# Patient Record
Sex: Female | Born: 1965 | Race: White | Hispanic: No | Marital: Married | Smoking: Current every day smoker
Health system: Southern US, Community
[De-identification: ages and names within clinical notes are randomized; demographics above are authoritative.]

## PROBLEM LIST (undated history)

## (undated) DIAGNOSIS — F32A Depression, unspecified: Secondary | ICD-10-CM

## (undated) DIAGNOSIS — K589 Irritable bowel syndrome without diarrhea: Secondary | ICD-10-CM

## (undated) DIAGNOSIS — E119 Type 2 diabetes mellitus without complications: Secondary | ICD-10-CM

## (undated) DIAGNOSIS — K2210 Ulcer of esophagus without bleeding: Secondary | ICD-10-CM

## (undated) DIAGNOSIS — D126 Benign neoplasm of colon, unspecified: Secondary | ICD-10-CM

## (undated) DIAGNOSIS — Z8719 Personal history of other diseases of the digestive system: Secondary | ICD-10-CM

## (undated) DIAGNOSIS — Z8711 Personal history of peptic ulcer disease: Secondary | ICD-10-CM

## (undated) DIAGNOSIS — K52839 Microscopic colitis, unspecified: Secondary | ICD-10-CM

## (undated) DIAGNOSIS — F329 Major depressive disorder, single episode, unspecified: Secondary | ICD-10-CM

## (undated) DIAGNOSIS — K219 Gastro-esophageal reflux disease without esophagitis: Secondary | ICD-10-CM

## (undated) HISTORY — DX: Personal history of other diseases of the digestive system: Z87.19

## (undated) HISTORY — DX: Irritable bowel syndrome, unspecified: K58.9

## (undated) HISTORY — PX: TUBAL LIGATION: SHX77

## (undated) HISTORY — DX: Ulcer of esophagus without bleeding: K22.10

## (undated) HISTORY — DX: Depression, unspecified: F32.A

## (undated) HISTORY — DX: Benign neoplasm of colon, unspecified: D12.6

## (undated) HISTORY — DX: Major depressive disorder, single episode, unspecified: F32.9

## (undated) HISTORY — PX: TONSILLECTOMY: SUR1361

## (undated) HISTORY — DX: Microscopic colitis, unspecified: K52.839

## (undated) HISTORY — PX: ABDOMINAL HYSTERECTOMY: SHX81

## (undated) HISTORY — DX: Gastro-esophageal reflux disease without esophagitis: K21.9

## (undated) HISTORY — DX: Personal history of peptic ulcer disease: Z87.11

---

## 2000-06-20 ENCOUNTER — Ambulatory Visit (HOSPITAL_COMMUNITY): Admission: RE | Admit: 2000-06-20 | Discharge: 2000-06-20 | Payer: Self-pay | Admitting: *Deleted

## 2002-06-23 ENCOUNTER — Ambulatory Visit (HOSPITAL_COMMUNITY): Admission: RE | Admit: 2002-06-23 | Discharge: 2002-06-23 | Payer: Self-pay | Admitting: Family Medicine

## 2002-06-23 ENCOUNTER — Encounter: Payer: Self-pay | Admitting: Family Medicine

## 2004-10-05 ENCOUNTER — Ambulatory Visit (HOSPITAL_COMMUNITY): Admission: RE | Admit: 2004-10-05 | Discharge: 2004-10-05 | Payer: Self-pay | Admitting: Obstetrics and Gynecology

## 2004-10-05 ENCOUNTER — Encounter: Payer: Self-pay | Admitting: Obstetrics and Gynecology

## 2005-05-24 ENCOUNTER — Ambulatory Visit (HOSPITAL_COMMUNITY): Admission: RE | Admit: 2005-05-24 | Discharge: 2005-05-24 | Payer: Self-pay | Admitting: Family Medicine

## 2005-09-21 ENCOUNTER — Emergency Department (HOSPITAL_COMMUNITY): Admission: EM | Admit: 2005-09-21 | Discharge: 2005-09-21 | Payer: Self-pay | Admitting: Emergency Medicine

## 2006-10-10 ENCOUNTER — Ambulatory Visit (HOSPITAL_COMMUNITY): Admission: RE | Admit: 2006-10-10 | Discharge: 2006-10-11 | Payer: Self-pay | Admitting: Obstetrics and Gynecology

## 2006-10-10 ENCOUNTER — Encounter (INDEPENDENT_AMBULATORY_CARE_PROVIDER_SITE_OTHER): Payer: Self-pay | Admitting: Obstetrics and Gynecology

## 2008-06-27 ENCOUNTER — Emergency Department (HOSPITAL_COMMUNITY): Admission: EM | Admit: 2008-06-27 | Discharge: 2008-06-27 | Payer: Self-pay | Admitting: Emergency Medicine

## 2008-12-12 ENCOUNTER — Encounter: Admission: RE | Admit: 2008-12-12 | Discharge: 2008-12-12 | Payer: Self-pay | Admitting: Family Medicine

## 2009-03-08 HISTORY — PX: APPENDECTOMY: SHX54

## 2009-03-28 ENCOUNTER — Encounter (INDEPENDENT_AMBULATORY_CARE_PROVIDER_SITE_OTHER): Payer: Self-pay | Admitting: General Surgery

## 2009-03-28 ENCOUNTER — Inpatient Hospital Stay (HOSPITAL_COMMUNITY): Admission: EM | Admit: 2009-03-28 | Discharge: 2009-04-01 | Payer: Self-pay | Admitting: Emergency Medicine

## 2009-10-20 ENCOUNTER — Ambulatory Visit: Payer: Self-pay | Admitting: Otolaryngology

## 2010-01-25 ENCOUNTER — Ambulatory Visit (HOSPITAL_COMMUNITY)
Admission: RE | Admit: 2010-01-25 | Discharge: 2010-01-25 | Payer: Self-pay | Source: Home / Self Care | Attending: Internal Medicine | Admitting: Internal Medicine

## 2010-02-11 ENCOUNTER — Emergency Department (HOSPITAL_COMMUNITY)
Admission: EM | Admit: 2010-02-11 | Discharge: 2010-02-11 | Payer: Self-pay | Source: Home / Self Care | Admitting: Emergency Medicine

## 2010-02-14 ENCOUNTER — Ambulatory Visit
Admission: RE | Admit: 2010-02-14 | Discharge: 2010-02-14 | Payer: Self-pay | Source: Home / Self Care | Attending: Gastroenterology | Admitting: Gastroenterology

## 2010-02-14 DIAGNOSIS — R109 Unspecified abdominal pain: Secondary | ICD-10-CM | POA: Insufficient documentation

## 2010-02-14 DIAGNOSIS — R197 Diarrhea, unspecified: Secondary | ICD-10-CM | POA: Insufficient documentation

## 2010-02-14 DIAGNOSIS — K589 Irritable bowel syndrome without diarrhea: Secondary | ICD-10-CM | POA: Insufficient documentation

## 2010-02-15 ENCOUNTER — Encounter: Payer: Self-pay | Admitting: Gastroenterology

## 2010-02-16 ENCOUNTER — Encounter: Payer: Self-pay | Admitting: Gastroenterology

## 2010-02-20 LAB — URINALYSIS, ROUTINE W REFLEX MICROSCOPIC
Bilirubin Urine: NEGATIVE
Ketones, ur: NEGATIVE mg/dL
Leukocytes, UA: NEGATIVE
Nitrite: NEGATIVE
Protein, ur: NEGATIVE mg/dL
Specific Gravity, Urine: <1.005 — ABNORMAL LOW (ref 1.005–1.030)
Urine Glucose, Fasting: NEGATIVE mg/dL
Urobilinogen, UA: 0.2 mg/dL (ref 0.0–1.0)
pH: 6 (ref 5.0–8.0)

## 2010-02-20 LAB — LIPASE, BLOOD: Lipase: 26 U/L (ref 11–59)

## 2010-02-20 LAB — COMPREHENSIVE METABOLIC PANEL
ALT: 27 U/L (ref 0–35)
AST: 21 U/L (ref 0–37)
Albumin: 4.5 g/dL (ref 3.5–5.2)
Alkaline Phosphatase: 82 U/L (ref 39–117)
BUN: 11 mg/dL (ref 6–23)
CO2: 27 mEq/L (ref 19–32)
Calcium: 9.6 mg/dL (ref 8.4–10.5)
Chloride: 102 mEq/L (ref 96–112)
Creatinine, Ser: 0.61 mg/dL (ref 0.4–1.2)
GFR calc Af Amer: >60 mL/min (ref >60)
GFR calc non Af Amer: >60 mL/min (ref >60)
Glucose, Bld: 101 mg/dL — ABNORMAL HIGH (ref 70–99)
Potassium: 4 mEq/L (ref 3.5–5.1)
Sodium: 136 mEq/L (ref 135–145)
Total Bilirubin: 0.4 mg/dL (ref 0.3–1.2)
Total Protein: 7.8 g/dL (ref 6.0–8.3)

## 2010-02-20 LAB — POCT PREGNANCY, URINE: Preg Test, Ur: NEGATIVE

## 2010-02-20 LAB — CONVERTED CEMR LAB
IgA: 128 mg/dL (ref 68–378)
Tissue Transglutaminase Ab, IgA: 2.2 units (ref <20)

## 2010-02-20 LAB — DIFFERENTIAL
Basophils Absolute: 0.1 10*3/uL (ref 0.0–0.1)
Basophils Relative: 1 % (ref 0–1)
Eosinophils Absolute: 0.2 10*3/uL (ref 0.0–0.7)
Eosinophils Relative: 2 % (ref 0–5)
Lymphocytes Relative: 23 % (ref 12–46)
Lymphs Abs: 1.9 10*3/uL (ref 0.7–4.0)
Monocytes Absolute: 0.4 10*3/uL (ref 0.1–1.0)
Monocytes Relative: 4 % (ref 3–12)
Neutro Abs: 5.9 10*3/uL (ref 1.7–7.7)
Neutrophils Relative %: 71 % (ref 43–77)

## 2010-02-20 LAB — CBC
HCT: 38.6 % (ref 36.0–46.0)
Hemoglobin: 14.1 g/dL (ref 12.0–15.0)
MCH: 31.7 pg (ref 26.0–34.0)
MCHC: 36.5 g/dL — ABNORMAL HIGH (ref 30.0–36.0)
MCV: 86.7 fL (ref 78.0–100.0)
Platelets: 246 10*3/uL (ref 150–400)
RBC: 4.45 MIL/uL (ref 3.87–5.11)
RDW: 12.5 % (ref 11.5–15.5)
WBC: 8.3 10*3/uL (ref 4.0–10.5)

## 2010-02-20 LAB — URINE MICROSCOPIC-ADD ON

## 2010-03-09 NOTE — Assessment & Plan Note (Signed)
Summary: diarrhea and abd pain x 6 weeks/ss   Visit Type:  Initial Consult Referring Provider:  [facility] Primary Care Provider:  Sherwood Gambler  CC:  diarrhea and abd pain.  History of Present Illness: [name] presents today in consultation regarding abdominal pain and diarrhea since early November. Presented to ED Feb 11, 2010. Up to 6-8 stools per day at its worst, now down to three-four. No brbpr, no melena. Postprandial diarrhea. +abdominal cramping preceeds diarrhea. reports "bee sting" pain, "numbness", intermittently lower abdomen, s/p appy secondary to gangrene Feb 2011. Prior to November had 1-2 BMs per day. Prior to hysterectomy would have severe diarrhea during pre-menstrual period. Not on any recent abx. No new meds. No travel. No sick contacts. city water. No changes in diet. Switched to third shift in August. +stressors with son. Reports +nausea on Saturday. Hx of IBS-D with excellent results on Levbid in past. Last seen by [place] in 2002. Had flex sig in 2000 which showed normal rectum, normal left colonic mucosa to 50 cm.   In ED, [place] Of abd wnl, all other labs (CBC, CMP, lipase, all essentially normal. no anemia or abnormal LFTs, see e-chart)  Current Medications (verified): 1)  Lomotil 2.5-0.025 Mg Tabs (Diphenoxylate-Atropine) .... As Needed 2)  Imodium Advanced 2-125 Mg Tabs (Loperamide-Simethicone) .... As Needed 3)  Ibuprofen 200 Mg Tabs (Ibuprofen) .... As Needed  Allergies (verified): No Known Drug Allergies  Past History:  Past Medical History: IBS-D  Past Surgical History: Open appendectomy Feb 2011 Dr. Malvin Johns with delayed closure Hysterectomy  tubal ligation tonsillectomy  Family History: Mother:early DM, heart disease, living Father:healthy, living No FH of Colon Cancer:  Social History: Occupation: Research officer, political party, third shift Married 2 children Patient currently smokes. 1/2ppd Alcohol Use - yes, seldom Illicit Drug Use - no Smoking Status:  current Drug  Use:  no  Review of Systems General:  Denies fever, chills, and anorexia. Eyes:  Denies blurring, diplopia, irritation, and discharge. ENT:  Denies earache, sore throat, hoarseness, and difficulty swallowing. CV:  Denies chest pains, syncope, and dyspnea on exertion. Resp:  Denies dyspnea at rest and wheezing. GI:  Complains of diarrhea and change in bowel habits; denies difficulty swallowing, pain on swallowing, nausea, indigestion/heartburn, bloody BM's, and black BMs. GU:  Denies urinary burning and urinary frequency. MS:  Denies joint pain / LOM, joint swelling, and joint stiffness. Derm:  Denies rash, itching, and dry skin. Neuro:  Denies weakness and syncope. Psych:  Denies depression and anxiety. Endo:  Denies cold intolerance and heat intolerance.  Vital Signs:  Patient profile:   45 year old female Height:      65 inches Weight:      153 pounds BMI:     25.55 Temp:     97.9 degrees F oral Pulse rate:   80 / minute BP sitting:   118 / 80  (left arm) Cuff size:   regular  Vitals Entered By: Hendricks Limes LPN (February 14, 2010 9:33 AM)  Physical Exam  General:  Well developed, well nourished, no acute distress. Head:  Normocephalic and atraumatic. Eyes:  sclera without icterus Mouth:  No deformity or lesions, dentition normal. Neck:  Supple; no masses or thyromegaly. Lungs:  Clear throughout to auscultation. Heart:  Regular rate and rhythm; no murmurs, rubs,  or bruits. Abdomen:  Soft, nontender and nondistended. No masses, hepatosplenomegaly or hernias noted. Normal bowel sounds. no rebound or guarding noted.  Msk:  Symmetrical with no gross deformities. Normal posture. Pulses:  Normal pulses noted. Extremities:  No clubbing, cyanosis, edema or deformities noted. Neurologic:  Alert and  oriented x4;  grossly normal neurologically. Skin:  Intact without significant lesions or rashes. Cervical Nodes:  No significant cervical adenopathy. Psych:  Alert and cooperative.  Normal mood and affect.  Impression & Recommendations:  Problem # 1:  IRRITABLE BOWEL SYNDROME (ICD-14.92) 45 year old female who was last seen by our practice in 2002, hx of IBS, diarrhea prodiminent. Noticed increase in postprandial diarrhea since before November, no brbpr or melena. abdominal cramping precedes BM, with relief afterward. Recent ED visit with normal LFTs, CBC, lipase, [place]. No anemia. Has done very well on levbid in past. Symptoms consistent with known hx of IBS-D, no factors warranting colonoscopy at this point. Likelihood of celiac disease low but will check labs.   Fiber supplement of choice daily (supplements given) Probiotic daily (samples given) Stop lomotil, begin Levbid 1 tab by mouth two times a day.  Return in 3 mos for reassessment.   Orders: T-igA (16109) T-Tissue Transglutamase Ab IgA (60454-09811) T-Celiac Disease Ab Evaluation (8002) Consultation Level III (91478)  Problem # 2:  ABDOMINAL PAIN, MILD (ICD-789.00)  lower abdominal mild discomfort described as "bee stings", numbness, along surgical scar from open appy, consistent with post-surgical neuropathic pain. Reported as mild and does not interfere with daily functions. Continue to monitor, likely will improve over time. pt aware.  Orders: Consultation Level III (29562)  Patient Instructions: 1)  Take a fiber supplement daily. Samples given 2)  Probiotic daily. Samples given 3)  Begin Levbid twice/day, this will be called into your pharmacy 4)  Stop lomotil and immodium 5)  The medication list was reviewed and reconciled.  All changed / newly prescribed medications were explained.  A complete medication list was provided to the patient / caregiver.  Prescriptions: LEVBID 0.375 MG XR12H-TAB (HYOSCYAMINE SULFATE) take 1 by mouth two times a day  #60 x 3   Entered and Authorized by:   Gerrit Halls NP   Signed by:   Gerrit Halls NP on 02/14/2010   Method used:   Faxed to ...       [facility] [address]  [phone]* (retail)       [address] [place]  [address]       Ph: [phone]       Fax: [phone]   RxID:   [id number]

## 2010-03-09 NOTE — Letter (Signed)
Summary: REFERRAL FROM [facility]  REFERRAL FROM [facility]   Imported By: Rexene Alberts 02/15/2010 15:36:35  _____________________________________________________________________  External Attachment:    Type:   Image     Comment:   External Document

## 2010-04-14 ENCOUNTER — Encounter: Payer: Self-pay | Admitting: Gastroenterology

## 2010-04-17 ENCOUNTER — Ambulatory Visit (INDEPENDENT_AMBULATORY_CARE_PROVIDER_SITE_OTHER): Payer: PRIVATE HEALTH INSURANCE | Admitting: Gastroenterology

## 2010-04-17 ENCOUNTER — Encounter: Payer: Self-pay | Admitting: Internal Medicine

## 2010-04-17 ENCOUNTER — Encounter: Payer: Self-pay | Admitting: Gastroenterology

## 2010-04-17 DIAGNOSIS — K219 Gastro-esophageal reflux disease without esophagitis: Secondary | ICD-10-CM

## 2010-04-17 DIAGNOSIS — R197 Diarrhea, unspecified: Secondary | ICD-10-CM

## 2010-04-17 DIAGNOSIS — K589 Irritable bowel syndrome without diarrhea: Secondary | ICD-10-CM

## 2010-04-25 NOTE — Letter (Signed)
Summary: [facility] ORDER  [facility] ORDER   Imported By: Ave Filter 04/17/2010 11:40:22  _____________________________________________________________________  External Attachment:    Type:   Image     Comment:   External Document

## 2010-04-25 NOTE — Assessment & Plan Note (Signed)
Summary: persistant diarrhea   Vital Signs:  Patient profile:   45 year old female Height:      65 inches Weight:      155 pounds BMI:     25.89 Temp:     98 degrees F oral Pulse rate:   84 / minute BP sitting:   118 / 76  (left arm)  Vitals Entered By: Carolan Clines LPN (April 17, 2010 9:44 AM)  Visit Type:  Follow-up Visit Primary Care Provider:  Sherwood Gambler   History of Present Illness: Ms. Knight is a 45 year old Caucasian female with hx of IBS-D. Was started on Levbid early January at office visit, as she reports excellent reponse to this in the past. Noticed a difference for a few days, but now is continuing 8-10 watery loose stools/day. Actually went to [facility] to see Dr. McGough feb 25th, was given dicycolomine. this helped a few days as well, but diarrhea continued. Has immediate postprandial diarrhea. Loose, watery. By end of day has been noticing small traces of blood. Does have lack of appetite. Some nausea. +mid-abdominal discomfort, like a "toothache in stomach". Not relieved by BM. Fiber worsens. On probiotic.  Lately has been taking ibuprofen about every day now secondary to abdominal "achiness". Reports that when she eats, "feels like fire". Not on a PPI. No dysphagia/odynophagia. Wt is stable, but does report mild nausea intermittently, some lack of appetite secondary to ongoing diarrhea.   Current Medications (verified): 1)  Lomotil 2.5-0.025 Mg Tabs (Diphenoxylate-Atropine) .... As Needed 2)  Imodium Advanced 2-125 Mg Tabs (Loperamide-Simethicone) .... As Needed 3)  Ibuprofen 200 Mg Tabs (Ibuprofen) .... As Needed 4)  Levbid 0.375 Mg Xr12h-Tab (Hyoscyamine Sulfate) .... Take 1 By Mouth Two Times A Day 5)  Bentyl 20 Mg Tabs (Dicyclomine Hcl) .... Take One Qid 6)  Multi-Enzyme  Tabs (Digestive Enzymes) .... Take Three With Each Meal  Allergies (verified): No Known Drug Allergies  Past History:  Past Medical History: Last updated: 02/14/2010 IBS-D  Past Surgical  History: Last updated: 02/14/2010 Open appendectomy Feb 2011 Dr. Malvin Johns with delayed closure Hysterectomy  tubal ligation tonsillectomy  Family History: Last updated: 02/14/2010 Mother:early DM, heart disease, living Father:healthy, living No FH of Colon Cancer:  Social History: Last updated: 02/14/2010 Occupation: postal service, third shift Married 2 children Patient currently smokes. 1/2ppd Alcohol Use - yes, seldom Illicit Drug Use - no  Review of Systems General:  Denies fever, chills, and anorexia. Eyes:  Denies blurring, irritation, and discharge. ENT:  Denies sore throat, hoarseness, and difficulty swallowing. CV:  Denies chest pains and syncope. Resp:  Denies dyspnea at rest and wheezing. GI:  See HPI. GU:  Denies urinary burning and urinary frequency. MS:  Denies joint pain / LOM, joint swelling, and joint stiffness. Derm:  Denies rash, itching, and dry skin. Neuro:  Denies weakness and syncope. Psych:  Denies depression and anxiety. Endo:  Denies cold intolerance and heat intolerance.  Physical Exam  General:  Well developed, well nourished, no acute distress. Head:  Normocephalic and atraumatic. Eyes:  sclera without icterus Mouth:  No deformity or lesions, dentition normal. Lungs:  Clear throughout to auscultation. Heart:  Regular rate and rhythm; no murmurs, rubs,  or bruits. Abdomen:  +BS, soft, mildly tender to palpation mid-abdomen. No rebound or guarding. No HSM.  Msk:  Symmetrical with no gross deformities. Normal posture. Pulses:  Normal pulses noted. Extremities:  No clubbing, cyanosis, edema or deformities noted. Neurologic:  Alert and  oriented x4;  grossly  normal neurologically. Skin:  Intact without significant lesions or rashes. Psych:  Alert and cooperative. Normal mood and affect.   Impression & Recommendations:  Problem # 1:  DIARRHEA (ICD-65.28) 45 year old pleasant Caucasian female with hx of IBS-D in past that had responded to  Levbid in remote past. noticed a few days improvement, but diarrhea continues to worsen despite medication. Was switched to bentyl, yet still no adequate response. Fiber worsens. On probiotic. Small amt of hematochezia towards end of day, likely secondary to benign anorectal source/irritation. No sick contacts, recent abx, change in meds. Has city water. Doubt infectious process, but since no improvement in diarrhea, will obtain stool studies and assess further with colonoscopy.  Stool studies as outlined below TCS with Dr. Jena Gauss in near future: the R/B/A have been discussed in detail. Pt states understanding and desires to proceed.  May take pepto bismol or kaopectate as needed (immodium not helping) Further rec's to follow   Orders: T-C diff by PCR (16109) T-Culture, Stool ([id number]) T-Stool Giardia / Crypto- EIA (60454) T-Fecal WBC (09811-91478) Est. Patient Level II (29562)  Problem # 2:  GERD (ICD-530.81)  Increasing reflux, indigestion, epigastric discomfort over past few months. No PPI. No odynophagia/dysphagia. Will trial Prilosec.  Prilosec samples #15 given to pt rx sent to pharmacy of choice  Orders: Est. Patient Level II (13086) Prescriptions: PRILOSEC 20 MG CPDR (OMEPRAZOLE) take 1 by mouth 30 minutes before first meal of day  #31 x 0   Entered and Authorized by:   Gerrit Halls NP   Signed by:   Gerrit Halls NP on 04/17/2010   Method used:   Faxed to ...       [facility] [address] [phone]* (retail)       [address] [place], [place]  [address]       Ph: [phone]       Fax: [phone]   RxID:   [id number]    Orders Added: 1)  T-C diff by PCR [81755] 2)  T-Culture, Stool [[id number]] 3)  T-Stool Giardia / Crypto- EIA [87328] 4)  T-Fecal WBC [[id number]] 5)  Est. Patient Level II [66063]

## 2010-04-26 ENCOUNTER — Encounter: Payer: PRIVATE HEALTH INSURANCE | Admitting: Internal Medicine

## 2010-04-26 ENCOUNTER — Ambulatory Visit (HOSPITAL_COMMUNITY)
Admission: RE | Admit: 2010-04-26 | Discharge: 2010-04-26 | Disposition: A | Discharge status: Home / Self Care | Payer: 59 | Source: Ambulatory Visit | Attending: Internal Medicine | Admitting: Internal Medicine

## 2010-04-26 ENCOUNTER — Other Ambulatory Visit: Payer: Self-pay | Admitting: Internal Medicine

## 2010-04-26 DIAGNOSIS — K52839 Microscopic colitis, unspecified: Secondary | ICD-10-CM

## 2010-04-26 DIAGNOSIS — K219 Gastro-esophageal reflux disease without esophagitis: Secondary | ICD-10-CM | POA: Insufficient documentation

## 2010-04-26 DIAGNOSIS — D126 Benign neoplasm of colon, unspecified: Secondary | ICD-10-CM

## 2010-04-26 DIAGNOSIS — K5289 Other specified noninfective gastroenteritis and colitis: Secondary | ICD-10-CM

## 2010-04-26 DIAGNOSIS — K2100 Gastro-esophageal reflux disease with esophagitis, without bleeding: Secondary | ICD-10-CM | POA: Insufficient documentation

## 2010-04-26 DIAGNOSIS — R197 Diarrhea, unspecified: Secondary | ICD-10-CM

## 2010-04-26 DIAGNOSIS — K5730 Diverticulosis of large intestine without perforation or abscess without bleeding: Secondary | ICD-10-CM | POA: Insufficient documentation

## 2010-04-26 DIAGNOSIS — R1310 Dysphagia, unspecified: Secondary | ICD-10-CM

## 2010-04-26 DIAGNOSIS — K2210 Ulcer of esophagus without bleeding: Secondary | ICD-10-CM

## 2010-04-26 HISTORY — DX: Benign neoplasm of colon, unspecified: D12.6

## 2010-04-26 HISTORY — DX: Ulcer of esophagus without bleeding: K22.10

## 2010-04-26 HISTORY — DX: Microscopic colitis, unspecified: K52.839

## 2010-04-26 LAB — BASIC METABOLIC PANEL
BUN: 7 mg/dL (ref 6–23)
BUN: 9 mg/dL (ref 6–23)
CO2: 24 mEq/L (ref 19–32)
CO2: 26 mEq/L (ref 19–32)
Calcium: 9 mg/dL (ref 8.4–10.5)
Chloride: 101 mEq/L (ref 96–112)
Chloride: 107 mEq/L (ref 96–112)
Creatinine, Ser: 0.58 mg/dL (ref 0.4–1.2)
GFR calc Af Amer: >60 mL/min (ref >60)
GFR calc Af Amer: >60 mL/min (ref >60)
GFR calc non Af Amer: >60 mL/min (ref >60)
GFR calc non Af Amer: >60 mL/min (ref >60)
Glucose, Bld: 132 mg/dL — ABNORMAL HIGH (ref 70–99)
Potassium: 3.5 mEq/L (ref 3.5–5.1)
Potassium: 3.7 mEq/L (ref 3.5–5.1)
Potassium: 3.9 mEq/L (ref 3.5–5.1)
Sodium: 134 mEq/L — ABNORMAL LOW (ref 135–145)
Sodium: 136 mEq/L (ref 135–145)

## 2010-04-26 LAB — CBC
HCT: 35 % — ABNORMAL LOW (ref 36.0–46.0)
HCT: 36.2 % (ref 36.0–46.0)
HCT: 39.1 % (ref 36.0–46.0)
Hemoglobin: 12 g/dL (ref 12.0–15.0)
Hemoglobin: 13.7 g/dL (ref 12.0–15.0)
MCHC: 34.2 g/dL (ref 30.0–36.0)
MCHC: 35 g/dL (ref 30.0–36.0)
MCV: 90.2 fL (ref 78.0–100.0)
MCV: 91.8 fL (ref 78.0–100.0)
Platelets: 216 10*3/uL (ref 150–400)
Platelets: 231 10*3/uL (ref 150–400)
RBC: 3.82 MIL/uL — ABNORMAL LOW (ref 3.87–5.11)
RBC: 4.33 MIL/uL (ref 3.87–5.11)
RDW: 13 % (ref 11.5–15.5)
WBC: 14.9 10*3/uL — ABNORMAL HIGH (ref 4.0–10.5)
WBC: 20.1 10*3/uL — ABNORMAL HIGH (ref 4.0–10.5)

## 2010-04-26 LAB — URINALYSIS, ROUTINE W REFLEX MICROSCOPIC
Bilirubin Urine: NEGATIVE
Glucose, UA: NEGATIVE mg/dL
Ketones, ur: NEGATIVE mg/dL
Leukocytes, UA: NEGATIVE
Nitrite: NEGATIVE
Protein, ur: 30 mg/dL — AB
Specific Gravity, Urine: 1.025 (ref 1.005–1.030)
Urobilinogen, UA: 0.2 mg/dL (ref 0.0–1.0)
pH: 6 (ref 5.0–8.0)

## 2010-04-26 LAB — DIFFERENTIAL
Basophils Absolute: 0 10*3/uL (ref 0.0–0.1)
Basophils Relative: 0 % (ref 0–1)
Basophils Relative: 0 % (ref 0–1)
Eosinophils Absolute: 0 10*3/uL (ref 0.0–0.7)
Eosinophils Absolute: 0.1 10*3/uL (ref 0.0–0.7)
Eosinophils Relative: 0 % (ref 0–5)
Eosinophils Relative: 0 % (ref 0–5)
Lymphocytes Relative: 4 % — ABNORMAL LOW (ref 12–46)
Lymphocytes Relative: 6 % — ABNORMAL LOW (ref 12–46)
Lymphs Abs: 0.8 10*3/uL (ref 0.7–4.0)
Lymphs Abs: 0.9 10*3/uL (ref 0.7–4.0)
Lymphs Abs: 1.2 10*3/uL (ref 0.7–4.0)
Monocytes Absolute: 0.3 10*3/uL (ref 0.1–1.0)
Monocytes Absolute: 0.7 10*3/uL (ref 0.1–1.0)
Monocytes Relative: 2 % — ABNORMAL LOW (ref 3–12)
Monocytes Relative: 6 % (ref 3–12)
Neutro Abs: 13.1 10*3/uL — ABNORMAL HIGH (ref 1.7–7.7)
Neutro Abs: 19 10*3/uL — ABNORMAL HIGH (ref 1.7–7.7)
Neutrophils Relative %: 88 % — ABNORMAL HIGH (ref 43–77)
Neutrophils Relative %: 94 % — ABNORMAL HIGH (ref 43–77)

## 2010-04-26 LAB — TYPE AND SCREEN
ABO/RH(D): O POS
Antibody Screen: NEGATIVE

## 2010-04-26 LAB — URINE MICROSCOPIC-ADD ON

## 2010-04-28 ENCOUNTER — Encounter (INDEPENDENT_AMBULATORY_CARE_PROVIDER_SITE_OTHER): Payer: Self-pay | Admitting: *Deleted

## 2010-04-29 ENCOUNTER — Encounter: Payer: Self-pay | Admitting: Internal Medicine

## 2010-04-29 NOTE — Progress Notes (Signed)
Addended by: Roetta Sessions on: 04/29/2010 01:11 PM   Modules accepted: Orders

## 2010-05-01 MED ORDER — BUDESONIDE 3 MG PO CP24: 3.0000 mg | ORAL_CAPSULE | Freq: Two times a day (BID) | ORAL | Status: AC

## 2010-05-01 NOTE — Progress Notes (Signed)
Addended by: Hendricks Limes on: 05/01/2010 10:24 AM   Modules accepted: Orders

## 2010-05-02 NOTE — Op Note (Signed)
NAMELATINA, FRANK                 ACCOUNT NO.:  [id number]  MEDICAL RECORD NO.:  [id number]           PATIENT TYPE:  O  LOCATION:  DAYP                          FACILITY:  [facility]  PHYSICIAN:  R. Roetta Sessions, M.D. DATE OF BIRTH:  10/06/1965  DATE OF PROCEDURE:  04/26/2010 DATE OF DISCHARGE:                              OPERATIVE REPORT   SURGEON:  R. Roetta Sessions, MD  INDICATIONS FOR PROCEDURE:  A 45 year old lady with chronic diarrhea, worsening reflux symptoms, epigastric pain, vague intermittent esophageal dysphagia, only recently started Prilosec, will be taken for 2 days.  Recent set of stool studies at our office came back negative, also celiac panel came back negative.  Colonoscopy is now being done to further without evaluate chronic diarrhea after her interview and reviewing her symptoms from the office.  She has significant dyspepsia, GERD, and vague solid food dysphagia.  So, I consequently offered this lady EGD at the same time as colonoscopy.  I talked about the risks, benefits, limitations, alternatives, and imponderables of both EGD and colonoscopy.  Questions have been answered.  She is agreeable.  PROCEDURE NOTE:  O2 saturation, blood pressure, pulse, respirations were monitored throughout the entire procedures.  CONSCIOUS SEDATION: 1. Versed 5 mg IV. 2. Demerol 125 mg IV in divided doses. 3. Cetacaine spray for topical pharyngeal anesthesia.  INSTRUMENT:  Pentax video chip system.  FINDINGS:  EGD examination of the tubular esophagus revealed distal esophageal erosions straddling the 5 cm above the GE junction.  There was a Barrett's esophagus.  The tubular esophagus was patent through the EG junction.  Stomach:  Gastric cavity was empty and insufflated well with air.  Thorough examination of gastric mucosa including retroflexion of the proximal stomach and esophagogastric junction demonstrated a small hiatal hernia and multiple deep antral erosions with  some having dark and black eschar overlying the erosions.  Please see photos.  This appeared to be a benign process.  The pylorus was patent and easily traversed.  Examination of the bulb and second portion revealed extension of these erosions through the bulb into the proximal second portion of duodenum.  Please see photos.  Otherwise, D1 and D2 appeared normal.  THERAPEUTIC/DIAGNOSTIC MANEUVERS PERFORMED:  Biopsies were taken for histologic study and check for H. pylori etc.  The esophagus was not dilated.  The patient tolerated the procedure well and was prepared for colonoscopy.  The Pentax pediatric colonoscope was utilized for procedure.  Digital rectal exam revealed no abnormalities. Endoscopic findings:  Prep was good.  Colon:  Colonic mucosa was surveyed from the rectosigmoid junction through the left transverse, right colon to the appendiceal orifice, ileocecal valve/cecum.  These structures were well seen and photographed for the record.  Terminal ileum was intubated 10 cm.  From this level, scope was slowly cautiously withdrawn.  All previously mentioned mucosal surfaces were again seen. 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  There was a 1.5 cm multilobulated ulcerated polyp on the long stalk.  Please see photos.  It  was engaged with the snare and removed with one pass, even though the snare was felt to be in place just below the head of the polyp and a good 8 or slightly more inch of the stalk was cut as well.  There was good hemostasis.  I elected to go ahead and placed two resolution clips on the retracting polyp stalk to ensure good hemostasis and to help maintain the integrity of the colonic wall.  The remainder of the colonic mucosa appeared normal.  Scope was pulled down into the rectum.  Thorough examination of the rectal mucosa including  retroflexed view of the anal verge demonstrated no abnormalities.  The polyp was removed via the [employer]. The scope was reintroduced up to the level of polypectomy site for clipping.  Aggregate cecal withdrawal time was 23 minutes.  The patient tolerated both procedures well.  IMPRESSION: 1. Circumferential distal esophageal erosions consistent with mild     erosive reflux esophagitis, patent esophagus, small hiatal hernia,     numerous antral erosions as described above, suspect inset effect     in parts, status biopsy, patent pylorus, bulbar and D2 erosions as     described above.  Colonoscopy findings, normal rectum. 2. 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  RECOMMENDATIONS: 1. Stop all nonsteroidals (Aleve, Advil, and aspirin etc.). 2. Stop Prilosec. 3. Begin Dexilant 60 mg orally daily (the patient to go by my office     for 2 weeks by free samples). 4. No MRI until clips known to have the passed. 5. Follow up on path. 6. GERD literature provided to Mr. Mruk. 7. Further recommendations to follow.  Please note, I failed to     dictate in the colonoscopy procedure note that I did take segmental     biopsies of the descending and sigmoid segments to evaluate for     microscopic colitis.     Jonathon Bellows, M.D.     RMR/MEDQ  D:  04/26/2010  T:  04/26/2010  Job:  [id number]  cc:   Madelin Rear. Sherwood Gambler, MD Fax: [phone]  Electronically Signed by Lorrin Goodell M.D. on 05/02/2010 11:23:42 AM

## 2010-05-04 ENCOUNTER — Encounter (INDEPENDENT_AMBULATORY_CARE_PROVIDER_SITE_OTHER): Payer: Self-pay | Admitting: *Deleted

## 2010-05-04 NOTE — Letter (Addendum)
Summary: Out of Work Note  [facility]  [address]   [place], [place] [address]   Phone: [phone]  Fax: [phone]    04/28/2010  TO: WHOM IT MAY CONCERN  RE: Monica Soto [address] [place] 08-15-1965       The above named individual is currently under my care and will be out of work    FROM: 04/27/2010   THROUGH: 04/28/2010    REASON:    MAY RETURN ON:     If you have any further questions or need additional information, please call.     Sincerely,     [facility] R. Roetta Sessions, M.D.    Jonette Eva, M.D. Lorenza Burton, FNP-BC    Tana Coast, PA-C Phone: [phone]    Fax: [phone]

## 2010-05-04 NOTE — Letter (Addendum)
Summary: Out of Work Note  [facility]  [address]   [place], [place] [address]   Phone: [phone]  Fax: [phone]    04/28/2010  TO: WHOM IT MAY CONCERN  RE: Monica Soto [address] [place] 01-26-1966       The above named individual is currently under my care and will be out of work    FROM: 04/27/2010     THROUGH:  04/28/2010    REASON: PROCEDURE    MAY RETURN ON:  04/28/2010     If you have any further questions or need additional information, please call.     Sincerely,     [facility] R. Roetta Sessions, M.D.    Jonette Eva, M.D. Lorenza Burton, FNP-BC    Tana Coast, PA-C Phone: [phone]    Fax: [phone]

## 2010-05-09 NOTE — Letter (Signed)
Summary: Out of Work Note  [facility]  [address]   [place], [place] [address]   Phone: [phone]  Fax: [phone]    05/04/2010  TO: WHOM IT MAY CONCERN  RE: Monica Soto [address] [place] 04/09/1965       The above named individual is currently under my care and will be out of work    FROM: 05/04/2010   THROUGH:    REASON:  procedure    MAY RETURN ON:  05/04/2010     If you have any further questions or need additional information, please call.     Sincerely,     [facility] R. Roetta Sessions, M.D.    Jonette Eva, M.D. Lorenza Burton, FNP-BC    Tana Coast, PA-C Phone: [phone]    Fax: [phone]

## 2010-05-15 ENCOUNTER — Other Ambulatory Visit: Payer: Self-pay

## 2010-05-16 ENCOUNTER — Other Ambulatory Visit: Payer: Self-pay | Admitting: Gastroenterology

## 2010-05-16 LAB — COMPREHENSIVE METABOLIC PANEL
AST: 36 U/L (ref 0–37)
Albumin: 3.6 g/dL (ref 3.5–5.2)
BUN: 5 mg/dL — ABNORMAL LOW (ref 6–23)
CO2: 29 mEq/L (ref 19–32)
Calcium: 9 mg/dL (ref 8.4–10.5)
Creatinine, Ser: 0.5 mg/dL (ref 0.4–1.2)
GFR calc Af Amer: >60 mL/min (ref >60)
GFR calc non Af Amer: >60 mL/min (ref >60)
Total Bilirubin: 0.4 mg/dL (ref 0.3–1.2)

## 2010-05-16 LAB — CBC
HCT: 37.6 % (ref 36.0–46.0)
MCHC: 35.8 g/dL (ref 30.0–36.0)
MCV: 92.1 fL (ref 78.0–100.0)
Platelets: 199 10*3/uL (ref 150–400)

## 2010-05-16 LAB — DIFFERENTIAL
Basophils Absolute: 0 10*3/uL (ref 0.0–0.1)
Eosinophils Relative: 1 % (ref 0–5)
Lymphocytes Relative: 18 % (ref 12–46)
Lymphs Abs: 1.4 10*3/uL (ref 0.7–4.0)
Neutro Abs: 6.2 10*3/uL (ref 1.7–7.7)

## 2010-05-16 LAB — MONONUCLEOSIS SCREEN: Mono Screen: NEGATIVE

## 2010-05-16 MED ORDER — DEXLANSOPRAZOLE 60 MG PO CPDR: 60.0000 mg | DELAYED_RELEASE_CAPSULE | Freq: Every day | ORAL | Status: DC

## 2010-05-30 ENCOUNTER — Ambulatory Visit (INDEPENDENT_AMBULATORY_CARE_PROVIDER_SITE_OTHER): Payer: PRIVATE HEALTH INSURANCE | Admitting: Urgent Care

## 2010-05-30 ENCOUNTER — Encounter: Payer: Self-pay | Admitting: Urgent Care

## 2010-05-30 VITALS — BP 118/79 | HR 75 | Temp 97.7°F | Ht 65.0 in | Wt 153.4 lb

## 2010-05-30 DIAGNOSIS — K219 Gastro-esophageal reflux disease without esophagitis: Secondary | ICD-10-CM

## 2010-05-30 DIAGNOSIS — K52832 Lymphocytic colitis: Secondary | ICD-10-CM | POA: Insufficient documentation

## 2010-05-30 DIAGNOSIS — K5289 Other specified noninfective gastroenteritis and colitis: Secondary | ICD-10-CM

## 2010-05-30 NOTE — Progress Notes (Signed)
Primary Care Physician:  Kirk Ruths, MD Primary Gastroenterologist:  Dr. Jena Gauss  Chief Complaint  Patient presents with  . Follow-up    doing ok, has flare ups sometimes    HPI:  Monica Soto is a 45 y.o. 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  She has had" 6 mg daily for approximately one month. She is doing much better. She has bowel movements anywhere from 2-3 times per day, which is nodular than the 8-12 bowel movements she was having previously. She denies abdominal pain at this time. She did have it to her adenoma removed at the same time as her colonoscopy. She also had an EGD which showed erosive reflux esophagitis. She denies any heartburn or indigestion at this time. She is taking the dexilant 60 mg daily. She denies any further NSAID use where she previously been taking NSAIDs on a regular basis for headaches. She has failed omeprazole previously.    Past Surgical History  Procedure Date  . Appendectomy 2/11    Dr. Malvin Johns with a delayed closure  . Tubal ligation   . Tonsillectomy     Current Outpatient Prescriptions  Medication Sig Dispense Refill  . budesonide (ENTOCORT EC) 3 MG 24 hr capsule Take 1 capsule (3 mg total) by mouth 2 (two) times daily.  60 capsule  2  . dexlansoprazole (DEXILANT) 60 MG capsule Take 1 capsule (60 mg total) by mouth daily.  30 capsule  11  . Loperamide-Simethicone (IMODIUM MULTI-SYMPTOM RELIEF) 2-125 MG TABS Take by mouth as needed.        . diphenoxylate-atropine (LOMOTIL) 2.5-0.025 MG per tablet Take 1 tablet by mouth as needed.        . hyoscyamine (LEVBID) 0.375 MG 12 hr tablet Take 0.375 mg by mouth 2 (two) times daily as needed.       Marland Kitchen ibuprofen (ADVIL,MOTRIN) 200 MG tablet Take 200 mg by mouth every 6 (six) hours as needed.          Allergies as of 05/30/2010  . (No Known Allergies)    Family History: There is no known family history of colorectal carcinoma , liver disease, or inflammatory bowel disease.     Problem Relation Age of Onset  . Diabetes Mother   . Coronary artery disease Mother     History   Social History  . Marital Status: Married    Spouse Name: N/A    Number of Children: N/A  . Years of Education: N/A   Occupational History  .  [employer]    Third shift   Social History Main Topics  . Smoking status: Current Everyday Smoker -- 0.5 packs/day  . Smokeless tobacco: Not on file  . Alcohol Use: Yes     seldom  . Drug Use: No  . Sexually Active: Not on file   Review of Systems: Gen: Denies any fever, chills, sweats, anorexia, fatigue, weakness, malaise, weight loss, and sleep disorder CV: Denies chest pain, angina, palpitations, syncope, orthopnea, PND, peripheral edema, and claudication. Resp: Denies dyspnea at rest, dyspnea with exercise, cough, sputum, wheezing, coughing up blood, and pleurisy. GI: Denies vomiting blood, jaundice, and fecal incontinence.   Denies dysphagia or odynophagia. Derm: Denies rash, itching, dry skin, hives, moles, warts, or unhealing ulcers.  Psych: Denies depression, anxiety, memory loss, suicidal ideation, hallucinations, paranoia, and confusion. Heme: Denies bruising, bleeding, and enlarged lymph nodes.  Physical Exam: BP 118/79  Pulse 75  Temp(Src) 97.7 F (36.5 C) (Tympanic)  Ht  5\' 5"  (1.651 m)  Wt 153 lb 6.4 oz (69.582 kg)  BMI 25.53 kg/m2 General:   Alert,  Well-developed, well-nourished, pleasant and cooperative in NAD Head:  Normocephalic and atraumatic. Eyes:  Sclera clear, no icterus.   Conjunctiva pink. Mouth:  No deformity or lesions, dentition normal. Neck:  Supple; no masses or thyromegaly. Heart:  Regular rate and rhythm; no murmurs, clicks, rubs,  or gallops. Abdomen:  Soft, nontender and nondistended. No masses, hepatosplenomegaly or hernias noted. Normal bowel sounds, without guarding, and without rebound.   Msk:  Symmetrical without gross deformities. Normal posture. Pulses:  Normal pulses  noted. Extremities:  Without clubbing or edema. Neurologic:  Alert and  oriented x4;  grossly normal neurologically. Skin:  Intact without significant lesions or rashes. Cervical Nodes:  No significant cervical adenopathy. Psych:  Alert and cooperative. Normal mood and affect.

## 2010-05-30 NOTE — Progress Notes (Signed)
Cc to PCP 

## 2010-05-30 NOTE — Patient Instructions (Signed)
In 4 weeks, stop Entocort as directed  Call if diarrhea returns & does not respond to pepto or imodium Call sooner if needed

## 2010-05-30 NOTE — Assessment & Plan Note (Signed)
With erosive reflux esophagitis. She is doing very well on Exelon 60 mg daily. She should continue this regimen.

## 2010-05-30 NOTE — Assessment & Plan Note (Signed)
Monica Soto is a 45 year old Caucasian female recently diagnosed with lymphocytic colitis. She is doing very well on Entocort 6 mg daily. She is to continue Entocort for 4 more weeks and then discontinue. If she has symptoms she will try Imodium or Pepto-Bismol. If no relief she is going to contact us.

## 2010-06-20 NOTE — H&P (Signed)
Monica Soto, Monica Soto                 ACCOUNT NO.:  [id number]   MEDICAL RECORD NO.:  [id number]          PATIENT TYPE:  AMB   LOCATION:  [facility]                           FACILITY:  [facility]   PHYSICIAN:  Lenoard Aden, M.D.DATE OF BIRTH:  04/03/1965   DATE OF ADMISSION:  DATE OF DISCHARGE:                              HISTORY & PHYSICAL   CHIEF COMPLAINT:  Dysmenorrhea, pelvic pain.   HISTORY OF PRESENT ILLNESS:  She is a 45 year old white female, G3, P2,  history of vaginal delivery x2, history of endometrial ablation in 2006  who presents with persistent dysmenorrhea and pelvic pain refractory to  oral treatment for definitive treatment in the form of hysterectomy and  ovarian conservation.   ALLERGIES:  NO KNOWN DRUG ALLERGIES.   MEDICATIONS:  None.   PAST MEDICAL HISTORY:  She has a family history of heart disease,  hypertension, and diabetes.  She has a history of endometrial ablation  as noted, tubal ligation, vaginal delivery x2, sigmoidoscopy, anemia and  ovarian cyst.   SOCIAL HISTORY:  She is a smoker of less than a pack a day.  She denies  domestic or physical violence.  She works currently for the [employer].   PHYSICAL EXAMINATION:  VITAL SIGNS:  Blood pressure 125/65, weight 148  pounds.  HEENT:  Normal.  LUNGS:  Clear.  HEART:  Regular rate and rhythm.  ABDOMEN:  Soft and nontender.  PELVIC:  Reveals a bulky and tender uterus with no adnexal masses noted.  A previous ultrasound that was performed on Jul 03, 2006, reveals  bilateral normal ovaries.  EXTREMITIES:  No cords.  NEUROLOGIC:  Nonfocal.  SKIN:  Intact.   IMPRESSION:  1. Persistent dysmenorrhea/menorrhagia.  2. Refractory medical therapy status post tubal ligation, status post      endometrial ablation for definitive therapy.   PLAN:  Plan to proceed with TLH versus LAVH versus TAH.  Risks of  anesthesia, infection, bleeding, injury of abdominal organs and need for  repair were discussed, late or  delayed  complications to include bowel  and bladder injury noted and inability to cure pelvic pain noted.  The  patient acknowledges and wishes to proceed.      Lenoard Aden, M.D.  Electronically Signed     RJT/MEDQ  D:  10/10/2006  T:  10/10/2006  Job:  [id number]

## 2010-06-20 NOTE — Op Note (Signed)
Monica Soto, Monica Soto                 ACCOUNT NO.:  [id number]   MEDICAL RECORD NO.:  [id number]          PATIENT TYPE:  AMB   LOCATION:  [facility]                           FACILITY:  [facility]   PHYSICIAN:  Lenoard Aden, M.D.DATE OF BIRTH:  Jun 16, 1965   DATE OF PROCEDURE:  10/10/2006  DATE OF DISCHARGE:                               OPERATIVE REPORT   PREOPERATIVE DIAGNOSIS:  Refractory dysmenorrhea and menorrhagia status  post endometrial ablation with desire for definitive therapy.   POSTOPERATIVE DIAGNOSES:  1. Refractory dysmenorrhea and menorrhagia status post endometrial      ablation with desire for definitive therapy.  2. Enterocele.   PROCEDURE:  Total laparoscopic hysterectomy and McCall culdoplasty.   SURGEON:  Lenoard Aden, M.D.   ASSISTANT:  Genia Del, M.D.   ANESTHESIA:  General by Hatchett.   ESTIMATED BLOOD LOSS:  Less than 50 mL.   COMPLICATIONS:  None.   DRAINS:  Foley.   COUNTS:  Correct.   CONDITION ON DISCHARGE:  Patient to recovery room in good condition.   SPECIMEN:  Uterus and cervix to pathology.   DESCRIPTION OF PROCEDURE:  After being apprised of the risks of  anesthesia, infection, bleeding, injury to abdominal organs with need  for repair, delayed versus immediate complications to include bowel and  bladder injury, the patient brought to the operating room where she was  administered general anesthetic without complications and prepped and  draped in the usual sterile fashion.  Foley catheter placed.  Rumi  retractor placed per vagina in the standard fashion.  The patient is  then prepped and draped after placement of the Rumi.   Infraumbilical incision made with a scalpel.  Veress needle placed,  opening pressure of -2 noted.  4 liters CO2 insufflated without  difficulty.  Trocar placed.  Atraumatic trocar placement visualized  normal liver, gallbladder bed, normal appendiceal area.  Surgically  divided tubes were divided and noted  two normal ovaries.  Normal tubes,  normal anterior and posterior cul-de-sac are noted.  Two 5-mm port are  made on the right and left lateral side under direct visualization and  transillumination atraumatically along the midclavicular clavicular line  halfway between the pubic bone and umbilical port.  At this time, the  gyrus is placed through the laparoscope, and the lateral ports and the  round ligaments are bilaterally cauterized and ligated and divided.  The  bladder flap is developed using the gyrus in a standard fashion.  The  tubo-ovarian ligament is divided bilaterally.  The uterine vessels are  skeletonized bilaterally, clamped and ligated using the gyrus.  Good  hemostasis is noted.  At this point, the spatula is used to divide along  the cervicovaginal junction without difficulty.  Good hemostasis is  achieved with the Rumi.  Specimens removed and transported down into the  vagina were it is left to maintain pneumoperitoneum.  The cuff is  visualized and cauterized, with good hemostasis noted.  Ureters appear  normal bilaterally, not dilated, peristalsing normally.  The vaginal  cuff is closed front to back using interrupted 0 Vicryl  sutures x5.  At  this time, good hemostasis is noted.  Irrigation is accomplished.  CO2  is released.  Good hemostasis is appreciated.  At this time, all ports  are removed under direct visualization.  The incisions are closed using  4-0 Vicryl, 0 Vicryl, and Dermabond.   The patient tolerates the procedure well and is transferred to recovery  in good condition.      Lenoard Aden, M.D.  Electronically Signed     RJT/MEDQ  D:  10/10/2006  T:  10/10/2006  Job:  [id number]

## 2010-06-23 NOTE — Op Note (Signed)
Monica Soto, Monica Soto                 ACCOUNT NO.:  [id number]   MEDICAL RECORD NO.:  [id number]          PATIENT TYPE:  AMB   LOCATION:  DAY                           FACILITY:  [facility]   PHYSICIAN:  Tilda Burrow, M.D. DATE OF BIRTH:  March 02, 1965   DATE OF PROCEDURE:  10/05/2004  DATE OF DISCHARGE:                                 OPERATIVE REPORT   PREOPERATIVE DIAGNOSIS:  Heavy and prolonged menses.   POSTOPERATIVE DIAGNOSIS:  Heavy and prolonged menses.   PROCEDURE:  Hysteroscopy D&C, endometrial ablation.   SURGEON:  Tilda Burrow, M.D.   ASSISTANTRolm Baptise, C.S.T.   ANESTHESIA:  General.   COMPLICATIONS:  None.   FINDINGS:  Very elongate, firm, fibrotic, endocervical canal and lower  uterine segment. Smooth endometrial contours.   INDICATIONS:  A 45 year old female with debilitating menses described in the  HPI.   HOSPITAL COURSE:  Patient was taken to the operating room, prepped and  draped in the usual fashion for vaginal procedure. The cervix was grasped  with a single-toothed tenaculum, sounded to 9 cm, dilated to 23-French,  despite technical challenges due to the very firm, fibrous, lower uterine  segment. The hysteroscope could be introduced slowly and surely without any  suspicion of false channels. We were able to directly visualize the  hysteroscope going in. Photos #1 and 2 showed the uterine fundus at the  onset of the case with smooth, white, endometrial tissue and a couple of  artifacts related to the sounding visible. The tubal ostia are visible in  the corners of photos 1 and 2. Smooth, sharp curettage was then performed,  and visualization photo 3 performed at that time. Suction curettage was then  performed to try to give [place] a better visual field. Photo 4 shows this. We  then had satisfactory positioning and proceeding with endometrial ablation  using a 10-minute HTA endometrial ablation sequence. Photos 5 and 6 show  post ablation endometrium with  satisfactory thermal changes. Photos 7 and 8  show the lower uterine segment  and endocervical canal as the hysteroscope is removed. The patient had a  paracervical block with Marcaine with epinephrine injected x10 cc at 3, 4,  7, and 9 o'clock, followed by transverse to recovery where she will receive  IV Toradol and postoperative analgesics. Condition in the recovery room  excellent.      Tilda Burrow, M.D.  Electronically Signed     JVF/MEDQ  D:  10/05/2004  T:  10/05/2004  Job:  [id number]

## 2010-06-23 NOTE — H&P (Signed)
Monica Soto, Monica Soto                 ACCOUNT NO.:  [id number]   MEDICAL RECORD NO.:  [id number]          PATIENT TYPE:  AMB   LOCATION:                                FACILITY:  [facility]   PHYSICIAN:  Tilda Burrow, M.D. DATE OF BIRTH:  1965/02/20   DATE OF ADMISSION:  10/05/2004  DATE OF DISCHARGE:  LH                                HISTORY & PHYSICAL   ADMITTING DIAGNOSIS:  Heavy and prolonged menses.  Admitted for hysteroscopy  D&C and endometrial ablation.   HISTORY OF PRESENT ILLNESS:  This 45 year old female, 3 years status post  tubal ligation, has been seen in our office for progressively increasing  menses since her tubal.  She has had intermittent episodes of heavy  bleeding.  She describes the pain as worse than ovulation.  She has only one  good week per month.  She has nausea, vomiting, and diarrhea with her  periods.  The pain is in the right side of her back and down the leg during  menses.  It is a sharp shooting pain.  Review of the old record indicates  that there were normal tubes and ovaries bilaterally 3 years ago with no  evidence of endometriosis at the time.  Pelvic ultrasound was performed  which shows a uterus that is 9.1 x 6.7 x 4.1 cm, 130 cc volume.  Normal  elongate uterus with smooth uterine contour.  Midline uterus, anterior,  anteflexed, anteverted uterus.  No fibroids present.  The endometrial  thickness is 6 mm and normal for premenopausal status.  The ovaries are  within normal limits with small cysts seen on the right.  Right ovary is 2.3  x 1.6 cm in maximum diameter.  The patient has read about endometrial  ablation and has done research on the computer including Internet search  webmd and understands the procedure.  She wants the procedure at this time.  Her last period was August 30, 2004 to September 07, 2004, and we are inducing a  period at this time.  She was given Provera x5 days beginning September 26, 2004, with plans for doing the endometrial  ablation at the time of menstrual  withdrawal.   PAST MEDICAL HISTORY:  Diverticulosis, diverticulitis diagnosed by primary  care physician.   FAMILY HISTORY:  Hypertension, irritable bowel syndrome.  She has chronic  long-standing dysmenorrhea with her menses.   OBSTETRICAL HISTORY:  Vaginal delivery in 1995.  Miscarriage in 1998.   SURGICAL HISTORY:  1.  Tonsillectomy in 1972.  2.  D&C in 1998.  3.  Tubal ligation in 2003.   PAP SMEAR:  Class I normal.  The most recent Pap smear in November 2005.   PHYSICAL EXAMINATION:  GENERAL:  A healthy somber Caucasian female.  HEENT:  Pupils equal, round and reactive.  Extraocular movements intact.  NECK:  Supple.  Trachea midline.  CHEST:  Clear to auscultation.  ABDOMEN:  Nontender without masses.  PELVIC:  External genitalia multiparous.  Uterus anteflexed.  Adnexa  nontender.  EXTREMITIES:  Within normal limits.   PLAN:  Hysteroscopy  D&C, endometrial ablation, on October 05, 2004.      Tilda Burrow, M.D.  Electronically Signed     JVF/MEDQ  D:  09/26/2004  T:  09/26/2004  Job:  [id number]

## 2010-09-01 ENCOUNTER — Encounter: Payer: Self-pay | Admitting: Internal Medicine

## 2010-11-17 LAB — CBC
HCT: 35.1 — ABNORMAL LOW
HCT: 38.2
Hemoglobin: 12.3
Hemoglobin: 13.5
RBC: 3.88
RBC: 4.22
RDW: 12.1
RDW: 12.3
WBC: 16 — ABNORMAL HIGH
WBC: 7.5

## 2010-11-17 LAB — SAMPLE TO BLOOD BANK

## 2010-11-24 ENCOUNTER — Ambulatory Visit: Payer: PRIVATE HEALTH INSURANCE | Admitting: Internal Medicine

## 2010-12-01 ENCOUNTER — Ambulatory Visit (INDEPENDENT_AMBULATORY_CARE_PROVIDER_SITE_OTHER): Payer: PRIVATE HEALTH INSURANCE | Admitting: Internal Medicine

## 2010-12-01 ENCOUNTER — Encounter: Payer: Self-pay | Admitting: Internal Medicine

## 2010-12-01 DIAGNOSIS — K5289 Other specified noninfective gastroenteritis and colitis: Secondary | ICD-10-CM

## 2010-12-01 DIAGNOSIS — K219 Gastro-esophageal reflux disease without esophagitis: Secondary | ICD-10-CM

## 2010-12-01 DIAGNOSIS — K52832 Lymphocytic colitis: Secondary | ICD-10-CM

## 2010-12-01 NOTE — Progress Notes (Signed)
Primary Care Physician:  Kirk Ruths, MD Primary Gastroenterologist:  Dr. Jena Gauss Pre-Procedure History & Physical: HPI:  Monica Soto is a 45 y.o. female here for followup of lymphocytic colitis. Has been on Entocort sporadically. Does not take her Entocort on regular basis. She has intermittent flares of diarrhea there are periods of time or she may go several days and does very well without any diarrhea. She does take nonsteroidal agents frequently. She takes her to axillae only sporadically for reflux. She's had some transition in her job she switching she has what she thinks is going to be very beneficial. She has had a flare of diarrhea not feeling well she's been out of work since October 11. She is going back to night.  Has mild unrelenting right lower quadrant abdominal pain not affected by the poor bowel movements etc. She is seeing Dr. Emelda Fear for her periodic GYN evaluation in a couple of weeks.  This lady had a normal ileum on colonoscopy. Tubulovillous adenoma dictates follow up colonoscopy in 3 years. Celiac panel previously negative.  Past Medical History  Diagnosis Date  . Microscopic colitis 04/26/2010    Colonoscopy by Dr. Jena Gauss, good response with Entocort  . Erosive esophagitis 04/26/10    EGD by Dr. Jena Gauss, small hiatal hernia  . Tubular adenoma of colon 04/26/10    Junction of descending and sigmoid 40 CM from anus  . Depression   . IBS (irritable bowel syndrome)     Diarrhea predominant    Past Surgical History  Procedure Date  . Appendectomy 2/11    Dr. Malvin Johns with a delayed closure  . Tubal ligation   . Tonsillectomy     Prior to Admission medications   Medication Sig Start Date End Date Taking? Authorizing Provider  budesonide (ENTOCORT EC) 3 MG 24 hr capsule Take 1 capsule (3 mg total) by mouth 2 (two) times daily. 04/29/10 04/29/11 Yes Corbin Ade, MD  dexlansoprazole (DEXILANT) 60 MG capsule Take 60 mg by mouth daily.     Yes Historical Provider, MD    diphenoxylate-atropine (LOMOTIL) 2.5-0.025 MG per tablet Take 1 tablet by mouth as needed.     Yes Historical Provider, MD  hyoscyamine (LEVBID) 0.375 MG 12 hr tablet Take 0.375 mg by mouth 2 (two) times daily as needed.    Yes Historical Provider, MD  ibuprofen (ADVIL,MOTRIN) 200 MG tablet Take 200 mg by mouth every 6 (six) hours as needed.     Yes Historical Provider, MD  Loperamide-Simethicone (IMODIUM MULTI-SYMPTOM RELIEF) 2-125 MG TABS Take by mouth as needed.     Yes Historical Provider, MD    Allergies as of 12/01/2010  . (No Known Allergies)    Family History  Problem Relation Age of Onset  . Diabetes Mother   . Coronary artery disease Mother     History   Social History  . Marital Status: Married    Spouse Name: N/A    Number of Children: N/A  . Years of Education: N/A   Occupational History  .  [employer] [employer]    Third shift   Social History Main Topics  . Smoking status: Current Everyday Smoker -- 0.5 packs/day  . Smokeless tobacco: Not on file  . Alcohol Use: Yes     seldom  . Drug Use: No  . Sexually Active: Not on file   Other Topics Concern  . Not on file   Social History Narrative  . No narrative on file    Review  of Systems: See HPI, otherwise negative ROS  Physical Exam: BP 111/71  Pulse 92  Temp 97.4 F (36.3 C)  Ht 5\' 5"  (1.651 m)  Wt 159 lb 12.8 oz (72.485 kg)  BMI 26.59 kg/m2 General:   Alert,  Well-developed, well-nourished, pleasant and cooperative in NAD Head:  Normocephalic and atraumatic. Neck:  Supple; no masses or thyromegaly. Abdomen:  Soft, nontender and nondistended. No masses, hepatosplenomegaly or hernias noted. Normal bowel sounds, without guarding, and without rebound.    Normal mood and affect.  Impression/Plan:

## 2010-12-01 NOTE — Assessment & Plan Note (Signed)
Pleasant 45 year old lady with microscopic colitis. Has definitely improved on Entocort but really hasn't taken a full course of therapy. Underlying IBS. Negative celiac screen. Complicated GERD. Taking acid suppression only sporadically. Frequent NSAID use.  Recommendations: Get back on Anticort 6 mg daily for 6 weeks then back off to 3 mg daily for 6 weeks. Limit nonsteroidal drug use. Take Exelon 60 mg every day to treat GERD. The chronic nature of this entity has been reviewed with the patient.  I agree with proceeding with a gynecology evaluation check out vague right lower quadrant abdominal pain she has been having. Doubt active GI disease producing this symptom at this time.  Plan see this nice lady back in the office in 3 months.  Work excuse provided

## 2010-12-01 NOTE — Patient Instructions (Signed)
Increase Entocort to 6 mg or 2 tablets daily for 6 weeks then decrease to 3 mg daily for 6 weeks; office visit with me and in 3 months  Limit nonsteroidal drug use  Continue Dexilant 60 mg orally daily

## 2010-12-18 ENCOUNTER — Other Ambulatory Visit (HOSPITAL_COMMUNITY): Payer: Self-pay | Admitting: Family Medicine

## 2010-12-18 ENCOUNTER — Ambulatory Visit (HOSPITAL_COMMUNITY)
Admission: RE | Admit: 2010-12-18 | Discharge: 2010-12-18 | Disposition: A | Discharge status: Home / Self Care | Payer: 59 | Source: Ambulatory Visit | Attending: Family Medicine | Admitting: Family Medicine

## 2010-12-18 DIAGNOSIS — M25579 Pain in unspecified ankle and joints of unspecified foot: Secondary | ICD-10-CM | POA: Insufficient documentation

## 2010-12-19 ENCOUNTER — Ambulatory Visit (HOSPITAL_COMMUNITY)
Admission: RE | Admit: 2010-12-19 | Discharge: 2010-12-19 | Disposition: A | Discharge status: Home / Self Care | Payer: PRIVATE HEALTH INSURANCE | Source: Ambulatory Visit | Attending: Family Medicine | Admitting: Family Medicine

## 2010-12-19 DIAGNOSIS — M79609 Pain in unspecified limb: Secondary | ICD-10-CM | POA: Insufficient documentation

## 2010-12-19 DIAGNOSIS — M7989 Other specified soft tissue disorders: Secondary | ICD-10-CM | POA: Insufficient documentation

## 2010-12-19 DIAGNOSIS — M25579 Pain in unspecified ankle and joints of unspecified foot: Secondary | ICD-10-CM

## 2011-03-02 ENCOUNTER — Encounter: Payer: Self-pay | Admitting: Internal Medicine

## 2011-03-02 ENCOUNTER — Ambulatory Visit (INDEPENDENT_AMBULATORY_CARE_PROVIDER_SITE_OTHER): Payer: PRIVATE HEALTH INSURANCE | Admitting: Internal Medicine

## 2011-03-02 VITALS — BP 98/64 | HR 78 | Temp 98.5°F | Ht 65.0 in | Wt 158.4 lb

## 2011-03-02 DIAGNOSIS — K52832 Lymphocytic colitis: Secondary | ICD-10-CM

## 2011-03-02 DIAGNOSIS — K5289 Other specified noninfective gastroenteritis and colitis: Secondary | ICD-10-CM

## 2011-03-02 DIAGNOSIS — K219 Gastro-esophageal reflux disease without esophagitis: Secondary | ICD-10-CM

## 2011-03-02 DIAGNOSIS — K589 Irritable bowel syndrome without diarrhea: Secondary | ICD-10-CM

## 2011-03-02 NOTE — Patient Instructions (Signed)
Take entocort 3 mg daily for one more month and then stop  Use Imodium and / or lev-Bid as needed for occasional diarrhea.  Office visit in 3 months

## 2011-03-02 NOTE — Progress Notes (Signed)
Primary Care Physician:  Kirk Ruths, MD, MD Primary Gastroenterologist:  Dr.  Jena Gauss      HPI:  Monica Soto is a 46 y.o. female here for followup of lymphocytic colitis, irritable bowel syndrome and GERD.  Patient reports GERD doing very well on Dexilant 60 mg orally daily. She misses a single dose she has recurrent symptoms; no dysphagia, no nausea or vomiting. Not taking the ibuprofen. Takes acetaminophen occasionally for aches and pains. Taking into Entocort 3 mg daily. She reports approximately 2/3 of time she does well with - one bowel movement daily the other one third of the time she may have multiple nonbloody bowel movements daily. She takes Levbid and Imodium on occasion for her bad days. She seen a GYN, Dr. Billy Coast - found her to be hypothyroid recently -  started her on Synthroid (just yesterday). AIlso found to have a possible abnormality in her right adnexa for which she is undergoing a transvaginal ultrasound next week. Tubulovillous adenoma removed from her colon last year she will be due for surveillance colonoscopy in 2 years from now. She is training for a better job at the [employer]. Things are fairly stressful there at this time as she reports.  Past Medical History  Diagnosis Date  . Microscopic colitis 04/26/2010    Colonoscopy by Dr. Jena Gauss, good response with Entocort  . Erosive esophagitis 04/26/10    EGD by Dr. Jena Gauss, small hiatal hernia  . Tubular adenoma of colon 04/26/10    Junction of descending and sigmoid 40 CM from anus  . Depression   . IBS (irritable bowel syndrome)     Diarrhea predominant    Past Surgical History  Procedure Date  . Appendectomy 2/11    Dr. Malvin Johns with a delayed closure  . Tubal ligation   . Tonsillectomy     Prior to Admission medications   Medication Sig Start Date End Date Taking? Authorizing Provider  budesonide (ENTOCORT EC) 3 MG 24 hr capsule Take 1 capsule (3 mg total) by mouth 2 (two) times daily. 04/29/10 04/29/11  Yes Corbin Ade, MD  dexlansoprazole (DEXILANT) 60 MG capsule Take 60 mg by mouth daily.     Yes Historical Provider, MD  diphenoxylate-atropine (LOMOTIL) 2.5-0.025 MG per tablet Take 1 tablet by mouth as needed.     Yes Historical Provider, MD  hyoscyamine (LEVBID) 0.375 MG 12 hr tablet Take 0.375 mg by mouth 2 (two) times daily as needed.    Yes Historical Provider, MD  ibuprofen (ADVIL,MOTRIN) 200 MG tablet Take 200 mg by mouth every 6 (six) hours as needed.     Yes Historical Provider, MD  levothyroxine (SYNTHROID, LEVOTHROID) 50 MCG tablet Take 50 mcg by mouth daily.   Yes Historical Provider, MD  Loperamide-Simethicone (IMODIUM MULTI-SYMPTOM RELIEF) 2-125 MG TABS Take by mouth as needed.     Yes Historical Provider, MD    Allergies as of 03/02/2011  . (No Known Allergies)    Family History  Problem Relation Age of Onset  . Diabetes Mother   . Coronary artery disease Mother     History   Social History  . Marital Status: Married    Spouse Name: N/A    Number of Children: N/A  . Years of Education: N/A   Occupational History  .  [employer]    Third shift   Social History Main Topics  . Smoking status: Current Everyday Smoker -- 0.5 packs/day  . Smokeless tobacco: Not on file  .  Alcohol Use: Yes     seldom  . Drug Use: No  . Sexually Active: Not on file   Other Topics Concern  . Not on file   Social History Narrative  . No narrative on file    Review of Systems: See HPI, otherwise negative ROS  Physical Exam: BP 98/64  Pulse 78  Temp(Src) 98.5 F (36.9 C) (Temporal)  Ht 5\' 5"  (1.651 m)  Wt 158 lb 6.4 oz (71.85 kg)  BMI 26.36 kg/m2 General:   Alert,  Well-developed, well-nourished, pleasant and cooperative in NAD Skin:  Intact without significant lesions or rashes. Eyes:  Sclera clear, no icterus.   Conjunctiva pink. Ears:  Normal auditory acuity. Nose:  No deformity, discharge,  or lesions. Mouth:  No deformity or lesions. Neck:  Supple; no  masses or thyromegaly. No significant cervical adenopathy. Lungs:  Clear throughout to auscultation.   No wheezes, crackles, or rhonchi. No acute distress. Heart:  Regular rate and rhythm; no murmurs, clicks, rubs,  or gallops. Abdomen: Non-distended, normal bowel sounds.  Soft and nontender without appreciable mass or hepatosplenomegaly.  Pulses:  Normal pulses noted. Extremities:  Without clubbing or edema.  Impression/Plan:

## 2011-03-02 NOTE — Assessment & Plan Note (Signed)
Overall, patient is doing very well from a standpoint of her GERD. In fact I feel that her microscopic colitis is in remission. 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 I explained her hypothyroidism could certainly be contributing to some of her GI symptoms.  Recommendations: Entocort 3 mg daily for one more month then stop  Utilize Imodium as needed for breakthrough symptoms of diarrhea  ContinuedDexilant 60 mg orally daily  Office visit here in 3 months  Surveillance colonoscopy 2 years

## 2011-05-03 ENCOUNTER — Emergency Department (HOSPITAL_COMMUNITY)
Admission: EM | Admit: 2011-05-03 | Discharge: 2011-05-03 | Disposition: A | Discharge status: Home / Self Care | Payer: 59 | Attending: Emergency Medicine | Admitting: Emergency Medicine

## 2011-05-03 ENCOUNTER — Emergency Department (HOSPITAL_COMMUNITY): Payer: 59

## 2011-05-03 ENCOUNTER — Encounter (HOSPITAL_COMMUNITY): Payer: Self-pay | Admitting: *Deleted

## 2011-05-03 DIAGNOSIS — R110 Nausea: Secondary | ICD-10-CM | POA: Insufficient documentation

## 2011-05-03 DIAGNOSIS — F3289 Other specified depressive episodes: Secondary | ICD-10-CM | POA: Insufficient documentation

## 2011-05-03 DIAGNOSIS — H538 Other visual disturbances: Secondary | ICD-10-CM | POA: Insufficient documentation

## 2011-05-03 DIAGNOSIS — F329 Major depressive disorder, single episode, unspecified: Secondary | ICD-10-CM | POA: Insufficient documentation

## 2011-05-03 DIAGNOSIS — Z79899 Other long term (current) drug therapy: Secondary | ICD-10-CM | POA: Insufficient documentation

## 2011-05-03 DIAGNOSIS — R51 Headache: Secondary | ICD-10-CM | POA: Insufficient documentation

## 2011-05-03 DIAGNOSIS — K589 Irritable bowel syndrome without diarrhea: Secondary | ICD-10-CM | POA: Insufficient documentation

## 2011-05-03 MED ORDER — PROCHLORPERAZINE EDISYLATE 5 MG/ML IJ SOLN: 10.0000 mg | Freq: Once | INTRAMUSCULAR | Status: DC

## 2011-05-03 MED ORDER — KETOROLAC TROMETHAMINE 30 MG/ML IJ SOLN
30.0000 mg | Freq: Once | INTRAMUSCULAR | Status: AC
  Administered 2011-05-03: 30 mg via INTRAVENOUS
  Filled 2011-05-03: qty 1

## 2011-05-03 MED ORDER — PROMETHAZINE HCL 25 MG/ML IJ SOLN
12.5000 mg | Freq: Once | INTRAMUSCULAR | Status: AC
  Administered 2011-05-03: 12.5 mg via INTRAVENOUS
  Filled 2011-05-03: qty 1

## 2011-05-03 MED ORDER — SODIUM CHLORIDE 0.9 % IV BOLUS (SEPSIS)
500.0000 mL | Freq: Once | INTRAVENOUS | Status: AC
  Administered 2011-05-03: 500 mL via INTRAVENOUS

## 2011-05-03 NOTE — ED Notes (Signed)
CT called to notify pt being ready.

## 2011-05-03 NOTE — Discharge Instructions (Signed)

## 2011-05-03 NOTE — ED Notes (Signed)
Headache since this am.  Nausea , no vomiting.

## 2011-05-03 NOTE — ED Provider Notes (Signed)
History     CSN: [id number]  Arrival date & time 05/03/11  1341   First MD Initiated Contact with Patient 05/03/11 1350      Chief Complaint  Patient presents with  . Headache    (Consider location/radiation/quality/duration/timing/severity/associated sxs/prior treatment) Patient is a 46 y.o. female presenting with headaches. The history is provided by the patient.  Headache  This is a new problem. Associated symptoms include nausea. Pertinent negatives include no shortness of breath and no vomiting.   patient has had a headache that she woke up with early this morning. It is throbbing it is on the back of her head. She states that she has had recent tension headaches it felt like this but less severe. She's had nausea some vomiting. She states her vision has gotten a little blurry. She has no relief with Tylenol or Goody powder. No fevers. No trauma. No numbness or weakness. She's recently been on thyroid medication has had recent dose changes. Around 2 weeks ago her dose was increased from 50-75. No abdominal pain. He does have a history of colitis.  Past Medical History  Diagnosis Date  . Microscopic colitis 04/26/2010    Colonoscopy by Dr. Jena Gauss, good response with Entocort  . Erosive esophagitis 04/26/10    EGD by Dr. Jena Gauss, small hiatal hernia  . Tubular adenoma of colon 04/26/10    Junction of descending and sigmoid 40 CM from anus  . Depression   . IBS (irritable bowel syndrome)     Diarrhea predominant    Past Surgical History  Procedure Date  . Appendectomy 2/11    Dr. Malvin Johns with a delayed closure  . Tubal ligation   . Tonsillectomy   . Abdominal hysterectomy     Family History  Problem Relation Age of Onset  . Diabetes Mother   . Coronary artery disease Mother     History  Substance Use Topics  . Smoking status: Current Everyday Smoker -- 0.5 packs/day  . Smokeless tobacco: Not on file  . Alcohol Use: Yes     seldom    OB History    Grav Para Term  Preterm Abortions TAB SAB Ect Mult Living                  Review of Systems  Constitutional: Negative for activity change and appetite change.  HENT: Negative for neck stiffness.   Eyes: Negative for pain.  Respiratory: Negative for chest tightness and shortness of breath.   Cardiovascular: Negative for chest pain and leg swelling.  Gastrointestinal: Positive for nausea. Negative for vomiting, abdominal pain and diarrhea.  Genitourinary: Negative for flank pain.  Musculoskeletal: Negative for back pain.  Skin: Negative for rash.  Neurological: Positive for headaches. Negative for weakness and numbness.  Psychiatric/Behavioral: Negative for behavioral problems.    Allergies  Review of patient's allergies indicates no known allergies.  Home Medications   Current Outpatient Rx  Name Route Sig Dispense Refill  . LEVOTHYROXINE SODIUM 75 MCG PO TABS Oral Take 75 mcg by mouth daily.    Marland Kitchen LOPERAMIDE-SIMETHICONE 2-125 MG PO TABS Oral Take by mouth as needed.      . DEXLANSOPRAZOLE 60 MG PO CPDR Oral Take 1 capsule (60 mg total) by mouth daily. 30 capsule 11  . DIPHENOXYLATE-ATROPINE 2.5-0.025 MG PO TABS Oral Take 1 tablet by mouth as needed. FOR STOMACH CRAMPING      BP 120/76  Pulse 68  Temp(Src) 98.1 F (36.7 C) (Oral)  Resp 16  Ht 5\' 5"  (1.651 m)  Wt 147 lb (66.679 kg)  BMI 24.46 kg/m2  SpO2 98%  Physical Exam  Nursing note and vitals reviewed. Constitutional: She is oriented to person, place, and time. She appears well-developed and well-nourished.  HENT:  Head: Normocephalic and atraumatic.  Right Ear: External ear normal.  Left Ear: External ear normal.  Eyes: EOM are normal. Pupils are equal, round, and reactive to light.  Neck: Normal range of motion. Neck supple.  Cardiovascular: Normal rate, regular rhythm and normal heart sounds.   No murmur heard. Pulmonary/Chest: Effort normal and breath sounds normal. No respiratory distress. She has no wheezes. She has no  rales.  Abdominal: Soft. Bowel sounds are normal. She exhibits no distension. There is no tenderness. There is no rebound and no guarding.  Musculoskeletal: Normal range of motion.  Neurological: She is alert and oriented to person, place, and time. No cranial nerve deficit.  Skin: Skin is warm and dry.  Psychiatric: She has a normal mood and affect. Her speech is normal.    ED Course  Procedures (including critical care time)  Labs Reviewed - No data to display Ct Head Wo Contrast  05/03/2011  *RADIOLOGY REPORT*  Clinical Data: Headache for 2 days, history migraines  CT HEAD WITHOUT CONTRAST  Technique:  Contiguous axial images were obtained from the base of the skull through the vertex without contrast.  Comparison: None  Findings: Normal ventricular morphology. No midline shift or mass effect. Normal appearance of brain parenchyma. No intracranial hemorrhage, mass lesion, or acute infarction. Visualized paranasal sinuses and mastoid air cells clear. Bones unremarkable.  IMPRESSION: No acute intracranial abnormalities.  Original Report Authenticated By: Lollie Marrow, M.D.     1. Headache       MDM  Patient with a headache since this morning. Nausea without vomiting. Nonfocal neural examination. Negative head CT. Recent    increase in her thyroid medication. She does not appear hyper or hypothyroid at this time. Patient feels better after Phenergan and Toradol. She'll be discharged home      Juliet Rude. Rubin Payor, MD 05/03/11 [phone]

## 2012-03-22 IMAGING — US US ABDOMEN COMPLETE
1 series · 14 of 25 positions shown · non-contrast
Comparison: 01/25/2010

CLINICAL DATA: Abdominal pain

COMPLETE ABDOMINAL ULTRASOUND

[Series 1: us abdomen complete · 0.28mm/px · 14 of 70 slices shown]
[im 1/70]
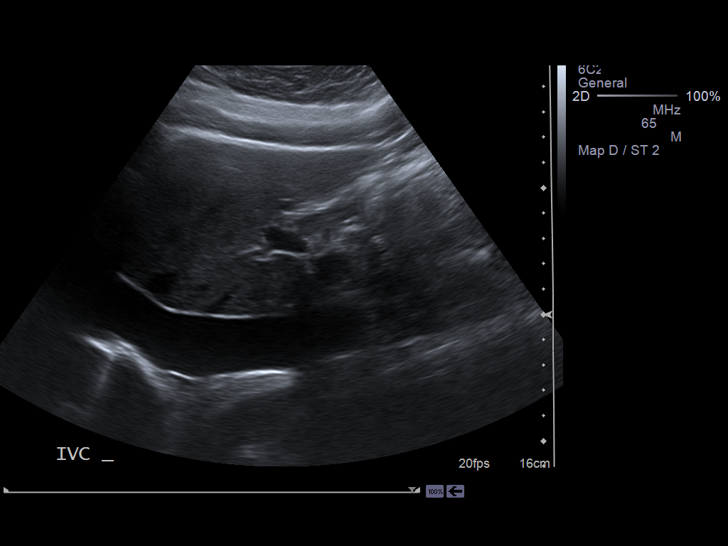
[im 6/70]
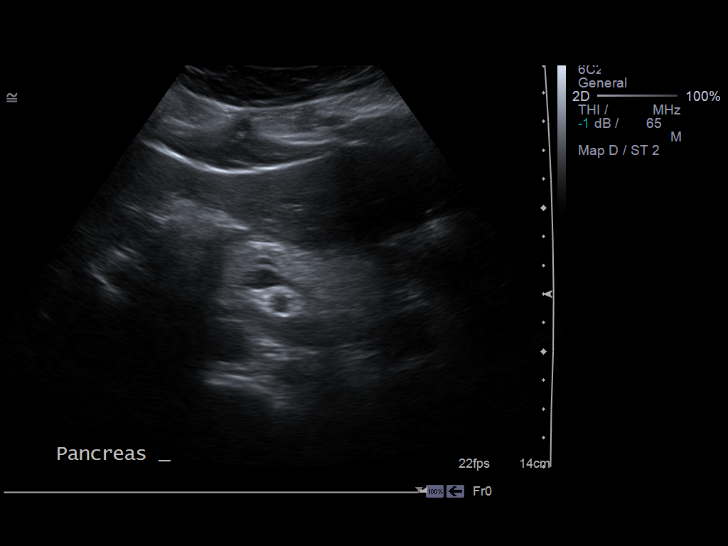
[im 12/70]
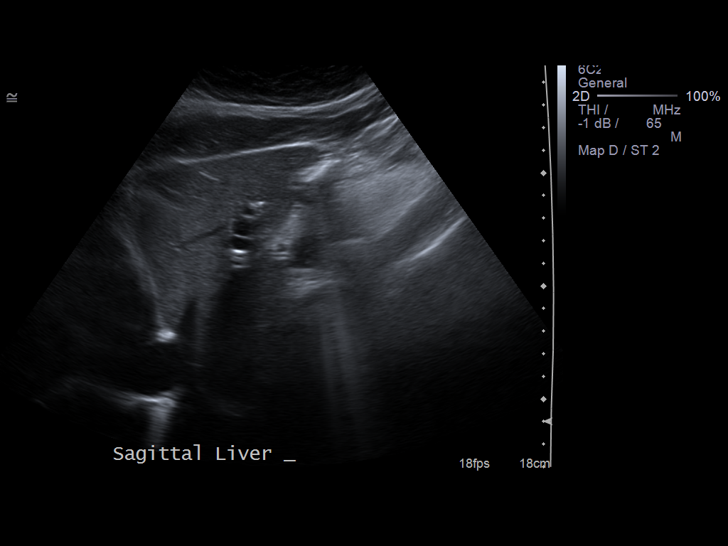
[im 18/70]
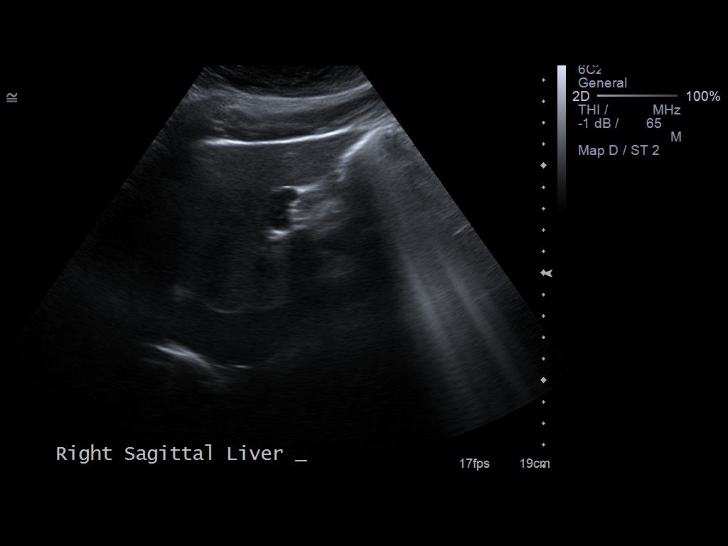
[im 24/70]
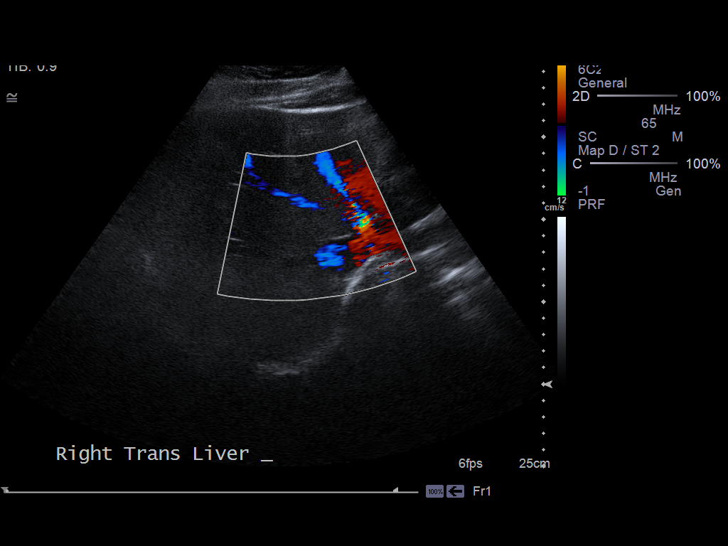
[im 26/70]
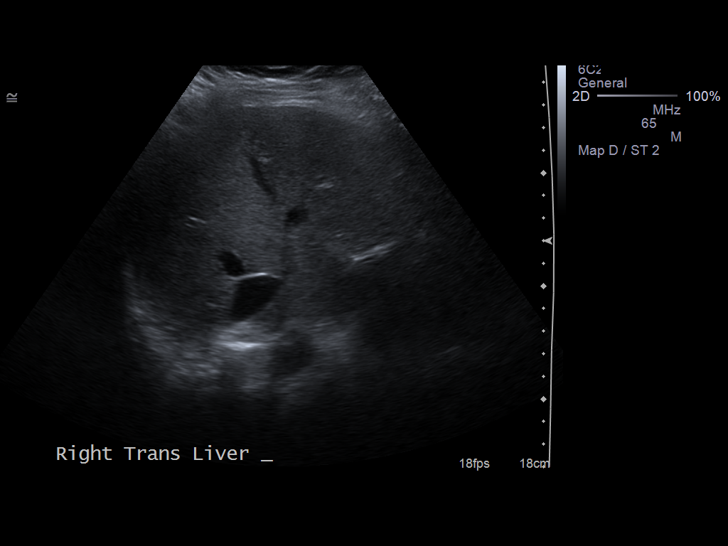
[im 32/70]
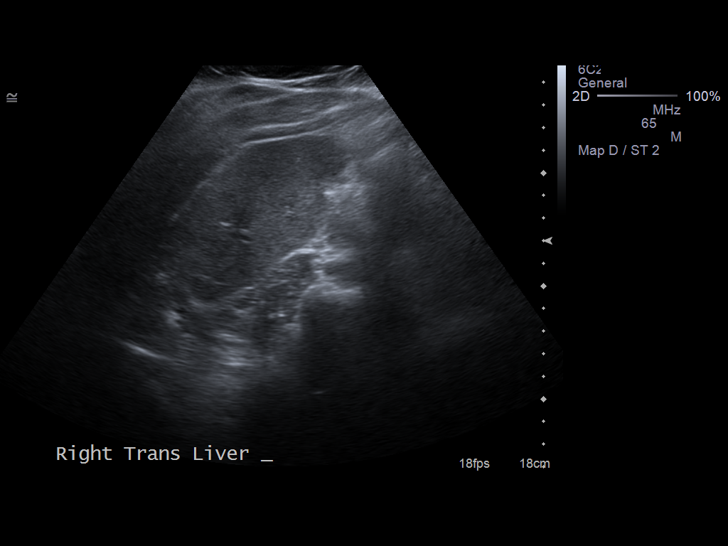
[im 38/70]
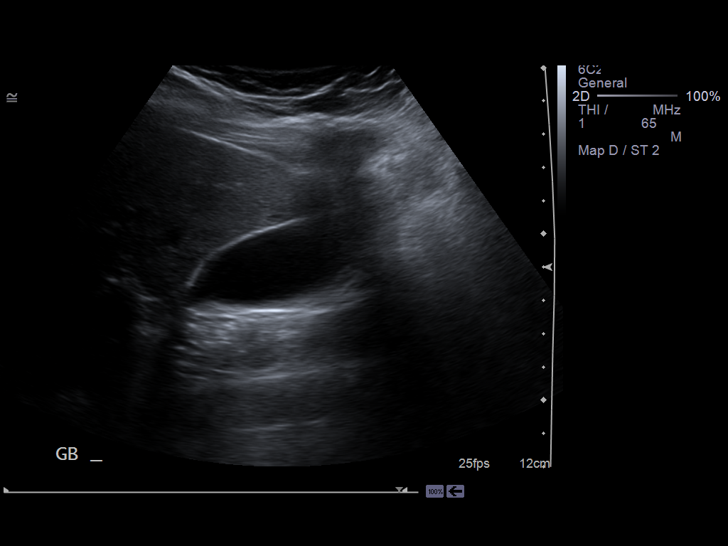
[im 44/70]
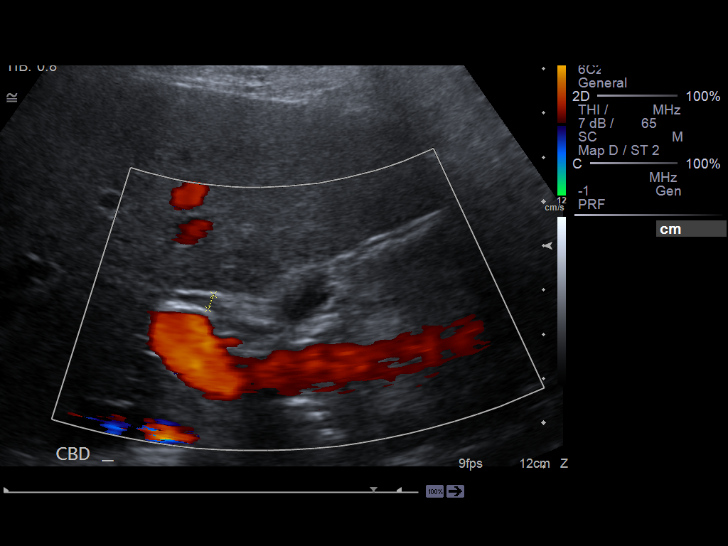
[im 47/70]
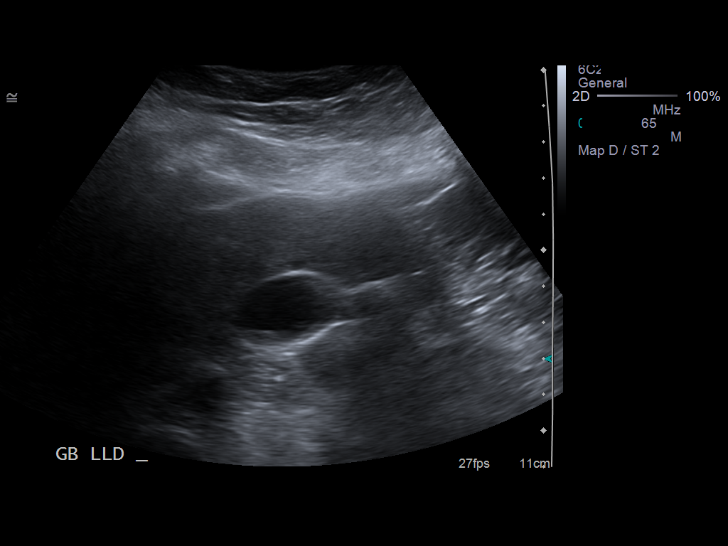
[im 52/70]
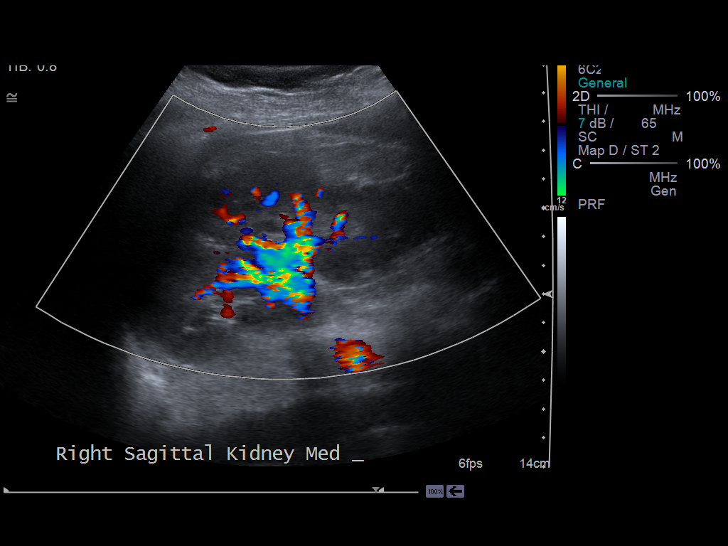
[im 58/70]
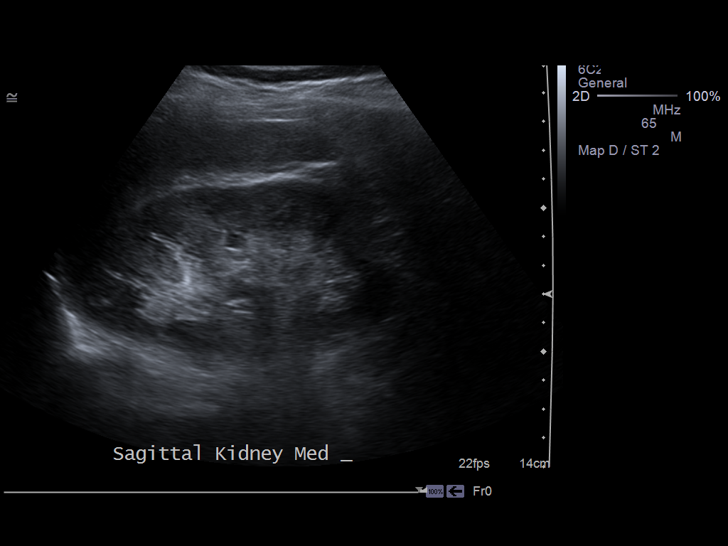
[im 64/70]
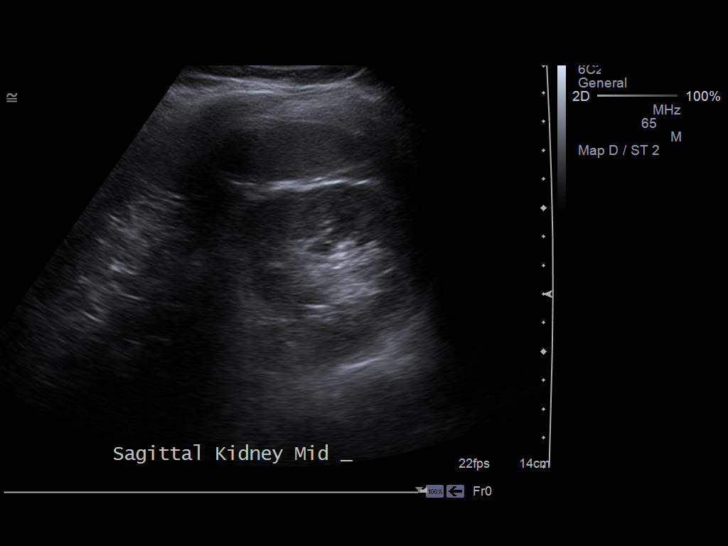
[im 70/70]
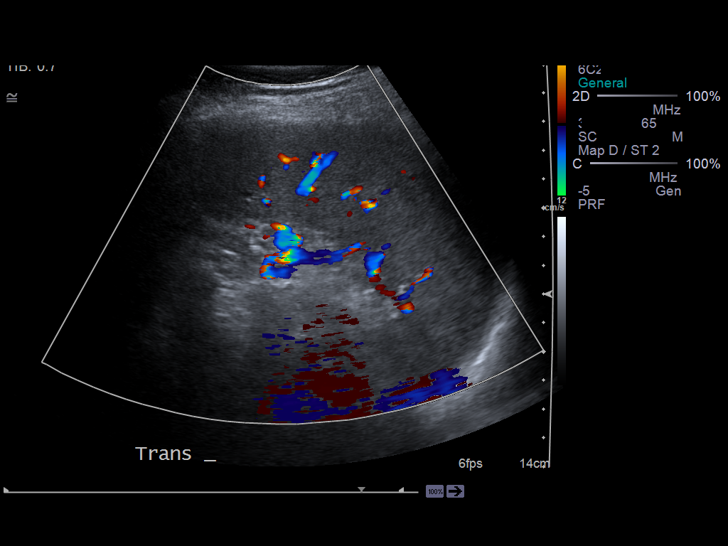

[14 of 25 positions shown; findings below may reference images not displayed]

FINDINGS: Gallbladder:  No gallstones, gallbladder wall thickening, or
pericholecystic fluid.  Sonographer reports no sonographic Murphy's
sign.

Common bile duct:  3.5 mm diameter, unremarkable

Liver:  No focal lesion identified.  Within normal limits in
parenchymal echogenicity.

IVC:  Appears normal.

Pancreas:  No focal abnormality seen.

Spleen:  9.9 cm craniocaudal length.  Unremarkable.

Right Kidney:  12.1 cm. No hydronephrosis.  Well-preserved cortex.
Normal size and parenchymal echotexture without focal
abnormalities.

Left Kidney:  12.7 cm. No hydronephrosis.  Well-preserved cortex.
Normal size and parenchymal echotexture without focal
abnormalities.

Abdominal aorta:  No aneurysm identified.
IMPRESSION: 1.  Normal gallbladder.  No acute abnormality.

## 2012-08-07 ENCOUNTER — Encounter (HOSPITAL_COMMUNITY): Payer: Self-pay | Admitting: *Deleted

## 2012-08-07 ENCOUNTER — Emergency Department (HOSPITAL_COMMUNITY)
Admission: EM | Admit: 2012-08-07 | Discharge: 2012-08-07 | Disposition: A | Discharge status: Home / Self Care | Payer: 59 | Attending: Emergency Medicine | Admitting: Emergency Medicine

## 2012-08-07 DIAGNOSIS — Z79899 Other long term (current) drug therapy: Secondary | ICD-10-CM | POA: Insufficient documentation

## 2012-08-07 DIAGNOSIS — K208 Other esophagitis without bleeding: Secondary | ICD-10-CM | POA: Insufficient documentation

## 2012-08-07 DIAGNOSIS — Z8659 Personal history of other mental and behavioral disorders: Secondary | ICD-10-CM | POA: Insufficient documentation

## 2012-08-07 DIAGNOSIS — L237 Allergic contact dermatitis due to plants, except food: Secondary | ICD-10-CM

## 2012-08-07 DIAGNOSIS — F17200 Nicotine dependence, unspecified, uncomplicated: Secondary | ICD-10-CM | POA: Insufficient documentation

## 2012-08-07 DIAGNOSIS — R21 Rash and other nonspecific skin eruption: Secondary | ICD-10-CM | POA: Insufficient documentation

## 2012-08-07 DIAGNOSIS — Z8719 Personal history of other diseases of the digestive system: Secondary | ICD-10-CM | POA: Insufficient documentation

## 2012-08-07 DIAGNOSIS — L255 Unspecified contact dermatitis due to plants, except food: Secondary | ICD-10-CM | POA: Insufficient documentation

## 2012-08-07 MED ORDER — CETIRIZINE HCL 10 MG PO CAPS: 1.0000 | ORAL_CAPSULE | Freq: Every day | ORAL | Status: DC

## 2012-08-07 MED ORDER — METHYLPREDNISOLONE SODIUM SUCC 125 MG IJ SOLR
80.0000 mg | Freq: Once | INTRAMUSCULAR | Status: AC
  Administered 2012-08-07: 80 mg via INTRAMUSCULAR
  Filled 2012-08-07: qty 2

## 2012-08-07 NOTE — ED Notes (Signed)
Swelling and redness right facial area

## 2012-08-07 NOTE — ED Notes (Signed)
States she was exposed to poison ivy Monday and now has an itchy rash all over.

## 2012-08-07 NOTE — ED Provider Notes (Signed)
History    CSN: [id number] Arrival date & time 08/07/12  1047  First MD Initiated Contact with Patient 08/07/12 1108     Chief Complaint  Patient presents with  . Poison Ivy  . Rash   (Consider location/radiation/quality/duration/timing/severity/associated sxs/prior Treatment) HPI Monica Soto is a 47 y.o. female who presents to the ED with a rash. The rash started 3 days ago. She was pulling weeds and was exposed to poison ivy and now itching all over. She has a few places on her face, neck, arms and legs. She denies any other problems. She denies fever or chills, nausea or vomiting. Patient thinks she needs a shot for the poison oak since it is on her face.   Past Medical History  Diagnosis Date  . Microscopic colitis 04/26/2010    Colonoscopy by Dr. Jena Gauss, good response with Entocort  . Erosive esophagitis 04/26/10    EGD by Dr. Jena Gauss, small hiatal hernia  . Tubular adenoma of colon 04/26/10    Junction of descending and sigmoid 40 CM from anus  . Depression   . IBS (irritable bowel syndrome)     Diarrhea predominant   Past Surgical History  Procedure Laterality Date  . Appendectomy  2/11    Dr. Malvin Johns with a delayed closure  . Tubal ligation    . Tonsillectomy    . Abdominal hysterectomy     Family History  Problem Relation Age of Onset  . Diabetes Mother   . Coronary artery disease Mother    History  Substance Use Topics  . Smoking status: Current Every Day Smoker -- 0.50 packs/day  . Smokeless tobacco: Not on file  . Alcohol Use: Yes     Comment: seldom   OB History   Grav Para Term Preterm Abortions TAB SAB Ect Mult Living                 Review of Systems  Constitutional: Negative for fever, chills and activity change.  HENT: Negative for sore throat, trouble swallowing and neck pain.   Respiratory: Negative for shortness of breath.   Gastrointestinal: Negative for nausea and vomiting.  Musculoskeletal:       Rash   Skin: Positive for rash.   Allergic/Immunologic: Positive for environmental allergies.  Neurological: Negative for headaches.  Psychiatric/Behavioral: The patient is not nervous/anxious.     Allergies  Review of patient's allergies indicates no known allergies.  Home Medications   Current Outpatient Rx  Name  Route  Sig  Dispense  Refill  . EXPIRED: dexlansoprazole (DEXILANT) 60 MG capsule   Oral   Take 1 capsule (60 mg total) by mouth daily.   30 capsule   11   . diphenoxylate-atropine (LOMOTIL) 2.5-0.025 MG per tablet   Oral   Take 1 tablet by mouth as needed. FOR STOMACH CRAMPING         . levothyroxine (SYNTHROID, LEVOTHROID) 75 MCG tablet   Oral   Take 75 mcg by mouth daily.         . Loperamide-Simethicone (IMODIUM MULTI-SYMPTOM RELIEF) 2-125 MG TABS   Oral   Take by mouth as needed.            BP 117/75  Pulse 87  Temp(Src) 97.9 F (36.6 C) (Oral)  Resp 20  Ht 5\' 5"  (1.651 m)  Wt 157 lb (71.215 kg)  BMI 26.13 kg/m2  SpO2 100% Physical Exam  Nursing note and vitals reviewed. Constitutional: She is oriented to person, place, and  time. She appears well-developed and well-nourished. No distress.  HENT:  Head: Normocephalic.  Mouth/Throat: Uvula is midline, oropharynx is clear and moist and mucous membranes are normal.  Eyes: EOM are normal.  Neck: Neck supple.  Cardiovascular: Normal rate.   Pulmonary/Chest: Effort normal and breath sounds normal.  Musculoskeletal: Normal range of motion.  Neurological: She is alert and oriented to person, place, and time. No cranial nerve deficit.  Skin: Rash noted.  Linear, vesicular rash noted forearms, lower legs, face and neck. No signs of infection  Psychiatric: She has a normal mood and affect. Her behavior is normal.    ED Course  Procedures Solu Medrol 80 mg IM MDM  47 y.o. female with allergic contact dermitis. Will treat symptoms. She is stable for discharge home without any immediate complications.  Discussed with the patient  clinical findings and plan of care and all questioned fully answered. She will return if any problems arise.    Medication List    TAKE these medications       Cetirizine HCl 10 MG Caps  Commonly known as:  ZYRTEC ALLERGY  Take 1 capsule (10 mg total) by mouth daily.      ASK your doctor about these medications       dexlansoprazole 60 MG capsule  Commonly known as:  DEXILANT  Take 1 capsule (60 mg total) by mouth daily.     diphenoxylate-atropine 2.5-0.025 MG per tablet  Commonly known as:  LOMOTIL  Take 1 tablet by mouth as needed. FOR STOMACH CRAMPING     IMODIUM MULTI-SYMPTOM RELIEF 2-125 MG Tabs  Generic drug:  Loperamide-Simethicone  Take by mouth as needed.     levothyroxine 75 MCG tablet  Commonly known as:  SYNTHROID, LEVOTHROID  Take 75 mcg by mouth daily.         [place], [place] 08/07/12 [phone]

## 2012-08-08 NOTE — ED Provider Notes (Signed)
Medical screening examination/treatment/procedure(s) were performed by non-physician practitioner and as supervising physician I was immediately available for consultation/collaboration.   Ronold Hardgrove B. Truth Barot, MD 08/08/12 1634 

## 2013-04-20 ENCOUNTER — Emergency Department (HOSPITAL_COMMUNITY)
Admission: EM | Admit: 2013-04-20 | Discharge: 2013-04-20 | Disposition: A | Discharge status: Home / Self Care | Payer: 59 | Attending: Emergency Medicine | Admitting: Emergency Medicine

## 2013-04-20 ENCOUNTER — Encounter (HOSPITAL_COMMUNITY): Payer: Self-pay | Admitting: Emergency Medicine

## 2013-04-20 DIAGNOSIS — F17200 Nicotine dependence, unspecified, uncomplicated: Secondary | ICD-10-CM | POA: Insufficient documentation

## 2013-04-20 DIAGNOSIS — Z9089 Acquired absence of other organs: Secondary | ICD-10-CM | POA: Insufficient documentation

## 2013-04-20 DIAGNOSIS — Z90710 Acquired absence of both cervix and uterus: Secondary | ICD-10-CM | POA: Insufficient documentation

## 2013-04-20 DIAGNOSIS — Z8659 Personal history of other mental and behavioral disorders: Secondary | ICD-10-CM | POA: Insufficient documentation

## 2013-04-20 DIAGNOSIS — Z79899 Other long term (current) drug therapy: Secondary | ICD-10-CM | POA: Insufficient documentation

## 2013-04-20 DIAGNOSIS — Z8719 Personal history of other diseases of the digestive system: Secondary | ICD-10-CM | POA: Insufficient documentation

## 2013-04-20 DIAGNOSIS — Z9851 Tubal ligation status: Secondary | ICD-10-CM | POA: Insufficient documentation

## 2013-04-20 DIAGNOSIS — N390 Urinary tract infection, site not specified: Secondary | ICD-10-CM

## 2013-04-20 LAB — COMPREHENSIVE METABOLIC PANEL
ALT: 22 U/L (ref 0–35)
AST: 16 U/L (ref 0–37)
Albumin: 4 g/dL (ref 3.5–5.2)
Alkaline Phosphatase: 90 U/L (ref 39–117)
BUN: 5 mg/dL — AB (ref 6–23)
CALCIUM: 9.2 mg/dL (ref 8.4–10.5)
CO2: 26 meq/L (ref 19–32)
CREATININE: 0.56 mg/dL (ref 0.50–1.10)
Chloride: 102 mEq/L (ref 96–112)
Glucose, Bld: 95 mg/dL (ref 70–99)
Potassium: 4 mEq/L (ref 3.7–5.3)
Sodium: 138 mEq/L (ref 137–147)
Total Bilirubin: 0.3 mg/dL (ref 0.3–1.2)
Total Protein: 7.2 g/dL (ref 6.0–8.3)

## 2013-04-20 LAB — CBC WITH DIFFERENTIAL/PLATELET
BASOS ABS: 0.1 10*3/uL (ref 0.0–0.1)
Basophils Relative: 1 % (ref 0–1)
EOS PCT: 1 % (ref 0–5)
Eosinophils Absolute: 0.1 10*3/uL (ref 0.0–0.7)
HEMATOCRIT: 41 % (ref 36.0–46.0)
Hemoglobin: 14.2 g/dL (ref 12.0–15.0)
LYMPHS PCT: 30 % (ref 12–46)
Lymphs Abs: 2.5 10*3/uL (ref 0.7–4.0)
MCH: 30.9 pg (ref 26.0–34.0)
MCHC: 34.6 g/dL (ref 30.0–36.0)
MCV: 89.3 fL (ref 78.0–100.0)
MONO ABS: 0.5 10*3/uL (ref 0.1–1.0)
MONOS PCT: 5 % (ref 3–12)
Neutro Abs: 5.4 10*3/uL (ref 1.7–7.7)
Neutrophils Relative %: 63 % (ref 43–77)
Platelets: 263 10*3/uL (ref 150–400)
RBC: 4.59 MIL/uL (ref 3.87–5.11)
RDW: 12.9 % (ref 11.5–15.5)
WBC: 8.6 10*3/uL (ref 4.0–10.5)

## 2013-04-20 LAB — URINALYSIS, ROUTINE W REFLEX MICROSCOPIC
Bilirubin Urine: NEGATIVE
Glucose, UA: NEGATIVE mg/dL
Ketones, ur: NEGATIVE mg/dL
LEUKOCYTES UA: NEGATIVE
Nitrite: NEGATIVE
PROTEIN: NEGATIVE mg/dL
Specific Gravity, Urine: 1.015 (ref 1.005–1.030)
UROBILINOGEN UA: 0.2 mg/dL (ref 0.0–1.0)
pH: 5.5 (ref 5.0–8.0)

## 2013-04-20 LAB — URINE MICROSCOPIC-ADD ON

## 2013-04-20 MED ORDER — CIPROFLOXACIN HCL 500 MG PO TABS: 500.0000 mg | ORAL_TABLET | Freq: Two times a day (BID) | ORAL | Status: DC

## 2013-04-20 NOTE — Discharge Instructions (Signed)
Follow up in 2-3 days if not improving.  Tylenol or motrin for pain

## 2013-04-20 NOTE — ED Notes (Signed)
Pt was seen at [facility] today and was referred to [facility] ED for blood in urine. Pt reports malaise and 7/10 lower abdominal pain.

## 2013-04-20 NOTE — ED Provider Notes (Signed)
CSN: [id number]     Arrival date & time 04/20/13  1735 History   First MD Initiated Contact with Patient 04/20/13 1804     Chief Complaint  Patient presents with  . Hematuria     (Consider location/radiation/quality/duration/timing/severity/associated sxs/prior Treatment) Patient is a 48 y.o. female presenting with hematuria. The history is provided by the patient (the pt has had lower abd pain and was seen earlier and told her urine had blood in it).  Hematuria This is a new problem. The current episode started 3 to 5 hours ago. The problem occurs constantly. The problem has not changed since onset.Associated symptoms include abdominal pain. Pertinent negatives include no chest pain and no headaches. Nothing aggravates the symptoms. Nothing relieves the symptoms.    Past Medical History  Diagnosis Date  . Microscopic colitis 04/26/2010    Colonoscopy by Dr. Gala Romney, good response with Entocort  . Erosive esophagitis 04/26/10    EGD by Dr. Gala Romney, small hiatal hernia  . Tubular adenoma of colon 04/26/10    Junction of descending and sigmoid 40 CM from anus  . Depression   . IBS (irritable bowel syndrome)     Diarrhea predominant   Past Surgical History  Procedure Laterality Date  . Appendectomy  2/11    Dr. Romona Curls with a delayed closure  . Tubal ligation    . Tonsillectomy    . Abdominal hysterectomy     Family History  Problem Relation Age of Onset  . Diabetes Mother   . Coronary artery disease Mother    History  Substance Use Topics  . Smoking status: Current Every Day Smoker -- 0.50 packs/day  . Smokeless tobacco: Not on file  . Alcohol Use: 0.6 oz/week    1 Glasses of wine per week     Comment: seldom   OB History   Grav Para Term Preterm Abortions TAB SAB Ect Mult Living                 Review of Systems  Constitutional: Negative for appetite change and fatigue.  HENT: Negative for congestion, ear discharge and sinus pressure.   Eyes: Negative for discharge.   Respiratory: Negative for cough.   Cardiovascular: Negative for chest pain.  Gastrointestinal: Positive for abdominal pain. Negative for diarrhea.  Genitourinary: Positive for hematuria. Negative for frequency.  Musculoskeletal: Negative for back pain.  Skin: Negative for rash.  Neurological: Negative for seizures and headaches.  Psychiatric/Behavioral: Negative for hallucinations.      Allergies  Review of patient's allergies indicates no known allergies.  Home Medications   Current Outpatient Rx  Name  Route  Sig  Dispense  Refill  . Aspirin-Acetaminophen-Caffeine (GOODY HEADACHE PO)   Oral   Take 1 packet by mouth 2 (two) times daily as needed (for pain).         Marland Kitchen dexlansoprazole (DEXILANT) 60 MG capsule   Oral   Take 1 capsule (60 mg total) by mouth daily.   30 capsule   11   . ciprofloxacin (CIPRO) 500 MG tablet   Oral   Take 1 tablet (500 mg total) by mouth 2 (two) times daily. One po bid x 7 days   14 tablet   0    BP 136/80  Pulse 68  Temp(Src) 98 F (36.7 C) (Oral)  Resp 20  Ht 5\' 5"  (1.651 m)  Wt 160 lb (72.576 kg)  BMI 26.63 kg/m2  SpO2 100% Physical Exam  Constitutional: She is oriented to person, place,  and time. She appears well-developed.  HENT:  Head: Normocephalic.  Eyes: Conjunctivae and EOM are normal. No scleral icterus.  Neck: Neck supple. No thyromegaly present.  Cardiovascular: Normal rate and regular rhythm.  Exam reveals no gallop and no friction rub.   No murmur heard. Pulmonary/Chest: No stridor. She has no wheezes. She has no rales. She exhibits no tenderness.  Abdominal: She exhibits no distension. There is tenderness. There is no rebound.  Minor suprapubic tender  Musculoskeletal: Normal range of motion. She exhibits no edema.  Lymphadenopathy:    She has no cervical adenopathy.  Neurological: She is oriented to person, place, and time. She exhibits normal muscle tone. Coordination normal.  Skin: No rash noted. No  erythema.  Psychiatric: She has a normal mood and affect. Her behavior is normal.    ED Course  Procedures (including critical care time) Labs Review Labs Reviewed  URINALYSIS, ROUTINE W REFLEX MICROSCOPIC - Abnormal; Notable for the following:    Hgb urine dipstick SMALL (*)    All other components within normal limits  COMPREHENSIVE METABOLIC PANEL - Abnormal; Notable for the following:    BUN 5 (*)    All other components within normal limits  URINE MICROSCOPIC-ADD ON  CBC WITH DIFFERENTIAL   Imaging Review No results found.   EKG Interpretation None      MDM   Final diagnoses:  UTI (lower urinary tract infection)    The chart was scribed for me under my direct supervision.  I personally performed the history, physical, and medical decision making and all procedures in the evaluation of this patient.Maudry Diego, MD 04/20/13 [phone]

## 2013-06-11 IMAGING — CT CT HEAD W/O CM
1 series · 16 of 30 positions shown, 20 images · non-contrast
Comparison: None

CLINICAL DATA: Headache for 2 days, history migraines

CT HEAD WITHOUT CONTRAST
TECHNIQUE: Contiguous axial images were obtained from the base of
the skull through the vertex without contrast.

[Series 2: headseq 4.8 h37s · axial · 0.42mm/px · z∈[+121,+256]mm · 16 of 30 slices shown, 20 images]
[im 2/30  brain]
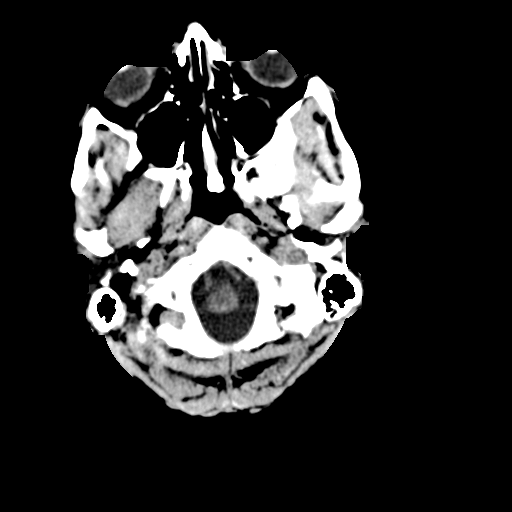
[im 2/30  bone]
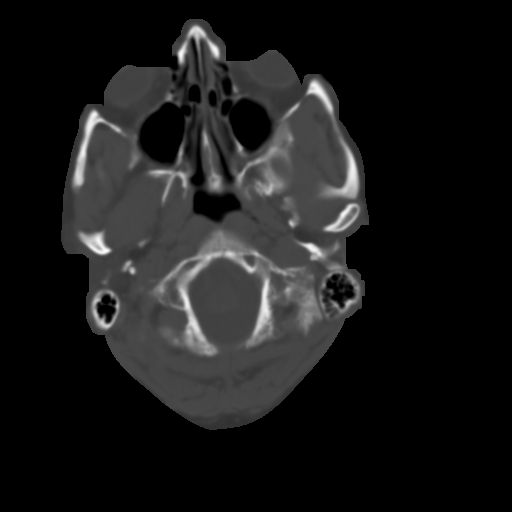
[im 4/30  brain]
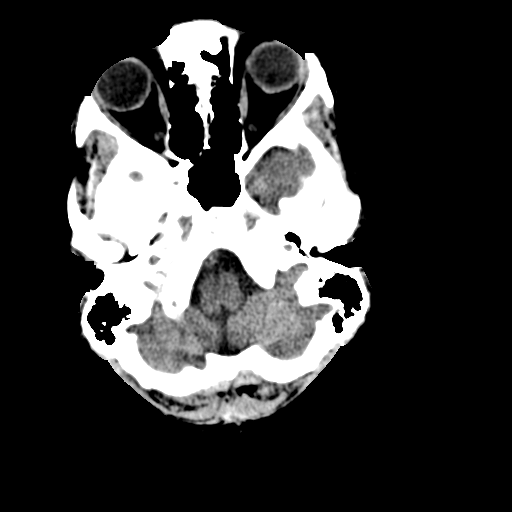
[im 6/30  brain]
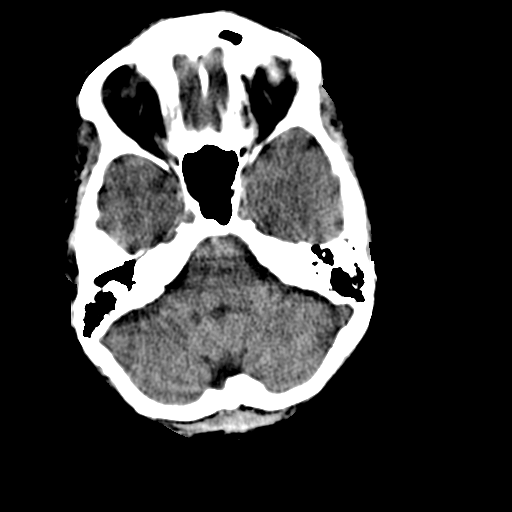
[im 8/30  brain]
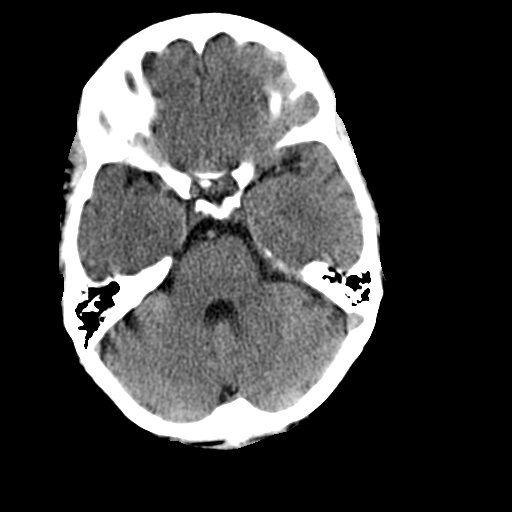
[im 9/30  brain]
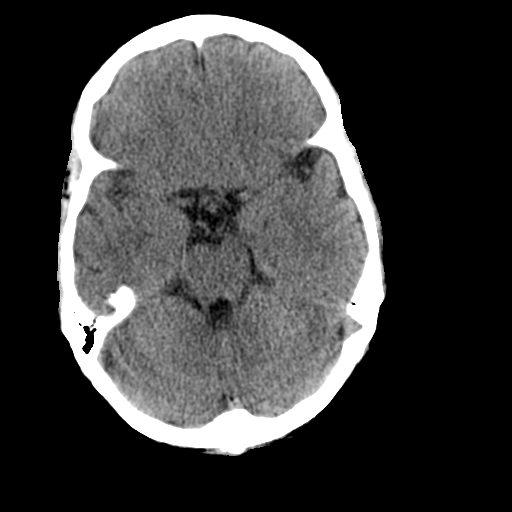
[im 9/30  bone]
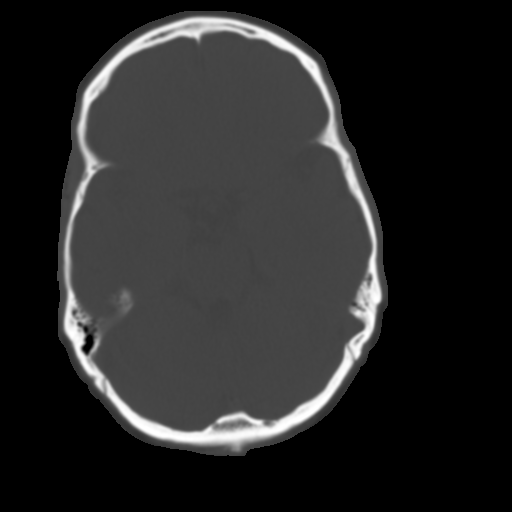
[im 11/30  brain]
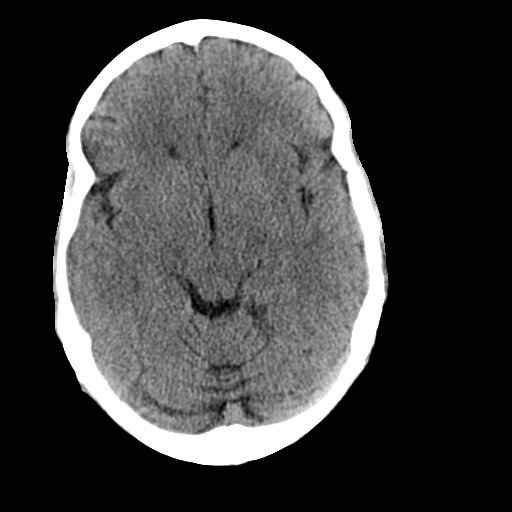
[im 13/30  brain]
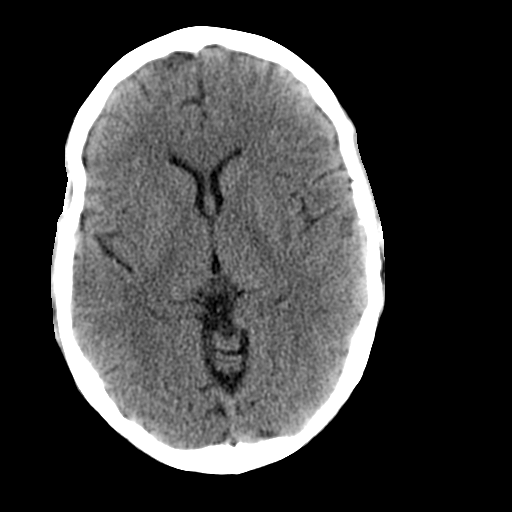
[im 15/30  brain]
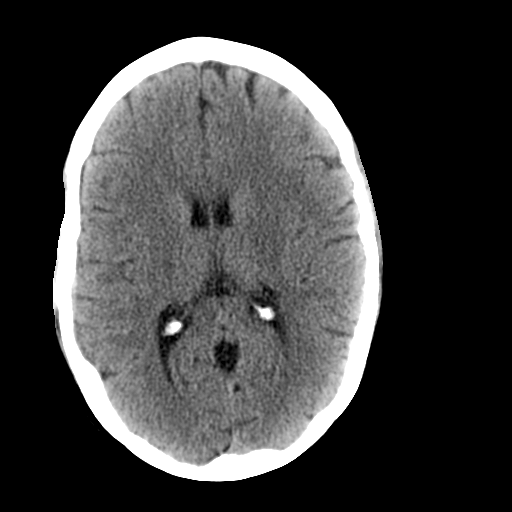
[im 16/30  brain]
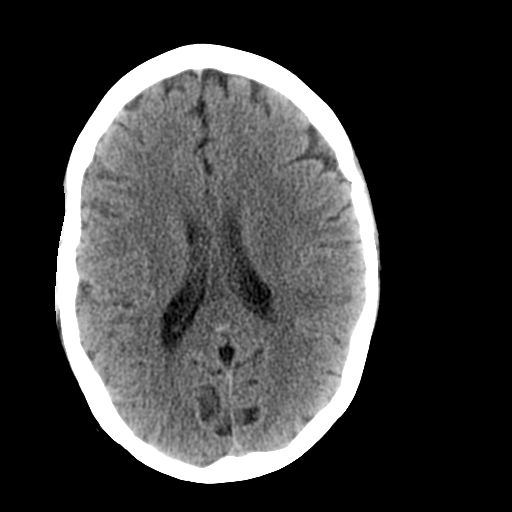
[im 16/30  bone]
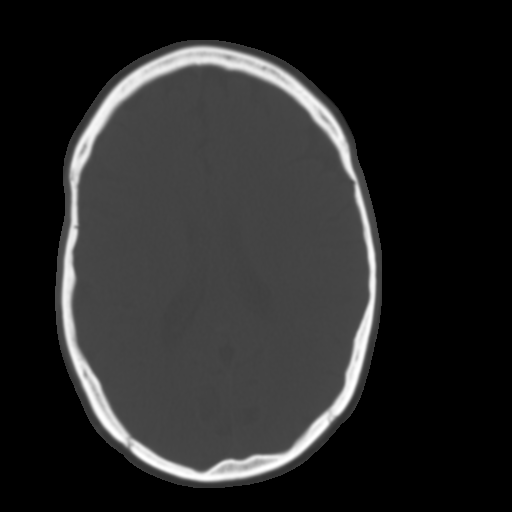
[im 18/30  brain]
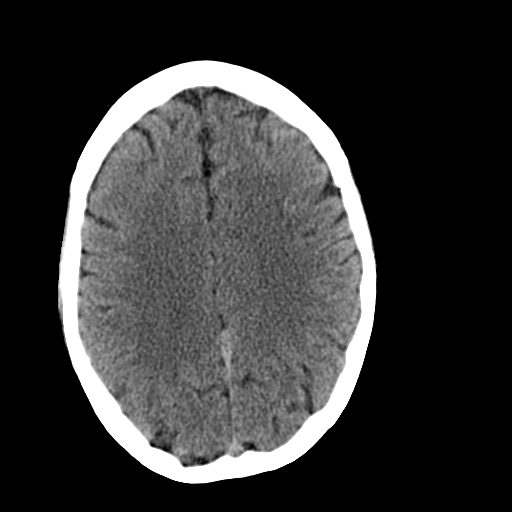
[im 20/30  brain]
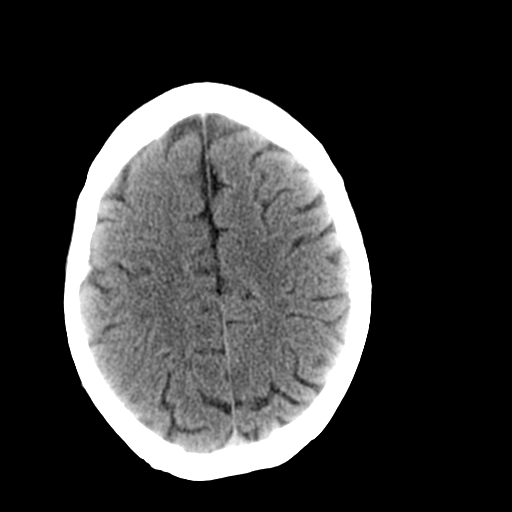
[im 22/30  brain]
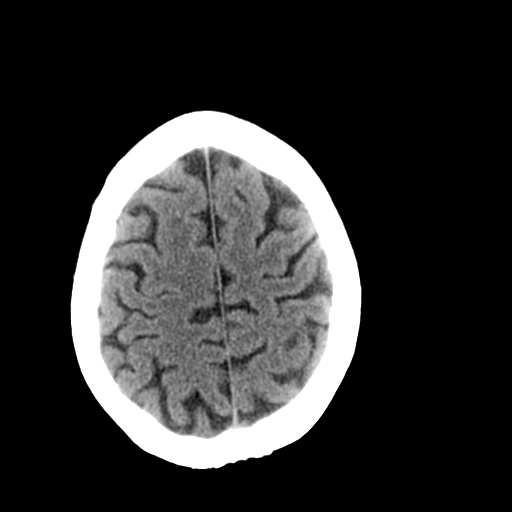
[im 23/30  brain]
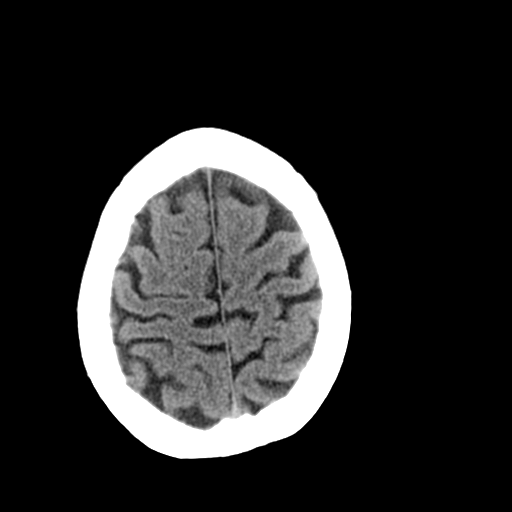
[im 23/30  bone]
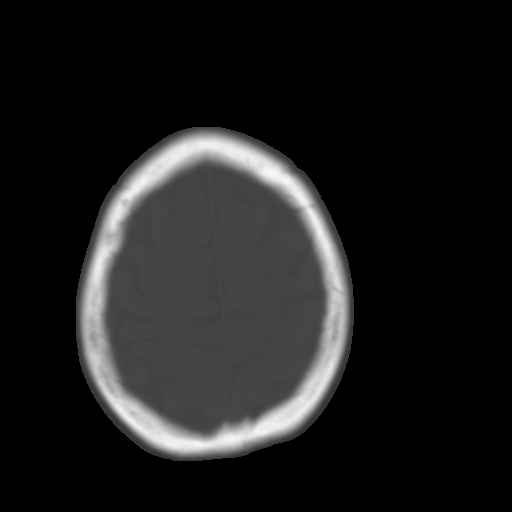
[im 25/30  brain]
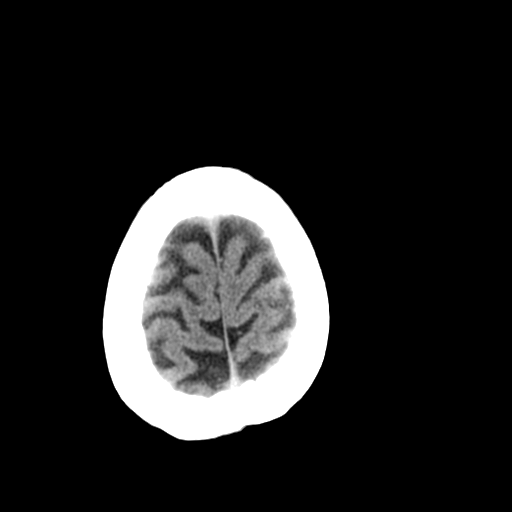
[im 27/30  brain]
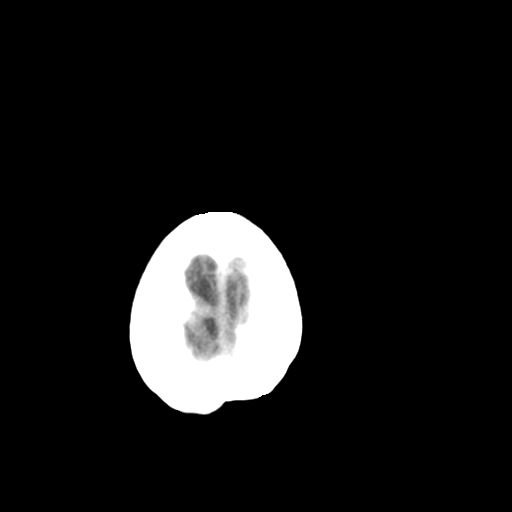
[im 29/30  brain]
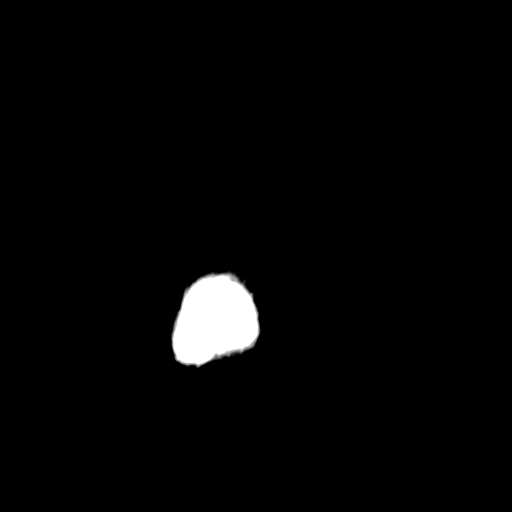

[16 of 30 positions shown; findings below may reference images not displayed]

FINDINGS: Normal ventricular morphology.
No midline shift or mass effect.
Normal appearance of brain parenchyma.
No intracranial hemorrhage, mass lesion, or acute infarction.
Visualized paranasal sinuses and mastoid air cells clear.
Bones unremarkable.
IMPRESSION: No acute intracranial abnormalities.

## 2014-05-03 ENCOUNTER — Encounter (HOSPITAL_COMMUNITY): Payer: Self-pay | Admitting: Emergency Medicine

## 2014-05-03 ENCOUNTER — Emergency Department (HOSPITAL_COMMUNITY)
Admission: EM | Admit: 2014-05-03 | Discharge: 2014-05-03 | Disposition: A | Discharge status: Home / Self Care | Payer: BLUE CROSS/BLUE SHIELD | Attending: Emergency Medicine | Admitting: Emergency Medicine

## 2014-05-03 DIAGNOSIS — M7072 Other bursitis of hip, left hip: Secondary | ICD-10-CM

## 2014-05-03 DIAGNOSIS — R1030 Lower abdominal pain, unspecified: Secondary | ICD-10-CM | POA: Insufficient documentation

## 2014-05-03 DIAGNOSIS — Z86018 Personal history of other benign neoplasm: Secondary | ICD-10-CM | POA: Diagnosis not present

## 2014-05-03 DIAGNOSIS — Z720 Tobacco use: Secondary | ICD-10-CM | POA: Insufficient documentation

## 2014-05-03 DIAGNOSIS — Z8659 Personal history of other mental and behavioral disorders: Secondary | ICD-10-CM | POA: Insufficient documentation

## 2014-05-03 DIAGNOSIS — Z8719 Personal history of other diseases of the digestive system: Secondary | ICD-10-CM | POA: Diagnosis not present

## 2014-05-03 DIAGNOSIS — Y939 Activity, unspecified: Secondary | ICD-10-CM | POA: Insufficient documentation

## 2014-05-03 DIAGNOSIS — M79605 Pain in left leg: Secondary | ICD-10-CM | POA: Diagnosis present

## 2014-05-03 MED ORDER — HYDROCODONE-ACETAMINOPHEN 5-325 MG PO TABS: ORAL_TABLET | ORAL | Status: DC

## 2014-05-03 MED ORDER — METHOCARBAMOL 500 MG PO TABS: 500.0000 mg | ORAL_TABLET | Freq: Three times a day (TID) | ORAL | Status: DC

## 2014-05-03 MED ORDER — KETOROLAC TROMETHAMINE 60 MG/2ML IM SOLN
60.0000 mg | Freq: Once | INTRAMUSCULAR | Status: AC
  Administered 2014-05-03: 60 mg via INTRAMUSCULAR
  Filled 2014-05-03: qty 2

## 2014-05-03 NOTE — Discharge Instructions (Signed)
Bursitis Bursitis is when the fluid-filled sac (bursa) that covers and protects a joint gets puffy and irritated. The elbow, shoulder, hip, and knee joints are most often affected. HOME CARE  Put ice on the area.  Put ice in a plastic bag.  Place a towel between your skin and the bag.  Leave the ice on for 15-20 minutes, 03-04 times a day.  Put the joint through a full range of motion 4 times a day. Rest the injured joint at other times. When you have less pain, begin slow movements and usual activities.  Only take medicine as told by your doctor.  Follow up with your doctor. Any delay in care could stop the bursitis from healing. This could cause long-term pain. GET HELP RIGHT AWAY IF:   You have more pain with treatment.  You have a temperature by mouth above 102 F (38.9 C), not controlled by medicine.  You have heat and irritation over the fluid-filled sac. MAKE SURE YOU:   Understand these instructions.  Will watch your condition.  Will get help right away if you are not doing well or get worse. Document Released: 07/12/2009 Document Revised: 04/16/2011 Document Reviewed: 04/13/2013 [facility] Patient Information 2015 [place], [place]. This information is not intended to replace advice given to you by your health care provider. Make sure you discuss any questions you have with your health care provider.  Hip Bursitis Bursitis is a puffiness (swelling) and soreness of a fluid-filled sac (bursa). This sac covers and protects the joint. HOME CARE  Put ice on the injured area.  Put ice in a plastic bag.  Place a towel between your skin and the bag.  Leave the ice on for 15-20 minutes, 03-04 times a day.  Rest the painful joint as much as possible. Move your joint at least 4 times a day. When pain lessens, start normal, slow movements and normal activities.  Only take medicine as told by your doctor.  Use crutches as told.  Raise (elevate) your painful joint. Use  pillows for propping your legs and hips.  Get a massage to lessen pain. GET HELP RIGHT AWAY IF:  Your pain increases or does not improve during treatment.  You have a fever.  You feel heat coming from the affected area.  You see redness and puffiness around the affected area.  You have any questions or concerns. MAKE SURE YOU:  Understand these instructions.  Will watch your condition.  Will get help right away if you are not well or get worse. Document Released: 02/24/2010 Document Revised: 04/16/2011 Document Reviewed: 02/24/2010 [facility] Patient Information 2015 [place], [place]. This information is not intended to replace advice given to you by your health care provider. Make sure you discuss any questions you have with your health care provider.

## 2014-05-03 NOTE — ED Provider Notes (Signed)
CSN: [id number]     Arrival date & time 05/03/14  0909 History   First MD Initiated Contact with Patient 05/03/14 [phone]     Chief Complaint  Patient presents with  . Leg Pain     (Consider location/radiation/quality/duration/timing/severity/associated sxs/prior Treatment) HPI   Monica Soto is a 49 y.o. female who presents to the Emergency Department complaining of left hip and thigh pain.  She states that she gradually began to notice pain in her groin and upper thigh with movement of the left leg.  She states that she took a hot bath and soaked her leg and took ibuprofen which helped the pain intermittently.  This morning she reports that pain has progressed and she now has pain that is worse with any movement of the leg or weight bearing.  She denies known injury, swelling, numbness, discoloration or any symptoms below the left knee.     Past Medical History  Diagnosis Date  . Microscopic colitis 04/26/2010    Colonoscopy by Dr. Gala Romney, good response with Entocort  . Erosive esophagitis 04/26/10    EGD by Dr. Gala Romney, small hiatal hernia  . Tubular adenoma of colon 04/26/10    Junction of descending and sigmoid 40 CM from anus  . Depression   . IBS (irritable bowel syndrome)     Diarrhea predominant   Past Surgical History  Procedure Laterality Date  . Appendectomy  2/11    Dr. Romona Curls with a delayed closure  . Tubal ligation    . Tonsillectomy    . Abdominal hysterectomy     Family History  Problem Relation Age of Onset  . Diabetes Mother   . Coronary artery disease Mother    History  Substance Use Topics  . Smoking status: Current Every Day Smoker -- 0.50 packs/day  . Smokeless tobacco: Not on file  . Alcohol Use: 0.6 oz/week    1 Glasses of wine per week     Comment: seldom   OB History    No data available     Review of Systems  Constitutional: Negative for fever and chills.  Gastrointestinal: Negative for nausea, vomiting, abdominal pain and abdominal  distention.  Genitourinary: Negative for dysuria, flank pain, decreased urine volume, vaginal bleeding, difficulty urinating and pelvic pain.  Musculoskeletal: Positive for arthralgias (left groin and hip tenderness). Negative for back pain and joint swelling.  Skin: Negative for color change and wound.  All other systems reviewed and are negative.     Allergies  Review of patient's allergies indicates no known allergies.  Home Medications   Prior to Admission medications   Medication Sig Start Date End Date Taking? Authorizing Provider  Aspirin-Acetaminophen-Caffeine (GOODY HEADACHE PO) Take 1 packet by mouth 2 (two) times daily as needed (for pain).   Yes Historical Provider, MD  ibuprofen (ADVIL,MOTRIN) 200 MG tablet Take 200 mg by mouth every 6 (six) hours as needed for headache.   Yes Historical Provider, MD  ciprofloxacin (CIPRO) 500 MG tablet Take 1 tablet (500 mg total) by mouth 2 (two) times daily. One po bid x 7 days Patient not taking: Reported on 05/03/2014 04/20/13   Milton Ferguson, MD  dexlansoprazole (DEXILANT) 60 MG capsule Take 1 capsule (60 mg total) by mouth daily. Patient not taking: Reported on 05/03/2014 05/16/10 04/20/13  Orvil Feil, NP   BP 108/87 mmHg  Pulse 88  Temp(Src) 98.5 F (36.9 C) (Oral)  Resp 18  Ht 5\' 5"  (1.651 m)  Wt 150 lb (68.04  kg)  BMI 24.96 kg/m2  SpO2 98% Physical Exam  Constitutional: She is oriented to person, place, and time. She appears well-developed and well-nourished. No distress.  HENT:  Head: Normocephalic and atraumatic.  Cardiovascular: Normal rate, regular rhythm, normal heart sounds and intact distal pulses.   No murmur heard. Pulmonary/Chest: Effort normal and breath sounds normal. No respiratory distress.  Abdominal: Soft. She exhibits no distension. There is no tenderness. There is no rebound and no guarding.  Musculoskeletal: She exhibits tenderness. She exhibits no edema.       Left hip: She exhibits tenderness. She  exhibits normal strength, no bony tenderness, no swelling, no crepitus and no deformity.  Localized ttp of the anterior left hip and groin.  Pain reproduced with SLR and internal/external rotation of the hip.  No lymphadenopathy or erythema.  Pt also has mild tenderness of the quadricep muscles.    Neurological: She is alert and oriented to person, place, and time. She exhibits normal muscle tone. Coordination normal.  Skin: Skin is warm and dry.  Psychiatric: She has a normal mood and affect.  Nursing note and vitals reviewed.   ED Course  Procedures (including critical care time) Labs Review Labs Reviewed - No data to display  Imaging Review No results found.   EKG Interpretation None      MDM   Final diagnoses:  Bursitis, hip, left    Gradual onset of left hip, groin and upper thigh pain that reproduces with movement and palpation.  Pt noted improvement of sx's after heat and NSAID.  No h/o injury, recent illness or fever to suggest septic joint or indication for need for imaging.  Sx's likely related to bursitis.  She agrees to symptomatic treatment and close f/u.  Will give referral to orthopedics and advised pt to return here if sx's worsen.    pt feeling better after IM Toradol and ambulates with a steady gait.  No focal neuro deficits.     Patrice Paradise, PA-C 05/04/14 2219  Virgel Manifold, MD 05/05/14 [phone]

## 2014-05-03 NOTE — ED Notes (Signed)
Started with leg cramp to left leg yesterday when getting out of bed.  Rates pain 10/10.  Took ibuprofen without relief.

## 2014-05-03 NOTE — ED Notes (Signed)
Patient with no complaints at this time. Respirations even and unlabored. Skin warm/dry. Discharge instructions reviewed with patient at this time. Patient given opportunity to voice concerns/ask questions. Patient discharged at this time and left Emergency Department with steady gait.   

## 2014-07-01 ENCOUNTER — Other Ambulatory Visit: Payer: Self-pay | Admitting: Radiology

## 2014-09-23 ENCOUNTER — Emergency Department (HOSPITAL_COMMUNITY)
Admission: EM | Admit: 2014-09-23 | Discharge: 2014-09-23 | Disposition: A | Discharge status: Home / Self Care | Payer: 59 | Attending: Emergency Medicine | Admitting: Emergency Medicine

## 2014-09-23 ENCOUNTER — Encounter (HOSPITAL_COMMUNITY): Payer: Self-pay | Admitting: Emergency Medicine

## 2014-09-23 DIAGNOSIS — R21 Rash and other nonspecific skin eruption: Secondary | ICD-10-CM | POA: Diagnosis present

## 2014-09-23 DIAGNOSIS — Z8659 Personal history of other mental and behavioral disorders: Secondary | ICD-10-CM | POA: Insufficient documentation

## 2014-09-23 DIAGNOSIS — Z86018 Personal history of other benign neoplasm: Secondary | ICD-10-CM | POA: Diagnosis not present

## 2014-09-23 DIAGNOSIS — Z720 Tobacco use: Secondary | ICD-10-CM | POA: Insufficient documentation

## 2014-09-23 DIAGNOSIS — Z8719 Personal history of other diseases of the digestive system: Secondary | ICD-10-CM | POA: Diagnosis not present

## 2014-09-23 DIAGNOSIS — L255 Unspecified contact dermatitis due to plants, except food: Secondary | ICD-10-CM | POA: Diagnosis not present

## 2014-09-23 MED ORDER — PREDNISONE 50 MG PO TABS
60.0000 mg | ORAL_TABLET | Freq: Once | ORAL | Status: AC
  Administered 2014-09-23: 60 mg via ORAL
  Filled 2014-09-23 (×2): qty 1

## 2014-09-23 MED ORDER — HYDROXYZINE HCL 25 MG PO TABS
25.0000 mg | ORAL_TABLET | Freq: Four times a day (QID) | ORAL | Status: DC
Start: 2014-09-23 — End: 2014-12-08

## 2014-09-23 MED ORDER — PREDNISONE 10 MG PO TABS: ORAL_TABLET | ORAL | Status: DC

## 2014-09-23 MED ORDER — TRIAMCINOLONE ACETONIDE 0.1 % EX CREA: 1.0000 "application " | TOPICAL_CREAM | Freq: Two times a day (BID) | CUTANEOUS | Status: DC

## 2014-09-23 NOTE — ED Notes (Signed)
Pt states that she has poison oak.

## 2014-09-23 NOTE — ED Provider Notes (Signed)
CSN: [id number]     Arrival date & time 09/23/14  1217 History   First MD Initiated Contact with Patient 09/23/14 1345     Chief Complaint  Patient presents with  . Rash     (Consider location/radiation/quality/duration/timing/severity/associated sxs/prior Treatment) Patient is a 49 y.o. female presenting with rash. The history is provided by the patient.  Rash Location: Face, right and left leg, right and left arm, buttocks. Quality: blistering, itchiness and redness   Severity:  Moderate Onset quality:  Gradual Duration:  3 days Timing:  Constant Progression:  Worsening Chronicity:  New Context: plant contact   Relieved by:  Nothing Ineffective treatments:  Anti-itch cream and topical steroids Associated symptoms: no shortness of breath, no sore throat, no throat swelling, no tongue swelling and not wheezing     Past Medical History  Diagnosis Date  . Microscopic colitis 04/26/2010    Colonoscopy by Dr. Gala Romney, good response with Entocort  . Erosive esophagitis 04/26/10    EGD by Dr. Gala Romney, small hiatal hernia  . Tubular adenoma of colon 04/26/10    Junction of descending and sigmoid 40 CM from anus  . Depression   . IBS (irritable bowel syndrome)     Diarrhea predominant   Past Surgical History  Procedure Laterality Date  . Appendectomy  2/11    Dr. Romona Curls with a delayed closure  . Tubal ligation    . Tonsillectomy    . Abdominal hysterectomy     Family History  Problem Relation Age of Onset  . Diabetes Mother   . Coronary artery disease Mother    Social History  Substance Use Topics  . Smoking status: Current Every Day Smoker -- 0.50 packs/day  . Smokeless tobacco: None  . Alcohol Use: 0.6 oz/week    1 Glasses of wine per week     Comment: seldom   OB History    No data available     Review of Systems  HENT: Negative for sore throat.   Respiratory: Negative for shortness of breath and wheezing.   Skin: Positive for rash.  All other systems reviewed  and are negative.     Allergies  Review of patient's allergies indicates no known allergies.  Home Medications   Prior to Admission medications   Medication Sig Start Date End Date Taking? Authorizing Provider  Aspirin-Acetaminophen-Caffeine (GOODY HEADACHE PO) Take 1 packet by mouth 2 (two) times daily as needed (for pain).   Yes Historical Provider, MD  diphenhydrAMINE (BENADRYL) 25 mg capsule Take 25 mg by mouth every 6 (six) hours as needed.   Yes Historical Provider, MD  diphenhydramine-calamine (CALADRYL) 1-8 % LOTN Apply 1 application topically 2 (two) times daily as needed.   Yes Historical Provider, MD  ibuprofen (ADVIL,MOTRIN) 200 MG tablet Take 200 mg by mouth every 6 (six) hours as needed for headache.   Yes Historical Provider, MD  HYDROcodone-acetaminophen (NORCO/VICODIN) 5-325 MG per tablet Take one-two tabs po q 4-6 hrs prn pain Patient not taking: Reported on 09/23/2014 05/03/14   Tammy Triplett, PA-C  methocarbamol (ROBAXIN) 500 MG tablet Take 1 tablet (500 mg total) by mouth 3 (three) times daily. Patient not taking: Reported on 09/23/2014 05/03/14   Tammy Triplett, PA-C   BP 138/87 mmHg  Pulse 89  Temp(Src) 98.5 F (36.9 C) (Oral)  Resp 16  Ht 5\' 5"  (1.651 m)  Wt 150 lb (68.04 kg)  BMI 24.96 kg/m2  SpO2 97% Physical Exam  Constitutional: She is oriented to person, place,  and time. She appears well-developed and well-nourished.  Non-toxic appearance.  HENT:  Head: Normocephalic.  Right Ear: Tympanic membrane and external ear normal.  Left Ear: Tympanic membrane and external ear normal.  Eyes: EOM and lids are normal. Pupils are equal, round, and reactive to light.  Neck: Normal range of motion. Neck supple. Carotid bruit is not present.  Cardiovascular: Normal rate, regular rhythm, normal heart sounds, intact distal pulses and normal pulses.   Pulmonary/Chest: Breath sounds normal. No respiratory distress.  Abdominal: Soft. Bowel sounds are normal. There is no  tenderness. There is no guarding.  Musculoskeletal: Normal range of motion.  Lymphadenopathy:       Head (right side): No submandibular adenopathy present.       Head (left side): No submandibular adenopathy present.    She has no cervical adenopathy.  Neurological: She is alert and oriented to person, place, and time. She has normal strength. No cranial nerve deficit or sensory deficit.  Skin: Skin is warm and dry.  Patient has a red macular rash, some with blisters on the right left face, right and left upper extremity, right and left lower extremity.  Psychiatric: She has a normal mood and affect. Her speech is normal.  Nursing note and vitals reviewed.   ED Course  Procedures (including critical care time) Labs Review Labs Reviewed - No data to display  Imaging Review No results found. I have personally reviewed and evaluated these images and lab results as part of my medical decision-making.   EKG Interpretation None      MDM  Vital signs within normal limits. Pulse oximetry is 97%. The airway is patent. The lungs are clear. The examination is consistent with a contact dermatitis. Probably from a plant. The patient will be treated with prednisone taper, triamcinolone cream, Vistaril for itching.    Final diagnoses:  None    *I have reviewed nursing notes, vital signs, and all appropriate lab and imaging results for this patient.[address] PA-C 09/23/14 [place], MD 09/23/14 1416

## 2014-09-23 NOTE — ED Notes (Signed)
Pt reports she thinks she got into some poison oak while walking her dog Monday.  Reports rash to legs, arms, buttocks, and face.  Pt says gets poison oak frequently and can usually use caladryl lotion and benadryl but says this time it isn't helping.

## 2014-09-23 NOTE — Discharge Instructions (Signed)

## 2014-12-08 ENCOUNTER — Emergency Department (HOSPITAL_COMMUNITY)
Admission: EM | Admit: 2014-12-08 | Discharge: 2014-12-08 | Disposition: A | Discharge status: Home / Self Care | Payer: 59 | Attending: Emergency Medicine | Admitting: Emergency Medicine

## 2014-12-08 ENCOUNTER — Encounter (HOSPITAL_COMMUNITY): Payer: Self-pay | Admitting: *Deleted

## 2014-12-08 DIAGNOSIS — Z8659 Personal history of other mental and behavioral disorders: Secondary | ICD-10-CM | POA: Diagnosis not present

## 2014-12-08 DIAGNOSIS — R300 Dysuria: Secondary | ICD-10-CM | POA: Insufficient documentation

## 2014-12-08 DIAGNOSIS — Z87440 Personal history of urinary (tract) infections: Secondary | ICD-10-CM | POA: Insufficient documentation

## 2014-12-08 DIAGNOSIS — Z86018 Personal history of other benign neoplasm: Secondary | ICD-10-CM | POA: Diagnosis not present

## 2014-12-08 DIAGNOSIS — R3911 Hesitancy of micturition: Secondary | ICD-10-CM | POA: Diagnosis not present

## 2014-12-08 DIAGNOSIS — Z7952 Long term (current) use of systemic steroids: Secondary | ICD-10-CM | POA: Insufficient documentation

## 2014-12-08 DIAGNOSIS — R350 Frequency of micturition: Secondary | ICD-10-CM | POA: Diagnosis not present

## 2014-12-08 DIAGNOSIS — F17200 Nicotine dependence, unspecified, uncomplicated: Secondary | ICD-10-CM | POA: Insufficient documentation

## 2014-12-08 DIAGNOSIS — Z8719 Personal history of other diseases of the digestive system: Secondary | ICD-10-CM | POA: Diagnosis not present

## 2014-12-08 LAB — WET PREP, GENITAL
Clue Cells Wet Prep HPF POC: NONE SEEN
Trich, Wet Prep: NONE SEEN
Yeast Wet Prep HPF POC: NONE SEEN

## 2014-12-08 LAB — URINALYSIS, ROUTINE W REFLEX MICROSCOPIC
Bilirubin Urine: NEGATIVE
Glucose, UA: NEGATIVE mg/dL
Ketones, ur: NEGATIVE mg/dL
Leukocytes, UA: NEGATIVE
Nitrite: NEGATIVE
Protein, ur: NEGATIVE mg/dL
Specific Gravity, Urine: 1.02 (ref 1.005–1.030)
Urobilinogen, UA: 0.2 mg/dL (ref 0.0–1.0)
pH: 6.5 (ref 5.0–8.0)

## 2014-12-08 LAB — URINE MICROSCOPIC-ADD ON

## 2014-12-08 MED ORDER — CEPHALEXIN 500 MG PO CAPS
500.0000 mg | ORAL_CAPSULE | Freq: Once | ORAL | Status: AC
  Administered 2014-12-08: 500 mg via ORAL
  Filled 2014-12-08: qty 1

## 2014-12-08 MED ORDER — CEPHALEXIN 500 MG PO CAPS: 500.0000 mg | ORAL_CAPSULE | Freq: Four times a day (QID) | ORAL | Status: DC

## 2014-12-08 NOTE — ED Provider Notes (Signed)
CSN: [id number]     Arrival date & time 12/08/14  1625 History   First MD Initiated Contact with Patient 12/08/14 1634     Chief Complaint  Patient presents with  . Dysuria     (Consider location/radiation/quality/duration/timing/severity/associated sxs/prior Treatment) HPI   Monica Soto is a 49 y.o. female who presents to the Emergency Department complaining of burning with urination and urinary hesitancy and increased frequency for two days.  She states that she gets urinary tract infections yearly and her current symptoms feel similar to previous.  She also notes dark color to her urine.  She has taken OTC medications without relief, but has not taken AZO.  She describes "pressure" sensation to her lower abdomen.  She denies fever, back pain, chills, vaginal bleeding, discharge, and genital lesions.    Past Medical History  Diagnosis Date  . Microscopic colitis 04/26/2010    Colonoscopy by Dr. Gala Romney, good response with Entocort  . Erosive esophagitis 04/26/10    EGD by Dr. Gala Romney, small hiatal hernia  . Tubular adenoma of colon 04/26/10    Junction of descending and sigmoid 40 CM from anus  . Depression   . IBS (irritable bowel syndrome)     Diarrhea predominant   Past Surgical History  Procedure Laterality Date  . Appendectomy  2/11    Dr. Romona Curls with a delayed closure  . Tubal ligation    . Tonsillectomy    . Abdominal hysterectomy     Family History  Problem Relation Age of Onset  . Diabetes Mother   . Coronary artery disease Mother    Social History  Substance Use Topics  . Smoking status: Current Every Day Smoker -- 0.50 packs/day  . Smokeless tobacco: None  . Alcohol Use: 0.6 oz/week    1 Glasses of wine per week     Comment: seldom   OB History    No data available     Review of Systems  Constitutional: Negative for fever, chills, activity change and appetite change.  Respiratory: Negative for chest tightness and shortness of breath.   Gastrointestinal:  Negative for nausea, vomiting and abdominal pain ("pressure" lower abdomen).  Genitourinary: Positive for dysuria, urgency and frequency. Negative for hematuria, flank pain, decreased urine volume, vaginal bleeding, vaginal discharge and difficulty urinating.  Musculoskeletal: Negative for back pain.  Skin: Negative for rash.  Neurological: Negative for dizziness, weakness and numbness.  Hematological: Negative for adenopathy.  Psychiatric/Behavioral: Negative for confusion.  All other systems reviewed and are negative.     Allergies  Review of patient's allergies indicates no known allergies.  Home Medications   Prior to Admission medications   Medication Sig Start Date End Date Taking? Authorizing Provider  Aspirin-Acetaminophen-Caffeine (GOODY HEADACHE PO) Take 1 packet by mouth 2 (two) times daily as needed (for pain).    Historical Provider, MD  diphenhydrAMINE (BENADRYL) 25 mg capsule Take 25 mg by mouth every 6 (six) hours as needed.    Historical Provider, MD  diphenhydramine-calamine (CALADRYL) 1-8 % LOTN Apply 1 application topically 2 (two) times daily as needed.    Historical Provider, MD  HYDROcodone-acetaminophen (NORCO/VICODIN) 5-325 MG per tablet Take one-two tabs po q 4-6 hrs prn pain Patient not taking: Reported on 09/23/2014 05/03/14   Macklyn Glandon, PA-C  hydrOXYzine (ATARAX/VISTARIL) 25 MG tablet Take 1 tablet (25 mg total) by mouth every 6 (six) hours. 09/23/14   Lily Kocher, PA-C  ibuprofen (ADVIL,MOTRIN) 200 MG tablet Take 200 mg by mouth every 6 (  six) hours as needed for headache.    Historical Provider, MD  methocarbamol (ROBAXIN) 500 MG tablet Take 1 tablet (500 mg total) by mouth 3 (three) times daily. Patient not taking: Reported on 09/23/2014 05/03/14   Libertie Hausler, PA-C  predniSONE (DELTASONE) 10 MG tablet 5,4,3,2,1 - take with food 09/23/14   Lily Kocher, PA-C  triamcinolone cream (KENALOG) 0.1 % Apply 1 application topically 2 (two) times daily. 09/23/14    Lily Kocher, PA-C   BP 128/75 mmHg  Pulse 78  Temp(Src) 97.7 F (36.5 C) (Oral)  Resp 15  Ht 5\' 2"  (1.575 m)  Wt 155 lb (70.308 kg)  BMI 28.34 kg/m2  SpO2 99% Physical Exam  Constitutional: She is oriented to person, place, and time. She appears well-developed and well-nourished. No distress.  HENT:  Head: Normocephalic and atraumatic.  Cardiovascular: Normal rate, regular rhythm, normal heart sounds and intact distal pulses.   No murmur heard. Pulmonary/Chest: Effort normal and breath sounds normal. No respiratory distress. She has no wheezes. She has no rales.  Abdominal: Soft. Normal appearance. She exhibits no distension and no mass. There is no hepatosplenomegaly. There is no tenderness. There is no rigidity, no rebound, no guarding, no CVA tenderness and no tenderness at McBurney's point.   No CVA tenderness  Genitourinary: Right adnexum displays no mass and no tenderness. Left adnexum displays no mass and no tenderness. No tenderness or bleeding in the vagina. No foreign body around the vagina. No vaginal discharge found.  S/p hysterectomy  Musculoskeletal: Normal range of motion. She exhibits no edema.  Neurological: She is alert and oriented to person, place, and time. Coordination normal.  Skin: Skin is warm and dry. No rash noted.  Nursing note and vitals reviewed.   ED Course  Procedures (including critical care time) Labs Review Labs Reviewed  WET PREP, GENITAL - Abnormal; Notable for the following:    WBC, Wet Prep HPF POC FEW (*)    All other components within normal limits  URINALYSIS, ROUTINE W REFLEX MICROSCOPIC (NOT AT [facility]) - Abnormal; Notable for the following:    Hgb urine dipstick TRACE (*)    All other components within normal limits  URINE CULTURE  URINE MICROSCOPIC-ADD ON  GC/CHLAMYDIA PROBE AMP ([place]) NOT AT [facility]    I have personally reviewed and evaluated the lab results as part of my medical decision-making.    MDM   Final  diagnoses:  Dysuria   Pt is well appearing.  Vitals stable.  No concerning sx's for torsion, TOA.  abd is soft, NT. Pt has recurrent UTI's and reports similar sx's today.  I will tx with keflex and cultures are pending.  I have advised her of importance of close GYN f/u or to return here if her sx's worsen.  She agrees to plan and appears stable for d/c      Kem Parkinson, PA-C 12/09/14 0024  Tanna Furry, MD 12/30/14 [phone]

## 2014-12-08 NOTE — ED Notes (Signed)
Patient reports dysuria and urinary frequency since Monday.

## 2014-12-08 NOTE — ED Notes (Signed)
Pt states understanding of care given and follow up instructions 

## 2014-12-08 NOTE — Discharge Instructions (Signed)

## 2014-12-10 LAB — URINE CULTURE

## 2014-12-10 LAB — GC/CHLAMYDIA PROBE AMP (~~LOC~~) NOT AT ARMC
CHLAMYDIA, DNA PROBE: NEGATIVE
Neisseria Gonorrhea: NEGATIVE

## 2015-03-08 ENCOUNTER — Encounter (HOSPITAL_COMMUNITY): Payer: Self-pay | Admitting: Emergency Medicine

## 2015-03-08 ENCOUNTER — Emergency Department (INDEPENDENT_AMBULATORY_CARE_PROVIDER_SITE_OTHER)
Admission: EM | Admit: 2015-03-08 | Discharge: 2015-03-08 | Disposition: A | Discharge status: Home / Self Care | Payer: BLUE CROSS/BLUE SHIELD | Source: Home / Self Care | Attending: Family Medicine | Admitting: Family Medicine

## 2015-03-08 DIAGNOSIS — T700XXA Otitic barotrauma, initial encounter: Secondary | ICD-10-CM | POA: Diagnosis not present

## 2015-03-08 DIAGNOSIS — J069 Acute upper respiratory infection, unspecified: Secondary | ICD-10-CM | POA: Diagnosis not present

## 2015-03-08 DIAGNOSIS — H6983 Other specified disorders of Eustachian tube, bilateral: Secondary | ICD-10-CM | POA: Diagnosis not present

## 2015-03-08 NOTE — ED Notes (Signed)
Sinus congestion that started yesterday.  Tylenol cold and sinus medicine.

## 2015-03-08 NOTE — ED Provider Notes (Signed)
CSN: [id number]     Arrival date & time 03/08/15  1358 History   First MD Initiated Contact with Patient 03/08/15 1456     Chief Complaint  Patient presents with  . URI   (Consider location/radiation/quality/duration/timing/severity/associated sxs/prior Treatment) HPI Comments: 50 year old female that developed upper respiratory congestion, sneezing, headache and bilateral earaches with nausea yesterday. Denies fever.   Past Medical History  Diagnosis Date  . Microscopic colitis 04/26/2010    Colonoscopy by Dr. Gala Romney, good response with Entocort  . Erosive esophagitis 04/26/10    EGD by Dr. Gala Romney, small hiatal hernia  . Tubular adenoma of colon 04/26/10    Junction of descending and sigmoid 40 CM from anus  . Depression   . IBS (irritable bowel syndrome)     Diarrhea predominant   Past Surgical History  Procedure Laterality Date  . Appendectomy  2/11    Dr. Romona Curls with a delayed closure  . Tubal ligation    . Tonsillectomy    . Abdominal hysterectomy     Family History  Problem Relation Age of Onset  . Diabetes Mother   . Coronary artery disease Mother    Social History  Substance Use Topics  . Smoking status: Current Every Day Smoker -- 0.50 packs/day  . Smokeless tobacco: Not on file  . Alcohol Use: 0.6 oz/week    1 Glasses of wine per week     Comment: seldom   OB History    No data available     Review of Systems  Constitutional: Positive for activity change. Negative for fever, chills, appetite change and fatigue.  HENT: Positive for congestion, postnasal drip and rhinorrhea. Negative for ear discharge, facial swelling and sore throat.   Eyes: Negative.   Respiratory: Negative.   Cardiovascular: Negative.   Gastrointestinal: Positive for nausea and vomiting.  Genitourinary: Negative.   Musculoskeletal: Negative for neck pain and neck stiffness.  Skin: Negative for pallor and rash.  Neurological: Positive for headaches. Negative for dizziness, tremors,  syncope and numbness.    Allergies  Review of patient's allergies indicates no known allergies.  Home Medications   Prior to Admission medications   Medication Sig Start Date End Date Taking? Authorizing Provider  Aspirin-Acetaminophen-Caffeine (GOODY HEADACHE PO) Take 1 packet by mouth 2 (two) times daily as needed (for pain).    Historical Provider, MD  cephALEXin (KEFLEX) 500 MG capsule Take 1 capsule (500 mg total) by mouth 4 (four) times daily. For 7 days 12/08/14   Tammy Triplett, PA-C  diphenhydrAMINE (BENADRYL) 25 mg capsule Take 25 mg by mouth every 6 (six) hours as needed for itching.     Historical Provider, MD  diphenhydramine-calamine (CALADRYL) 1-8 % LOTN Apply 1 application topically 2 (two) times daily as needed (itching).     Historical Provider, MD  ibuprofen (ADVIL,MOTRIN) 200 MG tablet Take 200 mg by mouth every 6 (six) hours as needed for headache.    Historical Provider, MD   Meds Ordered and Administered this Visit  Medications - No data to display  BP 169/92 mmHg  Pulse 79  Temp(Src) 98.3 F (36.8 C) (Oral)  Resp 16  SpO2 96% No data found.   Physical Exam  Constitutional: She is oriented to person, place, and time. She appears well-developed and well-nourished. No distress.  HENT:  Head: Normocephalic and atraumatic.  Bilateral TMs are retracted. Minor erythema on the right. No effusion. Oropharynx with minor erythema and cobblestoning. No exudates  Eyes: Conjunctivae and EOM are normal.  Neck: Normal range of motion. Neck supple.  Cardiovascular: Normal rate, regular rhythm and normal heart sounds.   Pulmonary/Chest: Effort normal and breath sounds normal. No respiratory distress. She has no wheezes. She has no rales.  Lymphadenopathy:    She has no cervical adenopathy.  Neurological: She is alert and oriented to person, place, and time. She exhibits normal muscle tone.  Skin: Skin is warm and dry.  Psychiatric: She has a normal mood and affect.   Nursing note and vitals reviewed.   ED Course  Procedures (including critical care time)  Labs Review Labs Reviewed - No data to display  Imaging Review No results found.   Visual Acuity Review  Right Eye Distance:   Left Eye Distance:   Bilateral Distance:    Right Eye Near:   Left Eye Near:    Bilateral Near:         MDM   1. URI (upper respiratory infection)   2. ETD (eustachian tube dysfunction), bilateral   3. Barotitis media, initial encounter     For nasal and head congestion may take Sudafed PE 10 mg every 4 hours as needed. Saline nasal spray used frequently. For drainage may use Allegra, Claritin or Zyrtec. If you need stronger medicine to stop drainage may take Chlor-Trimeton 2-4 mg every 4 hours. This may cause drowsiness. Ibuprofen 600 mg every 6 hours as needed for pain, discomfort or fever. Drink plenty of fluids and stay well-hydrated.   Janne Napoleon, NP 03/08/15 [place], NP 03/08/15 [phone]

## 2015-03-08 NOTE — Discharge Instructions (Signed)
Barotitis Media Barotitis media is inflammation of your middle ear. This occurs when the auditory tube (eustachian tube) leading from the back of your nose (nasopharynx) to your eardrum is blocked. This blockage may result from a cold, environmental allergies, or an upper respiratory infection. Unresolved barotitis media may lead to damage or hearing loss (barotrauma), which may become permanent. HOME CARE INSTRUCTIONS   Use medicines as recommended by your health care provider. Over-the-counter medicines will help unblock the canal and can help during times of air travel.  Do not put anything into your ears to clean or unplug them. Eardrops will not be helpful.  Do not swim, dive, or fly until your health care provider says it is all right to do so. If these activities are necessary, chewing gum with frequent, forceful swallowing may help. It is also helpful to hold your nose and gently blow to pop your ears for equalizing pressure changes. This forces air into the eustachian tube.  Only take over-the-counter or prescription medicines for pain, discomfort, or fever as directed by your health care provider.  A decongestant may be helpful in decongesting the middle ear and make pressure equalization easier. SEEK MEDICAL CARE IF:  You experience a serious form of dizziness in which you feel as if the room is spinning and you feel nauseated (vertigo).  Your symptoms only involve one ear. SEEK IMMEDIATE MEDICAL CARE IF:   You develop a severe headache, dizziness, or severe ear pain.  You have bloody or pus-like drainage from your ears.  You develop a fever.  Your problems do not improve or become worse. MAKE SURE YOU:   Understand these instructions.  Will watch your condition.  Will get help right away if you are not doing well or get worse.   This information is not intended to replace advice given to you by your health care provider. Make sure you discuss any questions you have with  your health care provider.   Document Released: 01/20/2000 Document Revised: 11/12/2012 Document Reviewed: 08/19/2012 Elsevier Interactive Patient Education 2016 [place].  Upper Respiratory Infection, Adult For nasal and head congestion may take Sudafed PE 10 mg every 4 hours as needed. Saline nasal spray used frequently. For drainage may use Allegra, Claritin or Zyrtec. If you need stronger medicine to stop drainage may take Chlor-Trimeton 2-4 mg every 4 hours. This may cause drowsiness. Ibuprofen 600 mg every 6 hours as needed for pain, discomfort or fever. Drink plenty of fluids and stay well-hydrated. Most upper respiratory infections (URIs) are a viral infection of the air passages leading to the lungs. A URI affects the nose, throat, and upper air passages. The most common type of URI is nasopharyngitis and is typically referred to as "the common cold." URIs run their course and usually go away on their own. Most of the time, a URI does not require medical attention, but sometimes a bacterial infection in the upper airways can follow a viral infection. This is called a secondary infection. Sinus and middle ear infections are common types of secondary upper respiratory infections. Bacterial pneumonia can also complicate a URI. A URI can worsen asthma and chronic obstructive pulmonary disease (COPD). Sometimes, these complications can require emergency medical care and may be life threatening.  CAUSES Almost all URIs are caused by viruses. A virus is a type of germ and can spread from one person to another.  RISKS FACTORS You may be at risk for a URI if:   You smoke.  You have chronic heart or lung disease.  You have a weakened defense (immune) system.   You are very young or very old.   You have nasal allergies or asthma.  You work in crowded or poorly ventilated areas.  You work in health care facilities or schools. SIGNS AND SYMPTOMS  Symptoms typically develop 2-3  days after you come in contact with a cold virus. Most viral URIs last 7-10 days. However, viral URIs from the influenza virus (flu virus) can last 14-18 days and are typically more severe. Symptoms may include:   Runny or stuffy (congested) nose.   Sneezing.   Cough.   Sore throat.   Headache.   Fatigue.   Fever.   Loss of appetite.   Pain in your forehead, behind your eyes, and over your cheekbones (sinus pain).  Muscle aches.  DIAGNOSIS  Your health care provider may diagnose a URI by:  Physical exam.  Tests to check that your symptoms are not due to another condition such as:  Strep throat.  Sinusitis.  Pneumonia.  Asthma. TREATMENT  A URI goes away on its own with time. It cannot be cured with medicines, but medicines may be prescribed or recommended to relieve symptoms. Medicines may help:  Reduce your fever.  Reduce your cough.  Relieve nasal congestion. HOME CARE INSTRUCTIONS   Take medicines only as directed by your health care provider.   Gargle warm saltwater or take cough drops to comfort your throat as directed by your health care provider.  Use a warm mist humidifier or inhale steam from a shower to increase air moisture. This may make it easier to breathe.  Drink enough fluid to keep your urine clear or pale yellow.   Eat soups and other clear broths and maintain good nutrition.   Rest as needed.   Return to work when your temperature has returned to normal or as your health care provider advises. You may need to stay home longer to avoid infecting others. You can also use a face mask and careful hand washing to prevent spread of the virus.  Increase the usage of your inhaler if you have asthma.   Do not use any tobacco products, including cigarettes, chewing tobacco, or electronic cigarettes. If you need help quitting, ask your health care provider. PREVENTION  The best way to protect yourself from getting a cold is to  practice good hygiene.   Avoid oral or hand contact with people with cold symptoms.   Wash your hands often if contact occurs.  There is no clear evidence that vitamin C, vitamin E, echinacea, or exercise reduces the chance of developing a cold. However, it is always recommended to get plenty of rest, exercise, and practice good nutrition.  SEEK MEDICAL CARE IF:   You are getting worse rather than better.   Your symptoms are not controlled by medicine.   You have chills.  You have worsening shortness of breath.  You have brown or red mucus.  You have yellow or brown nasal discharge.  You have pain in your face, especially when you bend forward.  You have a fever.  You have swollen neck glands.  You have pain while swallowing.  You have white areas in the back of your throat. SEEK IMMEDIATE MEDICAL CARE IF:   You have severe or persistent:  Headache.  Ear pain.  Sinus pain.  Chest pain.  You have chronic lung disease and any of the following:  Wheezing.  Prolonged cough.  Coughing up blood.  A change in your usual mucus.  You have a stiff neck.  You have changes in your:  Vision.  Hearing.  Thinking.  Mood. MAKE SURE YOU:   Understand these instructions.  Will watch your condition.  Will get help right away if you are not doing well or get worse.   This information is not intended to replace advice given to you by your health care provider. Make sure you discuss any questions you have with your health care provider.   Document Released: 07/18/2000 Document Revised: 06/08/2014 Document Reviewed: 04/29/2013 Elsevier Interactive Patient Education [employer].

## 2015-03-29 ENCOUNTER — Emergency Department (HOSPITAL_COMMUNITY)
Admission: EM | Admit: 2015-03-29 | Discharge: 2015-03-29 | Disposition: A | Discharge status: Home / Self Care | Payer: BLUE CROSS/BLUE SHIELD | Attending: Emergency Medicine | Admitting: Emergency Medicine

## 2015-03-29 ENCOUNTER — Encounter (HOSPITAL_COMMUNITY): Payer: Self-pay

## 2015-03-29 DIAGNOSIS — F17200 Nicotine dependence, unspecified, uncomplicated: Secondary | ICD-10-CM | POA: Diagnosis not present

## 2015-03-29 DIAGNOSIS — Z86018 Personal history of other benign neoplasm: Secondary | ICD-10-CM | POA: Insufficient documentation

## 2015-03-29 DIAGNOSIS — R1011 Right upper quadrant pain: Secondary | ICD-10-CM | POA: Diagnosis not present

## 2015-03-29 DIAGNOSIS — Z8719 Personal history of other diseases of the digestive system: Secondary | ICD-10-CM | POA: Insufficient documentation

## 2015-03-29 DIAGNOSIS — Z9049 Acquired absence of other specified parts of digestive tract: Secondary | ICD-10-CM | POA: Insufficient documentation

## 2015-03-29 DIAGNOSIS — Z8659 Personal history of other mental and behavioral disorders: Secondary | ICD-10-CM | POA: Insufficient documentation

## 2015-03-29 LAB — COMPREHENSIVE METABOLIC PANEL
ALT: 24 U/L (ref 14–54)
AST: 17 U/L (ref 15–41)
Albumin: 4.4 g/dL (ref 3.5–5.0)
Alkaline Phosphatase: 66 U/L (ref 38–126)
Anion gap: 11 (ref 5–15)
BUN: 12 mg/dL (ref 6–20)
CHLORIDE: 103 mmol/L (ref 101–111)
CO2: 27 mmol/L (ref 22–32)
Calcium: 9.4 mg/dL (ref 8.9–10.3)
Creatinine, Ser: 0.54 mg/dL (ref 0.44–1.00)
Glucose, Bld: 93 mg/dL (ref 65–99)
POTASSIUM: 3.8 mmol/L (ref 3.5–5.1)
Sodium: 141 mmol/L (ref 135–145)
Total Bilirubin: 0.5 mg/dL (ref 0.3–1.2)
Total Protein: 7.1 g/dL (ref 6.5–8.1)

## 2015-03-29 LAB — CBC WITH DIFFERENTIAL/PLATELET
BASOS ABS: 0.1 10*3/uL (ref 0.0–0.1)
Basophils Relative: 1 %
EOS ABS: 0.1 10*3/uL (ref 0.0–0.7)
EOS PCT: 2 %
HCT: 40.8 % (ref 36.0–46.0)
Hemoglobin: 14.1 g/dL (ref 12.0–15.0)
LYMPHS PCT: 34 %
Lymphs Abs: 2.5 10*3/uL (ref 0.7–4.0)
MCH: 31.1 pg (ref 26.0–34.0)
MCHC: 34.6 g/dL (ref 30.0–36.0)
MCV: 90.1 fL (ref 78.0–100.0)
MONO ABS: 0.5 10*3/uL (ref 0.1–1.0)
Monocytes Relative: 6 %
Neutro Abs: 4.3 10*3/uL (ref 1.7–7.7)
Neutrophils Relative %: 57 %
PLATELETS: 234 10*3/uL (ref 150–400)
RBC: 4.53 MIL/uL (ref 3.87–5.11)
RDW: 13.2 % (ref 11.5–15.5)
WBC: 7.5 10*3/uL (ref 4.0–10.5)

## 2015-03-29 LAB — URINE MICROSCOPIC-ADD ON

## 2015-03-29 LAB — URINALYSIS, ROUTINE W REFLEX MICROSCOPIC
Bilirubin Urine: NEGATIVE
Glucose, UA: NEGATIVE mg/dL
Ketones, ur: NEGATIVE mg/dL
LEUKOCYTES UA: NEGATIVE
Nitrite: NEGATIVE
PROTEIN: NEGATIVE mg/dL
Specific Gravity, Urine: >1.03 — ABNORMAL HIGH (ref 1.005–1.030)
pH: 5.5 (ref 5.0–8.0)

## 2015-03-29 LAB — LIPASE, BLOOD: LIPASE: 51 U/L (ref 11–51)

## 2015-03-29 NOTE — ED Notes (Signed)
Pt alert & oriented x4, stable gait. Patient given discharge instructions, paperwork & prescription(s). Patient  instructed to stop at the registration desk to finish any additional paperwork. Patient verbalized understanding. Pt left department w/ no further questions. 

## 2015-03-29 NOTE — ED Notes (Signed)
Pt c/o nausea and RUQ pain x 1 month.  Denies vomiting.

## 2015-03-29 NOTE — ED Provider Notes (Signed)
CSN: [id number]     Arrival date & time 03/29/15  1515 History   First MD Initiated Contact with Patient 03/29/15 1808     Chief Complaint  Patient presents with  . Abdominal Pain     (Consider location/radiation/quality/duration/timing/severity/associated sxs/prior Treatment) Patient is a 50 y.o. female presenting with abdominal pain. The history is provided by the patient. No language interpreter was used.  Abdominal Pain Pain location:  RUQ Pain quality: aching   Pain radiates to:  Does not radiate Pain severity:  Moderate Onset quality:  Gradual Duration:  4 weeks Timing:  Intermittent Progression:  Waxing and waning Chronicity:  New Context: not awakening from sleep and not diet changes   Relieved by:  Nothing Worsened by:  Nothing tried Ineffective treatments:  None tried Associated symptoms: no anorexia and no diarrhea   Risk factors: no alcohol abuse and not pregnant   Pt reports she has right upper abdominal pain after eating.  Pt reports pain comes and goes.  Pt thinks she is having gallbladder problems.  No fever. No vomitting today.  Past Medical History  Diagnosis Date  . Microscopic colitis 04/26/2010    Colonoscopy by Dr. Gala Romney, good response with Entocort  . Erosive esophagitis 04/26/10    EGD by Dr. Gala Romney, small hiatal hernia  . Tubular adenoma of colon 04/26/10    Junction of descending and sigmoid 40 CM from anus  . Depression   . IBS (irritable bowel syndrome)     Diarrhea predominant   Past Surgical History  Procedure Laterality Date  . Appendectomy  2/11    Dr. Romona Curls with a delayed closure  . Tubal ligation    . Tonsillectomy    . Abdominal hysterectomy     Family History  Problem Relation Age of Onset  . Diabetes Mother   . Coronary artery disease Mother    Social History  Substance Use Topics  . Smoking status: Current Every Day Smoker -- 0.50 packs/day  . Smokeless tobacco: None  . Alcohol Use: 0.6 oz/week    1 Glasses of wine per  week     Comment: seldom   OB History    No data available     Review of Systems  Gastrointestinal: Positive for abdominal pain. Negative for diarrhea and anorexia.  All other systems reviewed and are negative.     Allergies  Review of patient's allergies indicates no known allergies.  Home Medications   Prior to Admission medications   Medication Sig Start Date End Date Taking? Authorizing Provider  Aspirin-Acetaminophen-Caffeine (GOODY HEADACHE PO) Take 1 packet by mouth 2 (two) times daily as needed (for pain).    Historical Provider, MD  cephALEXin (KEFLEX) 500 MG capsule Take 1 capsule (500 mg total) by mouth 4 (four) times daily. For 7 days Patient not taking: Reported on 03/08/2015 12/08/14   Tammy Triplett, PA-C  diphenhydrAMINE (BENADRYL) 25 mg capsule Take 25 mg by mouth every 6 (six) hours as needed for itching.     Historical Provider, MD  diphenhydramine-calamine (CALADRYL) 1-8 % LOTN Apply 1 application topically 2 (two) times daily as needed (itching).     Historical Provider, MD  ibuprofen (ADVIL,MOTRIN) 200 MG tablet Take 200 mg by mouth every 6 (six) hours as needed for headache.    Historical Provider, MD  pseudoephedrine-acetaminophen (TYLENOL SINUS) 30-500 MG TABS tablet Take 1 tablet by mouth every 4 (four) hours as needed.    Historical Provider, MD   BP 128/63 mmHg  Pulse  86  Temp(Src) 99.6 F (37.6 C) (Oral)  Resp 18  Ht 5\' 5"  (1.651 m)  Wt 71.215 kg  BMI 26.13 kg/m2  SpO2 100% Physical Exam  Constitutional: She is oriented to person, place, and time. She appears well-developed and well-nourished.  HENT:  Head: Normocephalic and atraumatic.  Right Ear: External ear normal.  Left Ear: External ear normal.  Mouth/Throat: Oropharynx is clear and moist.  Eyes: Conjunctivae and EOM are normal. Pupils are equal, round, and reactive to light.  Neck: Normal range of motion.  Cardiovascular: Normal rate and normal heart sounds.   Pulmonary/Chest: Effort  normal.  Abdominal: Soft. She exhibits no distension.  Musculoskeletal: Normal range of motion.  Neurological: She is alert and oriented to person, place, and time.  Skin: Skin is warm.  Psychiatric: She has a normal mood and affect.  Nursing note and vitals reviewed.   ED Course  Procedures (including critical care time) Labs Review Labs Reviewed  URINALYSIS, ROUTINE W REFLEX MICROSCOPIC (NOT AT [facility]) - Abnormal; Notable for the following:    Specific Gravity, Urine >1.030 (*)    Hgb urine dipstick TRACE (*)    All other components within normal limits  URINE MICROSCOPIC-ADD ON - Abnormal; Notable for the following:    Squamous Epithelial / LPF 0-5 (*)    Bacteria, UA FEW (*)    All other components within normal limits  CBC WITH DIFFERENTIAL/PLATELET  COMPREHENSIVE METABOLIC PANEL  LIPASE, BLOOD    Imaging Review No results found. I have personally reviewed and evaluated these images and lab results as part of my medical decision-making.   EKG Interpretation None      MDM Pt last at ate 3:30.   Pt may have gallbladder disease.  Abdomen is soft, nonacute.  Pt scheduled for am ultrasound.     Final diagnoses:  RUQ pain      Fransico Meadow, PA-C 03/29/15 2059  Ezequiel Essex, MD 03/29/15 [phone]

## 2015-03-29 NOTE — Discharge Instructions (Signed)

## 2015-03-30 ENCOUNTER — Ambulatory Visit (HOSPITAL_COMMUNITY): Admit: 2015-03-30 | Payer: BLUE CROSS/BLUE SHIELD

## 2015-04-07 ENCOUNTER — Ambulatory Visit (HOSPITAL_COMMUNITY)
Admission: RE | Admit: 2015-04-07 | Discharge: 2015-04-07 | Disposition: A | Discharge status: Home / Self Care | Payer: BLUE CROSS/BLUE SHIELD | Source: Ambulatory Visit | Attending: Physician Assistant | Admitting: Physician Assistant

## 2015-04-07 DIAGNOSIS — R1011 Right upper quadrant pain: Secondary | ICD-10-CM | POA: Diagnosis not present

## 2015-04-28 ENCOUNTER — Emergency Department (INDEPENDENT_AMBULATORY_CARE_PROVIDER_SITE_OTHER)
Admission: EM | Admit: 2015-04-28 | Discharge: 2015-04-28 | Disposition: A | Discharge status: Home / Self Care | Payer: BLUE CROSS/BLUE SHIELD | Source: Home / Self Care | Attending: Family Medicine | Admitting: Family Medicine

## 2015-04-28 ENCOUNTER — Encounter (HOSPITAL_COMMUNITY): Payer: Self-pay | Admitting: Emergency Medicine

## 2015-04-28 DIAGNOSIS — J9801 Acute bronchospasm: Secondary | ICD-10-CM | POA: Diagnosis not present

## 2015-04-28 DIAGNOSIS — J069 Acute upper respiratory infection, unspecified: Secondary | ICD-10-CM | POA: Diagnosis not present

## 2015-04-28 DIAGNOSIS — Z720 Tobacco use: Secondary | ICD-10-CM | POA: Diagnosis not present

## 2015-04-28 MED ORDER — ALBUTEROL SULFATE HFA 108 (90 BASE) MCG/ACT IN AERS: 2.0000 | INHALATION_SPRAY | RESPIRATORY_TRACT | Status: DC | PRN

## 2015-04-28 MED ORDER — PREDNISONE 10 MG PO TABS: 20.0000 mg | ORAL_TABLET | Freq: Every day | ORAL | Status: DC

## 2015-04-28 NOTE — Discharge Instructions (Signed)
Bronchospasm, Adult A bronchospasm is a spasm or tightening of the airways going into the lungs. During a bronchospasm breathing becomes more difficult because the airways get smaller. When this happens there can be coughing, a whistling sound when breathing (wheezing), and difficulty breathing. Bronchospasm is often associated with asthma, but not all patients who experience a bronchospasm have asthma. CAUSES  A bronchospasm is caused by inflammation or irritation of the airways. The inflammation or irritation may be triggered by:   Allergies (such as to animals, pollen, food, or mold). Allergens that cause bronchospasm may cause wheezing immediately after exposure or many hours later.   Infection. Viral infections are believed to be the most common cause of bronchospasm.   Exercise.   Irritants (such as pollution, cigarette smoke, strong odors, aerosol sprays, and paint fumes).   Weather changes. Winds increase molds and pollens in the air. Rain refreshes the air by washing irritants out. Cold air may cause inflammation.   Stress and emotional upset.  SIGNS AND SYMPTOMS   Wheezing.   Excessive nighttime coughing.   Frequent or severe coughing with a simple cold.   Chest tightness.   Shortness of breath.  DIAGNOSIS  Bronchospasm is usually diagnosed through a history and physical exam. Tests, such as chest X-rays, are sometimes done to look for other conditions. TREATMENT   Inhaled medicines can be given to open up your airways and help you breathe. The medicines can be given using either an inhaler or a nebulizer machine.  Corticosteroid medicines may be given for severe bronchospasm, usually when it is associated with asthma. HOME CARE INSTRUCTIONS   Always have a plan prepared for seeking medical care. Know when to call your health care provider and local emergency services (911 in the [place] Know where you can access local emergency care.  Only take medicines as  directed by your health care provider.  If you were prescribed an inhaler or nebulizer machine, ask your health care provider to explain how to use it correctly. Always use a spacer with your inhaler if you were given one.  It is necessary to remain calm during an attack. Try to relax and breathe more slowly.  Control your home environment in the following ways:   Change your heating and air conditioning filter at least once a month.   Limit your use of fireplaces and wood stoves.  Do not smoke and do not allow smoking in your home.   Avoid exposure to perfumes and fragrances.   Get rid of pests (such as roaches and mice) and their droppings.   Throw away plants if you see mold on them.   Keep your house clean and dust free.   Replace carpet with wood, tile, or vinyl flooring. Carpet can trap dander and dust.   Use allergy-proof pillows, mattress covers, and box spring covers.   Wash bed sheets and blankets every week in hot water and dry them in a dryer.   Use blankets that are made of polyester or cotton.   Wash hands frequently. SEEK MEDICAL CARE IF:   You have muscle aches.   You have chest pain.   The sputum changes from clear or white to yellow, green, gray, or bloody.   The sputum you cough up gets thicker.   There are problems that may be related to the medicine you are given, such as a rash, itching, swelling, or trouble breathing.  SEEK IMMEDIATE MEDICAL CARE IF:   You have worsening wheezing and coughing  even after taking your prescribed medicines.   You have increased difficulty breathing.   You develop severe chest pain. MAKE SURE YOU:   Understand these instructions.  Will watch your condition.  Will get help right away if you are not doing well or get worse.   This information is not intended to replace advice given to you by your health care provider. Make sure you discuss any questions you have with your health care  provider.   Document Released: 01/25/2003 Document Revised: 02/12/2014 Document Reviewed: 07/14/2012 Elsevier Interactive Patient Education 2016 [employer].  How to Use an Inhaler Using your inhaler correctly is very important. Good technique will make sure that the medicine reaches your lungs.  HOW TO USE AN INHALER:  Take the cap off the inhaler.  If this is the first time using your inhaler, you need to prime it. Shake the inhaler for 5 seconds. Release four puffs into the air, away from your face. Ask your doctor for help if you have questions.  Shake the inhaler for 5 seconds.  Turn the inhaler so the bottle is above the mouthpiece.  Put your pointer finger on top of the bottle. Your thumb holds the bottom of the inhaler.  Open your mouth.  Either hold the inhaler away from your mouth (the width of 2 fingers) or place your lips tightly around the mouthpiece. Ask your doctor which way to use your inhaler.  Breathe out as much air as possible.  Breathe in and push down on the bottle 1 time to release the medicine. You will feel the medicine go in your mouth and throat.  Continue to take a deep breath in very slowly. Try to fill your lungs.  After you have breathed in completely, hold your breath for 10 seconds. This will help the medicine to settle in your lungs. If you cannot hold your breath for 10 seconds, hold it for as long as you can before you breathe out.  Breathe out slowly, through pursed lips. Whistling is an example of pursed lips.  If your doctor has told you to take more than 1 puff, wait at least 15-30 seconds between puffs. This will help you get the best results from your medicine. Do not use the inhaler more than your doctor tells you to.  Put the cap back on the inhaler.  Follow the directions from your doctor or from the inhaler package about cleaning the inhaler. If you use more than one inhaler, ask your doctor which inhalers to use and what order to  use them in. Ask your doctor to help you figure out when you will need to refill your inhaler.  If you use a steroid inhaler, always rinse your mouth with water after your last puff, gargle and spit out the water. Do not swallow the water. GET HELP IF:  The inhaler medicine only partially helps to stop wheezing or shortness of breath.  You are having trouble using your inhaler.  You have some increase in thick spit (phlegm). GET HELP RIGHT AWAY IF:  The inhaler medicine does not help your wheezing or shortness of breath or you have tightness in your chest.  You have dizziness, headaches, or fast heart rate.  You have chills, fever, or night sweats.  You have a large increase of thick spit, or your thick spit is bloody. MAKE SURE YOU:   Understand these instructions.  Will watch your condition.  Will get help right away if you are not  doing well or get worse.   This information is not intended to replace advice given to you by your health care provider. Make sure you discuss any questions you have with your health care provider.   Document Released: 11/01/2007 Document Revised: 11/12/2012 Document Reviewed: 08/21/2012 Elsevier Interactive Patient Education 2016 [employer] Inc[employer]  Upper Respiratory Infection, Adult For nasal and head congestion may take Sudafed PE 10 mg every 4 hours as needed. Saline nasal spray used frequently. For drainage may use Allegra, Claritin or Zyrtec. If you need stronger medicine to stop drainage may take Chlor-Trimeton 2-4 mg every 4 hours. This may cause drowsiness. Ibuprofen 600 mg every 6 hours as needed for pain, discomfort or fever. Drink plenty of fluids and stay well-hydrated.  Most upper respiratory infections (URIs) are a viral infection of the air passages leading to the lungs. A URI affects the nose, throat, and upper air passages. The most common type of URI is nasopharyngitis and is typically referred to as "the common cold." URIs run their  course and usually go away on their own. Most of the time, a URI does not require medical attention, but sometimes a bacterial infection in the upper airways can follow a viral infection. This is called a secondary infection. Sinus and middle ear infections are common types of secondary upper respiratory infections. Bacterial pneumonia can also complicate a URI. A URI can worsen asthma and chronic obstructive pulmonary disease (COPD). Sometimes, these complications can require emergency medical care and may be life threatening.  CAUSES Almost all URIs are caused by viruses. A virus is a type of germ and can spread from one person to another.  RISKS FACTORS You may be at risk for a URI if:   You smoke.   You have chronic heart or lung disease.  You have a weakened defense (immune) system.   You are very young or very old.   You have nasal allergies or asthma.  You work in crowded or poorly ventilated areas.  You work in health care facilities or schools. SIGNS AND SYMPTOMS  Symptoms typically develop 2-3 days after you come in contact with a cold virus. Most viral URIs last 7-10 days. However, viral URIs from the influenza virus (flu virus) can last 14-18 days and are typically more severe. Symptoms may include:   Runny or stuffy (congested) nose.   Sneezing.   Cough.   Sore throat.   Headache.   Fatigue.   Fever.   Loss of appetite.   Pain in your forehead, behind your eyes, and over your cheekbones (sinus pain).  Muscle aches.  DIAGNOSIS  Your health care provider may diagnose a URI by:  Physical exam.  Tests to check that your symptoms are not due to another condition such as:  Strep throat.  Sinusitis.  Pneumonia.  Asthma. TREATMENT  A URI goes away on its own with time. It cannot be cured with medicines, but medicines may be prescribed or recommended to relieve symptoms. Medicines may help:  Reduce your fever.  Reduce your cough.  Relieve  nasal congestion. HOME CARE INSTRUCTIONS   Take medicines only as directed by your health care provider.   Gargle warm saltwater or take cough drops to comfort your throat as directed by your health care provider.  Use a warm mist humidifier or inhale steam from a shower to increase air moisture. This may make it easier to breathe.  Drink enough fluid to keep your urine clear or pale yellow.  Eat soups and other clear broths and maintain good nutrition.   Rest as needed.   Return to work when your temperature has returned to normal or as your health care provider advises. You may need to stay home longer to avoid infecting others. You can also use a face mask and careful hand washing to prevent spread of the virus.  Increase the usage of your inhaler if you have asthma.   Do not use any tobacco products, including cigarettes, chewing tobacco, or electronic cigarettes. If you need help quitting, ask your health care provider. PREVENTION  The best way to protect yourself from getting a cold is to practice good hygiene.   Avoid oral or hand contact with people with cold symptoms.   Wash your hands often if contact occurs.  There is no clear evidence that vitamin C, vitamin E, echinacea, or exercise reduces the chance of developing a cold. However, it is always recommended to get plenty of rest, exercise, and practice good nutrition.  SEEK MEDICAL CARE IF:   You are getting worse rather than better.   Your symptoms are not controlled by medicine.   You have chills.  You have worsening shortness of breath.  You have brown or red mucus.  You have yellow or brown nasal discharge.  You have pain in your face, especially when you bend forward.  You have a fever.  You have swollen neck glands.  You have pain while swallowing.  You have white areas in the back of your throat. SEEK IMMEDIATE MEDICAL CARE IF:   You have severe or persistent:  Headache.  Ear  pain.  Sinus pain.  Chest pain.  You have chronic lung disease and any of the following:  Wheezing.  Prolonged cough.  Coughing up blood.  A change in your usual mucus.  You have a stiff neck.  You have changes in your:  Vision.  Hearing.  Thinking.  Mood. MAKE SURE YOU:   Understand these instructions.  Will watch your condition.  Will get help right away if you are not doing well or get worse.   This information is not intended to replace advice given to you by your health care provider. Make sure you discuss any questions you have with your health care provider.   Document Released: 07/18/2000 Document Revised: 06/08/2014 Document Reviewed: 04/29/2013 Elsevier Interactive Patient Education [employer].

## 2015-04-28 NOTE — ED Provider Notes (Signed)
CSN: [id number]     Arrival date & time 04/28/15  1303 History   First MD Initiated Contact with Patient 04/28/15 1421     Chief Complaint  Patient presents with  . URI   (Consider location/radiation/quality/duration/timing/severity/associated sxs/prior Treatment) HPI Comments: 50 year old female complaining of URI symptoms consisting of body aches, cough, pain to the right ear, nasal congestion, runny nose and this feeling that her eyes are going to burn out of her head. She is a smoker but she states she is smoking few or cigarettes. She was seen in this urgent care almost 2 months ago with very similar symptoms and was given instructions and medication recommendations at that time. She has not followed those instructions for this particular episode of her URI.   Past Medical History  Diagnosis Date  . Microscopic colitis 04/26/2010    Colonoscopy by Dr. Gala Romney, good response with Entocort  . Erosive esophagitis 04/26/10    EGD by Dr. Gala Romney, small hiatal hernia  . Tubular adenoma of colon 04/26/10    Junction of descending and sigmoid 40 CM from anus  . Depression   . IBS (irritable bowel syndrome)     Diarrhea predominant   Past Surgical History  Procedure Laterality Date  . Appendectomy  2/11    Dr. Romona Curls with a delayed closure  . Tubal ligation    . Tonsillectomy    . Abdominal hysterectomy     Family History  Problem Relation Age of Onset  . Diabetes Mother   . Coronary artery disease Mother    Social History  Substance Use Topics  . Smoking status: Current Every Day Smoker -- 0.50 packs/day  . Smokeless tobacco: None  . Alcohol Use: 0.6 oz/week    1 Glasses of wine per week     Comment: seldom   OB History    No data available     Review of Systems  Constitutional: Positive for activity change and fatigue. Negative for fever, chills and appetite change.  HENT: Positive for congestion, ear pain, postnasal drip and rhinorrhea. Negative for ear discharge, facial  swelling and sore throat.   Eyes: Negative.   Respiratory: Positive for cough. Negative for shortness of breath.   Cardiovascular: Negative.  Negative for chest pain.  Gastrointestinal: Negative.   Musculoskeletal: Negative for neck pain and neck stiffness.  Skin: Negative for pallor and rash.  Neurological: Negative.     Allergies  Review of patient's allergies indicates no known allergies.  Home Medications   Prior to Admission medications   Medication Sig Start Date End Date Taking? Authorizing Provider  albuterol (PROVENTIL HFA;VENTOLIN HFA) 108 (90 Base) MCG/ACT inhaler Inhale 2 puffs into the lungs every 4 (four) hours as needed for wheezing or shortness of breath. 04/28/15   Janne Napoleon, NP  Aspirin-Acetaminophen-Caffeine (GOODY HEADACHE PO) Take 1 packet by mouth 2 (two) times daily as needed (for pain).    Historical Provider, MD  diphenhydrAMINE (BENADRYL) 25 mg capsule Take 25 mg by mouth every 6 (six) hours as needed for itching.     Historical Provider, MD  diphenhydramine-calamine (CALADRYL) 1-8 % LOTN Apply 1 application topically 2 (two) times daily as needed (itching).     Historical Provider, MD  ibuprofen (ADVIL,MOTRIN) 200 MG tablet Take 200 mg by mouth every 6 (six) hours as needed for headache.    Historical Provider, MD  predniSONE (DELTASONE) 10 MG tablet Take 2 tablets (20 mg total) by mouth daily. 04/28/15   Janne Napoleon, NP  pseudoephedrine-acetaminophen (TYLENOL SINUS) 30-500 MG TABS tablet Take 1 tablet by mouth every 4 (four) hours as needed.    Historical Provider, MD   Meds Ordered and Administered this Visit  Medications - No data to display  BP 129/83 mmHg  Pulse 80  Temp(Src) 97.8 F (36.6 C) (Oral)  Resp 80  SpO2 96% No data found.   Physical Exam  Constitutional: She is oriented to person, place, and time. She appears well-developed and well-nourished. No distress.  HENT:  Mouth/Throat: No oropharyngeal exudate.  Bilateral TMs with minor  retraction. No erythema or bulging. Oropharynx with minor erythema, clear PND and cobblestoning. No exudates. There is a brown discoloration of the tongue related to smoking.  Eyes: Conjunctivae and EOM are normal.  Neck: Normal range of motion. Neck supple.  Cardiovascular: Normal rate, regular rhythm and normal heart sounds.   Pulmonary/Chest: Effort normal. No respiratory distress. She has wheezes. She has no rales.  Musculoskeletal: Normal range of motion. She exhibits no edema.  Lymphadenopathy:    She has no cervical adenopathy.  Neurological: She is alert and oriented to person, place, and time.  Skin: Skin is warm and dry. No rash noted.  Psychiatric: She has a normal mood and affect.  Nursing note and vitals reviewed.   ED Course  Procedures (including critical care time)  Labs Review Labs Reviewed - No data to display  Imaging Review No results found.   Visual Acuity Review  Right Eye Distance:   Left Eye Distance:   Bilateral Distance:    Right Eye Near:   Left Eye Near:    Bilateral Near:         MDM   1. URI (upper respiratory infection)   2. Cough due to bronchospasm   3. Tobacco abuse disorder     For nasal and head congestion may take Sudafed PE 10 mg every 4 hours as needed. Saline nasal spray used frequently. For drainage may use Allegra, Claritin or Zyrtec. If you need stronger medicine to stop drainage may take Chlor-Trimeton 2-4 mg every 4 hours. This may cause drowsiness. Ibuprofen 600 mg every 6 hours as needed for pain, discomfort or fever. Drink plenty of fluids and stay well-hydrated. Meds ordered this encounter  Medications  . albuterol (PROVENTIL HFA;VENTOLIN HFA) 108 (90 Base) MCG/ACT inhaler    Sig: Inhale 2 puffs into the lungs every 4 (four) hours as needed for wheezing or shortness of breath.    Dispense:  1 Inhaler    Refill:  0    Order Specific Question:  Supervising Provider    Answer:  Ihor Gully D [id number]  . predniSONE  (DELTASONE) 10 MG tablet    Sig: Take 2 tablets (20 mg total) by mouth daily.    Dispense:  12 tablet    Refill:  0    Order Specific Question:  Supervising Provider    Answer:  Billy Fischer [id number]   Stop smoking   Janne Napoleon, NP 04/28/15 1523

## 2015-04-28 NOTE — ED Notes (Signed)
C/o cold sx onset 3/20 associated w/cough, BA, runny nose, congestion, chills.... Denies fevers Taking OTC cold meds w/no relief.  A&O x4... No acute distress.

## 2016-01-11 ENCOUNTER — Encounter: Payer: Self-pay | Admitting: Orthopaedic Surgery

## 2016-01-11 ENCOUNTER — Ambulatory Visit (INDEPENDENT_AMBULATORY_CARE_PROVIDER_SITE_OTHER): Payer: BLUE CROSS/BLUE SHIELD

## 2016-01-11 ENCOUNTER — Ambulatory Visit (INDEPENDENT_AMBULATORY_CARE_PROVIDER_SITE_OTHER): Payer: BLUE CROSS/BLUE SHIELD | Admitting: Orthopaedic Surgery

## 2016-01-11 VITALS — BP 128/79 | HR 85 | Temp 97.7°F | Ht 65.0 in | Wt 168.0 lb

## 2016-01-11 DIAGNOSIS — M79671 Pain in right foot: Secondary | ICD-10-CM

## 2016-01-11 DIAGNOSIS — G8929 Other chronic pain: Secondary | ICD-10-CM

## 2016-01-11 MED ORDER — NAPROXEN 500 MG PO TABS: 500.0000 mg | ORAL_TABLET | Freq: Two times a day (BID) | ORAL | 5 refills | Status: DC

## 2016-01-11 NOTE — Patient Instructions (Signed)
Steps to Quit Smoking Smoking tobacco can be bad for your health. It can also affect almost every organ in your body. Smoking puts you and people around you at risk for many serious Joani Cosma-lasting (chronic) diseases. Quitting smoking is hard, but it is one of the best things that you can do for your health. It is never too late to quit. What are the benefits of quitting smoking? When you quit smoking, you lower your risk for getting serious diseases and conditions. They can include:  Lung cancer or lung disease.  Heart disease.  Stroke.  Heart attack.  Not being able to have children (infertility).  Weak bones (osteoporosis) and broken bones (fractures). If you have coughing, wheezing, and shortness of breath, those symptoms may get better when you quit. You may also get sick less often. If you are pregnant, quitting smoking can help to lower your chances of having a baby of low birth weight. What can I do to help me quit smoking? Talk with your doctor about what can help you quit smoking. Some things you can do (strategies) include:  Quitting smoking totally, instead of slowly cutting back how much you smoke over a period of time.  Going to in-person counseling. You are more likely to quit if you go to many counseling sessions.  Using resources and support systems, such as:  Online chats with a counselor.  Phone quitlines.  Printed self-help materials.  Support groups or group counseling.  Text messaging programs.  Mobile phone apps or applications.  Taking medicines. Some of these medicines may have nicotine in them. If you are pregnant or breastfeeding, do not take any medicines to quit smoking unless your doctor says it is okay. Talk with your doctor about counseling or other things that can help you. Talk with your doctor about using more than one strategy at the same time, such as taking medicines while you are also going to in-person counseling. This can help make quitting  easier. What things can I do to make it easier to quit? Quitting smoking might feel very hard at first, but there is a lot that you can do to make it easier. Take these steps:  Talk to your family and friends. Ask them to support and encourage you.  Call phone quitlines, reach out to support groups, or work with a counselor.  Ask people who smoke to not smoke around you.  Avoid places that make you want (trigger) to smoke, such as:  Bars.  Parties.  Smoke-break areas at work.  Spend time with people who do not smoke.  Lower the stress in your life. Stress can make you want to smoke. Try these things to help your stress:  Getting regular exercise.  Deep-breathing exercises.  Yoga.  Meditating.  Doing a body scan. To do this, close your eyes, focus on one area of your body at a time from head to toe, and notice which parts of your body are tense. Try to relax the muscles in those areas.  Download or buy apps on your mobile phone or tablet that can help you stick to your quit plan. There are many free apps, such as QuitGuide from the CDC (Centers for Disease Control and Prevention). You can find more support from smokefree.gov and other websites. This information is not intended to replace advice given to you by your health care provider. Make sure you discuss any questions you have with your health care provider. Document Released: 11/18/2008 Document Revised: 09/20/2015 Document   Reviewed: 06/08/2014 Elsevier Interactive Patient Education  2017 Elsevier Inc.  

## 2016-01-11 NOTE — Progress Notes (Signed)
Subjective:    Patient ID: Monica Soto, female    DOB: Jan 22, 1966, 50 y.o.   MRN: [id number]  HPI  She has had pain in the right heel and foot for about a year or so.  She has pain when she first gets up in the morning and after sitting a while. She has no trauma. She has seen a podiatrist earlier in the year.  She uses a cold can to rub over her heel at times.  She has tried ibuprofen.  She has not used any shoe inserts.  She has no swelling and no redness.  She is tired of having the pain.    Review of Systems  HENT: Negative for congestion.   Respiratory: Negative for cough and shortness of breath.   Cardiovascular: Positive for leg swelling.  Endocrine: Positive for cold intolerance.  Musculoskeletal: Positive for arthralgias and gait problem.  Allergic/Immunologic: Negative for environmental allergies.   Past Medical History:  Diagnosis Date  . Depression   . Erosive esophagitis 04/26/10   EGD by Dr. Gala Romney, small hiatal hernia  . GERD (gastroesophageal reflux disease)   . History of stomach ulcers   . IBS (irritable bowel syndrome)    Diarrhea predominant  . Microscopic colitis 04/26/2010   Colonoscopy by Dr. Gala Romney, good response with Entocort  . Tubular adenoma of colon 04/26/10   Junction of descending and sigmoid 40 CM from anus    Past Surgical History:  Procedure Laterality Date  . ABDOMINAL HYSTERECTOMY    . APPENDECTOMY  2/11   Dr. Romona Curls with a delayed closure  . TONSILLECTOMY    . TUBAL LIGATION      Current Outpatient Prescriptions on File Prior to Visit  Medication Sig Dispense Refill  . Aspirin-Acetaminophen-Caffeine (GOODY HEADACHE PO) Take 1 packet by mouth 2 (two) times daily as needed (for pain).    Marland Kitchen ibuprofen (ADVIL,MOTRIN) 200 MG tablet Take 200 mg by mouth every 6 (six) hours as needed for headache.    . albuterol (PROVENTIL HFA;VENTOLIN HFA) 108 (90 Base) MCG/ACT inhaler Inhale 2 puffs into the lungs every 4 (four) hours as needed for wheezing  or shortness of breath. (Patient not taking: Reported on 01/11/2016) 1 Inhaler 0  . diphenhydrAMINE (BENADRYL) 25 mg capsule Take 25 mg by mouth every 6 (six) hours as needed for itching.     . diphenhydramine-calamine (CALADRYL) 1-8 % LOTN Apply 1 application topically 2 (two) times daily as needed (itching).     . predniSONE (DELTASONE) 10 MG tablet Take 2 tablets (20 mg total) by mouth daily. (Patient not taking: Reported on 01/11/2016) 12 tablet 0  . pseudoephedrine-acetaminophen (TYLENOL SINUS) 30-500 MG TABS tablet Take 1 tablet by mouth every 4 (four) hours as needed.    . [DISCONTINUED] dexlansoprazole (DEXILANT) 60 MG capsule Take 1 capsule (60 mg total) by mouth daily. (Patient not taking: Reported on 05/03/2014) 30 capsule 11   No current facility-administered medications on file prior to visit.     Social History   Social History  . Marital status: Married    Spouse name: N/A  . Number of children: N/A  . Years of education: N/A   Occupational History  .  [employer]    Third shift   Social History Main Topics  . Smoking status: Current Every Day Smoker    Packs/day: 0.50  . Smokeless tobacco: Never Used  . Alcohol use 0.6 oz/week    1 Glasses of wine per week  Comment: seldom  . Drug use: No  . Sexual activity: Yes    Birth control/ protection: Surgical   Other Topics Concern  . Not on file   Social History Narrative  . No narrative on file    Family History  Problem Relation Age of Onset  . Diabetes Mother   . Coronary artery disease Mother     BP 128/79   Pulse 85   Temp 97.7 F (36.5 C)   Ht 5\' 5"  (1.651 m)   Wt 168 lb (76.2 kg)   BMI 27.96 kg/m      Objective:   Physical Exam  Constitutional: She is oriented to person, place, and time. She appears well-developed and well-nourished.  HENT:  Head: Normocephalic and atraumatic.  Eyes: Conjunctivae and EOM are normal. Pupils are equal, round, and reactive to light.  Neck: Normal range of  motion. Neck supple.  Cardiovascular: Normal rate, regular rhythm and intact distal pulses.   Pulmonary/Chest: Effort normal.  Abdominal: Soft.  Musculoskeletal: She exhibits tenderness (pain in the right plantar heel area and pain with dorsiflexion of ankle, no redness, no swelling, slight limp to the right when first walking, NV intact.  Left foot negative.).  Neurological: She is alert and oriented to person, place, and time. She displays normal reflexes. No cranial nerve deficit. She exhibits normal muscle tone. Coordination normal.  Skin: Skin is warm and dry.  Psychiatric: She has a normal mood and affect. Her behavior is normal. Judgment and thought content normal.   X-rays were taken of the right foot, reported separately.       Assessment & Plan:   Encounter Diagnosis  Name Primary?  . Heel pain, chronic, right Yes   Begin stretching exercises for foot, ankle.  I have gone over the exercises with her.  Begin Naprosyn 500  Precautions discussed.  Call if any problem.  Return in one month.  Electronically Signed Sanjuana Kava, MD 12/6/20179:15 AM

## 2016-02-07 ENCOUNTER — Ambulatory Visit: Payer: BLUE CROSS/BLUE SHIELD | Admitting: Orthopaedic Surgery

## 2016-02-07 ENCOUNTER — Encounter: Payer: Self-pay | Admitting: Orthopaedic Surgery

## 2018-02-10 ENCOUNTER — Ambulatory Visit (HOSPITAL_COMMUNITY)
Admission: EM | Admit: 2018-02-10 | Discharge: 2018-02-10 | Disposition: A | Discharge status: Home / Self Care | Payer: BLUE CROSS/BLUE SHIELD | Attending: Internal Medicine | Admitting: Internal Medicine

## 2018-02-10 ENCOUNTER — Encounter (HOSPITAL_COMMUNITY): Payer: Self-pay | Admitting: Emergency Medicine

## 2018-02-10 DIAGNOSIS — J40 Bronchitis, not specified as acute or chronic: Secondary | ICD-10-CM | POA: Diagnosis not present

## 2018-02-10 MED ORDER — FLUTICASONE PROPIONATE 50 MCG/ACT NA SUSP: 1.0000 | Freq: Every day | NASAL | 2 refills | Status: DC

## 2018-02-10 MED ORDER — AZITHROMYCIN 250 MG PO TABS: 250.0000 mg | ORAL_TABLET | Freq: Every day | ORAL | 0 refills | Status: DC

## 2018-02-10 MED ORDER — PREDNISONE 10 MG PO TABS: 40.0000 mg | ORAL_TABLET | Freq: Every day | ORAL | 0 refills | Status: AC

## 2018-02-10 NOTE — Discharge Instructions (Signed)
We will go ahead and treat you for bronchitis Try to cut down on the smoking I will send some prednisone and z pac to the pharmacy.  Mucinex for cough and congestion.  Follow up as needed for continued or worsening symptoms

## 2018-02-10 NOTE — ED Triage Notes (Signed)
Pt here for URI sx with cough and congestion

## 2018-02-10 NOTE — ED Provider Notes (Signed)
[place]    CSN: [id number] Arrival date & time: 02/10/18  1544     History   Chief Complaint Chief Complaint  Patient presents with  . Cough    appt 1615    HPI Monica Soto is a 53 y.o. female.   Patient is a 53 year old female that presents with worsening cough, congestion for approximately 1 week.  Initially her symptoms started as nasal congestion and rhinorrhea.  Since it has moved into her chest with chest congestion and coughing up mucus.  Patient is a current everyday smoker.  She has tried to cut back on her smoking.  She denies any fever but has had some body aches and chills.  Reports a history of bronchitis that she last had approximately 2 years ago.  She does work in [employer].   She has been using NyQuil for her symptoms.  ROS per HPI      Past Medical History:  Diagnosis Date  . Depression   . Erosive esophagitis 04/26/10   EGD by Dr. Gala Romney, small hiatal hernia  . GERD (gastroesophageal reflux disease)   . History of stomach ulcers   . IBS (irritable bowel syndrome)    Diarrhea predominant  . Microscopic colitis 04/26/2010   Colonoscopy by Dr. Gala Romney, good response with Entocort  . Tubular adenoma of colon 04/26/10   Junction of descending and sigmoid 40 CM from anus    Patient Active Problem List   Diagnosis Date Noted  . Lymphocytic colitis 05/30/2010  . GERD 04/17/2010  . IRRITABLE BOWEL SYNDROME 02/14/2010  . DIARRHEA 02/14/2010  . ABDOMINAL PAIN, MILD 02/14/2010    Past Surgical History:  Procedure Laterality Date  . ABDOMINAL HYSTERECTOMY    . APPENDECTOMY  2/11   Dr. Romona Curls with a delayed closure  . TONSILLECTOMY    . TUBAL LIGATION      OB History   No obstetric history on file.      Home Medications    Prior to Admission medications   Medication Sig Start Date End Date Taking? Authorizing Provider  albuterol (PROVENTIL HFA;VENTOLIN HFA) 108 (90 Base) MCG/ACT inhaler Inhale 2 puffs into the lungs every 4  (four) hours as needed for wheezing or shortness of breath. Patient not taking: Reported on 01/11/2016 04/28/15   Janne Napoleon, NP  Aspirin-Acetaminophen-Caffeine (GOODY HEADACHE PO) Take 1 packet by mouth 2 (two) times daily as needed (for pain).    [provider]  azithromycin (ZITHROMAX) 250 MG tablet Take 1 tablet (250 mg total) by mouth daily. Take first 2 tablets together, then 1 every day until finished. 02/10/18   Loura Halt A, NP  diphenhydrAMINE (BENADRYL) 25 mg capsule Take 25 mg by mouth every 6 (six) hours as needed for itching.     [provider]  diphenhydramine-calamine (CALADRYL) 1-8 % LOTN Apply 1 application topically 2 (two) times daily as needed (itching).     [provider]  fluticasone (FLONASE) 50 MCG/ACT nasal spray Place 1 spray into both nostrils daily. 02/10/18   Loura Halt A, NP  ibuprofen (ADVIL,MOTRIN) 200 MG tablet Take 200 mg by mouth every 6 (six) hours as needed for headache.    [provider]  naproxen (NAPROSYN) 500 MG tablet Take 1 tablet (500 mg total) by mouth 2 (two) times daily with a meal. 01/11/16   Sanjuana Kava, MD  predniSONE (DELTASONE) 10 MG tablet Take 4 tablets (40 mg total) by mouth daily for 5 days. 02/10/18 02/15/18  Rozanna Box,  Ilean Spradlin A, NP  pseudoephedrine-acetaminophen (TYLENOL SINUS) 30-500 MG TABS tablet Take 1 tablet by mouth every 4 (four) hours as needed.    [provider]    Family History Family History  Problem Relation Age of Onset  . Diabetes Mother   . Coronary artery disease Mother     Social History Social History   Tobacco Use  . Smoking status: Current Every Day Smoker    Packs/day: 0.50  . Smokeless tobacco: Never Used  Substance Use Topics  . Alcohol use: Yes    Alcohol/week: 1.0 standard drinks    Types: 1 Glasses of wine per week    Comment: seldom  . Drug use: No     Allergies   Patient has no known allergies.   Review of Systems Review of Systems   Physical  Exam Triage Vital Signs ED Triage Vitals  Enc Vitals Group     BP 02/10/18 1608 128/78     Pulse Rate 02/10/18 1608 78     Resp 02/10/18 1608 16     Temp 02/10/18 1608 98 F (36.7 C)     Temp Source 02/10/18 1608 Oral     SpO2 02/10/18 1608 100 %     Weight --      Height --      Head Circumference --      Peak Flow --      Pain Score 02/10/18 1610 4     Pain Loc --      Pain Edu? --      Excl. in [place]? --    No data found.  Updated Vital Signs BP 128/78 (BP Location: Left Arm)   Pulse 78   Temp 98 F (36.7 C) (Oral)   Resp 16   SpO2 100%   Visual Acuity Right Eye Distance:   Left Eye Distance:   Bilateral Distance:    Right Eye Near:   Left Eye Near:    Bilateral Near:     Physical Exam Vitals signs and nursing note reviewed.  Constitutional:      General: She is not in acute distress.    Appearance: Normal appearance. She is not ill-appearing or toxic-appearing.  HENT:     Head: Normocephalic and atraumatic.     Right Ear: A middle ear effusion is present.     Left Ear: Tympanic membrane, ear canal and external ear normal.     Nose: Congestion and rhinorrhea present.     Mouth/Throat:     Pharynx: Oropharynx is clear. Posterior oropharyngeal erythema present.  Eyes:     Conjunctiva/sclera: Conjunctivae normal.  Neck:     Musculoskeletal: Normal range of motion and neck supple.  Cardiovascular:     Rate and Rhythm: Normal rate and regular rhythm.     Pulses: Normal pulses.     Heart sounds: Normal heart sounds.  Pulmonary:     Effort: Pulmonary effort is normal.     Comments: Coarse lung sounds in all fields.  Musculoskeletal: Normal range of motion.  Lymphadenopathy:     Cervical: No cervical adenopathy.  Skin:    General: Skin is warm and dry.  Neurological:     Mental Status: She is alert.  Psychiatric:        Mood and Affect: Mood normal.      UC Treatments / Results  Labs (all labs ordered are listed, but only abnormal results are  displayed) Labs Reviewed - No data to display  EKG None  Radiology No results found.  Procedures Procedures (including critical care time)  Medications Ordered in UC Medications - No data to display  Initial Impression / Assessment and Plan / UC Course  I have reviewed the triage vital signs and the nursing notes.  Pertinent labs & imaging results that were available during my care of the patient were reviewed by me and considered in my medical decision making (see chart for details).     We will go ahead and treat for bronchitis based on symptoms and exam.  Prednisone daily for 5 days for lung inflammation Mucinex for cough and congestion Flonase nasal spray for nasal congestion Z-Pak sent to the pharmacy in case symptoms do not improve in  the next couple of days with treatment She has an albuterol inhaler to use at home for cough, wheeze, shortness of breath Follow up as needed for continued or worsening symptoms  Final Clinical Impressions(s) / UC Diagnoses   Final diagnoses:  Bronchitis     Discharge Instructions     We will go ahead and treat you for bronchitis Try to cut down on the smoking I will send some prednisone and z pac to the pharmacy.  Mucinex for cough and congestion.  Follow up as needed for continued or worsening symptoms     ED Prescriptions    Medication Sig Dispense Auth. Provider   predniSONE (DELTASONE) 10 MG tablet Take 4 tablets (40 mg total) by mouth daily for 5 days. 20 tablet Diann Bangerter A, NP   azithromycin (ZITHROMAX) 250 MG tablet Take 1 tablet (250 mg total) by mouth daily. Take first 2 tablets together, then 1 every day until finished. 6 tablet Bran Aldridge A, NP   fluticasone (FLONASE) 50 MCG/ACT nasal spray Place 1 spray into both nostrils daily. 16 g Loura Halt A, NP     Controlled Substance Prescriptions [place] Controlled Substance Registry consulted? Not Applicable   Orvan July, NP 02/10/18 1649

## 2018-06-11 ENCOUNTER — Telehealth: Payer: 59 | Admitting: Nurse Practitioner

## 2018-06-11 DIAGNOSIS — J0100 Acute maxillary sinusitis, unspecified: Secondary | ICD-10-CM

## 2018-06-11 DIAGNOSIS — R05 Cough: Secondary | ICD-10-CM

## 2018-06-11 DIAGNOSIS — R110 Nausea: Secondary | ICD-10-CM

## 2018-06-11 DIAGNOSIS — R059 Cough, unspecified: Secondary | ICD-10-CM

## 2018-06-11 MED ORDER — BENZONATATE 100 MG PO CAPS: 100.0000 mg | ORAL_CAPSULE | Freq: Three times a day (TID) | ORAL | 0 refills | Status: DC | PRN

## 2018-06-11 MED ORDER — ONDANSETRON HCL 4 MG PO TABS: 4.0000 mg | ORAL_TABLET | Freq: Three times a day (TID) | ORAL | 0 refills | Status: DC | PRN

## 2018-06-11 MED ORDER — AMOXICILLIN-POT CLAVULANATE 875-125 MG PO TABS: 1.0000 | ORAL_TABLET | Freq: Two times a day (BID) | ORAL | 0 refills | Status: DC

## 2018-06-11 NOTE — Progress Notes (Signed)
We are sorry that you are not feeling well.  Here is how we plan to help!  Based on what you have shared with me it looks like you have sinusitis.  Sinusitis is inflammation and infection in the sinus cavities of the head.  Based on your presentation I believe you most likely have Acute Bacterial Sinusitis.  This is an infection caused by bacteria and is treated with antibiotics. I have prescribed Augmentin 875mg /125mg  one tablet twice daily with food, for 7 days. You may use an oral decongestant such as Mucinex D or if you have glaucoma or high blood pressure use plain Mucinex. Saline nasal spray help and can safely be used as often as needed for congestion.  If you develop worsening sinus pain, fever or notice severe headache and vision changes, or if symptoms are not better after completion of antibiotic, please schedule an appointment with a health care provider.    Sinus infections are not as easily transmitted as other respiratory infection, however we still recommend that you avoid close contact with loved ones, especially the very young and elderly.  Remember to wash your hands thoroughly throughout the day as this is the number one way to prevent the spread of infection!  I have also sent in zofran for nausea you can take 3 times a day as needed. Tessalon perles for cough.   Home Care:  Only take medications as instructed by your medical team.  Complete the entire course of an antibiotic.  Do not take these medications with alcohol.  A steam or ultrasonic humidifier can help congestion.  You can place a towel over your head and breathe in the steam from hot water coming from a faucet.  Avoid close contacts especially the very young and the elderly.  Cover your mouth when you cough or sneeze.  Always remember to wash your hands.  Get Help Right Away If:  You develop worsening fever or sinus pain.  You develop a severe head ache or visual changes.  Your symptoms persist after  you have completed your treatment plan.  Make sure you  Understand these instructions.  Will watch your condition.  Will get help right away if you are not doing well or get worse.  Your e-visit answers were reviewed by a board certified advanced clinical practitioner to complete your personal care plan.  Depending on the condition, your plan could have included both over the counter or prescription medications.  If there is a problem please reply  once you have received a response from your provider.  Your safety is important to [place].  If you have drug allergies check your prescription carefully.    You can use MyChart to ask questions about today's visit, request a non-urgent call back, or ask for a work or school excuse for 24 hours related to this e-Visit. If it has been greater than 24 hours you will need to follow up with your provider, or enter a new e-Visit to address those concerns.  You will get an e-mail in the next two days asking about your experience.  I hope that your e-visit has been valuable and will speed your recovery. Thank you for using e-visits.   5-10 minutes spent reviewing and documenting in chart.

## 2018-09-11 ENCOUNTER — Other Ambulatory Visit: Payer: Self-pay

## 2018-09-11 ENCOUNTER — Ambulatory Visit
Admission: EM | Admit: 2018-09-11 | Discharge: 2018-09-11 | Disposition: A | Discharge status: Home / Self Care | Payer: BC Managed Care – PPO | Attending: Emergency Medicine | Admitting: Emergency Medicine

## 2018-09-11 DIAGNOSIS — T07XXXA Unspecified multiple injuries, initial encounter: Secondary | ICD-10-CM | POA: Diagnosis not present

## 2018-09-11 DIAGNOSIS — L298 Other pruritus: Secondary | ICD-10-CM | POA: Diagnosis not present

## 2018-09-11 DIAGNOSIS — W57XXXA Bitten or stung by nonvenomous insect and other nonvenomous arthropods, initial encounter: Secondary | ICD-10-CM | POA: Diagnosis not present

## 2018-09-11 DIAGNOSIS — R21 Rash and other nonspecific skin eruption: Secondary | ICD-10-CM | POA: Diagnosis not present

## 2018-09-11 DIAGNOSIS — L299 Pruritus, unspecified: Secondary | ICD-10-CM

## 2018-09-11 MED ORDER — PREDNISONE 20 MG PO TABS: 20.0000 mg | ORAL_TABLET | Freq: Two times a day (BID) | ORAL | 0 refills | Status: AC

## 2018-09-11 NOTE — ED Triage Notes (Signed)
Pt states it feels as if she was bitten by bus but doesn't see any and small red areas are all over body , took benadryl this morning

## 2018-09-11 NOTE — ED Provider Notes (Signed)
[place]   [id number] 09/11/18 Arrival Time: 2993  CC: Bug bites, rash, and itching  SUBJECTIVE:  Monica Soto is a 53 y.o. female who presents with a bug bites, rash and itching that began yesterday.  States she felt something bite her RT thigh, and brushed it off.  She did not see what bit her at that time.   Noticed similar rash/ bug bites to torso that day before.  Denies changes in soaps, detergents, close contacts with similar rash, environmental trigger, or allergy. Denies medications change or starting a new medication recently.  Denies recent travel or sleeping in another location.  Does admit to having an indoor/ outdoor cat.  Unsure if the cat has fleas or not.  Rash/ bug bites are diffuse about bilateral thighs, left arm, anterior and posterior torso.  Describes it as red and itchy.  Has tried OTC benadryl with minimal relief.  Symptoms are made worse with scratching.  Reports similar symptoms in the past related to ant bites and poison ivy, that improved with steroid.   Denies fever, chills, nausea, vomiting, swelling, discharge, lip swelling, mouth swelling, throat swelling, dysphagia, dyspnea, SOB, chest pain, abdominal pain, changes in bowel or bladder function.    ROS: As per HPI.  All other pertinent ROS negative.     Past Medical History:  Diagnosis Date  . Depression   . Erosive esophagitis 04/26/10   EGD by Dr. Gala Romney, small hiatal hernia  . GERD (gastroesophageal reflux disease)   . History of stomach ulcers   . IBS (irritable bowel syndrome)    Diarrhea predominant  . Microscopic colitis 04/26/2010   Colonoscopy by Dr. Gala Romney, good response with Entocort  . Tubular adenoma of colon 04/26/10   Junction of descending and sigmoid 40 CM from anus   Past Surgical History:  Procedure Laterality Date  . ABDOMINAL HYSTERECTOMY    . APPENDECTOMY  2/11   Dr. Romona Curls with a delayed closure  . TONSILLECTOMY    . TUBAL LIGATION     No Known Allergies No current  facility-administered medications on file prior to encounter.    Current Outpatient Medications on File Prior to Encounter  Medication Sig Dispense Refill  . Aspirin-Acetaminophen-Caffeine (GOODY HEADACHE PO) Take 1 packet by mouth 2 (two) times daily as needed (for pain).    Marland Kitchen diphenhydrAMINE (BENADRYL) 25 mg capsule Take 25 mg by mouth every 6 (six) hours as needed for itching.     . fluticasone (FLONASE) 50 MCG/ACT nasal spray Place 1 spray into both nostrils daily. 16 g 2  . ibuprofen (ADVIL,MOTRIN) 200 MG tablet Take 200 mg by mouth every 6 (six) hours as needed for headache.    . naproxen (NAPROSYN) 500 MG tablet Take 1 tablet (500 mg total) by mouth 2 (two) times daily with a meal. 60 tablet 5  . pseudoephedrine-acetaminophen (TYLENOL SINUS) 30-500 MG TABS tablet Take 1 tablet by mouth every 4 (four) hours as needed.    . [DISCONTINUED] albuterol (PROVENTIL HFA;VENTOLIN HFA) 108 (90 Base) MCG/ACT inhaler Inhale 2 puffs into the lungs every 4 (four) hours as needed for wheezing or shortness of breath. (Patient not taking: Reported on 01/11/2016) 1 Inhaler 0   Social History   Socioeconomic History  . Marital status: Married    Spouse name: Not on file  . Number of children: Not on file  . Years of education: Not on file  . Highest education level: Not on file  Occupational History  Employer: [employer]    Comment: Third shift  Social Needs  . Financial resource strain: Not on file  . Food insecurity    Worry: Not on file    Inability: Not on file  . Transportation needs    Medical: Not on file    Non-medical: Not on file  Tobacco Use  . Smoking status: Current Every Day Smoker    Packs/day: 0.50  . Smokeless tobacco: Never Used  Substance and Sexual Activity  . Alcohol use: Yes    Alcohol/week: 1.0 standard drinks    Types: 1 Glasses of wine per week    Comment: seldom  . Drug use: No  . Sexual activity: Yes    Birth control/protection: Surgical  Lifestyle  .  Physical activity    Days per week: Not on file    Minutes per session: Not on file  . Stress: Not on file  Relationships  . Social Herbalist on phone: Not on file    Gets together: Not on file    Attends religious service: Not on file    Active member of club or organization: Not on file    Attends meetings of clubs or organizations: Not on file    Relationship status: Not on file  . Intimate partner violence    Fear of current or ex partner: Not on file    Emotionally abused: Not on file    Physically abused: Not on file    Forced sexual activity: Not on file  Other Topics Concern  . Not on file  Social History Narrative  . Not on file   Family History  Problem Relation Age of Onset  . Diabetes Mother   . Coronary artery disease Mother     OBJECTIVE: Vitals:   09/11/18 1652  BP: (!) 161/91  Pulse: 80  Resp: 18  Temp: 98.2 F (36.8 C)  SpO2: 95%    General appearance: alert; no distress Head: NCAT Eyes: EOMI grossly Lungs: clear to auscultation bilaterally Extremities: no edema Skin: warm and dry; erythematous papular/ pustular rash diffuse in sparse distribution about the bilateral thighs, left antecubital region, anterior and posterior torso, with some excoriations, blanches with pressure, no obvious discharge, NTTP; no manifestations in inter digit web spaces Neuro: ambulates without difficulty, no facial asymmetries or slurred speech Psychological: alert and cooperative; normal mood and affect  ASSESSMENT & PLAN:  1. Bug bite, initial encounter   2. Rash and nonspecific skin eruption   3. Itching     Meds ordered this encounter  Medications  . predniSONE (DELTASONE) 20 MG tablet    Sig: Take 1 tablet (20 mg total) by mouth 2 (two) times daily with a meal for 5 days.    Dispense:  10 tablet    Refill:  0    Order Specific Question:   Supervising Provider    Answer:   Raylene Everts [[id number]]    Wash with warm water and mild soap Take  oral steroid as prescribed and to completion Use OTC benadryl at night and zyrtec/ claritin/ or allegra during the day for itching Follow up with PCP if symptoms persists  Return or go to the ED if you have any new or worsening symptoms such as fever, chills, nausea, vomiting, abdominal pain, changes in bowel or bladder function, symptoms do not improve with medication, etc...   Reviewed expectations re: course of current medical issues. Questions answered. Outlined signs and symptoms indicating need  for more acute intervention. Patient verbalized understanding. After Visit Summary given.   Lestine Box, PA-C 09/11/18 1739

## 2018-09-11 NOTE — Discharge Instructions (Addendum)
Wash with warm water and mild soap Take oral steroid as prescribed and to completion Use OTC benadryl at night and zyrtec/ claritin/ or allegra during the day for itching Follow up with PCP if symptoms persists  Return or go to the ED if you have any new or worsening symptoms such as fever, chills, nausea, vomiting, abdominal pain, changes in bowel or bladder function, symptoms do not improve with medication, etc..Marland Kitchen

## 2018-09-24 ENCOUNTER — Other Ambulatory Visit: Payer: Self-pay

## 2018-09-24 ENCOUNTER — Ambulatory Visit
Admission: EM | Admit: 2018-09-24 | Discharge: 2018-09-24 | Disposition: A | Discharge status: Home / Self Care | Payer: BC Managed Care – PPO | Attending: Emergency Medicine | Admitting: Emergency Medicine

## 2018-09-24 DIAGNOSIS — N3001 Acute cystitis with hematuria: Secondary | ICD-10-CM | POA: Diagnosis not present

## 2018-09-24 DIAGNOSIS — R300 Dysuria: Secondary | ICD-10-CM

## 2018-09-24 LAB — POCT URINALYSIS DIP (MANUAL ENTRY)
Bilirubin, UA: NEGATIVE
Glucose, UA: NEGATIVE mg/dL
Ketones, POC UA: NEGATIVE mg/dL
Leukocytes, UA: NEGATIVE
Nitrite, UA: NEGATIVE
Protein Ur, POC: NEGATIVE mg/dL
Spec Grav, UA: 1.025 (ref 1.010–1.025)
Urobilinogen, UA: 0.2 E.U./dL
pH, UA: 6 (ref 5.0–8.0)

## 2018-09-24 MED ORDER — PHENAZOPYRIDINE HCL 200 MG PO TABS: 200.0000 mg | ORAL_TABLET | Freq: Three times a day (TID) | ORAL | 0 refills | Status: DC

## 2018-09-24 MED ORDER — NITROFURANTOIN MONOHYD MACRO 100 MG PO CAPS: 100.0000 mg | ORAL_CAPSULE | Freq: Two times a day (BID) | ORAL | 0 refills | Status: AC

## 2018-09-24 NOTE — ED Provider Notes (Signed)
[facility]   CC: UTI  SUBJECTIVE:  Monica Soto is a 53 y.o. female who complains of urinary frequency, urgency and dysuria x 1 day.  Patient admits to going to the beach and swimming in the ocean recently.  Patient denies recent sexual encounter, excessive caffeine intake.  Complains of lower abdominal pressure.  Has NOT tried OTC medications.  Symptoms are made worse with urination.  Admits to similar symptoms in the past with UTIs.  Complains of mild nausea, and mild flank discomfort.  Denies fever, chills, nausea, vomiting, abdominal pain, flank pain, abnormal vaginal discharge, vaginal bleeding, vaginal odor, vaginal itching, vaginal burning, or hematuria.    LMP: No LMP recorded. Patient has had a hysterectomy. Partial hysterectomy.  Still has ovaries, not sure if cervix is still present.    ROS: As in HPI.  All other pertinent ROS negative.     Past Medical History:  Diagnosis Date  . Depression   . Erosive esophagitis 04/26/10   EGD by Dr. Gala Romney, small hiatal hernia  . GERD (gastroesophageal reflux disease)   . History of stomach ulcers   . IBS (irritable bowel syndrome)    Diarrhea predominant  . Microscopic colitis 04/26/2010   Colonoscopy by Dr. Gala Romney, good response with Entocort  . Tubular adenoma of colon 04/26/10   Junction of descending and sigmoid 40 CM from anus   Past Surgical History:  Procedure Laterality Date  . ABDOMINAL HYSTERECTOMY    . APPENDECTOMY  2/11   Dr. Romona Curls with a delayed closure  . TONSILLECTOMY    . TUBAL LIGATION     No Known Allergies No current facility-administered medications on file prior to encounter.    Current Outpatient Medications on File Prior to Encounter  Medication Sig Dispense Refill  . Aspirin-Acetaminophen-Caffeine (GOODY HEADACHE PO) Take 1 packet by mouth 2 (two) times daily as needed (for pain).    Marland Kitchen diphenhydrAMINE (BENADRYL) 25 mg capsule Take 25 mg by mouth every 6 (six) hours as needed for itching.      . fluticasone (FLONASE) 50 MCG/ACT nasal spray Place 1 spray into both nostrils daily. 16 g 2  . ibuprofen (ADVIL,MOTRIN) 200 MG tablet Take 200 mg by mouth every 6 (six) hours as needed for headache.    . naproxen (NAPROSYN) 500 MG tablet Take 1 tablet (500 mg total) by mouth 2 (two) times daily with a meal. 60 tablet 5  . pseudoephedrine-acetaminophen (TYLENOL SINUS) 30-500 MG TABS tablet Take 1 tablet by mouth every 4 (four) hours as needed.    . [DISCONTINUED] albuterol (PROVENTIL HFA;VENTOLIN HFA) 108 (90 Base) MCG/ACT inhaler Inhale 2 puffs into the lungs every 4 (four) hours as needed for wheezing or shortness of breath. (Patient not taking: Reported on 01/11/2016) 1 Inhaler 0   Social History   Socioeconomic History  . Marital status: Married    Spouse name: Not on file  . Number of children: Not on file  . Years of education: Not on file  . Highest education level: Not on file  Occupational History    Employer: [place] [employer]    Comment: Third shift  Social Needs  . Financial resource strain: Not on file  . Food insecurity    Worry: Not on file    Inability: Not on file  . Transportation needs    Medical: Not on file    Non-medical: Not on file  Tobacco Use  . Smoking status: Current Every Day Smoker    Packs/day:  0.50  . Smokeless tobacco: Never Used  Substance and Sexual Activity  . Alcohol use: Yes    Alcohol/week: 1.0 standard drinks    Types: 1 Glasses of wine per week    Comment: seldom  . Drug use: No  . Sexual activity: Yes    Birth control/protection: Surgical  Lifestyle  . Physical activity    Days per week: Not on file    Minutes per session: Not on file  . Stress: Not on file  Relationships  . Social Herbalist on phone: Not on file    Gets together: Not on file    Attends religious service: Not on file    Active member of club or organization: Not on file    Attends meetings of clubs or organizations: Not on file    Relationship status:  Not on file  . Intimate partner violence    Fear of current or ex partner: Not on file    Emotionally abused: Not on file    Physically abused: Not on file    Forced sexual activity: Not on file  Other Topics Concern  . Not on file  Social History Narrative  . Not on file   Family History  Problem Relation Age of Onset  . Diabetes Mother   . Coronary artery disease Mother     OBJECTIVE:  Vitals:   09/24/18 1651  BP: 135/88  Pulse: 72  Resp: 18  Temp: 98.4 F (36.9 C)  SpO2: 95%   General appearance: Alert in no acute distress; well-appearing  HEENT: NCAT.  Oropharynx clear.  Lungs: clear to auscultation bilaterally without adventitious breath sounds Heart: regular rate and rhythm.  Abdomen: soft; non-distended; no tenderness; bowel sounds present; no guarding or rebound tenderness Back: no CVA tenderness Extremities: no edema; symmetrical with no gross deformities Skin: warm and dry Neurologic: Ambulates from chair to exam table without difficulty Psychological: alert and cooperative; normal mood and affect  Labs Reviewed  POCT URINALYSIS DIP (MANUAL ENTRY) - Abnormal; Notable for the following components:      Result Value   Blood, UA small (*)    All other components within normal limits    ASSESSMENT & PLAN:  1. Acute cystitis with hematuria   2. Dysuria     Meds ordered this encounter  Medications  . nitrofurantoin, macrocrystal-monohydrate, (MACROBID) 100 MG capsule    Sig: Take 1 capsule (100 mg total) by mouth 2 (two) times daily for 5 days.    Dispense:  10 capsule    Refill:  0    Order Specific Question:   Supervising Provider    Answer:   Raylene Everts [[id number]]  . phenazopyridine (PYRIDIUM) 200 MG tablet    Sig: Take 1 tablet (200 mg total) by mouth 3 (three) times daily.    Dispense:  6 tablet    Refill:  0    Order Specific Question:   Supervising Provider    Answer:   Raylene Everts [[id number]]   Urine did show some blood which  could be a sign of infection Urine culture sent.  We will call you with abnormal results.   Push fluids and get plenty of rest.   Take antibiotic as directed and to completion Take pyridium as prescribed and as needed for symptomatic relief Follow up with PCP in 1-2 weeks to have urine rechecked and to ensure your symptoms are improving Return here or go to ER if you have any  new or worsening symptoms such as fever, worsening abdominal pain, nausea/vomiting, flank pain, etc...  Outlined signs and symptoms indicating need for more acute intervention. Patient verbalized understanding. After Visit Summary given.     Lestine Box, PA-C 09/24/18 1715

## 2018-09-24 NOTE — ED Triage Notes (Signed)
Pt began having uti symptoms a few days ago

## 2018-09-24 NOTE — Discharge Instructions (Signed)
Urine did show some blood which could be a sign of infection Urine culture sent.  We will call you with abnormal results.   Push fluids and get plenty of rest.   Take antibiotic as directed and to completion Take pyridium as prescribed and as needed for symptomatic relief Follow up with PCP in 1-2 weeks to have urine rechecked and to ensure your symptoms are improving Return here or go to ER if you have any new or worsening symptoms such as fever, worsening abdominal pain, nausea/vomiting, flank pain, etc..Marland Kitchen

## 2018-12-15 ENCOUNTER — Telehealth: Payer: BC Managed Care – PPO | Admitting: Physician Assistant

## 2018-12-15 DIAGNOSIS — M545 Low back pain, unspecified: Secondary | ICD-10-CM

## 2018-12-15 MED ORDER — CYCLOBENZAPRINE HCL 10 MG PO TABS: 10.0000 mg | ORAL_TABLET | Freq: Three times a day (TID) | ORAL | 0 refills | Status: DC | PRN

## 2018-12-15 NOTE — Progress Notes (Signed)
We are sorry that you are not feeling well.  Here is how we plan to help!  Based on what you have shared with me it looks like you mostly have acute back pain.  Acute back pain is defined as musculoskeletal pain that can resolve in 1-3 weeks with conservative treatment.  I have prescribed  Flexeril 10 mg every eight hours as needed which is a muscle relaxer  Some patients experience stomach irritation or in increased heartburn with anti-inflammatory drugs.  Please keep in mind that muscle relaxer's can cause fatigue and should not be taken while at work or driving.  Back pain is very common.  The pain often gets better over time.  The cause of back pain is usually not dangerous.  Most people can learn to manage their back pain on their own.  Home Care  Stay active.  Start with short walks on flat ground if you can.  Try to walk farther each day.  Do not sit, drive or stand in one place for more than 30 minutes.  Do not stay in bed.  Do not avoid exercise or work.  Activity can help your back heal faster.  Be careful when you bend or lift an object.  Bend at your knees, keep the object close to you, and do not twist.  Sleep on a firm mattress.  Lie on your side, and bend your knees.  If you lie on your back, put a pillow under your knees.  Only take medicines as told by your doctor.  Put ice on the injured area.  Put ice in a plastic bag  Place a towel between your skin and the bag  Leave the ice on for 15-20 minutes, 3-4 times a day for the first 2-3 days. 210 After that, you can switch between ice and heat packs.  Ask your doctor about back exercises or massage.  Avoid feeling anxious or stressed.  Find good ways to deal with stress, such as exercise.  Get Help Right Way If:  Your pain does not go away with rest or medicine.  Your pain does not go away in 1 week.  You have new problems.  You do not feel well.  The pain spreads into your legs.  You cannot control when  you poop (bowel movement) or pee (urinate)  You feel sick to your stomach (nauseous) or throw up (vomit)  You have belly (abdominal) pain.  You feel like you may pass out (faint).  If you develop a fever.  Make Sure you:  Understand these instructions.  Will watch your condition  Will get help right away if you are not doing well or get worse.  Your e-visit answers were reviewed by a board certified advanced clinical practitioner to complete your personal care plan.  Depending on the condition, your plan could have included both over the counter or prescription medications.  If there is a problem please reply  once you have received a response from your provider.  Your safety is important to [place].  If you have drug allergies check your prescription carefully.    You can use MyChart to ask questions about today's visit, request a non-urgent call back, or ask for a work or school excuse for 24 hours related to this e-Visit. If it has been greater than 24 hours you will need to follow up with your provider, or enter a new e-Visit to address those concerns.  You will get an e-mail in the next  two days asking about your experience.  I hope that your e-visit has been valuable and will speed your recovery. Thank you for using e-visits.  Greater than 5 minutes, yet less than 10 minutes of time have been spent researching, coordinating and implementing care for this patient today.

## 2018-12-22 ENCOUNTER — Other Ambulatory Visit: Payer: Self-pay

## 2018-12-22 ENCOUNTER — Ambulatory Visit
Admission: EM | Admit: 2018-12-22 | Discharge: 2018-12-22 | Disposition: A | Discharge status: Home / Self Care | Payer: BC Managed Care – PPO | Attending: Emergency Medicine | Admitting: Emergency Medicine

## 2018-12-22 DIAGNOSIS — M7061 Trochanteric bursitis, right hip: Secondary | ICD-10-CM | POA: Diagnosis not present

## 2018-12-22 MED ORDER — PREDNISONE 10 MG (21) PO TBPK: ORAL_TABLET | Freq: Every day | ORAL | 0 refills | Status: DC

## 2018-12-22 MED ORDER — CYCLOBENZAPRINE HCL 10 MG PO TABS: 10.0000 mg | ORAL_TABLET | Freq: Every day | ORAL | 0 refills | Status: DC

## 2018-12-22 MED ORDER — METHYLPREDNISOLONE SODIUM SUCC 125 MG IJ SOLR
125.0000 mg | Freq: Once | INTRAMUSCULAR | Status: AC
  Administered 2018-12-22: 125 mg via INTRAMUSCULAR

## 2018-12-22 NOTE — ED Provider Notes (Signed)
[place]   [id number] 12/22/18 Arrival Time: [id number]  CC: RT HIP PAIN  SUBJECTIVE: History from: patient. Monica Soto is a 53 y.o. female complains of right hip pain that began 1 week ago.  Denies a precipitating event or specific injury.  Localizes the pain to the RT hip and radiates into RT leg.  Describes the pain as constant and achy, but intermittently sharp in character.  Had an e-visit and treated with flexeril with temporary relief.  Symptoms are made worse with sitting.  Denies similar symptoms in the past.  Denies fever, chills, erythema, ecchymosis, effusion, weakness, numbness and tingling, saddle paresthesias, loss of bowel or bladder function.      ROS: As per HPI.  All other pertinent ROS negative.     Past Medical History:  Diagnosis Date  . Depression   . Erosive esophagitis 04/26/10   EGD by Dr. Gala Romney, small hiatal hernia  . GERD (gastroesophageal reflux disease)   . History of stomach ulcers   . IBS (irritable bowel syndrome)    Diarrhea predominant  . Microscopic colitis 04/26/2010   Colonoscopy by Dr. Gala Romney, good response with Entocort  . Tubular adenoma of colon 04/26/10   Junction of descending and sigmoid 40 CM from anus   Past Surgical History:  Procedure Laterality Date  . ABDOMINAL HYSTERECTOMY    . APPENDECTOMY  2/11   Dr. Romona Curls with a delayed closure  . TONSILLECTOMY    . TUBAL LIGATION     No Known Allergies No current facility-administered medications on file prior to encounter.    Current Outpatient Medications on File Prior to Encounter  Medication Sig Dispense Refill  . Aspirin-Acetaminophen-Caffeine (GOODY HEADACHE PO) Take 1 packet by mouth 2 (two) times daily as needed (for pain).    Marland Kitchen diphenhydrAMINE (BENADRYL) 25 mg capsule Take 25 mg by mouth every 6 (six) hours as needed for itching.     . fluticasone (FLONASE) 50 MCG/ACT nasal spray Place 1 spray into both nostrils daily. 16 g 2  . ibuprofen (ADVIL,MOTRIN) 200 MG tablet  Take 200 mg by mouth every 6 (six) hours as needed for headache.    . naproxen (NAPROSYN) 500 MG tablet Take 1 tablet (500 mg total) by mouth 2 (two) times daily with a meal. 60 tablet 5  . phenazopyridine (PYRIDIUM) 200 MG tablet Take 1 tablet (200 mg total) by mouth 3 (three) times daily. 6 tablet 0  . pseudoephedrine-acetaminophen (TYLENOL SINUS) 30-500 MG TABS tablet Take 1 tablet by mouth every 4 (four) hours as needed.    . [DISCONTINUED] albuterol (PROVENTIL HFA;VENTOLIN HFA) 108 (90 Base) MCG/ACT inhaler Inhale 2 puffs into the lungs every 4 (four) hours as needed for wheezing or shortness of breath. (Patient not taking: Reported on 01/11/2016) 1 Inhaler 0   Social History   Socioeconomic History  . Marital status: Married    Spouse name: Not on file  . Number of children: Not on file  . Years of education: Not on file  . Highest education level: Not on file  Occupational History    Employer: [employer]    Comment: Third shift  Social Needs  . Financial resource strain: Not on file  . Food insecurity    Worry: Not on file    Inability: Not on file  . Transportation needs    Medical: Not on file    Non-medical: Not on file  Tobacco Use  . Smoking status: Current Every Day Smoker  Packs/day: 0.50  . Smokeless tobacco: Never Used  Substance and Sexual Activity  . Alcohol use: Yes    Alcohol/week: 1.0 standard drinks    Types: 1 Glasses of wine per week    Comment: seldom  . Drug use: No  . Sexual activity: Yes    Birth control/protection: Surgical  Lifestyle  . Physical activity    Days per week: Not on file    Minutes per session: Not on file  . Stress: Not on file  Relationships  . Social Herbalist on phone: Not on file    Gets together: Not on file    Attends religious service: Not on file    Active member of club or organization: Not on file    Attends meetings of clubs or organizations: Not on file    Relationship status: Not on file  .  Intimate partner violence    Fear of current or ex partner: Not on file    Emotionally abused: Not on file    Physically abused: Not on file    Forced sexual activity: Not on file  Other Topics Concern  . Not on file  Social History Narrative  . Not on file   Family History  Problem Relation Age of Onset  . Diabetes Mother   . Coronary artery disease Mother     OBJECTIVE:  Vitals:   12/22/18 1742  BP: 127/84  Pulse: 82  Resp: 20  Temp: 98 F (36.7 C)  SpO2: 96%    General appearance: ALERT; in no acute distress.  Head: NCAT Lungs: Normal respiratory effort; CTAB CV: RRR Musculoskeletal: RT hip Inspection: Skin warm, dry, clear and intact without obvious erythema, effusion, or ecchymosis. No limb length discrepancies  Palpation: TTP over lateral RT hip ROM: FROM active and passive Strength: 4+/5 hip flexion, 5/5 knee abduction, 5/5 knee adduction, 5/5 knee flexion, 5/5 knee extension, 5/5 dorsiflexion, 5/5 plantar flexion Skin: warm and dry Neurologic: Ambulates without difficulty; Sensation intact about the lower extremities Psychological: alert and cooperative; normal mood and affect   ASSESSMENT & PLAN:  1. Trochanteric bursitis of right hip     Meds ordered this encounter  Medications  . methylPREDNISolone sodium succinate (SOLU-MEDROL) 125 mg/2 mL injection 125 mg  . cyclobenzaprine (FLEXERIL) 10 MG tablet    Sig: Take 1 tablet (10 mg total) by mouth at bedtime.    Dispense:  10 tablet    Refill:  0    Order Specific Question:   Supervising Provider    Answer:   Raylene Everts [id number]  . predniSONE (STERAPRED UNI-PAK 21 TAB) 10 MG (21) TBPK tablet    Sig: Take by mouth daily. Take 6 tabs by mouth daily  for 2 days, then 5 tabs for 2 days, then 4 tabs for 2 days, then 3 tabs for 2 days, 2 tabs for 2 days, then 1 tab by mouth daily for 2 days    Dispense:  42 tablet    Refill:  0    Order Specific Question:   Supervising Provider    Answer:    Raylene Everts [id number]   Steroid shot given in office Continue conservative management of rest, ice, heat, and gentle stretches/ massage Prednisone dose pak prescribed.  Take as directed and to completion Take cyclobenzaprine at nighttime for symptomatic relief. Avoid driving or operating heavy machinery while using medication. Follow up with PCP or orthopedist if symptoms persist Return or go to the ER  if you have any new or worsening symptoms (fever, chills, chest pain, abdominal pain, changes in bowel or bladder habits, pain radiating into lower legs, etc...)    Reviewed expectations re: course of current medical issues. Questions answered. Outlined signs and symptoms indicating need for more acute intervention. Patient verbalized understanding. After Visit Summary given.    Lestine Box, PA-C 12/22/18 1801

## 2018-12-22 NOTE — ED Triage Notes (Signed)
Pt presents with c/o right hip pain that radiates into leg, pain is worse when sitting. Pain began last week.

## 2018-12-22 NOTE — Discharge Instructions (Signed)
Steroid shot given in office Continue conservative management of rest, ice, heat, and gentle stretches/ massage Prednisone dose pak prescribed.  Take as directed and to completion Take cyclobenzaprine at nighttime for symptomatic relief. Avoid driving or operating heavy machinery while using medication. Follow up with PCP or orthopedist if symptoms persist Return or go to the ER if you have any new or worsening symptoms (fever, chills, chest pain, abdominal pain, changes in bowel or bladder habits, pain radiating into lower legs, etc...)

## 2019-02-14 ENCOUNTER — Ambulatory Visit
Admission: EM | Admit: 2019-02-14 | Discharge: 2019-02-14 | Disposition: A | Discharge status: Home / Self Care | Payer: BC Managed Care – PPO | Attending: Emergency Medicine | Admitting: Emergency Medicine

## 2019-02-14 ENCOUNTER — Other Ambulatory Visit: Payer: Self-pay

## 2019-02-14 DIAGNOSIS — M778 Other enthesopathies, not elsewhere classified: Secondary | ICD-10-CM

## 2019-02-14 MED ORDER — PREDNISONE 20 MG PO TABS: 20.0000 mg | ORAL_TABLET | Freq: Two times a day (BID) | ORAL | 0 refills | Status: AC

## 2019-02-14 NOTE — Discharge Instructions (Signed)
Continue conservative management of rest, ice, heat, and gentle stretches/massage Take prednisone as prescribed and to completion Follow up with PCP if symptoms persist Return or go to the ER if you have any new or worsening symptoms (fever, chills, chest pain, shortness of breath, symptoms do not improve with medication, etc...)

## 2019-02-14 NOTE — ED Provider Notes (Signed)
[place]   [id number] 02/14/19 Arrival Time: [id number]  CC: Right shoulder pain  SUBJECTIVE: History from: patient. Monica Soto is a 54 y.o. female complains of right shoulder pain x 5 days.  Denies a precipitating event or specific injury, but does repetitive activities at work.  Folds blankets and sheet.  Localizes the pain to the front and outside of should.  Describes the pain as intermittent and "spre" in character.  5/10.  Has tried OTC medications without relief.  Symptoms are made worse with laying on right side and with overhead activities.  Denies similar symptoms in the past.  Denies fever, chills, erythema, ecchymosis, effusion, chest pain, SOB, weakness, numbness and tingling.   ROS: As per HPI.  All other pertinent ROS negative.     Past Medical History:  Diagnosis Date  . Depression   . Erosive esophagitis 04/26/10   EGD by Dr. Gala Romney, small hiatal hernia  . GERD (gastroesophageal reflux disease)   . History of stomach ulcers   . IBS (irritable bowel syndrome)    Diarrhea predominant  . Microscopic colitis 04/26/2010   Colonoscopy by Dr. Gala Romney, good response with Entocort  . Tubular adenoma of colon 04/26/10   Junction of descending and sigmoid 40 CM from anus   Past Surgical History:  Procedure Laterality Date  . ABDOMINAL HYSTERECTOMY    . APPENDECTOMY  2/11   Dr. Romona Curls with a delayed closure  . TONSILLECTOMY    . TUBAL LIGATION     No Known Allergies No current facility-administered medications on file prior to encounter.   Current Outpatient Medications on File Prior to Encounter  Medication Sig Dispense Refill  . Aspirin-Acetaminophen-Caffeine (GOODY HEADACHE PO) Take 1 packet by mouth 2 (two) times daily as needed (for pain).    . cyclobenzaprine (FLEXERIL) 10 MG tablet Take 1 tablet (10 mg total) by mouth at bedtime. 10 tablet 0  . diphenhydrAMINE (BENADRYL) 25 mg capsule Take 25 mg by mouth every 6 (six) hours as needed for itching.     .  fluticasone (FLONASE) 50 MCG/ACT nasal spray Place 1 spray into both nostrils daily. 16 g 2  . ibuprofen (ADVIL,MOTRIN) 200 MG tablet Take 200 mg by mouth every 6 (six) hours as needed for headache.    . naproxen (NAPROSYN) 500 MG tablet Take 1 tablet (500 mg total) by mouth 2 (two) times daily with a meal. 60 tablet 5  . phenazopyridine (PYRIDIUM) 200 MG tablet Take 1 tablet (200 mg total) by mouth 3 (three) times daily. 6 tablet 0  . pseudoephedrine-acetaminophen (TYLENOL SINUS) 30-500 MG TABS tablet Take 1 tablet by mouth every 4 (four) hours as needed.    . [DISCONTINUED] albuterol (PROVENTIL HFA;VENTOLIN HFA) 108 (90 Base) MCG/ACT inhaler Inhale 2 puffs into the lungs every 4 (four) hours as needed for wheezing or shortness of breath. (Patient not taking: Reported on 01/11/2016) 1 Inhaler 0   Social History   Socioeconomic History  . Marital status: Married    Spouse name: Not on file  . Number of children: Not on file  . Years of education: Not on file  . Highest education level: Not on file  Occupational History    Employer: [place] [employer]    Comment: Third shift  Tobacco Use  . Smoking status: Current Every Day Smoker    Packs/day: 0.50  . Smokeless tobacco: Never Used  Substance and Sexual Activity  . Alcohol use: Yes    Alcohol/week: 1.0 standard drinks  Types: 1 Glasses of wine per week    Comment: seldom  . Drug use: No  . Sexual activity: Yes    Birth control/protection: Surgical  Other Topics Concern  . Not on file  Social History Narrative  . Not on file   Social Determinants of Health   Financial Resource Strain:   . Difficulty of Paying Living Expenses: Not on file  Food Insecurity:   . Worried About Charity fundraiser in the Last Year: Not on file  . Ran Out of Food in the Last Year: Not on file  Transportation Needs:   . Lack of Transportation (Medical): Not on file  . Lack of Transportation (Non-Medical): Not on file  Physical Activity:   . Days of  Exercise per Week: Not on file  . Minutes of Exercise per Session: Not on file  Stress:   . Feeling of Stress : Not on file  Social Connections:   . Frequency of Communication with Friends and Family: Not on file  . Frequency of Social Gatherings with Friends and Family: Not on file  . Attends Religious Services: Not on file  . Active Member of Clubs or Organizations: Not on file  . Attends Archivist Meetings: Not on file  . Marital Status: Not on file  Intimate Partner Violence:   . Fear of Current or Ex-Partner: Not on file  . Emotionally Abused: Not on file  . Physically Abused: Not on file  . Sexually Abused: Not on file   Family History  Problem Relation Age of Onset  . Diabetes Mother   . Coronary artery disease Mother   . Healthy Father     OBJECTIVE:  Vitals:   02/14/19 1323  BP: 124/80  Pulse: 79  Resp: 16  Temp: 97.9 F (36.6 C)  TempSrc: Oral  SpO2: 93%    General appearance: ALERT; in no acute distress.  Head: NCAT Lungs: Normal respiratory effort; CTAB CV: RRR Musculoskeletal: RT shoulder  Inspection: Skin warm, dry, clear and intact without obvious erythema, effusion, or ecchymosis.  Palpation: TTP over [facility] joint, anterior subacromial region, and lateral deltoi ROM: FROM active and passive Strength: 5/5 shld abduction, 5/5 shld adduction, 5/5 elbow flexion, 5/5 elbow extension, 5/5 grip strength Skin: warm and dry Neurologic: Ambulates without difficulty; Sensation intact about the upper extremities Psychological: alert and cooperative; normal mood and affect  ASSESSMENT & PLAN:  1. Right shoulder tendinitis     Meds ordered this encounter  Medications  . predniSONE (DELTASONE) 20 MG tablet    Sig: Take 1 tablet (20 mg total) by mouth 2 (two) times daily with a meal for 5 days.    Dispense:  10 tablet    Refill:  0    Order Specific Question:   Supervising Provider    Answer:   Raylene Everts [id number]    Continue  conservative management of rest, ice, heat, and gentle stretches/massage Take prednisone as prescribed and to completion Follow up with PCP if symptoms persist Return or go to the ER if you have any new or worsening symptoms (fever, chills, chest pain, shortness of breath, symptoms do not improve with medication, etc...)   Reviewed expectations re: course of current medical issues. Questions answered. Outlined signs and symptoms indicating need for more acute intervention. Patient verbalized understanding. After Visit Summary given.    Lestine Box, PA-C 02/14/19 1658

## 2019-02-14 NOTE — ED Triage Notes (Signed)
Pt presents to UC w/ c/o right shoulder pain that goes down right arm x5 days. Pt states pain is worse at night and when lying on that side. Pt c/o worse pain when moving arm in an upward position. Denies numbness or tingling.

## 2019-05-25 ENCOUNTER — Ambulatory Visit
Admission: EM | Admit: 2019-05-25 | Discharge: 2019-05-25 | Disposition: A | Discharge status: Home / Self Care | Payer: BC Managed Care – PPO | Attending: Emergency Medicine | Admitting: Emergency Medicine

## 2019-05-25 ENCOUNTER — Other Ambulatory Visit: Payer: Self-pay

## 2019-05-25 DIAGNOSIS — J0100 Acute maxillary sinusitis, unspecified: Secondary | ICD-10-CM | POA: Diagnosis not present

## 2019-05-25 MED ORDER — AZITHROMYCIN 250 MG PO TABS: 250.0000 mg | ORAL_TABLET | Freq: Every day | ORAL | 0 refills | Status: DC

## 2019-05-25 MED ORDER — FLUTICASONE PROPIONATE 50 MCG/ACT NA SUSP: 1.0000 | Freq: Every day | NASAL | 0 refills | Status: DC

## 2019-05-25 NOTE — ED Provider Notes (Signed)
[facility]    CSN: [id number] Arrival date & time: 05/25/19  0835      History   Chief Complaint Chief Complaint  Patient presents with  . Nasal Congestion    HPI Monica Soto is a 54 y.o. female.   Who presented to the urgent care with a complaint of worsening sinus pressure and sinus pain for the past 1 week.  Report green mucus discharge.  Denies sick exposure to COVID, flu or strep.  Denies recent travel.  Denies aggravating or alleviating symptoms.  Denies previous COVID infection.   Denies fever, chills, fatigue, nasal congestion, rhinorrhea, sore throat, cough, SOB, wheezing, chest pain, nausea, vomiting, changes in bowel or bladder habits.    The history is provided by the patient. No language interpreter was used.    Past Medical History:  Diagnosis Date  . Depression   . Erosive esophagitis 04/26/10   EGD by Dr. Gala Romney, small hiatal hernia  . GERD (gastroesophageal reflux disease)   . History of stomach ulcers   . IBS (irritable bowel syndrome)    Diarrhea predominant  . Microscopic colitis 04/26/2010   Colonoscopy by Dr. Gala Romney, good response with Entocort  . Tubular adenoma of colon 04/26/10   Junction of descending and sigmoid 40 CM from anus    Patient Active Problem List   Diagnosis Date Noted  . Lymphocytic colitis 05/30/2010  . GERD 04/17/2010  . IRRITABLE BOWEL SYNDROME 02/14/2010  . DIARRHEA 02/14/2010  . ABDOMINAL PAIN, MILD 02/14/2010    Past Surgical History:  Procedure Laterality Date  . ABDOMINAL HYSTERECTOMY    . APPENDECTOMY  2/11   Dr. Romona Curls with a delayed closure  . TONSILLECTOMY    . TUBAL LIGATION      OB History   No obstetric history on file.      Home Medications    Prior to Admission medications   Medication Sig Start Date End Date Taking? Authorizing Provider  Aspirin-Acetaminophen-Caffeine (GOODY HEADACHE PO) Take 1 packet by mouth 2 (two) times daily as needed (for pain).    [provider]   azithromycin (ZITHROMAX) 250 MG tablet Take 1 tablet (250 mg total) by mouth daily. Take first 2 tablets together, then 1 every day until finished. 05/25/19   Kayline Sheer, Darrelyn Hillock, FNP  cyclobenzaprine (FLEXERIL) 10 MG tablet Take 1 tablet (10 mg total) by mouth at bedtime. 12/22/18   Wurst, [place], PA-C  diphenhydrAMINE (BENADRYL) 25 mg capsule Take 25 mg by mouth every 6 (six) hours as needed for itching.     [provider]  fluticasone (FLONASE) 50 MCG/ACT nasal spray Place 1 spray into both nostrils daily for 14 days. 05/25/19 06/08/19  Lilyth Lawyer, Darrelyn Hillock, FNP  ibuprofen (ADVIL,MOTRIN) 200 MG tablet Take 200 mg by mouth every 6 (six) hours as needed for headache.    [provider]  naproxen (NAPROSYN) 500 MG tablet Take 1 tablet (500 mg total) by mouth 2 (two) times daily with a meal. 01/11/16   Sanjuana Kava, MD  phenazopyridine (PYRIDIUM) 200 MG tablet Take 1 tablet (200 mg total) by mouth 3 (three) times daily. 09/24/18   Wurst, [place], PA-C  pseudoephedrine-acetaminophen (TYLENOL SINUS) 30-500 MG TABS tablet Take 1 tablet by mouth every 4 (four) hours as needed.    [provider]  albuterol (PROVENTIL HFA;VENTOLIN HFA) 108 (90 Base) MCG/ACT inhaler Inhale 2 puffs into the lungs every 4 (four) hours as needed for wheezing or shortness of breath. Patient not taking: Reported  on 01/11/2016 04/28/15 09/11/18  Janne Napoleon, NP    Family History Family History  Problem Relation Age of Onset  . Diabetes Mother   . Coronary artery disease Mother   . Healthy Father     Social History Social History   Tobacco Use  . Smoking status: Current Every Day Smoker    Packs/day: 0.50  . Smokeless tobacco: Never Used  Substance Use Topics  . Alcohol use: Yes    Alcohol/week: 1.0 standard drinks    Types: 1 Glasses of wine per week    Comment: seldom  . Drug use: No     Allergies   Patient has no known allergies.   Review of Systems Review of Systems   Constitutional: Negative.   HENT: Positive for sinus pressure and sinus pain.   Respiratory: Negative.   Cardiovascular: Negative.   Gastrointestinal: Negative.   Neurological: Negative.   All other systems reviewed and are negative.    Physical Exam Triage Vital Signs ED Triage Vitals  Enc Vitals Group     BP 05/25/19 0849 126/78     Pulse Rate 05/25/19 0849 77     Resp 05/25/19 0849 16     Temp 05/25/19 0849 98 F (36.7 C)     Temp Source 05/25/19 0849 Oral     SpO2 05/25/19 0849 97 %     Weight --      Height --      Head Circumference --      Peak Flow --      Pain Score 05/25/19 0853 0     Pain Loc --      Pain Edu? --      Excl. in [place]? --    No data found.  Updated Vital Signs BP 126/78 (BP Location: Right Arm)   Pulse 77   Temp 98 F (36.7 C) (Oral)   Resp 16   SpO2 97%   Visual Acuity Right Eye Distance:   Left Eye Distance:   Bilateral Distance:    Right Eye Near:   Left Eye Near:    Bilateral Near:     Physical Exam Vitals and nursing note reviewed.  Constitutional:      General: She is not in acute distress.    Appearance: Normal appearance. She is normal weight. She is not ill-appearing or toxic-appearing.  HENT:     Head: Normocephalic.     Right Ear: Ear canal and external ear normal. A middle ear effusion is present. There is no impacted cerumen.     Left Ear: Ear canal and external ear normal. A middle ear effusion is present. There is no impacted cerumen.     Nose: No congestion.     Right Sinus: Maxillary sinus tenderness present.     Left Sinus: Maxillary sinus tenderness present.     Mouth/Throat:     Mouth: Mucous membranes are moist.     Pharynx: Oropharynx is clear. No oropharyngeal exudate or posterior oropharyngeal erythema.  Cardiovascular:     Rate and Rhythm: Normal rate and regular rhythm.     Pulses: Normal pulses.     Heart sounds: Normal heart sounds. No murmur.  Pulmonary:     Effort: Pulmonary effort is normal. No  respiratory distress.     Breath sounds: Normal breath sounds. No wheezing or rhonchi.  Chest:     Chest wall: No tenderness.  Abdominal:     General: Abdomen is flat.  Neurological:  Mental Status: She is alert and oriented to person, place, and time.      UC Treatments / Results  Labs (all labs ordered are listed, but only abnormal results are displayed) Labs Reviewed - No data to display  EKG   Radiology No results found.  Procedures Procedures (including critical care time)  Medications Ordered in UC Medications - No data to display  Initial Impression / Assessment and Plan / UC Course  I have reviewed the triage vital signs and the nursing notes.  Pertinent labs & imaging results that were available during my care of the patient were reviewed by me and considered in my medical decision making (see chart for details).    Patient is stable for discharge.  Azithromycin and Flonase were prescribed.  Was advised to follow PCP to return or go to ED for worsening symptoms.  Final Clinical Impressions(s) / UC Diagnoses   Final diagnoses:  Acute non-recurrent maxillary sinusitis     Discharge Instructions     Rest and push fluids Flonase prescribed.  Use daily for symptomatic relief Azithromycin prescribed.  Take as directed and to completion Continue with OTC ibuprofen/tylenol as needed for pain Follow up with PCP or [facility] if symptoms persists Return or go to the ED if you have any new or worsening symptoms such as fever, chills, worsening sinus pain/pressure, cough, sore throat, chest pain, shortness of breath, abdominal pain, changes in bowel or bladder habits, etc...     ED Prescriptions    Medication Sig Dispense Auth. Provider   azithromycin (ZITHROMAX) 250 MG tablet Take 1 tablet (250 mg total) by mouth daily. Take first 2 tablets together, then 1 every day until finished. 6 tablet Molli Gethers S, FNP   fluticasone (FLONASE) 50 MCG/ACT  nasal spray Place 1 spray into both nostrils daily for 14 days. 16 g Emerson Monte, FNP     PDMP not reviewed this encounter.   Emerson Monte, [place] 05/25/19 [phone]

## 2019-05-25 NOTE — ED Triage Notes (Signed)
Pt presents with c/o nasal  congestion for past week

## 2019-05-25 NOTE — Discharge Instructions (Signed)
Rest and push fluids Flonase prescribed.  Use daily for symptomatic relief Azithromycin prescribed.  Take as directed and to completion Continue with OTC ibuprofen/tylenol as needed for pain Follow up with PCP or [facility] if symptoms persists Return or go to the ED if you have any new or worsening symptoms such as fever, chills, worsening sinus pain/pressure, cough, sore throat, chest pain, shortness of breath, abdominal pain, changes in bowel or bladder habits, etc..Marland Kitchen

## 2019-08-13 ENCOUNTER — Telehealth: Payer: 59 | Admitting: Emergency Medicine

## 2019-08-13 DIAGNOSIS — M549 Dorsalgia, unspecified: Secondary | ICD-10-CM

## 2019-08-13 DIAGNOSIS — M62838 Other muscle spasm: Secondary | ICD-10-CM | POA: Diagnosis not present

## 2019-08-13 MED ORDER — IBUPROFEN 800 MG PO TABS: 800.0000 mg | ORAL_TABLET | Freq: Three times a day (TID) | ORAL | 0 refills | Status: DC

## 2019-08-13 MED ORDER — CYCLOBENZAPRINE HCL 10 MG PO TABS: 10.0000 mg | ORAL_TABLET | Freq: Three times a day (TID) | ORAL | 0 refills | Status: DC | PRN

## 2019-08-13 NOTE — Progress Notes (Signed)

## 2019-10-13 ENCOUNTER — Telehealth: Payer: 59 | Admitting: Physician Assistant

## 2019-10-13 DIAGNOSIS — J069 Acute upper respiratory infection, unspecified: Secondary | ICD-10-CM

## 2019-10-13 MED ORDER — PSEUDOEPH-BROMPHEN-DM 30-2-10 MG/5ML PO SYRP: 5.0000 mL | ORAL_SOLUTION | Freq: Four times a day (QID) | ORAL | 0 refills | Status: AC | PRN

## 2019-10-13 NOTE — Progress Notes (Signed)
We are sorry you are not feeling well.  Here is how we plan to help!  Based on what you have shared with me, it looks like you may have a viral upper respiratory infection.  Upper respiratory infections are caused by a large number of viruses; however, rhinovirus is the most common cause.   Symptoms vary from person to person, with common symptoms including sore throat, cough, fatigue or lack of energy and feeling of general discomfort.  A low-grade fever of up to 100.4 may present, but is often uncommon.  Symptoms vary however, and are closely related to a person's age or underlying illnesses.  The most common symptoms associated with an upper respiratory infection are nasal discharge or congestion, cough, sneezing, headache and pressure in the ears and face.  These symptoms usually persist for about 3 to 10 days, but can last up to 2 weeks.  It is important to know that upper respiratory infections do not cause serious illness or complications in most cases.    Upper respiratory infections can be transmitted from person to person, with the most common method of transmission being a person's hands.  The virus is able to live on the skin and can infect other persons for up to 2 hours after direct contact.  Also, these can be transmitted when someone coughs or sneezes; thus, it is important to cover the mouth to reduce this risk.  To keep the spread of the illness at [place], good hand hygiene is very important.  This is an infection that is most likely caused by a virus. There are no specific treatments other than to help you with the symptoms until the infection runs its course.  We are sorry you are not feeling well.  Here is how we plan to help!   For nasal congestion, you may use an oral decongestants such as Mucinex D or if you have glaucoma or high blood pressure use plain Mucinex.  Saline nasal spray or nasal drops can help and can safely be used as often as needed for congestion.    If you do not  have a history of heart disease, hypertension, diabetes or thyroid disease, prostate/bladder issues or glaucoma, you may also use Sudafed to treat nasal congestion.  It is highly recommended that you consult with a pharmacist or your primary care physician to ensure this medication is safe for you to take.     If you have a cough, you may use cough suppressants such as Delsym and Robitussin.  If you have glaucoma or high blood pressure, you can also use Coricidin HBP.   For symptoms I have prescribed for you   Bromfed syrup 4 times a day for 6 days    If you have a sore or scratchy throat, use a saltwater gargle-  to  teaspoon of salt dissolved in a 4-ounce to 8-ounce glass of warm water.  Gargle the solution for approximately 15-30 seconds and then spit.  It is important not to swallow the solution.  You can also use throat lozenges/cough drops and Chloraseptic spray to help with throat pain or discomfort.  Warm or cold liquids can also be helpful in relieving throat pain.  For headache, pain or general discomfort, you can use Ibuprofen or Tylenol as directed.   Some authorities believe that zinc sprays or the use of Echinacea may shorten the course of your symptoms.   HOME CARE . Only take medications as instructed by your medical team. . Be  sure to drink plenty of fluids. Water is fine as well as fruit juices, sodas and electrolyte beverages. You may want to stay away from caffeine or alcohol. If you are nauseated, try taking small sips of liquids. How do you know if you are getting enough fluid? Your urine should be a pale yellow or almost colorless. . Get rest. . Taking a steamy shower or using a humidifier may help nasal congestion and ease sore throat pain. You can place a towel over your head and breathe in the steam from hot water coming from a faucet. . Using a saline nasal spray works much the same way. . Cough drops, hard candies and sore throat lozenges may ease your  cough. . Avoid close contacts especially the very young and the elderly . Cover your mouth if you cough or sneeze . Always remember to wash your hands.   GET HELP RIGHT AWAY IF: . You develop worsening fever. . If your symptoms do not improve within 10 days . You develop yellow or green discharge from your nose over 3 days. . You have coughing fits . You develop a severe head ache or visual changes. . You develop shortness of breath, difficulty breathing or start having chest pain . Your symptoms persist after you have completed your treatment plan  MAKE SURE YOU   Understand these instructions.  Will watch your condition.  Will get help right away if you are not doing well or get worse.  Your e-visit answers were reviewed by a board certified advanced clinical practitioner to complete your personal care plan. Depending upon the condition, your plan could have included both over the counter or prescription medications. Please review your pharmacy choice. If there is a problem, you may call our nursing hot line at and have the prescription routed to another pharmacy. Your safety is important to [place]. If you have drug allergies check your prescription carefully.   You can use MyChart to ask questions about today's visit, request a non-urgent call back, or ask for a work or school excuse for 24 hours related to this e-Visit. If it has been greater than 24 hours you will need to follow up with your provider, or enter a new e-Visit to address those concerns. You will get an e-mail in the next two days asking about your experience.  I hope that your e-visit has been valuable and will speed your recovery. Thank you for using e-visits.     5 minutes on chart

## 2019-10-31 ENCOUNTER — Ambulatory Visit
Admission: EM | Admit: 2019-10-31 | Discharge: 2019-10-31 | Disposition: A | Discharge status: Home / Self Care | Payer: 59 | Attending: Emergency Medicine | Admitting: Emergency Medicine

## 2019-10-31 ENCOUNTER — Encounter: Payer: Self-pay | Admitting: Emergency Medicine

## 2019-10-31 ENCOUNTER — Ambulatory Visit: Payer: Self-pay

## 2019-10-31 ENCOUNTER — Other Ambulatory Visit: Payer: Self-pay

## 2019-10-31 ENCOUNTER — Telehealth: Payer: Self-pay | Admitting: Emergency Medicine

## 2019-10-31 DIAGNOSIS — R300 Dysuria: Secondary | ICD-10-CM | POA: Insufficient documentation

## 2019-10-31 DIAGNOSIS — R350 Frequency of micturition: Secondary | ICD-10-CM | POA: Diagnosis present

## 2019-10-31 DIAGNOSIS — R112 Nausea with vomiting, unspecified: Secondary | ICD-10-CM | POA: Insufficient documentation

## 2019-10-31 LAB — POCT URINALYSIS DIP (MANUAL ENTRY)
Bilirubin, UA: NEGATIVE
Glucose, UA: NEGATIVE mg/dL
Ketones, POC UA: NEGATIVE mg/dL
Leukocytes, UA: NEGATIVE
Nitrite, UA: NEGATIVE
Protein Ur, POC: NEGATIVE mg/dL
Spec Grav, UA: 1.015 (ref 1.010–1.025)
Urobilinogen, UA: 0.2 E.U./dL
pH, UA: 5.5 (ref 5.0–8.0)

## 2019-10-31 MED ORDER — PHENAZOPYRIDINE HCL 100 MG PO TABS: 100.0000 mg | ORAL_TABLET | Freq: Three times a day (TID) | ORAL | 0 refills | Status: DC | PRN

## 2019-10-31 MED ORDER — ONDANSETRON 4 MG PO TBDP: 4.0000 mg | ORAL_TABLET | Freq: Three times a day (TID) | ORAL | 0 refills | Status: DC | PRN

## 2019-10-31 NOTE — ED Provider Notes (Signed)
[facility]   CC: Burning with urination  SUBJECTIVE:  Monica Soto is a 54 y.o. female who presents to the urgent care for complaint of  Nausea, dysuria, urinary frequency and back pain for the past 4 days.  Patient denies a precipitating event, recent sexual encounter, excessive caffeine intake.  Localizes the pain to the lower back.  Pain is intermittent and described as achy.  Has tried OTC medications without relief.  Symptoms are made worse with urination.  Admits to similar symptoms in the past.  Denies fever, chills, nausea, vomiting, abdominal pain, flank pain, abnormal vaginal discharge or bleeding, hematuria.    LMP: No LMP recorded. Patient has had a hysterectomy.  ROS: As in HPI.  All other pertinent ROS negative.     Past Medical History:  Diagnosis Date  . Depression   . Erosive esophagitis 04/26/10   EGD by Dr. Gala Romney, small hiatal hernia  . GERD (gastroesophageal reflux disease)   . History of stomach ulcers   . IBS (irritable bowel syndrome)    Diarrhea predominant  . Microscopic colitis 04/26/2010   Colonoscopy by Dr. Gala Romney, good response with Entocort  . Tubular adenoma of colon 04/26/10   Junction of descending and sigmoid 40 CM from anus   Past Surgical History:  Procedure Laterality Date  . ABDOMINAL HYSTERECTOMY    . APPENDECTOMY  2/11   Dr. Romona Curls with a delayed closure  . TONSILLECTOMY    . TUBAL LIGATION     No Known Allergies No current facility-administered medications on file prior to encounter.   Current Outpatient Medications on File Prior to Encounter  Medication Sig Dispense Refill  . Aspirin-Acetaminophen-Caffeine (GOODY HEADACHE PO) Take 1 packet by mouth 2 (two) times daily as needed (for pain).    Marland Kitchen azithromycin (ZITHROMAX) 250 MG tablet Take 1 tablet (250 mg total) by mouth daily. Take first 2 tablets together, then 1 every day until finished. 6 tablet 0  . cyclobenzaprine (FLEXERIL) 10 MG tablet Take 1 tablet (10 mg total)  by mouth 3 (three) times daily as needed for muscle spasms. 10 tablet 0  . diphenhydrAMINE (BENADRYL) 25 mg capsule Take 25 mg by mouth every 6 (six) hours as needed for itching.     . fluticasone (FLONASE) 50 MCG/ACT nasal spray Place 1 spray into both nostrils daily for 14 days. 16 g 0  . ibuprofen (ADVIL) 800 MG tablet Take 1 tablet (800 mg total) by mouth 3 (three) times daily. 21 tablet 0  . naproxen (NAPROSYN) 500 MG tablet Take 1 tablet (500 mg total) by mouth 2 (two) times daily with a meal. 60 tablet 5  . [DISCONTINUED] albuterol (PROVENTIL HFA;VENTOLIN HFA) 108 (90 Base) MCG/ACT inhaler Inhale 2 puffs into the lungs every 4 (four) hours as needed for wheezing or shortness of breath. (Patient not taking: Reported on 01/11/2016) 1 Inhaler 0   Social History   Socioeconomic History  . Marital status: Married    Spouse name: Not on file  . Number of children: Not on file  . Years of education: Not on file  . Highest education level: Not on file  Occupational History    Employer: [employer]    Comment: Third shift  Tobacco Use  . Smoking status: Current Every Day Smoker    Packs/day: 0.50  . Smokeless tobacco: Never Used  Substance and Sexual Activity  . Alcohol use: Yes    Alcohol/week: 1.0 standard drink    Types: 1 Glasses  of wine per week    Comment: seldom  . Drug use: No  . Sexual activity: Yes    Birth control/protection: Surgical  Other Topics Concern  . Not on file  Social History Narrative  . Not on file   Social Determinants of Health   Financial Resource Strain:   . Difficulty of Paying Living Expenses: Not on file  Food Insecurity:   . Worried About Charity fundraiser in the Last Year: Not on file  . Ran Out of Food in the Last Year: Not on file  Transportation Needs:   . Lack of Transportation (Medical): Not on file  . Lack of Transportation (Non-Medical): Not on file  Physical Activity:   . Days of Exercise per Week: Not on file  . Minutes of  Exercise per Session: Not on file  Stress:   . Feeling of Stress : Not on file  Social Connections:   . Frequency of Communication with Friends and Family: Not on file  . Frequency of Social Gatherings with Friends and Family: Not on file  . Attends Religious Services: Not on file  . Active Member of Clubs or Organizations: Not on file  . Attends Archivist Meetings: Not on file  . Marital Status: Not on file  Intimate Partner Violence:   . Fear of Current or Ex-Partner: Not on file  . Emotionally Abused: Not on file  . Physically Abused: Not on file  . Sexually Abused: Not on file   Family History  Problem Relation Age of Onset  . Diabetes Mother   . Coronary artery disease Mother   . Healthy Father     OBJECTIVE:  Vitals:   10/31/19 1027 10/31/19 1029  BP: 132/88   Pulse: 72   Resp: 18   Temp: 98.1 F (36.7 C)   TempSrc: Oral   SpO2: 96%   Weight:  150 lb (68 kg)  Height:  5\' 5"  (1.651 m)   General appearance: AOx3 in no acute distress HEENT: NCAT.  Oropharynx clear.  Lungs: clear to auscultation bilaterally without adventitious breath sounds Heart: regular rate and rhythm.  Radial pulses 2+ symmetrical bilaterally Abdomen: soft; non-distended; no tenderness; bowel sounds present; no guarding or rebound tenderness Back: no CVA tenderness Extremities: no edema; symmetrical with no gross deformities Skin: warm and dry Neurologic: Ambulates from chair to exam table without difficulty Psychological: alert and cooperative; normal mood and affect  Labs Reviewed  POCT URINALYSIS DIP (MANUAL ENTRY) - Abnormal; Notable for the following components:      Result Value   Blood, UA small (*)    All other components within normal limits  URINE CULTURE    ASSESSMENT & PLAN:  1. Dysuria   2. Urine frequency   3. Non-intractable vomiting with nausea, unspecified vomiting type     Meds ordered this encounter  Medications  . phenazopyridine (PYRIDIUM) 100 MG  tablet    Sig: Take 1 tablet (100 mg total) by mouth 3 (three) times daily as needed for pain.    Dispense:  10 tablet    Refill:  0  . ondansetron (ZOFRAN ODT) 4 MG disintegrating tablet    Sig: Take 1 tablet (4 mg total) by mouth every 8 (eight) hours as needed for nausea or vomiting.    Dispense:  20 tablet    Refill:  0   Discharge instructions  Urine culture sent.  We will call you with the results.   Push fluids and get  plenty of rest.   Take pyridium as prescribed and as needed for symptomatic relief Follow up with PCP if symptoms persists Return here or go to ER if you have any new or worsening symptoms such as fever, worsening abdominal pain, nausea/vomiting, flank pain, etc...  Outlined signs and symptoms indicating need for more acute intervention. Patient verbalized understanding. After Visit Summary given.     Emerson Monte, FNP 10/31/19 1051

## 2019-10-31 NOTE — Discharge Instructions (Addendum)
Urine culture sent.  We will call you with the results.   Push fluids and get plenty of rest.   Take pyridium as prescribed and as needed for symptomatic relief Follow up with PCP if symptoms persists Return here or go to ER if you have any new or worsening symptoms such as fever, worsening abdominal pain, nausea/vomiting, flank pain, etc..Marland Kitchen

## 2019-10-31 NOTE — Telephone Encounter (Signed)
Patient called stating medication was not available at the pharmacy.  Rx was resent to pharmacy on file

## 2019-10-31 NOTE — ED Triage Notes (Signed)
Burning with urination, urinary frequency, nausea and low back pain  x 4 days

## 2019-11-01 LAB — URINE CULTURE
Culture: NO GROWTH
Special Requests: NORMAL

## 2019-11-22 ENCOUNTER — Ambulatory Visit
Admission: EM | Admit: 2019-11-22 | Discharge: 2019-11-22 | Disposition: A | Discharge status: Home / Self Care | Payer: 59 | Attending: Emergency Medicine | Admitting: Emergency Medicine

## 2019-11-22 ENCOUNTER — Other Ambulatory Visit: Payer: Self-pay

## 2019-11-22 DIAGNOSIS — J019 Acute sinusitis, unspecified: Secondary | ICD-10-CM

## 2019-11-22 DIAGNOSIS — Z1152 Encounter for screening for COVID-19: Secondary | ICD-10-CM

## 2019-11-22 DIAGNOSIS — R059 Cough, unspecified: Secondary | ICD-10-CM

## 2019-11-22 MED ORDER — PREDNISONE 20 MG PO TABS: 20.0000 mg | ORAL_TABLET | Freq: Two times a day (BID) | ORAL | 0 refills | Status: AC

## 2019-11-22 MED ORDER — AMOXICILLIN-POT CLAVULANATE 875-125 MG PO TABS: 1.0000 | ORAL_TABLET | Freq: Two times a day (BID) | ORAL | 0 refills | Status: AC

## 2019-11-22 NOTE — Discharge Instructions (Signed)
COVID testing ordered.  It will take between 5-7 days for test results.  Someone will contact you regarding abnormal results.    In the meantime: You should remain isolated in your home for 10 days from symptom onset AND greater than 72 hours after symptoms resolution (absence of fever without the use of fever-reducing medication and improvement in respiratory symptoms), whichever is longer Get plenty of rest and push fluids Augmentin prescribed.  Take as directed and to completion Prednisone for congestion Use OTC zyrtec for nasal congestion, runny nose, and/or sore throat Use OTC flonase for nasal congestion and runny nose Use medications daily for symptom relief Use OTC medications like ibuprofen or tylenol as needed fever or pain Call or go to the ED if you have any new or worsening symptoms such as fever, worsening cough, shortness of breath, chest tightness, chest pain, turning blue, changes in mental status, etc..Marland Kitchen

## 2019-11-22 NOTE — ED Triage Notes (Signed)
Pt presents with c/o nasal congestion and cough, pt states feels like bronchitis

## 2019-11-22 NOTE — ED Provider Notes (Signed)
[facility]   [id number] 11/22/19 Arrival Time: 5732   CC: COVID symptoms  SUBJECTIVE: History from: patient.  Monica Soto is a 54 y.o. female who presents with nasal congestion, sinus pain/ pressure, PND, and cough x 1 week.  Denies sick exposure to COVID, flu or strep.  Has tried OTC medications without relief.  Denies aggravating factors.  Reports previous symptoms in the past with sinus infection and bronchitis.   Denies fever, chills, SOB, wheezing, chest pain, nausea, changes in bowel or bladder habits.     ROS: As per HPI.  All other pertinent ROS negative.     Past Medical History:  Diagnosis Date  . Depression   . Erosive esophagitis 04/26/10   EGD by Dr. Gala Romney, small hiatal hernia  . GERD (gastroesophageal reflux disease)   . History of stomach ulcers   . IBS (irritable bowel syndrome)    Diarrhea predominant  . Microscopic colitis 04/26/2010   Colonoscopy by Dr. Gala Romney, good response with Entocort  . Tubular adenoma of colon 04/26/10   Junction of descending and sigmoid 40 CM from anus   Past Surgical History:  Procedure Laterality Date  . ABDOMINAL HYSTERECTOMY    . APPENDECTOMY  2/11   Dr. Romona Curls with a delayed closure  . TONSILLECTOMY    . TUBAL LIGATION     No Known Allergies No current facility-administered medications on file prior to encounter.   Current Outpatient Medications on File Prior to Encounter  Medication Sig Dispense Refill  . Aspirin-Acetaminophen-Caffeine (GOODY HEADACHE PO) Take 1 packet by mouth 2 (two) times daily as needed (for pain).    . cyclobenzaprine (FLEXERIL) 10 MG tablet Take 1 tablet (10 mg total) by mouth 3 (three) times daily as needed for muscle spasms. 10 tablet 0  . diphenhydrAMINE (BENADRYL) 25 mg capsule Take 25 mg by mouth every 6 (six) hours as needed for itching.     . fluticasone (FLONASE) 50 MCG/ACT nasal spray Place 1 spray into both nostrils daily for 14 days. 16 g 0  . ibuprofen (ADVIL) 800 MG tablet  Take 1 tablet (800 mg total) by mouth 3 (three) times daily. 21 tablet 0  . naproxen (NAPROSYN) 500 MG tablet Take 1 tablet (500 mg total) by mouth 2 (two) times daily with a meal. 60 tablet 5  . phenazopyridine (PYRIDIUM) 100 MG tablet Take 1 tablet (100 mg total) by mouth 3 (three) times daily as needed for pain. 15 tablet 0  . [DISCONTINUED] albuterol (PROVENTIL HFA;VENTOLIN HFA) 108 (90 Base) MCG/ACT inhaler Inhale 2 puffs into the lungs every 4 (four) hours as needed for wheezing or shortness of breath. (Patient not taking: Reported on 01/11/2016) 1 Inhaler 0   Social History   Socioeconomic History  . Marital status: Married    Spouse name: Not on file  . Number of children: Not on file  . Years of education: Not on file  . Highest education level: Not on file  Occupational History    Employer: [employer]    Comment: Third shift  Tobacco Use  . Smoking status: Current Every Day Smoker    Packs/day: 0.50  . Smokeless tobacco: Never Used  Substance and Sexual Activity  . Alcohol use: Yes    Alcohol/week: 1.0 standard drink    Types: 1 Glasses of wine per week    Comment: seldom  . Drug use: No  . Sexual activity: Yes    Birth control/protection: Surgical  Other Topics Concern  .  Not on file  Social History Narrative  . Not on file   Social Determinants of Health   Financial Resource Strain:   . Difficulty of Paying Living Expenses: Not on file  Food Insecurity:   . Worried About Charity fundraiser in the Last Year: Not on file  . Ran Out of Food in the Last Year: Not on file  Transportation Needs:   . Lack of Transportation (Medical): Not on file  . Lack of Transportation (Non-Medical): Not on file  Physical Activity:   . Days of Exercise per Week: Not on file  . Minutes of Exercise per Session: Not on file  Stress:   . Feeling of Stress : Not on file  Social Connections:   . Frequency of Communication with Friends and Family: Not on file  . Frequency of  Social Gatherings with Friends and Family: Not on file  . Attends Religious Services: Not on file  . Active Member of Clubs or Organizations: Not on file  . Attends Archivist Meetings: Not on file  . Marital Status: Not on file  Intimate Partner Violence:   . Fear of Current or Ex-Partner: Not on file  . Emotionally Abused: Not on file  . Physically Abused: Not on file  . Sexually Abused: Not on file   Family History  Problem Relation Age of Onset  . Diabetes Mother   . Coronary artery disease Mother   . Healthy Father     OBJECTIVE:  Vitals:   11/22/19 0944  BP: 126/90  Pulse: 96  Resp: 18  Temp: 98.6 F (37 C)  SpO2: 96%     General appearance: alert; appears fatigued, but nontoxic; speaking in full sentences and tolerating own secretions HEENT: NCAT; Ears: EACs clear, TMs pearly gray; Eyes: PERRL.  EOM grossly intact. Sinuses: TTP; Nose: nares patent without rhinorrhea, Throat: oropharynx clear, tonsils non erythematous or enlarged, uvula midline  Neck: supple without LAD Lungs: unlabored respirations, symmetrical air entry; cough: absent; no respiratory distress; CTAB Heart: regular rate and rhythm.   Skin: warm and dry Psychological: alert and cooperative; normal mood and affect  ASSESSMENT & PLAN:  1. Encounter for screening for COVID-19   2. Acute non-recurrent sinusitis, unspecified location   3. Cough     Meds ordered this encounter  Medications  . amoxicillin-clavulanate (AUGMENTIN) 875-125 MG tablet    Sig: Take 1 tablet by mouth every 12 (twelve) hours for 10 days.    Dispense:  20 tablet    Refill:  0    Order Specific Question:   Supervising Provider    Answer:   Raylene Everts [[id number]]  . predniSONE (DELTASONE) 20 MG tablet    Sig: Take 1 tablet (20 mg total) by mouth 2 (two) times daily with a meal for 5 days.    Dispense:  10 tablet    Refill:  0    Order Specific Question:   Supervising Provider    Answer:   Raylene Everts [[id number]]   COVID testing ordered.  It will take between 5-7 days for test results.  Someone will contact you regarding abnormal results.    In the meantime: You should remain isolated in your home for 10 days from symptom onset AND greater than 72 hours after symptoms resolution (absence of fever without the use of fever-reducing medication and improvement in respiratory symptoms), whichever is longer Get plenty of rest and push fluids Augmentin prescribed.  Take as directed and  to completion Prednisone for congestion Use OTC zyrtec for nasal congestion, runny nose, and/or sore throat Use OTC flonase for nasal congestion and runny nose Use medications daily for symptom relief Use OTC medications like ibuprofen or tylenol as needed fever or pain Call or go to the ED if you have any new or worsening symptoms such as fever, worsening cough, shortness of breath, chest tightness, chest pain, turning blue, changes in mental status, etc...   Reviewed expectations re: course of current medical issues. Questions answered. Outlined signs and symptoms indicating need for more acute intervention. Patient verbalized understanding. After Visit Summary given.         Lestine Box, PA-C 11/22/19 [phone]

## 2019-11-23 LAB — NOVEL CORONAVIRUS, NAA: SARS-CoV-2, NAA: NOT DETECTED

## 2019-11-23 LAB — SARS-COV-2, NAA 2 DAY TAT

## 2020-06-13 ENCOUNTER — Ambulatory Visit
Admission: RE | Admit: 2020-06-13 | Discharge: 2020-06-13 | Disposition: A | Discharge status: Home / Self Care | Payer: 59 | Source: Ambulatory Visit | Attending: Family Medicine | Admitting: Family Medicine

## 2020-06-13 ENCOUNTER — Other Ambulatory Visit: Payer: Self-pay

## 2020-06-13 VITALS — HR 79 | Temp 98.4°F | Resp 17

## 2020-06-13 DIAGNOSIS — H66001 Acute suppurative otitis media without spontaneous rupture of ear drum, right ear: Secondary | ICD-10-CM

## 2020-06-13 DIAGNOSIS — B349 Viral infection, unspecified: Secondary | ICD-10-CM

## 2020-06-13 MED ORDER — AMOXICILLIN-POT CLAVULANATE 875-125 MG PO TABS: 1.0000 | ORAL_TABLET | Freq: Two times a day (BID) | ORAL | 0 refills | Status: AC

## 2020-06-13 NOTE — Discharge Instructions (Signed)
I have sent in Augmentin for you to take twice a day for 7 days.  Your COVID and Influenza tests are pending.  You should self quarantine until the test results are back.    Take Tylenol or ibuprofen as needed for fever or discomfort.  Rest and keep yourself hydrated.    Follow-up with your primary care provider if your symptoms are not improving.

## 2020-06-13 NOTE — ED Provider Notes (Signed)
[facility]    CSN: [id number] Arrival date & time: 06/13/20  1246      History   Chief Complaint No chief complaint on file.   HPI Monica Soto is a 55 y.o. female.   Reports headache, body aches, chills, cough, burning and watery eyes, right ear pain, mild cough for the last 2 days. States that she is a current smoker. Has medical hx significant for depression, erosive esophagitis, tubular adenoma of colon, hx stomach ulcers. Has taken tylenol/ibuprofen at home for this. Denies sick contacts. Has positive hx Covid. Has not completed Covid vaccines. Has not completed flu vaccine. Denies abdominal pain, nausea, vomiting, diarrhea, rash, other symptoms.  ROS per HPI  The history is provided by the patient.    Past Medical History:  Diagnosis Date  . Depression   . Erosive esophagitis 04/26/10   EGD by Dr. Gala Romney, small hiatal hernia  . GERD (gastroesophageal reflux disease)   . History of stomach ulcers   . IBS (irritable bowel syndrome)    Diarrhea predominant  . Microscopic colitis 04/26/2010   Colonoscopy by Dr. Gala Romney, good response with Entocort  . Tubular adenoma of colon 04/26/10   Junction of descending and sigmoid 40 CM from anus    Patient Active Problem List   Diagnosis Date Noted  . Lymphocytic colitis 05/30/2010  . GERD 04/17/2010  . IRRITABLE BOWEL SYNDROME 02/14/2010  . DIARRHEA 02/14/2010  . ABDOMINAL PAIN, MILD 02/14/2010    Past Surgical History:  Procedure Laterality Date  . ABDOMINAL HYSTERECTOMY    . APPENDECTOMY  2/11   Dr. Romona Curls with a delayed closure  . TONSILLECTOMY    . TUBAL LIGATION      OB History   No obstetric history on file.      Home Medications    Prior to Admission medications   Medication Sig Start Date End Date Taking? Authorizing Provider  amoxicillin-clavulanate (AUGMENTIN) 875-125 MG tablet Take 1 tablet by mouth 2 (two) times daily for 7 days. 06/13/20 06/20/20 Yes Faustino Congress, NP   Aspirin-Acetaminophen-Caffeine (GOODY HEADACHE PO) Take 1 packet by mouth 2 (two) times daily as needed (for pain).    [provider]  cyclobenzaprine (FLEXERIL) 10 MG tablet Take 1 tablet (10 mg total) by mouth 3 (three) times daily as needed for muscle spasms. 08/13/19   Montine Circle, PA-C  diphenhydrAMINE (BENADRYL) 25 mg capsule Take 25 mg by mouth every 6 (six) hours as needed for itching.     [provider]  fluticasone (FLONASE) 50 MCG/ACT nasal spray Place 1 spray into both nostrils daily for 14 days. 05/25/19 06/08/19  Avegno, Darrelyn Hillock, FNP  ibuprofen (ADVIL) 800 MG tablet Take 1 tablet (800 mg total) by mouth 3 (three) times daily. 08/13/19   Montine Circle, PA-C  naproxen (NAPROSYN) 500 MG tablet Take 1 tablet (500 mg total) by mouth 2 (two) times daily with a meal. 01/11/16   Sanjuana Kava, MD  phenazopyridine (PYRIDIUM) 100 MG tablet Take 1 tablet (100 mg total) by mouth 3 (three) times daily as needed for pain. 10/31/19   Avegno, Darrelyn Hillock, FNP  albuterol (PROVENTIL HFA;VENTOLIN HFA) 108 (90 Base) MCG/ACT inhaler Inhale 2 puffs into the lungs every 4 (four) hours as needed for wheezing or shortness of breath. Patient not taking: Reported on 01/11/2016 04/28/15 09/11/18  Janne Napoleon, NP    Family History Family History  Problem Relation Age of Onset  . Diabetes Mother   . Coronary artery disease  Mother   . Healthy Father     Social History Social History   Tobacco Use  . Smoking status: Current Every Day Smoker    Packs/day: 0.50  . Smokeless tobacco: Never Used  Substance Use Topics  . Alcohol use: Yes    Alcohol/week: 1.0 standard drink    Types: 1 Glasses of wine per week    Comment: seldom  . Drug use: No     Allergies   Patient has no known allergies.   Review of Systems Review of Systems   Physical Exam Triage Vital Signs ED Triage Vitals  Enc Vitals Group     BP --      Pulse Rate 06/13/20 1302 79     Resp 06/13/20 1302 17      Temp 06/13/20 1302 98.4 F (36.9 C)     Temp Source 06/13/20 1302 Oral     SpO2 06/13/20 1302 94 %     Weight --      Height --      Head Circumference --      Peak Flow --      Pain Score 06/13/20 1301 0     Pain Loc --      Pain Edu? --      Excl. in [place]? --    No data found.  Updated Vital Signs Pulse 79   Temp 98.4 F (36.9 C) (Oral)   Resp 17   SpO2 94%      Physical Exam Vitals and nursing note reviewed.  Constitutional:      General: She is not in acute distress.    Appearance: Normal appearance. She is well-developed and normal weight. She is ill-appearing.  HENT:     Head: Normocephalic and atraumatic.     Right Ear: Ear canal and external ear normal. A middle ear effusion is present. Tympanic membrane is erythematous and bulging.     Left Ear: Tympanic membrane, ear canal and external ear normal.     Nose: Congestion and rhinorrhea present.     Mouth/Throat:     Mouth: Mucous membranes are moist.     Pharynx: Posterior oropharyngeal erythema present.  Eyes:     Extraocular Movements: Extraocular movements intact.     Conjunctiva/sclera: Conjunctivae normal.     Pupils: Pupils are equal, round, and reactive to light.  Cardiovascular:     Rate and Rhythm: Normal rate and regular rhythm.     Heart sounds: Normal heart sounds. No murmur heard.   Pulmonary:     Effort: Pulmonary effort is normal. No respiratory distress.     Breath sounds: Normal breath sounds. No stridor. No wheezing, rhonchi or rales.  Chest:     Chest wall: No tenderness.  Abdominal:     General: Bowel sounds are normal. There is no distension.     Palpations: Abdomen is soft. There is no mass.     Tenderness: There is no abdominal tenderness. There is no right CVA tenderness, left CVA tenderness, guarding or rebound.     Hernia: No hernia is present.  Musculoskeletal:        General: Normal range of motion.     Cervical back: Normal range of motion and neck supple.  Lymphadenopathy:      Cervical: Cervical adenopathy present.  Skin:    General: Skin is warm and dry.  Neurological:     General: No focal deficit present.     Mental Status: She is alert and oriented  to person, place, and time.  Psychiatric:        Mood and Affect: Mood normal.        Behavior: Behavior normal.        Thought Content: Thought content normal.      UC Treatments / Results  Labs (all labs ordered are listed, but only abnormal results are displayed) Labs Reviewed  COVID-19, FLU A+B NAA    EKG   Radiology No results found.  Procedures Procedures (including critical care time)  Medications Ordered in UC Medications - No data to display  Initial Impression / Assessment and Plan / UC Course  I have reviewed the triage vital signs and the nursing notes.  Pertinent labs & imaging results that were available during my care of the patient were reviewed by me and considered in my medical decision making (see chart for details).    Viral Illness Right Otitis Media  Prescribed Augmentin 875mg  BID x 7 days Covid, flu swab obtained in office today.   Patient instructed to quarantine until results are back and negative.   If results are negative, patient may resume daily schedule as tolerated once they are fever free for 24 hours without the use of antipyretic medications.   If results are positive, patient instructed to quarantine for at least 5 days from symptom onset.  If after 5 days symptoms have resolved, may return to work with a well fitting mask for the next 5 days. If symptomatic after day 5, isolation should be extended to 10 days. Patient instructed to follow-up with primary care or with this office as needed.   Patient instructed to follow-up in the ER for trouble swallowing, trouble breathing, other concerning symptoms.   Final Clinical Impressions(s) / UC Diagnoses   Final diagnoses:  Viral illness  Non-recurrent acute suppurative otitis media of right ear  without spontaneous rupture of tympanic membrane     Discharge Instructions     I have sent in Augmentin for you to take twice a day for 7 days.  Your COVID and Influenza tests are pending.  You should self quarantine until the test results are back.    Take Tylenol or ibuprofen as needed for fever or discomfort.  Rest and keep yourself hydrated.    Follow-up with your primary care provider if your symptoms are not improving.        ED Prescriptions    Medication Sig Dispense Auth. Provider   amoxicillin-clavulanate (AUGMENTIN) 875-125 MG tablet Take 1 tablet by mouth 2 (two) times daily for 7 days. 14 tablet Faustino Congress, NP     PDMP not reviewed this encounter.   Faustino Congress, NP 06/13/20 1332

## 2020-06-13 NOTE — ED Triage Notes (Signed)
Headache, body aches, eyes are burning, RT ear pain, cough that started yesterday

## 2020-06-14 LAB — COVID-19, FLU A+B NAA
Influenza A, NAA: NOT DETECTED
Influenza B, NAA: NOT DETECTED
SARS-CoV-2, NAA: NOT DETECTED

## 2020-09-26 ENCOUNTER — Other Ambulatory Visit: Payer: Self-pay

## 2020-09-26 ENCOUNTER — Ambulatory Visit
Admission: RE | Admit: 2020-09-26 | Discharge: 2020-09-26 | Disposition: A | Discharge status: Home / Self Care | Payer: 59 | Source: Ambulatory Visit | Attending: Family Medicine | Admitting: Family Medicine

## 2020-09-26 VITALS — BP 143/74 | HR 79 | Temp 98.4°F | Resp 18

## 2020-09-26 DIAGNOSIS — L249 Irritant contact dermatitis, unspecified cause: Secondary | ICD-10-CM

## 2020-09-26 MED ORDER — DEXAMETHASONE SODIUM PHOSPHATE 10 MG/ML IJ SOLN
10.0000 mg | Freq: Once | INTRAMUSCULAR | Status: AC
  Administered 2020-09-26: 10 mg via INTRAMUSCULAR

## 2020-09-26 MED ORDER — TRIAMCINOLONE ACETONIDE 0.1 % EX CREA: 1.0000 "application " | TOPICAL_CREAM | Freq: Two times a day (BID) | CUTANEOUS | 0 refills | Status: DC

## 2020-09-26 MED ORDER — PREDNISONE 50 MG PO TABS: 50.0000 mg | ORAL_TABLET | Freq: Every day | ORAL | 0 refills | Status: AC

## 2020-09-26 MED ORDER — TRIAMCINOLONE ACETONIDE 0.1 % EX CREA: 1.0000 "application " | TOPICAL_CREAM | Freq: Three times a day (TID) | CUTANEOUS | 0 refills | Status: DC

## 2020-09-26 NOTE — ED Triage Notes (Signed)
Pt reports break rash after working in yard on SAT.

## 2020-09-26 NOTE — ED Provider Notes (Signed)
[facility]    CSN: [id number] Arrival date & time: 09/26/20  0947      History   Chief Complaint Chief Complaint  Patient presents with   Rash    HPI Monica Soto is a 55 y.o. female.   HPI Patient presents today for evaluation of multiple pruritic skin eruptions bilateral upper extremities and lower extremities and the side of her face.  She has been outside doing yard work and took preventative precautions to avoid any contact with any irritant plants however subsequently developed this rash.  Rash is very pruritic.  She is been taking Benadryl without any relief of symptoms.  Denies any recent illness.  Denies any contact with the inside of her eyes.  Past Medical History:  Diagnosis Date   Depression    Erosive esophagitis 04/26/10   EGD by Dr. Gala Romney, small hiatal hernia   GERD (gastroesophageal reflux disease)    History of stomach ulcers    IBS (irritable bowel syndrome)    Diarrhea predominant   Microscopic colitis 04/26/2010   Colonoscopy by Dr. Gala Romney, good response with Entocort   Tubular adenoma of colon 04/26/10   Junction of descending and sigmoid 40 CM from anus    Patient Active Problem List   Diagnosis Date Noted   Lymphocytic colitis 05/30/2010   GERD 04/17/2010   IRRITABLE BOWEL SYNDROME 02/14/2010   DIARRHEA 02/14/2010   ABDOMINAL PAIN, MILD 02/14/2010    Past Surgical History:  Procedure Laterality Date   ABDOMINAL HYSTERECTOMY     APPENDECTOMY  2/11   Dr. Romona Curls with a delayed closure   TONSILLECTOMY     TUBAL LIGATION      OB History   No obstetric history on file.      Home Medications    Prior to Admission medications   Medication Sig Start Date End Date Taking? Authorizing Provider  predniSONE (DELTASONE) 50 MG tablet Take 1 tablet (50 mg total) by mouth daily with breakfast for 5 days. 09/26/20 10/01/20 Yes Scot Jun, FNP  Aspirin-Acetaminophen-Caffeine (GOODY HEADACHE PO) Take 1 packet by mouth 2 (two)  times daily as needed (for pain).    [provider]  cyclobenzaprine (FLEXERIL) 10 MG tablet Take 1 tablet (10 mg total) by mouth 3 (three) times daily as needed for muscle spasms. 08/13/19   Montine Circle, PA-C  diphenhydrAMINE (BENADRYL) 25 mg capsule Take 25 mg by mouth every 6 (six) hours as needed for itching.     [provider]  fluticasone (FLONASE) 50 MCG/ACT nasal spray Place 1 spray into both nostrils daily for 14 days. 05/25/19 06/08/19  Avegno, Darrelyn Hillock, FNP  ibuprofen (ADVIL) 800 MG tablet Take 1 tablet (800 mg total) by mouth 3 (three) times daily. 08/13/19   Montine Circle, PA-C  naproxen (NAPROSYN) 500 MG tablet Take 1 tablet (500 mg total) by mouth 2 (two) times daily with a meal. 01/11/16   Sanjuana Kava, MD  phenazopyridine (PYRIDIUM) 100 MG tablet Take 1 tablet (100 mg total) by mouth 3 (three) times daily as needed for pain. 10/31/19   Avegno, Darrelyn Hillock, FNP  triamcinolone cream (KENALOG) 0.1 % Apply 1 application topically 3 (three) times daily. 09/26/20   Scot Jun, FNP  albuterol (PROVENTIL HFA;VENTOLIN HFA) 108 (90 Base) MCG/ACT inhaler Inhale 2 puffs into the lungs every 4 (four) hours as needed for wheezing or shortness of breath. Patient not taking: Reported on 01/11/2016 04/28/15 09/11/18  Janne Napoleon, NP    Family  History Family History  Problem Relation Age of Onset   Diabetes Mother    Coronary artery disease Mother    Healthy Father     Social History Social History   Tobacco Use   Smoking status: Every Day    Packs/day: 0.50    Types: Cigarettes   Smokeless tobacco: Never  Substance Use Topics   Alcohol use: Yes    Alcohol/week: 1.0 standard drink    Types: 1 Glasses of wine per week    Comment: seldom   Drug use: No     Allergies   Patient has no known allergies.   Review of Systems Review of Systems Pertinent negatives listed in HPI  Physical Exam Triage Vital Signs ED Triage Vitals [09/26/20 1013]  Enc Vitals  Group     BP (!) 143/74     Pulse Rate 79     Resp 18     Temp 98.4 F (36.9 C)     Temp src      SpO2 95 %     Weight      Height      Head Circumference      Peak Flow      Pain Score 0     Pain Loc      Pain Edu?      Excl. in [place]?    No data found.  Updated Vital Signs BP (!) 143/74   Pulse 79   Temp 98.4 F (36.9 C)   Resp 18   SpO2 95%   Visual Acuity Right Eye Distance:   Left Eye Distance:   Bilateral Distance:    Right Eye Near:   Left Eye Near:    Bilateral Near:     Physical Exam General appearance: Alert, well developed, well nourished, cooperative  Head: Normocephalic, without obvious abnormality, atraumatic Respiratory: Respirations even and unlabored, normal respiratory rate Heart: Rate and rhythm normal. No gallop or murmurs noted on exam  Abdomen: BS +, no distention, no rebound tenderness, or no mass Extremities: No gross deformities Skin: Skin color is normal. Macular erythematous rash present  Psych: Appropriate mood and affect. Neurologic: GCS 15, normal coordination, normal gait  UC Treatments / Results  Labs (all labs ordered are listed, but only abnormal results are displayed) Labs Reviewed - No data to display  EKG   Radiology No results found.  Procedures Procedures (including critical care time)  Medications Ordered in UC Medications  dexamethasone (DECADRON) injection 10 mg (10 mg Intramuscular Given 09/26/20 1118)    Initial Impression / Assessment and Plan / UC Course  I have reviewed the triage vital signs and the nursing notes.  Pertinent labs & imaging results that were available during my care of the patient were reviewed by me and considered in my medical decision making (see chart for details).     Irritant contact dermatitis treatment today with Decadron 10 mg IM given here in clinic.  Patient will continue outpatient management of skin irritant rash with prednisone 50 mg once daily for 5 days starting  tomorrow.  Triamcinolone cream TID as needed for itching.  Return precautions given if symptoms worsen or do not improve. Final Clinical Impressions(s) / UC Diagnoses   Final diagnoses:  Irritant contact dermatitis, unspecified trigger   Discharge Instructions   None    ED Prescriptions     Medication Sig Dispense Auth. Provider   triamcinolone cream (KENALOG) 0.1 %  (Status: Discontinued) Apply 1 application topically 2 (two) times  daily. 60 g Scot Jun, FNP   predniSONE (DELTASONE) 50 MG tablet Take 1 tablet (50 mg total) by mouth daily with breakfast for 5 days. 5 tablet Scot Jun, FNP   triamcinolone cream (KENALOG) 0.1 % Apply 1 application topically 3 (three) times daily. 60 g Scot Jun, FNP      PDMP not reviewed this encounter.   Scot Jun, FNP 09/26/20 1119

## 2020-10-31 ENCOUNTER — Telehealth: Payer: 59 | Admitting: Physician Assistant

## 2020-10-31 DIAGNOSIS — B9689 Other specified bacterial agents as the cause of diseases classified elsewhere: Secondary | ICD-10-CM | POA: Diagnosis not present

## 2020-10-31 DIAGNOSIS — J019 Acute sinusitis, unspecified: Secondary | ICD-10-CM

## 2020-10-31 MED ORDER — BENZONATATE 100 MG PO CAPS
100.0000 mg | ORAL_CAPSULE | Freq: Three times a day (TID) | ORAL | 0 refills | Status: DC | PRN
Start: 2020-10-31 — End: 2021-01-10

## 2020-10-31 MED ORDER — AMOXICILLIN-POT CLAVULANATE 875-125 MG PO TABS: 1.0000 | ORAL_TABLET | Freq: Two times a day (BID) | ORAL | 0 refills | Status: DC

## 2020-10-31 NOTE — Progress Notes (Signed)
I have spent 5 minutes in review of e-visit questionnaire, review and updating patient chart, medical decision making and response to patient.   Evelio Rueda Cody Lorenza Shakir, PA-C    

## 2020-10-31 NOTE — Progress Notes (Signed)
E-Visit for Sinus Problems  We are sorry that you are not feeling well.  Here is how we plan to help!  Based on what you have shared with me it looks like you have sinusitis.  Sinusitis is inflammation and infection in the sinus cavities of the head.  Based on your presentation I believe you most likely have Acute Bacterial Sinusitis.  This is an infection caused by bacteria and is treated with antibiotics. I have prescribed Augmentin 875mg /125mg  one tablet twice daily with food, for 7 days. You may use an oral decongestant such as Mucinex D or if you have glaucoma or high blood pressure use plain Mucinex. Saline nasal spray help and can safely be used as often as needed for congestion.  If you develop worsening sinus pain, fever or notice severe headache and vision changes, or if symptoms are not better after completion of antibiotic, please schedule an appointment with a health care provider.    I have also sent in a prescription for Tessalon for cough.   Sinus infections are not as easily transmitted as other respiratory infection, however we still recommend that you avoid close contact with loved ones, especially the very young and elderly.  Remember to wash your hands thoroughly throughout the day as this is the number one way to prevent the spread of infection!  Home Care: Only take medications as instructed by your medical team. Complete the entire course of an antibiotic. Do not take these medications with alcohol. A steam or ultrasonic humidifier can help congestion.  You can place a towel over your head and breathe in the steam from hot water coming from a faucet. Avoid close contacts especially the very young and the elderly. Cover your mouth when you cough or sneeze. Always remember to wash your hands.  Get Help Right Away If: You develop worsening fever or sinus pain. You develop a severe head ache or visual changes. Your symptoms persist after you have completed your treatment  plan.  Make sure you Understand these instructions. Will watch your condition. Will get help right away if you are not doing well or get worse.  Thank you for choosing an e-visit.  Your e-visit answers were reviewed by a board certified advanced clinical practitioner to complete your personal care plan. Depending upon the condition, your plan could have included both over the counter or prescription medications.  Please review your pharmacy choice. Make sure the pharmacy is open so you can pick up prescription now. If there is a problem, you may contact your provider through [employer] and have the prescription routed to another pharmacy.  Your safety is important to [place]. If you have drug allergies check your prescription carefully.   For the next 24 hours you can use MyChart to ask questions about today's visit, request a non-urgent call back, or ask for a work or school excuse. You will get an email in the next two days asking about your experience. I hope that your e-visit has been valuable and will speed your recovery.

## 2020-11-29 ENCOUNTER — Encounter: Payer: 59 | Admitting: Obstetrics & Gynecology

## 2021-01-02 ENCOUNTER — Encounter: Payer: 59 | Admitting: Obstetrics & Gynecology

## 2021-01-10 ENCOUNTER — Telehealth: Payer: 59 | Admitting: Emergency Medicine

## 2021-01-10 ENCOUNTER — Telehealth: Payer: 59 | Admitting: Physician Assistant

## 2021-01-10 DIAGNOSIS — J019 Acute sinusitis, unspecified: Secondary | ICD-10-CM

## 2021-01-10 MED ORDER — ONDANSETRON 4 MG PO TBDP: 4.0000 mg | ORAL_TABLET | Freq: Three times a day (TID) | ORAL | 0 refills | Status: DC | PRN

## 2021-01-10 MED ORDER — DOXYCYCLINE HYCLATE 100 MG PO TABS: 100.0000 mg | ORAL_TABLET | Freq: Two times a day (BID) | ORAL | 0 refills | Status: DC

## 2021-01-10 MED ORDER — BENZONATATE 100 MG PO CAPS
100.0000 mg | ORAL_CAPSULE | Freq: Three times a day (TID) | ORAL | 0 refills | Status: DC | PRN
Start: 2021-01-10 — End: 2021-03-16

## 2021-01-10 NOTE — Progress Notes (Signed)
Sick for last several weeks, changing sx. I think she needs video visit.   Based on the information that you have shared in the e-Visit Questionnaire, we recommend that you convert this visit to a video visit in order for the provider to better assess what is going on.  The provider will be able to give you a more accurate diagnosis and treatment plan if we can more freely discuss your symptoms and with the addition of a virtual examination.   If you convert to a video visit, we will bill your insurance (similar to an office visit) and you will not be charged for this e-Visit. You will be able to stay at home and speak with the first available [facility] advanced practice provider. The link to do a video visit is in the drop down Menu tab of your Welcome screen in [place].

## 2021-01-10 NOTE — Progress Notes (Signed)
Virtual Visit Consent   Monica Soto, you are scheduled for a virtual visit with a [place] provider today.     Just as with appointments in the office, your consent must be obtained to participate.  Your consent will be active for this visit and any virtual visit you may have with one of our providers in the next 365 days.     If you have a MyChart account, a copy of this consent can be sent to you electronically.  All virtual visits are billed to your insurance company just like a traditional visit in the office.    As this is a virtual visit, video technology does not allow for your provider to perform a traditional examination.  This may limit your provider's ability to fully assess your condition.  If your provider identifies any concerns that need to be evaluated in person or the need to arrange testing (such as labs, EKG, etc.), we will make arrangements to do so.     Although advances in technology are sophisticated, we cannot ensure that it will always work on either your end or our end.  If the connection with a video visit is poor, the visit may have to be switched to a telephone visit.  With either a video or telephone visit, we are not always able to ensure that we have a secure connection.     I need to obtain your verbal consent now.   Are you willing to proceed with your visit today?    Monica Soto has provided verbal consent on 01/10/2021 for a virtual visit (video or telephone).   Leeanne Rio, [place]   Date: 01/10/2021 2:15 PM   Virtual Visit via Video Note   I, Leeanne Rio, connected with  Monica Soto  ([id number], 1965/06/15) on 01/10/21 at  2:00 PM EST by a video-enabled telemedicine application and verified that I am speaking with the correct person using two identifiers.  Location: Patient: Virtual Visit Location Patient: Parked Writer: Virtual Visit Location Provider: Home Office   I discussed the limitations of evaluation and management  by telemedicine and the availability of in person appointments. The patient expressed understanding and agreed to proceed.    History of Present Illness: Monica Soto is a 55 y.o. who identifies as a female who was assigned female at birth, and is being seen today for possible sinusitis. Endorses dealing with sinus congestion, nasal congestion, ear pressure and sore throat over past week. Initially with a fever that resolved. Now in the past few days with new onset sinus pain and increased fatigue. Denies fever, shortness of breath. Has noted nausea from drainage that is upsetting her stomach..    HPI: HPI  Problems:  Patient Active Problem List   Diagnosis Date Noted   Lymphocytic colitis 05/30/2010   GERD 04/17/2010   IRRITABLE BOWEL SYNDROME 02/14/2010   DIARRHEA 02/14/2010   ABDOMINAL PAIN, MILD 02/14/2010    Allergies: No Known Allergies Medications:  Current Outpatient Medications:    ondansetron (ZOFRAN-ODT) 4 MG disintegrating tablet, Take 1 tablet (4 mg total) by mouth every 8 (eight) hours as needed for nausea or vomiting., Disp: 20 tablet, Rfl: 0   Aspirin-Acetaminophen-Caffeine (GOODY HEADACHE PO), Take 1 packet by mouth 2 (two) times daily as needed (for pain)., Disp: , Rfl:    benzonatate (TESSALON) 100 MG capsule, Take 1 capsule (100 mg total) by mouth 3 (three) times daily as needed for cough., Disp: 30 capsule,  Rfl: 0   cyclobenzaprine (FLEXERIL) 10 MG tablet, Take 1 tablet (10 mg total) by mouth 3 (three) times daily as needed for muscle spasms., Disp: 10 tablet, Rfl: 0   diphenhydrAMINE (BENADRYL) 25 mg capsule, Take 25 mg by mouth every 6 (six) hours as needed for itching. , Disp: , Rfl:    doxycycline (VIBRA-TABS) 100 MG tablet, Take 1 tablet (100 mg total) by mouth 2 (two) times daily., Disp: 20 tablet, Rfl: 0   fluticasone (FLONASE) 50 MCG/ACT nasal spray, Place 1 spray into both nostrils daily for 14 days., Disp: 16 g, Rfl: 0   ibuprofen (ADVIL) 800 MG tablet, Take  1 tablet (800 mg total) by mouth 3 (three) times daily., Disp: 21 tablet, Rfl: 0   naproxen (NAPROSYN) 500 MG tablet, Take 1 tablet (500 mg total) by mouth 2 (two) times daily with a meal., Disp: 60 tablet, Rfl: 5   phenazopyridine (PYRIDIUM) 100 MG tablet, Take 1 tablet (100 mg total) by mouth 3 (three) times daily as needed for pain., Disp: 15 tablet, Rfl: 0   triamcinolone cream (KENALOG) 0.1 %, Apply 1 application topically 3 (three) times daily., Disp: 60 g, Rfl: 0  Observations/Objective: Patient is well-developed, well-nourished in no acute distress.  Resting comfortably in parked car, outside of work.  Head is normocephalic, atraumatic.  No labored breathing. Speech is clear and coherent with logical content.  Patient is alert and oriented at baseline.   Assessment and Plan: 1. Acute non-recurrent sinusitis, unspecified location - benzonatate (TESSALON) 100 MG capsule; Take 1 capsule (100 mg total) by mouth 3 (three) times daily as needed for cough.  Dispense: 30 capsule; Refill: 0 - doxycycline (VIBRA-TABS) 100 MG tablet; Take 1 tablet (100 mg total) by mouth 2 (two) times daily.  Dispense: 20 tablet; Refill: 0 Rx doxycycline.  Increase fluids.  Rest.  Saline nasal spray.  Probiotic.  Mucinex as directed.  Humidifier in bedroom. Tessalon per orders. Rx Zofran for nausea.  Call or return to clinic if symptoms are not improving.   Follow Up Instructions: I discussed the assessment and treatment plan with the patient. The patient was provided an opportunity to ask questions and all were answered. The patient agreed with the plan and demonstrated an understanding of the instructions.  A copy of instructions were sent to the patient via MyChart unless otherwise noted below.   The patient was advised to call back or seek an in-person evaluation if the symptoms worsen or if the condition fails to improve as anticipated.  Time:  I spent 12 minutes with the patient via telehealth technology  discussing the above problems/concerns.    Leeanne Rio, PA-C

## 2021-01-10 NOTE — Patient Instructions (Signed)
Georgiann Cocker, thank you for joining Leeanne Rio, PA-C for today's virtual visit.  While this provider is not your primary care provider (PCP), if your PCP is located in our provider database this encounter information will be shared with them immediately following your visit.  Consent: (Patient) Monica Soto provided verbal consent for this virtual visit at the beginning of the encounter.  Current Medications:  Current Outpatient Medications:    amoxicillin-clavulanate (AUGMENTIN) 875-125 MG tablet, Take 1 tablet by mouth 2 (two) times daily., Disp: 14 tablet, Rfl: 0   Aspirin-Acetaminophen-Caffeine (GOODY HEADACHE PO), Take 1 packet by mouth 2 (two) times daily as needed (for pain)., Disp: , Rfl:    benzonatate (TESSALON) 100 MG capsule, Take 1 capsule (100 mg total) by mouth 3 (three) times daily as needed for cough., Disp: 30 capsule, Rfl: 0   cyclobenzaprine (FLEXERIL) 10 MG tablet, Take 1 tablet (10 mg total) by mouth 3 (three) times daily as needed for muscle spasms., Disp: 10 tablet, Rfl: 0   diphenhydrAMINE (BENADRYL) 25 mg capsule, Take 25 mg by mouth every 6 (six) hours as needed for itching. , Disp: , Rfl:    fluticasone (FLONASE) 50 MCG/ACT nasal spray, Place 1 spray into both nostrils daily for 14 days., Disp: 16 g, Rfl: 0   ibuprofen (ADVIL) 800 MG tablet, Take 1 tablet (800 mg total) by mouth 3 (three) times daily., Disp: 21 tablet, Rfl: 0   naproxen (NAPROSYN) 500 MG tablet, Take 1 tablet (500 mg total) by mouth 2 (two) times daily with a meal., Disp: 60 tablet, Rfl: 5   phenazopyridine (PYRIDIUM) 100 MG tablet, Take 1 tablet (100 mg total) by mouth 3 (three) times daily as needed for pain., Disp: 15 tablet, Rfl: 0   triamcinolone cream (KENALOG) 0.1 %, Apply 1 application topically 3 (three) times daily., Disp: 60 g, Rfl: 0   Medications ordered in this encounter:  No orders of the defined types were placed in this encounter.    *If you need refills on other  medications prior to your next appointment, please contact your pharmacy*  Follow-Up: Call back or seek an in-person evaluation if the symptoms worsen or if the condition fails to improve as anticipated.  Other Instructions Please take antibiotic as directed.  Increase fluid intake.  Use Saline nasal spray.  Take a daily multivitamin. Use Tessalon as directed for cough.  Place a humidifier in the bedroom.  Please call or return clinic if symptoms are not improving.  Sinusitis Sinusitis is redness, soreness, and swelling (inflammation) of the paranasal sinuses. Paranasal sinuses are air pockets within the bones of your face (beneath the eyes, the middle of the forehead, or above the eyes). In healthy paranasal sinuses, mucus is able to drain out, and air is able to circulate through them by way of your nose. However, when your paranasal sinuses are inflamed, mucus and air can become trapped. This can allow bacteria and other germs to grow and cause infection. Sinusitis can develop quickly and last only a short time (acute) or continue over a long period (chronic). Sinusitis that lasts for more than 12 weeks is considered chronic.  CAUSES  Causes of sinusitis include: Allergies. Structural abnormalities, such as displacement of the cartilage that separates your nostrils (deviated septum), which can decrease the air flow through your nose and sinuses and affect sinus drainage. Functional abnormalities, such as when the small hairs (cilia) that line your sinuses and help remove mucus do not work properly  or are not present. SYMPTOMS  Symptoms of acute and chronic sinusitis are the same. The primary symptoms are pain and pressure around the affected sinuses. Other symptoms include: Upper toothache. Earache. Headache. Bad breath. Decreased sense of smell and taste. A cough, which worsens when you are lying flat. Fatigue. Fever. Thick drainage from your nose, which often is green and may contain  pus (purulent). Swelling and warmth over the affected sinuses. DIAGNOSIS  Your caregiver will perform a physical exam. During the exam, your caregiver may: Look in your nose for signs of abnormal growths in your nostrils (nasal polyps). Tap over the affected sinus to check for signs of infection. View the inside of your sinuses (endoscopy) with a special imaging device with a light attached (endoscope), which is inserted into your sinuses. If your caregiver suspects that you have chronic sinusitis, one or more of the following tests may be recommended: Allergy tests. Nasal culture A sample of mucus is taken from your nose and sent to a lab and screened for bacteria. Nasal cytology A sample of mucus is taken from your nose and examined by your caregiver to determine if your sinusitis is related to an allergy. TREATMENT  Most cases of acute sinusitis are related to a viral infection and will resolve on their own within 10 days. Sometimes medicines are prescribed to help relieve symptoms (pain medicine, decongestants, nasal steroid sprays, or saline sprays).  However, for sinusitis related to a bacterial infection, your caregiver will prescribe antibiotic medicines. These are medicines that will help kill the bacteria causing the infection.  Rarely, sinusitis is caused by a fungal infection. In theses cases, your caregiver will prescribe antifungal medicine. For some cases of chronic sinusitis, surgery is needed. Generally, these are cases in which sinusitis recurs more than 3 times per year, despite other treatments. HOME CARE INSTRUCTIONS  Drink plenty of water. Water helps thin the mucus so your sinuses can drain more easily. Use a humidifier. Inhale steam 3 to 4 times a day (for example, sit in the bathroom with the shower running). Apply a warm, moist washcloth to your face 3 to 4 times a day, or as directed by your caregiver. Use saline nasal sprays to help moisten and clean your  sinuses. Take over-the-counter or prescription medicines for pain, discomfort, or fever only as directed by your caregiver. SEEK IMMEDIATE MEDICAL CARE IF: You have increasing pain or severe headaches. You have nausea, vomiting, or drowsiness. You have swelling around your face. You have vision problems. You have a stiff neck. You have difficulty breathing. MAKE SURE YOU:  Understand these instructions. Will watch your condition. Will get help right away if you are not doing well or get worse. Document Released: 01/22/2005 Document Revised: 04/16/2011 Document Reviewed: 02/06/2011 [facility] Patient Information 2014 [place], [place].    If you have been instructed to have an in-person evaluation today at a local Urgent Care facility, please use the link below. It will take you to a list of all of our available [facility], including address, phone number and hours of operation. Please do not delay care.  [facility]  If you or a family member do not have a primary care provider, use the link below to schedule a visit and establish care. When you choose a [facility] primary care physician or advanced practice provider, you gain a long-term partner in health. Find a Primary Care Provider  Learn more about 's in-office and virtual care options:  [facility]

## 2021-02-09 ENCOUNTER — Other Ambulatory Visit: Payer: Self-pay

## 2021-02-09 ENCOUNTER — Ambulatory Visit
Admission: RE | Admit: 2021-02-09 | Discharge: 2021-02-09 | Disposition: A | Discharge status: Home / Self Care | Payer: 59 | Source: Ambulatory Visit | Attending: Family Medicine | Admitting: Family Medicine

## 2021-02-09 VITALS — BP 188/102 | HR 74 | Temp 98.2°F | Resp 16

## 2021-02-09 DIAGNOSIS — J209 Acute bronchitis, unspecified: Secondary | ICD-10-CM

## 2021-02-09 DIAGNOSIS — R0781 Pleurodynia: Secondary | ICD-10-CM | POA: Diagnosis not present

## 2021-02-09 DIAGNOSIS — J3089 Other allergic rhinitis: Secondary | ICD-10-CM | POA: Diagnosis not present

## 2021-02-09 MED ORDER — CYCLOBENZAPRINE HCL 10 MG PO TABS: 5.0000 mg | ORAL_TABLET | Freq: Three times a day (TID) | ORAL | 0 refills | Status: DC | PRN

## 2021-02-09 MED ORDER — PROMETHAZINE-DM 6.25-15 MG/5ML PO SYRP: 5.0000 mL | ORAL_SOLUTION | Freq: Four times a day (QID) | ORAL | 0 refills | Status: DC | PRN

## 2021-02-09 MED ORDER — PREDNISONE 20 MG PO TABS: 40.0000 mg | ORAL_TABLET | Freq: Every day | ORAL | 0 refills | Status: DC

## 2021-02-09 MED ORDER — CETIRIZINE HCL 10 MG PO TABS: 10.0000 mg | ORAL_TABLET | Freq: Every day | ORAL | 2 refills | Status: DC

## 2021-02-09 MED ORDER — FLUTICASONE PROPIONATE 50 MCG/ACT NA SUSP: 1.0000 | Freq: Two times a day (BID) | NASAL | 2 refills | Status: DC

## 2021-02-09 NOTE — ED Triage Notes (Signed)
Patient states that the week of Thanksgiving everyone in the house was sick. They were prescribed Doxycycline and Tessalon [place].  Patient states that she just got over a stomach virus and the coughing started back up.   Patient states the cough is causing a pain in the left side of her abdomin.  Patient states she has been taking Robitussin for her cough.   Patient does not want Covid/ Flu Swab   Denies Fever

## 2021-02-09 NOTE — ED Provider Notes (Signed)
[facility]    CSN: [id number] Arrival date & time: 02/09/21  1743      History   Chief Complaint Chief Complaint  Patient presents with   Cough    HPI Monica Soto is a 56 y.o. female.   Presenting today with over a week of sinus drainage, hacking productive cough and now left anterior rib pain worse with movement and coughing, deep breaths.  Denies fever, chills, chest pain, shortness of breath, wheezing, abdominal pain, nausea vomiting or diarrhea.  So far has been taking Robitussin with minimal relief.  States she gets bronchitis and sinus infections frequently.  Last treated about a month ago with doxycycline and Tessalon with relief for a few weeks.   Past Medical History:  Diagnosis Date   Depression    Erosive esophagitis 04/26/10   EGD by Dr. Gala Romney, small hiatal hernia   GERD (gastroesophageal reflux disease)    History of stomach ulcers    IBS (irritable bowel syndrome)    Diarrhea predominant   Microscopic colitis 04/26/2010   Colonoscopy by Dr. Gala Romney, good response with Entocort   Tubular adenoma of colon 04/26/10   Junction of descending and sigmoid 40 CM from anus    Patient Active Problem List   Diagnosis Date Noted   Lymphocytic colitis 05/30/2010   GERD 04/17/2010   IRRITABLE BOWEL SYNDROME 02/14/2010   DIARRHEA 02/14/2010   ABDOMINAL PAIN, MILD 02/14/2010    Past Surgical History:  Procedure Laterality Date   ABDOMINAL HYSTERECTOMY     APPENDECTOMY  2/11   Dr. Romona Curls with a delayed closure   TONSILLECTOMY     TUBAL LIGATION      OB History   No obstetric history on file.      Home Medications    Prior to Admission medications   Medication Sig Start Date End Date Taking? Authorizing Provider  cetirizine (ZYRTEC ALLERGY) 10 MG tablet Take 1 tablet (10 mg total) by mouth daily. 02/09/21  Yes Volney American, PA-C  cyclobenzaprine (FLEXERIL) 10 MG tablet Take 0.5-1 tablets (5-10 mg total) by mouth 3 (three) times daily as  needed for muscle spasms. Do not drink alcohol or drive while taking this medication.  May cause drowsiness. 02/09/21  Yes Volney American, PA-C  fluticasone [facility]) 50 MCG/ACT nasal spray Place 1 spray into both nostrils 2 (two) times daily. 02/09/21  Yes Volney American, PA-C  predniSONE (DELTASONE) 20 MG tablet Take 2 tablets (40 mg total) by mouth daily with breakfast. 02/09/21  Yes Volney American, PA-C  promethazine-dextromethorphan (PROMETHAZINE-DM) 6.25-15 MG/5ML syrup Take 5 mLs by mouth 4 (four) times daily as needed. 02/09/21  Yes Volney American, PA-C  Aspirin-Acetaminophen-Caffeine (GOODY HEADACHE PO) Take 1 packet by mouth 2 (two) times daily as needed (for pain).    [provider]  benzonatate (TESSALON) 100 MG capsule Take 1 capsule (100 mg total) by mouth 3 (three) times daily as needed for cough. 01/10/21   Brunetta Jeans, PA-C  cyclobenzaprine (FLEXERIL) 10 MG tablet Take 1 tablet (10 mg total) by mouth 3 (three) times daily as needed for muscle spasms. 08/13/19   Montine Circle, PA-C  diphenhydrAMINE (BENADRYL) 25 mg capsule Take 25 mg by mouth every 6 (six) hours as needed for itching.     [provider]  doxycycline (VIBRA-TABS) 100 MG tablet Take 1 tablet (100 mg total) by mouth 2 (two) times daily. 01/10/21   Brunetta Jeans, PA-C  fluticasone (FLONASE) 50  MCG/ACT nasal spray Place 1 spray into both nostrils daily for 14 days. 05/25/19 06/08/19  Avegno, Darrelyn Hillock, FNP  ibuprofen (ADVIL) 800 MG tablet Take 1 tablet (800 mg total) by mouth 3 (three) times daily. 08/13/19   Montine Circle, PA-C  naproxen (NAPROSYN) 500 MG tablet Take 1 tablet (500 mg total) by mouth 2 (two) times daily with a meal. 01/11/16   Sanjuana Kava, MD  ondansetron (ZOFRAN-ODT) 4 MG disintegrating tablet Take 1 tablet (4 mg total) by mouth every 8 (eight) hours as needed for nausea or vomiting. 01/10/21   Brunetta Jeans, PA-C  phenazopyridine (PYRIDIUM) 100 MG  tablet Take 1 tablet (100 mg total) by mouth 3 (three) times daily as needed for pain. 10/31/19   Avegno, Darrelyn Hillock, FNP  triamcinolone cream (KENALOG) 0.1 % Apply 1 application topically 3 (three) times daily. 09/26/20   Scot Jun, FNP  albuterol (PROVENTIL HFA;VENTOLIN HFA) 108 (90 Base) MCG/ACT inhaler Inhale 2 puffs into the lungs every 4 (four) hours as needed for wheezing or shortness of breath. Patient not taking: Reported on 01/11/2016 04/28/15 09/11/18  Janne Napoleon, NP    Family History Family History  Problem Relation Age of Onset   Diabetes Mother    Coronary artery disease Mother    Healthy Father     Social History Social History   Tobacco Use   Smoking status: Every Day    Packs/day: 0.50    Types: Cigarettes   Smokeless tobacco: Never  Vaping Use   Vaping Use: Never used  Substance Use Topics   Alcohol use: Yes    Alcohol/week: 1.0 standard drink    Types: 1 Glasses of wine per week    Comment: seldom   Drug use: No     Allergies   Patient has no known allergies.   Review of Systems Review of Systems Per HPI  Physical Exam Triage Vital Signs ED Triage Vitals  Enc Vitals Group     BP 02/09/21 1829 (!) 188/102     Pulse Rate 02/09/21 1829 74     Resp 02/09/21 1829 16     Temp 02/09/21 1829 98.2 F (36.8 C)     Temp Source 02/09/21 1829 Oral     SpO2 02/09/21 1829 98 %     Weight --      Height --      Head Circumference --      Peak Flow --      Pain Score 02/09/21 1826 8     Pain Loc --      Pain Edu? --      Excl. in [place]? --    No data found.  Updated Vital Signs BP (!) 188/102 (BP Location: Left Arm)    Pulse 74    Temp 98.2 F (36.8 C) (Oral)    Resp 16    SpO2 98%   Visual Acuity Right Eye Distance:   Left Eye Distance:   Bilateral Distance:    Right Eye Near:   Left Eye Near:    Bilateral Near:     Physical Exam Vitals and nursing note reviewed.  Constitutional:      Appearance: Normal appearance.  HENT:     Head:  Atraumatic.     Right Ear: Tympanic membrane and external ear normal.     Left Ear: Tympanic membrane and external ear normal.     Nose: Rhinorrhea present.     Mouth/Throat:     Mouth: Mucous membranes  are moist.     Pharynx: Posterior oropharyngeal erythema present.  Eyes:     Extraocular Movements: Extraocular movements intact.     Conjunctiva/sclera: Conjunctivae normal.  Cardiovascular:     Rate and Rhythm: Normal rate and regular rhythm.     Heart sounds: Normal heart sounds.  Pulmonary:     Effort: Pulmonary effort is normal.     Breath sounds: Normal breath sounds. No wheezing or rales.     Comments: Localized region of tenderness to palpation over left anterior ribs.  No bony deformity palpable, no edema, bruising to the area Chest:     Chest wall: Tenderness present.  Musculoskeletal:        General: Normal range of motion.     Cervical back: Normal range of motion and neck supple.  Skin:    General: Skin is warm and dry.  Neurological:     Mental Status: She is alert and oriented to person, place, and time.  Psychiatric:        Mood and Affect: Mood normal.        Thought Content: Thought content normal.     UC Treatments / Results  Labs (all labs ordered are listed, but only abnormal results are displayed) Labs Reviewed - No data to display  EKG   Radiology No results found.  Procedures Procedures (including critical care time)  Medications Ordered in UC Medications - No data to display  Initial Impression / Assessment and Plan / UC Course  I have reviewed the triage vital signs and the nursing notes.  Pertinent labs & imaging results that were available during my care of the patient were reviewed by me and considered in my medical decision making (see chart for details).     Suspect bronchitis secondary to uncontrolled seasonal allergies and pulled rib muscle from coughing spells.  We will treat with prednisone, Flexeril, Phenergan DM and start  allergy regimen with Zyrtec and Flonase, continue to monitor for symptomatic benefit.  Return for acutely worsening symptoms.  Final Clinical Impressions(s) / UC Diagnoses   Final diagnoses:  Acute bronchitis, unspecified organism  Seasonal allergic rhinitis due to other allergic trigger  Rib pain on left side   Discharge Instructions   None    ED Prescriptions     Medication Sig Dispense Auth. Provider   predniSONE (DELTASONE) 20 MG tablet Take 2 tablets (40 mg total) by mouth daily with breakfast. 10 tablet Volney American, PA-C   cyclobenzaprine (FLEXERIL) 10 MG tablet Take 0.5-1 tablets (5-10 mg total) by mouth 3 (three) times daily as needed for muscle spasms. Do not drink alcohol or drive while taking this medication.  May cause drowsiness. 10 tablet Volney American, [place]   promethazine-dextromethorphan (PROMETHAZINE-DM) 6.25-15 MG/5ML syrup Take 5 mLs by mouth 4 (four) times daily as needed. 100 mL Volney American, PA-C   fluticasone [facility]) 50 MCG/ACT nasal spray Place 1 spray into both nostrils 2 (two) times daily. 16 g Volney American, [place]   cetirizine (ZYRTEC ALLERGY) 10 MG tablet Take 1 tablet (10 mg total) by mouth daily. 30 tablet Volney American, [place]      PDMP not reviewed this encounter.   Volney American, [place] 02/09/21 1929

## 2021-03-16 ENCOUNTER — Other Ambulatory Visit: Payer: Self-pay

## 2021-03-16 ENCOUNTER — Ambulatory Visit
Admission: RE | Admit: 2021-03-16 | Discharge: 2021-03-16 | Disposition: A | Discharge status: Home / Self Care | Payer: 59 | Source: Ambulatory Visit | Attending: Urgent Care | Admitting: Urgent Care

## 2021-03-16 VITALS — BP 145/92 | HR 90 | Temp 98.3°F | Resp 18 | Ht 65.0 in | Wt 160.0 lb

## 2021-03-16 DIAGNOSIS — R051 Acute cough: Secondary | ICD-10-CM | POA: Diagnosis not present

## 2021-03-16 DIAGNOSIS — B349 Viral infection, unspecified: Secondary | ICD-10-CM

## 2021-03-16 DIAGNOSIS — F17200 Nicotine dependence, unspecified, uncomplicated: Secondary | ICD-10-CM

## 2021-03-16 DIAGNOSIS — Z1152 Encounter for screening for COVID-19: Secondary | ICD-10-CM

## 2021-03-16 MED ORDER — PROMETHAZINE-DM 6.25-15 MG/5ML PO SYRP: 5.0000 mL | ORAL_SOLUTION | Freq: Every evening | ORAL | 0 refills | Status: DC | PRN

## 2021-03-16 MED ORDER — CETIRIZINE HCL 10 MG PO TABS: 10.0000 mg | ORAL_TABLET | Freq: Every day | ORAL | 0 refills | Status: DC

## 2021-03-16 MED ORDER — PREDNISONE 20 MG PO TABS: ORAL_TABLET | ORAL | 0 refills | Status: DC

## 2021-03-16 MED ORDER — BENZONATATE 100 MG PO CAPS: 100.0000 mg | ORAL_CAPSULE | Freq: Three times a day (TID) | ORAL | 0 refills | Status: DC | PRN

## 2021-03-16 NOTE — ED Triage Notes (Signed)
Pt reports cough, earache, body aches, sore throat since Tuesday. Pt reports took home covid test but reports was expired.

## 2021-03-16 NOTE — ED Provider Notes (Signed)
Monica Soto   MRN: [id number] DOB: Apr 29, 1965  Subjective:   Monica Soto is a 56 y.o. female presenting for 2-day history of acute onset right ear pain, body aches, coughing, chest congestion, throat pain.  No chest pain, shortness of breath or wheezing.  Patient has a longstanding history of smoking, does have pack per day now.  No sick contacts to her knowledge.  Has not had COVID that she knows of.  She did do some COVID test at home but was negative, reports that the test itself was expired.  No current facility-administered medications for this encounter.  Current Outpatient Medications:    Aspirin-Acetaminophen-Caffeine (GOODY HEADACHE PO), Take 1 packet by mouth 2 (two) times daily as needed (for pain)., Disp: , Rfl:    benzonatate (TESSALON) 100 MG capsule, Take 1 capsule (100 mg total) by mouth 3 (three) times daily as needed for cough., Disp: 30 capsule, Rfl: 0   cetirizine (ZYRTEC ALLERGY) 10 MG tablet, Take 1 tablet (10 mg total) by mouth daily., Disp: 30 tablet, Rfl: 2   cyclobenzaprine (FLEXERIL) 10 MG tablet, Take 1 tablet (10 mg total) by mouth 3 (three) times daily as needed for muscle spasms., Disp: 10 tablet, Rfl: 0   cyclobenzaprine (FLEXERIL) 10 MG tablet, Take 0.5-1 tablets (5-10 mg total) by mouth 3 (three) times daily as needed for muscle spasms. Do not drink alcohol or drive while taking this medication.  May cause drowsiness., Disp: 10 tablet, Rfl: 0   diphenhydrAMINE (BENADRYL) 25 mg capsule, Take 25 mg by mouth every 6 (six) hours as needed for itching. , Disp: , Rfl:    doxycycline (VIBRA-TABS) 100 MG tablet, Take 1 tablet (100 mg total) by mouth 2 (two) times daily., Disp: 20 tablet, Rfl: 0   fluticasone (FLONASE) 50 MCG/ACT nasal spray, Place 1 spray into both nostrils daily for 14 days., Disp: 16 g, Rfl: 0   fluticasone (FLONASE) 50 MCG/ACT nasal spray, Place 1 spray into both nostrils 2 (two) times daily., Disp: 16 g, Rfl: 2   ibuprofen  (ADVIL) 800 MG tablet, Take 1 tablet (800 mg total) by mouth 3 (three) times daily., Disp: 21 tablet, Rfl: 0   naproxen (NAPROSYN) 500 MG tablet, Take 1 tablet (500 mg total) by mouth 2 (two) times daily with a meal., Disp: 60 tablet, Rfl: 5   ondansetron (ZOFRAN-ODT) 4 MG disintegrating tablet, Take 1 tablet (4 mg total) by mouth every 8 (eight) hours as needed for nausea or vomiting., Disp: 20 tablet, Rfl: 0   phenazopyridine (PYRIDIUM) 100 MG tablet, Take 1 tablet (100 mg total) by mouth 3 (three) times daily as needed for pain., Disp: 15 tablet, Rfl: 0   predniSONE (DELTASONE) 20 MG tablet, Take 2 tablets (40 mg total) by mouth daily with breakfast., Disp: 10 tablet, Rfl: 0   promethazine-dextromethorphan (PROMETHAZINE-DM) 6.25-15 MG/5ML syrup, Take 5 mLs by mouth 4 (four) times daily as needed., Disp: 100 mL, Rfl: 0   triamcinolone cream (KENALOG) 0.1 %, Apply 1 application topically 3 (three) times daily., Disp: 60 g, Rfl: 0   No Known Allergies  Past Medical History:  Diagnosis Date   Depression    Erosive esophagitis 04/26/10   EGD by Dr. Gala Romney, small hiatal hernia   GERD (gastroesophageal reflux disease)    History of stomach ulcers    IBS (irritable bowel syndrome)    Diarrhea predominant   Microscopic colitis 04/26/2010   Colonoscopy by Dr. 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 Romona Curls with a delayed closure   TONSILLECTOMY     TUBAL LIGATION      Family History  Problem Relation Age of Onset   Diabetes Mother    Coronary artery disease Mother    Healthy Father     Social History   Tobacco Use   Smoking status: Every Day    Packs/day: 0.50    Types: Cigarettes   Smokeless tobacco: Never  Vaping Use   Vaping Use: Never used  Substance Use Topics   Alcohol use: Yes     Alcohol/week: 1.0 standard drink    Types: 1 Glasses of wine per week    Comment: seldom   Drug use: No    ROS   Objective:   Vitals: BP (!) 145/92 (BP Location: Right Arm)    Pulse 90    Temp 98.3 F (36.8 C) (Oral)    Resp 18    Ht 5\' 5"  (1.651 m)    Wt 160 lb (72.6 kg)    SpO2 95%    BMI 26.63 kg/m   Physical Exam Constitutional:      General: She is not in acute distress.    Appearance: Normal appearance. She is well-developed and normal weight. She is not ill-appearing, toxic-appearing or diaphoretic.  HENT:     Head: Normocephalic and atraumatic.     Right Ear: Ear canal and external ear normal. No drainage, swelling or tenderness. No middle ear effusion. There is no impacted cerumen. Tympanic membrane is not erythematous.     Left Ear: Ear canal and external ear normal. No drainage, swelling or tenderness.  No middle ear effusion. There is no impacted cerumen. Tympanic membrane is not erythematous.     Ears:     Comments: TMs opacified bilaterally.    Nose: Congestion present. No rhinorrhea.     Mouth/Throat:     Mouth: Mucous membranes are moist. No oral lesions.     Pharynx: No pharyngeal swelling, oropharyngeal exudate, posterior oropharyngeal erythema or uvula swelling.     Tonsils: No tonsillar exudate or tonsillar abscesses.     Comments: Postnasal drainage overlying pharynx. Eyes:     General: No scleral icterus.       Right eye: No discharge.        Left eye: No discharge.     Extraocular Movements: Extraocular movements intact.     Right eye: Normal extraocular motion.     Left eye: Normal extraocular motion.     Conjunctiva/sclera: Conjunctivae normal.  Cardiovascular:     Rate and Rhythm: Normal rate.     Heart sounds: No murmur heard.   No friction rub. No gallop.  Pulmonary:     Effort: Pulmonary effort is normal. No respiratory distress.     Breath sounds: No stridor. No wheezing, rhonchi or rales.  Chest:     Chest wall: No tenderness.   Musculoskeletal:     Cervical back: Normal range of motion and neck supple.  Lymphadenopathy:     Cervical: No cervical adenopathy.  Skin:    General: Skin is warm and dry.  Neurological:     General: No focal deficit present.     Mental Status: She is alert and oriented to person, place, and time.  Psychiatric:  Mood and Affect: Mood normal.        Behavior: Behavior normal.    Assessment and Plan :   PDMP not reviewed this encounter.  1. Acute viral syndrome   2. Encounter for screening for COVID-19   3. Acute cough   4. Smoker    Deferred imaging given clear cardiopulmonary exam, hemodynamically stable vital signs.  In the context of her smoking, offered her an oral prednisone course.  Will manage for viral illness such as viral URI, viral syndrome, viral rhinitis, COVID-19. Recommended supportive care. Offered scripts for symptomatic relief. Testing is pending. Counseled patient on potential for adverse effects with medications prescribed/recommended today, ER and return-to-clinic precautions discussed, patient verbalized understanding.    Jaynee Eagles, [place] 03/16/21 1313

## 2021-03-17 LAB — COVID-19, FLU A+B NAA
Influenza A, NAA: NOT DETECTED
Influenza B, NAA: NOT DETECTED
SARS-CoV-2, NAA: DETECTED — AB

## 2021-05-08 ENCOUNTER — Ambulatory Visit
Admission: RE | Admit: 2021-05-08 | Discharge: 2021-05-08 | Disposition: A | Discharge status: Home / Self Care | Payer: 59 | Source: Ambulatory Visit | Attending: Family Medicine | Admitting: Family Medicine

## 2021-05-08 VITALS — BP 150/93 | HR 83 | Temp 98.2°F

## 2021-05-08 DIAGNOSIS — H6983 Other specified disorders of Eustachian tube, bilateral: Secondary | ICD-10-CM | POA: Diagnosis not present

## 2021-05-08 DIAGNOSIS — J3089 Other allergic rhinitis: Secondary | ICD-10-CM | POA: Diagnosis not present

## 2021-05-08 MED ORDER — FLUTICASONE PROPIONATE 50 MCG/ACT NA SUSP: 1.0000 | Freq: Two times a day (BID) | NASAL | 2 refills | Status: DC

## 2021-05-08 MED ORDER — DEXAMETHASONE SODIUM PHOSPHATE 10 MG/ML IJ SOLN: 10.0000 mg | Freq: Once | INTRAMUSCULAR | Status: DC

## 2021-05-08 MED ORDER — CETIRIZINE HCL 10 MG PO TABS: 10.0000 mg | ORAL_TABLET | Freq: Every day | ORAL | 2 refills | Status: DC

## 2021-05-08 NOTE — ED Provider Notes (Signed)
?[facility] ? ? ? ?CSN: [id number] ?Arrival date & time: 05/08/21  1541 ? ? ?  ? ?History   ?Chief Complaint ?Chief Complaint  ?Patient presents with  ? Nasal Congestion  ?  Ear ache and neck pain - Entered by patient  ? Tinnitus  ? ? ?HPI ?Monica Soto is a 56 y.o. female.  ? ?Presenting today with several day history of progressively worsening runny nose, sinus pressure, ear pain and pressure, ears ringing.  Denies fever, chills, cough, chest pain, shortness of breath, abdominal pain, nausea vomiting or diarrhea.  Has a history of seasonal allergies not currently on allergy regimen consistently.  Tried some Sudafed here and there with minimal relief.  No new sick contacts recently. ? ? ?Past Medical History:  ?Diagnosis Date  ? Depression   ? Erosive esophagitis 04/26/10  ? EGD by Dr. Gala Romney, small hiatal hernia  ? GERD (gastroesophageal reflux disease)   ? History of stomach ulcers   ? IBS (irritable bowel syndrome)   ? Diarrhea predominant  ? Microscopic colitis 04/26/2010  ? Colonoscopy by Dr. Gala Romney, good response with Entocort  ? Tubular adenoma of colon 04/26/10  ? 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 ? ?Patient Active Problem List  ? Diagnosis Date Noted  ? Lymphocytic colitis 05/30/2010  ? GERD 04/17/2010  ? IRRITABLE BOWEL SYNDROME 02/14/2010  ? DIARRHEA 02/14/2010  ? ABDOMINAL PAIN, MILD 02/14/2010  ? ? ?Past Surgical History:  ?Procedure Laterality Date  ? ABDOMINAL HYSTERECTOMY    ? APPENDECTOMY  2/11  ? Dr. Romona Curls with a delayed closure  ? TONSILLECTOMY    ? TUBAL LIGATION    ? ? ?OB History   ?No obstetric history on file. ?  ? ? ? ?Home Medications   ? ?Prior to Admission medications   ?Medication Sig Start Date End Date Taking? Authorizing Provider  ?Aspirin-Acetaminophen-Caffeine (GOODY HEADACHE PO) Take 1 packet by mouth 2 (two) times daily as needed (for pain).    [provider]  ?benzonatate (TESSALON) 100 MG capsule Take 1-2 capsules (100-200 mg total) by  mouth 3 (three) times daily as needed for cough. 03/16/21   Jaynee Eagles, PA-C  ?cetirizine (ZYRTEC ALLERGY) 10 MG tablet Take 1 tablet (10 mg total) by mouth daily. 05/08/21   Volney American, PA-C  ?cyclobenzaprine (FLEXERIL) 10 MG tablet Take 1 tablet (10 mg total) by mouth 3 (three) times daily as needed for muscle spasms. 08/13/19   Montine Circle, PA-C  ?cyclobenzaprine (FLEXERIL) 10 MG tablet Take 0.5-1 tablets (5-10 mg total) by mouth 3 (three) times daily as needed for muscle spasms. Do not drink alcohol or drive while taking this medication.  May cause drowsiness. 02/09/21   Volney American, PA-C  ?diphenhydrAMINE (BENADRYL) 25 mg capsule Take 25 mg by mouth every 6 (six) hours as needed for itching.     [provider]  ?doxycycline (VIBRA-TABS) 100 MG tablet Take 1 tablet (100 mg total) by mouth 2 (two) times daily. 01/10/21   Brunetta Jeans, PA-C  ?fluticasone (FLONASE) 50 MCG/ACT nasal spray Place 1 spray into both nostrils 2 (two) times daily. 02/09/21   Volney American, PA-C  ?fluticasone (FLONASE) 50 MCG/ACT nasal spray Place 1 spray into both nostrils 2 (two) times daily. 05/08/21   Volney American, PA-C  ?ibuprofen (ADVIL) 800 MG tablet Take 1 tablet (800 mg total) by mouth 3 (three) times daily. 08/13/19   Montine Circle, PA-C  ?naproxen (  NAPROSYN) 500 MG tablet Take 1 tablet (500 mg total) by mouth 2 (two) times daily with a meal. 01/11/16   Sanjuana Kava, MD  ?ondansetron (ZOFRAN-ODT) 4 MG disintegrating tablet Take 1 tablet (4 mg total) by mouth every 8 (eight) hours as needed for nausea or vomiting. 01/10/21   Brunetta Jeans, PA-C  ?phenazopyridine (PYRIDIUM) 100 MG tablet Take 1 tablet (100 mg total) by mouth 3 (three) times daily as needed for pain. 10/31/19   Emerson Monte, FNP  ?predniSONE (DELTASONE) 20 MG tablet Take 2 tablets daily with breakfast. 03/16/21   Jaynee Eagles, PA-C  ?promethazine-dextromethorphan (PROMETHAZINE-DM) 6.25-15 MG/5ML syrup Take 5  mLs by mouth at bedtime as needed for cough. 03/16/21   Jaynee Eagles, PA-C  ?triamcinolone cream (KENALOG) 0.1 % Apply 1 application topically 3 (three) times daily. 09/26/20   Scot Jun, FNP  ?albuterol (PROVENTIL HFA;VENTOLIN HFA) 108 (90 Base) MCG/ACT inhaler Inhale 2 puffs into the lungs every 4 (four) hours as needed for wheezing or shortness of breath. ?Patient not taking: Reported on 01/11/2016 04/28/15 09/11/18  Janne Napoleon, NP  ? ? ?Family History ?Family History  ?Problem Relation Age of Onset  ? Diabetes Mother   ? Coronary artery disease Mother   ? Healthy Father   ? ? ?Social History ?Social History  ? ?Tobacco Use  ? Smoking status: Every Day  ?  Packs/day: 0.50  ?  Types: Cigarettes  ? Smokeless tobacco: Never  ?Vaping Use  ? Vaping Use: Never used  ?Substance Use Topics  ? Alcohol use: Yes  ?  Alcohol/week: 1.0 standard drink  ?  Types: 1 Glasses of wine per week  ?  Comment: seldom  ? Drug use: No  ? ? ? ?Allergies   ?Patient has no known allergies. ? ? ?Review of Systems ?Review of Systems ?Per HPI ? ?Physical Exam ?Triage Vital Signs ?ED Triage Vitals  ?Enc Vitals Group  ?   BP 05/08/21 1621 (!) 150/93  ?   Pulse Rate 05/08/21 1621 83  ?   Resp --   ?   Temp 05/08/21 1621 98.2 ?F (36.8 ?C)  ?   Temp src --   ?   SpO2 05/08/21 1621 94 %  ?   Weight --   ?   Height --   ?   Head Circumference --   ?   Peak Flow --   ?   Pain Score 05/08/21 1620 0  ?   Pain Loc --   ?   Pain Edu? --   ?   Excl. in Page? --   ? ?No data found. ? ?Updated Vital Signs ?BP (!) 150/93   Pulse 83   Temp 98.2 ?F (36.8 ?C)   SpO2 94%  ? ?Visual Acuity ?Right Eye Distance:   ?Left Eye Distance:   ?Bilateral Distance:   ? ?Right Eye Near:   ?Left Eye Near:    ?Bilateral Near:    ? ?Physical Exam ?Vitals and nursing note reviewed.  ?Constitutional:   ?   Appearance: Normal appearance. She is not ill-appearing.  ?HENT:  ?   Head: Atraumatic.  ?   Ears:  ?   Comments: Bilateral middle ear effusion ?   Nose: Rhinorrhea present.   ?   Mouth/Throat:  ?   Pharynx: Posterior oropharyngeal erythema present.  ?Eyes:  ?   Extraocular Movements: Extraocular movements intact.  ?   Conjunctiva/sclera: Conjunctivae normal.  ?Cardiovascular:  ?   Rate  and Rhythm: Normal rate and regular rhythm.  ?   Heart sounds: Normal heart sounds.  ?Pulmonary:  ?   Effort: Pulmonary effort is normal.  ?   Breath sounds: Normal breath sounds. No wheezing or rales.  ?Musculoskeletal:     ?   General: Normal range of motion.  ?   Cervical back: Normal range of motion and neck supple.  ?Skin: ?   General: Skin is warm and dry.  ?Neurological:  ?   Mental Status: She is alert and oriented to person, place, and time.  ?   Motor: No weakness.  ?   Gait: Gait normal.  ?Psychiatric:     ?   Mood and Affect: Mood normal.     ?   Thought Content: Thought content normal.     ?   Judgment: Judgment normal.  ? ?UC Treatments / Results  ?Labs ?(all labs ordered are listed, but only abnormal results are displayed) ?Labs Reviewed - No data to display ? ?EKG ? ? ?Radiology ?No results found. ? ?Procedures ?Procedures (including critical care time) ? ?Medications Ordered in UC ?Medications  ?dexamethasone (DECADRON) injection 10 mg (has no administration in time range)  ? ? ?Initial Impression / Assessment and Plan / UC Course  ?I have reviewed the triage vital signs and the nursing notes. ? ?Pertinent labs & imaging results that were available during my care of the patient were reviewed by me and considered in my medical decision making (see chart for details). ? ?  ? ?Treat with IM Decadron, restart consistent allergy regimen with Zyrtec and Flonase, supportive medications and home care reviewed.  Return for any acutely worsening symptoms.  Note given. ? ?Final Clinical Impressions(s) / UC Diagnoses  ? ?Final diagnoses:  ?Seasonal allergic rhinitis due to other allergic trigger  ?Dysfunction of both eustachian tubes  ? ?Discharge Instructions   ?None ?  ? ?ED Prescriptions   ? ?  Medication Sig Dispense Auth. Provider  ? cetirizine (ZYRTEC ALLERGY) 10 MG tablet Take 1 tablet (10 mg total) by mouth daily. 30 tablet Volney American, [place]  ? fluticasone (FLONASE) 50 MCG/ACT nasal sp

## 2021-05-08 NOTE — ED Triage Notes (Signed)
Pt presents with nasal congestion and ears ringing  since Friday. Pt had covid in February  and has felt unwell since  ?

## 2021-07-26 ENCOUNTER — Telehealth: Payer: 59 | Admitting: Nurse Practitioner

## 2021-07-26 DIAGNOSIS — J069 Acute upper respiratory infection, unspecified: Secondary | ICD-10-CM | POA: Diagnosis not present

## 2021-07-26 MED ORDER — FLUTICASONE PROPIONATE 50 MCG/ACT NA SUSP: 2.0000 | Freq: Every day | NASAL | 6 refills | Status: DC

## 2021-07-26 NOTE — Progress Notes (Signed)
E-Visit for Sinus Problems  We are sorry that you are not feeling well.  Here is how we plan to help!  Based on what you have shared with me it looks like you have sinusitis.  Sinusitis is inflammation and infection in the sinus cavities of the head.  Based on your presentation I believe you most likely have Acute Viral Sinusitis.This is an infection most likely caused by a virus. There is not specific treatment for viral sinusitis other than to help you with the symptoms until the infection runs its course.  You may use an oral decongestant such as Mucinex D or if you have glaucoma or high blood pressure use plain Mucinex. Saline nasal spray help and can safely be used as often as needed for congestion, I have prescribed: Fluticasone nasal spray two sprays in each nostril once a day  Some authorities believe that zinc sprays or the use of Echinacea may shorten the course of your symptoms.  Sinus infections are not as easily transmitted as other respiratory infection, however we still recommend that you avoid close contact with loved ones, especially the very young and elderly.  Remember to wash your hands thoroughly throughout the day as this is the number one way to prevent the spread of infection!  Home Care: Only take medications as instructed by your medical team. Do not take these medications with alcohol. A steam or ultrasonic humidifier can help congestion.  You can place a towel over your head and breathe in the steam from hot water coming from a faucet. Avoid close contacts especially the very young and the elderly. Cover your mouth when you cough or sneeze. Always remember to wash your hands.  Get Help Right Away If: You develop worsening fever or sinus pain. You develop a severe head ache or visual changes. Your symptoms persist after you have completed your treatment plan.  Make sure you Understand these instructions. Will watch your condition. Will get help right away if you  are not doing well or get worse.   Thank you for choosing an e-visit.  Your e-visit answers were reviewed by a board certified advanced clinical practitioner to complete your personal care plan. Depending upon the condition, your plan could have included both over the counter or prescription medications.  Please review your pharmacy choice. Make sure the pharmacy is open so you can pick up prescription now. If there is a problem, you may contact your provider through [employer] and have the prescription routed to another pharmacy.  Your safety is important to [place]. If you have drug allergies check your prescription carefully.   For the next 24 hours you can use MyChart to ask questions about today's visit, request a non-urgent call back, or ask for a work or school excuse. You will get an email in the next two days asking about your experience. I hope that your e-visit has been valuable and will speed your recovery.  Meds ordered this encounter  Medications   fluticasone (FLONASE) 50 MCG/ACT nasal spray    Sig: Place 2 sprays into both nostrils daily.    Dispense:  16 g    Refill:  6     I spent approximately 5 minutes reviewing the patient's history, current symptoms and coordinating their plan of care today.

## 2021-12-18 ENCOUNTER — Telehealth: Payer: 59 | Admitting: Family Medicine

## 2021-12-18 DIAGNOSIS — M5441 Lumbago with sciatica, right side: Secondary | ICD-10-CM | POA: Diagnosis not present

## 2021-12-18 MED ORDER — NAPROXEN 500 MG PO TABS
500.0000 mg | ORAL_TABLET | Freq: Two times a day (BID) | ORAL | 0 refills | Status: DC
Start: 2021-12-18 — End: 2022-07-25

## 2021-12-18 MED ORDER — CYCLOBENZAPRINE HCL 10 MG PO TABS: 10.0000 mg | ORAL_TABLET | Freq: Three times a day (TID) | ORAL | 0 refills | Status: DC | PRN

## 2021-12-18 NOTE — Progress Notes (Signed)

## 2021-12-25 ENCOUNTER — Ambulatory Visit
Admission: RE | Admit: 2021-12-25 | Discharge: 2021-12-25 | Disposition: A | Discharge status: Home / Self Care | Payer: 59 | Source: Ambulatory Visit | Attending: Internal Medicine | Admitting: Internal Medicine

## 2021-12-25 VITALS — BP 143/80 | HR 112 | Temp 100.0°F | Resp 20

## 2021-12-25 DIAGNOSIS — J069 Acute upper respiratory infection, unspecified: Secondary | ICD-10-CM | POA: Insufficient documentation

## 2021-12-25 DIAGNOSIS — Z1152 Encounter for screening for COVID-19: Secondary | ICD-10-CM | POA: Diagnosis present

## 2021-12-25 LAB — POCT URINALYSIS DIP (MANUAL ENTRY)
Bilirubin, UA: NEGATIVE
Glucose, UA: NEGATIVE mg/dL
Ketones, POC UA: NEGATIVE mg/dL
Leukocytes, UA: NEGATIVE
Nitrite, UA: NEGATIVE
Protein Ur, POC: NEGATIVE mg/dL
Spec Grav, UA: 1.015 (ref 1.010–1.025)
Urobilinogen, UA: 1 E.U./dL
pH, UA: 6 (ref 5.0–8.0)

## 2021-12-25 LAB — RESP PANEL BY RT-PCR (FLU A&B, COVID) ARPGX2
Influenza A by PCR: NEGATIVE
Influenza B by PCR: NEGATIVE
SARS Coronavirus 2 by RT PCR: NEGATIVE

## 2021-12-25 MED ORDER — PROMETHAZINE-DM 6.25-15 MG/5ML PO SYRP: 5.0000 mL | ORAL_SOLUTION | Freq: Every evening | ORAL | 0 refills | Status: DC | PRN

## 2021-12-25 MED ORDER — BENZONATATE 100 MG PO CAPS: 100.0000 mg | ORAL_CAPSULE | Freq: Three times a day (TID) | ORAL | 0 refills | Status: DC | PRN

## 2021-12-25 NOTE — Discharge Instructions (Signed)
You have a viral upper respiratory infection.  Symptoms should improve over the next week to 10 days.  If you develop chest pain or shortness of breath, go to the emergency room.  We have tested you today for COVID-19 and influenza.  You will see the results in Mychart and we will call you with positive results.    Please stay home and isolate until you are aware of the results.    Some things that can make you feel better are: - Increased rest - Increasing fluid with water/sugar free electrolytes - Acetaminophen and ibuprofen as needed for fever/pain - Salt water gargling, chloraseptic spray and throat lozenges - OTC guaifenesin (Mucinex) 600 mg twice daily - Saline sinus flushes or a neti pot - Humidifying the air -Tessalon Perles during the day as needed for dry cough and cough syrup at nighttime as needed for dry cough

## 2021-12-25 NOTE — ED Triage Notes (Signed)
Pt reports on Saturday coughing, headache, and low grade fever and body aches. Pt went and got a test from [employer] [facility] and they said it was negative. Complains of right ear pain and nausea.    Complains of Urine is dark yellow with lower back pain and painful urination which started yesterday.    Took tylnol and motrin on and off.

## 2021-12-25 NOTE — ED Provider Notes (Signed)
[facility]    CSN: [id number] Arrival date & time: 12/25/21  1512      History   Chief Complaint Chief Complaint  Patient presents with   Influenza    Entered by patient    HPI Monica Soto is a 56 y.o. female.   Patient presents with symptoms that began approximately 2 to 3 days ago.  She reports body aches, fever, mostly dry cough with small amount of productive mucus, nasal congestion, postnasal drainage, sore throat and headache, right ear pain without drainage, bilateral ear pressure, nausea, decreased appetite, altered taste, and fatigue.  Also reports dark urination and urinary urgency for the past couple of days.  She denies shortness of breath, chest pain, chest congestion, runny nose, abdominal pain, vomiting, diarrhea, dysuria, new urinary incontinence, hematuria, vaginal discharge.  She endorses a little bit of low back pain across the entire lower part of her back.  Patient reports she feels like she has the flu or COVID-19.  She is also concerned about a UTI.     Past Medical History:  Diagnosis Date   Depression    Erosive esophagitis 04/26/10   EGD by Dr. Gala Romney, small hiatal hernia   GERD (gastroesophageal reflux disease)    History of stomach ulcers    IBS (irritable bowel syndrome)    Diarrhea predominant   Microscopic colitis 04/26/2010   Colonoscopy by Dr. Gala Romney, good response with Entocort   Tubular adenoma of colon 04/26/10   Junction of descending and sigmoid 40 CM from anus    Patient Active Problem List   Diagnosis Date Noted   Lymphocytic colitis 05/30/2010   GERD 04/17/2010   IRRITABLE BOWEL SYNDROME 02/14/2010   DIARRHEA 02/14/2010   ABDOMINAL PAIN, MILD 02/14/2010    Past Surgical History:  Procedure Laterality Date   ABDOMINAL HYSTERECTOMY     APPENDECTOMY  2/11   Dr. Romona Curls with a delayed closure   TONSILLECTOMY     TUBAL LIGATION      OB History   No obstetric history on file.      Home Medications     Prior to Admission medications   Medication Sig Start Date End Date Taking? Authorizing Provider  Aspirin-Acetaminophen-Caffeine (GOODY HEADACHE PO) Take 1 packet by mouth 2 (two) times daily as needed (for pain).    [provider]  benzonatate (TESSALON) 100 MG capsule Take 1-2 capsules (100-200 mg total) by mouth 3 (three) times daily as needed for cough. Do not take with alcohol or while driving or operating heavy machinery.  May cause drowsiness. 12/25/21   Eulogio Bear, NP  cetirizine (ZYRTEC ALLERGY) 10 MG tablet Take 1 tablet (10 mg total) by mouth daily. 05/08/21   Volney American, PA-C  cyclobenzaprine (FLEXERIL) 10 MG tablet Take 1 tablet (10 mg total) by mouth 3 (three) times daily as needed for muscle spasms. 12/18/21   Perlie Mayo, NP  diphenhydrAMINE (BENADRYL) 25 mg capsule Take 25 mg by mouth every 6 (six) hours as needed for itching.     [provider]  fluticasone (FLONASE) 50 MCG/ACT nasal spray Place 2 sprays into both nostrils daily. 07/26/21   Apolonio Schneiders, FNP  naproxen (NAPROSYN) 500 MG tablet Take 1 tablet (500 mg total) by mouth 2 (two) times daily with a meal. 12/18/21   Perlie Mayo, NP  ondansetron (ZOFRAN-ODT) 4 MG disintegrating tablet Take 1 tablet (4 mg total) by mouth every 8 (eight) hours as needed for nausea or  vomiting. 01/10/21   Brunetta Jeans, PA-C  promethazine-dextromethorphan (PROMETHAZINE-DM) 6.25-15 MG/5ML syrup Take 5 mLs by mouth at bedtime as needed for cough. Do not take with alcohol or while driving or operating heavy machinery.  May cause drowsiness. 12/25/21   Eulogio Bear, NP  albuterol (PROVENTIL HFA;VENTOLIN HFA) 108 (90 Base) MCG/ACT inhaler Inhale 2 puffs into the lungs every 4 (four) hours as needed for wheezing or shortness of breath. Patient not taking: Reported on 01/11/2016 04/28/15 09/11/18  Janne Napoleon, NP    Family History Family History  Problem Relation Age of Onset   Diabetes Mother     Coronary artery disease Mother    Healthy Father     Social History Social History   Tobacco Use   Smoking status: Every Day    Packs/day: 0.50    Types: Cigarettes   Smokeless tobacco: Never  Vaping Use   Vaping Use: Never used  Substance Use Topics   Alcohol use: Yes    Alcohol/week: 1.0 standard drink of alcohol    Types: 1 Glasses of wine per week    Comment: seldom   Drug use: No     Allergies   Patient has no known allergies.   Review of Systems Review of Systems Per HPI  Physical Exam Triage Vital Signs ED Triage Vitals  Enc Vitals Group     BP 12/25/21 1559 (!) 143/80     Pulse Rate 12/25/21 1559 (!) 112     Resp 12/25/21 1559 20     Temp 12/25/21 1559 100 F (37.8 C)     Temp Source 12/25/21 1559 Oral     SpO2 12/25/21 1559 95 %     Weight --      Height --      Head Circumference --      Peak Flow --      Pain Score 12/25/21 1602 6     Pain Loc --      Pain Edu? --      Excl. in [place]? --    No data found.  Updated Vital Signs BP (!) 143/80 (BP Location: Right Arm)   Pulse (!) 112   Temp 100 F (37.8 C) (Oral)   Resp 20   SpO2 95%   Visual Acuity Right Eye Distance:   Left Eye Distance:   Bilateral Distance:    Right Eye Near:   Left Eye Near:    Bilateral Near:     Physical Exam Vitals and nursing note reviewed.  Constitutional:      General: She is not in acute distress.    Appearance: Normal appearance. She is not ill-appearing or toxic-appearing.  HENT:     Head: Normocephalic and atraumatic.     Right Ear: Tympanic membrane, ear canal and external ear normal.     Left Ear: Tympanic membrane, ear canal and external ear normal.     Nose: No congestion or rhinorrhea.     Mouth/Throat:     Mouth: Mucous membranes are moist.     Pharynx: Oropharynx is clear. Posterior oropharyngeal erythema present. No oropharyngeal exudate.  Eyes:     General: No scleral icterus.    Extraocular Movements: Extraocular movements intact.   Cardiovascular:     Rate and Rhythm: Normal rate and regular rhythm.  Pulmonary:     Effort: Pulmonary effort is normal. No respiratory distress.     Breath sounds: Normal breath sounds. No wheezing, rhonchi or rales.  Abdominal:  General: Abdomen is flat. Bowel sounds are normal. There is no distension.     Palpations: Abdomen is soft.     Tenderness: There is no abdominal tenderness. There is no right CVA tenderness, left CVA tenderness or guarding.  Musculoskeletal:     Cervical back: Normal range of motion and neck supple.       Back:     Comments: Tender to palpation in approximately area marked.  No erythema, swelling, bruising.   Lymphadenopathy:     Cervical: No cervical adenopathy.  Skin:    General: Skin is warm and dry.     Coloration: Skin is not jaundiced or pale.     Findings: No erythema or rash.  Neurological:     Mental Status: She is alert and oriented to person, place, and time.  Psychiatric:        Behavior: Behavior is cooperative.      UC Treatments / Results  Labs (all labs ordered are listed, but only abnormal results are displayed) Labs Reviewed  POCT URINALYSIS DIP (MANUAL ENTRY) - Abnormal; Notable for the following components:      Result Value   Blood, UA large (*)    All other components within normal limits  RESP PANEL BY RT-PCR (FLU A&B, COVID) [id number]    EKG   Radiology No results found.  Procedures Procedures (including critical care time)  Medications Ordered in UC Medications - No data to display  Initial Impression / Assessment and Plan / UC Course  I have reviewed the triage vital signs and the nursing notes.  Pertinent labs & imaging results that were available during my care of the patient were reviewed by me and considered in my medical decision making (see chart for details).   Patient is well-appearing, normotensive, not tachypneic, oxygenating well on room air.  She is mildly tachycardic and has a low-grade fever  today which are both related likely to acute upper respiratory infection.  Encounter for screening for COVID-19 Viral URI with cough Urinalysis today shows large RBCs-in reviewing her history, it appears she has chronic hematuria and discussed this with the patient I have low suspicion for nephrolithiasis given no CVA tenderness Suspect viral etiology COVID-19, influenza testing obtained Patient is a good candidate for Tamiflu or molnupiravir if she tests positive Supportive care discussed -start cough suppressants ER and return precautions discussed  The patient was given the opportunity to ask questions.  All questions answered to their satisfaction.  The patient is in agreement to this plan.    Final Clinical Impressions(s) / UC Diagnoses   Final diagnoses:  Encounter for screening for COVID-19  Viral URI with cough     Discharge Instructions      You have a viral upper respiratory infection.  Symptoms should improve over the next week to 10 days.  If you develop chest pain or shortness of breath, go to the emergency room.  We have tested you today for COVID-19 and influenza.  You will see the results in Mychart and we will call you with positive results.    Please stay home and isolate until you are aware of the results.    Some things that can make you feel better are: - Increased rest - Increasing fluid with water/sugar free electrolytes - Acetaminophen and ibuprofen as needed for fever/pain - Salt water gargling, chloraseptic spray and throat lozenges - OTC guaifenesin (Mucinex) 600 mg twice daily - Saline sinus flushes or a neti pot - Humidifying the  air -Tessalon Perles during the day as needed for dry cough and cough syrup at nighttime as needed for dry cough     ED Prescriptions     Medication Sig Dispense Auth. Provider   promethazine-dextromethorphan (PROMETHAZINE-DM) 6.25-15 MG/5ML syrup Take 5 mLs by mouth at bedtime as needed for cough. Do not take with  alcohol or while driving or operating heavy machinery.  May cause drowsiness. 100 mL Noemi Chapel A, NP   benzonatate (TESSALON) 100 MG capsule Take 1-2 capsules (100-200 mg total) by mouth 3 (three) times daily as needed for cough. Do not take with alcohol or while driving or operating heavy machinery.  May cause drowsiness. 60 capsule Eulogio Bear, NP      PDMP not reviewed this encounter.   Eulogio Bear, NP 12/25/21 1723

## 2022-06-06 ENCOUNTER — Other Ambulatory Visit: Payer: Self-pay | Admitting: Obstetrics and Gynecology

## 2022-06-06 DIAGNOSIS — N63 Unspecified lump in unspecified breast: Secondary | ICD-10-CM

## 2022-06-19 ENCOUNTER — Other Ambulatory Visit: Payer: Self-pay | Admitting: Obstetrics and Gynecology

## 2022-06-19 ENCOUNTER — Ambulatory Visit
Admission: RE | Admit: 2022-06-19 | Discharge: 2022-06-19 | Disposition: A | Discharge status: Home / Self Care | Payer: 59 | Source: Ambulatory Visit | Attending: Obstetrics and Gynecology | Admitting: Obstetrics and Gynecology

## 2022-06-19 DIAGNOSIS — N63 Unspecified lump in unspecified breast: Secondary | ICD-10-CM

## 2022-06-21 ENCOUNTER — Other Ambulatory Visit: Payer: 59

## 2022-06-22 ENCOUNTER — Ambulatory Visit
Admission: RE | Admit: 2022-06-22 | Discharge: 2022-06-22 | Disposition: A | Discharge status: Home / Self Care | Payer: 59 | Source: Ambulatory Visit | Attending: Obstetrics and Gynecology | Admitting: Obstetrics and Gynecology

## 2022-06-22 ENCOUNTER — Other Ambulatory Visit: Payer: Self-pay | Admitting: Obstetrics and Gynecology

## 2022-06-22 DIAGNOSIS — N63 Unspecified lump in unspecified breast: Secondary | ICD-10-CM

## 2022-06-22 DIAGNOSIS — N6310 Unspecified lump in the right breast, unspecified quadrant: Secondary | ICD-10-CM

## 2022-06-22 HISTORY — PX: BREAST BIOPSY: SHX20

## 2022-06-27 ENCOUNTER — Telehealth: Payer: Self-pay | Admitting: Hematology and Oncology

## 2022-06-27 NOTE — Telephone Encounter (Signed)
Spoke to patient to confirm upcoming morning [facility] clinic appointment  on 5/29, paperwork will be sent via mail.   Gave location and time, also informed patient that the surgeon's office would be calling as well to get information from them similar to the packet that they will be receiving so make sure to do both.  Reminded patient that all providers will be coming to the clinic to see them HERE and if they had any questions to not hesitate to reach back out to myself or their navigators.

## 2022-07-03 ENCOUNTER — Encounter: Payer: Self-pay | Admitting: *Deleted

## 2022-07-03 DIAGNOSIS — Z170 Estrogen receptor positive status [ER+]: Secondary | ICD-10-CM | POA: Insufficient documentation

## 2022-07-03 NOTE — Progress Notes (Signed)
Radiation Oncology         [phone]) [phone] ________________________________  Multidisciplinary Breast Oncology Clinic [facility]) Initial Outpatient Consultation  Name: Monica Soto MRN: [id number]  Date: 07/04/2022  DOB: 1965/11/21  WU:[id number], Jonny Ruiz, MD  Manus Rudd, MD   REFERRING PHYSICIAN: Manus Rudd, MD  DIAGNOSIS: There were no encounter diagnoses.  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  HISTORY OF PRESENT ILLNESS::Monica Soto is a 57 y.o. female who is presenting to the office today for evaluation of her newly diagnosed breast cancer. She is accompanied by ***. She is doing well overall.   She presented with a palpable lump in the right breast. She subsequently underwent bilateral diagnostic mammography with tomography and bilateral breast ultrasonography at [facility] on 06/19/22 showing: a large mass in the upper outer right breast measuring approximately 8-9 cm, as well as 2 abnormal right axillary lymph nodes and a third borderline lymph node. Imaging also showed sin thickening along the medial aspect of the left breast which may be an extension from skin thickening in the right breast. No other abnormalities were otherwise appreciated in the left breast.   Biopsy if the upper outer right breast on 06/22/22 showed: grade 2 invasive ductal carcinoma measuring 9 mm in the greatest linear extent of the sample. Prognostic indicators significant for: estrogen receptor, 90% positive and progesterone receptor, 70% positive, both with strong staining intensity. Proliferation marker Ki67 at 20%. HER2 negative.  Two right axillary lymph node biopsies were also performed and showed findings consistent with metastatic carcinoma.    Menarche: *** years old Age at first live birth: *** years old GP: *** LMP: *** Contraceptive: *** HRT: ***   The patient was referred today  for presentation in the multidisciplinary conference.  Radiology studies and pathology slides were presented there for review and discussion of treatment options.  A consensus was discussed regarding potential next steps.  PREVIOUS RADIATION THERAPY: {EXAM; YES/NO:19492::"No"}  PAST MEDICAL HISTORY:  Past Medical History:  Diagnosis Date   Depression    Erosive esophagitis 04/26/10   EGD by Dr. Jena Gauss, small hiatal hernia   GERD (gastroesophageal reflux disease)    History of stomach ulcers    IBS (irritable bowel syndrome)    Diarrhea predominant   Microscopic colitis 04/26/2010   Colonoscopy by Dr. Jena Gauss, good response with Entocort   Tubular adenoma of colon 04/26/10   Junction of descending and sigmoid 40 CM from anus    PAST SURGICAL HISTORY: Past Surgical History:  Procedure Laterality Date   ABDOMINAL HYSTERECTOMY     APPENDECTOMY  2/11   Dr. Malvin Johns with a delayed closure   BREAST BIOPSY Right 06/22/2022   [place] RT BREAST BX W LOC DEV 1ST LESION IMG BX SPEC US GUIDE 06/22/2022 [facility]   TONSILLECTOMY     TUBAL LIGATION      FAMILY HISTORY:  Family History  Problem Relation Age of Onset   Diabetes Mother    Coronary artery disease Mother    Healthy Father     SOCIAL HISTORY:  Social History   Socioeconomic History   Marital status: Married    Spouse name: Not on file   Number of children: Not on file   Years of education: Not on file   Highest education level: Not on file  Occupational History    Employer: [employer]  Comment: Third shift  Tobacco Use   Smoking status: Every Day    Packs/day: .5    Types: Cigarettes   Smokeless tobacco: Never  Vaping Use   Vaping Use: Never used  Substance and Sexual Activity   Alcohol use: Yes    Alcohol/week: 1.0 standard drink of alcohol    Types: 1 Glasses of wine per week    Comment: seldom   Drug use: No   Sexual activity: Yes    Birth control/protection: Surgical  Other Topics Concern   Not on  file  Social History Narrative   Not on file   Social Determinants of Health   Financial Resource Strain: Not on file  Food Insecurity: Not on file  Transportation Needs: Not on file  Physical Activity: Not on file  Stress: Not on file  Social Connections: Not on file    ALLERGIES: No Known Allergies  MEDICATIONS:  Current Outpatient Medications  Medication Sig Dispense Refill   Aspirin-Acetaminophen-Caffeine (GOODY HEADACHE PO) Take 1 packet by mouth 2 (two) times daily as needed (for pain).     benzonatate (TESSALON) 100 MG capsule Take 1-2 capsules (100-200 mg total) by mouth 3 (three) times daily as needed for cough. Do not take with alcohol or while driving or operating heavy machinery.  May cause drowsiness. 60 capsule 0   cetirizine (ZYRTEC ALLERGY) 10 MG tablet Take 1 tablet (10 mg total) by mouth daily. 30 tablet 2   cyclobenzaprine (FLEXERIL) 10 MG tablet Take 1 tablet (10 mg total) by mouth 3 (three) times daily as needed for muscle spasms. 30 tablet 0   diphenhydrAMINE (BENADRYL) 25 mg capsule Take 25 mg by mouth every 6 (six) hours as needed for itching.      fluticasone (FLONASE) 50 MCG/ACT nasal spray Place 2 sprays into both nostrils daily. 16 g 6   naproxen (NAPROSYN) 500 MG tablet Take 1 tablet (500 mg total) by mouth 2 (two) times daily with a meal. 30 tablet 0   ondansetron (ZOFRAN-ODT) 4 MG disintegrating tablet Take 1 tablet (4 mg total) by mouth every 8 (eight) hours as needed for nausea or vomiting. 20 tablet 0   promethazine-dextromethorphan (PROMETHAZINE-DM) 6.25-15 MG/5ML syrup Take 5 mLs by mouth at bedtime as needed for cough. Do not take with alcohol or while driving or operating heavy machinery.  May cause drowsiness. 100 mL 0   No current facility-administered medications for this encounter.    REVIEW OF SYSTEMS: A 10+ POINT REVIEW OF SYSTEMS WAS OBTAINED including neurology, dermatology, psychiatry, cardiac, respiratory, lymph, extremities, GI, GU,  musculoskeletal, constitutional, reproductive, HEENT. On the provided form, she reports ***. She denies *** and any other symptoms.    PHYSICAL EXAM:  vitals were not taken for this visit.  {may need to copy over vitals} Lungs are clear to auscultation bilaterally. Heart has regular rate and rhythm. No palpable cervical, supraclavicular, or axillary adenopathy. Abdomen soft, non-tender, normal bowel sounds. Breast: *** breast with no palpable mass, nipple discharge, or bleeding. *** breast with ***.   KPS = ***  100 - Normal; no complaints; no evidence of disease. 90   - Able to carry on normal activity; minor signs or symptoms of disease. 80   - Normal activity with effort; some signs or symptoms of disease. 28   - Cares for self; unable to carry on normal activity or to do active work. 60   - Requires occasional assistance, but is able to care for most of his personal  needs. 50   - Requires considerable assistance and frequent medical care. 40   - Disabled; requires special care and assistance. 30   - Severely disabled; hospital admission is indicated although death not imminent. 20   - Very sick; hospital admission necessary; active supportive treatment necessary. 10   - Moribund; fatal processes progressing rapidly. 0     - Dead  Karnofsky DA, Abelmann WH, Craver LS and Burchenal JH [phone]) The use of the nitrogen mustards in the palliative treatment of carcinoma: with particular reference to bronchogenic carcinoma Cancer 1 [id number]  LABORATORY DATA:  Lab Results  Component Value Date   WBC 7.5 03/29/2015   HGB 14.1 03/29/2015   HCT 40.8 03/29/2015   MCV 90.1 03/29/2015   PLT 234 03/29/2015   Lab Results  Component Value Date   NA 141 03/29/2015   K 3.8 03/29/2015   CL 103 03/29/2015   CO2 27 03/29/2015   Lab Results  Component Value Date   ALT 24 03/29/2015   AST 17 03/29/2015   ALKPHOS 66 03/29/2015   BILITOT 0.5 03/29/2015    PULMONARY FUNCTION TEST:   Review  Flowsheet        No data to display          RADIOGRAPHY: [place] RT BREAST BX W LOC DEV 1ST LESION IMG BX SPEC US GUIDE  Addendum Date: 06/26/2022   ADDENDUM REPORT: 06/26/2022 11:04 ADDENDUM: PATHOLOGY revealed: Site 1. Breast, RIGHT, needle core biopsy, upper outer, 3 cm fn, venus clip INVASIVE MODERATELY DIFFERENTIATED DUCTAL ADENOCARCINOMA, GRADE 2 (3+3+1) TUBULE FORMATION: SCORE 3 NUCLEAR PLEOMORPHISM: SCORE 3 MITOTIC COUNT: SCORE 1 TOTAL SCORE: 7 OVERALL GRADE GRADE 2 (7/9) MICROCALCIFICATIONS PRESENT WITHIN PERITUMORAL STROMA NEGATIVE FOR ANGIOLYMPHATIC INVASION TUMOR MEASURES 9 MM IN GREATEST LINEAR EXTENT Pathology results are CONCORDANT with imaging findings, per Dr. Frederico Hamman. PATHOLOGY revealed: Site 2. Lymph node, needle/core biopsy, low anterior RIGHT axilla, coil clip MODERATELY DIFFERENTIATED DUCTAL ADENOCARCINOMA, GRADE 2 (3+3+1) WITHIN DESMOPLASTIC AND FIBROTIC STROMA SCANT LYMPHOID STROMA Pathology results are CONCORDANT with imaging findings, per Dr. Frederico Hamman. PATHOLOGY revealed: Site 3. Lymph node, needle/core biopsy, RIGHT axillary, hydromark spiral clip COMPATIBLE WITH A METASTATIC MODERATELY DIFFERENTIATED DUCTAL ADENOCARCINOMA SCANT LYMPHOID STROMA Pathology results are CONCORDANT with imaging findings, per Dr. Frederico Hamman. Pathology results and recommendations below were discussed with patient by telephone on 06/25/2022. Patient reported biopsy site within normal limits with slight tenderness at the site. Post biopsy care instructions were reviewed, questions were answered and my direct phone number was provided to patient. Patient was instructed to call [facility] if any concerns or questions arise related to the biopsy. The patient was referred to the [facility] at [facility] with appointment on 07/04/2022. Pathology results reported by Lynett Grimes, RN on 06/25/2022. Electronically  Signed   By: Frederico Hamman M.D.   On: 06/26/2022 11:04   Result Date: 06/26/2022 CLINICAL DATA:  57 year old female presenting for ultrasound-guided biopsy of a right breast mass and right axillary lymph node. EXAM: ULTRASOUND GUIDED RIGHT BREAST CORE NEEDLE BIOPSY COMPARISON:  Previous exam(s). PROCEDURE: I met with the patient and we discussed the procedure of ultrasound-guided biopsy, including benefits and alternatives. We discussed the high likelihood of a successful procedure. We discussed the risks of the procedure, including infection, bleeding, tissue injury, clip migration, and inadequate sampling. Informed written consent was given. The usual time-out protocol was performed immediately prior to the procedure. Lesion quadrant: Upper  outer quadrant Using sterile technique and 1% Lidocaine as local anesthetic, under direct ultrasound visualization, a 14 gauge spring-loaded device was used to perform biopsy of a mass in the upper-outer right breast, 4 cm from the nipple using an inferolateral approach. At the conclusion of the procedure a venus shaped tissue marker clip was deployed into the biopsy cavity. Lesion quadrant: Right axilla Upon scanning in preparation for biopsy, an additional mass was found in the low anterior right axilla measuring 1.6 x 1.4 x 0.9 cm. This mass could represent an additional site of primary malignancy versus a completely replaced metastatic lymph node. Using ChloraPrep, sterile technique and 1% Lidocaine with epinephrine as local anesthetic, under direct ultrasound visualization, a 14 gauge spring-loaded device was used to perform biopsy of a mass in the low anterior right axilla using an inferolateral approach. At the conclusion of the procedure a coil shaped tissue marker clip was deployed into the biopsy cavity. Lesion quadrant: Right axilla Using ChloraPrep, sterile technique and 1% Lidocaine with epinephrine as local anesthetic, under direct ultrasound visualization,  a 14 gauge spring-loaded device was used to perform biopsy of a right axillary lymph node using an inferior approach. At the conclusion of the procedure a HydroMARK spiral shaped tissue marker clip was deployed into the biopsy cavity. Follow up 2 view mammogram was performed and dictated separately. IMPRESSION: 1. Ultrasound guided biopsy of a mass in the upper-outer quadrant of the right breast (venus clip). No apparent complications. 2. Ultrasound-guided biopsy of a mass in the low anterior right axilla (coil clip). No apparent complications. 3. Ultrasound-guided biopsy of a right axillary lymph node (HydroMARK spiral clip). No apparent complications. Electronically Signed: By: Frederico Hamman M.D. On: 06/22/2022 11:42  [place] AXILLARY NODE CORE BIOPSY RIGHT  Addendum Date: 06/26/2022   ADDENDUM REPORT: 06/26/2022 11:04 ADDENDUM: PATHOLOGY revealed: Site 1. Breast, RIGHT, needle core biopsy, upper outer, 3 cm fn, venus clip INVASIVE MODERATELY DIFFERENTIATED DUCTAL ADENOCARCINOMA, GRADE 2 (3+3+1) TUBULE FORMATION: SCORE 3 NUCLEAR PLEOMORPHISM: SCORE 3 MITOTIC COUNT: SCORE 1 TOTAL SCORE: 7 OVERALL GRADE GRADE 2 (7/9) MICROCALCIFICATIONS PRESENT WITHIN PERITUMORAL STROMA NEGATIVE FOR ANGIOLYMPHATIC INVASION TUMOR MEASURES 9 MM IN GREATEST LINEAR EXTENT Pathology results are CONCORDANT with imaging findings, per Dr. Frederico Hamman. PATHOLOGY revealed: Site 2. Lymph node, needle/core biopsy, low anterior RIGHT axilla, coil clip MODERATELY DIFFERENTIATED DUCTAL ADENOCARCINOMA, GRADE 2 (3+3+1) WITHIN DESMOPLASTIC AND FIBROTIC STROMA SCANT LYMPHOID STROMA Pathology results are CONCORDANT with imaging findings, per Dr. Frederico Hamman. PATHOLOGY revealed: Site 3. Lymph node, needle/core biopsy, RIGHT axillary, hydromark spiral clip COMPATIBLE WITH A METASTATIC MODERATELY DIFFERENTIATED DUCTAL ADENOCARCINOMA SCANT LYMPHOID STROMA Pathology results are CONCORDANT with imaging findings, per Dr. Frederico Hamman.  Pathology results and recommendations below were discussed with patient by telephone on 06/25/2022. Patient reported biopsy site within normal limits with slight tenderness at the site. Post biopsy care instructions were reviewed, questions were answered and my direct phone number was provided to patient. Patient was instructed to call [facility] if any concerns or questions arise related to the biopsy. The patient was referred to the [facility] at [facility] with appointment on 07/04/2022. Pathology results reported by Lynett Grimes, RN on 06/25/2022. Electronically Signed   By: Frederico Hamman M.D.   On: 06/26/2022 11:04   Result Date: 06/26/2022 CLINICAL DATA:  57 year old female presenting for ultrasound-guided biopsy of a right breast mass and right axillary lymph node. EXAM: ULTRASOUND GUIDED RIGHT BREAST CORE NEEDLE  BIOPSY COMPARISON:  Previous exam(s). PROCEDURE: I met with the patient and we discussed the procedure of ultrasound-guided biopsy, including benefits and alternatives. We discussed the high likelihood of a successful procedure. We discussed the risks of the procedure, including infection, bleeding, tissue injury, clip migration, and inadequate sampling. Informed written consent was given. The usual time-out protocol was performed immediately prior to the procedure. Lesion quadrant: Upper outer quadrant Using sterile technique and 1% Lidocaine as local anesthetic, under direct ultrasound visualization, a 14 gauge spring-loaded device was used to perform biopsy of a mass in the upper-outer right breast, 4 cm from the nipple using an inferolateral approach. At the conclusion of the procedure a venus shaped tissue marker clip was deployed into the biopsy cavity. Lesion quadrant: Right axilla Upon scanning in preparation for biopsy, an additional mass was found in the low anterior right axilla measuring 1.6 x 1.4 x 0.9  cm. This mass could represent an additional site of primary malignancy versus a completely replaced metastatic lymph node. Using ChloraPrep, sterile technique and 1% Lidocaine with epinephrine as local anesthetic, under direct ultrasound visualization, a 14 gauge spring-loaded device was used to perform biopsy of a mass in the low anterior right axilla using an inferolateral approach. At the conclusion of the procedure a coil shaped tissue marker clip was deployed into the biopsy cavity. Lesion quadrant: Right axilla Using ChloraPrep, sterile technique and 1% Lidocaine with epinephrine as local anesthetic, under direct ultrasound visualization, a 14 gauge spring-loaded device was used to perform biopsy of a right axillary lymph node using an inferior approach. At the conclusion of the procedure a HydroMARK spiral shaped tissue marker clip was deployed into the biopsy cavity. Follow up 2 view mammogram was performed and dictated separately. IMPRESSION: 1. Ultrasound guided biopsy of a mass in the upper-outer quadrant of the right breast (venus clip). No apparent complications. 2. Ultrasound-guided biopsy of a mass in the low anterior right axilla (coil clip). No apparent complications. 3. Ultrasound-guided biopsy of a right axillary lymph node (HydroMARK spiral clip). No apparent complications. Electronically Signed: By: Frederico Hamman M.D. On: 06/22/2022 11:42  [place] AXILLARY NODE CORE BIOPSY RIGHT  Addendum Date: 06/26/2022   ADDENDUM REPORT: 06/26/2022 11:04 ADDENDUM: PATHOLOGY revealed: Site 1. Breast, RIGHT, needle core biopsy, upper outer, 3 cm fn, venus clip INVASIVE MODERATELY DIFFERENTIATED DUCTAL ADENOCARCINOMA, GRADE 2 (3+3+1) TUBULE FORMATION: SCORE 3 NUCLEAR PLEOMORPHISM: SCORE 3 MITOTIC COUNT: SCORE 1 TOTAL SCORE: 7 OVERALL GRADE GRADE 2 (7/9) MICROCALCIFICATIONS PRESENT WITHIN PERITUMORAL STROMA NEGATIVE FOR ANGIOLYMPHATIC INVASION TUMOR MEASURES 9 MM IN GREATEST LINEAR EXTENT Pathology results are  CONCORDANT with imaging findings, per Dr. Frederico Hamman. PATHOLOGY revealed: Site 2. Lymph node, needle/core biopsy, low anterior RIGHT axilla, coil clip MODERATELY DIFFERENTIATED DUCTAL ADENOCARCINOMA, GRADE 2 (3+3+1) WITHIN DESMOPLASTIC AND FIBROTIC STROMA SCANT LYMPHOID STROMA Pathology results are CONCORDANT with imaging findings, per Dr. Frederico Hamman. PATHOLOGY revealed: Site 3. Lymph node, needle/core biopsy, RIGHT axillary, hydromark spiral clip COMPATIBLE WITH A METASTATIC MODERATELY DIFFERENTIATED DUCTAL ADENOCARCINOMA SCANT LYMPHOID STROMA Pathology results are CONCORDANT with imaging findings, per Dr. Frederico Hamman. Pathology results and recommendations below were discussed with patient by telephone on 06/25/2022. Patient reported biopsy site within normal limits with slight tenderness at the site. Post biopsy care instructions were reviewed, questions were answered and my direct phone number was provided to patient. Patient was instructed to call [facility] if any concerns or questions arise related to the biopsy. The patient was referred to the [facility] at [facility] with appointment on 07/04/2022. Pathology results reported by Lynett Grimes, RN on 06/25/2022. Electronically Signed   By: Frederico Hamman M.D.   On: 06/26/2022 11:04   Result Date: 06/26/2022 CLINICAL DATA:  57 year old female presenting for ultrasound-guided biopsy of a right breast mass and right axillary lymph node. EXAM: ULTRASOUND GUIDED RIGHT BREAST CORE NEEDLE BIOPSY COMPARISON:  Previous exam(s). PROCEDURE: I met with the patient and we discussed the procedure of ultrasound-guided biopsy, including benefits and alternatives. We discussed the high likelihood of a successful procedure. We discussed the risks of the procedure, including infection, bleeding, tissue injury, clip migration, and inadequate sampling. Informed written consent was  given. The usual time-out protocol was performed immediately prior to the procedure. Lesion quadrant: Upper outer quadrant Using sterile technique and 1% Lidocaine as local anesthetic, under direct ultrasound visualization, a 14 gauge spring-loaded device was used to perform biopsy of a mass in the upper-outer right breast, 4 cm from the nipple using an inferolateral approach. At the conclusion of the procedure a venus shaped tissue marker clip was deployed into the biopsy cavity. Lesion quadrant: Right axilla Upon scanning in preparation for biopsy, an additional mass was found in the low anterior right axilla measuring 1.6 x 1.4 x 0.9 cm. This mass could represent an additional site of primary malignancy versus a completely replaced metastatic lymph node. Using ChloraPrep, sterile technique and 1% Lidocaine with epinephrine as local anesthetic, under direct ultrasound visualization, a 14 gauge spring-loaded device was used to perform biopsy of a mass in the low anterior right axilla using an inferolateral approach. At the conclusion of the procedure a coil shaped tissue marker clip was deployed into the biopsy cavity. Lesion quadrant: Right axilla Using ChloraPrep, sterile technique and 1% Lidocaine with epinephrine as local anesthetic, under direct ultrasound visualization, a 14 gauge spring-loaded device was used to perform biopsy of a right axillary lymph node using an inferior approach. At the conclusion of the procedure a HydroMARK spiral shaped tissue marker clip was deployed into the biopsy cavity. Follow up 2 view mammogram was performed and dictated separately. IMPRESSION: 1. Ultrasound guided biopsy of a mass in the upper-outer quadrant of the right breast (venus clip). No apparent complications. 2. Ultrasound-guided biopsy of a mass in the low anterior right axilla (coil clip). No apparent complications. 3. Ultrasound-guided biopsy of a right axillary lymph node (HydroMARK spiral clip). No apparent  complications. Electronically Signed: By: Frederico Hamman M.D. On: 06/22/2022 11:42  MM CLIP PLACEMENT RIGHT  Result Date: 06/22/2022 CLINICAL DATA:  Post biopsy mammogram of the right breast for clip placement. EXAM: 3D DIAGNOSTIC RIGHT MAMMOGRAM POST ULTRASOUND BIOPSY COMPARISON:  Previous exam(s). FINDINGS: 3D Mammographic images were obtained following ultrasound guided biopsy of a right breast mass, a mass in the low right axilla and a right axillary lymph node. The biopsy marking clips are in expected position at the sites of biopsy. The [facility] clip placed in the right axillary lymph node is not visualized within the field of view. IMPRESSION: 1. Appropriate positioning of the venus shaped biopsy marking clip at the site of biopsy in the upper-outer right breast. 2. Appropriate positioning of the coil shaped biopsy marking clip in the right axilla. 3. Non visualization of the HydroMARK spiral clip in the right axillary lymph node due to its deep position. Final Assessment: Post Procedure Mammograms for Marker Placement Electronically Signed   By: Frederico Hamman M.D.   On: 06/22/2022  11:43  MM 3D DIAGNOSTIC MAMMOGRAM BILATERAL BREAST  Addendum Date: 06/19/2022   ADDENDUM REPORT: 06/19/2022 15:27 ADDENDUM: The physical exam within the finding sections should state that the right breast is smaller than the left, and that the entire right breast is hard. Electronically Signed   By: Frederico Hamman M.D.   On: 06/19/2022 15:27   Result Date: 06/19/2022 CLINICAL DATA:  57 year old female presenting for evaluation of a palpable lump in the right breast. EXAM: DIGITAL DIAGNOSTIC BILATERAL MAMMOGRAM WITH TOMOSYNTHESIS; ULTRASOUND RIGHT BREAST LIMITED; ULTRASOUND LEFT BREAST LIMITED TECHNIQUE: Bilateral digital diagnostic mammography and breast tomosynthesis was performed.; Targeted ultrasound examination of the right breast was performed; Targeted ultrasound examination of the left breast was  performed. COMPARISON:  Previous exam(s). ACR Breast Density Category c: The breasts are heterogeneously dense, which may obscure small masses. FINDINGS: There is an ill-defined irregular mass involving the central to upper outer portion of the right breast. The right breast globally is smaller than the left side, and demonstrates marked diffuse skin thickening. Skin thickening is noted along the medial aspect of the left breast as well, which may be an extension from the thickening on the right. There is an asymmetry in the upper outer posterior left breast on the initial routine views. Spot compression tomosynthesis images through the upper outer posterior left breast demonstrates an improved appearance of this asymmetry, though ultrasound will be performed for further evaluation. Physical exam of the right breast demonstrates that there is peau d'orange appearance of the skin. The breast is smaller than the right and the entire breast is hard. Ultrasound of the central to upper outer right breast demonstrates hypoechoic heterogeneous tissue. Due to the density of the breast and large ill-defined appearance of the mass, accurate measurements cannot be obtained sonographically. However the mass appears to be least 8-9 cm. Ultrasound of the right axilla demonstrates 2 abnormally thickened lymph nodes with cortices measuring 6-7 mm, and 1 borderline lymph node with a cortex of just over 3 mm. Ultrasound of the upper-outer quadrant of the left breast demonstrates normal fibroglandular tissue. No suspicious masses are identified. Ultrasound of the left axilla demonstrates lymph nodes with cortices measuring within normal limits. IMPRESSION: 1. There is a large mass in the right breast, which appears to measure 8-9 cm on ultrasound. Accurate measurement is difficult. 2. There are 2 abnormal right axillary lymph nodes, and a third borderline lymph node. 3. Skin thickening is noted along the medial aspect of the left  breast, which may be an extension of the marked skin thickening diffusely on the right. Otherwise, no left breast abnormalities are identified. RECOMMENDATION: 1. Ultrasound-guided biopsy is recommended for the right breast mass and one of the right axillary lymph nodes. The procedure has been scheduled for 06/22/2022 at 9:30 a.m. 2. Following biopsy, bilateral breast MRI is recommended to evaluate extent of disease in the right breast and to evaluate for any chest wall involvement. MRI would also be helpful to further screen the left breast given the skin thickening seen medially, to ensure that there are no suspicious abnormalities within the breast. I have discussed the findings and recommendations with the patient. If applicable, a reminder letter will be sent to the patient regarding the next appointment. BI-RADS CATEGORY  5: Highly suggestive of malignancy. Electronically Signed: By: Frederico Hamman M.D. On: 06/19/2022 12:37   [place] LIMITED ULTRASOUND INCLUDING AXILLA RIGHT BREAST  Addendum Date: 06/19/2022   ADDENDUM REPORT: 06/19/2022 15:27 ADDENDUM: The physical exam within the finding  sections should state that the right breast is smaller than the left, and that the entire right breast is hard. Electronically Signed   By: Frederico Hamman M.D.   On: 06/19/2022 15:27   Result Date: 06/19/2022 CLINICAL DATA:  57 year old female presenting for evaluation of a palpable lump in the right breast. EXAM: DIGITAL DIAGNOSTIC BILATERAL MAMMOGRAM WITH TOMOSYNTHESIS; ULTRASOUND RIGHT BREAST LIMITED; ULTRASOUND LEFT BREAST LIMITED TECHNIQUE: Bilateral digital diagnostic mammography and breast tomosynthesis was performed.; Targeted ultrasound examination of the right breast was performed; Targeted ultrasound examination of the left breast was performed. COMPARISON:  Previous exam(s). ACR Breast Density Category c: The breasts are heterogeneously dense, which may obscure small masses. FINDINGS: There is an  ill-defined irregular mass involving the central to upper outer portion of the right breast. The right breast globally is smaller than the left side, and demonstrates marked diffuse skin thickening. Skin thickening is noted along the medial aspect of the left breast as well, which may be an extension from the thickening on the right. There is an asymmetry in the upper outer posterior left breast on the initial routine views. Spot compression tomosynthesis images through the upper outer posterior left breast demonstrates an improved appearance of this asymmetry, though ultrasound will be performed for further evaluation. Physical exam of the right breast demonstrates that there is peau d'orange appearance of the skin. The breast is smaller than the right and the entire breast is hard. Ultrasound of the central to upper outer right breast demonstrates hypoechoic heterogeneous tissue. Due to the density of the breast and large ill-defined appearance of the mass, accurate measurements cannot be obtained sonographically. However the mass appears to be least 8-9 cm. Ultrasound of the right axilla demonstrates 2 abnormally thickened lymph nodes with cortices measuring 6-7 mm, and 1 borderline lymph node with a cortex of just over 3 mm. Ultrasound of the upper-outer quadrant of the left breast demonstrates normal fibroglandular tissue. No suspicious masses are identified. Ultrasound of the left axilla demonstrates lymph nodes with cortices measuring within normal limits. IMPRESSION: 1. There is a large mass in the right breast, which appears to measure 8-9 cm on ultrasound. Accurate measurement is difficult. 2. There are 2 abnormal right axillary lymph nodes, and a third borderline lymph node. 3. Skin thickening is noted along the medial aspect of the left breast, which may be an extension of the marked skin thickening diffusely on the right. Otherwise, no left breast abnormalities are identified. RECOMMENDATION: 1.  Ultrasound-guided biopsy is recommended for the right breast mass and one of the right axillary lymph nodes. The procedure has been scheduled for 06/22/2022 at 9:30 a.m. 2. Following biopsy, bilateral breast MRI is recommended to evaluate extent of disease in the right breast and to evaluate for any chest wall involvement. MRI would also be helpful to further screen the left breast given the skin thickening seen medially, to ensure that there are no suspicious abnormalities within the breast. I have discussed the findings and recommendations with the patient. If applicable, a reminder letter will be sent to the patient regarding the next appointment. BI-RADS CATEGORY  5: Highly suggestive of malignancy. Electronically Signed: By: Frederico Hamman M.D. On: 06/19/2022 12:37  [place] LIMITED ULTRASOUND INCLUDING AXILLA LEFT BREAST   Addendum Date: 06/19/2022   ADDENDUM REPORT: 06/19/2022 15:27 ADDENDUM: The physical exam within the finding sections should state that the right breast is smaller than the left, and that the entire right breast is hard. Electronically Signed   By:  Frederico Hamman M.D.   On: 06/19/2022 15:27   Result Date: 06/19/2022 CLINICAL DATA:  57 year old female presenting for evaluation of a palpable lump in the right breast. EXAM: DIGITAL DIAGNOSTIC BILATERAL MAMMOGRAM WITH TOMOSYNTHESIS; ULTRASOUND RIGHT BREAST LIMITED; ULTRASOUND LEFT BREAST LIMITED TECHNIQUE: Bilateral digital diagnostic mammography and breast tomosynthesis was performed.; Targeted ultrasound examination of the right breast was performed; Targeted ultrasound examination of the left breast was performed. COMPARISON:  Previous exam(s). ACR Breast Density Category c: The breasts are heterogeneously dense, which may obscure small masses. FINDINGS: There is an ill-defined irregular mass involving the central to upper outer portion of the right breast. The right breast globally is smaller than the left side, and demonstrates marked  diffuse skin thickening. Skin thickening is noted along the medial aspect of the left breast as well, which may be an extension from the thickening on the right. There is an asymmetry in the upper outer posterior left breast on the initial routine views. Spot compression tomosynthesis images through the upper outer posterior left breast demonstrates an improved appearance of this asymmetry, though ultrasound will be performed for further evaluation. Physical exam of the right breast demonstrates that there is peau d'orange appearance of the skin. The breast is smaller than the right and the entire breast is hard. Ultrasound of the central to upper outer right breast demonstrates hypoechoic heterogeneous tissue. Due to the density of the breast and large ill-defined appearance of the mass, accurate measurements cannot be obtained sonographically. However the mass appears to be least 8-9 cm. Ultrasound of the right axilla demonstrates 2 abnormally thickened lymph nodes with cortices measuring 6-7 mm, and 1 borderline lymph node with a cortex of just over 3 mm. Ultrasound of the upper-outer quadrant of the left breast demonstrates normal fibroglandular tissue. No suspicious masses are identified. Ultrasound of the left axilla demonstrates lymph nodes with cortices measuring within normal limits. IMPRESSION: 1. There is a large mass in the right breast, which appears to measure 8-9 cm on ultrasound. Accurate measurement is difficult. 2. There are 2 abnormal right axillary lymph nodes, and a third borderline lymph node. 3. Skin thickening is noted along the medial aspect of the left breast, which may be an extension of the marked skin thickening diffusely on the right. Otherwise, no left breast abnormalities are identified. RECOMMENDATION: 1. Ultrasound-guided biopsy is recommended for the right breast mass and one of the right axillary lymph nodes. The procedure has been scheduled for 06/22/2022 at 9:30 a.m. 2.  Following biopsy, bilateral breast MRI is recommended to evaluate extent of disease in the right breast and to evaluate for any chest wall involvement. MRI would also be helpful to further screen the left breast given the skin thickening seen medially, to ensure that there are no suspicious abnormalities within the breast. I have discussed the findings and recommendations with the patient. If applicable, a reminder letter will be sent to the patient regarding the next appointment. BI-RADS CATEGORY  5: Highly suggestive of malignancy. Electronically Signed: By: Frederico Hamman M.D. On: 06/19/2022 12:37     IMPRESSION: *** {DIAGNOSIS HERE}  Patient will be a good candidate for breast conservation with radiotherapy to *** breast. We discussed the general course of radiation, potential side effects, and toxicities with radiation and the patient is interested in this approach. ***   PLAN:  *** (copy notes from board at conference)   ------------------------------------------------  Billie Lade, PhD, MD  This document serves as a record of services personally  performed by Antony Blackbird, MD. It was created on his behalf by Neena Rhymes, a trained medical scribe. The creation of this record is based on the scribe's personal observations and the provider's statements to them. This document has been checked and approved by the attending provider.

## 2022-07-04 ENCOUNTER — Encounter: Payer: Self-pay | Admitting: *Deleted

## 2022-07-04 ENCOUNTER — Inpatient Hospital Stay: Payer: 59 | Attending: Hematology and Oncology

## 2022-07-04 ENCOUNTER — Inpatient Hospital Stay: Payer: 59 | Admitting: Licensed Clinical Social Worker

## 2022-07-04 ENCOUNTER — Ambulatory Visit: Payer: 59 | Attending: Surgery | Admitting: Physical Therapy

## 2022-07-04 ENCOUNTER — Inpatient Hospital Stay (HOSPITAL_BASED_OUTPATIENT_CLINIC_OR_DEPARTMENT_OTHER): Payer: 59 | Admitting: Hematology and Oncology

## 2022-07-04 ENCOUNTER — Ambulatory Visit
Admission: RE | Admit: 2022-07-04 | Discharge: 2022-07-04 | Disposition: A | Discharge status: Home / Self Care | Payer: 59 | Source: Ambulatory Visit | Attending: Radiation Oncology | Admitting: Radiation Oncology

## 2022-07-04 ENCOUNTER — Other Ambulatory Visit: Payer: Self-pay

## 2022-07-04 ENCOUNTER — Encounter: Payer: Self-pay | Admitting: Hematology and Oncology

## 2022-07-04 ENCOUNTER — Encounter: Payer: Self-pay | Admitting: Physical Therapy

## 2022-07-04 ENCOUNTER — Inpatient Hospital Stay (HOSPITAL_BASED_OUTPATIENT_CLINIC_OR_DEPARTMENT_OTHER): Payer: 59 | Admitting: Genetic Counselor

## 2022-07-04 ENCOUNTER — Ambulatory Visit: Payer: Self-pay | Admitting: Surgery

## 2022-07-04 VITALS — BP 127/84 | HR 82 | Temp 97.4°F | Resp 18 | Ht 65.0 in | Wt 170.5 lb

## 2022-07-04 DIAGNOSIS — R293 Abnormal posture: Secondary | ICD-10-CM | POA: Diagnosis present

## 2022-07-04 DIAGNOSIS — Z170 Estrogen receptor positive status [ER+]: Secondary | ICD-10-CM | POA: Diagnosis present

## 2022-07-04 DIAGNOSIS — Z780 Asymptomatic menopausal state: Secondary | ICD-10-CM | POA: Diagnosis not present

## 2022-07-04 DIAGNOSIS — Z8249 Family history of ischemic heart disease and other diseases of the circulatory system: Secondary | ICD-10-CM | POA: Diagnosis not present

## 2022-07-04 DIAGNOSIS — C50411 Malignant neoplasm of upper-outer quadrant of right female breast: Secondary | ICD-10-CM | POA: Diagnosis present

## 2022-07-04 DIAGNOSIS — Z90710 Acquired absence of both cervix and uterus: Secondary | ICD-10-CM | POA: Diagnosis not present

## 2022-07-04 DIAGNOSIS — Z79899 Other long term (current) drug therapy: Secondary | ICD-10-CM

## 2022-07-04 DIAGNOSIS — R600 Localized edema: Secondary | ICD-10-CM | POA: Diagnosis present

## 2022-07-04 DIAGNOSIS — Z9049 Acquired absence of other specified parts of digestive tract: Secondary | ICD-10-CM

## 2022-07-04 DIAGNOSIS — F17210 Nicotine dependence, cigarettes, uncomplicated: Secondary | ICD-10-CM | POA: Diagnosis not present

## 2022-07-04 DIAGNOSIS — Z86010 Personal history of colonic polyps: Secondary | ICD-10-CM | POA: Insufficient documentation

## 2022-07-04 DIAGNOSIS — R591 Generalized enlarged lymph nodes: Secondary | ICD-10-CM | POA: Diagnosis not present

## 2022-07-04 DIAGNOSIS — Z833 Family history of diabetes mellitus: Secondary | ICD-10-CM | POA: Diagnosis not present

## 2022-07-04 LAB — CBC WITH DIFFERENTIAL (CANCER CENTER ONLY)
Abs Immature Granulocytes: 0.04 10*3/uL (ref 0.00–0.07)
Basophils Absolute: 0.1 10*3/uL (ref 0.0–0.1)
Basophils Relative: 1 %
Eosinophils Absolute: 0.1 10*3/uL (ref 0.0–0.5)
Eosinophils Relative: 2 %
HCT: 42.8 % (ref 36.0–46.0)
Hemoglobin: 14.6 g/dL (ref 12.0–15.0)
Immature Granulocytes: 1 %
Lymphocytes Relative: 25 %
Lymphs Abs: 1.6 10*3/uL (ref 0.7–4.0)
MCH: 30.6 pg (ref 26.0–34.0)
MCHC: 34.1 g/dL (ref 30.0–36.0)
MCV: 89.7 fL (ref 80.0–100.0)
Monocytes Absolute: 0.4 10*3/uL (ref 0.1–1.0)
Monocytes Relative: 6 %
Neutro Abs: 4.2 10*3/uL (ref 1.7–7.7)
Neutrophils Relative %: 65 %
Platelet Count: 242 10*3/uL (ref 150–400)
RBC: 4.77 MIL/uL (ref 3.87–5.11)
RDW: 12.8 % (ref 11.5–15.5)
WBC Count: 6.4 10*3/uL (ref 4.0–10.5)
nRBC: 0 % (ref 0.0–0.2)

## 2022-07-04 LAB — CMP (CANCER CENTER ONLY)
ALT: 17 U/L (ref 0–44)
AST: 17 U/L (ref 15–41)
Albumin: 4.3 g/dL (ref 3.5–5.0)
Alkaline Phosphatase: 81 U/L (ref 38–126)
Anion gap: 6 (ref 5–15)
BUN: 14 mg/dL (ref 6–20)
CO2: 28 mmol/L (ref 22–32)
Calcium: 9.9 mg/dL (ref 8.9–10.3)
Chloride: 106 mmol/L (ref 98–111)
Creatinine: 0.75 mg/dL (ref 0.44–1.00)
GFR, Estimated: >60 mL/min (ref >60)
Glucose, Bld: 144 mg/dL — ABNORMAL HIGH (ref 70–99)
Potassium: 3.9 mmol/L (ref 3.5–5.1)
Sodium: 140 mmol/L (ref 135–145)
Total Bilirubin: 0.4 mg/dL (ref 0.3–1.2)
Total Protein: 7.2 g/dL (ref 6.5–8.1)

## 2022-07-04 LAB — GENETIC SCREENING ORDER

## 2022-07-04 MED ORDER — LIDOCAINE-PRILOCAINE 2.5-2.5 % EX CREA
TOPICAL_CREAM | CUTANEOUS | 3 refills | Status: AC
Start: 2022-07-04 — End: ?

## 2022-07-04 MED ORDER — DEXAMETHASONE 4 MG PO TABS
ORAL_TABLET | ORAL | 1 refills | Status: DC
Start: 2022-07-04 — End: 2022-08-13

## 2022-07-04 MED ORDER — PROCHLORPERAZINE MALEATE 10 MG PO TABS
10.0000 mg | ORAL_TABLET | Freq: Four times a day (QID) | ORAL | 1 refills | Status: DC | PRN
Start: 2022-07-04 — End: 2023-03-25

## 2022-07-04 MED ORDER — ONDANSETRON HCL 8 MG PO TABS
ORAL_TABLET | ORAL | 1 refills | Status: DC
Start: 2022-07-04 — End: 2022-12-24

## 2022-07-04 NOTE — H&P (Signed)
Subjective   Chief Complaint: New Consultation (Breast Cancer)   Breast MDC 07/04/2022 [place] [place]  History of Present Illness: Monica Soto is a 57 y.o. female who is seen today as an office consultation at the request of Dr. Al Pimple for evaluation of New Consultation (Breast Cancer) .   This is a 57 year old female with no family history of breast cancer whose last mammogram was in 2016.  Over the last several months the patient felt some odd sensation and itching in her right breast.  About 3 months ago she started feeling firmness and thickening of the skin of the right breast.  This has become more tender.  The breast is entirely firm with discoloration of the skin spreading across the midline.  She underwent evaluation with diagnostic mammogram and ultrasound.  The right breast is almost entirely replaced by a firm hard mass measuring at least 9 cm.  Ultrasound of the axilla showed 2 thickened lymph nodes.  She underwent biopsy of the mass as well as the 2 enlarged lymph nodes.  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  Both biopsy lymph nodes were positive for metastatic carcinoma.  She presents now for her initial evaluation and discussion of treatment options.  The patient is already noticing some swelling in her right hand and arm.  Review of Systems: A complete review of systems was obtained from the patient.  I have reviewed this information and discussed as appropriate with the patient.  See HPI as well for other ROS.  Review of Systems  Constitutional: Negative.   HENT: Negative.    Eyes: Negative.   Respiratory: Negative.    Cardiovascular: Negative.   Gastrointestinal:  Positive for heartburn.  Genitourinary: Negative.   Musculoskeletal: Negative.   Skin: Negative.   Neurological: Negative.   Endo/Heme/Allergies: Negative.   Psychiatric/Behavioral: Negative.        Medical History: Past Medical History:   Diagnosis Date   GERD (gastroesophageal reflux disease)    History of cancer    IBS (irritable bowel syndrome)     Patient Active Problem List  Diagnosis   Malignant neoplasm of upper-outer quadrant of right breast in female, estrogen receptor positive (CMS/HHS-HCC)   Irritable bowel syndrome   Esophageal reflux    Past Surgical History:  Procedure Laterality Date   APPENDECTOMY     HYSTERECTOMY     LAPAROSCOPIC TUBAL LIGATION     TONSILLECTOMY       No Known Allergies  Current Outpatient Medications on File Prior to Visit  Medication Sig Dispense Refill   aspirin-acetaminophen-caffeine (EXCEDRIN MIGRAINE) 250-250-65 mg per tablet Take by mouth     benzonatate (TESSALON) 100 MG capsule Take by mouth     cetirizine (ZYRTEC) 10 MG tablet Take 1 tablet by mouth once daily     cyclobenzaprine (FLEXERIL) 10 MG tablet Take 10 mg by mouth 3 (three) times daily as needed     diphenhydrAMINE (BENADRYL) 25 mg capsule Take 25 mg by mouth every 6 (six) hours as needed     fluticasone propionate (FLONASE) 50 mcg/actuation nasal spray Place 2 sprays into one nostril once daily     naproxen (NAPROSYN) 500 MG tablet Take 500 mg by mouth 2 (two) times daily with meals     ondansetron (ZOFRAN-ODT) 4 MG disintegrating tablet Take 4 mg by mouth every 8 (eight) hours as needed     promethazine-dextromethorphan (PROMETHAZINE-DM) 6.25-15 mg/5 mL syrup TAKE 5 ML BY MOUTH  EVERY 6 HOURS FOR 4 DAYS AS NEEDED FOR COUGH     No current facility-administered medications on file prior to visit.    History reviewed. No pertinent family history.   Social History   Tobacco Use  Smoking Status Every Day   Current packs/day: 0.50   Types: Cigarettes  Smokeless Tobacco Never     Social History   Socioeconomic History   Marital status: Married  Tobacco Use   Smoking status: Every Day    Current packs/day: 0.50    Types: Cigarettes   Smokeless tobacco: Never  Vaping Use   Vaping status: Never  Used  Substance and Sexual Activity   Alcohol use: Never   Drug use: Never    Objective:    Physical Exam   Constitutional:  WDWN in NAD, conversant, no obvious deformities; lying in bed comfortably Eyes:  Pupils equal, round; sclera anicteric; moist conjunctiva; no lid lag HENT:  Oral mucosa moist; good dentition  Neck:  No masses palpated, trachea midline; no thyromegaly Lungs:  CTA bilaterally; normal respiratory effort Breasts: The right breast shows a very large fixed breast mass occupying the entire right breast with palpable macroscopic nodules under the skin.  The skin is discolored and purplish.  The thickening of the skin extends across the midline.  No obvious ulcerations.  She has several palpable right axilla lymph nodes that appear to be fixed.  No palpable masses in the left breast. CV:  Regular rate and rhythm; no murmurs; extremities well-perfused with no edema Abd:  +bowel sounds, soft, non-tender, no palpable organomegaly; no palpable hernias Musc: Normal gait; no apparent clubbing or cyanosis in extremities Lymphatic:  No palpable cervical or axillary lymphadenopathy Skin:  Warm, dry; no sign of jaundice Psychiatric - alert and oriented x 4; calm mood and affect   Labs, Imaging and Diagnostic Testing:  Diagnosis 1. Breast, right, needle core biopsy, upper outer, 3 cmfn, venus clip INVASIVE MODERATELY DIFFERENTIATED DUCTAL ADENOCARCINOMA, GRADE 2 (3+3+1) TUBULE FORMATION: SCORE 3 NUCLEAR PLEOMORPHISM: SCORE 3 MITOTIC COUNT: SCORE 1 TOTAL SCORE: 7 OVERALL GRADE GRADE 2 (7/9) MICROCALCIFICATIONS PRESENT WITHIN PERITUMORAL STROMA NEGATIVE FOR ANGIOLYMPHATIC INVASION TUMOR MEASURES 9 MM IN GREATEST LINEAR EXTENT 2. Lymph node, needle/core biopsy, low anterior right axilla, coil clip MODERATELY DIFFERENTIATED DUCTAL ADENOCARCINOMA, GRADE 2 (3+3+1) WITHIN DESMOPLASTIC AND FIBROTIC STROMA SCANT LYMPHOID STROMA 3. Lymph node, needle/core biopsy, right axillary,  hydromark spiral clip COMPATIBLE WITH A METASTATIC MODERATELY DIFFERENTIATED DUCTAL ADENOCARCINOMA SCANT LYMPHOID STROMA The tumor cells are negative for Her2 (1+). Estrogen Receptor: 90%, POSITIVE, STRONG STAINING INTENSITY Progesterone Receptor: 70%, POSITIVE, STRONG STAINING INTENSITY Proliferation Marker Ki67: 20% REFERENCE RANGE ESTROGEN RECEPTOR NEGATIVE 0% POSITIVE =>1% REFERENCE RANGE PROGESTERONE RECEPTOR NEGATIVE 0% POSITIVE =>1% All controls stained appropriately Marlena Clipper MD Pathologist, Electronic Signature ( Signed 06/26/2022)   CLINICAL DATA:  57 year old female presenting for evaluation of a palpable lump in the right breast.   EXAM: DIGITAL DIAGNOSTIC BILATERAL MAMMOGRAM WITH TOMOSYNTHESIS; ULTRASOUND RIGHT BREAST LIMITED; ULTRASOUND LEFT BREAST LIMITED   TECHNIQUE: Bilateral digital diagnostic mammography and breast tomosynthesis was performed.; Targeted ultrasound examination of the right breast was performed; Targeted ultrasound examination of the left breast was performed.   COMPARISON:  Previous exam(s).   ACR Breast Density Category c: The breasts are heterogeneously dense, which may obscure small masses.   FINDINGS: There is an ill-defined irregular mass involving the central to upper outer portion of the right breast. The right breast globally is smaller than the left side, and demonstrates  marked diffuse skin thickening. Skin thickening is noted along the medial aspect of the left breast as well, which may be an extension from the thickening on the right.   There is an asymmetry in the upper outer posterior left breast on the initial routine views. Spot compression tomosynthesis images through the upper outer posterior left breast demonstrates an improved appearance of this asymmetry, though ultrasound will be performed for further evaluation.   Physical exam of the right breast demonstrates that there is peau d'orange appearance of the  skin. The breast is smaller than the right and the entire breast is hard.   Ultrasound of the central to upper outer right breast demonstrates hypoechoic heterogeneous tissue. Due to the density of the breast and large ill-defined appearance of the mass, accurate measurements cannot be obtained sonographically. However the mass appears to be least 8-9 cm. Ultrasound of the right axilla demonstrates 2 abnormally thickened lymph nodes with cortices measuring 6-7 mm, and 1 borderline lymph node with a cortex of just over 3 mm.   Ultrasound of the upper-outer quadrant of the left breast demonstrates normal fibroglandular tissue. No suspicious masses are identified. Ultrasound of the left axilla demonstrates lymph nodes with cortices measuring within normal limits.   IMPRESSION: 1. There is a large mass in the right breast, which appears to measure 8-9 cm on ultrasound. Accurate measurement is difficult.   2. There are 2 abnormal right axillary lymph nodes, and a third borderline lymph node.   3. Skin thickening is noted along the medial aspect of the left breast, which may be an extension of the marked skin thickening diffusely on the right. Otherwise, no left breast abnormalities are identified.   RECOMMENDATION: 1. Ultrasound-guided biopsy is recommended for the right breast mass and one of the right axillary lymph nodes. The procedure has been scheduled for 06/22/2022 at 9:30 a.m.   2. Following biopsy, bilateral breast MRI is recommended to evaluate extent of disease in the right breast and to evaluate for any chest wall involvement. MRI would also be helpful to further screen the left breast given the skin thickening seen medially, to ensure that there are no suspicious abnormalities within the breast.   I have discussed the findings and recommendations with the patient. If applicable, a reminder letter will be sent to the patient regarding the next appointment.   BI-RADS  CATEGORY  5: Highly suggestive of malignancy.   Electronically Signed: By: Frederico Hamman M.D. On: 06/19/2022 12:37      Assessment and Plan:  Diagnoses and all orders for this visit:  Malignant neoplasm of upper-outer quadrant of right breast in female, estrogen receptor positive (CMS/HHS-HCC)     I had a long discussion with the other members of the treatment team as well as the patient and her husband.  Primary surgical options are limited due to the involvement of the skin.  There would be no skin to close over this large wound.  We will attempt neoadjuvant chemotherapy to try to downsize the tumor which may make surgical approach more amenable.  We will plan ultrasound guided port placement urgently to initiate chemotherapy.The surgical procedure has been discussed with the patient.  Potential risks, benefits, alternative treatments, and expected outcomes have been explained.  All of the patient's questions at this time have been answered.  The likelihood of reaching the patient's treatment goal is good.  The patient understand the proposed surgical procedure and wishes to proceed.  Even if the patient has some  response to neoadjuvant chemotherapy, she will likely require mastectomy and postmastectomy radiation in the future.  I do not recommend immediate reconstruction but the patient may be a candidate for delayed reconstruction.   Chalee Hirota Delbert Harness, MD  07/04/2022 2:17 PM

## 2022-07-04 NOTE — Research (Signed)
Trial:  [employer] 2021-05 - Specimen Collection Study to Evaluate Biomarkers in Subjects with Cancer  Patient Monica Soto was identified by Dr Al Pimple as a potential candidate for the above listed study.  This Clinical Research Nurse met with SHANIKQUA RUNNING, [id number], on 07/04/22 in a manner and location that ensures patient privacy to discuss participation in the above listed research study.  Patient is Accompanied by her husband .  A copy of the informed consent document with embedded HIPAA language was provided to the patient.  Patient reads, speaks, and understands [place].   Patient was provided with the business card of this Nurse and encouraged to contact the research team with any questions.  Approximately 15 minutes were spent with the patient reviewing the informed consent documents.  Patient was provided the option of taking informed consent documents home to review and was encouraged to review at their convenience with their support network, including other care providers. Patient took the consent documents home to review. Plan f/u by phone Monday 07/09/22. Confirmed phone number [phone].  Margret Chance Dania Marsan, RN, BSN, [facility]  Her  Hers Clinical Research Nurse [facility] Direct Dial [phone]  Pager [phone] 07/04/2022 12:13 PM

## 2022-07-04 NOTE — Progress Notes (Signed)
REFERRING PROVIDER: Rachel Moulds, MD  PRIMARY PROVIDER:  Assunta Found, MD  PRIMARY REASON FOR VISIT:  Encounter Diagnosis  Name Primary?   Malignant neoplasm of upper-outer quadrant of right breast in female, estrogen receptor positive (HCC) Yes    HISTORY OF PRESENT ILLNESS:   Monica Soto, a 57 y.o. female, was seen for a [facility] cancer genetics consultation during the breast multidisciplinary clinic at the request of Dr. Al Pimple due to a personal history of cancer.  Monica Soto presents to clinic today to discuss the possibility of a hereditary predisposition to cancer, to discuss genetic testing, and to further clarify her future cancer risks, as well as potential cancer risks for family members.   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   RISK FACTORS:  Menarche was at age 43.  First live birth at age 55.  OCP use for approximately  16-18  years.  Ovaries intact: yes.  Uterus intact: no.  Menopausal status: postmenopausal.  HRT use: 0 years. Colonoscopy: yes;  ~2013 . Mammogram within the last year: yes. Any excessive radiation exposure in the past: no  Past Medical History:  Diagnosis Date   Depression    Erosive esophagitis 04/26/10   EGD by Dr. Jena Gauss, small hiatal hernia   GERD (gastroesophageal reflux disease)    History of stomach ulcers    IBS (irritable bowel syndrome)    Diarrhea predominant   Microscopic colitis 04/26/2010   Colonoscopy by Dr. Jena Gauss, good response with Entocort   Tubular adenoma of colon 04/26/10   Junction of descending and sigmoid 40 CM from anus    Past Surgical History:  Procedure Laterality Date   ABDOMINAL HYSTERECTOMY     APPENDECTOMY  2/11   Dr. Malvin Johns with a delayed closure   BREAST BIOPSY Right 06/22/2022   [place] RT BREAST BX W LOC DEV 1ST LESION IMG BX SPEC US GUIDE 06/22/2022 [facility]   TONSILLECTOMY     TUBAL  LIGATION      Social History   Socioeconomic History   Marital status: Married    Spouse name: Not on file   Number of children: Not on file   Years of education: Not on file   Highest education level: Not on file  Occupational History    Employer: [place] [employer]    Comment: Third shift  Tobacco Use   Smoking status: Every Day    Packs/day: .5    Types: Cigarettes   Smokeless tobacco: Never  Vaping Use   Vaping Use: Never used  Substance and Sexual Activity   Alcohol use: Yes    Alcohol/week: 1.0 standard drink of alcohol    Types: 1 Glasses of wine per week    Comment: seldom   Drug use: No   Sexual activity: Yes    Birth control/protection: Surgical  Other Topics Concern   Not on file  Social History Narrative   Not on file   Social Determinants of Health   Financial Resource Strain: Not on file  Food Insecurity: Not on file  Transportation Needs: Not on file  Physical Activity: Not on file  Stress: Not on file  Social Connections: Not on file     FAMILY HISTORY:  We obtained a detailed, 4-generation family history.  Monica Soto is unaware of previous family history of cancer and/or genetic testing for hereditary cancer risks. There is no reported Ashkenazi  Jewish ancestry.      GENETIC COUNSELING ASSESSMENT: Monica Soto is a 57 y.o. female with a personal history of cancer which is somewhat suggestive of a hereditary cancer syndrome and predisposition to cancer. We, therefore, discussed and recommended the following at today's visit.   DISCUSSION: We discussed that 5 - 10% of cancer is hereditary, with most cases of hereditary breast cancer associated with mutations in BRCA1/2.  There are other genes that can be associated with hereditary breast cancer syndromes. Type of cancer risk and level of risk are gene-specific. We discussed that testing is beneficial for several reasons including knowing how to follow individuals after completing their treatment,  identifying whether potential treatment options would be beneficial, and understanding if other family members could be at risk for cancer and allowing them to undergo genetic testing.   We reviewed the characteristics, features and inheritance patterns of hereditary cancer syndromes. We also discussed genetic testing, including the appropriate family members to test, the process of testing, insurance coverage and turn-around-time for results. We discussed the implications of a negative, positive and/or variant of uncertain significant result.   Monica Soto elected to have [facility] Custom Panel. The Custom Hereditary Cancers Panel offered by [facility] includes sequencing and/or deletion duplication testing of the following 43 genes: APC, ATM, AXIN2, BAP1, BARD1, BMPR1A, BRCA1, BRCA2, BRIP1, CDH1, CDK4, CDKN2A (p14ARF and p16INK4a only), CHEK2, CTNNA1, EPCAM (Deletion/duplication testing only), FH, GREM1 (promoter region duplication testing only), HOXB13, KIT, MBD4, MEN1, MLH1, MSH2, MSH3, MSH6, MUTYH, NF1, NHTL1, PALB2, PDGFRA, PMS2, POLD1, POLE, PTEN, RAD51C, RAD51D, SMAD4, SMARCA4. STK11, TP53, TSC1, TSC2, and VHL.  Based on Monica Soto personal history of cancer, she does not meet NCCN criteria for testing. However, we are currently recommending genetic testing to all women diagnosed with breast cancer under age 68. Despite that she meets criteria, she may still have an out of pocket cost. We discussed that if her out of pocket cost for testing is over $100, the laboratory should contact them to discuss self-pay prices, patient pay assistance programs, if applicable, and other billing options.   PLAN: After considering the risks, benefits, and limitations, Monica Soto provided informed consent to pursue genetic testing and the blood sample was sent to [facility] for analysis of the Custom Panel. Results should be available within approximately 2-3 weeks' time, at which point they will be disclosed  by telephone to Monica Soto, as will any additional recommendations warranted by these results. Monica Soto will receive a summary of her genetic counseling visit and a copy of her results once available. This information will also be available in Epic.   Monica Soto questions were answered to her satisfaction today. Our contact information was provided should additional questions or concerns arise. Thank you for the referral and allowing [place] to share in the care of your patient.   Lalla Brothers, MS, [facility] Genetic Counselor [email or web address] (P) [phone]  The patient was seen for a total of 20 minutes in face-to-face genetic counseling.  The patient brought her husband. Drs. Pamelia Hoit and/or Mosetta Putt were available to discuss this case as needed.  _______________________________________________________________________ For Office Staff:  Number of people involved in session: 2 Was an Intern/ student involved with case: no

## 2022-07-04 NOTE — Progress Notes (Signed)
START ON PATHWAY REGIMEN - Breast     Cycles 1 through 4: A cycle is every 14 days:     Doxorubicin      Cyclophosphamide      Pegfilgrastim-[id number]    Cycles 5 through 16: A cycle is every 7 days:     Paclitaxel   **Always confirm dose/schedule in your pharmacy ordering system**  Patient Characteristics: Preoperative or Nonsurgical Candidate, M0 (Clinical Staging), Up to cT4c, Any N, M0, Neoadjuvant Therapy followed by Surgery, Invasive Disease, Chemotherapy, HER2 Negative, ER Positive Therapeutic Status: Preoperative or Nonsurgical Candidate, M0 (Clinical Staging) AJCC M Category: cM0 AJCC Grade: G2 ER Status: Positive (+) AJCC 8 Stage Grouping: IIA HER2 Status: Negative (-) AJCC T Category: cT3 AJCC N Category: cN1 PR Status: Positive (+) Breast Surgical Plan: Neoadjuvant Therapy followed by Surgery Intent of Therapy: Curative Intent, Discussed with Patient

## 2022-07-04 NOTE — H&P (View-Only) (Signed)
 Subjective   Chief Complaint: New Consultation (Breast Cancer)   Breast MDC 07/04/2022 I[place]/ [place]  History of Present Illness: Monica Soto is a 57 y.o. female who is seen today as an office consultation at the request of Dr. Iruku for evaluation of New Consultation (Breast Cancer) .   This is a 57-year-old female with no family history of breast cancer whose last mammogram was in 2016.  Over the last several months the patient felt some odd sensation and itching in her right breast.  About 3 months ago she started feeling firmness and thickening of the skin of the right breast.  This has become more tender.  The breast is entirely firm with discoloration of the skin spreading across the midline.  She underwent evaluation with diagnostic mammogram and ultrasound.  The right breast is almost entirely replaced by a firm hard mass measuring at least 9 cm.  Ultrasound of the axilla showed 2 thickened lymph nodes.  She underwent biopsy of the mass as well as the 2 enlarged lymph nodes.  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  Both biopsy lymph nodes were positive for metastatic carcinoma.  She presents now for her initial evaluation and discussion of treatment options.  The patient is already noticing some swelling in her right hand and arm.  Review of Systems: A complete review of systems was obtained from the patient.  I have reviewed this information and discussed as appropriate with the patient.  See HPI as well for other ROS.  Review of Systems  Constitutional: Negative.   HENT: Negative.    Eyes: Negative.   Respiratory: Negative.    Cardiovascular: Negative.   Gastrointestinal:  Positive for heartburn.  Genitourinary: Negative.   Musculoskeletal: Negative.   Skin: Negative.   Neurological: Negative.   Endo/Heme/Allergies: Negative.   Psychiatric/Behavioral: Negative.        Medical History: Past Medical History:   Diagnosis Date   GERD (gastroesophageal reflux disease)    History of cancer    IBS (irritable bowel syndrome)     Patient Active Problem List  Diagnosis   Malignant neoplasm of upper-outer quadrant of right breast in female, estrogen receptor positive (CMS/HHS-HCC)   Irritable bowel syndrome   Esophageal reflux    Past Surgical History:  Procedure Laterality Date   APPENDECTOMY     HYSTERECTOMY     LAPAROSCOPIC TUBAL LIGATION     TONSILLECTOMY       No Known Allergies  Current Outpatient Medications on File Prior to Visit  Medication Sig Dispense Refill   aspirin-acetaminophen-caffeine (EXCEDRIN MIGRAINE) 250-250-65 mg per tablet Take by mouth     benzonatate (TESSALON) 100 MG capsule Take by mouth     cetirizine (ZYRTEC) 10 MG tablet Take 1 tablet by mouth once daily     cyclobenzaprine (FLEXERIL) 10 MG tablet Take 10 mg by mouth 3 (three) times daily as needed     diphenhydrAMINE (BENADRYL) 25 mg capsule Take 25 mg by mouth every 6 (six) hours as needed     fluticasone propionate (FLONASE) 50 mcg/actuation nasal spray Place 2 sprays into one nostril once daily     naproxen (NAPROSYN) 500 MG tablet Take 500 mg by mouth 2 (two) times daily with meals     ondansetron (ZOFRAN-ODT) 4 MG disintegrating tablet Take 4 mg by mouth every 8 (eight) hours as needed     promethazine-dextromethorphan (PROMETHAZINE-DM) 6.25-15 mg/5 mL syrup TAKE 5 ML BY MOUTH   EVERY 6 HOURS FOR 4 DAYS AS NEEDED FOR COUGH     No current facility-administered medications on file prior to visit.    History reviewed. No pertinent family history.   Social History   Tobacco Use  Smoking Status Every Day   Current packs/day: 0.50   Types: Cigarettes  Smokeless Tobacco Never     Social History   Socioeconomic History   Marital status: Married  Tobacco Use   Smoking status: Every Day    Current packs/day: 0.50    Types: Cigarettes   Smokeless tobacco: Never  Vaping Use   Vaping status: Never  Used  Substance and Sexual Activity   Alcohol use: Never   Drug use: Never    Objective:    Physical Exam   Constitutional:  WDWN in NAD, conversant, no obvious deformities; lying in bed comfortably Eyes:  Pupils equal, round; sclera anicteric; moist conjunctiva; no lid lag HENT:  Oral mucosa moist; good dentition  Neck:  No masses palpated, trachea midline; no thyromegaly Lungs:  CTA bilaterally; normal respiratory effort Breasts: The right breast shows a very large fixed breast mass occupying the entire right breast with palpable macroscopic nodules under the skin.  The skin is discolored and purplish.  The thickening of the skin extends across the midline.  No obvious ulcerations.  She has several palpable right axilla lymph nodes that appear to be fixed.  No palpable masses in the left breast. CV:  Regular rate and rhythm; no murmurs; extremities well-perfused with no edema Abd:  +bowel sounds, soft, non-tender, no palpable organomegaly; no palpable hernias Musc: Normal gait; no apparent clubbing or cyanosis in extremities Lymphatic:  No palpable cervical or axillary lymphadenopathy Skin:  Warm, dry; no sign of jaundice Psychiatric - alert and oriented x 4; calm mood and affect   Labs, Imaging and Diagnostic Testing:  Diagnosis 1. Breast, right, needle core biopsy, upper outer, 3 cmfn, venus clip INVASIVE MODERATELY DIFFERENTIATED DUCTAL ADENOCARCINOMA, GRADE 2 (3+3+1) TUBULE FORMATION: SCORE 3 NUCLEAR PLEOMORPHISM: SCORE 3 MITOTIC COUNT: SCORE 1 TOTAL SCORE: 7 OVERALL GRADE GRADE 2 (7/9) MICROCALCIFICATIONS PRESENT WITHIN PERITUMORAL STROMA NEGATIVE FOR ANGIOLYMPHATIC INVASION TUMOR MEASURES 9 MM IN GREATEST LINEAR EXTENT 2. Lymph node, needle/core biopsy, low anterior right axilla, coil clip MODERATELY DIFFERENTIATED DUCTAL ADENOCARCINOMA, GRADE 2 (3+3+1) WITHIN DESMOPLASTIC AND FIBROTIC STROMA SCANT LYMPHOID STROMA 3. Lymph node, needle/core biopsy, right axillary,  hydromark spiral clip COMPATIBLE WITH A METASTATIC MODERATELY DIFFERENTIATED DUCTAL ADENOCARCINOMA SCANT LYMPHOID STROMA The tumor cells are negative for Her2 (1+). Estrogen Receptor: 90%, POSITIVE, STRONG STAINING INTENSITY Progesterone Receptor: 70%, POSITIVE, STRONG STAINING INTENSITY Proliferation Marker Ki67: 20% REFERENCE RANGE ESTROGEN RECEPTOR NEGATIVE 0% POSITIVE =>1% REFERENCE RANGE PROGESTERONE RECEPTOR NEGATIVE 0% POSITIVE =>1% All controls stained appropriately Zhaoli Lane MD Pathologist, Electronic Signature ( Signed 06/26/2022)   CLINICAL DATA:  57-year-old female presenting for evaluation of a palpable lump in the right breast.   EXAM: DIGITAL DIAGNOSTIC BILATERAL MAMMOGRAM WITH TOMOSYNTHESIS; ULTRASOUND RIGHT BREAST LIMITED; ULTRASOUND LEFT BREAST LIMITED   TECHNIQUE: Bilateral digital diagnostic mammography and breast tomosynthesis was performed.; Targeted ultrasound examination of the right breast was performed; Targeted ultrasound examination of the left breast was performed.   COMPARISON:  Previous exam(s).   ACR Breast Density Category c: The breasts are heterogeneously dense, which may obscure small masses.   FINDINGS: There is an ill-defined irregular mass involving the central to upper outer portion of the right breast. The right breast globally is smaller than the left side, and demonstrates   marked diffuse skin thickening. Skin thickening is noted along the medial aspect of the left breast as well, which may be an extension from the thickening on the right.   There is an asymmetry in the upper outer posterior left breast on the initial routine views. Spot compression tomosynthesis images through the upper outer posterior left breast demonstrates an improved appearance of this asymmetry, though ultrasound will be performed for further evaluation.   Physical exam of the right breast demonstrates that there is peau d'orange appearance of the  skin. The breast is smaller than the right and the entire breast is hard.   Ultrasound of the central to upper outer right breast demonstrates hypoechoic heterogeneous tissue. Due to the density of the breast and large ill-defined appearance of the mass, accurate measurements cannot be obtained sonographically. However the mass appears to be least 8-9 cm. Ultrasound of the right axilla demonstrates 2 abnormally thickened lymph nodes with cortices measuring 6-7 mm, and 1 borderline lymph node with a cortex of just over 3 mm.   Ultrasound of the upper-outer quadrant of the left breast demonstrates normal fibroglandular tissue. No suspicious masses are identified. Ultrasound of the left axilla demonstrates lymph nodes with cortices measuring within normal limits.   IMPRESSION: 1. There is a large mass in the right breast, which appears to measure 8-9 cm on ultrasound. Accurate measurement is difficult.   2. There are 2 abnormal right axillary lymph nodes, and a third borderline lymph node.   3. Skin thickening is noted along the medial aspect of the left breast, which may be an extension of the marked skin thickening diffusely on the right. Otherwise, no left breast abnormalities are identified.   RECOMMENDATION: 1. Ultrasound-guided biopsy is recommended for the right breast mass and one of the right axillary lymph nodes. The procedure has been scheduled for 06/22/2022 at 9:30 a.m.   2. Following biopsy, bilateral breast MRI is recommended to evaluate extent of disease in the right breast and to evaluate for any chest wall involvement. MRI would also be helpful to further screen the left breast given the skin thickening seen medially, to ensure that there are no suspicious abnormalities within the breast.   I have discussed the findings and recommendations with the patient. If applicable, a reminder letter will be sent to the patient regarding the next appointment.   BI-RADS  CATEGORY  5: Highly suggestive of malignancy.   Electronically Signed: By: Michelle  Collins M.D. On: 06/19/2022 12:37      Assessment and Plan:  Diagnoses and all orders for this visit:  Malignant neoplasm of upper-outer quadrant of right breast in female, estrogen receptor positive (CMS/HHS-HCC)     I had a long discussion with the other members of the treatment team as well as the patient and her husband.  Primary surgical options are limited due to the involvement of the skin.  There would be no skin to close over this large wound.  We will attempt neoadjuvant chemotherapy to try to downsize the tumor which may make surgical approach more amenable.  We will plan ultrasound guided port placement urgently to initiate chemotherapy.The surgical procedure has been discussed with the patient.  Potential risks, benefits, alternative treatments, and expected outcomes have been explained.  All of the patient's questions at this time have been answered.  The likelihood of reaching the patient's treatment goal is good.  The patient understand the proposed surgical procedure and wishes to proceed.  Even if the patient has some   response to neoadjuvant chemotherapy, she will likely require mastectomy and postmastectomy radiation in the future.  I do not recommend immediate reconstruction but the patient may be a candidate for delayed reconstruction.   Monica Soto Monica Zaynab Chipman, MD  07/04/2022 2:17 PM 

## 2022-07-04 NOTE — Progress Notes (Signed)
[facility] CONSULT NOTE  Patient Care Team: Assunta Found, MD as PCP - General (Family Medicine) Jena Gauss, Gerrit Friends, MD (Gastroenterology) Rachel Moulds, MD as Consulting Physician (Hematology and Oncology) Antony Blackbird, MD as Consulting Physician (Radiation Oncology) Manus Rudd, MD as Consulting Physician (General Surgery) Pershing Proud, RN as Oncology Nurse Navigator Donnelly Angelica, RN as Oncology Nurse Navigator  CHIEF COMPLAINTS/PURPOSE OF CONSULTATION:  Newly diagnosed breast cancer  HISTORY OF PRESENTING ILLNESS:  Monica Soto 57 y.o. female is here because of recent diagnosis of right sided breast cancer  I reviewed her records extensively and collaborated the history with the patient.  SUMMARY OF ONCOLOGIC HISTORY: Oncology History  Malignant neoplasm of upper-outer quadrant of right breast in female, estrogen receptor positive (HCC)  06/19/2022 Mammogram   Mammogram showed large mass in the right breast which appears to measure 8 to 9 cm on ultrasound, 2 abnormal right axillary lymph node and a third borderline lymph node.  Skin thickening noted along the medial aspect of left breast which may be an extension of the marked skin thickening diffusely on the right.  Otherwise no left breast abnormalities are identified.   06/22/2022 Pathology Results   Needle core biopsy from the right breast upper outer quadrant showed grade 2 IDC, both lymph nodes with metastatic adenocarcinoma.  Prognostic showed ER 90% positive strong staining PR 70% positive strong staining Ki-67 of 20% HER2 negative by IHC 1+   07/03/2022 Initial Diagnosis   Malignant neoplasm of upper-outer quadrant of right breast in female, estrogen receptor positive (HCC)   07/04/2022 Cancer Staging   Staging form: Breast, AJCC 8th Edition - Clinical: Stage IIIB (cT4, cN2, cM0, G2, ER+, PR+, HER2-) - Signed by Rachel Moulds, MD on 07/04/2022 Stage prefix: Initial diagnosis Histologic grading  system: 3 grade system   07/16/2022 -  Chemotherapy   Patient is on Treatment Plan : BREAST ADJUVANT DOSE DENSE AC q14d / PACLitaxel q7d      Monica Soto is here for an initial visit at the breast [facility].  She arrived today with her husband.  She tells me that about 3 months ago is when she started noticing changes in the skin of the right breast.  Prior to that she was feeling like a bee sting in sensation and itching in the right breast but she did not think much of it.  There is no family history of breast cancer.  Her last mammogram was in 2016.  She used birth control for about 16 to 18 years, no history of hormone replacement therapy.  She had 2 kids, no history of breast-feeding.  Besides the breast changes that she reports, she is healthy, smokes about a pack per day.  No other past medical history.  She had partial hysterectomy back many years ago hence does not have menstrual cycles but she thinks she is postmenopausal.  MEDICAL HISTORY:  Past Medical History:  Diagnosis Date   Depression    Erosive esophagitis 04/26/10   EGD by Dr. Jena Gauss, small hiatal hernia   GERD (gastroesophageal reflux disease)    History of stomach ulcers    IBS (irritable bowel syndrome)    Diarrhea predominant   Microscopic colitis 04/26/2010   Colonoscopy by Dr. Jena Gauss, good response with Entocort   Tubular adenoma of colon 04/26/10   Junction of descending and sigmoid 40 CM from anus    SURGICAL HISTORY: Past Surgical History:  Procedure Laterality Date   ABDOMINAL HYSTERECTOMY  APPENDECTOMY  2/11   Dr. Malvin Johns with a delayed closure   BREAST BIOPSY Right 06/22/2022   [place] RT BREAST BX W LOC DEV 1ST LESION IMG BX SPEC US GUIDE 06/22/2022 [facility]   TONSILLECTOMY     TUBAL LIGATION      SOCIAL HISTORY: Social History   Socioeconomic History   Marital status: Married    Spouse name: Not on file   Number of children: Not on file   Years of education: Not on file   Highest education level:  Not on file  Occupational History    Employer: [employer]    Comment: Third shift  Tobacco Use   Smoking status: Every Day    Packs/day: .5    Types: Cigarettes   Smokeless tobacco: Never  Vaping Use   Vaping Use: Never used  Substance and Sexual Activity   Alcohol use: Yes    Alcohol/week: 1.0 standard drink of alcohol    Types: 1 Glasses of wine per week    Comment: seldom   Drug use: No   Sexual activity: Yes    Birth control/protection: Surgical  Other Topics Concern   Not on file  Social History Narrative   Not on file   Social Determinants of Health   Financial Resource Strain: Not on file  Food Insecurity: Not on file  Transportation Needs: Not on file  Physical Activity: Not on file  Stress: Not on file  Social Connections: Not on file  Intimate Partner Violence: Not on file    FAMILY HISTORY: Family History  Problem Relation Age of Onset   Diabetes Mother    Coronary artery disease Mother    Healthy Father     ALLERGIES:  has No Known Allergies.  MEDICATIONS:  Current Outpatient Medications  Medication Sig Dispense Refill   Aspirin-Acetaminophen-Caffeine (GOODY HEADACHE PO) Take 1 packet by mouth 2 (two) times daily as needed (for pain).     benzonatate (TESSALON) 100 MG capsule Take 1-2 capsules (100-200 mg total) by mouth 3 (three) times daily as needed for cough. Do not take with alcohol or while driving or operating heavy machinery.  May cause drowsiness. 60 capsule 0   cetirizine (ZYRTEC ALLERGY) 10 MG tablet Take 1 tablet (10 mg total) by mouth daily. 30 tablet 2   cyclobenzaprine (FLEXERIL) 10 MG tablet Take 1 tablet (10 mg total) by mouth 3 (three) times daily as needed for muscle spasms. 30 tablet 0   dexamethasone (DECADRON) 4 MG tablet Take 2 tablets (8 mg total) by mouth daily for 3 days. Start the day after doxorubicin/cyclophosphamide chemotherapy. Take with food. 30 tablet 1   diphenhydrAMINE (BENADRYL) 25 mg capsule Take 25 mg by mouth  every 6 (six) hours as needed for itching.      fluticasone (FLONASE) 50 MCG/ACT nasal spray Place 2 sprays into both nostrils daily. 16 g 6   lidocaine-prilocaine (EMLA) cream Apply to affected area once 30 g 3   naproxen (NAPROSYN) 500 MG tablet Take 1 tablet (500 mg total) by mouth 2 (two) times daily with a meal. 30 tablet 0   ondansetron (ZOFRAN) 8 MG tablet Take 1 tab (8 mg) by mouth every 8 hrs as needed for nausea/vomiting. Start third day after doxorubicin/cyclophosphamide chemotherapy. 30 tablet 1   prochlorperazine (COMPAZINE) 10 MG tablet Take 1 tablet (10 mg total) by mouth every 6 (six) hours as needed for nausea or vomiting. 30 tablet 1   promethazine-dextromethorphan (PROMETHAZINE-DM) 6.25-15 MG/5ML syrup Take  5 mLs by mouth at bedtime as needed for cough. Do not take with alcohol or while driving or operating heavy machinery.  May cause drowsiness. 100 mL 0   No current facility-administered medications for this visit.   PHYSICAL EXAMINATION: ECOG PERFORMANCE STATUS: 1 - Symptomatic but completely ambulatory  Vitals:   07/04/22 0858  BP: 127/84  Pulse: 82  Resp: 18  Temp: (!) 97.4 F (36.3 C)  SpO2: 97%   Filed Weights   07/04/22 0858  Weight: 170 lb 8 oz (77.3 kg)    GENERAL:alert, no distress and comfortable Neck: No cervical adenopathy or supraclavicular adenopathy Breast: Right breast with large fixed breast mass pretty much occupying the entire right breast extending into the midline with macroscopic nodules.  Skin appears to be involved.  No ulceration noted.  Right axillary lymph nodes appear to be fixed.  No palpable masses in the left breast No lower extremity edema  LABORATORY DATA:  I have reviewed the data as listed Lab Results  Component Value Date   WBC 6.4 07/04/2022   HGB 14.6 07/04/2022   HCT 42.8 07/04/2022   MCV 89.7 07/04/2022   PLT 242 07/04/2022   Lab Results  Component Value Date   NA 140 07/04/2022   K 3.9 07/04/2022   CL 106  07/04/2022   CO2 28 07/04/2022    RADIOGRAPHIC STUDIES: I have personally reviewed the radiological reports and agreed with the findings in the report.  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  Given large tumor size, extensive skin involvement and fixed lymphadenopathy, she cannot be operated upfront.  Hence despite intermediate grade with strong ER/PR positivity it is reasonable to consider neoadjuvant chemotherapy to downsize the tumor to make the surgery amenable.  We have discussed that she may not achieve great response given her prognostics however the goal is to downsize the tumor if at all possible for it to be operable.  We have discussed about neoadjuvant dose dense AC-T.  We have discussed the mechanism of action, adverse effects of chemotherapy including but not limited to fatigue, nausea, vomiting, diarrhea, constipation, increased risk of infections, cytopenias, neuropathy and cardiac toxicity. Baseline echocardiogram ordered.  Given locally advanced breast cancer it is reasonable to proceed with systemic staging prior to neoadjuvant chemotherapy hence I recommended a PET/CT.  After chemotherapy and surgery, she will likely need postmastectomy radiation.  After radiation she will need adjuvant antiestrogen therapy likely for 10 years and adjuvant abemaciclib for 2 years.  All her questions were answered to the best my knowledge.  Thank you for consulting [place] in the care of this patient.  Please not hesitate to contact us with any additional questions or concerns.  I have also discussed about the research studies we have available and she is interested in  discussing with the research coordinator. Total time spent: 60 minutes including history, physical exam, review of records, counseling and coordination of care All questions were answered. The patient knows to call the clinic with any problems, questions or concerns.    Rachel Moulds, MD 07/04/22

## 2022-07-04 NOTE — Therapy (Signed)
OUTPATIENT PHYSICAL THERAPY BREAST CANCER BASELINE EVALUATION   Patient Name: Monica Soto MRN: [id number] DOB:07-01-1965, 57 y.o., female Today's Date: 07/04/2022  END OF SESSION:  PT End of Session - 07/04/22 0914     Visit Number 1    Number of Visits 2    Date for PT Re-Evaluation 01/04/23    PT Start Time 0938    PT Stop Time 1003   Also saw pt from 1037 to 1050 for a total of 38 minutes   PT Time Calculation (min) 25 min    Activity Tolerance Patient tolerated treatment well    Behavior During Therapy [facility] for tasks assessed/performed             Past Medical History:  Diagnosis Date   Depression    Erosive esophagitis 04/26/10   EGD by Dr. Jena Gauss, small hiatal hernia   GERD (gastroesophageal reflux disease)    History of stomach ulcers    IBS (irritable bowel syndrome)    Diarrhea predominant   Microscopic colitis 04/26/2010   Colonoscopy by Dr. Jena Gauss, good response with Entocort   Tubular adenoma of colon 04/26/10   Junction of descending and sigmoid 40 CM from anus   Past Surgical History:  Procedure Laterality Date   ABDOMINAL HYSTERECTOMY     APPENDECTOMY  2/11   Dr. Malvin Johns with a delayed closure   BREAST BIOPSY Right 06/22/2022   [place] RT BREAST BX W LOC DEV 1ST LESION IMG BX SPEC US GUIDE 06/22/2022 [facility]   TONSILLECTOMY     TUBAL LIGATION     Patient Active Problem List   Diagnosis Date Noted   Malignant neoplasm of upper-outer quadrant of right breast in female, estrogen receptor positive (HCC) 07/03/2022   Lymphocytic colitis 05/30/2010   GERD 04/17/2010   IRRITABLE BOWEL SYNDROME 02/14/2010   DIARRHEA 02/14/2010   ABDOMINAL PAIN, MILD 02/14/2010    REFERRING PROVIDER: Dr. Manus Rudd  REFERRING DIAG: Right breast cancer  THERAPY DIAG:  Malignant neoplasm of upper-outer quadrant of right breast in female, estrogen receptor positive (HCC)  Localized edema  Abnormal posture  Rationale for Evaluation and Treatment:  Rehabilitation  ONSET DATE: 06/19/2022  SUBJECTIVE:                                                                                                                                                                                           SUBJECTIVE STATEMENT: Patient reports she is here today to be seen by her medical team for her newly diagnosed right breast cancer.   PERTINENT HISTORY:  Patient was diagnosed on 06/19/2022 with right grade 2 invasive ductal  carcinoma breast cancer. It measures 14 cm and is located in the upper outer quadrant. It is ER/PR positive and HER2 negative  with a Ki67 of 20%. She has 2 axillary nodes that were biopsied and found to be positive. She has right arm swelling.  PATIENT GOALS:   reduce lymphedema risk and learn post op HEP.   PAIN:  Are you having pain? Yes: NPRS scale: varies/10 Pain location: right axilla Pain description: Bee sting Aggravating factors: Nothing; it's random Relieving factors: nothing  PRECAUTIONS: Active CA Other: currently has lymphedema as a result of lymphatic fluid blockage in axilla  HAND DOMINANCE: right  WEIGHT BEARING RESTRICTIONS: No  FALLS:  Has patient fallen in last 6 months? No  LIVING ENVIRONMENT: Patient lives with: her husband and 57 yo. daughter Lives in: House/apartment Has following equipment at home: None  OCCUPATION: works in Curator at [employer]: She does not exercise  PRIOR LEVEL OF FUNCTION: Independent   OBJECTIVE:  COGNITION: Overall cognitive status: Within functional limits for tasks assessed    POSTURE:  Forward head and rounded shoulders posture  UPPER EXTREMITY AROM/PROM:  A/PROM RIGHT   eval   Shoulder extension 52  Shoulder flexion 149  Shoulder abduction 150  Shoulder internal rotation 72  Shoulder external rotation 90    (Blank rows = not tested)  A/PROM LEFT   eval  Shoulder extension 56  Shoulder flexion 136  Shoulder abduction 152  Shoulder  internal rotation 68  Shoulder external rotation 84    (Blank rows = not tested)  CERVICAL AROM: All within normal limits  UPPER EXTREMITY STRENGTH: WFL  LYMPHEDEMA ASSESSMENTS:   LANDMARK RIGHT   eval  10 cm proximal to olecranon process 30.1  Olecranon process 26.9  10 cm proximal to ulnar styloid process 25  Just proximal to ulnar styloid process 18.8  Across hand at thumb web space 21.8  At base of 2nd digit 7.6  (Blank rows = not tested)  LANDMARK LEFT   eval  10 cm proximal to olecranon process 29  Olecranon process 26.1  10 cm proximal to ulnar styloid process 22.2  Just proximal to ulnar styloid process 17.2  Across hand at thumb web space 20.4  At base of 2nd digit 6.8  (Blank rows = not tested)  L-DEX LYMPHEDEMA SCREENING:  The patient was assessed using the L-Dex machine today to produce a lymphedema index baseline score. The patient will be reassessed on a regular basis (typically every 3 months) to obtain new L-Dex scores. If the score is > 6.5 points away from his/her baseline score indicating onset of subclinical lymphedema, it will be recommended to wear a compression garment for 4 weeks, 12 hours per day and then be reassessed. If the score continues to be > 6.5 points from baseline at reassessment, we will initiate lymphedema treatment. Assessing in this manner has a 95% rate of preventing clinically significant lymphedema.   L-DEX FLOWSHEETS - 07/04/22 0900       L-DEX LYMPHEDEMA SCREENING   Measurement Type Unilateral    L-DEX MEASUREMENT EXTREMITY Upper Extremity    POSITION  Standing    DOMINANT SIDE Right    At Risk Side Right    BASELINE SCORE (UNILATERAL) 37.6           L-Dex score is not within normal limits due to right arm swelling from tumor invasion of lymphatics.  QUICK DASH SURVEY:  Neldon Mc - 07/04/22 0001     Open  a tight or new jar No difficulty    Do heavy household chores (wash walls, wash floors) No difficulty    Carry a  shopping bag or briefcase No difficulty    Wash your back No difficulty    Use a knife to cut food No difficulty    Recreational activities in which you take some force or impact through your arm, shoulder, or hand (golf, hammering, tennis) No difficulty    During the past week, to what extent has your arm, shoulder or hand problem interfered with your normal social activities with family, friends, neighbors, or groups? Not at all    During the past week, to what extent has your arm, shoulder or hand problem limited your work or other regular daily activities Slightly    Arm, shoulder, or hand pain. Moderate    Tingling (pins and needles) in your arm, shoulder, or hand None    DASH Score 4.55 %              PATIENT EDUCATION:  Education details: Lymphedema risk reduction and post op shoulder/posture HEP Person educated: Patient Education method: Explanation, Demonstration, Handout Education comprehension: Patient verbalized understanding and returned demonstration  HOME EXERCISE PROGRAM: Patient was instructed today in a home exercise program today for post op shoulder range of motion. These included active assist shoulder flexion in sitting, scapular retraction, wall walking with shoulder abduction, and hands behind head external rotation.  She was encouraged to do these twice a day, holding 3 seconds and repeating 5 times when permitted by her physician.   ASSESSMENT:  CLINICAL IMPRESSION: Patient was diagnosed on 06/19/2022 with right grade 2 invasive ductal carcinoma breast cancer. It measures 14 cm and is located in the upper outer quadrant. It is ER/PR positive and HER2 negative  with a Ki67 of 20%. She has right arm swelling, likely due to positive axillary lymph nodes.Her multidisciplinary medical team met prior to her assessments to determine a recommended treatment plan. She is planning to have neoadjuvant chemotherapy followed by a mastectomy and targeted axillary lymph node  dissection, radiation, and anti-estrogen therapy. She will benefit from a post op PT reassessment to determine needs and from L-Dex screens every 3 months for 2 years to detect subclinical lymphedema.  Pt will benefit from skilled therapeutic intervention to improve on the following deficits: Decreased knowledge of precautions, impaired UE functional use, pain, decreased ROM, postural dysfunction.   PT treatment/interventions: ADL/self-care home management, pt/family education, therapeutic exercise  REHAB POTENTIAL: Good  CLINICAL DECISION MAKING: Stable/uncomplicated  EVALUATION COMPLEXITY: Low   GOALS: Goals reviewed with patient? YES  LONG TERM GOALS: (STG=LTG)    Name Target Date Goal status  1 Pt will be able to verbalize understanding of pertinent lymphedema risk reduction practices relevant to her dx specifically related to skin care.  Baseline:  No knowledge 07/04/2022 Achieved at eval  2 Pt will be able to return demo and/or verbalize understanding of the post op HEP related to regaining shoulder ROM. Baseline:  No knowledge 07/04/2022 Achieved at eval  3 Pt will be able to verbalize understanding of the importance of attending the post op After Breast CA Class for further lymphedema risk reduction education and therapeutic exercise.  Baseline:  No knowledge 07/04/2022 Achieved at eval  4 Pt will demo she has regained full shoulder ROM and function post operatively compared to baselines.  Baseline: See objective measurements taken today. 01/04/2023     PLAN:  PT FREQUENCY/DURATION: EVAL and 1 follow  up appointment.   PLAN FOR NEXT SESSION: will reassess 3-4 weeks post op to determine needs.   Patient will follow up at outpatient cancer rehab 3-4 weeks following surgery.  If the patient requires physical therapy at that time, a specific plan will be dictated and sent to the referring physician for approval. The patient was educated today on appropriate basic range of motion  exercises to begin post operatively and the importance of attending the After Breast Cancer class following surgery.  Patient was educated today on lymphedema risk reduction practices as it pertains to recommendations that will benefit the patient immediately following surgery.  She verbalized good understanding.    Physical Therapy Information for After Breast Cancer Surgery/Treatment:  Lymphedema is a swelling condition that you may be at risk for in your arm if you have lymph nodes removed from the armpit area.  After a sentinel node biopsy, the risk is approximately 5-9% and is higher after an axillary node dissection.  There is treatment available for this condition and it is not life-threatening.  Contact your physician or physical therapist with concerns. You may begin the 4 shoulder/posture exercises (see additional sheet) when permitted by your physician (typically a week after surgery).  If you have drains, you may need to wait until those are removed before beginning range of motion exercises.  A general recommendation is to not lift your arms above shoulder height until drains are removed.  These exercises should be done to your tolerance and gently.  This is not a "no pain/no gain" type of recovery so listen to your body and stretch into the range of motion that you can tolerate, stopping if you have pain.  If you are having immediate reconstruction, ask your plastic surgeon about doing exercises as he or she may want you to wait. We encourage you to attend the free one time ABC (After Breast Cancer) class offered by [facility].  You will learn information related to lymphedema risk, prevention and treatment and additional exercises to regain mobility following surgery.  You can call [phone] for more information.  This is offered the 1st and 3rd Monday of each month.  You only attend the class one time. While undergoing any medical procedure or treatment, try to  avoid blood pressure being taken or needle sticks from occurring on the arm on the side of cancer.   This recommendation begins after surgery and continues for the rest of your life.  This may help reduce your risk of getting lymphedema (swelling in your arm). An excellent resource for those seeking information on lymphedema is the National Lymphedema Network's web site. It can be accessed at www.lymphnet.org If you notice swelling in your hand, arm or breast at any time following surgery (even if it is many years from now), please contact your doctor or physical therapist to discuss this.  Lymphedema can be treated at any time but it is easier for you if it is treated early on.  If you feel like your shoulder motion is not returning to normal in a reasonable amount of time, please contact your surgeon or physical therapist.  [facility] [phone]. [address], [place] [address]  ABC CLASS After Breast Cancer Class  After Breast Cancer Class is a specially designed exercise class to assist you in a safe recover after having breast cancer surgery.  In this class you will learn how to get back to full function  whether your drains were just removed or if you had surgery a month ago.  This one-time class is held the 1st and 3rd Monday of every month from 11:00 a.m. until 12:00 noon virtually.  This class is FREE and space is limited. For more information or to register for the next available class, call [phone].  Class Goals  Understand specific stretches to improve the flexibility of you chest and shoulder. Learn ways to safely strengthen your upper body and improve your posture. Understand the warning signs of infection and why you may be at risk for an arm infection. Learn about Lymphedema and prevention.  ** You do not attend this class until after surgery.  Drains must be removed to participate  Patient was instructed today in a home  exercise program today for post op shoulder range of motion. These included active assist shoulder flexion in sitting, scapular retraction, wall walking with shoulder abduction, and hands behind head external rotation.  She was encouraged to do these twice a day, holding 3 seconds and repeating 5 times when permitted by her physician.  Bethann Punches, [place] 07/04/22 11:29 AM

## 2022-07-04 NOTE — Research (Signed)
W0981, ICE COMPRESS: RANDOMIZED TRIAL OF LIMB CRYOCOMPRESSION VERSUS CONTINUOUS COMPRESSION VERSUS LOW CYCLIC COMPRESSION FOR THE PREVENTION  OF TAXANE-INDUCED PERIPHERAL NEUROPATHY  Patient Monica Soto was identified by Dr Al Pimple as a potential candidate for the above listed study.  This Clinical Research Nurse met with Monica Soto, [id number], on 07/04/22 in a manner and location that ensures patient privacy to discuss participation in the above listed research study.  Patient is Accompanied by her husband .  A copy of the informed consent document with embedded HIPAA language was provided to the patient.  Patient reads, speaks, and understands [place].   Patient was provided with the business card of this Nurse and encouraged to contact the research team with any questions.  Approximately 15 minutes were spent with the patient reviewing the informed consent documents.  Patient was provided the option of taking informed consent documents home to review and was encouraged to review at their convenience with their support network, including other care providers. Patient took the consent documents home to review.  Plan f/u by phone on Monday 07/09/22. Confirmed phone number [phone].  Margret Chance Rea Kalama, RN, BSN, [facility] She  Her  Hers Clinical Research Nurse [facility] Direct Dial [phone]  Pager [phone] 07/04/2022 12:14 PM

## 2022-07-04 NOTE — Assessment & Plan Note (Signed)
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  Given large tumor size, extensive skin involvement and fixed lymphadenopathy, she cannot be operated upfront.  Hence despite intermediate grade with strong ER/PR positivity it is reasonable to consider neoadjuvant chemotherapy to downsize the tumor to make the surgery amenable.  We have discussed that she may not achieve great response given her prognostics however the goal is to downsize the tumor if at all possible for it to be operable.  We have discussed about neoadjuvant dose dense AC-T.  We have discussed the mechanism of action, adverse effects of chemotherapy including but not limited to fatigue, nausea, vomiting, diarrhea, constipation, increased risk of infections, cytopenias, neuropathy and cardiac toxicity. Baseline echocardiogram ordered.  Given locally advanced breast cancer it is reasonable to proceed with systemic staging prior to neoadjuvant chemotherapy hence I recommended a PET/CT.  After chemotherapy and surgery, she will likely need postmastectomy radiation.  After radiation she will need adjuvant antiestrogen therapy likely for 10 years and adjuvant abemaciclib for 2 years.  All her questions were answered to the best my knowledge.  Thank you for consulting [place] in the care of this patient.  Please not hesitate to contact us with any additional questions or concerns.

## 2022-07-04 NOTE — Progress Notes (Signed)
[facility] Clinical Social Work  Initial Assessment   Monica Soto is a 57 y.o. year old female accompanied by husband, Thayer Ohm. Clinical Social Work was referred by  [facility]  for assessment of psychosocial needs.   SDOH (Social Determinants of Health) assessments performed: Yes SDOH Interventions    Flowsheet Row Clinical Support from 07/04/2022 in [facility] at [facility]  SDOH Interventions   Food Insecurity Interventions Intervention Not Indicated  Housing Interventions Intervention Not Indicated  Transportation Interventions Intervention Not Indicated       SDOH Screenings   Food Insecurity: No Food Insecurity (07/04/2022)  Housing: Low Risk  (07/04/2022)  Transportation Needs: No Transportation Needs (07/04/2022)  Tobacco Use: High Risk (07/04/2022)     Distress Screen completed: Yes    07/04/2022    1:00 PM  ONCBCN DISTRESS SCREENING  Screening Type Initial Screening  Distress experienced in past week (1-10) 4  Emotional problem type Nervousness/Anxiety;Adjusting to illness  Physical Problem type Swollen arms/legs      Family/Social Information:  Housing Arrangement: patient lives with husband. 24yo daughter at home currently. 28yo son lives nearby Family members/support persons in your life? Family Transportation concerns: no  Employment: Working full time as Therapist, music. Has FMLA and short-term disability benefits  Income source: Employment, husband's employment Financial concerns: No Type of concern: None Food access concerns: no Services Currently in place:  [facility]. Will be bringing documents for FMLA for pt/spouse   Coping/ Adjustment to diagnosis: Patient understands treatment plan and what happens next? yes, patient is viewing this as something to get done and does not want people to be sad around her. Husband is trying to support while also processing what he is feeling Patient reported stressors: Anxiety/ nervousness and Adjusting to my  illness Patient enjoys watching TV Advice worker & Order) Current coping skills/ strengths: Capable of independent living , Motivation for treatment/growth , and Supportive family/friends     SUMMARY: Current SDOH Barriers:  No major barriers today  Clinical Social Work Clinical Goal(s):  No clinical social work goals at this time  Interventions: Discussed common feeling and emotions when being diagnosed with cancer, and the importance of support during treatment Informed patient of the support team roles and support services at [facility] Provided CSW contact information and encouraged patient to call with any questions or concerns   Follow Up Plan: Patient will contact CSW with any support or resource needs. CSW will plan to check in with pt during treatment Patient verbalizes understanding of plan: Yes    Anna Beaird E Shelton Soler, LCSW Clinical Social Worker [facility]

## 2022-07-05 ENCOUNTER — Ambulatory Visit (HOSPITAL_COMMUNITY)
Admission: RE | Admit: 2022-07-05 | Discharge: 2022-07-05 | Disposition: A | Discharge status: Home / Self Care | Payer: 59 | Source: Ambulatory Visit | Attending: Hematology and Oncology | Admitting: Hematology and Oncology

## 2022-07-05 ENCOUNTER — Other Ambulatory Visit: Payer: Self-pay

## 2022-07-05 DIAGNOSIS — F17200 Nicotine dependence, unspecified, uncomplicated: Secondary | ICD-10-CM | POA: Insufficient documentation

## 2022-07-05 DIAGNOSIS — C50411 Malignant neoplasm of upper-outer quadrant of right female breast: Secondary | ICD-10-CM

## 2022-07-05 DIAGNOSIS — I517 Cardiomegaly: Secondary | ICD-10-CM | POA: Insufficient documentation

## 2022-07-05 DIAGNOSIS — Z01818 Encounter for other preprocedural examination: Secondary | ICD-10-CM | POA: Insufficient documentation

## 2022-07-05 DIAGNOSIS — I5030 Unspecified diastolic (congestive) heart failure: Secondary | ICD-10-CM

## 2022-07-05 DIAGNOSIS — Z170 Estrogen receptor positive status [ER+]: Secondary | ICD-10-CM

## 2022-07-05 LAB — ECHOCARDIOGRAM COMPLETE
AR max vel: 3.12 cm2
AV Area VTI: 3.14 cm2
AV Area mean vel: 3.07 cm2
AV Mean grad: 4 mmHg
AV Peak grad: 6.7 mmHg
Ao pk vel: 1.29 m/s
Area-P 1/2: 7.16 cm2
Calc EF: 67.6 %
MV VTI: 4.54 cm2
S' Lateral: 2.6 cm
Single Plane A2C EF: 66.1 %
Single Plane A4C EF: 61.7 %

## 2022-07-06 ENCOUNTER — Other Ambulatory Visit: Payer: Self-pay

## 2022-07-06 ENCOUNTER — Telehealth: Payer: Self-pay | Admitting: Hematology and Oncology

## 2022-07-06 NOTE — Telephone Encounter (Signed)
Spoke with patient confirming upcoming appointment  

## 2022-07-09 ENCOUNTER — Encounter (HOSPITAL_BASED_OUTPATIENT_CLINIC_OR_DEPARTMENT_OTHER): Payer: Self-pay | Admitting: Surgery

## 2022-07-09 ENCOUNTER — Other Ambulatory Visit: Payer: Self-pay

## 2022-07-09 ENCOUNTER — Telehealth: Payer: Self-pay

## 2022-07-09 NOTE — Telephone Encounter (Signed)
[employer] 2021-05 - Specimen Collection Study to Evaluate Biomarkers in Subjects with Cancer   S2205, ICE COMPRESS: RANDOMIZED TRIAL OF LIMB CRYOCOMPRESSION VERSUS CONTINUOUS COMPRESSION VERSUS LOW CYCLIC COMPRESSION FOR THE PREVENTION  OF TAXANE-INDUCED PERIPHERAL NEUROPATHY  Called patient to follow up on BMDC study introduction. Patient is interested in both studies and will be available to consent following chemo ed on Friday 09/12/22 at 1300. Waiting on clarification from Dr Al Pimple on chemo regimen to confirm she is eligible for S2205. Plan blood draw at her first chemo on Tuesday 09/16/22.  Margret Chance Raylene Carmickle, RN, BSN, [facility] She  Her  Hers Clinical Research Nurse [facility] Direct Dial [phone]  Pager [phone] 07/09/2022 4:09 PM

## 2022-07-10 NOTE — Progress Notes (Signed)
Pharmacist Chemotherapy Monitoring - Initial Assessment    Anticipated start date: 07/17/22   The following has been reviewed per standard work regarding the patient's treatment regimen: The patient's diagnosis, treatment plan and drug doses, and organ/hematologic function Lab orders and baseline tests specific to treatment regimen  The treatment plan start date, drug sequencing, and pre-medications Prior authorization status  Patient's documented medication list, including drug-drug interaction screen and prescriptions for anti-emetics and supportive care specific to the treatment regimen The drug concentrations, fluid compatibility, administration routes, and timing of the medications to be used The patient's access for treatment and lifetime cumulative dose history, if applicable  The patient's medication allergies and previous infusion related reactions, if applicable   Changes made to treatment plan:  N/A  Follow up needed:  Port placement sched 07/16/22   Ebony Hail, Pharm.D., CPP 07/10/2022@4 :22 PM

## 2022-07-12 ENCOUNTER — Encounter: Payer: Self-pay | Admitting: *Deleted

## 2022-07-12 ENCOUNTER — Encounter: Payer: Self-pay | Admitting: Genetic Counselor

## 2022-07-12 ENCOUNTER — Telehealth: Payer: Self-pay | Admitting: *Deleted

## 2022-07-12 ENCOUNTER — Telehealth: Payer: Self-pay | Admitting: Genetic Counselor

## 2022-07-12 ENCOUNTER — Encounter (HOSPITAL_COMMUNITY)
Admission: RE | Admit: 2022-07-12 | Discharge: 2022-07-12 | Disposition: A | Discharge status: Home / Self Care | Payer: 59 | Source: Ambulatory Visit | Attending: Hematology and Oncology | Admitting: Hematology and Oncology

## 2022-07-12 DIAGNOSIS — Z170 Estrogen receptor positive status [ER+]: Secondary | ICD-10-CM | POA: Insufficient documentation

## 2022-07-12 DIAGNOSIS — Z1379 Encounter for other screening for genetic and chromosomal anomalies: Secondary | ICD-10-CM | POA: Insufficient documentation

## 2022-07-12 DIAGNOSIS — C50411 Malignant neoplasm of upper-outer quadrant of right female breast: Secondary | ICD-10-CM | POA: Diagnosis present

## 2022-07-12 MED ORDER — FLUDEOXYGLUCOSE F - 18 (FDG) INJECTION
8.4000 | Freq: Once | INTRAVENOUS | Status: AC | PRN
  Administered 2022-07-12: 8.4 via INTRAVENOUS

## 2022-07-12 NOTE — Telephone Encounter (Signed)
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 There was no voicemail box.  Lalla Brothers, MS, [facility] Genetic Counselor [email or web address] (P) [phone]

## 2022-07-12 NOTE — Telephone Encounter (Signed)
Spoke with patient to follow up from [facility] 5/29 and assess navigation needs. Patient denies any questions or concerns at this time.  Encouraged her to call should anything arise. Patient verbalized understanding.

## 2022-07-13 ENCOUNTER — Ambulatory Visit: Payer: Self-pay | Admitting: Surgery

## 2022-07-13 ENCOUNTER — Inpatient Hospital Stay: Payer: 59 | Admitting: Radiology

## 2022-07-13 ENCOUNTER — Encounter: Payer: 59 | Admitting: Obstetrics & Gynecology

## 2022-07-13 ENCOUNTER — Inpatient Hospital Stay: Payer: 59 | Attending: Hematology and Oncology

## 2022-07-13 ENCOUNTER — Other Ambulatory Visit: Payer: Self-pay

## 2022-07-13 ENCOUNTER — Ambulatory Visit (HOSPITAL_COMMUNITY)
Admission: RE | Admit: 2022-07-13 | Discharge: 2022-07-13 | Disposition: A | Discharge status: Home / Self Care | Payer: 59 | Source: Ambulatory Visit | Attending: Hematology and Oncology | Admitting: Hematology and Oncology

## 2022-07-13 DIAGNOSIS — M533 Sacrococcygeal disorders, not elsewhere classified: Secondary | ICD-10-CM | POA: Insufficient documentation

## 2022-07-13 DIAGNOSIS — R234 Changes in skin texture: Secondary | ICD-10-CM | POA: Insufficient documentation

## 2022-07-13 DIAGNOSIS — Z170 Estrogen receptor positive status [ER+]: Secondary | ICD-10-CM | POA: Diagnosis present

## 2022-07-13 DIAGNOSIS — F17210 Nicotine dependence, cigarettes, uncomplicated: Secondary | ICD-10-CM | POA: Insufficient documentation

## 2022-07-13 DIAGNOSIS — C7951 Secondary malignant neoplasm of bone: Secondary | ICD-10-CM | POA: Insufficient documentation

## 2022-07-13 DIAGNOSIS — Z90710 Acquired absence of both cervix and uterus: Secondary | ICD-10-CM | POA: Insufficient documentation

## 2022-07-13 DIAGNOSIS — Z79811 Long term (current) use of aromatase inhibitors: Secondary | ICD-10-CM | POA: Insufficient documentation

## 2022-07-13 DIAGNOSIS — M25552 Pain in left hip: Secondary | ICD-10-CM | POA: Insufficient documentation

## 2022-07-13 DIAGNOSIS — Z9049 Acquired absence of other specified parts of digestive tract: Secondary | ICD-10-CM | POA: Insufficient documentation

## 2022-07-13 DIAGNOSIS — Z8249 Family history of ischemic heart disease and other diseases of the circulatory system: Secondary | ICD-10-CM | POA: Insufficient documentation

## 2022-07-13 DIAGNOSIS — Z79899 Other long term (current) drug therapy: Secondary | ICD-10-CM | POA: Insufficient documentation

## 2022-07-13 DIAGNOSIS — C50411 Malignant neoplasm of upper-outer quadrant of right female breast: Secondary | ICD-10-CM | POA: Insufficient documentation

## 2022-07-13 DIAGNOSIS — G479 Sleep disorder, unspecified: Secondary | ICD-10-CM | POA: Insufficient documentation

## 2022-07-13 DIAGNOSIS — Z86010 Personal history of colonic polyps: Secondary | ICD-10-CM | POA: Insufficient documentation

## 2022-07-13 DIAGNOSIS — R519 Headache, unspecified: Secondary | ICD-10-CM | POA: Insufficient documentation

## 2022-07-13 DIAGNOSIS — Z833 Family history of diabetes mellitus: Secondary | ICD-10-CM | POA: Insufficient documentation

## 2022-07-13 MED ORDER — GADOBUTROL 1 MMOL/ML IV SOLN
7.0000 mL | Freq: Once | INTRAVENOUS | Status: AC | PRN
  Administered 2022-07-13: 7 mL via INTRAVENOUS

## 2022-07-13 NOTE — Research (Signed)
[employer] 2021-05 - Specimen Collection Study to Evaluate Biomarkers in Subjects with Cancer    07/13/22  ELIGIBILITY:  This Coordinator has reviewed this patient's inclusion and exclusion criteria and confirmed Monica Soto is eligible for study participation.  Patient will continue with enrollment.  Menopausal status (women only): Monica Soto has had a hysterectomy.   Eligibility confirmed by treating investigator, who also agrees that patient should proceed with enrollment.   CONSENT: Patient was previously provided a copy of consent documents. Patient Monica Soto was identified by Dr. Al Pimple as a potential candidate for the above listed study.  This Clinical Research Coordinator met with Monica Soto, Monica Soto on 07/13/22 in a manner and location that ensures patient privacy to discuss participation in the above listed research study.  Patient is Accompanied by her husband .  Patient was previously provided with informed consent documents.  Patient confirmed they have read the informed consent documents.  As outlined in the informed consent form, this Coordinator and Monica Soto discussed the purpose of the research study, the investigational nature of the study, study procedures and requirements for study participation, potential risks and benefits of study participation, as well as alternatives to participation.  This study is not blinded or double-blinded. The patient understands participation is voluntary and they may withdraw from study participation at any time.  This study does not involve randomization.  This study does not involve an investigational drug or device. This study does not involve a placebo. Patient understands enrollment is pending full eligibility review.   Confidentiality and how the patient's information will be used as part of study participation were discussed.  Patient was informed there is reimbursement provided for their time and effort spent on trial  participation.  The patient is encouraged to discuss research study participation with their insurance provider to determine what costs they may incur as part of study participation, including research related injury.    All questions were answered to patient's satisfaction.  The informed consent with embedded HIPAA language was reviewed page by page.  The patient's mental and emotional status is appropriate to provide informed consent, and the patient verbalizes an understanding of study participation.  Patient has agreed to participate in the above listed research study and has voluntarily signed the informed consent version 19 Feb 2020 with embedded HIPAA language, version dated 19 Feb 2020  on 07/13/22 at 145 PM.  The patient was provided with a copy of the signed informed consent form with embedded HIPAA language for their reference.  No study specific procedures were obtained prior to the signing of the informed consent document.  Approximately 25 minutes were spent with the patient reviewing the informed consent documents.  Patient was not requested to complete a Release of Information form.   After completion of consent, patient provided medical history as follows:   Medical History:  High Blood Pressure  No Coronary Artery Disease No Lupus    No Rheumatoid Arthritis  No Diabetes   No      Lynch Syndrome  No  Is the patient currently taking a magnesium supplement?   No  Does the patient have a personal history of cancer (greater than 5 years ago)?  No  Does the patient have a family history of cancer in 1st or 2nd degree relatives? No  Does the patient have history of alcohol consumption? Yes   If yes, current or former? current Number of years? 28 (since the age of 77)  Drinks per week? Less than 1  Does the patient have history of cigarette, cigar, pipe, or chewing tobacco use?  Yes  If yes, current for former? current If yes, type (Cigarette, cigar, pipe, and/or chewing tobacco)?  cigarette   Number of years? 30 Packs/number/containers per day? 1/2   Patient was thanked for her time and support of the above mentioned study. Plan is to complete blood collection just prior to her first treatment early next week.   Merri Brunette, RT(R)(T) Clinical Research Coordinator

## 2022-07-16 ENCOUNTER — Ambulatory Visit (HOSPITAL_BASED_OUTPATIENT_CLINIC_OR_DEPARTMENT_OTHER): Payer: 59 | Admitting: Certified Registered"

## 2022-07-16 ENCOUNTER — Ambulatory Visit: Payer: 59 | Admitting: Hematology and Oncology

## 2022-07-16 ENCOUNTER — Other Ambulatory Visit: Payer: Self-pay | Admitting: Hematology and Oncology

## 2022-07-16 ENCOUNTER — Other Ambulatory Visit: Payer: Self-pay

## 2022-07-16 ENCOUNTER — Encounter (HOSPITAL_BASED_OUTPATIENT_CLINIC_OR_DEPARTMENT_OTHER)
Admission: RE | Disposition: A | Discharge status: Home / Self Care | Payer: Self-pay | Source: Home / Self Care | Attending: Surgery

## 2022-07-16 ENCOUNTER — Ambulatory Visit (HOSPITAL_COMMUNITY): Payer: 59

## 2022-07-16 ENCOUNTER — Encounter: Payer: Self-pay | Admitting: *Deleted

## 2022-07-16 ENCOUNTER — Ambulatory Visit: Payer: 59

## 2022-07-16 ENCOUNTER — Encounter (HOSPITAL_BASED_OUTPATIENT_CLINIC_OR_DEPARTMENT_OTHER): Payer: Self-pay | Admitting: Surgery

## 2022-07-16 ENCOUNTER — Ambulatory Visit (HOSPITAL_BASED_OUTPATIENT_CLINIC_OR_DEPARTMENT_OTHER)
Admission: RE | Admit: 2022-07-16 | Discharge: 2022-07-16 | Disposition: A | Discharge status: Home / Self Care | Payer: 59 | Attending: Surgery | Admitting: Surgery

## 2022-07-16 DIAGNOSIS — Z170 Estrogen receptor positive status [ER+]: Secondary | ICD-10-CM | POA: Insufficient documentation

## 2022-07-16 DIAGNOSIS — C50411 Malignant neoplasm of upper-outer quadrant of right female breast: Secondary | ICD-10-CM | POA: Insufficient documentation

## 2022-07-16 DIAGNOSIS — Z452 Encounter for adjustment and management of vascular access device: Secondary | ICD-10-CM | POA: Insufficient documentation

## 2022-07-16 DIAGNOSIS — C50911 Malignant neoplasm of unspecified site of right female breast: Secondary | ICD-10-CM | POA: Diagnosis not present

## 2022-07-16 DIAGNOSIS — F17210 Nicotine dependence, cigarettes, uncomplicated: Secondary | ICD-10-CM

## 2022-07-16 DIAGNOSIS — Z8711 Personal history of peptic ulcer disease: Secondary | ICD-10-CM | POA: Insufficient documentation

## 2022-07-16 DIAGNOSIS — C773 Secondary and unspecified malignant neoplasm of axilla and upper limb lymph nodes: Secondary | ICD-10-CM

## 2022-07-16 DIAGNOSIS — Z01818 Encounter for other preprocedural examination: Secondary | ICD-10-CM

## 2022-07-16 HISTORY — PX: PORTACATH PLACEMENT: SHX2246

## 2022-07-16 SURGERY — INSERTION, TUNNELED CENTRAL VENOUS DEVICE, WITH PORT
Anesthesia: General | Site: Neck

## 2022-07-16 MED ORDER — MIDAZOLAM HCL 5 MG/5ML IJ SOLN
INTRAMUSCULAR | Status: DC | PRN
  Administered 2022-07-16: 2 mg via INTRAVENOUS

## 2022-07-16 MED ORDER — MEPERIDINE HCL 25 MG/ML IJ SOLN: 6.2500 mg | INTRAMUSCULAR | Status: DC | PRN

## 2022-07-16 MED ORDER — OXYCODONE HCL 5 MG/5ML PO SOLN: 5.0000 mg | Freq: Once | ORAL | Status: AC | PRN

## 2022-07-16 MED ORDER — PROPOFOL 10 MG/ML IV BOLUS
INTRAVENOUS | Status: DC | PRN
  Administered 2022-07-16: 200 mg via INTRAVENOUS

## 2022-07-16 MED ORDER — MIDAZOLAM HCL 2 MG/2ML IJ SOLN
INTRAMUSCULAR | Status: AC
  Filled 2022-07-16: qty 2

## 2022-07-16 MED ORDER — AMISULPRIDE (ANTIEMETIC) 5 MG/2ML IV SOLN: 10.0000 mg | Freq: Once | INTRAVENOUS | Status: DC | PRN

## 2022-07-16 MED ORDER — CHLORHEXIDINE GLUCONATE CLOTH 2 % EX PADS: 6.0000 | MEDICATED_PAD | Freq: Once | CUTANEOUS | Status: DC

## 2022-07-16 MED ORDER — OXYCODONE HCL 5 MG PO TABS
5.0000 mg | ORAL_TABLET | Freq: Once | ORAL | Status: AC | PRN
  Administered 2022-07-16: 5 mg via ORAL

## 2022-07-16 MED ORDER — CEFAZOLIN SODIUM-DEXTROSE 2-4 GM/100ML-% IV SOLN
2.0000 g | INTRAVENOUS | Status: AC
  Administered 2022-07-16: 2 g via INTRAVENOUS

## 2022-07-16 MED ORDER — CEFAZOLIN SODIUM-DEXTROSE 2-4 GM/100ML-% IV SOLN
INTRAVENOUS | Status: AC
  Filled 2022-07-16: qty 100

## 2022-07-16 MED ORDER — ONDANSETRON HCL 4 MG/2ML IJ SOLN: 4.0000 mg | Freq: Once | INTRAMUSCULAR | Status: DC | PRN

## 2022-07-16 MED ORDER — HYDROCODONE-ACETAMINOPHEN 5-325 MG PO TABS
1.0000 | ORAL_TABLET | Freq: Four times a day (QID) | ORAL | 0 refills | Status: DC | PRN
Start: 2022-07-16 — End: 2023-12-29

## 2022-07-16 MED ORDER — PROPOFOL 10 MG/ML IV BOLUS
INTRAVENOUS | Status: AC
  Filled 2022-07-16: qty 20

## 2022-07-16 MED ORDER — KETOROLAC TROMETHAMINE 30 MG/ML IJ SOLN: 30.0000 mg | Freq: Once | INTRAMUSCULAR | Status: DC | PRN

## 2022-07-16 MED ORDER — 0.9 % SODIUM CHLORIDE (POUR BTL) OPTIME: TOPICAL | Status: DC | PRN

## 2022-07-16 MED ORDER — BUPIVACAINE-EPINEPHRINE 0.25% -1:200000 IJ SOLN
INTRAMUSCULAR | Status: DC | PRN
  Administered 2022-07-16: 10 mL

## 2022-07-16 MED ORDER — LACTATED RINGERS IV SOLN: INTRAVENOUS | Status: DC

## 2022-07-16 MED ORDER — ACETAMINOPHEN 500 MG PO TABS
1000.0000 mg | ORAL_TABLET | ORAL | Status: AC
  Administered 2022-07-16: 1000 mg via ORAL

## 2022-07-16 MED ORDER — HEPARIN (PORCINE) IN NACL 2-0.9 UNITS/ML
INTRAMUSCULAR | Status: AC | PRN
  Administered 2022-07-16: 500 mL

## 2022-07-16 MED ORDER — ONDANSETRON HCL 4 MG/2ML IJ SOLN
INTRAMUSCULAR | Status: DC | PRN
  Administered 2022-07-16: 4 mg via INTRAVENOUS

## 2022-07-16 MED ORDER — ACETAMINOPHEN 500 MG PO TABS: 1000.0000 mg | ORAL_TABLET | Freq: Once | ORAL | Status: DC

## 2022-07-16 MED ORDER — FENTANYL CITRATE (PF) 100 MCG/2ML IJ SOLN
INTRAMUSCULAR | Status: AC
  Filled 2022-07-16: qty 2

## 2022-07-16 MED ORDER — ACETAMINOPHEN 500 MG PO TABS
ORAL_TABLET | ORAL | Status: AC
  Filled 2022-07-16: qty 2

## 2022-07-16 MED ORDER — DEXAMETHASONE SODIUM PHOSPHATE 4 MG/ML IJ SOLN
INTRAMUSCULAR | Status: DC | PRN
  Administered 2022-07-16: 4 mg via INTRAVENOUS

## 2022-07-16 MED ORDER — HEPARIN SOD (PORK) LOCK FLUSH 100 UNIT/ML IV SOLN
INTRAVENOUS | Status: DC | PRN
  Administered 2022-07-16: 500 [IU]

## 2022-07-16 MED ORDER — LIDOCAINE HCL (CARDIAC) PF 100 MG/5ML IV SOSY
PREFILLED_SYRINGE | INTRAVENOUS | Status: DC | PRN
  Administered 2022-07-16: 60 mg via INTRAVENOUS

## 2022-07-16 MED ORDER — OXYCODONE HCL 5 MG PO TABS
ORAL_TABLET | ORAL | Status: AC
  Filled 2022-07-16: qty 1

## 2022-07-16 MED ORDER — FENTANYL CITRATE (PF) 100 MCG/2ML IJ SOLN
INTRAMUSCULAR | Status: DC | PRN
  Administered 2022-07-16 (×2): 25 ug via INTRAVENOUS
  Administered 2022-07-16: 50 ug via INTRAVENOUS

## 2022-07-16 MED ORDER — HYDROMORPHONE HCL 1 MG/ML IJ SOLN
INTRAMUSCULAR | Status: AC
  Filled 2022-07-16: qty 0.5

## 2022-07-16 MED ORDER — HYDROMORPHONE HCL 1 MG/ML IJ SOLN
0.2500 mg | INTRAMUSCULAR | Status: DC | PRN
  Administered 2022-07-16: 0.5 mg via INTRAVENOUS

## 2022-07-16 MED FILL — Fosaprepitant Dimeglumine For IV Infusion 150 MG (Base Eq): INTRAVENOUS | Qty: 5 | Status: AC

## 2022-07-16 MED FILL — Dexamethasone Sodium Phosphate Inj 100 MG/10ML: INTRAMUSCULAR | Qty: 1 | Status: AC

## 2022-07-16 SURGICAL SUPPLY — 48 items
APL PRP STRL LF DISP 70% ISPRP (MISCELLANEOUS) ×1
APL SKNCLS STERI-STRIP NONHPOA (GAUZE/BANDAGES/DRESSINGS) ×1
BAG DECANTER FOR FLEXI CONT (MISCELLANEOUS) ×1 IMPLANT
BENZOIN TINCTURE PRP APPL 2/3 (GAUZE/BANDAGES/DRESSINGS) ×1 IMPLANT
BLADE SURG 11 STRL SS (BLADE) ×1 IMPLANT
BLADE SURG 15 STRL LF DISP TIS (BLADE) ×1 IMPLANT
BLADE SURG 15 STRL SS (BLADE) ×1
CANISTER SUCT 1200ML W/VALVE (MISCELLANEOUS) IMPLANT
CHLORAPREP W/TINT 26 (MISCELLANEOUS) ×1 IMPLANT
CLEANER CAUTERY TIP 5X5 PAD (MISCELLANEOUS) ×1 IMPLANT
COVER BACK TABLE 60X90IN (DRAPES) ×1 IMPLANT
COVER MAYO STAND STRL (DRAPES) ×1 IMPLANT
COVER PROBE 5X48 (MISCELLANEOUS) ×1
DRAPE C-ARM 42X72 X-RAY (DRAPES) ×1 IMPLANT
DRAPE LAPAROTOMY TRNSV 102X78 (DRAPES) ×1 IMPLANT
DRAPE UTILITY XL STRL (DRAPES) ×1 IMPLANT
DRSG TEGADERM 4X4.75 (GAUZE/BANDAGES/DRESSINGS) ×1 IMPLANT
ELECT REM PT RETURN 9FT ADLT (ELECTROSURGICAL) ×1
ELECTRODE REM PT RTRN 9FT ADLT (ELECTROSURGICAL) ×1 IMPLANT
GAUZE 4X4 16PLY ~~LOC~~+RFID DBL (SPONGE) ×1 IMPLANT
GAUZE SPONGE 2X2 STRL 8-PLY (GAUZE/BANDAGES/DRESSINGS) ×1 IMPLANT
GAUZE SPONGE 4X4 12PLY STRL LF (GAUZE/BANDAGES/DRESSINGS) IMPLANT
GLOVE BIO SURGEON STRL SZ7 (GLOVE) ×1 IMPLANT
GLOVE BIOGEL PI IND STRL 7.5 (GLOVE) ×1 IMPLANT
GOWN STRL REUS W/ TWL LRG LVL3 (GOWN DISPOSABLE) ×1 IMPLANT
GOWN STRL REUS W/TWL LRG LVL3 (GOWN DISPOSABLE) ×2
IV KIT MINILOC 20X1 SAFETY (NEEDLE) IMPLANT
KIT CVR 48X5XPRB PLUP LF (MISCELLANEOUS) IMPLANT
KIT PORT POWER 8FR ISP CVUE (Port) IMPLANT
NDL HYPO 25X1 1.5 SAFETY (NEEDLE) ×1 IMPLANT
NDL SAFETY ECLIP 18X1.5 (MISCELLANEOUS) IMPLANT
NDL SPNL 22GX3.5 QUINCKE BK (NEEDLE) IMPLANT
NEEDLE HYPO 25X1 1.5 SAFETY (NEEDLE) ×1 IMPLANT
NEEDLE SPNL 22GX3.5 QUINCKE BK (NEEDLE) IMPLANT
PACK BASIN DAY SURGERY FS (CUSTOM PROCEDURE TRAY) ×1 IMPLANT
PENCIL SMOKE EVACUATOR (MISCELLANEOUS) ×1 IMPLANT
SLEEVE SCD COMPRESS KNEE MED (STOCKING) ×1 IMPLANT
SPIKE FLUID TRANSFER (MISCELLANEOUS) ×1 IMPLANT
STRIP CLOSURE SKIN 1/2X4 (GAUZE/BANDAGES/DRESSINGS) ×1 IMPLANT
SUT MON AB 4-0 PC3 18 (SUTURE) ×1 IMPLANT
SUT PROLENE 2 0 CT2 30 (SUTURE) ×1 IMPLANT
SUT VIC AB 3-0 SH 27 (SUTURE) ×1
SUT VIC AB 3-0 SH 27X BRD (SUTURE) ×1 IMPLANT
SYR 5ML LUER SLIP (SYRINGE) ×1 IMPLANT
SYR CONTROL 10ML LL (SYRINGE) ×1 IMPLANT
TOWEL GREEN STERILE FF (TOWEL DISPOSABLE) ×1 IMPLANT
TUBE CONNECTING 20X1/4 (TUBING) IMPLANT
YANKAUER SUCT BULB TIP NO VENT (SUCTIONS) IMPLANT

## 2022-07-16 NOTE — Transfer of Care (Signed)
Immediate Anesthesia Transfer of Care Note  Patient: MARQUE BANGO  Procedure(s) Performed: INSERTION PORT-A-CATH (Neck)  Patient Location: PACU  Anesthesia Type:General  Level of Consciousness: drowsy and patient cooperative  Airway & Oxygen Therapy: Patient Spontanous Breathing and Patient connected to face mask oxygen  Post-op Assessment: Report given to RN and Post -op Vital signs reviewed and stable  Post vital signs: Reviewed and stable  Last Vitals:  Vitals Value Taken Time  BP    Temp    Pulse 66 07/16/22 0936  Resp    SpO2 95 % 07/16/22 0936  Vitals shown include unvalidated device data.  Last Pain:  Vitals:   07/16/22 0718  TempSrc: Oral  PainSc: 7       Patients Stated Pain Goal: 5 (07/16/22 0718)  Complications: No notable events documented.

## 2022-07-16 NOTE — Anesthesia Procedure Notes (Signed)
Procedure Name: LMA Insertion Date/Time: 07/16/2022 8:40 AM  Performed by: Berdell Hostetler, Jewel Baize, CRNAPre-anesthesia Checklist: Patient identified, Emergency Drugs available, Suction available and Patient being monitored Patient Re-evaluated:Patient Re-evaluated prior to induction Oxygen Delivery Method: Circle system utilized Preoxygenation: Pre-oxygenation with 100% oxygen Induction Type: IV induction Ventilation: Mask ventilation without difficulty LMA: LMA inserted LMA Size: 4.0 Number of attempts: 1 Airway Equipment and Method: Bite block Placement Confirmation: positive ETCO2 Tube secured with: Tape Dental Injury: Teeth and Oropharynx as per pre-operative assessment

## 2022-07-16 NOTE — Interval H&P Note (Signed)
History and Physical Interval Note:  07/16/2022 7:21 AM  Monica Soto  has presented today for surgery, with the diagnosis of RIGHT BREAST CANCER.  The various methods of treatment have been discussed with the patient and family. After consideration of risks, benefits and other options for treatment, the patient has consented to  Procedure(s): INSERTION PORT-A-CATH (N/A) as a surgical intervention.  The patient's history has been reviewed, patient examined, no change in status, stable for surgery.  I have reviewed the patient's chart and labs.  Questions were answered to the patient's satisfaction.     [name]

## 2022-07-16 NOTE — Anesthesia Postprocedure Evaluation (Signed)
Anesthesia Post Note  Patient: Monica Soto  Procedure(s) Performed: INSERTION PORT-A-CATH (Neck)     Patient location during evaluation: PACU Anesthesia Type: General Level of consciousness: awake and alert, oriented and patient cooperative Pain management: pain level controlled Vital Signs Assessment: post-procedure vital signs reviewed and stable Respiratory status: spontaneous breathing, nonlabored ventilation and respiratory function stable Cardiovascular status: blood pressure returned to baseline and stable Postop Assessment: no apparent nausea or vomiting Anesthetic complications: no   No notable events documented.  Last Vitals:  Vitals:   07/16/22 1015 07/16/22 1029  BP: 125/77 124/84  Pulse: 80 72  Resp: 15 10  Temp:    SpO2: 92% 96%    Last Pain:  Vitals:   07/16/22 1029  TempSrc:   PainSc: 2                  [name]

## 2022-07-16 NOTE — Op Note (Signed)
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 Over the last several months the patient felt some odd sensation and itching in her right breast. About 3 months ago she started feeling firmness and thickening of the skin of the right breast. This has become more tender. The breast is entirely firm with discoloration of the skin spreading across the midline. She underwent evaluation with diagnostic mammogram and ultrasound. The right breast is almost entirely replaced by a firm hard mass measuring at least 9 cm. Ultrasound of the axilla showed 2 thickened lymph nodes. She underwent biopsy of the mass as well as the 2 enlarged lymph nodes. 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 Both biopsy lymph nodes were positive for metastatic carcinoma.    Description of procedure: The patient is brought to the operating room placed in the supine position on the operating table.  After an adequate level of general anesthesia was obtained, the patient right arm was tucked at her side.  Her right chest and neck were prepped with ChloraPrep and draped sterile fashion.  A timeout was taken to ensure the proper patient and proper procedure.  She was placed in Trendelenburg position.  We interrogated her neck with the ultrasound.  The jugular vein is easily identified.  Using ultrasound guidance we directly cannulated the internal jugular vein with good blood return.  The wire passed easily.  Fluoroscopy confirmed that the wire headed down the right side of the mediastinum.  The needle was removed.  We created a subcutaneous pocket below the right clavicle.  We first  anesthetized with local anesthetic.  We created a subcutaneous tunnel from the subcutaneous pocket to the insertion site on the neck.  An 8 French [place] port was assembled and was tunneled from the subcutaneous pocket to the insertion site.  The catheter was cut to the appropriate length using fluoroscopic guidance.  Using fluoroscopic guidance, we passed the dilator and breakaway sheath over the wire.  The wire and dilator were removed.  The catheter was then advanced through the sheath which was removed.  Fluoroscopy confirmed that there were no kinks along the length of the catheter.  We are able to aspirate blood easily through the port and were able to flush easily.  The port was secured with two interrupted 2-0 Prolene sutures.  3-0 Vicryl was used to close the subcutaneous tissue and 4-0 Monocryl was used to close the skin at both sites.  Benzoin and Steri-Strips were applied.  The port was accessed and instilled with concentrated heparin solution.  An occlusive dressing was placed.  The patient was then extubated and brought to the recovery room in stable condition.  All sponge, instrument, and needle counts are correct.  Post-op chest x-ray is pending.     Wilmon Arms. Corliss Skains, MD, [facility] Surgery  General Surgery   07/16/2022 9:31 AM

## 2022-07-16 NOTE — Anesthesia Preprocedure Evaluation (Addendum)
Anesthesia Evaluation  Patient identified by MRN, date of birth, ID band Patient awake    Reviewed: Allergy & Precautions, H&P , NPO status , Patient's Chart, lab work & pertinent test results  Airway Mallampati: III  TM Distance: >3 FB Neck ROM: Full    Dental  (+) Dental Advisory Given, Teeth Intact   Pulmonary Current Smoker and Patient abstained from smoking. <1ppd, no inhalers   Pulmonary exam normal breath sounds clear to auscultation       Cardiovascular negative cardio ROS Normal cardiovascular exam Rhythm:Regular Rate:Normal     Neuro/Psych  PSYCHIATRIC DISORDERS  Depression    negative neurological ROS     GI/Hepatic Neg liver ROS, PUD (2012),GERD  Controlled,,  Endo/Other  negative endocrine ROS    Renal/GU negative Renal ROS  negative genitourinary   Musculoskeletal negative musculoskeletal ROS (+)    Abdominal   Peds negative pediatric ROS (+)  Hematology negative hematology ROS (+) Hb 14.6   Anesthesia Other Findings R breast ca   Reproductive/Obstetrics negative OB ROS                             Anesthesia Physical Anesthesia Plan  ASA: 3  Anesthesia Plan: General   Post-op Pain Management: Tylenol PO (pre-op)*   Induction: Intravenous  PONV Risk Score and Plan: 2 and Ondansetron, Dexamethasone, Midazolam and Treatment may vary due to age or medical condition  Airway Management Planned: LMA  Additional Equipment: None  Intra-op Plan:   Post-operative Plan: Extubation in OR  Informed Consent: I have reviewed the patients History and Physical, chart, labs and discussed the procedure including the risks, benefits and alternatives for the proposed anesthesia with the patient or authorized representative who has indicated his/her understanding and acceptance.     Dental advisory given  Plan Discussed with: CRNA  Anesthesia Plan Comments:         Anesthesia Quick Evaluation

## 2022-07-16 NOTE — Discharge Instructions (Addendum)
PORT-A-CATH: POST OP INSTRUCTIONS  Always review your discharge instruction sheet given to you by the facility where your surgery was performed.   A prescription for pain medication may be given to you upon discharge. Take your pain medication as prescribed, if needed. If narcotic pain medicine is not needed, then you make take acetaminophen (Tylenol) or ibuprofen (Advil) as needed.  Take your usually prescribed medications unless otherwise directed. If you need a refill on your pain medication, please contact our office. All narcotic pain medicine now requires a paper prescription.  Phoned in and fax refills are no longer allowed by law.  Prescriptions will not be filled after 5 pm or on weekends.  You should follow a light diet for the remainder of the day after your procedure. Most patients will experience some mild swelling and/or bruising in the area of the incision. It may take several days to resolve. It is common to experience some constipation if taking pain medication after surgery. Increasing fluid intake and taking a stool softener (such as Colace) will usually help or prevent this problem from occurring. A mild laxative (Milk of Magnesia or Miralax) should be taken according to package directions if there are no bowel movements after 48 hours.  Unless discharge instructions indicate otherwise, you may remove your bandages 48 hours after surgery, and you may shower at that time. You may have steri-strips (small white skin tapes) in place directly over the incision.  These strips should be left on the skin for 7-10 days.   If your port is left accessed at the end of surgery (needle left in port), the dressing cannot get wet and should only by changed by a healthcare professional. When the port is no longer accessed (when the needle has been removed), follow step 7.   ACTIVITIES:  Limit activity involving your arms for the next 72 hours. Do no strenuous exercise or activity for 1 week.  You may drive when you are no longer taking prescription pain medication, you can comfortably wear a seatbelt, and you can maneuver your car. 10.You may need to see your doctor in the office for a follow-up appointment.  Please       check with your doctor.  11.When you receive a new Port-a-Cath, you will get a product guide and        ID card.  Please keep them in case you need them.  WHEN TO CALL YOUR DOCTOR [phone]): Fever over 101.0 Chills Continued bleeding from incision Increased redness and tenderness at the site Shortness of breath, difficulty breathing   The clinic staff is available to answer your questions during regular business hours. Please don't hesitate to call and ask to speak to one of the nurses or medical assistants for clinical concerns. If you have a medical emergency, go to the nearest emergency room or call 911.  A surgeon from [facility] is always on call at the hospital.    Post Anesthesia Home Care Instructions  Activity: Get plenty of rest for the remainder of the day. A responsible individual must stay with you for 24 hours following the procedure.  For the next 24 hours, DO NOT: -Drive a car -Paediatric nurse -Drink alcoholic beverages -Take any medication unless instructed by your physician -Make any legal decisions or sign important papers.  Meals: Start with liquid foods such as gelatin or soup. Progress to regular foods as tolerated. Avoid greasy, spicy, heavy foods. If nausea and/or vomiting occur, drink only  clear liquids until the nausea and/or vomiting subsides. Call your physician if vomiting continues.  Special Instructions/Symptoms: Your throat may feel dry or sore from the anesthesia or the breathing tube placed in your throat during surgery. If this causes discomfort, gargle with warm salt water. The discomfort should disappear within 24 hours.  If you had a scopolamine patch placed behind your ear for the management of  post- operative nausea and/or vomiting:  1. The medication in the patch is effective for 72 hours, after which it should be removed.  Wrap patch in a tissue and discard in the trash. Wash hands thoroughly with soap and water. 2. You may remove the patch earlier than 72 hours if you experience unpleasant side effects which may include dry mouth, dizziness or visual disturbances. 3. Avoid touching the patch. Wash your hands with soap and water after contact with the patch.    Next dose of tylenol if needed for pain is due at 1:30pm

## 2022-07-17 ENCOUNTER — Other Ambulatory Visit (HOSPITAL_COMMUNITY): Payer: Self-pay

## 2022-07-17 ENCOUNTER — Inpatient Hospital Stay: Payer: 59

## 2022-07-17 ENCOUNTER — Telehealth: Payer: Self-pay

## 2022-07-17 ENCOUNTER — Telehealth: Payer: Self-pay | Admitting: Pharmacy Technician

## 2022-07-17 ENCOUNTER — Other Ambulatory Visit: Payer: Self-pay | Admitting: *Deleted

## 2022-07-17 ENCOUNTER — Other Ambulatory Visit: Payer: Self-pay

## 2022-07-17 ENCOUNTER — Encounter: Payer: Self-pay | Admitting: Genetic Counselor

## 2022-07-17 ENCOUNTER — Inpatient Hospital Stay (HOSPITAL_BASED_OUTPATIENT_CLINIC_OR_DEPARTMENT_OTHER): Payer: 59 | Admitting: Hematology and Oncology

## 2022-07-17 ENCOUNTER — Encounter: Payer: Self-pay | Admitting: Hematology and Oncology

## 2022-07-17 DIAGNOSIS — Z8249 Family history of ischemic heart disease and other diseases of the circulatory system: Secondary | ICD-10-CM | POA: Diagnosis not present

## 2022-07-17 DIAGNOSIS — Z833 Family history of diabetes mellitus: Secondary | ICD-10-CM | POA: Diagnosis not present

## 2022-07-17 DIAGNOSIS — Z79811 Long term (current) use of aromatase inhibitors: Secondary | ICD-10-CM | POA: Diagnosis not present

## 2022-07-17 DIAGNOSIS — G479 Sleep disorder, unspecified: Secondary | ICD-10-CM | POA: Diagnosis not present

## 2022-07-17 DIAGNOSIS — Z79899 Other long term (current) drug therapy: Secondary | ICD-10-CM | POA: Diagnosis not present

## 2022-07-17 DIAGNOSIS — C50411 Malignant neoplasm of upper-outer quadrant of right female breast: Secondary | ICD-10-CM

## 2022-07-17 DIAGNOSIS — Z170 Estrogen receptor positive status [ER+]: Secondary | ICD-10-CM | POA: Diagnosis not present

## 2022-07-17 DIAGNOSIS — C7951 Secondary malignant neoplasm of bone: Secondary | ICD-10-CM | POA: Diagnosis not present

## 2022-07-17 DIAGNOSIS — R519 Headache, unspecified: Secondary | ICD-10-CM | POA: Diagnosis not present

## 2022-07-17 DIAGNOSIS — R234 Changes in skin texture: Secondary | ICD-10-CM | POA: Diagnosis not present

## 2022-07-17 DIAGNOSIS — M25552 Pain in left hip: Secondary | ICD-10-CM | POA: Diagnosis not present

## 2022-07-17 DIAGNOSIS — F17210 Nicotine dependence, cigarettes, uncomplicated: Secondary | ICD-10-CM | POA: Diagnosis not present

## 2022-07-17 DIAGNOSIS — Z90710 Acquired absence of both cervix and uterus: Secondary | ICD-10-CM | POA: Diagnosis not present

## 2022-07-17 DIAGNOSIS — M533 Sacrococcygeal disorders, not elsewhere classified: Secondary | ICD-10-CM | POA: Diagnosis not present

## 2022-07-17 DIAGNOSIS — Z95828 Presence of other vascular implants and grafts: Secondary | ICD-10-CM

## 2022-07-17 DIAGNOSIS — Z9049 Acquired absence of other specified parts of digestive tract: Secondary | ICD-10-CM | POA: Diagnosis not present

## 2022-07-17 DIAGNOSIS — Z86010 Personal history of colonic polyps: Secondary | ICD-10-CM | POA: Diagnosis not present

## 2022-07-17 LAB — CMP (CANCER CENTER ONLY)
ALT: 14 U/L (ref 0–44)
AST: 11 U/L — ABNORMAL LOW (ref 15–41)
Albumin: 4.3 g/dL (ref 3.5–5.0)
Alkaline Phosphatase: 73 U/L (ref 38–126)
Anion gap: 7 (ref 5–15)
BUN: 15 mg/dL (ref 6–20)
CO2: 27 mmol/L (ref 22–32)
Calcium: 10 mg/dL (ref 8.9–10.3)
Chloride: 106 mmol/L (ref 98–111)
Creatinine: 0.7 mg/dL (ref 0.44–1.00)
GFR, Estimated: >60 mL/min (ref >60)
Glucose, Bld: 137 mg/dL — ABNORMAL HIGH (ref 70–99)
Potassium: 3.8 mmol/L (ref 3.5–5.1)
Sodium: 140 mmol/L (ref 135–145)
Total Bilirubin: 0.3 mg/dL (ref 0.3–1.2)
Total Protein: 7.1 g/dL (ref 6.5–8.1)

## 2022-07-17 LAB — CBC WITH DIFFERENTIAL (CANCER CENTER ONLY)
Abs Immature Granulocytes: 0.06 10*3/uL (ref 0.00–0.07)
Basophils Absolute: 0 10*3/uL (ref 0.0–0.1)
Basophils Relative: 0 %
Eosinophils Absolute: 0.1 10*3/uL (ref 0.0–0.5)
Eosinophils Relative: 1 %
HCT: 39.3 % (ref 36.0–46.0)
Hemoglobin: 13.4 g/dL (ref 12.0–15.0)
Immature Granulocytes: 1 %
Lymphocytes Relative: 21 %
Lymphs Abs: 2.2 10*3/uL (ref 0.7–4.0)
MCH: 30.5 pg (ref 26.0–34.0)
MCHC: 34.1 g/dL (ref 30.0–36.0)
MCV: 89.5 fL (ref 80.0–100.0)
Monocytes Absolute: 0.6 10*3/uL (ref 0.1–1.0)
Monocytes Relative: 6 %
Neutro Abs: 7.6 10*3/uL (ref 1.7–7.7)
Neutrophils Relative %: 71 %
Platelet Count: 251 10*3/uL (ref 150–400)
RBC: 4.39 MIL/uL (ref 3.87–5.11)
RDW: 13.1 % (ref 11.5–15.5)
WBC Count: 10.5 10*3/uL (ref 4.0–10.5)
nRBC: 0 % (ref 0.0–0.2)

## 2022-07-17 MED ORDER — RIBOCICLIB SUCC (600 MG DOSE) 200 MG PO TBPK
600.0000 mg | ORAL_TABLET | Freq: Every day | ORAL | 3 refills | Status: DC
  Filled 2022-07-17: qty 63, 21d supply, fill #0

## 2022-07-17 MED ORDER — SODIUM CHLORIDE 0.9% FLUSH
10.0000 mL | INTRAVENOUS | Status: AC | PRN
  Administered 2022-07-17: 10 mL

## 2022-07-17 MED ORDER — RIBOCICLIB SUCC (600 MG DOSE) 200 MG PO TBPK: 600.0000 mg | ORAL_TABLET | Freq: Every day | ORAL | 3 refills | Status: DC

## 2022-07-17 MED ORDER — ANASTROZOLE 1 MG PO TABS: 1.0000 mg | ORAL_TABLET | Freq: Every day | ORAL | 3 refills | Status: DC

## 2022-07-17 NOTE — Progress Notes (Signed)
Request for dental clearance faxed to [facility] at [phone]. Fax confirmation received. Awaiting clearance.

## 2022-07-17 NOTE — Research (Signed)
[employer] 2021-05 - Specimen Collection Study to Evaluate Biomarkers in Subjects with Cancer    This Nurse has reviewed this patient's inclusion and exclusion criteria as a second review and confirms Monica Soto is eligible for study participation.  Patient may continue with enrollment.   Chriss Driver, RN 07/17/22 10:32 AM

## 2022-07-17 NOTE — Telephone Encounter (Signed)
Oral Oncology Patient Advocate Encounter  [facility] plan prefers [place] or [employer]. Will utilize patient's alternate insurance for coverage.  Jinger Neighbors, CPhT-Adv Oncology Pharmacy Patient Advocate [facility] Direct Number: [phone]  Fax: [phone]

## 2022-07-17 NOTE — Telephone Encounter (Signed)
Oral Oncology Patient Advocate Encounter   Received notification that prior authorization for Monica Soto is required.   PA submitted on 07/17/22 Key [id number] Status is pending     Jinger Neighbors, CPhT-Adv Oncology Pharmacy Patient Advocate [facility] Direct Number: [phone]  Fax: [phone]

## 2022-07-17 NOTE — Research (Signed)
[employer] 2021-05 - Specimen Collection Study to Evaluate Biomarkers in Subjects with Cancer    This Nurse has reviewed this patient's inclusion and exclusion criteria and confirmed Monica Soto is eligible for study participation.  Patient will continue with enrollment.   Eligibility confirmed by treating investigator, who also agrees that patient should proceed with enrollment.  Monica Chance Islah Eve, RN, BSN, [facility] She  Her  [facility] Clinical Research Nurse [facility] Direct Dial [phone]  Pager [phone] 07/17/2022 8:38 AM

## 2022-07-17 NOTE — Telephone Encounter (Signed)
Oral Oncology Patient Advocate Encounter   Received notification that prior authorization for Monica Soto is required.   PA submitted on 07/17/22 Key [id number] Status is pending     Jinger Neighbors, CPhT-Adv Oncology Pharmacy Patient Advocate [facility] Direct Number: [phone]  Fax: [phone]

## 2022-07-17 NOTE — Assessment & Plan Note (Signed)
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  Given large tumor size, extensive skin involvement and fixed lymphadenopathy, we discussed neoadj chemo. We also recommended PET staging and she is here to discuss PET reports.  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  We have discussed the known curative intent of treatment.  Given advanced metastatic disease and no evidence of impending visceral crisis, we have discussed about frontline CDK 4 6 inhibition with aromatase inhibitors plus or minus ovarian suppression based on her labs today.  Discussed the mechanism of action, adverse effects with each class including but not limited to fatigue, postmenopausal symptoms, bone density loss, QT prolongation with Ribociclib, cytopenias etc.  She will also need dental clearance to proceed with bisphosphonates given bone metastasis.  I have given her the paperwork today.  She does not complain of much pain except for intermittent left hip pain especially when she has been walking for longer time.  She takes an occasional ibuprofen.  I encouraged her to let me know if it gets worse so we can consider palliative radiation to this area.  She expressed understanding of all the recommendations. Baseline EKG with a corrected QTc interval of 433 ms prior to Ribociclib.

## 2022-07-17 NOTE — Telephone Encounter (Signed)
Oral Oncology Pharmacist Encounter  Received new prescription for Kisqali (ribociclib) for the treatment of ER positive, HER2 negative metastatic breast cancer in conjunction with anastrozole, planned duration until disease progression or unacceptable toxicity.  Labs from 07/17/22 assessed, no interventions needed. Prescription dose and frequency assessed for appropriateness.   Current medication list in Epic reviewed, DDIs with Kisqali identified: - Norco: kisqali may increase the concentration of hydrocodone. Will have patient monitor for increased side effects.   Evaluated chart and no patient barriers to medication adherence noted.   Patient agreement for treatment documented in MD note on 07/17/2022.  Prescription has been e-scribed to the [facility] for benefits analysis and approval.  Oral Oncology Clinic will continue to follow for insurance authorization, copayment issues, initial counseling and start date.  Monica Soto, PharmD Hematology/Oncology Clinical Pharmacist [facility] Oral Chemotherapy Navigation Clinic [phone] 07/17/2022 1:09 PM

## 2022-07-17 NOTE — Telephone Encounter (Signed)
Oral Oncology Patient Advocate Encounter  Prior Authorization for Monica Soto has been approved.    PA# [id number] Effective dates: 07/17/22 through 07/17/23  Patient must fill with [facility] under this plan.    Jinger Neighbors, CPhT-Adv Oncology Pharmacy Patient Advocate [facility] Direct Number: [phone]  Fax: [phone]

## 2022-07-17 NOTE — Telephone Encounter (Signed)
Oral Oncology Patient Advocate Encounter   Was successful in obtaining a copay card for Kisqali.  This copay card will make the patients copay $0.  The billing information is as follows and has been shared with [facility].   RxBin: [id number] PCN: [id number] Member ID: [id number] Group ID: [id number]   Jinger Neighbors, CPhT-Adv Oncology Pharmacy Patient Advocate [facility] Direct Number: [phone]  Fax: [phone]

## 2022-07-17 NOTE — Progress Notes (Signed)
Dental clearance received from Dr Beaulah Corin. Sent to HIM for scanning.

## 2022-07-17 NOTE — Progress Notes (Signed)
[facility] CONSULT NOTE  Patient Care Team: Assunta Found, MD as PCP - General (Family Medicine) Jena Gauss, Gerrit Friends, MD (Gastroenterology) Rachel Moulds, MD as Consulting Physician (Hematology and Oncology) Antony Blackbird, MD as Consulting Physician (Radiation Oncology) Manus Rudd, MD as Consulting Physician (General Surgery) Pershing Proud, RN as Oncology Nurse Navigator Donnelly Angelica, RN as Oncology Nurse Navigator  CHIEF COMPLAINTS/PURPOSE OF CONSULTATION:  Newly diagnosed breast cancer  HISTORY OF PRESENTING ILLNESS:  Monica Soto 57 y.o. female is here because of recent diagnosis of right sided breast cancer  I reviewed her records extensively and collaborated the history with the patient.  SUMMARY OF ONCOLOGIC HISTORY: Oncology History  Malignant neoplasm of upper-outer quadrant of right breast in female, estrogen receptor positive (HCC)  06/19/2022 Mammogram   Mammogram showed large mass in the right breast which appears to measure 8 to 9 cm on ultrasound, 2 abnormal right axillary lymph node and a third borderline lymph node.  Skin thickening noted along the medial aspect of left breast which may be an extension of the marked skin thickening diffusely on the right.  Otherwise no left breast abnormalities are identified.   06/22/2022 Pathology Results   Needle core biopsy from the right breast upper outer quadrant showed grade 2 IDC, both lymph nodes with metastatic adenocarcinoma.  Prognostic showed ER 90% positive strong staining PR 70% positive strong staining Ki-67 of 20% HER2 negative by IHC 1+   07/03/2022 Initial Diagnosis   Malignant neoplasm of upper-outer quadrant of right breast in female, estrogen receptor positive (HCC)   07/04/2022 Cancer Staging   Staging form: Breast, AJCC 8th Edition - Clinical: Stage IIIB (cT4, cN2, cM0, G2, ER+, PR+, HER2-) - Signed by Rachel Moulds, MD on 07/04/2022 Stage prefix: Initial diagnosis Histologic grading  system: 3 grade system   07/16/2022 -  Chemotherapy   Patient is on Treatment Plan : BREAST ADJUVANT DOSE DENSE AC q14d / PACLitaxel q7d      Genetic Testing   [facility] Custom Panel+RNA was Negative. Report date is 07/10/2022.  The Custom Hereditary Cancers Panel offered by [facility] includes sequencing and/or deletion duplication testing of the following 43 genes: APC, ATM, AXIN2, BAP1, BARD1, BMPR1A, BRCA1, BRCA2, BRIP1, CDH1, CDK4, CDKN2A (p14ARF and p16INK4a only), CHEK2, CTNNA1, EPCAM (Deletion/duplication testing only), FH, GREM1 (promoter region duplication testing only), HOXB13, KIT, MBD4, MEN1, MLH1, MSH2, MSH3, MSH6, MUTYH, NF1, NHTL1, PALB2, PDGFRA, PMS2, POLD1, POLE, PTEN, RAD51C, RAD51D, SMAD4, SMARCA4. STK11, TP53, TSC1, TSC2, and VHL.    Since her last visit here,  she had port placed, PET imaging and is here before planned first cycle of chemo. However unfortunately the PET/CT has showed metastatic disease in the bones.  She today tells me that she has been having some left hip pain for many months but did not think much of it.  Other than the left hip pain she denies any other new bone pains.  She and her husband are understandably shocked with the PET/CT results. No other concerning complaints today.  MEDICAL HISTORY:  Past Medical History:  Diagnosis Date   Depression    Erosive esophagitis 04/26/2010   EGD by Dr. Jena Gauss, small hiatal hernia   GERD (gastroesophageal reflux disease)    History of stomach ulcers    IBS (irritable bowel syndrome)    Diarrhea predominant   Microscopic colitis 04/26/2010   Colonoscopy by Dr. Jena Gauss, good response with Entocort   Tubular adenoma of colon 04/26/2010   Junction of descending  and sigmoid 40 CM from anus    SURGICAL HISTORY: Past Surgical History:  Procedure Laterality Date   ABDOMINAL HYSTERECTOMY     APPENDECTOMY  2/11   Dr. Malvin Johns with a delayed closure   BREAST BIOPSY Right 06/22/2022   [place] RT BREAST BX W LOC DEV 1ST LESION  IMG BX SPEC US GUIDE 06/22/2022 [facility] MAMMOGRAPHY   PORTACATH PLACEMENT N/A 07/16/2022   Procedure: INSERTION PORT-A-CATH;  Surgeon: Manus Rudd, MD;  Location: [facility];  Service: General;  Laterality: N/A;   TONSILLECTOMY     TUBAL LIGATION      SOCIAL HISTORY: Social History   Socioeconomic History   Marital status: Married    Spouse name: Not on file   Number of children: Not on file   Years of education: Not on file   Highest education level: Not on file  Occupational History    Employer: [employer]    Comment: Third shift  Tobacco Use   Smoking status: Every Day    Packs/day: .5    Types: Cigarettes   Smokeless tobacco: Never  Vaping Use   Vaping Use: Never used  Substance and Sexual Activity   Alcohol use: Yes    Alcohol/week: 1.0 standard drink of alcohol    Types: 1 Glasses of wine per week    Comment: seldom   Drug use: No   Sexual activity: Yes    Birth control/protection: Surgical  Other Topics Concern   Not on file  Social History Narrative   Not on file   Social Determinants of Health   Financial Resource Strain: Not on file  Food Insecurity: No Food Insecurity (07/04/2022)   Hunger Vital Sign    Worried About Running Out of Food in the Last Year: Never true    Ran Out of Food in the Last Year: Never true  Transportation Needs: No Transportation Needs (07/04/2022)   PRAPARE - Administrator, Civil Service (Medical): No    Lack of Transportation (Non-Medical): No  Physical Activity: Not on file  Stress: Not on file  Social Connections: Not on file  Intimate Partner Violence: Not on file    FAMILY HISTORY: Family History  Problem Relation Age of Onset   Diabetes Mother    Coronary artery disease Mother    Healthy Father     ALLERGIES:  has No Known Allergies.  MEDICATIONS:  Current Outpatient Medications  Medication Sig Dispense Refill   anastrozole (ARIMIDEX) 1 MG tablet Take 1 tablet (1 mg total) by  mouth daily. 90 tablet 3   ribociclib succ (KISQALI 600MG  DAILY DOSE) 200 MG Therapy Pack Take 3 tablets (600 mg total) by mouth daily. Take for 21 days on, 7 days off, repeat every 28 days. 63 tablet 3   Aspirin-Acetaminophen-Caffeine (GOODY HEADACHE PO) Take 1 packet by mouth 2 (two) times daily as needed (for pain).     cetirizine (ZYRTEC ALLERGY) 10 MG tablet Take 1 tablet (10 mg total) by mouth daily. 30 tablet 2   cyclobenzaprine (FLEXERIL) 10 MG tablet Take 1 tablet (10 mg total) by mouth 3 (three) times daily as needed for muscle spasms. 30 tablet 0   dexamethasone (DECADRON) 4 MG tablet Take 2 tablets (8 mg total) by mouth daily for 3 days. Start the day after doxorubicin/cyclophosphamide chemotherapy. Take with food. 30 tablet 1   diphenhydrAMINE (BENADRYL) 25 mg capsule Take 25 mg by mouth every 6 (six) hours as needed for itching.  fluticasone (FLONASE) 50 MCG/ACT nasal spray Place 2 sprays into both nostrils daily. 16 g 6   HYDROcodone-acetaminophen (NORCO/VICODIN) 5-325 MG tablet Take 1 tablet by mouth every 6 (six) hours as needed for moderate pain. 15 tablet 0   lidocaine-prilocaine (EMLA) cream Apply to affected area once 30 g 3   naproxen (NAPROSYN) 500 MG tablet Take 1 tablet (500 mg total) by mouth 2 (two) times daily with a meal. 30 tablet 0   ondansetron (ZOFRAN) 8 MG tablet Take 1 tab (8 mg) by mouth every 8 hrs as needed for nausea/vomiting. Start third day after doxorubicin/cyclophosphamide chemotherapy. 30 tablet 1   prochlorperazine (COMPAZINE) 10 MG tablet Take 1 tablet (10 mg total) by mouth every 6 (six) hours as needed for nausea or vomiting. 30 tablet 1   promethazine-dextromethorphan (PROMETHAZINE-DM) 6.25-15 MG/5ML syrup Take 5 mLs by mouth at bedtime as needed for cough. Do not take with alcohol or while driving or operating heavy machinery.  May cause drowsiness. 100 mL 0   No current facility-administered medications for this visit.   PHYSICAL  EXAMINATION: ECOG PERFORMANCE STATUS: 1 - Symptomatic but completely ambulatory  Vitals:   07/17/22 1025  BP: (!) 162/90  Pulse: 85  Resp: 18  Temp: (!) 97.5 F (36.4 C)  SpO2: 100%   Filed Weights   07/17/22 1025  Weight: 169 lb 12.8 oz (77 kg)    GENERAL:alert, no distress and comfortable Physical exam deferred in lieu of counseling  LABORATORY DATA:  I have reviewed the data as listed Lab Results  Component Value Date   WBC 10.5 07/17/2022   HGB 13.4 07/17/2022   HCT 39.3 07/17/2022   MCV 89.5 07/17/2022   PLT 251 07/17/2022   Lab Results  Component Value Date   NA 140 07/17/2022   K 3.8 07/17/2022   CL 106 07/17/2022   CO2 27 07/17/2022    RADIOGRAPHIC STUDIES: I have personally reviewed the radiological reports and agreed with the findings in the report.  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  Given large tumor size, extensive skin involvement and fixed lymphadenopathy, we discussed neoadj chemo. We also recommended PET staging and she is here to discuss PET reports.  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  We have discussed the known curative intent of treatment.  Given advanced metastatic disease and no evidence of impending visceral crisis, we have discussed about frontline CDK 4 6 inhibition with aromatase inhibitors plus or minus ovarian suppression based on her labs today.  Discussed the mechanism of action, adverse effects with each class including but not limited to fatigue, postmenopausal symptoms, bone density loss, QT prolongation  with Ribociclib, cytopenias etc.  She will also need dental clearance to proceed with bisphosphonates given bone metastasis.  I have given her the paperwork today.  She does not complain of much pain except for intermittent left hip pain especially when she has been walking for longer time.  She takes an occasional ibuprofen.  I encouraged her to let me know if it gets worse so we can consider palliative radiation to this area.  She expressed understanding  of all the recommendations. Baseline EKG with a corrected QTc interval of 433 ms prior to Ribociclib.   I have also discussed about the research studies we have available and she is interested in discussing with the research coordinator. Total time spent:40 minutes including history, physical exam, review of records, counseling and coordination of care All questions were answered. The patient knows to call the clinic with any problems, questions or concerns.    Rachel Moulds, MD 07/17/22

## 2022-07-18 ENCOUNTER — Telehealth: Payer: Self-pay | Admitting: Hematology and Oncology

## 2022-07-18 ENCOUNTER — Encounter: Payer: Self-pay | Admitting: *Deleted

## 2022-07-18 NOTE — Telephone Encounter (Signed)
Spoke with patient confirming upcoming appointment  

## 2022-07-18 NOTE — Progress Notes (Signed)
Previous dental clearance listed incorrect drug under treatment. New dental clearance letter with Zometa as treatment faxed to [facility] at [phone]. Fax confirmation received. Awaiting clearance.

## 2022-07-19 ENCOUNTER — Ambulatory Visit: Payer: 59

## 2022-07-19 ENCOUNTER — Telehealth: Payer: Self-pay

## 2022-07-19 ENCOUNTER — Other Ambulatory Visit: Payer: Self-pay

## 2022-07-19 ENCOUNTER — Other Ambulatory Visit (HOSPITAL_COMMUNITY): Payer: Self-pay

## 2022-07-19 ENCOUNTER — Other Ambulatory Visit: Payer: Self-pay | Admitting: Hematology and Oncology

## 2022-07-19 LAB — LUTEINIZING HORMONE: LH: 23.8 m[IU]/mL

## 2022-07-19 LAB — ESTRADIOL: Estradiol: <5 pg/mL

## 2022-07-19 LAB — FOLLICLE STIMULATING HORMONE: FSH: 39.5 m[IU]/mL

## 2022-07-19 NOTE — Progress Notes (Signed)
Zometa ordered, dental clearance received.  Monica Soto

## 2022-07-19 NOTE — Telephone Encounter (Signed)
-----   Message from Rachel Moulds, MD sent at 07/19/2022 11:41 AM EDT ----- Labs from today consistent with menopause.

## 2022-07-19 NOTE — Telephone Encounter (Signed)
Called with below message. Pt asked if she can go ahead and start anastrozole. Advised Pt to go ahead and start rx. Pt inquiring why Idelle Jo is coming from [facility] and when it will be delivered. Per [facility], [facility] aligns with insurance coverage. After speaking with [facility] pharmacy, they stated that they will call the Pt in the next 2 daysto set up delivery. Instructed Pt that if she has not heard from them by Monday, to give [place] a call. Pt verbalized understanding.

## 2022-07-20 ENCOUNTER — Other Ambulatory Visit (HOSPITAL_COMMUNITY): Payer: 59

## 2022-07-20 NOTE — Progress Notes (Signed)
Received dental clearance signed by Dr. Beaulah Corin from [facility]. Print was small and difficult to read, called to confirm OK to proceed with treatment and any recommendations. Per Dr. Beaulah Corin, Pt is OK to proceed with treatment and no recommendations, stating "Patient has treatment needs but as of last visit in January 2024 they did not include any extractions". Dental Clearance letter at RN desk to be reviewed by MD on Monday 07/23/22.

## 2022-07-22 LAB — ANTI MULLERIAN HORMONE: ANTI-MULLERIAN HORMONE (AMH): <0.015 ng/mL

## 2022-07-23 ENCOUNTER — Telehealth: Payer: Self-pay | Admitting: Genetic Counselor

## 2022-07-23 NOTE — Telephone Encounter (Signed)
I contacted Monica Soto to discuss her genetic testing results. No pathogenic variants were identified in the 43 genes analyzed. Detailed clinic note to follow.  The test report has been scanned into EPIC and is located under the Molecular Pathology section of the Results Review tab.  A portion of the result report is included below for reference.   Lalla Brothers, MS, [facility] Genetic Counselor [email or web address] (P) [phone]

## 2022-07-24 ENCOUNTER — Telehealth: Payer: Self-pay | Admitting: Hematology and Oncology

## 2022-07-24 NOTE — Telephone Encounter (Signed)
Spoke with patient confirming upcoming appointment  

## 2022-07-24 NOTE — Telephone Encounter (Signed)
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   Counseled patient on administration, dosing, side effects, monitoring, drug-food interactions, safe handling, storage, and disposal.  Patient will take Kisqali 200mg  tablets, 3 tablets (600mg ) by mouth once daily without regard to food.  Patient will take Kisqali for 3 weeks on, 1 week off, repeat every 4 weeks.  Patient knows to avoid grapefruit and grapefruit juice.  Kisqali start date: 07/25/2022  Patient will receive EKG monitoring at visits with MD.  Electrolytes will also be closely monitored at treatment initiation.  Patient is no longer taking the Norco as she has not needed it. Notified patient that there were no other drug interactions with her medications.   Adverse effects include but are not limited to: nausea, vomiting, diarrhea, constipation, fatigue, rash, decreased blood counts, and altered cardiac conduction. Severe, life-threatening, and/or fatal interstitial lung disease (ILD) and/or pneumonitis may occur with CDK 4/6 inhibitors.  Patient has anti-emetic on hand and knows to take it if nausea develops.   Patient will obtain anti diarrheal and alert the office of 4 or more loose stools above baseline.  Reviewed with patient importance of keeping a medication schedule and plan for any missed doses. No barriers to medication adherence identified.  Medication reconciliation performed and medication/allergy list updated.  Insurance authorization for Idelle Jo has been obtained. Test claim at the pharmacy revealed copayment $0 for 1st fill of 28 days. Patient receives medication through [facility].   Patient informed the pharmacy will reach out 5-7 days prior to needing next fill of Kisqali  to coordinate continued medication acquisition to prevent break in therapy.  All questions answered.  Patient voiced understanding and appreciation.   Medication education handout placed in mail for patient. Patient knows to call the office with questions or concerns. [facility]otherapy [facility] phone number provided to patient.   Bethel Born, PharmD Hematology/Oncology Clinical Pharmacist [facility] [phone] 07/24/2022   11:35 AM

## 2022-07-24 NOTE — Progress Notes (Signed)
[facility] [address] [place] [address]   Clinic Day:  07/25/2022  Referring physician: Rachel Moulds, MD  Patient Care Team: Assunta Found, MD as PCP - General (Family Medicine) Jena Gauss, Gerrit Friends, MD (Gastroenterology) Rachel Moulds, MD as Consulting Physician (Hematology and Oncology) Antony Blackbird, MD as Consulting Physician (Radiation Oncology) Manus Rudd, MD as Consulting Physician (General Surgery) Pershing Proud, RN as Oncology Nurse Navigator Donnelly Angelica, RN as Oncology Nurse Navigator Doreatha Massed, MD as Medical Oncologist (Medical Oncology) Therese Sarah, RN as Oncology Nurse Navigator (Medical Oncology)   ASSESSMENT & PLAN:   Assessment:  1.  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  Noticed skin changes in the right breast 3 months ago. - Mammogram (06/19/2022): Large 8-9 cm mass in the right breast with 2 abnormal axillary lymph nodes and skin thickening. 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  1.8 cm ill-defined enhancing mass in the lateral left breast suspicious for additional malignancy.  Right breast mass involves right chest wall including pectoralis and intercostal muscles.  Prominent subcentimeter left axillary lymph nodes. - PET scan (07/12/2022): Intensely hypermetabolic right breast mass with right axillary lymph nodes.  Hypermetabolic metastatic lymph node in the posterior triangle of the right neck.  Hypermetabolic left axillary lymph nodes.  No evidence of metastatic adenopathy in the  mediastinum/abdomen/pelvis.  No evidence of visceral metastasis.  Multiple skeletal metastasis: Left sacral ala 2.5 cm, left iliac bone, L4 and T12 vertebral bodies, multiple rib lesions, left scapular lesion. - She was evaluated by Dr. Al Pimple and was recommended treatment with ribociclib and anastrozole. - Germline mutation testing: Negative. - Anastrozole and ribociclib started on 07/25/2022  2.  Social/family history: - She lives at home with her husband.  Used OCP for approximately 16-18 years.  Underwent hysterectomy after endometrial ablation when she was found to have fibroids causing pain.  No family history of breast or other malignancies.  Smokes 1 pack of cigarettes per day.   Plan:  1.  Metastatic ER/PR+, HER2-right breast IDC to the bones: - She would like to transfer her care to our center as she lives close by. - I agree with first-line treatment with combination of ribociclib and anastrozole. - She already started taking anastrozole.  She will start ribociclib today. - We discussed side effects of ribociclib in detail.  We also discussed prognosis of metastatic breast cancer with treatment intent in the palliative setting.  She would like to have right mastectomy done if it is feasible after few months. - RTC 2 weeks for follow-up.  We will check CBC, LFTs every 2 weeks for the first 2 cycles.  We will also repeat EKG on day 14 of cycle 1 and beginning of cycle 2 and then as clinically indicated.  2.  Bone metastasis: - We discussed role of bisphosphonates to decrease skeletal related events. - She will receive first dose of zoledronic acid on Monday.  She already had dental clearance.     No orders of the defined types were placed in this encounter.     I,Katie Daubenspeck,acting as a Neurosurgeon  for Doreatha Massed, MD.,have documented all relevant documentation on the behalf of Doreatha Massed, MD,as directed by  Doreatha Massed, MD while in the presence of  Doreatha Massed, MD.   I, Doreatha Massed MD, have reviewed the above documentation for accuracy and completeness, and I agree with the above.   Doreatha Massed, MD   6/19/20246:07 PM  CHIEF COMPLAINT/PURPOSE OF CONSULT:   Diagnosis: metastatic inflammatory right breast cancer   Cancer Staging  Malignant neoplasm of upper-outer quadrant of right breast in female, estrogen receptor positive (HCC) Staging form: Breast, AJCC 8th Edition - Clinical: Stage IV (cT4, cN2, cM1, G2, ER+, PR+, HER2-) - Signed by Rachel Moulds, MD on 07/17/2022    Prior Therapy: none  Current Therapy: Anastrozole and ribociclib   HISTORY OF PRESENT ILLNESS:   Oncology History  Malignant neoplasm of upper-outer quadrant of right breast in female, estrogen receptor positive (HCC)  06/19/2022 Mammogram   Mammogram showed large mass in the right breast which appears to measure 8 to 9 cm on ultrasound, 2 abnormal right axillary lymph node and a third borderline lymph node.  Skin thickening noted along the medial aspect of left breast which may be an extension of the marked skin thickening diffusely on the right.  Otherwise no left breast abnormalities are identified.   06/22/2022 Pathology Results   Needle core biopsy from the right breast upper outer quadrant showed grade 2 IDC, both lymph nodes with metastatic adenocarcinoma.  Prognostic showed ER 90% positive strong staining PR 70% positive strong staining Ki-67 of 20% HER2 negative by IHC 1+   07/03/2022 Initial Diagnosis   Malignant neoplasm of upper-outer quadrant of right breast in female, estrogen receptor positive (HCC)   07/04/2022 Cancer Staging   Staging form: Breast, AJCC 8th Edition - Clinical: Stage IV (cT4, cN2, cM1, G2, ER+, PR+, HER2-) - Signed by Rachel Moulds, MD on 07/17/2022 Stage prefix: Initial diagnosis Histologic grading system: 3 grade system   07/16/2022 -  Chemotherapy   Patient is on Treatment Plan : BREAST ADJUVANT  DOSE DENSE AC q14d / PACLitaxel q7d      Genetic Testing   [facility] Custom Panel+RNA was Negative. Report date is 07/10/2022.  The Custom Hereditary Cancers Panel offered by [facility] includes sequencing and/or deletion duplication testing of the following 43 genes: APC, ATM, AXIN2, BAP1, BARD1, BMPR1A, BRCA1, BRCA2, BRIP1, CDH1, CDK4, CDKN2A (p14ARF and p16INK4a only), CHEK2, CTNNA1, EPCAM (Deletion/duplication testing only), FH, GREM1 (promoter region duplication testing only), HOXB13, KIT, MBD4, MEN1, MLH1, MSH2, MSH3, MSH6, MUTYH, NF1, NHTL1, PALB2, PDGFRA, PMS2, POLD1, POLE, PTEN, RAD51C, RAD51D, SMAD4, SMARCA4. STK11, TP53, TSC1, TSC2, and VHL.       Milka is a 57 y.o. female presenting to clinic today for evaluation of metastatic breast cancer at the request of Dr. Al Pimple for a second opinion.  She presented to her PCP with a palpable right breast mass. Diagnostic mammogram and bilateral US on 06/19/22 showed: 8-9 cm mass in right breast; two abnormal right axillary lymph nodes, and a third borderline lymph node; diffuse skin thickening over right breast and along medial aspect of left breast; otherwise, no left breast abnormalities.  Biopsies on 06/22/22 revealed invasive ductal adenocarcinoma, grade 2, to the right breast mass and two lymph nodes, ER 90%+, PR 70%+, Her2- (1+), Ki67 20%.  Staging PET scan on 07/12/22 revealed: hypermetabolism to right breast mass, bilateral axillary lymph nodes, a right neck lymph node in posterior triangle, and multiple skeletal lesions; no evidence of metastatic adenopathy in  mediastinum or abdomen/pelvis or evidence of visceral metastasis.  She also underwent breast MRI on 07/13/22 showing: extensive inflammatory breast carcinoma with malignancy involving most of right breast with marked skin thickening extending to medial left breast and enhancement extending posteriorly to broadly and deeply involve right chest wall, including pectoralis, underlying intercostal  muscles, and a small area extending to underlying pleura, and medially to right internal mammary chain; 1.8 cm mass in lateral left breast; prominent but subcentimeter left axillary lymph nodes.  Of note, she had port placed on 07/16/22 and negative genetic testing on 07/04/22.  She discussed treatment options for her now metastatic breast cancer with Dr. Al Pimple on 07/17/22.  Today, she states that she is doing well overall. Her appetite level is at 100%. Her energy level is at 80%.  PAST MEDICAL HISTORY:   Past Medical History: Past Medical History:  Diagnosis Date   Depression    Erosive esophagitis 04/26/2010   EGD by Dr. Jena Gauss, small hiatal hernia   GERD (gastroesophageal reflux disease)    History of stomach ulcers    IBS (irritable bowel syndrome)    Diarrhea predominant   Microscopic colitis 04/26/2010   Colonoscopy by Dr. Jena Gauss, good response with Entocort   Tubular adenoma of colon 04/26/2010   Junction of descending and sigmoid 40 CM from anus    Surgical History: Past Surgical History:  Procedure Laterality Date   ABDOMINAL HYSTERECTOMY     APPENDECTOMY  2/11   Dr. Malvin Johns with a delayed closure   BREAST BIOPSY Right 06/22/2022   [place] RT BREAST BX W LOC DEV 1ST LESION IMG BX SPEC US GUIDE 06/22/2022 [facility] MAMMOGRAPHY   PORTACATH PLACEMENT N/A 07/16/2022   Procedure: INSERTION PORT-A-CATH;  Surgeon: Manus Rudd, MD;  Location: [facility];  Service: General;  Laterality: N/A;   TONSILLECTOMY     TUBAL LIGATION      Social History: Social History   Socioeconomic History   Marital status: Married    Spouse name: Not on file   Number of children: Not on file   Years of education: Not on file   Highest education level: Not on file  Occupational History    Employer: [employer]    Comment: Third shift  Tobacco Use   Smoking status: Every Day    Packs/day: .5    Types: Cigarettes   Smokeless tobacco: Never  Vaping Use   Vaping Use: Never used   Substance and Sexual Activity   Alcohol use: Yes    Alcohol/week: 1.0 standard drink of alcohol    Types: 1 Glasses of wine per week    Comment: seldom   Drug use: No   Sexual activity: Yes    Birth control/protection: Surgical  Other Topics Concern   Not on file  Social History Narrative   Not on file   Social Determinants of Health   Financial Resource Strain: Not on file  Food Insecurity: No Food Insecurity (07/04/2022)   Hunger Vital Sign    Worried About Running Out of Food in the Last Year: Never true    Ran Out of Food in the Last Year: Never true  Transportation Needs: No Transportation Needs (07/04/2022)   PRAPARE - Administrator, Civil Service (Medical): No    Lack of Transportation (Non-Medical): No  Physical Activity: Not on file  Stress: Not on file  Social Connections: Not on file  Intimate Partner Violence: Not on file    Family History:  Family History  Problem Relation Age of Onset   Diabetes Mother    Coronary artery disease Mother    Healthy Father     Current Medications:  Current Outpatient Medications:    anastrozole (ARIMIDEX) 1 MG tablet, Take 1 tablet (1 mg total) by mouth daily., Disp: 90 tablet, Rfl: 3   Aspirin-Acetaminophen-Caffeine (GOODY HEADACHE PO), Take 1 packet by mouth 2 (two) times daily as needed (for pain)., Disp: , Rfl:    Cholecalciferol 1.25 MG (50000 UT) capsule, Take 1 capsule by mouth once a week., Disp: , Rfl:    dexamethasone (DECADRON) 4 MG tablet, Take 2 tablets (8 mg total) by mouth daily for 3 days. Start the day after doxorubicin/cyclophosphamide chemotherapy. Take with food., Disp: 30 tablet, Rfl: 1   HYDROcodone-acetaminophen (NORCO/VICODIN) 5-325 MG tablet, Take 1 tablet by mouth every 6 (six) hours as needed for moderate pain., Disp: 15 tablet, Rfl: 0   lidocaine-prilocaine (EMLA) cream, Apply to affected area once, Disp: 30 g, Rfl: 3   ondansetron (ZOFRAN) 8 MG tablet, Take 1 tab (8 mg) by mouth every 8  hrs as needed for nausea/vomiting. Start third day after doxorubicin/cyclophosphamide chemotherapy., Disp: 30 tablet, Rfl: 1   prochlorperazine (COMPAZINE) 10 MG tablet, Take 1 tablet (10 mg total) by mouth every 6 (six) hours as needed for nausea or vomiting., Disp: 30 tablet, Rfl: 1   ribociclib succ (KISQALI 600MG  DAILY DOSE) 200 MG Therapy Pack, Take 3 tablets (600 mg total) by mouth daily. Take for 21 days on, 7 days off, repeat every 28 days., Disp: 63 tablet, Rfl: 3   Allergies: No Known Allergies  REVIEW OF SYSTEMS:   Review of Systems  Constitutional:  Negative for chills, fatigue and fever.  HENT:   Negative for lump/mass, mouth sores, nosebleeds, sore throat and trouble swallowing.   Eyes:  Negative for eye problems.  Respiratory:  Negative for cough and shortness of breath.   Cardiovascular:  Negative for chest pain, leg swelling and palpitations.  Gastrointestinal:  Negative for abdominal pain, constipation, diarrhea, nausea and vomiting.  Genitourinary:  Negative for bladder incontinence, difficulty urinating, dysuria, frequency, hematuria and nocturia.   Musculoskeletal:  Negative for arthralgias, back pain, flank pain, myalgias and neck pain.  Skin:  Negative for itching and rash.  Neurological:  Positive for headaches. Negative for dizziness and numbness.  Hematological:  Does not bruise/bleed easily.  Psychiatric/Behavioral:  Positive for sleep disturbance. Negative for depression and suicidal ideas. The patient is nervous/anxious.   All other systems reviewed and are negative.    VITALS:   Blood pressure (!) 153/92, pulse 85, temperature 98.4 F (36.9 C), temperature source Oral, resp. rate 17, height 5\' 5"  (1.651 m), weight 167 lb 6.4 oz (75.9 kg), SpO2 99 %.  Wt Readings from Last 3 Encounters:  07/25/22 167 lb 6.4 oz (75.9 kg)  07/17/22 169 lb 12.8 oz (77 kg)  07/16/22 167 lb 15.9 oz (76.2 kg)    Body mass index is 27.86 kg/m.  Performance status (ECOG): 1 -  Symptomatic but completely ambulatory  PHYSICAL EXAM:   Physical Exam Vitals and nursing note reviewed. Exam conducted with a chaperone present.  Constitutional:      Appearance: Normal appearance.  Cardiovascular:     Rate and Rhythm: Normal rate and regular rhythm.     Pulses: Normal pulses.     Heart sounds: Normal heart sounds.  Pulmonary:     Effort: Pulmonary effort is normal.     Breath sounds:  Normal breath sounds.  Abdominal:     Palpations: Abdomen is soft. There is no hepatomegaly, splenomegaly or mass.     Tenderness: There is no abdominal tenderness.  Musculoskeletal:     Right lower leg: No edema.     Left lower leg: No edema.  Lymphadenopathy:     Cervical: No cervical adenopathy.     Right cervical: No superficial, deep or posterior cervical adenopathy.    Left cervical: No superficial, deep or posterior cervical adenopathy.     Upper Body:     Right upper body: No supraclavicular or axillary adenopathy.     Left upper body: No supraclavicular or axillary adenopathy.  Neurological:     General: No focal deficit present.     Mental Status: She is alert and oriented to person, place, and time.  Psychiatric:        Mood and Affect: Mood normal.        Behavior: Behavior normal.     LABS:      Latest Ref Rng & Units 07/17/2022    9:45 AM 07/04/2022    8:04 AM 03/29/2015    4:16 PM  CBC  WBC 4.0 - 10.5 K/uL 10.5  6.4  7.5   Hemoglobin 12.0 - 15.0 g/dL 40.9  81.1  91.4   Hematocrit 36.0 - 46.0 % 39.3  42.8  40.8   Platelets 150 - 400 K/uL 251  242  234       Latest Ref Rng & Units 07/17/2022    9:45 AM 07/04/2022    8:04 AM 03/29/2015    4:16 PM  CMP  Glucose 70 - 99 mg/dL 782  956  93   BUN 6 - 20 mg/dL 15  14  12    Creatinine 0.44 - 1.00 mg/dL 2.13  0.86  5.78   Sodium 135 - 145 mmol/L 140  140  141   Potassium 3.5 - 5.1 mmol/L 3.8  3.9  3.8   Chloride 98 - 111 mmol/L 106  106  103   CO2 22 - 32 mmol/L 27  28  27    Calcium 8.9 - 10.3 mg/dL 46.9   9.9  9.4   Total Protein 6.5 - 8.1 g/dL 7.1  7.2  7.1   Total Bilirubin 0.3 - 1.2 mg/dL 0.3  0.4  0.5   Alkaline Phos 38 - 126 U/L 73  81  66   AST 15 - 41 U/L 11  17  17    ALT 0 - 44 U/L 14  17  24       No results found for: "CEA1", "CEA" / No results found for: "CEA1", "CEA" No results found for: "PSA1" No results found for: "GEX528" No results found for: "CAN125"  No results found for: "TOTALPROTELP", "ALBUMINELP", "A1GS", "A2GS", "BETS", "BETA2SER", "GAMS", "MSPIKE", "SPEI" No results found for: "TIBC", "FERRITIN", "IRONPCTSAT" No results found for: "LDH"   STUDIES:   NM PET Image Initial (PI) Skull Base To Thigh  Result Date: 07/16/2022 CLINICAL DATA:  Subsequent treatment strategy for invasive breast carcinoma.) Breast cancer. EXAM: NUCLEAR MEDICINE PET SKULL BASE TO THIGH TECHNIQUE: 8.4 mCi F-18 FDG was injected intravenously. Full-ring PET imaging was performed from the skull base to thigh after the radiotracer. CT data was obtained and used for attenuation correction and anatomic localization. Fasting blood glucose: 103 mg/dl COMPARISON:  None Available. FINDINGS: Mediastinal blood pool activity: SUV max Liver activity: SUV max NA NECK: No hypermetabolic lymph nodes in the neck. Incidental CT  findings: None. CHEST: Intensely hypermetabolic mass in the RIGHT breast measures 6.0 cm with SUV max equal 9.2. There is hypermetabolic skin thickening in the RIGHT breast superficial to mass. Cluster of hypermetabolic RIGHT axillary lymph nodes surround a biopsy clip with SUV max equal 10.75. Metastatic lymph node in the posterior triangle of the RIGHT neck with SUV max equal 4.0 on image 68. Hypermetabolic LEFT axillary lymph nodes are small but intense with SUV max equal 3.8. No hypermetabolic mediastinal lymph nodes. No hypermetabolic internal mammary nodes. No suspicious pulmonary nodules. Incidental CT findings: None. ABDOMEN/PELVIS: No abnormal metabolic activity liver. Adrenal glands  normal. No hypermetabolic lymph nodes in the abdomen pelvis. Post hysterectomy.  Adnexa unremarkable Incidental CT findings: None. SKELETON: Multiple foci of intensely radiotracer avid skeletal metastasis. These metastatic lesions are predominantly occult by CT imaging. For example broad lesion in the LEFT sacral ala measures approximately 2.5 cm SUV max equal 10.2. No CT change. Lesion in the posterior LEFT iliac bone with SUV max equal 7.1 on image 201 no CT change. Metastatic lesions noted in the L4 and T12 vertebral bodies. Multiple rib lesions are present. LEFT scapular lesion with SUV max equal 4.5 on image 96. Incidental CT findings: None. IMPRESSION: 1. Intensely hypermetabolic RIGHT breast mass consistent with primary breast carcinoma. 2. Hypermetabolic metastatic RIGHT axillary lymph nodes. 3. Hypermetabolic metastatic lymph node in the posterior triangle of the RIGHT neck. 4. Hypermetabolic LEFT axillary lymph nodes. 5. No evidence of metastatic adenopathy in the mediastinum or abdomen pelvis. 6. No evidence of visceral metastasis. 7. Multiple intensely hypermetabolic skeletal metastasis. Electronically Signed   By: Genevive Bi M.D.   On: 07/16/2022 15:45   MR BREAST BILATERAL W WO CONTRAST INC CAD  Addendum Date: 07/16/2022   ADDENDUM REPORT: 07/16/2022 12:31 ADDENDUM: Laterality error in the body the original report. In the right breast section, the large irregular mass is in the central right breast, not the left. Electronically Signed   By: Amie Portland M.D.   On: 07/16/2022 12:31   Result Date: 07/16/2022 CLINICAL DATA:  Recently diagnosed invasive right breast carcinoma. A large right breast mass was biopsied as well as a lower right axilla mass and an abnormal right axillary lymph nodes, all positive for malignancy. Patient has clinical findings of inflammatory breast carcinoma with skin thickening that extends to the medial left breast. Current exam is for staging pre neoadjuvant  chemotherapy assessment. EXAM: BILATERAL BREAST MRI WITH AND WITHOUT CONTRAST TECHNIQUE: Multiplanar, multisequence MR images of both breasts were obtained prior to and following the intravenous administration of 7 ml of Gadavist Three-dimensional MR images were rendered by post-processing of the original MR data on an independent workstation. The three-dimensional MR images were interpreted, and findings are reported in the following complete MRI report for this study. Three dimensional images were evaluated at the independent interpreting workstation using the DynaCAD thin client. COMPARISON:  Prior exams. FINDINGS: Breast composition: c. Heterogeneous fibroglandular tissue. Background parenchymal enhancement: Mild Right breast: There is extensive abnormal enhancement throughout most of the right breast, as well as skin thickening up to 1 cm and diffuse skin enhancement. The largest portion of the irregular mass, centered in the central left breast, but extending to all 4 quadrants, measures approximately 4.2 x 6.8 x 6.7 cm (AP x TRV x SI). Contiguous with this mass, in the upper outer quadrant, is an additional irregular mass measuring 2.8 x 2.4 x 3.0 cm (AP x TRV x SI). In the axillary tail region, a  third ill-defined mass is noted measuring 3.3 x 3.2 x 4.1 cm (AP x TRV x SI). In addition to these masses, multiple smaller ill-defined masses are noted as well as enhancing thickened trabecula throughout the right breast. Abnormal enhancement extends to involve much of the right pectoralis muscle, extending posterior to the pectoralis into the deep chest wall with a focus of enhancement noted extending to the underlying anterior right upper lobe pleura. There is contiguous enhancement along the right internal mammary chain, which may reflect an abnormal node. Susceptibility artifact from the venous shaped post biopsy marker clip lies in the upper outer right breast. Artifact from the coil shaped clip in the right  axilla mass is also noted, but less apparent. Left breast: There is an ill-defined masslike area of enhancement in the lateral left breast near 3 o'clock, middle to posterior depth, measuring 1.8 x 1.1 x 1.4 cm (AP x TRV x SI). No other left breast masses. Abnormal skin thickening and enhancement is noted along the medial aspect of the left breast, with underlying trabecular thickening and enhancement. Lymph nodes: No defined abnormal right axillary lymph nodes. There are enhancing prominent left axillary lymph nodes measuring up to 9 mm in short axis. On review of the current PET CT, these demonstrate hypermetabolism Ancillary findings:  None. IMPRESSION: 1. Extensive inflammatory breast carcinoma on the right with malignancy involving most of the right breast with marked skin thickening and enhancement. Abnormal enhancement extends posteriorly to broadly and deeply involve the right chest wall including the pectoralis and underlying intercostal muscles with a small area of abnormal enhancement extending to the underlying pleura. Enhancement extends medially to the right internal mammary chain. 2. 1.8 cm ill-defined enhancing mass in the lateral left breast suspicious for additional malignancy. 3. Left medial breast skin thickening and underlying trabecular thickening with associated abnormal enhancement suspicious for contralateral extension of inflammatory breast carcinoma. 4. Prominent, but subcentimeter, left axillary lymph nodes are associated with hypermetabolism on PET. These suspicious for metastatic disease. RECOMMENDATION: 1. MRI guided core needle biopsy of the 1.8 cm enhancing left breast mass, image 60, series 11. BI-RADS CATEGORY  4: Suspicious. Electronically Signed: By: Amie Portland M.D. On: 07/16/2022 10:59  DG CHEST PORT 1 VIEW  Result Date: 07/16/2022 CLINICAL DATA:  [id number] Encounter for central line placement [id number] EXAM: PORTABLE CHEST - 1 VIEW COMPARISON:  PET-CT 07/12/2022 FINDINGS:  Right IJ port catheter to the proximal right atrium. No pneumothorax. Relatively low lung volumes with mild prominence of interstitial markings right greater than left. No convincing confluent airspace disease. Heart size and mediastinal contours are within normal limits. No effusion. Visualized bones unremarkable. IMPRESSION: Right IJ port catheter to the proximal right atrium. No pneumothorax. Electronically Signed   By: Corlis Leak M.D.   On: 07/16/2022 10:49   DG C-Arm 1-60 Min-No Report  Result Date: 07/16/2022 Fluoroscopy was utilized by the requesting physician.  No radiographic interpretation.   ECHOCARDIOGRAM COMPLETE  Result Date: 07/05/2022    ECHOCARDIOGRAM REPORT   Patient Name:   TRYNITI MOORER Date of Exam: 07/05/2022 Medical Rec #:  [id number]     Height:       65.0 in Accession #:    [id number]    Weight:       170.5 lb Date of Birth:  20-Sep-1965     BSA:          1.848 m Patient Age:    57 years      BP:  127/84 mmHg Patient Gender: F             HR:           90 bpm. Exam Location:  Outpatient Procedure: 2D Echo, Color Doppler and Cardiac Doppler Indications:    Z09 Chemo  History:        Patient has no prior history of Echocardiogram examinations. No                 Cardiac History; Risk Factors:Current Smoker.  Sonographer:    L. Thornton-Maynard Referring Phys: Rachel Moulds  Sonographer Comments: Suboptimal apical window. Suboptimal images due to patient's extensive smoking history. Global longitudinal strain was attempted. IMPRESSIONS  1. Left ventricular ejection fraction, by estimation, is 60 to 65%. The left ventricle has normal function. The left ventricle has no regional wall motion abnormalities. There is mild left ventricular hypertrophy. Left ventricular diastolic parameters are consistent with Grade I diastolic dysfunction (impaired relaxation).  2. Right ventricular systolic function is normal. The right ventricular size is normal.  3. The mitral valve is normal in  structure. No evidence of mitral valve regurgitation. No evidence of mitral stenosis.  4. The aortic valve is normal in structure. Aortic valve regurgitation is not visualized. No aortic stenosis is present.  5. The inferior vena cava is normal in size with greater than 50% respiratory variability, suggesting right atrial pressure of 3 mmHg. FINDINGS  Left Ventricle: Left ventricular ejection fraction, by estimation, is 60 to 65%. The left ventricle has normal function. The left ventricle has no regional wall motion abnormalities. Global longitudinal strain performed but not reported based on interpreter judgement due to suboptimal tracking. The left ventricular internal cavity size was normal in size. There is mild left ventricular hypertrophy. Left ventricular diastolic parameters are consistent with Grade I diastolic dysfunction (impaired relaxation). Right Ventricle: The right ventricular size is normal. No increase in right ventricular wall thickness. Right ventricular systolic function is normal. Left Atrium: Left atrial size was normal in size. Right Atrium: Right atrial size was normal in size. Pericardium: There is no evidence of pericardial effusion. Mitral Valve: The mitral valve is normal in structure. No evidence of mitral valve regurgitation. No evidence of mitral valve stenosis. MV peak gradient, 4.1 mmHg. The mean mitral valve gradient is 2.0 mmHg. Tricuspid Valve: The tricuspid valve is normal in structure. Tricuspid valve regurgitation is not demonstrated. No evidence of tricuspid stenosis. Aortic Valve: The aortic valve is normal in structure. Aortic valve regurgitation is not visualized. No aortic stenosis is present. Aortic valve mean gradient measures 4.0 mmHg. Aortic valve peak gradient measures 6.7 mmHg. Aortic valve area, by VTI measures 3.14 cm. Pulmonic Valve: The pulmonic valve was normal in structure. Pulmonic valve regurgitation is not visualized. No evidence of pulmonic stenosis.  Aorta: The aortic root is normal in size and structure. Venous: The inferior vena cava is normal in size with greater than 50% respiratory variability, suggesting right atrial pressure of 3 mmHg. IAS/Shunts: No atrial level shunt detected by color flow Doppler.  LEFT VENTRICLE PLAX 2D LVIDd:         4.00 cm     Diastology LVIDs:         2.60 cm     LV e' medial:    5.77 cm/s LV PW:         1.30 cm     LV E/e' medial:  9.5 LV IVS:        1.20 cm  LV e' lateral:   16.30 cm/s LVOT diam:     2.20 cm     LV E/e' lateral: 3.4 LV SV:         67 LV SV Index:   36 LVOT Area:     3.80 cm  LV Volumes (MOD) LV vol d, MOD A2C: 55.2 ml LV vol d, MOD A4C: 54.6 ml LV vol s, MOD A2C: 18.7 ml LV vol s, MOD A4C: 20.9 ml LV SV MOD A2C:     36.5 ml LV SV MOD A4C:     54.6 ml LV SV MOD BP:      39.8 ml RIGHT VENTRICLE            IVC RV S prime:     9.79 cm/s  IVC diam: 2.20 cm TAPSE (M-mode): 2.4 cm LEFT ATRIUM             Index        RIGHT ATRIUM           Index LA diam:        3.10 cm 1.68 cm/m   RA Area:     13.00 cm LA Vol (A2C):   56.2 ml 30.40 ml/m  RA Volume:   27.80 ml  15.04 ml/m LA Vol (A4C):   66.6 ml 36.03 ml/m LA Biplane Vol: 62.8 ml 33.98 ml/m  AORTIC VALVE                    PULMONIC VALVE AV Area (Vmax):    3.12 cm     PV Vmax:       1.24 m/s AV Area (Vmean):   3.07 cm     PV Peak grad:  6.2 mmHg AV Area (VTI):     3.14 cm AV Vmax:           129.00 cm/s AV Vmean:          88.200 cm/s AV VTI:            0.214 m AV Peak Grad:      6.7 mmHg AV Mean Grad:      4.0 mmHg LVOT Vmax:         106.00 cm/s LVOT Vmean:        71.200 cm/s LVOT VTI:          0.177 m LVOT/AV VTI ratio: 0.83  AORTA Ao Root diam: 3.60 cm Ao Asc diam:  3.40 cm MITRAL VALVE MV Area (PHT): 7.16 cm    SHUNTS MV Area VTI:   4.54 cm    Systemic VTI:  0.18 m MV Peak grad:  4.1 mmHg    Systemic Diam: 2.20 cm MV Mean grad:  2.0 mmHg MV Vmax:       1.01 m/s MV Vmean:      68.5 cm/s MV Decel Time: 106 msec MV E velocity: 54.90 cm/s MV A velocity:  96.40 cm/s MV E/A ratio:  0.57 Donato Schultz MD Electronically signed by Donato Schultz MD Signature Date/Time: 07/05/2022/1:31:11 PM    Final

## 2022-07-25 ENCOUNTER — Inpatient Hospital Stay (HOSPITAL_BASED_OUTPATIENT_CLINIC_OR_DEPARTMENT_OTHER): Payer: 59 | Admitting: Hematology

## 2022-07-25 VITALS — BP 153/92 | HR 85 | Temp 98.4°F | Resp 17 | Ht 65.0 in | Wt 167.4 lb

## 2022-07-25 DIAGNOSIS — Z86010 Personal history of colonic polyps: Secondary | ICD-10-CM

## 2022-07-25 DIAGNOSIS — C773 Secondary and unspecified malignant neoplasm of axilla and upper limb lymph nodes: Secondary | ICD-10-CM

## 2022-07-25 DIAGNOSIS — Z833 Family history of diabetes mellitus: Secondary | ICD-10-CM

## 2022-07-25 DIAGNOSIS — Z79899 Other long term (current) drug therapy: Secondary | ICD-10-CM

## 2022-07-25 DIAGNOSIS — F17210 Nicotine dependence, cigarettes, uncomplicated: Secondary | ICD-10-CM

## 2022-07-25 DIAGNOSIS — M533 Sacrococcygeal disorders, not elsewhere classified: Secondary | ICD-10-CM

## 2022-07-25 DIAGNOSIS — G479 Sleep disorder, unspecified: Secondary | ICD-10-CM

## 2022-07-25 DIAGNOSIS — Z170 Estrogen receptor positive status [ER+]: Secondary | ICD-10-CM

## 2022-07-25 DIAGNOSIS — C50411 Malignant neoplasm of upper-outer quadrant of right female breast: Secondary | ICD-10-CM | POA: Diagnosis not present

## 2022-07-25 DIAGNOSIS — M25552 Pain in left hip: Secondary | ICD-10-CM

## 2022-07-25 DIAGNOSIS — C7951 Secondary malignant neoplasm of bone: Secondary | ICD-10-CM | POA: Diagnosis not present

## 2022-07-25 DIAGNOSIS — Z9049 Acquired absence of other specified parts of digestive tract: Secondary | ICD-10-CM

## 2022-07-25 DIAGNOSIS — Z90710 Acquired absence of both cervix and uterus: Secondary | ICD-10-CM

## 2022-07-25 DIAGNOSIS — R519 Headache, unspecified: Secondary | ICD-10-CM

## 2022-07-25 DIAGNOSIS — R234 Changes in skin texture: Secondary | ICD-10-CM

## 2022-07-25 DIAGNOSIS — Z79811 Long term (current) use of aromatase inhibitors: Secondary | ICD-10-CM

## 2022-07-25 DIAGNOSIS — Z8249 Family history of ischemic heart disease and other diseases of the circulatory system: Secondary | ICD-10-CM

## 2022-07-25 NOTE — Patient Instructions (Addendum)
[facility]  Discharge Instructions  You were seen and examined today by Dr. Ellin Saba. Dr. Ellin Saba is a medical oncologist, meaning that he specializes in the treatment of cancer diagnoses. Dr. Ellin Saba discussed your past medical history, family history of cancers, and the events that led to you being here today.  You were referred to Dr. Ellin Saba for ongoing management of your breast cancer. The breast cancer is considered Stage IV because it has spread beyond the breast and to the lymph nodes and bones.  Dr. Ellin Saba has recommended taking Anastrozole and Kisqali as prescribed. On the Kisqali, you do need an EKG every 2 weeks for the first couple of months while you are taking the treatment.  Because the cancer has impacted your bones, you are at an increased risk for fractures. For this reason, it is recommended that you get a medication to assess your bones. This is Zometa. It is given every 12 weeks via infusion to protect your bones.  On average, pills take an average of approximately 4 months to get visible response. At that time, we hope to be able to reevaluate removal of the mass on your breast.  You will be on some form of therapy long term. You will remain on the current treatment regimen for as long as it helps.  Follow-up as scheduled.  Thank you for choosing [facility] to provide your oncology and hematology care.   To afford each patient quality time with our provider, please arrive at least 15 minutes before your scheduled appointment time. You may need to reschedule your appointment if you arrive late (10 or more minutes). Arriving late affects you and other patients whose appointments are after yours.  Also, if you miss three or more appointments without notifying the office, you may be dismissed from the clinic at the provider's discretion.    Again, thank you for choosing [facility].  Our hope  is that these requests will decrease the amount of time that you wait before being seen by our physicians.   If you have a lab appointment with the Cancer Center - please note that after April 8th, all labs will be drawn in the cancer center.  You do not have to check in or register with the main entrance as you have in the past but will complete your check-in at the cancer center.            _____________________________________________________________  Should you have questions after your visit to [facility], please contact our office at [phone] and follow the prompts.  Our office hours are 8:00 a.m. to 4:30 p.m. Monday - Thursday and 8:00 a.m. to 2:30 p.m. Friday.  Please note that voicemails left after 4:00 p.m. may not be returned until the following business day.  We are closed weekends and all major holidays.  You do have access to a nurse 24-7, just call the main number to the clinic [phone] and do not press any options, hold on the line and a nurse will answer the phone.    For prescription refill requests, have your pharmacy contact our office and allow 72 hours.    Masks are no longer required in the cancer centers. If you would like for your care team to wear a mask while they are taking care of you, please let them know. You may have one support person who is at least 56 years old  accompany you for your appointments.

## 2022-07-26 ENCOUNTER — Other Ambulatory Visit: Payer: Self-pay

## 2022-07-26 DIAGNOSIS — Z170 Estrogen receptor positive status [ER+]: Secondary | ICD-10-CM

## 2022-07-26 DIAGNOSIS — C50411 Malignant neoplasm of upper-outer quadrant of right female breast: Secondary | ICD-10-CM

## 2022-07-26 NOTE — Research (Signed)
Z6109, ICE COMPRESS: RANDOMIZED TRIAL OF LIMB CRYOCOMPRESSION VERSUS CONTINUOUS COMPRESSION VERSUS LOW CYCLIC COMPRESSION FOR THE PREVENTION  OF TAXANE-INDUCED PERIPHERAL NEUROPATHY  Patient has transferred care to [facility] and has had her tx plan changed, so she is no longer a candidate for the above study. She will be removed from screening.  Margret Chance Mckade Gurka, RN, BSN, [facility]  [facility]  [facility] Clinical Research Nurse [facility] Direct Dial [phone]  Pager [phone] 07/26/2022 1:12 PM

## 2022-07-26 NOTE — Progress Notes (Signed)
Lab orders and EKG orders placed per Dr. Ellin Saba

## 2022-07-27 ENCOUNTER — Other Ambulatory Visit: Payer: Self-pay

## 2022-07-27 ENCOUNTER — Inpatient Hospital Stay: Payer: 59

## 2022-07-27 VITALS — BP 139/79 | HR 75 | Temp 98.0°F | Resp 18

## 2022-07-27 DIAGNOSIS — C50411 Malignant neoplasm of upper-outer quadrant of right female breast: Secondary | ICD-10-CM | POA: Diagnosis not present

## 2022-07-27 MED ORDER — ZOLEDRONIC ACID 4 MG/100ML IV SOLN
4.0000 mg | Freq: Once | INTRAVENOUS | Status: AC
  Administered 2022-07-27: 4 mg via INTRAVENOUS
  Filled 2022-07-27: qty 100

## 2022-07-27 MED ORDER — SODIUM CHLORIDE 0.9% FLUSH
10.0000 mL | Freq: Once | INTRAVENOUS | Status: AC | PRN
  Administered 2022-07-27: 10 mL

## 2022-07-27 MED ORDER — HEPARIN SOD (PORK) LOCK FLUSH 100 UNIT/ML IV SOLN
500.0000 [IU] | Freq: Once | INTRAVENOUS | Status: AC | PRN
  Administered 2022-07-27: 500 [IU]

## 2022-07-27 MED ORDER — SODIUM CHLORIDE 0.9 % IV SOLN: Freq: Once | INTRAVENOUS | Status: AC

## 2022-07-27 NOTE — Patient Instructions (Signed)
[facility]  Discharge Instructions: Thank you for choosing [facility] to provide your oncology and hematology care.  If you have a lab appointment with the Cancer Center - please note that after April 8th, 2024, all labs will be drawn in the cancer center.  You do not have to check in or register with the main entrance as you have in the past but will complete your check-in in the cancer center.  Wear comfortable clothing and clothing appropriate for easy access to any Portacath or PICC line.   We strive to give you quality time with your provider. You may need to reschedule your appointment if you arrive late (15 or more minutes).  Arriving late affects you and other patients whose appointments are after yours.  Also, if you miss three or more appointments without notifying the office, you may be dismissed from the clinic at the provider's discretion.      For prescription refill requests, have your pharmacy contact our office and allow 72 hours for refills to be completed.    Today you received the following chemotherapy and/or immunotherapy agents Zometa.  Zoledronic Acid Injection (Cancer) What is this medication? ZOLEDRONIC ACID (ZOE le dron ik AS id) treats high calcium levels in the blood caused by cancer. It may also be used with chemotherapy to treat weakened bones caused by cancer. It works by slowing down the release of calcium from bones. This lowers calcium levels in your blood. It also makes your bones stronger and less likely to break (fracture). It belongs to a group of medications called bisphosphonates. This medicine may be used for other purposes; ask your health care provider or pharmacist if you have questions. COMMON BRAND NAME(S): Zometa, Zometa Powder What should I tell my care team before I take this medication? They need to know if you have any of these conditions: Dehydration Dental disease Kidney disease Liver disease Low levels  of calcium in the blood Lung or breathing disease, such as asthma Receiving steroids, such as dexamethasone or prednisone An unusual or allergic reaction to zoledronic acid, other medications, foods, dyes, or preservatives Pregnant or trying to get pregnant Breast-feeding How should I use this medication? This medication is injected into a vein. It is given by your care team in a hospital or clinic setting. Talk to your care team about the use of this medication in children. Special care may be needed. Overdosage: If you think you have taken too much of this medicine contact a poison control center or emergency room at once. NOTE: This medicine is only for you. Do not share this medicine with others. What if I miss a dose? Keep appointments for follow-up doses. It is important not to miss your dose. Call your care team if you are unable to keep an appointment. What may interact with this medication? Certain antibiotics given by injection Diuretics, such as bumetanide, furosemide NSAIDs, medications for pain and inflammation, such as ibuprofen or naproxen Teriparatide Thalidomide This list may not describe all possible interactions. Give your health care provider a list of all the medicines, herbs, non-prescription drugs, or dietary supplements you use. Also tell them if you smoke, drink alcohol, or use illegal drugs. Some items may interact with your medicine. What should I watch for while using this medication? Visit your care team for regular checks on your progress. It may be some time before you see the benefit from this medication. Some people who take this medication have   severe bone, joint, or muscle pain. This medication may also increase your risk for jaw problems or a broken thigh bone. Tell your care team right away if you have severe pain in your jaw, bones, joints, or muscles. Tell you care team if you have any pain that does not go away or that gets worse. Tell your dentist and  dental surgeon that you are taking this medication. You should not have major dental surgery while on this medication. See your dentist to have a dental exam and fix any dental problems before starting this medication. Take good care of your teeth while on this medication. Make sure you see your dentist for regular follow-up appointments. You should make sure you get enough calcium and vitamin D while you are taking this medication. Discuss the foods you eat and the vitamins you take with your care team. Check with your care team if you have severe diarrhea, nausea, and vomiting, or if you sweat a lot. The loss of too much body fluid may make it dangerous for you to take this medication. You may need bloodwork while taking this medication. Talk to your care team if you wish to become pregnant or think you might be pregnant. This medication can cause serious birth defects. What side effects may I notice from receiving this medication? Side effects that you should report to your care team as soon as possible: Allergic reactions--skin rash, itching, hives, swelling of the face, lips, tongue, or throat Kidney injury--decrease in the amount of urine, swelling of the ankles, hands, or feet Low calcium level--muscle pain or cramps, confusion, tingling, or numbness in the hands or feet Osteonecrosis of the jaw--pain, swelling, or redness in the mouth, numbness of the jaw, poor healing after dental work, unusual discharge from the mouth, visible bones in the mouth Severe bone, joint, or muscle pain Side effects that usually do not require medical attention (report to your care team if they continue or are bothersome): Constipation Fatigue Fever Loss of appetite Nausea Stomach pain This list may not describe all possible side effects. Call your doctor for medical advice about side effects. You may report side effects to FDA at 1-800-FDA-1088. Where should I keep my medication? This medication is given in  a hospital or clinic. It will not be stored at home. NOTE: This sheet is a summary. It may not cover all possible information. If you have questions about this medicine, talk to your doctor, pharmacist, or health care provider.  2024 Elsevier/Gold Standard (2021-03-17 00:00:00)        To help prevent nausea and vomiting after your treatment, we encourage you to take your nausea medication as directed.  BELOW ARE SYMPTOMS THAT SHOULD BE REPORTED IMMEDIATELY: *FEVER GREATER THAN 100.4 F (38 C) OR HIGHER *CHILLS OR SWEATING *NAUSEA AND VOMITING THAT IS NOT CONTROLLED WITH YOUR NAUSEA MEDICATION *UNUSUAL SHORTNESS OF BREATH *UNUSUAL BRUISING OR BLEEDING *URINARY PROBLEMS (pain or burning when urinating, or frequent urination) *BOWEL PROBLEMS (unusual diarrhea, constipation, pain near the anus) TENDERNESS IN MOUTH AND THROAT WITH OR WITHOUT PRESENCE OF ULCERS (sore throat, sores in mouth, or a toothache) UNUSUAL RASH, SWELLING OR PAIN  UNUSUAL VAGINAL DISCHARGE OR ITCHING   Items with * indicate a potential emergency and should be followed up as soon as possible or go to the Emergency Department if any problems should occur.  Please show the CHEMOTHERAPY ALERT CARD or IMMUNOTHERAPY ALERT CARD at check-in to the Emergency Department and triage nurse.  Should you have   questions after your visit or need to cancel or reschedule your appointment, please contact [facility] [phone]  and follow the prompts.  Office hours are 8:00 a.m. to 4:30 p.m. Monday - Friday. Please note that voicemails left after 4:00 p.m. may not be returned until the following business day.  We are closed weekends and major holidays. You have access to a nurse at all times for urgent questions. Please call the main number to the clinic [phone] and follow the prompts.  For any non-urgent questions, you may also contact your provider using MyChart. We now offer e-Visits for anyone 18 and older to  request care online for non-urgent symptoms. For details visit [email or web address].   Also download the MyChart app! Go to the app store, search "MyChart", open the app, select [facility], and log in with your MyChart username and password.   

## 2022-07-27 NOTE — Progress Notes (Signed)
Patient presents today for Zometa infusion.  Port accessed, without any complications, good blood return noted. Patient is in satisfactory condition with no new complaints voiced.  Vital signs are stable.  We will proceed with infusion per provider orders.    Patient tolerated treatment well with no complaints voiced.  Patient left ambulatory in stable condition.  Vital signs stable at discharge.  Follow up as scheduled.

## 2022-07-30 ENCOUNTER — Ambulatory Visit: Payer: 59

## 2022-07-30 ENCOUNTER — Other Ambulatory Visit: Payer: Self-pay

## 2022-07-30 ENCOUNTER — Other Ambulatory Visit: Payer: 59

## 2022-08-04 ENCOUNTER — Other Ambulatory Visit: Payer: Self-pay

## 2022-08-06 ENCOUNTER — Encounter: Payer: Self-pay | Admitting: Genetic Counselor

## 2022-08-06 ENCOUNTER — Ambulatory Visit: Payer: Self-pay | Admitting: Genetic Counselor

## 2022-08-06 DIAGNOSIS — Z1379 Encounter for other screening for genetic and chromosomal anomalies: Secondary | ICD-10-CM

## 2022-08-06 NOTE — Progress Notes (Signed)
HPI:   [name] was previously seen in the [facility] clinic due to a personal history of cancer and concerns regarding a hereditary predisposition to cancer. Please refer to our prior cancer genetics clinic note for more information regarding our discussion, assessment and recommendations, at the time. [name] recent genetic test results were disclosed to her, as were recommendations warranted by these results. These results and recommendations are discussed in more detail below.  CANCER HISTORY:  Oncology History  Malignant neoplasm of upper-outer quadrant of right breast in female, estrogen receptor positive (HCC)  06/19/2022 Mammogram   Mammogram showed large mass in the right breast which appears to measure 8 to 9 cm on ultrasound, 2 abnormal right axillary lymph node and a third borderline lymph node.  Skin thickening noted along the medial aspect of left breast which may be an extension of the marked skin thickening diffusely on the right.  Otherwise no left breast abnormalities are identified.   06/22/2022 Pathology Results   Needle core biopsy from the right breast upper outer quadrant showed grade 2 IDC, both lymph nodes with metastatic adenocarcinoma.  Prognostic showed ER 90% positive strong staining PR 70% positive strong staining Ki-67 of 20% HER2 negative by IHC 1+   07/03/2022 Initial Diagnosis   Malignant neoplasm of upper-outer quadrant of right breast in female, estrogen receptor positive (HCC)   07/04/2022 Cancer Staging   Staging form: Breast, AJCC 8th Edition - Clinical: Stage IV (cT4, cN2, cM1, G2, ER+, PR+, HER2-) - Signed by Rachel Moulds, MD on 07/17/2022 Stage prefix: Initial diagnosis Histologic grading system: 3 grade system   07/16/2022 -  Chemotherapy   Patient is on Treatment Plan : BREAST ADJUVANT DOSE DENSE AC q14d / PACLitaxel q7d      Genetic Testing   [facility] Custom Panel+RNA was Negative. Report date is 07/10/2022.  The Custom  Hereditary Cancers Panel offered by [facility] includes sequencing and/or deletion duplication testing of the following 43 genes: APC, ATM, AXIN2, BAP1, BARD1, BMPR1A, BRCA1, BRCA2, BRIP1, CDH1, CDK4, CDKN2A (p14ARF and p16INK4a only), CHEK2, CTNNA1, EPCAM (Deletion/duplication testing only), FH, GREM1 (promoter region duplication testing only), HOXB13, KIT, MBD4, MEN1, MLH1, MSH2, MSH3, MSH6, MUTYH, NF1, NHTL1, PALB2, PDGFRA, PMS2, POLD1, POLE, PTEN, RAD51C, RAD51D, SMAD4, SMARCA4. STK11, TP53, TSC1, TSC2, and VHL.     FAMILY HISTORY:  We obtained a detailed, 4-generation family history.  [name] is unaware of previous family history of cancer and/or genetic testing for hereditary cancer risks. There is no reported Ashkenazi Jewish ancestry.          GENETIC TEST RESULTS:  The [facility] Custom Panel found no pathogenic mutations.   The Custom Hereditary Cancers Panel offered by [facility] includes sequencing and/or deletion duplication testing of the following 43 genes: APC, ATM, AXIN2, BAP1, BARD1, BMPR1A, BRCA1, BRCA2, BRIP1, CDH1, CDK4, CDKN2A (p14ARF and p16INK4a only), CHEK2, CTNNA1, EPCAM (Deletion/duplication testing only), FH, GREM1 (promoter region duplication testing only), HOXB13, KIT, MBD4, MEN1, MLH1, MSH2, MSH3, MSH6, MUTYH, NF1, NHTL1, PALB2, PDGFRA, PMS2, POLD1, POLE, PTEN, RAD51C, RAD51D, SMAD4, SMARCA4. STK11, TP53, TSC1, TSC2, and VHL.  The test report has been scanned into EPIC and is located under the Molecular Pathology section of the Results Review tab.  A portion of the result report is included below for reference. Genetic testing reported out on 07/10/2022.     Even though a pathogenic variant was not identified, possible explanations for the cancer in the family may include: There may be no hereditary  risk for cancer in the family. Her personal history of cancer may be due to other genetic or environmental factors. There may be a gene mutation in one of these genes that  current testing methods cannot detect, but that chance is small. There could be another gene that has not yet been discovered, or that we have not yet tested, that is responsible for the cancer diagnoses in the family.   Therefore, it is important to remain in touch with cancer genetics in the future so that we can continue to offer [name] the most up to date genetic testing.   ADDITIONAL GENETIC TESTING:  We discussed with [name] that her genetic testing was fairly extensive.  If there are genes identified to increase cancer risk that can be analyzed in the future, we would be happy to discuss and coordinate this testing at that time.    CANCER SCREENING RECOMMENDATIONS:  [name] test result is considered negative (normal).  This means that we have not identified a hereditary cause for her personal history of cancer at this time. Most cancers happen by chance and this negative test suggests that her cancer may fall into this category.    An individual's cancer risk and medical management are not determined by genetic test results alone. Overall cancer risk assessment incorporates additional factors, including personal medical history, family history, and any available genetic information that may result in a personalized plan for cancer prevention and surveillance. Therefore, it is recommended she continue to follow the cancer management and screening guidelines provided by her oncology and primary healthcare provider.  RECOMMENDATIONS FOR FAMILY MEMBERS:   Since she did not inherit a mutation in a cancer predisposition gene included on this panel, her children could not have inherited a mutation from her in one of these genes. Individuals in this family might be at some increased risk of developing cancer, over the general population risk, due to the family history of cancer. We recommend women in this family have a yearly mammogram beginning at age 70, or 48 years younger than the  earliest onset of cancer, an annual clinical breast exam, and perform monthly breast self-exams.  FOLLOW-UP:  Cancer genetics is a rapidly advancing field and it is possible that new genetic tests will be appropriate for her and/or her family members in the future. We encouraged her to remain in contact with cancer genetics on an annual basis so we can update her personal and family histories and let her know of advances in cancer genetics that may benefit this family.   Our contact number was provided. [name] questions were answered to her satisfaction, and she knows she is welcome to call us at anytime with additional questions or concerns.   Lalla Brothers, MS, [facility] Genetic Counselor [email or web address] (P) [phone]

## 2022-08-08 ENCOUNTER — Inpatient Hospital Stay: Payer: 59 | Attending: Hematology and Oncology

## 2022-08-08 ENCOUNTER — Ambulatory Visit: Payer: 59

## 2022-08-08 ENCOUNTER — Inpatient Hospital Stay (HOSPITAL_BASED_OUTPATIENT_CLINIC_OR_DEPARTMENT_OTHER): Payer: 59 | Admitting: Hematology

## 2022-08-08 ENCOUNTER — Other Ambulatory Visit: Payer: 59

## 2022-08-08 ENCOUNTER — Ambulatory Visit: Payer: 59 | Admitting: Hematology

## 2022-08-08 DIAGNOSIS — Z90710 Acquired absence of both cervix and uterus: Secondary | ICD-10-CM | POA: Diagnosis not present

## 2022-08-08 DIAGNOSIS — R197 Diarrhea, unspecified: Secondary | ICD-10-CM | POA: Insufficient documentation

## 2022-08-08 DIAGNOSIS — Z86010 Personal history of colonic polyps: Secondary | ICD-10-CM | POA: Diagnosis not present

## 2022-08-08 DIAGNOSIS — D72819 Decreased white blood cell count, unspecified: Secondary | ICD-10-CM | POA: Diagnosis not present

## 2022-08-08 DIAGNOSIS — Z8249 Family history of ischemic heart disease and other diseases of the circulatory system: Secondary | ICD-10-CM | POA: Diagnosis not present

## 2022-08-08 DIAGNOSIS — M7989 Other specified soft tissue disorders: Secondary | ICD-10-CM | POA: Insufficient documentation

## 2022-08-08 DIAGNOSIS — Z833 Family history of diabetes mellitus: Secondary | ICD-10-CM | POA: Diagnosis not present

## 2022-08-08 DIAGNOSIS — C7951 Secondary malignant neoplasm of bone: Secondary | ICD-10-CM | POA: Insufficient documentation

## 2022-08-08 DIAGNOSIS — N6331 Unspecified lump in axillary tail of the right breast: Secondary | ICD-10-CM | POA: Diagnosis not present

## 2022-08-08 DIAGNOSIS — Z9049 Acquired absence of other specified parts of digestive tract: Secondary | ICD-10-CM | POA: Insufficient documentation

## 2022-08-08 DIAGNOSIS — Z79811 Long term (current) use of aromatase inhibitors: Secondary | ICD-10-CM | POA: Insufficient documentation

## 2022-08-08 DIAGNOSIS — Z79899 Other long term (current) drug therapy: Secondary | ICD-10-CM | POA: Diagnosis not present

## 2022-08-08 DIAGNOSIS — F17210 Nicotine dependence, cigarettes, uncomplicated: Secondary | ICD-10-CM | POA: Insufficient documentation

## 2022-08-08 DIAGNOSIS — M533 Sacrococcygeal disorders, not elsewhere classified: Secondary | ICD-10-CM | POA: Diagnosis not present

## 2022-08-08 DIAGNOSIS — Z170 Estrogen receptor positive status [ER+]: Secondary | ICD-10-CM | POA: Diagnosis not present

## 2022-08-08 DIAGNOSIS — Z79630 Long term (current) use of alkylating agent: Secondary | ICD-10-CM | POA: Diagnosis not present

## 2022-08-08 DIAGNOSIS — C50411 Malignant neoplasm of upper-outer quadrant of right female breast: Secondary | ICD-10-CM

## 2022-08-08 DIAGNOSIS — Z79632 Long term (current) use of antitumor antibiotic: Secondary | ICD-10-CM | POA: Insufficient documentation

## 2022-08-08 DIAGNOSIS — R110 Nausea: Secondary | ICD-10-CM | POA: Diagnosis not present

## 2022-08-08 DIAGNOSIS — R5383 Other fatigue: Secondary | ICD-10-CM | POA: Insufficient documentation

## 2022-08-08 LAB — COMPREHENSIVE METABOLIC PANEL
ALT: 13 U/L (ref 0–44)
AST: 13 U/L — ABNORMAL LOW (ref 15–41)
Albumin: 3.8 g/dL (ref 3.5–5.0)
Alkaline Phosphatase: 80 U/L (ref 38–126)
Anion gap: 7 (ref 5–15)
BUN: 8 mg/dL (ref 6–20)
CO2: 22 mmol/L (ref 22–32)
Calcium: 8.1 mg/dL — ABNORMAL LOW (ref 8.9–10.3)
Chloride: 108 mmol/L (ref 98–111)
Creatinine, Ser: 0.84 mg/dL (ref 0.44–1.00)
GFR, Estimated: >60 mL/min (ref >60)
Glucose, Bld: 101 mg/dL — ABNORMAL HIGH (ref 70–99)
Potassium: 3.9 mmol/L (ref 3.5–5.1)
Sodium: 137 mmol/L (ref 135–145)
Total Bilirubin: 0.6 mg/dL (ref 0.3–1.2)
Total Protein: 7 g/dL (ref 6.5–8.1)

## 2022-08-08 LAB — CBC WITH DIFFERENTIAL/PLATELET
Abs Immature Granulocytes: 0.01 10*3/uL (ref 0.00–0.07)
Basophils Absolute: 0.1 10*3/uL (ref 0.0–0.1)
Basophils Relative: 2 %
Eosinophils Absolute: 0.1 10*3/uL (ref 0.0–0.5)
Eosinophils Relative: 2 %
HCT: 37.9 % (ref 36.0–46.0)
Hemoglobin: 12.7 g/dL (ref 12.0–15.0)
Immature Granulocytes: 0 %
Lymphocytes Relative: 38 %
Lymphs Abs: 1.2 10*3/uL (ref 0.7–4.0)
MCH: 30.5 pg (ref 26.0–34.0)
MCHC: 33.5 g/dL (ref 30.0–36.0)
MCV: 91.1 fL (ref 80.0–100.0)
Monocytes Absolute: 0.2 10*3/uL (ref 0.1–1.0)
Monocytes Relative: 5 %
Neutro Abs: 1.7 10*3/uL (ref 1.7–7.7)
Neutrophils Relative %: 53 %
Platelets: 212 10*3/uL (ref 150–400)
RBC: 4.16 MIL/uL (ref 3.87–5.11)
RDW: 14.3 % (ref 11.5–15.5)
WBC: 3.1 10*3/uL — ABNORMAL LOW (ref 4.0–10.5)
nRBC: 0 % (ref 0.0–0.2)

## 2022-08-08 LAB — MAGNESIUM: Magnesium: 2 mg/dL (ref 1.7–2.4)

## 2022-08-08 MED ORDER — RIBOCICLIB SUCC (600 MG DOSE) 200 MG PO TBPK
600.0000 mg | ORAL_TABLET | Freq: Every day | ORAL | 3 refills | Status: DC
Start: 2022-08-08 — End: 2022-11-14

## 2022-08-08 MED ORDER — SODIUM CHLORIDE 0.9% FLUSH
10.0000 mL | Freq: Once | INTRAVENOUS | Status: AC
  Administered 2022-08-08: 10 mL via INTRAVENOUS

## 2022-08-08 MED ORDER — HEPARIN SOD (PORK) LOCK FLUSH 100 UNIT/ML IV SOLN
500.0000 [IU] | Freq: Once | INTRAVENOUS | Status: AC
  Administered 2022-08-08: 500 [IU] via INTRAVENOUS

## 2022-08-08 NOTE — Progress Notes (Signed)
[facility] [address], [place] [address]   Clinic Day:  08/08/2022  Referring physician: Assunta Found, MD  Patient Care Team: Assunta Found, MD as PCP - General (Family Medicine) Jena Gauss, Gerrit Friends, MD (Gastroenterology) Antony Blackbird, MD as Consulting Physician (Radiation Oncology) Manus Rudd, MD as Consulting Physician (General Surgery) Pershing Proud, RN as Oncology Nurse Navigator Donnelly Angelica, RN as Oncology Nurse Navigator Doreatha Massed, MD as Medical Oncologist (Medical Oncology) Therese Sarah, RN as Oncology Nurse Navigator (Medical Oncology)   ASSESSMENT & PLAN:   Assessment:  1.  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  Noticed skin changes in the right breast 3 months ago. - Mammogram (06/19/2022): Large 8-9 cm mass in the right breast with 2 abnormal axillary lymph nodes and skin thickening. 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  1.8 cm ill-defined enhancing mass in the lateral left breast suspicious for additional malignancy.  Right breast mass involves right chest wall including pectoralis and intercostal muscles.  Prominent subcentimeter left axillary lymph nodes. - PET scan (07/12/2022): Intensely hypermetabolic right breast mass with right axillary lymph nodes.  Hypermetabolic metastatic lymph node in the posterior triangle of the right neck.  Hypermetabolic left axillary lymph nodes.  No evidence of metastatic adenopathy in the mediastinum/abdomen/pelvis.  No evidence of visceral metastasis.  Multiple skeletal metastasis:  Left sacral ala 2.5 cm, left iliac bone, L4 and T12 vertebral bodies, multiple rib lesions, left scapular lesion. - She was evaluated by Dr. Al Pimple and was recommended treatment with ribociclib and anastrozole. - Germline mutation testing: Negative. - Anastrozole and ribociclib started on 07/25/2022  2.  Social/family history: - She lives at home with her husband.  Used OCP for approximately 16-18 years.  Underwent hysterectomy after endometrial ablation when she was found to have fibroids causing pain.  No family history of breast or other malignancies.  Smokes 1 pack of cigarettes per day.   Plan:  1.  Metastatic ER/PR+, HER2-right breast IDC to the bones: - She started ribociclib on 07/25/2022. - In the beginning she had occasional nausea.  Nausea improved after she started taking ribociclib with a light snack. - She is tolerating anastrozole very well. - Reviewed labs today: Creatinine 0.84 and normal LFTs.  CBC shows mild leukopenia without neutropenia, most likely ribociclib effect. - We have done EKG in our office: QTc was 456 ms, slightly increased from EKG prior to the start. - RTC 2 weeks for follow-up with repeat EKG and labs.  If QTc more than 480 ms, will hold ribociclib.  2.  Bone metastasis: - We discussed role of bisphosphonates to decrease SRE. - Zometa first dose was given on 07/27/2022. - Calcium today is low at 8.1. - Recommend taking calcium tablet daily.  She reportedly took weekly vitamin D and ran out of prescription few months ago.  I will check her vitamin D level in 2 weeks.     No orders of the defined types were placed in this encounter.    I,Alexis Herring,acting as a Neurosurgeon for Doreatha Massed, MD.,have documented all relevant documentation on the behalf  of Doreatha Massed, MD,as directed by  Doreatha Massed, MD while in the presence of Doreatha Massed, MD.  I, Doreatha Massed MD, have reviewed the above documentation for accuracy and  completeness, and I agree with the above.    Doreatha Massed, MD   7/3/20246:42 PM  CHIEF COMPLAINT/PURPOSE OF CONSULT:   Diagnosis: metastatic inflammatory right breast cancer   Cancer Staging  Malignant neoplasm of upper-outer quadrant of right breast in female, estrogen receptor positive (HCC) Staging form: Breast, AJCC 8th Edition - Clinical: Stage IV (cT4, cN2, cM1, G2, ER+, PR+, HER2-) - Signed by Rachel Moulds, MD on 07/17/2022    Prior Therapy: none  Current Therapy: Anastrozole and ribociclib   HISTORY OF PRESENT ILLNESS:   Oncology History  Malignant neoplasm of upper-outer quadrant of right breast in female, estrogen receptor positive (HCC)  06/19/2022 Mammogram   Mammogram showed large mass in the right breast which appears to measure 8 to 9 cm on ultrasound, 2 abnormal right axillary lymph node and a third borderline lymph node.  Skin thickening noted along the medial aspect of left breast which may be an extension of the marked skin thickening diffusely on the right.  Otherwise no left breast abnormalities are identified.   06/22/2022 Pathology Results   Needle core biopsy from the right breast upper outer quadrant showed grade 2 IDC, both lymph nodes with metastatic adenocarcinoma.  Prognostic showed ER 90% positive strong staining PR 70% positive strong staining Ki-67 of 20% HER2 negative by IHC 1+   07/03/2022 Initial Diagnosis   Malignant neoplasm of upper-outer quadrant of right breast in female, estrogen receptor positive (HCC)   07/04/2022 Cancer Staging   Staging form: Breast, AJCC 8th Edition - Clinical: Stage IV (cT4, cN2, cM1, G2, ER+, PR+, HER2-) - Signed by Rachel Moulds, MD on 07/17/2022 Stage prefix: Initial diagnosis Histologic grading system: 3 grade system   07/16/2022 -  Chemotherapy   Patient is on Treatment Plan : BREAST ADJUVANT DOSE DENSE AC q14d / PACLitaxel q7d      Genetic Testing   [facility] Custom Panel+RNA was Negative. Report  date is 07/10/2022.  The Custom Hereditary Cancers Panel offered by [facility] includes sequencing and/or deletion duplication testing of the following 43 genes: APC, ATM, AXIN2, BAP1, BARD1, BMPR1A, BRCA1, BRCA2, BRIP1, CDH1, CDK4, CDKN2A (p14ARF and p16INK4a only), CHEK2, CTNNA1, EPCAM (Deletion/duplication testing only), FH, GREM1 (promoter region duplication testing only), HOXB13, KIT, MBD4, MEN1, MLH1, MSH2, MSH3, MSH6, MUTYH, NF1, NHTL1, PALB2, PDGFRA, PMS2, POLD1, POLE, PTEN, RAD51C, RAD51D, SMAD4, SMARCA4. STK11, TP53, TSC1, TSC2, and VHL.       Monica Soto is a 57 y.o. female presenting to clinic today for evaluation of metastatic breast cancer at the request of Dr. Al Pimple for a second opinion.  She presented to her PCP with a palpable right breast mass. Diagnostic mammogram and bilateral US on 06/19/22 showed: 8-9 cm mass in right breast; two abnormal right axillary lymph nodes, and a third borderline lymph node; diffuse skin thickening over right breast and along medial aspect of left breast; otherwise, no left breast abnormalities.  Biopsies on 06/22/22 revealed invasive ductal adenocarcinoma, grade 2, to the right breast mass and two lymph nodes, ER 90%+, PR 70%+, Her2- (1+), Ki67 20%.  Staging PET scan on 07/12/22 revealed: hypermetabolism to right breast mass, bilateral axillary lymph nodes, a right neck lymph node in posterior triangle, and multiple skeletal lesions; no evidence of metastatic adenopathy in mediastinum or abdomen/pelvis or evidence of visceral metastasis.  She also  underwent breast MRI on 07/13/22 showing: extensive inflammatory breast carcinoma with malignancy involving most of right breast with marked skin thickening extending to medial left breast and enhancement extending posteriorly to broadly and deeply involve right chest wall, including pectoralis, underlying intercostal muscles, and a small area extending to underlying pleura, and medially to right internal mammary chain; 1.8 cm  mass in lateral left breast; prominent but subcentimeter left axillary lymph nodes.  Of note, she had port placed on 07/16/22 and negative genetic testing on 07/04/22.  She discussed treatment options for her now metastatic breast cancer with Dr. Al Pimple on 07/17/22.  Today, she states that she is doing well overall. Her appetite level is at 100%. Her energy level is at 80%.  INTERVAL HISTORY:   She was last seen by me on 07/25/22 for consult.  Today, she states that she is doing okay overall. Her appetite level is at 100%. Her energy level is at 60-70%. She is accompanied by her husband.   She complains of right right arm swelling, and can go 2-3 days at a time without swelling.  She started ribociclib since our last visit and c/o of mild nausea when she first started. She now takes Zofran and  a light snack to improve nausea. She denies any toxicities with anastrozole.   Patient complains of severe fatigue 1-2 days a week but otherwise has normal energy levels. She denies any palpations.   She complains of one episode of diarrhea that occurred last night. Patient states she took Imodium and has had no issues since.    PAST MEDICAL HISTORY:   Past Medical History: Past Medical History:  Diagnosis Date   Depression    Erosive esophagitis 04/26/2010   EGD by Dr. Jena Gauss, small hiatal hernia   GERD (gastroesophageal reflux disease)    History of stomach ulcers    IBS (irritable bowel syndrome)    Diarrhea predominant   Microscopic colitis 04/26/2010   Colonoscopy by Dr. Jena Gauss, good response with Entocort   Tubular adenoma of colon 04/26/2010   Junction of descending and sigmoid 40 CM from anus    Surgical History: Past Surgical History:  Procedure Laterality Date   ABDOMINAL HYSTERECTOMY     APPENDECTOMY  2/11   Dr. Malvin Johns with a delayed closure   BREAST BIOPSY Right 06/22/2022   [place] RT BREAST BX W LOC DEV 1ST LESION IMG BX SPEC US GUIDE 06/22/2022 [facility]MO[facility]   PORTACATH  PLACEMENT N/A 07/16/2022   Procedure: INSERTION PORT-A-CATH;  Surgeon: Manus Rudd, MD;  Location: [facility];  Service: General;  Laterality: N/A;   TONSILLECTOMY     TUBAL LIGATION      Social History: Social History   Socioeconomic History   Marital status: Married    Spouse name: Not on file   Number of children: Not on file   Years of education: Not on file   Highest education level: Not on file  Occupational History    Employer: [employer]    Comment: Third shift  Tobacco Use   Smoking status: Every Day    Packs/day: .5    Types: Cigarettes   Smokeless tobacco: Never  Vaping Use   Vaping Use: Never used  Substance and Sexual Activity   Alcohol use: Yes    Alcohol/week: 1.0 standard drink of alcohol    Types: 1 Glasses of wine per week    Comment: seldom   Drug use: No   Sexual activity: Yes  Birth control/protection: Surgical  Other Topics Concern   Not on file  Social History Narrative   Not on file   Social Determinants of Health   Financial Resource Strain: Not on file  Food Insecurity: No Food Insecurity (07/04/2022)   Hunger Vital Sign    Worried About Running Out of Food in the Last Year: Never true    Ran Out of Food in the Last Year: Never true  Transportation Needs: No Transportation Needs (07/04/2022)   PRAPARE - Administrator, Civil Service (Medical): No    Lack of Transportation (Non-Medical): No  Physical Activity: Not on file  Stress: Not on file  Social Connections: Not on file  Intimate Partner Violence: Not on file    Family History: Family History  Problem Relation Age of Onset   Diabetes Mother    Coronary artery disease Mother    Healthy Father     Current Medications:  Current Outpatient Medications:    anastrozole (ARIMIDEX) 1 MG tablet, Take 1 tablet (1 mg total) by mouth daily., Disp: 90 tablet, Rfl: 3   Aspirin-Acetaminophen-Caffeine (GOODY HEADACHE PO), Take 1 packet by mouth 2 (two)  times daily as needed (for pain)., Disp: , Rfl:    Cholecalciferol 1.25 MG (50000 UT) capsule, Take 1 capsule by mouth once a week., Disp: , Rfl:    dexamethasone (DECADRON) 4 MG tablet, Take 2 tablets (8 mg total) by mouth daily for 3 days. Start the day after doxorubicin/cyclophosphamide chemotherapy. Take with food., Disp: 30 tablet, Rfl: 1   HYDROcodone-acetaminophen (NORCO/VICODIN) 5-325 MG tablet, Take 1 tablet by mouth every 6 (six) hours as needed for moderate pain., Disp: 15 tablet, Rfl: 0   lidocaine-prilocaine (EMLA) cream, Apply to affected area once, Disp: 30 g, Rfl: 3   ondansetron (ZOFRAN) 8 MG tablet, Take 1 tab (8 mg) by mouth every 8 hrs as needed for nausea/vomiting. Start third day after doxorubicin/cyclophosphamide chemotherapy., Disp: 30 tablet, Rfl: 1   prochlorperazine (COMPAZINE) 10 MG tablet, Take 1 tablet (10 mg total) by mouth every 6 (six) hours as needed for nausea or vomiting., Disp: 30 tablet, Rfl: 1   ribociclib succ (KISQALI 600MG  DAILY DOSE) 200 MG Therapy Pack, Take 3 tablets (600 mg total) by mouth daily. Take for 21 days on, 7 days off, repeat every 28 days., Disp: 63 tablet, Rfl: 3   Allergies: No Known Allergies  REVIEW OF SYSTEMS:   Review of Systems  Constitutional:  Positive for fatigue. Negative for chills and fever.  HENT:   Negative for lump/mass, mouth sores, nosebleeds, sore throat and trouble swallowing.   Eyes:  Negative for eye problems.  Respiratory:  Negative for cough and shortness of breath.   Cardiovascular:  Negative for chest pain, leg swelling and palpitations.  Gastrointestinal:  Positive for diarrhea and nausea. Negative for abdominal pain, constipation and vomiting.  Genitourinary:  Negative for bladder incontinence, difficulty urinating, dysuria, frequency, hematuria and nocturia.   Musculoskeletal:  Positive for arthralgias (left shoulder pain, 6/10 severity). Negative for back pain, flank pain, myalgias and neck pain.  Skin:   Negative for itching and rash.  Neurological:  Negative for dizziness, headaches and numbness.  Hematological:  Does not bruise/bleed easily.  Psychiatric/Behavioral:  Negative for depression, sleep disturbance and suicidal ideas. The patient is not nervous/anxious.   All other systems reviewed and are negative.    VITALS:   There were no vitals taken for this visit.  Wt Readings from Last 3 Encounters:  08/08/22 167 lb 3.2 oz (75.8 kg)  07/25/22 167 lb 6.4 oz (75.9 kg)  07/17/22 169 lb 12.8 oz (77 kg)    There is no height or weight on file to calculate BMI.  Performance status (ECOG): 1 - Symptomatic but completely ambulatory  PHYSICAL EXAM:   Physical Exam Vitals and nursing note reviewed. Exam conducted with a chaperone present.  Constitutional:      Appearance: Normal appearance.  Cardiovascular:     Rate and Rhythm: Normal rate and regular rhythm.     Pulses: Normal pulses.     Heart sounds: Normal heart sounds.  Pulmonary:     Effort: Pulmonary effort is normal.     Breath sounds: Normal breath sounds.  Abdominal:     Palpations: Abdomen is soft. There is no hepatomegaly, splenomegaly or mass.     Tenderness: There is no abdominal tenderness.  Musculoskeletal:     Right lower leg: No edema.     Left lower leg: No edema.  Lymphadenopathy:     Cervical: No cervical adenopathy.     Right cervical: No superficial, deep or posterior cervical adenopathy.    Left cervical: No superficial, deep or posterior cervical adenopathy.     Upper Body:     Right upper body: No supraclavicular or axillary adenopathy.     Left upper body: No supraclavicular or axillary adenopathy.  Neurological:     General: No focal deficit present.     Mental Status: She is alert and oriented to person, place, and time.  Psychiatric:        Mood and Affect: Mood normal.        Behavior: Behavior normal.     LABS:      Latest Ref Rng & Units 08/08/2022   12:30 PM 07/17/2022    9:45 AM  07/04/2022    8:04 AM  CBC  WBC 4.0 - 10.5 K/uL 3.1  10.5  6.4   Hemoglobin 12.0 - 15.0 g/dL 16.1  09.6  04.5   Hematocrit 36.0 - 46.0 % 37.9  39.3  42.8   Platelets 150 - 400 K/uL 212  251  242       Latest Ref Rng & Units 08/08/2022   12:30 PM 07/17/2022    9:45 AM 07/04/2022    8:04 AM  CMP  Glucose 70 - 99 mg/dL 409  811  914   BUN 6 - 20 mg/dL 8  15  14    Creatinine 0.44 - 1.00 mg/dL 7.82  9.56  2.13   Sodium 135 - 145 mmol/L 137  140  140   Potassium 3.5 - 5.1 mmol/L 3.9  3.8  3.9   Chloride 98 - 111 mmol/L 108  106  106   CO2 22 - 32 mmol/L 22  27  28    Calcium 8.9 - 10.3 mg/dL 8.1  08.6  9.9   Total Protein 6.5 - 8.1 g/dL 7.0  7.1  7.2   Total Bilirubin 0.3 - 1.2 mg/dL 0.6  0.3  0.4   Alkaline Phos 38 - 126 U/L 80  73  81   AST 15 - 41 U/L 13  11  17    ALT 0 - 44 U/L 13  14  17       No results found for: "CEA1", "CEA" / No results found for: "CEA1", "CEA" No results found for: "PSA1" No results found for: "VHQ469" No results found for: "CAN125"  No results found for: "TOTALPROTELP", "ALBUMINELP", "A1GS", "A2GS", "  BETS", "BETA2SER", "GAMS", "MSPIKE", "SPEI" No results found for: "TIBC", "FERRITIN", "IRONPCTSAT" No results found for: "LDH"   STUDIES:   NM PET Image Initial (PI) Skull Base To Thigh  Result Date: 07/16/2022 CLINICAL DATA:  Subsequent treatment strategy for invasive breast carcinoma.) Breast cancer. EXAM: NUCLEAR MEDICINE PET SKULL BASE TO THIGH TECHNIQUE: 8.4 mCi F-18 FDG was injected intravenously. Full-ring PET imaging was performed from the skull base to thigh after the radiotracer. CT data was obtained and used for attenuation correction and anatomic localization. Fasting blood glucose: 103 mg/dl COMPARISON:  None Available. FINDINGS: Mediastinal blood pool activity: SUV max Liver activity: SUV max NA NECK: No hypermetabolic lymph nodes in the neck. Incidental CT findings: None. CHEST: Intensely hypermetabolic mass in the RIGHT breast measures 6.0 cm  with SUV max equal 9.2. There is hypermetabolic skin thickening in the RIGHT breast superficial to mass. Cluster of hypermetabolic RIGHT axillary lymph nodes surround a biopsy clip with SUV max equal 10.75. Metastatic lymph node in the posterior triangle of the RIGHT neck with SUV max equal 4.0 on image 68. Hypermetabolic LEFT axillary lymph nodes are small but intense with SUV max equal 3.8. No hypermetabolic mediastinal lymph nodes. No hypermetabolic internal mammary nodes. No suspicious pulmonary nodules. Incidental CT findings: None. ABDOMEN/PELVIS: No abnormal metabolic activity liver. Adrenal glands normal. No hypermetabolic lymph nodes in the abdomen pelvis. Post hysterectomy.  Adnexa unremarkable Incidental CT findings: None. SKELETON: Multiple foci of intensely radiotracer avid skeletal metastasis. These metastatic lesions are predominantly occult by CT imaging. For example broad lesion in the LEFT sacral ala measures approximately 2.5 cm SUV max equal 10.2. No CT change. Lesion in the posterior LEFT iliac bone with SUV max equal 7.1 on image 201 no CT change. Metastatic lesions noted in the L4 and T12 vertebral bodies. Multiple rib lesions are present. LEFT scapular lesion with SUV max equal 4.5 on image 96. Incidental CT findings: None. IMPRESSION: 1. Intensely hypermetabolic RIGHT breast mass consistent with primary breast carcinoma. 2. Hypermetabolic metastatic RIGHT axillary lymph nodes. 3. Hypermetabolic metastatic lymph node in the posterior triangle of the RIGHT neck. 4. Hypermetabolic LEFT axillary lymph nodes. 5. No evidence of metastatic adenopathy in the mediastinum or abdomen pelvis. 6. No evidence of visceral metastasis. 7. Multiple intensely hypermetabolic skeletal metastasis. Electronically Signed   By: Genevive Bi M.D.   On: 07/16/2022 15:45   MR BREAST BILATERAL W WO CONTRAST INC CAD  Addendum Date: 07/16/2022   ADDENDUM REPORT: 07/16/2022 12:31 ADDENDUM: Laterality error in the  body the original report. In the right breast section, the large irregular mass is in the central right breast, not the left. Electronically Signed   By: Amie Portland M.D.   On: 07/16/2022 12:31   Result Date: 07/16/2022 CLINICAL DATA:  Recently diagnosed invasive right breast carcinoma. A large right breast mass was biopsied as well as a lower right axilla mass and an abnormal right axillary lymph nodes, all positive for malignancy. Patient has clinical findings of inflammatory breast carcinoma with skin thickening that extends to the medial left breast. Current exam is for staging pre neoadjuvant chemotherapy assessment. EXAM: BILATERAL BREAST MRI WITH AND WITHOUT CONTRAST TECHNIQUE: Multiplanar, multisequence MR images of both breasts were obtained prior to and following the intravenous administration of 7 ml of Gadavist Three-dimensional MR images were rendered by post-processing of the original MR data on an independent workstation. The three-dimensional MR images were interpreted, and findings are reported in the following complete MRI report for this study.  Three dimensional images were evaluated at the independent interpreting workstation using the DynaCAD thin client. COMPARISON:  Prior exams. FINDINGS: Breast composition: c. Heterogeneous fibroglandular tissue. Background parenchymal enhancement: Mild Right breast: There is extensive abnormal enhancement throughout most of the right breast, as well as skin thickening up to 1 cm and diffuse skin enhancement. The largest portion of the irregular mass, centered in the central left breast, but extending to all 4 quadrants, measures approximately 4.2 x 6.8 x 6.7 cm (AP x TRV x SI). Contiguous with this mass, in the upper outer quadrant, is an additional irregular mass measuring 2.8 x 2.4 x 3.0 cm (AP x TRV x SI). In the axillary tail region, a third ill-defined mass is noted measuring 3.3 x 3.2 x 4.1 cm (AP x TRV x SI). In addition to these masses, multiple  smaller ill-defined masses are noted as well as enhancing thickened trabecula throughout the right breast. Abnormal enhancement extends to involve much of the right pectoralis muscle, extending posterior to the pectoralis into the deep chest wall with a focus of enhancement noted extending to the underlying anterior right upper lobe pleura. There is contiguous enhancement along the right internal mammary chain, which may reflect an abnormal node. Susceptibility artifact from the venous shaped post biopsy marker clip lies in the upper outer right breast. Artifact from the coil shaped clip in the right axilla mass is also noted, but less apparent. Left breast: There is an ill-defined masslike area of enhancement in the lateral left breast near 3 o'clock, middle to posterior depth, measuring 1.8 x 1.1 x 1.4 cm (AP x TRV x SI). No other left breast masses. Abnormal skin thickening and enhancement is noted along the medial aspect of the left breast, with underlying trabecular thickening and enhancement. Lymph nodes: No defined abnormal right axillary lymph nodes. There are enhancing prominent left axillary lymph nodes measuring up to 9 mm in short axis. On review of the current PET CT, these demonstrate hypermetabolism Ancillary findings:  None. IMPRESSION: 1. Extensive inflammatory breast carcinoma on the right with malignancy involving most of the right breast with marked skin thickening and enhancement. Abnormal enhancement extends posteriorly to broadly and deeply involve the right chest wall including the pectoralis and underlying intercostal muscles with a small area of abnormal enhancement extending to the underlying pleura. Enhancement extends medially to the right internal mammary chain. 2. 1.8 cm ill-defined enhancing mass in the lateral left breast suspicious for additional malignancy. 3. Left medial breast skin thickening and underlying trabecular thickening with associated abnormal enhancement suspicious for  contralateral extension of inflammatory breast carcinoma. 4. Prominent, but subcentimeter, left axillary lymph nodes are associated with hypermetabolism on PET. These suspicious for metastatic disease. RECOMMENDATION: 1. MRI guided core needle biopsy of the 1.8 cm enhancing left breast mass, image 60, series 11. BI-RADS CATEGORY  4: Suspicious. Electronically Signed: By: Amie Portland M.D. On: 07/16/2022 10:59  DG CHEST PORT 1 VIEW  Result Date: 07/16/2022 CLINICAL DATA:  [id number] Encounter for central line placement [id number] EXAM: PORTABLE CHEST - 1 VIEW COMPARISON:  PET-CT 07/12/2022 FINDINGS: Right IJ port catheter to the proximal right atrium. No pneumothorax. Relatively low lung volumes with mild prominence of interstitial markings right greater than left. No convincing confluent airspace disease. Heart size and mediastinal contours are within normal limits. No effusion. Visualized bones unremarkable. IMPRESSION: Right IJ port catheter to the proximal right atrium. No pneumothorax. Electronically Signed   By: Corlis Leak M.D.   On: 07/16/2022 10:49  DG C-Arm 1-60 Min-No Report  Result Date: 07/16/2022 Fluoroscopy was utilized by the requesting physician.  No radiographic interpretation.

## 2022-08-08 NOTE — Patient Instructions (Signed)
[facility]  Discharge Instructions  You were seen and examined today by Dr. Ellin Saba.  Continue Kisqali as prescribed. Your labs are stable.  Follow-up as scheduled.  Thank you for choosing [facility] to provide your oncology and hematology care.   To afford each patient quality time with our provider, please arrive at least 15 minutes before your scheduled appointment time. You may need to reschedule your appointment if you arrive late (10 or more minutes). Arriving late affects you and other patients whose appointments are after yours.  Also, if you miss three or more appointments without notifying the office, you may be dismissed from the clinic at the provider's discretion.    Again, thank you for choosing [facility].  Our hope is that these requests will decrease the amount of time that you wait before being seen by our physicians.   If you have a lab appointment with the Cancer Center - please note that after April 8th, all labs will be drawn in the cancer center.  You do not have to check in or register with the main entrance as you have in the past but will complete your check-in at the cancer center.            _____________________________________________________________  Should you have questions after your visit to [facility], please contact our office at [phone] and follow the prompts.  Our office hours are 8:00 a.m. to 4:30 p.m. Monday - Thursday and 8:00 a.m. to 2:30 p.m. Friday.  Please note that voicemails left after 4:00 p.m. may not be returned until the following business day.  We are closed weekends and all major holidays.  You do have access to a nurse 24-7, just call the main number to the clinic [phone] and do not press any options, hold on the line and a nurse will answer the phone.    For prescription refill requests, have your pharmacy contact our office and allow 72  hours.    Masks are no longer required in the cancer centers. If you would like for your care team to wear a mask while they are taking care of you, please let them know. You may have one support person who is at least 56 years old accompany you for your appointments.

## 2022-08-08 NOTE — Patient Instructions (Signed)
[facility]  Discharge Instructions: Thank you for choosing [facility] to provide your oncology and hematology care.  If you have a lab appointment with the Cancer Center - please note that after April 8th, 2024, all labs will be drawn in the cancer center.  You do not have to check in or register with the main entrance as you have in the past but will complete your check-in in the cancer center.  Wear comfortable clothing and clothing appropriate for easy access to any Portacath or PICC line.   We strive to give you quality time with your provider. You may need to reschedule your appointment if you arrive late (15 or more minutes).  Arriving late affects you and other patients whose appointments are after yours.  Also, if you miss three or more appointments without notifying the office, you may be dismissed from the clinic at the provider's discretion.      For prescription refill requests, have your pharmacy contact our office and allow 72 hours for refills to be completed.  To help prevent nausea and vomiting after your treatment, we encourage you to take your nausea medication as directed.  BELOW ARE SYMPTOMS THAT SHOULD BE REPORTED IMMEDIATELY: *FEVER GREATER THAN 100.4 F (38 C) OR HIGHER *CHILLS OR SWEATING *NAUSEA AND VOMITING THAT IS NOT CONTROLLED WITH YOUR NAUSEA MEDICATION *UNUSUAL SHORTNESS OF BREATH *UNUSUAL BRUISING OR BLEEDING *URINARY PROBLEMS (pain or burning when urinating, or frequent urination) *BOWEL PROBLEMS (unusual diarrhea, constipation, pain near the anus) TENDERNESS IN MOUTH AND THROAT WITH OR WITHOUT PRESENCE OF ULCERS (sore throat, sores in mouth, or a toothache) UNUSUAL RASH, SWELLING OR PAIN  UNUSUAL VAGINAL DISCHARGE OR ITCHING   Items with * indicate a potential emergency and should be followed up as soon as possible or go to the Emergency Department if any problems should occur.  Please show the CHEMOTHERAPY ALERT CARD or  IMMUNOTHERAPY ALERT CARD at check-in to the Emergency Department and triage nurse.  Should you have questions after your visit or need to cancel or reschedule your appointment, please contact [facility] [phone]  and follow the prompts.  Office hours are 8:00 a.m. to 4:30 p.m. Monday - Friday. Please note that voicemails left after 4:00 p.m. may not be returned until the following business day.  We are closed weekends and major holidays. You have access to a nurse at all times for urgent questions. Please call the main number to the clinic [phone] and follow the prompts.  For any non-urgent questions, you may also contact your provider using MyChart. We now offer e-Visits for anyone 18 and older to request care online for non-urgent symptoms. For details visit [email or web address].   Also download the MyChart app! Go to the app store, search "MyChart", open the app, select [place], and log in with your MyChart username and password.   

## 2022-08-09 LAB — CANCER ANTIGEN 27.29: CA 27.29: 337.7 U/mL — ABNORMAL HIGH (ref 0.0–38.6)

## 2022-08-10 LAB — CANCER ANTIGEN 15-3: CA 15-3: 201 U/mL — ABNORMAL HIGH (ref 0.0–25.0)

## 2022-08-13 ENCOUNTER — Other Ambulatory Visit: Payer: Self-pay

## 2022-08-14 ENCOUNTER — Ambulatory Visit: Payer: 59 | Admitting: Hematology

## 2022-08-14 ENCOUNTER — Other Ambulatory Visit: Payer: 59

## 2022-08-14 ENCOUNTER — Ambulatory Visit: Payer: 59

## 2022-08-15 ENCOUNTER — Ambulatory Visit: Payer: 59 | Admitting: Hematology and Oncology

## 2022-08-21 NOTE — Progress Notes (Signed)
[facility] [address] [place] [address]   Clinic Day:  08/22/22   Referring physician: Assunta Found, MD  Patient Care Team: Monica Found, MD as PCP - General (Family Medicine) Monica Soto, Monica Friends, MD (Gastroenterology) Monica Blackbird, MD as Consulting Physician (Radiation Oncology) Monica Rudd, MD as Consulting Physician (General Surgery) Monica Proud, RN as Oncology Nurse Navigator Monica Angelica, RN as Oncology Nurse Navigator Monica Massed, MD as Medical Oncologist (Medical Oncology) Monica Sarah, RN as Oncology Nurse Navigator (Medical Oncology)   ASSESSMENT & PLAN:   Assessment:  1.  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  Noticed skin changes in the right breast 3 months ago. - Mammogram (06/19/2022): Large 8-9 cm mass in the right breast with 2 abnormal axillary lymph nodes and skin thickening. 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  1.8 cm ill-defined enhancing mass in the lateral left breast suspicious for additional malignancy.  Right breast mass involves right chest wall including pectoralis and intercostal muscles.  Prominent subcentimeter left axillary lymph nodes. - PET scan (07/12/2022): Intensely hypermetabolic right breast mass with right axillary lymph nodes.  Hypermetabolic metastatic lymph node in the posterior triangle of the right neck.  Hypermetabolic left axillary lymph nodes.  No evidence of metastatic adenopathy in the mediastinum/abdomen/pelvis.  No evidence of visceral metastasis.  Multiple skeletal metastasis:  Left sacral ala 2.5 cm, left iliac bone, L4 and T12 vertebral bodies, multiple rib lesions, left scapular lesion. - She was evaluated by Dr. Al Soto and was recommended treatment with ribociclib and anastrozole. - Germline mutation testing: Negative. - Anastrozole and ribociclib started on 07/25/2022  2.  Social/family history: - She lives at home with her husband.  Used OCP for approximately 16-18 years.  Underwent hysterectomy after endometrial ablation when she was Soto to have fibroids causing pain.  No family history of breast or other malignancies.  Smokes 1 pack of cigarettes per day.   Plan:  1.  Metastatic ER/PR+, HER2-right breast IDC to the bones: - She started ribociclib on 07/25/2022.  Last 7 days, she has been off of ribociclib. - At the beginning she had nausea which was controlled by taking medication with a light snack. - She is tolerating anastrozole very well. - She reported drainage area at the right breast biopsy site, smelly and yellowish discharge for the past 3 days. - I will give her Keflex 500 mg 3 times daily for 7 days. - Reviewed labs today: Normal LFTs and creatinine.  CBC grossly normal.  CA 15-3 was 201 and CA 27-29 was 337. - We did EKG in our office today which showed QTc 449 ms.  No significant changes from EKG 2 weeks ago. - She is asymptomatic with no palpitations. - She will start ribociclib cycle 2 today without any dose modifications. - I will do EKG only if she develops symptoms in the future. - RTC 4 weeks for follow-up with repeat labs.  2.  Bone metastasis: - Zometa first dose was given on 07/27/2022. - Calcium today is 8.8.  No jaw pain reported.  Continue calcium and vitamin D  supplements.     No orders of the defined types were placed in this encounter.     Monica Soto,acting as a Neurosurgeon for Monica Massed, MD.,have documented all relevant documentation on the behalf of Monica Massed, MD,as directed by  Monica Massed, MD while in the presence of Monica Massed, MD.  I, Monica Massed MD, have reviewed the above documentation for accuracy and completeness, and I agree with the above.     Monica Soto   7/16/20248:43 PM  CHIEF COMPLAINT/PURPOSE OF CONSULT:   Diagnosis: metastatic inflammatory right breast cancer   Cancer Staging  Malignant neoplasm of upper-outer quadrant of right breast in female, estrogen receptor positive (HCC) Staging form: Breast, AJCC 8th Edition - Clinical: Stage IV (cT4, cN2, cM1, G2, ER+, PR+, HER2-) - Signed by Monica Moulds, MD on 07/17/2022    Prior Therapy: none  Current Therapy: Anastrozole and ribociclib   HISTORY OF PRESENT ILLNESS:   Oncology History  Malignant neoplasm of upper-outer quadrant of right breast in female, estrogen receptor positive (HCC)  06/19/2022 Mammogram   Mammogram showed large mass in the right breast which appears to measure 8 to 9 cm on ultrasound, 2 abnormal right axillary lymph node and a third borderline lymph node.  Skin thickening noted along the medial aspect of left breast which may be an extension of the marked skin thickening diffusely on the right.  Otherwise no left breast abnormalities are identified.   06/22/2022 Pathology Results   Needle core biopsy from the right breast upper outer quadrant showed grade 2 IDC, both lymph nodes with metastatic adenocarcinoma.  Prognostic showed ER 90% positive strong staining PR 70% positive strong staining Ki-67 of 20% HER2 negative by IHC 1+   07/03/2022 Initial Diagnosis   Malignant neoplasm of upper-outer quadrant of right breast in female, estrogen receptor positive (HCC)   07/04/2022 Cancer Staging   Staging form: Breast, AJCC 8th Edition - Clinical: Stage IV (cT4, cN2, cM1, G2, ER+, PR+, HER2-) - Signed by Monica Moulds, MD on 07/17/2022 Stage prefix: Initial diagnosis Histologic grading system: 3 grade system   07/16/2022 - 07/16/2022 Chemotherapy    Patient is on Treatment Plan : BREAST ADJUVANT DOSE DENSE AC q14d / PACLitaxel q7d      Genetic Testing   [facility] Custom Panel+RNA was Negative. Report date is 07/10/2022.  The Custom Hereditary Cancers Panel offered by [facility] includes sequencing and/or deletion duplication testing of the following 43 genes: APC, ATM, AXIN2, BAP1, BARD1, BMPR1A, BRCA1, BRCA2, BRIP1, CDH1, CDK4, CDKN2A (p14ARF and p16INK4a only), CHEK2, CTNNA1, EPCAM (Deletion/duplication testing only), FH, GREM1 (promoter region duplication testing only), HOXB13, KIT, MBD4, MEN1, MLH1, MSH2, MSH3, MSH6, MUTYH, NF1, NHTL1, PALB2, PDGFRA, PMS2, POLD1, POLE, PTEN, RAD51C, RAD51D, SMAD4, SMARCA4. STK11, TP53, TSC1, TSC2, and VHL.       Monica Soto is a 57 y.o. female presenting to clinic today for evaluation of metastatic breast cancer at the request of Dr. Al Soto for a second opinion.  She presented to her PCP with a palpable right breast mass. Diagnostic mammogram and bilateral US on 06/19/22 showed: 8-9 cm mass in right breast; two abnormal right axillary lymph nodes, and a third borderline lymph node; diffuse skin thickening over right breast and along medial aspect of left breast; otherwise, no left breast abnormalities.  Biopsies on 06/22/22 revealed invasive ductal adenocarcinoma, grade 2, to the right breast mass and two lymph nodes, ER 90%+, PR 70%+, Her2- (1+), Ki67 20%.  Staging PET scan on 07/12/22 revealed:  hypermetabolism to right breast mass, bilateral axillary lymph nodes, a right neck lymph node in posterior triangle, and multiple skeletal lesions; no evidence of metastatic adenopathy in mediastinum or abdomen/pelvis or evidence of visceral metastasis.  She also underwent breast MRI on 07/13/22 showing: extensive inflammatory breast carcinoma with malignancy involving most of right breast with marked skin thickening extending to medial left breast and enhancement extending posteriorly to broadly and deeply involve right chest wall,  including pectoralis, underlying intercostal muscles, and a small area extending to underlying pleura, and medially to right internal mammary chain; 1.8 cm mass in lateral left breast; prominent but subcentimeter left axillary lymph nodes.  Of note, she had port placed on 07/16/22 and negative genetic testing on 07/04/22.  She discussed treatment options for her now metastatic breast cancer with Dr. Al Soto on 07/17/22.  Today, she states that she is doing well overall. Her appetite level is at 100%. Her energy level is at 80%.  INTERVAL HISTORY:   Monica Soto is a 57 y.o. female presenting to the clinic today for follow-up of metastatic inflammatory right breast cancer. She was last seen by me on 08/08/22.  Today, she states that she is doing okay overall. Her appetite level is at 100%. Her energy level is at 70%. She is accompanied by her husband.  During this visit she had an EKG that was stable.   She c/o occasional nausea. She has been off of Kisqali for the last 7 days. She is taking anastrozole without any issues. She has not started taking calcium. She denies any jaw pain or other issues with medications.   She c/o soreness on the right breast that is painful and has had minimal, yellow discharge with a smell on the site of her biopsy that has worsened over the last 3 days and started 1 week ago. She has been doing saline rinses of the area 3x a day.   PAST MEDICAL HISTORY:   Past Medical History: Past Medical History:  Diagnosis Date   Depression    Erosive esophagitis 04/26/2010   EGD by Dr. Jena Soto, small hiatal hernia   GERD (gastroesophageal reflux disease)    History of stomach ulcers    IBS (irritable bowel syndrome)    Diarrhea predominant   Microscopic colitis 04/26/2010   Colonoscopy by Dr. Jena Soto, good response with Entocort   Tubular adenoma of colon 04/26/2010   Junction of descending and sigmoid 40 CM from anus    Surgical History: Past Surgical History:   Procedure Laterality Date   ABDOMINAL HYSTERECTOMY     APPENDECTOMY  2/11   Dr. Malvin Johns with a delayed closure   BREAST BIOPSY Right 06/22/2022   [place] RT BREAST BX W LOC DEV 1ST LESION IMG BX SPEC US GUIDE 06/22/2022 [facility] MAMMOGRAP[facility]   PORTACATH PLACEMENT N/A 07/16/2022   Procedure: INSERTION PORT-A-CATH;  Surgeon: Monica Rudd, MD;  Location: [facility];  Service: General;  Laterality: N/A;   TONSILLECTOMY     TUBAL LIGATION      Social History: Social History   Socioeconomic History   Marital status: Married    Spouse name: Not on file   Number of children: Not on file   Years of education: Not on file   Highest education level: Not on file  Occupational History    Employer: [employer]    Comment: Third shift  Tobacco Use   Smoking status: Every Day    Current packs/day: 0.50    Types:  Cigarettes   Smokeless tobacco: Never  Vaping Use   Vaping status: Never Used  Substance and Sexual Activity   Alcohol use: Yes    Alcohol/week: 1.0 standard drink of alcohol    Types: 1 Glasses of wine per week    Comment: seldom   Drug use: No   Sexual activity: Yes    Birth control/protection: Surgical  Other Topics Concern   Not on file  Social History Narrative   Not on file   Social Determinants of Health   Financial Resource Strain: Not on file  Food Insecurity: No Food Insecurity (07/04/2022)   Hunger Vital Sign    Worried About Running Out of Food in the Last Year: Never true    Ran Out of Food in the Last Year: Never true  Transportation Needs: No Transportation Needs (07/04/2022)   PRAPARE - Administrator, Civil Service (Medical): No    Lack of Transportation (Non-Medical): No  Physical Activity: Not on file  Stress: Not on file  Social Connections: Not on file  Intimate Partner Violence: Not on file    Family History: Family History  Problem Relation Age of Onset   Diabetes Mother    Coronary artery disease Mother    Healthy  Father     Current Medications:  Current Outpatient Medications:    anastrozole (ARIMIDEX) 1 MG tablet, Take 1 tablet (1 mg total) by mouth daily., Disp: 90 tablet, Rfl: 3   Aspirin-Acetaminophen-Caffeine (GOODY HEADACHE PO), Take 1 packet by mouth 2 (two) times daily as needed (for pain)., Disp: , Rfl:    Cholecalciferol 1.25 MG (50000 UT) capsule, Take 1 capsule by mouth once a week., Disp: , Rfl:    HYDROcodone-acetaminophen (NORCO/VICODIN) 5-325 MG tablet, Take 1 tablet by mouth every 6 (six) hours as needed for moderate pain., Disp: 15 tablet, Rfl: 0   lidocaine-prilocaine (EMLA) cream, Apply to affected area once, Disp: 30 g, Rfl: 3   ondansetron (ZOFRAN) 8 MG tablet, Take 1 tab (8 mg) by mouth every 8 hrs as needed for nausea/vomiting. Start third day after doxorubicin/cyclophosphamide chemotherapy., Disp: 30 tablet, Rfl: 1   prochlorperazine (COMPAZINE) 10 MG tablet, Take 1 tablet (10 mg total) by mouth every 6 (six) hours as needed for nausea or vomiting., Disp: 30 tablet, Rfl: 1   ribociclib succ (KISQALI 600MG  DAILY DOSE) 200 MG Therapy Pack, Take 3 tablets (600 mg total) by mouth daily. Take for 21 days on, 7 days off, repeat every 28 days., Disp: 63 tablet, Rfl: 3   Allergies: No Known Allergies  REVIEW OF SYSTEMS:   Review of Systems  Constitutional:  Negative for chills, fatigue and fever.       +pain in right breast +swelling in right breast  HENT:   Negative for lump/mass, mouth sores, nosebleeds, sore throat and trouble swallowing.   Eyes:  Negative for eye problems.  Respiratory:  Negative for cough and shortness of breath.   Cardiovascular:  Negative for chest pain, leg swelling and palpitations.  Gastrointestinal:  Positive for nausea. Negative for abdominal pain, constipation, diarrhea and vomiting.  Genitourinary:  Negative for bladder incontinence, difficulty urinating, dysuria, frequency, hematuria and nocturia.   Musculoskeletal:  Negative for arthralgias, back  pain, flank pain, myalgias and neck pain.  Skin:  Negative for itching and rash.  Neurological:  Positive for numbness (in right hand). Negative for dizziness and headaches.  Hematological:  Does not bruise/bleed easily.  Psychiatric/Behavioral:  Negative for depression, sleep disturbance and  suicidal ideas. The patient is not nervous/anxious.   All other systems reviewed and are negative.    VITALS:   There were no vitals taken for this visit.  Wt Readings from Last 3 Encounters:  08/08/22 167 lb 3.2 oz (75.8 kg)  07/25/22 167 lb 6.4 oz (75.9 kg)  07/17/22 169 lb 12.8 oz (77 kg)    There is no height or weight on file to calculate BMI.  Performance status (ECOG): 1 - Symptomatic but completely ambulatory  PHYSICAL EXAM:   Physical Exam Vitals and nursing note reviewed. Exam conducted with a chaperone present.  Constitutional:      Appearance: Normal appearance.  Cardiovascular:     Rate and Rhythm: Normal rate and regular rhythm.     Pulses: Normal pulses.     Heart sounds: Normal heart sounds.  Pulmonary:     Effort: Pulmonary effort is normal.     Breath sounds: Normal breath sounds.  Abdominal:     Palpations: Abdomen is soft. There is no hepatomegaly, splenomegaly or mass.     Tenderness: There is no abdominal tenderness.  Musculoskeletal:     Right lower leg: No edema.     Left lower leg: No edema.  Lymphadenopathy:     Cervical: No cervical adenopathy.     Right cervical: No superficial, deep or posterior cervical adenopathy.    Left cervical: No superficial, deep or posterior cervical adenopathy.     Upper Body:     Right upper body: No supraclavicular or axillary adenopathy.     Left upper body: No supraclavicular or axillary adenopathy.  Neurological:     General: No focal deficit present.     Mental Status: She is alert and oriented to person, place, and time.  Psychiatric:        Mood and Affect: Mood normal.        Behavior: Behavior normal.      LABS:      Latest Ref Rng & Units 08/08/2022   12:30 PM 07/17/2022    9:45 AM 07/04/2022    8:04 AM  CBC  WBC 4.0 - 10.5 K/uL 3.1  10.5  6.4   Hemoglobin 12.0 - 15.0 g/dL 62.1  30.8  65.7   Hematocrit 36.0 - 46.0 % 37.9  39.3  42.8   Platelets 150 - 400 K/uL 212  251  242       Latest Ref Rng & Units 08/08/2022   12:30 PM 07/17/2022    9:45 AM 07/04/2022    8:04 AM  CMP  Glucose 70 - 99 mg/dL 846  962  952   BUN 6 - 20 mg/dL 8  15  14    Creatinine 0.44 - 1.00 mg/dL 8.41  3.24  4.01   Sodium 135 - 145 mmol/L 137  140  140   Potassium 3.5 - 5.1 mmol/L 3.9  3.8  3.9   Chloride 98 - 111 mmol/L 108  106  106   CO2 22 - 32 mmol/L 22  27  28    Calcium 8.9 - 10.3 mg/dL 8.1  02.7  9.9   Total Protein 6.5 - 8.1 g/dL 7.0  7.1  7.2   Total Bilirubin 0.3 - 1.2 mg/dL 0.6  0.3  0.4   Alkaline Phos 38 - 126 U/L 80  73  81   AST 15 - 41 U/L 13  11  17    ALT 0 - 44 U/L 13  14  17  No results Soto for: "CEA1", "CEA" / No results Soto for: "CEA1", "CEA" No results Soto for: "PSA1" No results Soto for: "IRJ188" No results Soto for: "CAN125"  No results Soto for: "TOTALPROTELP", "ALBUMINELP", "A1GS", "A2GS", "BETS", "BETA2SER", "GAMS", "MSPIKE", "SPEI" No results Soto for: "TIBC", "FERRITIN", "IRONPCTSAT" No results Soto for: "LDH"   STUDIES:   No results Soto.

## 2022-08-22 ENCOUNTER — Inpatient Hospital Stay: Payer: 59 | Admitting: Hematology

## 2022-08-22 ENCOUNTER — Other Ambulatory Visit: Payer: Self-pay

## 2022-08-22 ENCOUNTER — Inpatient Hospital Stay: Payer: 59

## 2022-08-22 VITALS — BP 162/91 | HR 97 | Temp 99.0°F | Resp 19 | Wt 167.6 lb

## 2022-08-22 DIAGNOSIS — C50411 Malignant neoplasm of upper-outer quadrant of right female breast: Secondary | ICD-10-CM

## 2022-08-22 DIAGNOSIS — Z170 Estrogen receptor positive status [ER+]: Secondary | ICD-10-CM

## 2022-08-22 DIAGNOSIS — N6331 Unspecified lump in axillary tail of the right breast: Secondary | ICD-10-CM | POA: Diagnosis not present

## 2022-08-22 DIAGNOSIS — C7951 Secondary malignant neoplasm of bone: Secondary | ICD-10-CM | POA: Diagnosis not present

## 2022-08-22 DIAGNOSIS — Z95828 Presence of other vascular implants and grafts: Secondary | ICD-10-CM

## 2022-08-22 LAB — MAGNESIUM: Magnesium: 1.9 mg/dL (ref 1.7–2.4)

## 2022-08-22 LAB — CBC WITH DIFFERENTIAL/PLATELET
Abs Immature Granulocytes: 0.02 10*3/uL (ref 0.00–0.07)
Basophils Absolute: 0.1 10*3/uL (ref 0.0–0.1)
Basophils Relative: 3 %
Eosinophils Absolute: 0.2 10*3/uL (ref 0.0–0.5)
Eosinophils Relative: 4 %
HCT: 39.3 % (ref 36.0–46.0)
Hemoglobin: 13.4 g/dL (ref 12.0–15.0)
Immature Granulocytes: 0 %
Lymphocytes Relative: 32 %
Lymphs Abs: 1.6 10*3/uL (ref 0.7–4.0)
MCH: 31.2 pg (ref 26.0–34.0)
MCHC: 34.1 g/dL (ref 30.0–36.0)
MCV: 91.6 fL (ref 80.0–100.0)
Monocytes Absolute: 0.6 10*3/uL (ref 0.1–1.0)
Monocytes Relative: 11 %
Neutro Abs: 2.5 10*3/uL (ref 1.7–7.7)
Neutrophils Relative %: 50 %
Platelets: 324 10*3/uL (ref 150–400)
RBC: 4.29 MIL/uL (ref 3.87–5.11)
RDW: 16.2 % — ABNORMAL HIGH (ref 11.5–15.5)
WBC: 5.1 10*3/uL (ref 4.0–10.5)
nRBC: 0 % (ref 0.0–0.2)

## 2022-08-22 LAB — COMPREHENSIVE METABOLIC PANEL
ALT: 19 U/L (ref 0–44)
AST: 18 U/L (ref 15–41)
Albumin: 3.8 g/dL (ref 3.5–5.0)
Alkaline Phosphatase: 85 U/L (ref 38–126)
Anion gap: 6 (ref 5–15)
BUN: 10 mg/dL (ref 6–20)
CO2: 22 mmol/L (ref 22–32)
Calcium: 8.8 mg/dL — ABNORMAL LOW (ref 8.9–10.3)
Chloride: 109 mmol/L (ref 98–111)
Creatinine, Ser: 0.71 mg/dL (ref 0.44–1.00)
GFR, Estimated: >60 mL/min (ref >60)
Glucose, Bld: 127 mg/dL — ABNORMAL HIGH (ref 70–99)
Potassium: 3.7 mmol/L (ref 3.5–5.1)
Sodium: 137 mmol/L (ref 135–145)
Total Bilirubin: 0.5 mg/dL (ref 0.3–1.2)
Total Protein: 6.9 g/dL (ref 6.5–8.1)

## 2022-08-22 MED ORDER — CEPHALEXIN 500 MG PO CAPS: 500.0000 mg | ORAL_CAPSULE | Freq: Three times a day (TID) | ORAL | 0 refills | Status: DC

## 2022-08-22 MED ORDER — HEPARIN SOD (PORK) LOCK FLUSH 100 UNIT/ML IV SOLN
500.0000 [IU] | Freq: Once | INTRAVENOUS | Status: AC
  Administered 2022-08-22: 500 [IU] via INTRAVENOUS

## 2022-08-22 MED ORDER — SODIUM CHLORIDE 0.9% FLUSH
10.0000 mL | INTRAVENOUS | Status: DC | PRN
  Administered 2022-08-22: 10 mL via INTRAVENOUS

## 2022-08-22 NOTE — Progress Notes (Signed)
Patient is taking Kisqali as prescribed.  She has not missed any doses and reports no side effects at this time.

## 2022-08-22 NOTE — Progress Notes (Signed)
 Patients port flushed without difficulty.  Good blood return noted with no bruising or swelling noted at site.  Band aid applied.  VSS with discharge and left in satisfactory condition with no s/s of distress noted.   

## 2022-08-28 ENCOUNTER — Ambulatory Visit: Payer: 59 | Admitting: Hematology

## 2022-08-28 ENCOUNTER — Other Ambulatory Visit: Payer: 59

## 2022-09-05 ENCOUNTER — Inpatient Hospital Stay: Payer: 59 | Admitting: Hematology

## 2022-09-05 ENCOUNTER — Inpatient Hospital Stay: Payer: 59

## 2022-09-12 ENCOUNTER — Other Ambulatory Visit: Payer: 59

## 2022-09-12 ENCOUNTER — Ambulatory Visit: Payer: 59 | Admitting: Hematology

## 2022-09-19 ENCOUNTER — Inpatient Hospital Stay (HOSPITAL_BASED_OUTPATIENT_CLINIC_OR_DEPARTMENT_OTHER): Payer: 59 | Admitting: Hematology

## 2022-09-19 ENCOUNTER — Inpatient Hospital Stay: Payer: 59 | Attending: Hematology and Oncology

## 2022-09-19 ENCOUNTER — Other Ambulatory Visit: Payer: Self-pay

## 2022-09-19 DIAGNOSIS — Z79899 Other long term (current) drug therapy: Secondary | ICD-10-CM | POA: Insufficient documentation

## 2022-09-19 DIAGNOSIS — C50411 Malignant neoplasm of upper-outer quadrant of right female breast: Secondary | ICD-10-CM

## 2022-09-19 DIAGNOSIS — Z833 Family history of diabetes mellitus: Secondary | ICD-10-CM | POA: Diagnosis not present

## 2022-09-19 DIAGNOSIS — Z86010 Personal history of colonic polyps: Secondary | ICD-10-CM | POA: Diagnosis not present

## 2022-09-19 DIAGNOSIS — Z8249 Family history of ischemic heart disease and other diseases of the circulatory system: Secondary | ICD-10-CM | POA: Diagnosis not present

## 2022-09-19 DIAGNOSIS — R234 Changes in skin texture: Secondary | ICD-10-CM | POA: Diagnosis not present

## 2022-09-19 DIAGNOSIS — N644 Mastodynia: Secondary | ICD-10-CM | POA: Insufficient documentation

## 2022-09-19 DIAGNOSIS — Z9049 Acquired absence of other specified parts of digestive tract: Secondary | ICD-10-CM | POA: Diagnosis not present

## 2022-09-19 DIAGNOSIS — Z79811 Long term (current) use of aromatase inhibitors: Secondary | ICD-10-CM | POA: Insufficient documentation

## 2022-09-19 DIAGNOSIS — M2550 Pain in unspecified joint: Secondary | ICD-10-CM | POA: Diagnosis not present

## 2022-09-19 DIAGNOSIS — M533 Sacrococcygeal disorders, not elsewhere classified: Secondary | ICD-10-CM | POA: Diagnosis not present

## 2022-09-19 DIAGNOSIS — C7951 Secondary malignant neoplasm of bone: Secondary | ICD-10-CM | POA: Insufficient documentation

## 2022-09-19 DIAGNOSIS — Z90710 Acquired absence of both cervix and uterus: Secondary | ICD-10-CM | POA: Diagnosis not present

## 2022-09-19 DIAGNOSIS — Z170 Estrogen receptor positive status [ER+]: Secondary | ICD-10-CM | POA: Insufficient documentation

## 2022-09-19 DIAGNOSIS — R110 Nausea: Secondary | ICD-10-CM | POA: Diagnosis not present

## 2022-09-19 DIAGNOSIS — F17210 Nicotine dependence, cigarettes, uncomplicated: Secondary | ICD-10-CM | POA: Insufficient documentation

## 2022-09-19 LAB — COMPREHENSIVE METABOLIC PANEL
ALT: 25 U/L (ref 0–44)
AST: 25 U/L (ref 15–41)
Albumin: 4.1 g/dL (ref 3.5–5.0)
Alkaline Phosphatase: 78 U/L (ref 38–126)
Anion gap: 12 (ref 5–15)
BUN: 7 mg/dL (ref 6–20)
CO2: 22 mmol/L (ref 22–32)
Calcium: 8.6 mg/dL — ABNORMAL LOW (ref 8.9–10.3)
Chloride: 104 mmol/L (ref 98–111)
Creatinine, Ser: 0.68 mg/dL (ref 0.44–1.00)
GFR, Estimated: >60 mL/min (ref >60)
Glucose, Bld: 132 mg/dL — ABNORMAL HIGH (ref 70–99)
Potassium: 3.4 mmol/L — ABNORMAL LOW (ref 3.5–5.1)
Sodium: 138 mmol/L (ref 135–145)
Total Bilirubin: 0.4 mg/dL (ref 0.3–1.2)
Total Protein: 7.2 g/dL (ref 6.5–8.1)

## 2022-09-19 LAB — CBC WITH DIFFERENTIAL/PLATELET
Abs Immature Granulocytes: 0.05 10*3/uL (ref 0.00–0.07)
Basophils Absolute: 0.1 10*3/uL (ref 0.0–0.1)
Basophils Relative: 2 %
Eosinophils Absolute: 0.1 10*3/uL (ref 0.0–0.5)
Eosinophils Relative: 2 %
HCT: 40.5 % (ref 36.0–46.0)
Hemoglobin: 13.9 g/dL (ref 12.0–15.0)
Immature Granulocytes: 1 %
Lymphocytes Relative: 39 %
Lymphs Abs: 2.3 10*3/uL (ref 0.7–4.0)
MCH: 32 pg (ref 26.0–34.0)
MCHC: 34.3 g/dL (ref 30.0–36.0)
MCV: 93.3 fL (ref 80.0–100.0)
Monocytes Absolute: 0.6 10*3/uL (ref 0.1–1.0)
Monocytes Relative: 10 %
Neutro Abs: 2.7 10*3/uL (ref 1.7–7.7)
Neutrophils Relative %: 46 %
Platelets: 241 10*3/uL (ref 150–400)
RBC: 4.34 MIL/uL (ref 3.87–5.11)
RDW: 17.7 % — ABNORMAL HIGH (ref 11.5–15.5)
WBC: 5.8 10*3/uL (ref 4.0–10.5)
nRBC: 0 % (ref 0.0–0.2)

## 2022-09-19 LAB — MAGNESIUM: Magnesium: 1.8 mg/dL (ref 1.7–2.4)

## 2022-09-19 MED ORDER — SODIUM CHLORIDE 0.9% FLUSH
10.0000 mL | Freq: Once | INTRAVENOUS | Status: AC
  Administered 2022-09-19: 10 mL via INTRAVENOUS

## 2022-09-19 MED ORDER — HEPARIN SOD (PORK) LOCK FLUSH 100 UNIT/ML IV SOLN
500.0000 [IU] | Freq: Once | INTRAVENOUS | Status: AC
  Administered 2022-09-19: 500 [IU] via INTRAVENOUS

## 2022-09-19 MED ORDER — SULFAMETHOXAZOLE-TRIMETHOPRIM 800-160 MG PO TABS: 1.0000 | ORAL_TABLET | Freq: Two times a day (BID) | ORAL | 0 refills | Status: DC

## 2022-09-19 NOTE — Progress Notes (Signed)
[facility] [address] [place] [address]   Clinic Day:  09/19/22   Referring physician: Assunta Found, MD  Patient Care Team: Assunta Found, MD as PCP - General (Family Medicine) Jena Gauss, Gerrit Friends, MD (Gastroenterology) Antony Blackbird, MD as Consulting Physician (Radiation Oncology) Manus Rudd, MD as Consulting Physician (General Surgery) Pershing Proud, RN as Oncology Nurse Navigator Donnelly Angelica, RN as Oncology Nurse Navigator Doreatha Massed, MD as Medical Oncologist (Medical Oncology) Therese Sarah, RN as Oncology Nurse Navigator (Medical Oncology)   ASSESSMENT & PLAN:   Assessment:  1.  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  Noticed skin changes in the right breast 3 months ago. - Mammogram (06/19/2022): Large 8-9 cm mass in the right breast with 2 abnormal axillary lymph nodes and skin thickening. 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  1.8 cm ill-defined enhancing mass in the lateral left breast suspicious for additional malignancy.  Right breast mass involves right chest wall including pectoralis and intercostal muscles.  Prominent subcentimeter left axillary lymph nodes. - PET scan (07/12/2022): Intensely hypermetabolic right breast mass with right axillary lymph nodes.  Hypermetabolic metastatic lymph node in the posterior triangle of the right neck.  Hypermetabolic left axillary lymph nodes.  No evidence of metastatic adenopathy in the mediastinum/abdomen/pelvis.  No evidence of visceral metastasis.  Multiple skeletal metastasis:  Left sacral ala 2.5 cm, left iliac bone, L4 and T12 vertebral bodies, multiple rib lesions, left scapular lesion. - She was evaluated by Dr. Al Pimple and was recommended treatment with ribociclib and anastrozole. - Germline mutation testing: Negative. - Anastrozole and ribociclib started on 07/25/2022  2.  Social/family history: - She lives at home with her husband.  Used OCP for approximately 16-18 years.  Underwent hysterectomy after endometrial ablation when she was found to have fibroids causing pain.  No family history of breast or other malignancies.  Smokes 1 pack of cigarettes per day.   Plan:  1.  Metastatic ER/PR+, HER2-right breast IDC to the bones: - She is tolerating ribociclib and anastrozole very well. - She reported drainage area in the right breast, occasionally yellow and smelly.?  Tumor necrosis - At last visit I have given her Keflex for 7 days which she used.  She continues to have drainage. - Reviewed labs today: Normal LFTs and creatinine.  CBC grossly normal.  ANC is normal.  Tumor markers are pending. - I have reviewed EKG which showed QTc 478 ms.  Otherwise normal sinus rhythm. - Recommend another course of antibiotics with Bactrim twice daily for 10 days. - She will start cycle 3 of ribociclib today. - RTC 4 weeks for follow-up.  2.  Bone metastasis: - Zometa first dose was given on 07/27/2022. - She will continue Zometa every 12 weeks.  Calcium today is 8.6.  Continue calcium tablet daily.     Orders Placed This Encounter  Procedures   CBC with Differential/Platelet    Standing Status:   Future    Standing Expiration Date:   09/19/2023    Order Specific Question:   Release to patient    Answer:  Immediate   Comprehensive metabolic panel    Standing Status:   Future    Standing Expiration Date:   09/19/2023    Order Specific Question:   Release to patient    Answer:   Immediate   Magnesium    Standing Status:   Future    Standing Expiration Date:    09/19/2023    Order Specific Question:   Release to patient    Answer:   Immediate   Cancer antigen 27.29    Standing Status:   Future    Standing Expiration Date:   09/19/2023   Cancer antigen 15-3    Standing Status:   Future    Standing Expiration Date:   09/19/2023      Alben Deeds Teague,acting as a scribe for Doreatha Massed, MD.,have documented all relevant documentation on the behalf of Doreatha Massed, MD,as directed by  Doreatha Massed, MD while in the presence of Doreatha Massed, MD.  I, Doreatha Massed MD, have reviewed the above documentation for accuracy and completeness, and I agree with the above.      Doreatha Massed, MD   8/14/20245:54 PM  CHIEF COMPLAINT/PURPOSE OF CONSULT:   Diagnosis: metastatic inflammatory right breast cancer   Cancer Staging  Malignant neoplasm of upper-outer quadrant of right breast in female, estrogen receptor positive (HCC) Staging form: Breast, AJCC 8th Edition - Clinical: Stage IV (cT4, cN2, cM1, G2, ER+, PR+, HER2-) - Signed by Rachel Moulds, MD on 07/17/2022    Prior Therapy: none  Current Therapy: Anastrozole and ribociclib   HISTORY OF PRESENT ILLNESS:   Oncology History  Malignant neoplasm of upper-outer quadrant of right breast in female, estrogen receptor positive (HCC)  06/19/2022 Mammogram   Mammogram showed large mass in the right breast which appears to measure 8 to 9 cm on ultrasound, 2 abnormal right axillary lymph node and a third borderline lymph node.  Skin thickening noted along the medial aspect of left breast which may be an extension of the marked skin thickening diffusely on the right.  Otherwise no left breast abnormalities are identified.   06/22/2022 Pathology Results   Needle core biopsy from the right breast upper outer quadrant showed grade 2 IDC, both lymph nodes with metastatic adenocarcinoma.  Prognostic showed ER 90% positive strong staining PR 70% positive strong staining  Ki-67 of 20% HER2 negative by IHC 1+   07/03/2022 Initial Diagnosis   Malignant neoplasm of upper-outer quadrant of right breast in female, estrogen receptor positive (HCC)   07/04/2022 Cancer Staging   Staging form: Breast, AJCC 8th Edition - Clinical: Stage IV (cT4, cN2, cM1, G2, ER+, PR+, HER2-) - Signed by Rachel Moulds, MD on 07/17/2022 Stage prefix: Initial diagnosis Histologic grading system: 3 grade system   07/16/2022 - 07/16/2022 Chemotherapy   Patient is on Treatment Plan : BREAST ADJUVANT DOSE DENSE AC q14d / PACLitaxel q7d      Genetic Testing   [facility] Custom Panel+RNA was Negative. Report date is 07/10/2022.  The Custom Hereditary Cancers Panel offered by [facility] includes sequencing and/or deletion duplication testing of the following 43 genes: APC, ATM, AXIN2, BAP1, BARD1, BMPR1A, BRCA1, BRCA2, BRIP1, CDH1, CDK4, CDKN2A (p14ARF and p16INK4a only), CHEK2, CTNNA1, EPCAM (Deletion/duplication testing only), FH, GREM1 (promoter region duplication testing only), HOXB13, KIT, MBD4, MEN1, MLH1, MSH2, MSH3, MSH6, MUTYH, NF1, NHTL1, PALB2, PDGFRA, PMS2, POLD1, POLE, PTEN, RAD51C, RAD51D, SMAD4, SMARCA4. STK11, TP53, TSC1, TSC2, and VHL.       Monica Soto is a 57 y.o. female presenting  to clinic today for evaluation of metastatic breast cancer at the request of Dr. Al Pimple for a second opinion.  She presented to her PCP with a palpable right breast mass. Diagnostic mammogram and bilateral US on 06/19/22 showed: 8-9 cm mass in right breast; two abnormal right axillary lymph nodes, and a third borderline lymph node; diffuse skin thickening over right breast and along medial aspect of left breast; otherwise, no left breast abnormalities.  Biopsies on 06/22/22 revealed invasive ductal adenocarcinoma, grade 2, to the right breast mass and two lymph nodes, ER 90%+, PR 70%+, Her2- (1+), Ki67 20%.  Staging PET scan on 07/12/22 revealed: hypermetabolism to right breast mass, bilateral axillary lymph nodes, a  right neck lymph node in posterior triangle, and multiple skeletal lesions; no evidence of metastatic adenopathy in mediastinum or abdomen/pelvis or evidence of visceral metastasis.  She also underwent breast MRI on 07/13/22 showing: extensive inflammatory breast carcinoma with malignancy involving most of right breast with marked skin thickening extending to medial left breast and enhancement extending posteriorly to broadly and deeply involve right chest wall, including pectoralis, underlying intercostal muscles, and a small area extending to underlying pleura, and medially to right internal mammary chain; 1.8 cm mass in lateral left breast; prominent but subcentimeter left axillary lymph nodes.  Of note, she had port placed on 07/16/22 and negative genetic testing on 07/04/22.  She discussed treatment options for her now metastatic breast cancer with Dr. Al Pimple on 07/17/22.  Today, she states that she is doing well overall. Her appetite level is at 100%. Her energy level is at 80%.  INTERVAL HISTORY:   Monica Soto is a 57 y.o. female presenting to the clinic today for follow-up of metastatic inflammatory right breast cancer. She was last seen by me on 08/22/22.  Today, she states that she is doing okay overall. Her appetite level is at 100%. Her energy level is at 65%. She is accompanied by her husband.  She notes her right breast biopsy site still has smelly, yellowish discharge that has moved closer to the nipple. She finished Keflex prescribed from her last visit, and reports Keflex improved symptoms while she was taking it but discharge has since come back when she stopped taking it. Her entire right breast itches and she notes occasional sharp pains along the drainage site. She does not scratch her breast. She denies tenderness and pain upon palpation. She has no history of MRSA.   She denies any issues with treatment, but does note anxiety without heart palpitations. During this visit she had  an EKG that was stable.    PAST MEDICAL HISTORY:   Past Medical History: Past Medical History:  Diagnosis Date   Depression    Erosive esophagitis 04/26/2010   EGD by Dr. Jena Gauss, small hiatal hernia   GERD (gastroesophageal reflux disease)    History of stomach ulcers    IBS (irritable bowel syndrome)    Diarrhea predominant   Microscopic colitis 04/26/2010   Colonoscopy by Dr. Jena Gauss, good response with Entocort   Tubular adenoma of colon 04/26/2010   Junction of descending and sigmoid 40 CM from anus    Surgical History: Past Surgical History:  Procedure Laterality Date   ABDOMINAL HYSTERECTOMY     APPENDECTOMY  2/11   Dr. Malvin Johns with a delayed closure   BREAST BIOPSY Right 06/22/2022   [place] RT BREAST BX W LOC DEV 1ST LESION IMG BX SPEC US GUIDE 06/22/2022 [facility] MAMMOGRAPHY   PORTACATH PLACEMENT N/A 07/16/2022  Procedure: INSERTION PORT-A-CATH;  Surgeon: Manus Rudd, MD;  Location: [facility];  Service: General;  Laterality: N/A;   TONSILLECTOMY     TUBAL LIGATION      Social History: Social History   Socioeconomic History   Marital status: Married    Spouse name: Not on file   Number of children: Not on file   Years of education: Not on file   Highest education level: Not on file  Occupational History    Employer: [employer]    Comment: Third shift  Tobacco Use   Smoking status: Every Day    Current packs/day: 0.50    Types: Cigarettes   Smokeless tobacco: Never  Vaping Use   Vaping status: Never Used  Substance and Sexual Activity   Alcohol use: Yes    Alcohol/week: 1.0 standard drink of alcohol    Types: 1 Glasses of wine per week    Comment: seldom   Drug use: No   Sexual activity: Yes    Birth control/protection: Surgical  Other Topics Concern   Not on file  Social History Narrative   Not on file   Social Determinants of Health   Financial Resource Strain: Not on file  Food Insecurity: No Food Insecurity (07/04/2022)    Hunger Vital Sign    Worried About Running Out of Food in the Last Year: Never true    Ran Out of Food in the Last Year: Never true  Transportation Needs: No Transportation Needs (07/04/2022)   PRAPARE - Administrator, Civil Service (Medical): No    Lack of Transportation (Non-Medical): No  Physical Activity: Not on file  Stress: Not on file  Social Connections: Not on file  Intimate Partner Violence: Not on file    Family History: Family History  Problem Relation Age of Onset   Diabetes Mother    Coronary artery disease Mother    Healthy Father     Current Medications:  Current Outpatient Medications:    anastrozole (ARIMIDEX) 1 MG tablet, Take 1 tablet (1 mg total) by mouth daily., Disp: 90 tablet, Rfl: 3   Aspirin-Acetaminophen-Caffeine (GOODY HEADACHE PO), Take 1 packet by mouth 2 (two) times daily as needed (for pain)., Disp: , Rfl:    cephALEXin (KEFLEX) 500 MG capsule, Take 1 capsule (500 mg total) by mouth 3 (three) times daily., Disp: 21 capsule, Rfl: 0   Cholecalciferol 1.25 MG (50000 UT) capsule, Take 1 capsule by mouth once a week., Disp: , Rfl:    HYDROcodone-acetaminophen (NORCO/VICODIN) 5-325 MG tablet, Take 1 tablet by mouth every 6 (six) hours as needed for moderate pain., Disp: 15 tablet, Rfl: 0   lidocaine-prilocaine (EMLA) cream, Apply to affected area once, Disp: 30 g, Rfl: 3   ondansetron (ZOFRAN) 8 MG tablet, Take 1 tab (8 mg) by mouth every 8 hrs as needed for nausea/vomiting. Start third day after doxorubicin/cyclophosphamide chemotherapy., Disp: 30 tablet, Rfl: 1   prochlorperazine (COMPAZINE) 10 MG tablet, Take 1 tablet (10 mg total) by mouth every 6 (six) hours as needed for nausea or vomiting., Disp: 30 tablet, Rfl: 1   ribociclib succ (KISQALI 600MG  DAILY DOSE) 200 MG Therapy Pack, Take 3 tablets (600 mg total) by mouth daily. Take for 21 days on, 7 days off, repeat every 28 days., Disp: 63 tablet, Rfl: 3   sulfamethoxazole-trimethoprim  (BACTRIM DS) 800-160 MG tablet, Take 1 tablet by mouth 2 (two) times daily., Disp: 20 tablet, Rfl: 0   Allergies: No  Known Allergies  REVIEW OF SYSTEMS:   Review of Systems  Constitutional:  Negative for chills, fatigue and fever.  HENT:   Negative for lump/mass, mouth sores, nosebleeds, sore throat and trouble swallowing.   Eyes:  Negative for eye problems.  Respiratory:  Negative for cough and shortness of breath.   Cardiovascular:  Negative for chest pain, leg swelling and palpitations.  Gastrointestinal:  Positive for nausea (occasional). Negative for abdominal pain, constipation, diarrhea and vomiting.  Genitourinary:  Negative for bladder incontinence, difficulty urinating, dysuria, frequency, hematuria and nocturia.   Musculoskeletal:  Positive for arthralgias (right breast pain, 7/10 severity). Negative for back pain, flank pain, myalgias and neck pain.  Skin:  Negative for itching and rash.  Neurological:  Negative for dizziness, headaches and numbness.  Hematological:  Does not bruise/bleed easily.  Psychiatric/Behavioral:  Negative for depression, sleep disturbance and suicidal ideas. The patient is nervous/anxious.   All other systems reviewed and are negative.    VITALS:   There were no vitals taken for this visit.  Wt Readings from Last 3 Encounters:  09/19/22 168 lb 6.4 oz (76.4 kg)  08/22/22 167 lb 9.6 oz (76 kg)  08/08/22 167 lb 3.2 oz (75.8 kg)    There is no height or weight on file to calculate BMI.  Performance status (ECOG): 1 - Symptomatic but completely ambulatory  PHYSICAL EXAM:   Physical Exam Vitals and nursing note reviewed. Exam conducted with a chaperone present.  Constitutional:      Appearance: Normal appearance.  Cardiovascular:     Rate and Rhythm: Normal rate and regular rhythm.     Pulses: Normal pulses.     Heart sounds: Normal heart sounds.  Pulmonary:     Effort: Pulmonary effort is normal.     Breath sounds: Normal breath sounds.   Abdominal:     Palpations: Abdomen is soft. There is no hepatomegaly, splenomegaly or mass.     Tenderness: There is no abdominal tenderness.  Musculoskeletal:     Right lower leg: No edema.     Left lower leg: No edema.  Lymphadenopathy:     Cervical: No cervical adenopathy.     Right cervical: No superficial, deep or posterior cervical adenopathy.    Left cervical: No superficial, deep or posterior cervical adenopathy.     Upper Body:     Right upper body: No supraclavicular or axillary adenopathy.     Left upper body: No supraclavicular or axillary adenopathy.  Neurological:     General: No focal deficit present.     Mental Status: She is alert and oriented to person, place, and time.  Psychiatric:        Mood and Affect: Mood normal.        Behavior: Behavior normal.   Breast Exam Chaperone: Anne Fu, LPN        LABS:      Latest Ref Rng & Units 09/19/2022    2:06 PM 08/22/2022   10:44 AM 08/08/2022   12:30 PM  CBC  WBC 4.0 - 10.5 K/uL 5.8  5.1  3.1   Hemoglobin 12.0 - 15.0 g/dL 48.5  46.2  70.3   Hematocrit 36.0 - 46.0 % 40.5  39.3  37.9   Platelets 150 - 400 K/uL 241  324  212       Latest Ref Rng & Units 09/19/2022    2:06 PM 08/22/2022   10:44 AM 08/08/2022   12:30 PM  CMP  Glucose 70 - 99  mg/dL 657  846  962   BUN 6 - 20 mg/dL 7  10  8    Creatinine 0.44 - 1.00 mg/dL 9.52  8.41  3.24   Sodium 135 - 145 mmol/L 138  137  137   Potassium 3.5 - 5.1 mmol/L 3.4  3.7  3.9   Chloride 98 - 111 mmol/L 104  109  108   CO2 22 - 32 mmol/L 22  22  22    Calcium 8.9 - 10.3 mg/dL 8.6  8.8  8.1   Total Protein 6.5 - 8.1 g/dL 7.2  6.9  7.0   Total Bilirubin 0.3 - 1.2 mg/dL 0.4  0.5  0.6   Alkaline Phos 38 - 126 U/L 78  85  80   AST 15 - 41 U/L 25  18  13    ALT 0 - 44 U/L 25  19  13       No results found for: "CEA1", "CEA" / No results found for: "CEA1", "CEA" No results found for: "PSA1" No results found for: "MWN027" No results found for: "CAN125"  No results  found for: "TOTALPROTELP", "ALBUMINELP", "A1GS", "A2GS", "BETS", "BETA2SER", "GAMS", "MSPIKE", "SPEI" No results found for: "TIBC", "FERRITIN", "IRONPCTSAT" No results found for: "LDH"   STUDIES:   No results found.

## 2022-09-19 NOTE — Progress Notes (Signed)
Patients port flushed without difficulty.  No blood return noted with no bruising or swelling noted at site. Denied pain with flush.  Labs drawn peripherally.   Band aid applied.  VSS with discharge and left in satisfactory condition with no s/s of distress noted.

## 2022-09-19 NOTE — Patient Instructions (Signed)
[facility]  Discharge Instructions  You were seen and examined today by Dr. Ellin Saba.  Dr. Ellin Saba discussed your drainage coming from your breast.  Dr. Ellin Saba sent in a different antibiotic and wants you to keep the area covered with gauze to help absorb the drainage.  Follow-up as scheduled in 4 weeks.    Thank you for choosing [facility] to provide your oncology and hematology care.   To afford each patient quality time with our provider, please arrive at least 15 minutes before your scheduled appointment time. You may need to reschedule your appointment if you arrive late (10 or more minutes). Arriving late affects you and other patients whose appointments are after yours.  Also, if you miss three or more appointments without notifying the office, you may be dismissed from the clinic at the provider's discretion.    Again, thank you for choosing [facility].  Our hope is that these requests will decrease the amount of time that you wait before being seen by our physicians.   If you have a lab appointment with the Cancer Center - please note that after April 8th, all labs will be drawn in the cancer center.  You do not have to check in or register with the main entrance as you have in the past but will complete your check-in at the cancer center.            _____________________________________________________________  Should you have questions after your visit to [facility], please contact our office at [phone] and follow the prompts.  Our office hours are 8:00 a.m. to 4:30 p.m. Monday - Thursday and 8:00 a.m. to 2:30 p.m. Friday.  Please note that voicemails left after 4:00 p.m. may not be returned until the following business day.  We are closed weekends and all major holidays.  You do have access to a nurse 24-7, just call the main number to the clinic [phone] and do not press any  options, hold on the line and a nurse will answer the phone.    For prescription refill requests, have your pharmacy contact our office and allow 72 hours.    Masks are no longer required in the cancer centers. If you would like for your care team to wear a mask while they are taking care of you, please let them know. You may have one support person who is at least 56 years old accompany you for your appointments.

## 2022-09-20 LAB — CANCER ANTIGEN 15-3: CA 15-3: 160 U/mL — ABNORMAL HIGH (ref 0.0–25.0)

## 2022-09-20 LAB — CANCER ANTIGEN 27.29: CA 27.29: 212.5 U/mL — ABNORMAL HIGH (ref 0.0–38.6)

## 2022-09-26 ENCOUNTER — Other Ambulatory Visit: Payer: 59

## 2022-09-26 ENCOUNTER — Ambulatory Visit: Payer: 59 | Admitting: Hematology

## 2022-10-03 ENCOUNTER — Inpatient Hospital Stay: Payer: 59 | Admitting: Hematology

## 2022-10-03 ENCOUNTER — Inpatient Hospital Stay: Payer: 59

## 2022-10-10 ENCOUNTER — Other Ambulatory Visit: Payer: 59

## 2022-10-10 ENCOUNTER — Ambulatory Visit: Payer: 59 | Admitting: Hematology

## 2022-10-21 NOTE — Progress Notes (Signed)
[facility] [address], [place] [address]    Clinic Day:  10/22/2022  Referring physician: Assunta Found, MD  Patient Care Team: Assunta Found, MD as PCP - General (Family Medicine) Jena Gauss, Gerrit Friends, MD (Gastroenterology) Antony Blackbird, MD as Consulting Physician (Radiation Oncology) Manus Rudd, MD as Consulting Physician (General Surgery) Pershing Proud, RN as Oncology Nurse Navigator Donnelly Angelica, RN as Oncology Nurse Navigator Doreatha Massed, MD as Medical Oncologist (Medical Oncology) Therese Sarah, RN as Oncology Nurse Navigator (Medical Oncology)   ASSESSMENT & PLAN:   Assessment: 1.  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  Noticed skin changes in the right breast 3 months ago. - Mammogram (06/19/2022): Large 8-9 cm mass in the right breast with 2 abnormal axillary lymph nodes and skin thickening. 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  1.8 cm ill-defined enhancing mass in the lateral left breast suspicious for additional malignancy.  Right breast mass involves right chest wall including pectoralis and intercostal muscles.  Prominent subcentimeter left axillary lymph nodes. - PET scan (07/12/2022): Intensely hypermetabolic right breast mass with right axillary lymph nodes.  Hypermetabolic metastatic lymph node in the posterior triangle of the right neck.  Hypermetabolic left axillary lymph nodes.  No evidence of metastatic adenopathy in the mediastinum/abdomen/pelvis.  No evidence of visceral metastasis.  Multiple skeletal metastasis:  Left sacral ala 2.5 cm, left iliac bone, L4 and T12 vertebral bodies, multiple rib lesions, left scapular lesion. - She was evaluated by Dr. Al Pimple and was recommended treatment with ribociclib and anastrozole. - Germline mutation testing: Negative. - Anastrozole and ribociclib started on 07/25/2022   2.  Social/family history: - She lives at home with her husband.  Used OCP for approximately 16-18 years.  Underwent hysterectomy after endometrial ablation when she was found to have fibroids causing pain.  No family history of breast or other malignancies.  Smokes 1 pack of cigarettes per day.    Plan: 1.  Metastatic ER/PR+, HER2-right breast IDC to the bones: - She is tolerating ribociclib and anastrozole reasonably well. - She started cycle 4 on 10/18/2022.  First few days she had diarrhea and nausea. - She has completed 10 days of Bactrim for discharge in the breast area.  She reported improvement in the drainage and pain.  However 2 days back, she started having foul-smelling drainage again.  She reported occasional burning/itching with sharp shooting pains lasting few seconds. - Reviewed labs today: Normal LFTs.  CBC was normal.  Tumor markers on 09/19/2022 showed improvement.  CA 15-3 has decreased to 160. - Continue ribociclib 3 weeks on/1 week off along with anastrozole. - We will give her doxycycline 100 mg twice daily for 14 days. - RTC 4 weeks for follow-up.  Will repeat PET CT scan to evaluate response.   2.  Bone metastasis: - Calcium is 8.5.  Continue calcium supplements daily. - She will receive Zometa today and every 12 weeks.    Orders Placed This Encounter  Procedures   NM PET Image Restag (PS) Skull Base To Thigh    Standing Status:   Future  Standing Expiration Date:   10/22/2023    Order Specific Question:   If indicated for the ordered procedure, I authorize the administration of a radiopharmaceutical per Radiology protocol    Answer:   Yes    Order Specific Question:    Is the patient pregnant?    Answer:   No    Order Specific Question:   Preferred imaging location?    Answer:   [facility]      I,Katie Daubenspeck,acting as a Neurosurgeon for [employer], MD.,have documented all relevant documentation on the behalf of Doreatha Massed, MD,as directed by  Doreatha Massed, MD while in the presence of Doreatha Massed, MD.   I, Doreatha Massed MD, have reviewed the above documentation for accuracy and completeness, and I agree with the above.   Doreatha Massed, MD   9/16/20243:13 PM  CHIEF COMPLAINT:   Diagnosis: metastatic inflammatory right breast cancer    Cancer Staging  Malignant neoplasm of upper-outer quadrant of right breast in female, estrogen receptor positive (HCC) Staging form: Breast, AJCC 8th Edition - Clinical: Stage IV (cT4, cN2, cM1, G2, ER+, PR+, HER2-) - Signed by Rachel Moulds, MD on 07/17/2022    Prior Therapy: none  Current Therapy:  Anastrozole and ribociclib    HISTORY OF PRESENT ILLNESS:   Oncology History  Malignant neoplasm of upper-outer quadrant of right breast in female, estrogen receptor positive (HCC)  06/19/2022 Mammogram   Mammogram showed large mass in the right breast which appears to measure 8 to 9 cm on ultrasound, 2 abnormal right axillary lymph node and a third borderline lymph node.  Skin thickening noted along the medial aspect of left breast which may be an extension of the marked skin thickening diffusely on the right.  Otherwise no left breast abnormalities are identified.   06/22/2022 Pathology Results   Needle core biopsy from the right breast upper outer quadrant showed grade 2 IDC, both lymph nodes with metastatic adenocarcinoma.  Prognostic showed ER 90% positive strong staining PR 70% positive strong staining Ki-67 of 20% HER2 negative by IHC 1+   07/03/2022 Initial Diagnosis   Malignant neoplasm of upper-outer quadrant of right breast in female, estrogen receptor  positive (HCC)   07/04/2022 Cancer Staging   Staging form: Breast, AJCC 8th Edition - Clinical: Stage IV (cT4, cN2, cM1, G2, ER+, PR+, HER2-) - Signed by Rachel Moulds, MD on 07/17/2022 Stage prefix: Initial diagnosis Histologic grading system: 3 grade system   07/16/2022 - 07/16/2022 Chemotherapy   Patient is on Treatment Plan : BREAST ADJUVANT DOSE DENSE AC q14d / PACLitaxel q7d      Genetic Testing   [facility] Custom Panel+RNA was Negative. Report date is 07/10/2022.  The Custom Hereditary Cancers Panel offered by [facility] includes sequencing and/or deletion duplication testing of the following 43 genes: APC, ATM, AXIN2, BAP1, BARD1, BMPR1A, BRCA1, BRCA2, BRIP1, CDH1, CDK4, CDKN2A (p14ARF and p16INK4a only), CHEK2, CTNNA1, EPCAM (Deletion/duplication testing only), FH, GREM1 (promoter region duplication testing only), HOXB13, KIT, MBD4, MEN1, MLH1, MSH2, MSH3, MSH6, MUTYH, NF1, NHTL1, PALB2, PDGFRA, PMS2, POLD1, POLE, PTEN, RAD51C, RAD51D, SMAD4, SMARCA4. STK11, TP53, TSC1, TSC2, and VHL.      INTERVAL HISTORY:   Monica Soto is a 57 y.o. female presenting to clinic today for follow up of metastatic inflammatory right breast cancer. She was last seen by me on 09/19/22.  Today, she states that she is doing well overall. Her appetite level is at 100%. Her energy level is at 75%.  PAST MEDICAL HISTORY:  Past Medical History: Past Medical History:  Diagnosis Date   Depression    Erosive esophagitis 04/26/2010   EGD by Dr. Jena Gauss, small hiatal hernia   GERD (gastroesophageal reflux disease)    History of stomach ulcers    IBS (irritable bowel syndrome)    Diarrhea predominant   Microscopic colitis 04/26/2010   Colonoscopy by Dr. Jena Gauss, good response with Entocort   Tubular adenoma of colon 04/26/2010   Junction of descending and sigmoid 40 CM from anus    Surgical History: Past Surgical History:  Procedure Laterality Date   ABDOMINAL HYSTERECTOMY     APPENDECTOMY  2/11   Dr. Malvin Johns  with a delayed closure   BREAST BIOPSY Right 06/22/2022   [place] RT BREAST BX W LOC DEV 1ST LESION IMG BX SPEC US GUIDE 06/22/2022 [facility] MAMMOGRAPHY   PORTACATH PLACEMENT N/A 07/16/2022   Procedure: INSERTION PORT-A-CATH;  Surgeon: Manus Rudd, MD;  Location: [facility];  Service: General;  Laterality: N/A;   TONSILLECTOMY     TUBAL LIGATION      Social History: Social History   Socioeconomic History   Marital status: Married    Spouse name: Not on file   Number of children: Not on file   Years of education: Not on file   Highest education level: Not on file  Occupational History    Employer: [employer]    Comment: Third shift  Tobacco Use   Smoking status: Every Day    Current packs/day: 0.50    Types: Cigarettes   Smokeless tobacco: Never  Vaping Use   Vaping status: Never Used  Substance and Sexual Activity   Alcohol use: Yes    Alcohol/week: 1.0 standard drink of alcohol    Types: 1 Glasses of wine per week    Comment: seldom   Drug use: No   Sexual activity: Yes    Birth control/protection: Surgical  Other Topics Concern   Not on file  Social History Narrative   Not on file   Social Determinants of Health   Financial Resource Strain: Not on file  Food Insecurity: No Food Insecurity (07/04/2022)   Hunger Vital Sign    Worried About Running Out of Food in the Last Year: Never true    Ran Out of Food in the Last Year: Never true  Transportation Needs: No Transportation Needs (07/04/2022)   PRAPARE - Administrator, Civil Service (Medical): No    Lack of Transportation (Non-Medical): No  Physical Activity: Not on file  Stress: Not on file  Social Connections: Not on file  Intimate Partner Violence: Not on file    Family History: Family History  Problem Relation Age of Onset   Diabetes Mother    Coronary artery disease Mother    Healthy Father     Current Medications:  Current Outpatient Medications:    anastrozole  (ARIMIDEX) 1 MG tablet, Take 1 tablet (1 mg total) by mouth daily., Disp: 90 tablet, Rfl: 3   Aspirin-Acetaminophen-Caffeine (GOODY HEADACHE PO), Take 1 packet by mouth 2 (two) times daily as needed (for pain)., Disp: , Rfl:    cephALEXin (KEFLEX) 500 MG capsule, Take 1 capsule (500 mg total) by mouth 3 (three) times daily., Disp: 21 capsule, Rfl: 0   Cholecalciferol 1.25 MG (50000 UT) capsule, Take 1 capsule by mouth once a week., Disp: , Rfl:    HYDROcodone-acetaminophen (NORCO/VICODIN) 5-325 MG tablet, Take 1 tablet by mouth every 6 (six) hours as needed  for moderate pain., Disp: 15 tablet, Rfl: 0   lidocaine-prilocaine (EMLA) cream, Apply to affected area once, Disp: 30 g, Rfl: 3   ondansetron (ZOFRAN) 8 MG tablet, Take 1 tab (8 mg) by mouth every 8 hrs as needed for nausea/vomiting. Start third day after doxorubicin/cyclophosphamide chemotherapy., Disp: 30 tablet, Rfl: 1   prochlorperazine (COMPAZINE) 10 MG tablet, Take 1 tablet (10 mg total) by mouth every 6 (six) hours as needed for nausea or vomiting., Disp: 30 tablet, Rfl: 1   ribociclib succ (KISQALI 600MG  DAILY DOSE) 200 MG Therapy Pack, Take 3 tablets (600 mg total) by mouth daily. Take for 21 days on, 7 days off, repeat every 28 days., Disp: 63 tablet, Rfl: 3   sulfamethoxazole-trimethoprim (BACTRIM DS) 800-160 MG tablet, Take 1 tablet by mouth 2 (two) times daily., Disp: 20 tablet, Rfl: 0   Allergies: No Known Allergies  REVIEW OF SYSTEMS:   Review of Systems  Constitutional:  Negative for chills, fatigue and fever.  HENT:   Negative for lump/mass, mouth sores, nosebleeds, sore throat and trouble swallowing.   Eyes:  Negative for eye problems.  Respiratory:  Negative for cough and shortness of breath.   Cardiovascular:  Negative for chest pain, leg swelling and palpitations.  Gastrointestinal:  Positive for diarrhea and nausea. Negative for abdominal pain, constipation and vomiting.  Genitourinary:  Negative for bladder  incontinence, difficulty urinating, dysuria, frequency, hematuria and nocturia.   Musculoskeletal:  Negative for arthralgias, back pain, flank pain, myalgias and neck pain.  Skin:  Negative for itching and rash.  Neurological:  Negative for dizziness, headaches and numbness.  Hematological:  Does not bruise/bleed easily.  Psychiatric/Behavioral:  Negative for depression, sleep disturbance and suicidal ideas. The patient is not nervous/anxious.   All other systems reviewed and are negative.    VITALS:   There were no vitals taken for this visit.  Wt Readings from Last 3 Encounters:  10/22/22 168 lb 9.6 oz (76.5 kg)  09/19/22 168 lb 6.4 oz (76.4 kg)  08/22/22 167 lb 9.6 oz (76 kg)    There is no height or weight on file to calculate BMI.  Performance status (ECOG): 1 - Symptomatic but completely ambulatory   PHYSICAL EXAM:   Physical Exam Vitals and nursing note reviewed. Exam conducted with a chaperone present.  Constitutional:      Appearance: Normal appearance.  Cardiovascular:     Rate and Rhythm: Normal rate and regular rhythm.     Pulses: Normal pulses.     Heart sounds: Normal heart sounds.  Pulmonary:     Effort: Pulmonary effort is normal.     Breath sounds: Normal breath sounds.  Abdominal:     Palpations: Abdomen is soft. There is no hepatomegaly, splenomegaly or mass.     Tenderness: There is no abdominal tenderness.  Musculoskeletal:     Right lower leg: No edema.     Left lower leg: No edema.  Lymphadenopathy:     Cervical: No cervical adenopathy.     Right cervical: No superficial, deep or posterior cervical adenopathy.    Left cervical: No superficial, deep or posterior cervical adenopathy.     Upper Body:     Right upper body: No supraclavicular or axillary adenopathy.     Left upper body: No supraclavicular or axillary adenopathy.  Neurological:     General: No focal deficit present.     Mental Status: She is alert and oriented to person, place, and  time.  Psychiatric:  Mood and Affect: Mood normal.        Behavior: Behavior normal.     LABS:      Latest Ref Rng & Units 10/22/2022   12:18 PM 09/19/2022    2:06 PM 08/22/2022   10:44 AM  CBC  WBC 4.0 - 10.5 K/uL 6.3  5.8  5.1   Hemoglobin 12.0 - 15.0 g/dL 78.2  95.6  21.3   Hematocrit 36.0 - 46.0 % 37.3  40.5  39.3   Platelets 150 - 400 K/uL 263  241  324       Latest Ref Rng & Units 10/22/2022   12:18 PM 09/19/2022    2:06 PM 08/22/2022   10:44 AM  CMP  Glucose 70 - 99 mg/dL 086  578  469   BUN 6 - 20 mg/dL 11  7  10    Creatinine 0.44 - 1.00 mg/dL 6.29  5.28  4.13   Sodium 135 - 145 mmol/L 136  138  137   Potassium 3.5 - 5.1 mmol/L 3.5  3.4  3.7   Chloride 98 - 111 mmol/L 104  104  109   CO2 22 - 32 mmol/L 21  22  22    Calcium 8.9 - 10.3 mg/dL 8.5  8.6  8.8   Total Protein 6.5 - 8.1 g/dL 6.8  7.2  6.9   Total Bilirubin 0.3 - 1.2 mg/dL 0.4  0.4  0.5   Alkaline Phos 38 - 126 U/L 80  78  85   AST 15 - 41 U/L 29  25  18    ALT 0 - 44 U/L 31  25  19       No results found for: "CEA1", "CEA" / No results found for: "CEA1", "CEA" No results found for: "PSA1" No results found for: "KGM010" No results found for: "CAN125"  No results found for: "TOTALPROTELP", "ALBUMINELP", "A1GS", "A2GS", "BETS", "BETA2SER", "GAMS", "MSPIKE", "SPEI" No results found for: "TIBC", "FERRITIN", "IRONPCTSAT" No results found for: "LDH"   STUDIES:   No results found.

## 2022-10-22 ENCOUNTER — Inpatient Hospital Stay: Payer: 59

## 2022-10-22 ENCOUNTER — Inpatient Hospital Stay: Payer: 59 | Attending: Hematology and Oncology

## 2022-10-22 ENCOUNTER — Inpatient Hospital Stay (HOSPITAL_BASED_OUTPATIENT_CLINIC_OR_DEPARTMENT_OTHER): Payer: 59 | Admitting: Hematology

## 2022-10-22 ENCOUNTER — Ambulatory Visit: Payer: 59

## 2022-10-22 DIAGNOSIS — Z9049 Acquired absence of other specified parts of digestive tract: Secondary | ICD-10-CM | POA: Diagnosis not present

## 2022-10-22 DIAGNOSIS — Z170 Estrogen receptor positive status [ER+]: Secondary | ICD-10-CM

## 2022-10-22 DIAGNOSIS — F17210 Nicotine dependence, cigarettes, uncomplicated: Secondary | ICD-10-CM | POA: Diagnosis not present

## 2022-10-22 DIAGNOSIS — N6320 Unspecified lump in the left breast, unspecified quadrant: Secondary | ICD-10-CM | POA: Diagnosis not present

## 2022-10-22 DIAGNOSIS — Z833 Family history of diabetes mellitus: Secondary | ICD-10-CM | POA: Insufficient documentation

## 2022-10-22 DIAGNOSIS — Z90710 Acquired absence of both cervix and uterus: Secondary | ICD-10-CM | POA: Insufficient documentation

## 2022-10-22 DIAGNOSIS — R234 Changes in skin texture: Secondary | ICD-10-CM | POA: Diagnosis not present

## 2022-10-22 DIAGNOSIS — C50411 Malignant neoplasm of upper-outer quadrant of right female breast: Secondary | ICD-10-CM

## 2022-10-22 DIAGNOSIS — Z79811 Long term (current) use of aromatase inhibitors: Secondary | ICD-10-CM | POA: Diagnosis not present

## 2022-10-22 DIAGNOSIS — R110 Nausea: Secondary | ICD-10-CM | POA: Diagnosis not present

## 2022-10-22 DIAGNOSIS — Z8249 Family history of ischemic heart disease and other diseases of the circulatory system: Secondary | ICD-10-CM | POA: Insufficient documentation

## 2022-10-22 DIAGNOSIS — R197 Diarrhea, unspecified: Secondary | ICD-10-CM | POA: Insufficient documentation

## 2022-10-22 DIAGNOSIS — C7951 Secondary malignant neoplasm of bone: Secondary | ICD-10-CM | POA: Diagnosis not present

## 2022-10-22 DIAGNOSIS — M533 Sacrococcygeal disorders, not elsewhere classified: Secondary | ICD-10-CM | POA: Diagnosis not present

## 2022-10-22 DIAGNOSIS — Z86010 Personal history of colonic polyps: Secondary | ICD-10-CM | POA: Diagnosis not present

## 2022-10-22 LAB — CBC WITH DIFFERENTIAL/PLATELET
Abs Immature Granulocytes: 0.02 10*3/uL (ref 0.00–0.07)
Basophils Absolute: 0.2 10*3/uL — ABNORMAL HIGH (ref 0.0–0.1)
Basophils Relative: 3 %
Eosinophils Absolute: 0.1 10*3/uL (ref 0.0–0.5)
Eosinophils Relative: 2 %
HCT: 37.3 % (ref 36.0–46.0)
Hemoglobin: 12.9 g/dL (ref 12.0–15.0)
Immature Granulocytes: 0 %
Lymphocytes Relative: 22 %
Lymphs Abs: 1.4 10*3/uL (ref 0.7–4.0)
MCH: 33.3 pg (ref 26.0–34.0)
MCHC: 34.6 g/dL (ref 30.0–36.0)
MCV: 96.4 fL (ref 80.0–100.0)
Monocytes Absolute: 0.2 10*3/uL (ref 0.1–1.0)
Monocytes Relative: 4 %
Neutro Abs: 4.4 10*3/uL (ref 1.7–7.7)
Neutrophils Relative %: 69 %
Platelets: 263 10*3/uL (ref 150–400)
RBC: 3.87 MIL/uL (ref 3.87–5.11)
RDW: 17.4 % — ABNORMAL HIGH (ref 11.5–15.5)
WBC: 6.3 10*3/uL (ref 4.0–10.5)
nRBC: 0 % (ref 0.0–0.2)

## 2022-10-22 LAB — COMPREHENSIVE METABOLIC PANEL
ALT: 31 U/L (ref 0–44)
AST: 29 U/L (ref 15–41)
Albumin: 3.7 g/dL (ref 3.5–5.0)
Alkaline Phosphatase: 80 U/L (ref 38–126)
Anion gap: 11 (ref 5–15)
BUN: 11 mg/dL (ref 6–20)
CO2: 21 mmol/L — ABNORMAL LOW (ref 22–32)
Calcium: 8.5 mg/dL — ABNORMAL LOW (ref 8.9–10.3)
Chloride: 104 mmol/L (ref 98–111)
Creatinine, Ser: 0.82 mg/dL (ref 0.44–1.00)
GFR, Estimated: >60 mL/min (ref >60)
Glucose, Bld: 136 mg/dL — ABNORMAL HIGH (ref 70–99)
Potassium: 3.5 mmol/L (ref 3.5–5.1)
Sodium: 136 mmol/L (ref 135–145)
Total Bilirubin: 0.4 mg/dL (ref 0.3–1.2)
Total Protein: 6.8 g/dL (ref 6.5–8.1)

## 2022-10-22 LAB — MAGNESIUM: Magnesium: 1.6 mg/dL — ABNORMAL LOW (ref 1.7–2.4)

## 2022-10-22 MED ORDER — ZOLEDRONIC ACID 4 MG/100ML IV SOLN
4.0000 mg | Freq: Once | INTRAVENOUS | Status: AC
  Administered 2022-10-22: 4 mg via INTRAVENOUS
  Filled 2022-10-22: qty 100

## 2022-10-22 MED ORDER — SODIUM CHLORIDE 0.9% FLUSH
10.0000 mL | Freq: Once | INTRAVENOUS | Status: AC | PRN
  Administered 2022-10-22: 10 mL

## 2022-10-22 MED ORDER — SODIUM CHLORIDE 0.9 % IV SOLN: Freq: Once | INTRAVENOUS | Status: AC

## 2022-10-22 MED ORDER — HEPARIN SOD (PORK) LOCK FLUSH 100 UNIT/ML IV SOLN
500.0000 [IU] | Freq: Once | INTRAVENOUS | Status: AC | PRN
  Administered 2022-10-22: 500 [IU]

## 2022-10-22 MED ORDER — SODIUM CHLORIDE 0.9% FLUSH
10.0000 mL | INTRAVENOUS | Status: AC
  Administered 2022-10-22: 10 mL

## 2022-10-22 NOTE — Progress Notes (Signed)
Patient tolerated therapy with no complaints voiced.  Side effects with management reviewed with understanding verbalized.  Port site clean and dry with no bruising or swelling noted at site.  Good blood return noted before and after administration of therapy.  Band aid applied.  Patient left in satisfactory condition with VSS and no s/s of distress noted.  

## 2022-10-22 NOTE — Patient Instructions (Signed)
[facility]  Discharge Instructions: Thank you for choosing [facility] to provide your oncology and hematology care.  If you have a lab appointment with the Cancer Center - please note that after April 8th, 2024, all labs will be drawn in the cancer center.  You do not have to check in or register with the main entrance as you have in the past but will complete your check-in in the cancer center.  Wear comfortable clothing and clothing appropriate for easy access to any Portacath or PICC line.   We strive to give you quality time with your provider. You may need to reschedule your appointment if you arrive late (15 or more minutes).  Arriving late affects you and other patients whose appointments are after yours.  Also, if you miss three or more appointments without notifying the office, you may be dismissed from the clinic at the provider's discretion.      For prescription refill requests, have your pharmacy contact our office and allow 72 hours for refills to be completed.     To help prevent nausea and vomiting after your treatment, we encourage you to take your nausea medication as directed.  BELOW ARE SYMPTOMS THAT SHOULD BE REPORTED IMMEDIATELY: *FEVER GREATER THAN 100.4 F (38 C) OR HIGHER *CHILLS OR SWEATING *NAUSEA AND VOMITING THAT IS NOT CONTROLLED WITH YOUR NAUSEA MEDICATION *UNUSUAL SHORTNESS OF BREATH *UNUSUAL BRUISING OR BLEEDING *URINARY PROBLEMS (pain or burning when urinating, or frequent urination) *BOWEL PROBLEMS (unusual diarrhea, constipation, pain near the anus) TENDERNESS IN MOUTH AND THROAT WITH OR WITHOUT PRESENCE OF ULCERS (sore throat, sores in mouth, or a toothache) UNUSUAL RASH, SWELLING OR PAIN  UNUSUAL VAGINAL DISCHARGE OR ITCHING   Items with * indicate a potential emergency and should be followed up as soon as possible or go to the Emergency Department if any problems should occur.  Please show the CHEMOTHERAPY ALERT  CARD or IMMUNOTHERAPY ALERT CARD at check-in to the Emergency Department and triage nurse.  Should you have questions after your visit or need to cancel or reschedule your appointment, please contact [facility] AT [facility] [phone]  and follow the prompts.  Office hours are 8:00 a.m. to 4:30 p.m. Monday - Friday. Please note that voicemails left after 4:00 p.m. may not be returned until the following business day.  We are closed weekends and major holidays. You have access to a nurse at all times for urgent questions. Please call the main number to the clinic [phone] and follow the prompts.  For any non-urgent questions, you may also contact your provider using MyChart. We now offer e-Visits for anyone 42 and older to request care online for non-urgent symptoms. For details visit [email or web address].   Also download the MyChart app! Go to the app store, search "MyChart", open the app, select [place], and log in with your MyChart username and password.

## 2022-10-22 NOTE — Patient Instructions (Addendum)
[facility] at [facility] Discharge Instructions   You were seen and examined today by Dr. Ellin Saba.  He reviewed the results of your lab work which are normal/stable. Your cancer tumor markers are pending from today.   We will proceed with your Zometa infusion today and every 3 months.   Continue Kisqali as prescribed.   We will see you back in 4 weeks. We will repeat a PET scan and lab work prior to your next visit.   Return as scheduled.    Thank you for choosing [facility] at [facility] to provide your oncology and hematology care.  To afford each patient quality time with our provider, please arrive at least 15 minutes before your scheduled appointment time.   If you have a lab appointment with the Cancer Center please come in thru the Main Entrance and check in at the main information desk.  You need to re-schedule your appointment should you arrive 10 or more minutes late.  We strive to give you quality time with our providers, and arriving late affects you and other patients whose appointments are after yours.  Also, if you no show three or more times for appointments you may be dismissed from the clinic at the providers discretion.     Again, thank you for choosing [facility].  Our hope is that these requests will decrease the amount of time that you wait before being seen by our physicians.       _____________________________________________________________  Should you have questions after your visit to [facility], please contact our office at [phone] and follow the prompts.  Our office hours are 8:00 a.m. and 4:30 p.m. Monday - Friday.  Please note that voicemails left after 4:00 p.m. may not be returned until the following business day.  We are closed weekends and major holidays.  You do have access to a nurse 24-7, just call the main number to the clinic [phone] and do not press any options,  hold on the line and a nurse will answer the phone.    For prescription refill requests, have your pharmacy contact our office and allow 72 hours.    Due to Covid, you will need to wear a mask upon entering the hospital. If you do not have a mask, a mask will be given to you at the Main Entrance upon arrival. For doctor visits, patients may have 1 support person age 13 or older with them. For treatment visits, patients can not have anyone with them due to social distancing guidelines and our immunocompromised population.

## 2022-10-22 NOTE — Progress Notes (Signed)
Patient is taking Kisqali as prescribed.  She has not missed any doses and reports no side effects at this time.

## 2022-10-23 ENCOUNTER — Other Ambulatory Visit: Payer: Self-pay | Admitting: *Deleted

## 2022-10-23 LAB — CANCER ANTIGEN 27.29: CA 27.29: 283.8 U/mL — ABNORMAL HIGH (ref 0.0–38.6)

## 2022-10-23 LAB — CANCER ANTIGEN 15-3: CA 15-3: 193 U/mL — ABNORMAL HIGH (ref 0.0–25.0)

## 2022-10-23 MED ORDER — DOXYCYCLINE HYCLATE 100 MG PO TABS: 100.0000 mg | ORAL_TABLET | Freq: Two times a day (BID) | ORAL | 0 refills | Status: AC

## 2022-10-26 ENCOUNTER — Telehealth: Payer: Self-pay

## 2022-10-26 NOTE — Telephone Encounter (Signed)
Spoke to patient informing her that [employer] was requesting documentation and records for her claim and an updated ROI was needed with her signature. Patient requested ROI be sent to [facility] so she could go there to sign release form. ROI emailed to [employer] nurses to print for patient to come in to sign on Monday or Tuesday.

## 2022-11-01 NOTE — Progress Notes (Signed)
Patient's Disability Forms completed as requested. Request for medical records forwarded to [facility] with signed Release of Information form. Fax transmission confirmations received. Copy of forms at [facility] for Patient to pick up.

## 2022-11-05 ENCOUNTER — Ambulatory Visit: Payer: 59

## 2022-11-08 ENCOUNTER — Other Ambulatory Visit: Payer: Self-pay | Admitting: *Deleted

## 2022-11-08 ENCOUNTER — Telehealth: Payer: Self-pay | Admitting: *Deleted

## 2022-11-08 MED ORDER — SULFAMETHOXAZOLE-TRIMETHOPRIM 800-160 MG PO TABS: 1.0000 | ORAL_TABLET | Freq: Two times a day (BID) | ORAL | 0 refills | Status: DC

## 2022-11-08 NOTE — Telephone Encounter (Signed)
Patient called to advise that she completed her course of doxycycline yesterday for potential breast infection.  Reports that it has not been effective.  Per Dr. Ellin Saba, Bactrim DS BID x 10 days.  Patient notified and verbalized understanding.

## 2022-11-14 ENCOUNTER — Other Ambulatory Visit: Payer: Self-pay | Admitting: Hematology

## 2022-11-14 ENCOUNTER — Other Ambulatory Visit: Payer: Self-pay | Admitting: *Deleted

## 2022-11-14 DIAGNOSIS — C50411 Malignant neoplasm of upper-outer quadrant of right female breast: Secondary | ICD-10-CM

## 2022-11-14 NOTE — Telephone Encounter (Signed)
Kisquali refill approved.  Patient is tolerating and is to continue therapy.

## 2022-11-15 ENCOUNTER — Encounter (HOSPITAL_COMMUNITY)
Admission: RE | Admit: 2022-11-15 | Discharge: 2022-11-15 | Disposition: A | Discharge status: Home / Self Care | Payer: 59 | Source: Ambulatory Visit | Attending: Hematology | Admitting: Hematology

## 2022-11-15 ENCOUNTER — Inpatient Hospital Stay: Payer: 59 | Attending: Hematology and Oncology

## 2022-11-15 DIAGNOSIS — R600 Localized edema: Secondary | ICD-10-CM | POA: Diagnosis not present

## 2022-11-15 DIAGNOSIS — I890 Lymphedema, not elsewhere classified: Secondary | ICD-10-CM | POA: Insufficient documentation

## 2022-11-15 DIAGNOSIS — Z90710 Acquired absence of both cervix and uterus: Secondary | ICD-10-CM | POA: Insufficient documentation

## 2022-11-15 DIAGNOSIS — F17210 Nicotine dependence, cigarettes, uncomplicated: Secondary | ICD-10-CM | POA: Insufficient documentation

## 2022-11-15 DIAGNOSIS — C787 Secondary malignant neoplasm of liver and intrahepatic bile duct: Secondary | ICD-10-CM | POA: Diagnosis not present

## 2022-11-15 DIAGNOSIS — C50411 Malignant neoplasm of upper-outer quadrant of right female breast: Secondary | ICD-10-CM | POA: Insufficient documentation

## 2022-11-15 DIAGNOSIS — Z170 Estrogen receptor positive status [ER+]: Secondary | ICD-10-CM | POA: Insufficient documentation

## 2022-11-15 DIAGNOSIS — Z8249 Family history of ischemic heart disease and other diseases of the circulatory system: Secondary | ICD-10-CM | POA: Diagnosis not present

## 2022-11-15 DIAGNOSIS — C7951 Secondary malignant neoplasm of bone: Secondary | ICD-10-CM | POA: Insufficient documentation

## 2022-11-15 DIAGNOSIS — Z9049 Acquired absence of other specified parts of digestive tract: Secondary | ICD-10-CM | POA: Insufficient documentation

## 2022-11-15 DIAGNOSIS — Z79899 Other long term (current) drug therapy: Secondary | ICD-10-CM | POA: Diagnosis not present

## 2022-11-15 DIAGNOSIS — Z1721 Progesterone receptor positive status: Secondary | ICD-10-CM | POA: Diagnosis not present

## 2022-11-15 DIAGNOSIS — K219 Gastro-esophageal reflux disease without esophagitis: Secondary | ICD-10-CM | POA: Diagnosis not present

## 2022-11-15 DIAGNOSIS — M533 Sacrococcygeal disorders, not elsewhere classified: Secondary | ICD-10-CM | POA: Diagnosis not present

## 2022-11-15 DIAGNOSIS — N6320 Unspecified lump in the left breast, unspecified quadrant: Secondary | ICD-10-CM | POA: Diagnosis not present

## 2022-11-15 DIAGNOSIS — Z833 Family history of diabetes mellitus: Secondary | ICD-10-CM | POA: Insufficient documentation

## 2022-11-15 DIAGNOSIS — Z860101 Personal history of adenomatous and serrated colon polyps: Secondary | ICD-10-CM | POA: Diagnosis not present

## 2022-11-15 DIAGNOSIS — R234 Changes in skin texture: Secondary | ICD-10-CM | POA: Diagnosis not present

## 2022-11-15 DIAGNOSIS — Z79811 Long term (current) use of aromatase inhibitors: Secondary | ICD-10-CM | POA: Insufficient documentation

## 2022-11-15 DIAGNOSIS — M7989 Other specified soft tissue disorders: Secondary | ICD-10-CM | POA: Insufficient documentation

## 2022-11-15 LAB — CBC WITH DIFFERENTIAL/PLATELET
Abs Immature Granulocytes: 0.06 10*3/uL (ref 0.00–0.07)
Basophils Absolute: 0.1 10*3/uL (ref 0.0–0.1)
Basophils Relative: 2 %
Eosinophils Absolute: 0.1 10*3/uL (ref 0.0–0.5)
Eosinophils Relative: 1 %
HCT: 42.6 % (ref 36.0–46.0)
Hemoglobin: 14.1 g/dL (ref 12.0–15.0)
Immature Granulocytes: 1 %
Lymphocytes Relative: 21 %
Lymphs Abs: 1.3 10*3/uL (ref 0.7–4.0)
MCH: 33.2 pg (ref 26.0–34.0)
MCHC: 33.1 g/dL (ref 30.0–36.0)
MCV: 100.2 fL — ABNORMAL HIGH (ref 80.0–100.0)
Monocytes Absolute: 0.6 10*3/uL (ref 0.1–1.0)
Monocytes Relative: 10 %
Neutro Abs: 3.9 10*3/uL (ref 1.7–7.7)
Neutrophils Relative %: 65 %
Platelets: 239 10*3/uL (ref 150–400)
RBC: 4.25 MIL/uL (ref 3.87–5.11)
RDW: 17.1 % — ABNORMAL HIGH (ref 11.5–15.5)
WBC: 6.1 10*3/uL (ref 4.0–10.5)
nRBC: 0 % (ref 0.0–0.2)

## 2022-11-15 LAB — MAGNESIUM: Magnesium: 1.9 mg/dL (ref 1.7–2.4)

## 2022-11-15 LAB — COMPREHENSIVE METABOLIC PANEL
ALT: 28 U/L (ref 0–44)
AST: 28 U/L (ref 15–41)
Albumin: 4.1 g/dL (ref 3.5–5.0)
Alkaline Phosphatase: 84 U/L (ref 38–126)
Anion gap: 10 (ref 5–15)
BUN: 9 mg/dL (ref 6–20)
CO2: 20 mmol/L — ABNORMAL LOW (ref 22–32)
Calcium: 8.6 mg/dL — ABNORMAL LOW (ref 8.9–10.3)
Chloride: 104 mmol/L (ref 98–111)
Creatinine, Ser: 0.77 mg/dL (ref 0.44–1.00)
GFR, Estimated: >60 mL/min (ref >60)
Glucose, Bld: 117 mg/dL — ABNORMAL HIGH (ref 70–99)
Potassium: 3.8 mmol/L (ref 3.5–5.1)
Sodium: 134 mmol/L — ABNORMAL LOW (ref 135–145)
Total Bilirubin: 0.4 mg/dL (ref 0.3–1.2)
Total Protein: 7.4 g/dL (ref 6.5–8.1)

## 2022-11-15 MED ORDER — FLUDEOXYGLUCOSE F - 18 (FDG) INJECTION
9.1700 | Freq: Once | INTRAVENOUS | Status: AC | PRN
  Administered 2022-11-15: 9.17 via INTRAVENOUS

## 2022-11-19 ENCOUNTER — Ambulatory Visit: Payer: 59 | Admitting: Hematology

## 2022-11-21 NOTE — Progress Notes (Signed)
[facility] [address] [place] [address]    Clinic Day:  11/22/2022  Referring physician: Assunta Found, MD  Patient Care Team: Assunta Found, MD as PCP - General (Family Medicine) Jena Gauss, Gerrit Friends, MD (Gastroenterology) Antony Blackbird, MD as Consulting Physician (Radiation Oncology) Manus Rudd, MD as Consulting Physician (General Surgery) Pershing Proud, RN as Oncology Nurse Navigator Donnelly Angelica, RN as Oncology Nurse Navigator Doreatha Massed, MD as Medical Oncologist (Medical Oncology) Therese Sarah, RN as Oncology Nurse Navigator (Medical Oncology)   ASSESSMENT & PLAN:   Assessment: 1.  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  Noticed skin changes in the right breast 3 months ago. - Mammogram (06/19/2022): Large 8-9 cm mass in the right breast with 2 abnormal axillary lymph nodes and skin thickening. 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  1.8 cm ill-defined enhancing mass in the lateral left breast suspicious for additional malignancy.  Right breast mass involves right chest wall including pectoralis and intercostal muscles.  Prominent subcentimeter left axillary lymph nodes. - PET scan (07/12/2022): Intensely hypermetabolic right breast mass with right axillary lymph nodes.  Hypermetabolic metastatic lymph node in the posterior triangle of the right neck.  Hypermetabolic left axillary lymph nodes.  No evidence of metastatic adenopathy in the mediastinum/abdomen/pelvis.  No evidence of visceral metastasis.  Multiple skeletal metastasis:  Left sacral ala 2.5 cm, left iliac bone, L4 and T12 vertebral bodies, multiple rib lesions, left scapular lesion. - She was evaluated by Dr. Al Pimple and was recommended treatment with ribociclib and anastrozole. - Germline mutation testing: Negative. - Anastrozole and ribociclib started on 07/25/2022   2.  Social/family history: - She lives at home with her husband.  Used OCP for approximately 16-18 years.  Underwent hysterectomy after endometrial ablation when she was found to have fibroids causing pain.  No family history of breast or other malignancies.  Smokes 1 pack of cigarettes per day.    Plan: 1.  Metastatic ER/PR+, HER2-right breast IDC to the bones: - She is tolerating ribociclib and anastrozole very well. - Reviewed tumor markers from 10/22/2022: CA 15-3 increased to 193 and CA 27-29 increased to 283. - PET scan (11/15/2022): Mixed response to therapy with overall progression of disease with new hypermetabolic bone metastasis as well as liver metastasis.  New hypermetabolic periportal lymph nodes. - I have recommended discontinuing ribociclib and anastrozole. - She reports some discharge from the breast.  We will give her Bactrim DS twice daily for 14 days.  Doxycycline did not help. - Recommend Guardant360 to check for ESR1 and PIK3CA mutations. - Recommend biopsy of the liver lesion.  Will send for NGS testing. - We talked about next options including systemic chemotherapy.  She also has HER2 1+ positive.  Enhertu is also very good option.  Upon further discussion she has favored Enhertu.  We discussed side effects including rare chance of ILD and congestive heart failure associated with it. - She has an echocardiogram in May which was benign. - She already has a port.  We will tentatively start her on Enhertu as soon as next week.  2.  Bone metastasis: - Calcium is 8.6.  Continue calcium supplements. - Continue Zometa every 12 weeks.  3.  Right upper extremity lymphedema: - We  will send her to lymphedema clinic.    Orders Placed This Encounter  Procedures   CT BIOPSY    Standing Status:   Future    Standing Expiration Date:   11/22/2023    Order Specific Question:   Lab orders requested (DO NOT place separate lab orders, these will be automatically ordered during procedure specimen collection):    Answer:   Surgical Pathology    Order Specific Question:   Reason for Exam (SYMPTOM  OR DIAGNOSIS REQUIRED)    Answer:   new liver mets r/t Stage IV breast cancer possibly    Order Specific Question:   Release to patient    Answer:   Immediate    Order Specific Question:   Preferred location?    Answer:   [facility]      I,Helena R Teague,acting as a scribe for Doreatha Massed, MD.,have documented all relevant documentation on the behalf of Doreatha Massed, MD,as directed by  Doreatha Massed, MD while in the presence of Doreatha Massed, MD.  I, Doreatha Massed MD, have reviewed the above documentation for accuracy and completeness, and I agree with the above.    Doreatha Massed, MD   10/17/20245:57 PM  CHIEF COMPLAINT:   Diagnosis: metastatic inflammatory right breast cancer    Cancer Staging  Malignant neoplasm of upper-outer quadrant of right breast in female, estrogen receptor positive (HCC) Staging form: Breast, AJCC 8th Edition - Clinical: Stage IV (cT4, cN2, cM1, G2, ER+, PR+, HER2-) - Signed by Rachel Moulds, MD on 07/17/2022    Prior Therapy: none  Current Therapy:  Anastrozole and ribociclib    HISTORY OF PRESENT ILLNESS:   Oncology History  Malignant neoplasm of upper-outer quadrant of right breast in female, estrogen receptor positive (HCC)  06/19/2022 Mammogram   Mammogram showed large mass in the right breast which appears to measure 8 to 9 cm on ultrasound, 2 abnormal right axillary lymph node and a third borderline lymph node.  Skin thickening noted along the medial aspect of left breast which may  be an extension of the marked skin thickening diffusely on the right.  Otherwise no left breast abnormalities are identified.   06/22/2022 Pathology Results   Needle core biopsy from the right breast upper outer quadrant showed grade 2 IDC, both lymph nodes with metastatic adenocarcinoma.  Prognostic showed ER 90% positive strong staining PR 70% positive strong staining Ki-67 of 20% HER2 negative by IHC 1+   07/03/2022 Initial Diagnosis   Malignant neoplasm of upper-outer quadrant of right breast in female, estrogen receptor positive (HCC)   07/04/2022 Cancer Staging   Staging form: Breast, AJCC 8th Edition - Clinical: Stage IV (cT4, cN2, cM1, G2, ER+, PR+, HER2-) - Signed by Rachel Moulds, MD on 07/17/2022 Stage prefix: Initial diagnosis Histologic grading system: 3 grade system   07/16/2022 - 07/16/2022 Chemotherapy   Patient is on Treatment Plan : BREAST ADJUVANT DOSE DENSE AC q14d / PACLitaxel q7d      Genetic Testing   [facility] Custom Panel+RNA was Negative. Report date is 07/10/2022.  The Custom Hereditary Cancers Panel offered by [facility] includes sequencing and/or deletion duplication testing of the following 43 genes: APC, ATM, AXIN2, BAP1, BARD1, BMPR1A, BRCA1, BRCA2, BRIP1, CDH1, CDK4, CDKN2A (p14ARF and p16INK4a only), CHEK2, CTNNA1, EPCAM (Deletion/duplication testing only), FH, GREM1 (promoter region duplication testing  only), HOXB13, KIT, MBD4, MEN1, MLH1, MSH2, MSH3, MSH6, MUTYH, NF1, NHTL1, PALB2, PDGFRA, PMS2, POLD1, POLE, PTEN, RAD51C, RAD51D, SMAD4, SMARCA4. STK11, TP53, TSC1, TSC2, and VHL.      INTERVAL HISTORY:   Monica Soto is a 57 y.o. female presenting to clinic today for follow up of metastatic inflammatory right breast cancer. She was last seen by me on 10/22/22.  Since her last visit, she underwent restaging PET on 11/15/22 that found: mixed response to therapy with overall progression of disease; new hypermetabolic skeletal metastasis; new hypermetabolic liver metastasis;  new hypermetabolic periportal lymph nodes; new hypermetabolic cutaneous tissue in the RIGHT breast; new hypermetabolic glandular tissue in the LEFT breast; and persistent hypermetabolic tissue in the RIGHT breast with extension to the skin surface.  Today, she states that she is doing well overall. Her appetite level is at 100%. Her energy level is at 75%.  She c/o right upper extremity edema including the dorsal of the hand. There is soreness of 2 fingers on the right hand due to swelling.  She believes antibiotics mildly improves breast pain while she is taking it, but pain worsens when finished with medication. She notes Bactrim improves symptoms, but did not notice a difference when taking doxycycline. She notes right breast drainage that is yellow or clear in color. She has been using saline and OTC neosporin at the drainage site. She would like to proceed with change in treatment to Enhertu if necessary.   PAST MEDICAL HISTORY:   Past Medical History: Past Medical History:  Diagnosis Date   Depression    Erosive esophagitis 04/26/2010   EGD by Dr. Jena Gauss, small hiatal hernia   GERD (gastroesophageal reflux disease)    History of stomach ulcers    IBS (irritable bowel syndrome)    Diarrhea predominant   Microscopic colitis 04/26/2010   Colonoscopy by Dr. Jena Gauss, good response with Entocort   Tubular adenoma of colon 04/26/2010   Junction of descending and sigmoid 40 CM from anus    Surgical History: Past Surgical History:  Procedure Laterality Date   ABDOMINAL HYSTERECTOMY     APPENDECTOMY  2/11   Dr. Malvin Johns with a delayed closure   BREAST BIOPSY Right 06/22/2022   [place] RT BREAST BX W LOC DEV 1ST LESION IMG BX SPEC US GUIDE 06/22/2022 [facility] MAMMOGRAPHY   PORTACATH PLACEMENT N/A 07/16/2022   Procedure: INSERTION PORT-A-CATH;  Surgeon: Manus Rudd, MD;  Location: [facility];  Service: General;  Laterality: N/A;   TONSILLECTOMY     TUBAL LIGATION      Social  History: Social History   Socioeconomic History   Marital status: Married    Spouse name: Not on file   Number of children: Not on file   Years of education: Not on file   Highest education level: Not on file  Occupational History    Employer: [employer]    Comment: Third shift  Tobacco Use   Smoking status: Every Day    Current packs/day: 0.50    Types: Cigarettes   Smokeless tobacco: Never  Vaping Use   Vaping status: Never Used  Substance and Sexual Activity   Alcohol use: Yes    Alcohol/week: 1.0 standard drink of alcohol    Types: 1 Glasses of wine per week    Comment: seldom   Drug use: No   Sexual activity: Yes    Birth control/protection: Surgical  Other Topics Concern   Not on file  Social History Narrative  Not on file   Social Determinants of Health   Financial Resource Strain: Not on file  Food Insecurity: No Food Insecurity (07/04/2022)   Hunger Vital Sign    Worried About Running Out of Food in the Last Year: Never true    Ran Out of Food in the Last Year: Never true  Transportation Needs: No Transportation Needs (07/04/2022)   PRAPARE - Administrator, Civil Service (Medical): No    Lack of Transportation (Non-Medical): No  Physical Activity: Not on file  Stress: Not on file  Social Connections: Not on file  Intimate Partner Violence: Not on file    Family History: Family History  Problem Relation Age of Onset   Diabetes Mother    Coronary artery disease Mother    Healthy Father     Current Medications:  Current Outpatient Medications:    anastrozole (ARIMIDEX) 1 MG tablet, Take 1 tablet (1 mg total) by mouth daily., Disp: 90 tablet, Rfl: 3   Aspirin-Acetaminophen-Caffeine (GOODY HEADACHE PO), Take 1 packet by mouth 2 (two) times daily as needed (for pain)., Disp: , Rfl:    Cholecalciferol 1.25 MG (50000 UT) capsule, Take 1 capsule by mouth once a week., Disp: , Rfl:    HYDROcodone-acetaminophen (NORCO/VICODIN) 5-325 MG  tablet, Take 1 tablet by mouth every 6 (six) hours as needed for moderate pain., Disp: 15 tablet, Rfl: 0   KISQALI, 600 MG DOSE, 200 MG Therapy Pack, TAKE 3 TABLETS BY MOUTH 1 TIME A DAY FOR 21 DAYS OF A 28 DAY CYCLE, Disp: 63 tablet, Rfl: 3   lidocaine-prilocaine (EMLA) cream, Apply to affected area once, Disp: 30 g, Rfl: 3   ondansetron (ZOFRAN) 8 MG tablet, Take 1 tab (8 mg) by mouth every 8 hrs as needed for nausea/vomiting. Start third day after doxorubicin/cyclophosphamide chemotherapy., Disp: 30 tablet, Rfl: 1   prochlorperazine (COMPAZINE) 10 MG tablet, Take 1 tablet (10 mg total) by mouth every 6 (six) hours as needed for nausea or vomiting., Disp: 30 tablet, Rfl: 1   sulfamethoxazole-trimethoprim (BACTRIM DS) 800-160 MG tablet, Take 1 tablet by mouth 2 (two) times daily., Disp: 28 tablet, Rfl: 0   Allergies: No Known Allergies  REVIEW OF SYSTEMS:   Review of Systems  Constitutional:  Negative for chills, fatigue and fever.  HENT:   Negative for lump/mass, mouth sores, nosebleeds, sore throat and trouble swallowing.   Eyes:  Negative for eye problems.  Respiratory:  Negative for cough and shortness of breath.   Cardiovascular:  Negative for chest pain, leg swelling and palpitations.       +right hand swollen  Gastrointestinal:  Positive for diarrhea and nausea. Negative for abdominal pain, constipation and vomiting.  Genitourinary:  Negative for bladder incontinence, difficulty urinating, dysuria, frequency, hematuria and nocturia.   Musculoskeletal:  Negative for arthralgias, back pain, flank pain, myalgias and neck pain.       +breast pain, 5-6/10 severity  Skin:  Positive for itching (of right breast). Negative for rash.  Neurological:  Negative for dizziness, headaches and numbness.  Hematological:  Does not bruise/bleed easily.  Psychiatric/Behavioral:  Negative for depression, sleep disturbance and suicidal ideas. The patient is nervous/anxious.   All other systems reviewed  and are negative.    VITALS:   Blood pressure (!) 162/98, pulse (!) 105, temperature 98.4 F (36.9 C), resp. rate 18, weight 167 lb 9.6 oz (76 kg), SpO2 93%.  Wt Readings from Last 3 Encounters:  11/22/22 167 lb 9.6 oz (76  kg)  10/22/22 168 lb 9.6 oz (76.5 kg)  09/19/22 168 lb 6.4 oz (76.4 kg)    Body mass index is 27.89 kg/m.  Performance status (ECOG): 1 - Symptomatic but completely ambulatory   PHYSICAL EXAM:   Physical Exam Vitals and nursing note reviewed. Exam conducted with a chaperone present.  Constitutional:      Appearance: Normal appearance.  Cardiovascular:     Rate and Rhythm: Normal rate and regular rhythm.     Pulses: Normal pulses.     Heart sounds: Normal heart sounds.     Comments: +right upper extremity edema including the dorsal of the hand Pulmonary:     Effort: Pulmonary effort is normal.     Breath sounds: Normal breath sounds.  Abdominal:     Palpations: Abdomen is soft. There is no hepatomegaly, splenomegaly or mass.     Tenderness: There is no abdominal tenderness.  Musculoskeletal:     Right lower leg: No edema.     Left lower leg: No edema.  Lymphadenopathy:     Cervical: No cervical adenopathy.     Right cervical: No superficial, deep or posterior cervical adenopathy.    Left cervical: No superficial, deep or posterior cervical adenopathy.     Upper Body:     Right upper body: No supraclavicular or axillary adenopathy.     Left upper body: No supraclavicular or axillary adenopathy.  Neurological:     General: No focal deficit present.     Mental Status: She is alert and oriented to person, place, and time.  Psychiatric:        Mood and Affect: Mood normal.        Behavior: Behavior normal.     LABS:      Latest Ref Rng & Units 11/15/2022    8:38 AM 10/22/2022   12:18 PM 09/19/2022    2:06 PM  CBC  WBC 4.0 - 10.5 K/uL 6.1  6.3  5.8   Hemoglobin 12.0 - 15.0 g/dL 28.4  13.2  44.0   Hematocrit 36.0 - 46.0 % 42.6  37.3  40.5    Platelets 150 - 400 K/uL 239  263  241       Latest Ref Rng & Units 11/15/2022    8:38 AM 10/22/2022   12:18 PM 09/19/2022    2:06 PM  CMP  Glucose 70 - 99 mg/dL 102  725  366   BUN 6 - 20 mg/dL 9  11  7    Creatinine 0.44 - 1.00 mg/dL 4.40  3.47  4.25   Sodium 135 - 145 mmol/L 134  136  138   Potassium 3.5 - 5.1 mmol/L 3.8  3.5  3.4   Chloride 98 - 111 mmol/L 104  104  104   CO2 22 - 32 mmol/L 20  21  22    Calcium 8.9 - 10.3 mg/dL 8.6  8.5  8.6   Total Protein 6.5 - 8.1 g/dL 7.4  6.8  7.2   Total Bilirubin 0.3 - 1.2 mg/dL 0.4  0.4  0.4   Alkaline Phos 38 - 126 U/L 84  80  78   AST 15 - 41 U/L 28  29  25    ALT 0 - 44 U/L 28  31  25       No results found for: "CEA1", "CEA" / No results found for: "CEA1", "CEA" No results found for: "PSA1" No results found for: "ZDG387" No results found for: "FIE332"  No results found for: "  TOTALPROTELP", "ALBUMINELP", "A1GS", "A2GS", "BETS", "BETA2SER", "GAMS", "MSPIKE", "SPEI" No results found for: "TIBC", "FERRITIN", "IRONPCTSAT" No results found for: "LDH"   STUDIES:   NM PET Image Restag (PS) Skull Base To Thigh  Result Date: 11/22/2022 CLINICAL DATA:  Subsequent treatment strategy for breast carcinoma. Restaging exam. Oral chemotherapy ongoing. EXAM: NUCLEAR MEDICINE PET SKULL BASE TO THIGH TECHNIQUE: 9.2 mCi F-18 FDG was injected intravenously. Full-ring PET imaging was performed from the skull base to thigh after the radiotracer. CT data was obtained and used for attenuation correction and anatomic localization. Fasting blood glucose: 104 mg/dl COMPARISON:  PET-CT 16/11/9602 FINDINGS: Mediastinal blood pool activity: SUV max 2.1 Liver activity: SUV max NA NECK: No hypermetabolic lymph nodes in the neck. Incidental CT findings: None. CHEST: Persistent intense hypermetabolic tissue within the RIGHT breast with extension to the skin surface SUV max equal 8.6 on image 73. There is new hypermetabolic skin thickening in the medial aspect of the  RIGHT breast (image 72). New hypermetabolic cutaneous tissue in the lateral aspect RIGHT breast. There is new hypermetabolic glandular tissue within the LEFT breast with SUV max equal 5.3 on image 73. Incidental CT findings: No suspicious pulmonary nodules. Port in the anterior chest wall with tip in distal SVC. ABDOMEN/PELVIS: Unfortunately, there is a new lesion is within the LEFT hepatic lobe (segment 4B) which measures approximately 5 cm and has intense metabolic activity SUV max equal 7.9 on image 95. New hypermetabolic periportal lymph nodes on image 89. Incidental CT findings: None. SKELETON: There is improvement several of the previously seen hypermetabolic skeletal metastasis. For example lesion in theLEFT sacral ala with SUV max equals 3.6 decreased from SUV max 10.2. There is new sclerotic and lytic change at this level (image 125). However, there are clearly new skeletal lesions. For example in the posterior LEFT iliac bone new lesion with SUV max equal 6.7 on image 123. New small lesion in the LEFT femoral neck is with SUV max equal 4.4 image 143. Lesion in the L4 vertebral body is similar. New skeletal lesion in the LEFT aspect of the manubrium on image 54 Incidental CT findings: None. IMPRESSION: 1. Mixed response to therapy with overall progression of disease. 2. New hypermetabolic skeletal metastasis. 3. New hypermetabolic liver metastasis. 4. New hypermetabolic periportal lymph nodes. 5. New hypermetabolic cutaneous tissue in the RIGHT breast. 6. New hypermetabolic glandular tissue in the LEFT breast. 7. Persistent hypermetabolic tissue in the RIGHT breast with extension to the skin surface. Electronically Signed   By: Genevive Bi M.D.   On: 11/22/2022 11:08

## 2022-11-22 ENCOUNTER — Inpatient Hospital Stay: Payer: 59

## 2022-11-22 ENCOUNTER — Encounter: Payer: Self-pay | Admitting: Hematology

## 2022-11-22 ENCOUNTER — Inpatient Hospital Stay: Payer: 59 | Admitting: Hematology

## 2022-11-22 VITALS — BP 162/98 | HR 105 | Temp 98.4°F | Resp 18 | Wt 167.6 lb

## 2022-11-22 DIAGNOSIS — C50411 Malignant neoplasm of upper-outer quadrant of right female breast: Secondary | ICD-10-CM | POA: Diagnosis not present

## 2022-11-22 DIAGNOSIS — Z170 Estrogen receptor positive status [ER+]: Secondary | ICD-10-CM | POA: Diagnosis not present

## 2022-11-22 MED ORDER — SULFAMETHOXAZOLE-TRIMETHOPRIM 800-160 MG PO TABS: 1.0000 | ORAL_TABLET | Freq: Two times a day (BID) | ORAL | 0 refills | Status: DC

## 2022-11-22 NOTE — Patient Instructions (Addendum)
[facility]  Discharge Instructions  You were seen and examined today by Dr. Ellin Saba.  Dr. Ellin Saba discussed your most recent lab work and PET scan which revealed what appears to be progressive cancer. What this means is that the Kisqali is not working. We will request a liver biopsy  We will arrange for additional testing on your biopsy as well as additional labs today.  If this is truly progression, the cancer is rapidly progressing making it quite aggressive. The next treatment option would be chemotherapy or Enhertu which is an antibody that is typically tolerated better than traditional chemotherapy. Dr. Ellin Saba will arrange for you to start Enhertu which is given via your Port-A-Cath here in the Cancer Center once every 3 weeks.  Follow-up as scheduled.  Thank you for choosing [facility] to provide your oncology and hematology care.   To afford each patient quality time with our provider, please arrive at least 15 minutes before your scheduled appointment time. You may need to reschedule your appointment if you arrive late (10 or more minutes). Arriving late affects you and other patients whose appointments are after yours.  Also, if you miss three or more appointments without notifying the office, you may be dismissed from the clinic at the provider's discretion.    Again, thank you for choosing [facility].  Our hope is that these requests will decrease the amount of time that you wait before being seen by our physicians.   If you have a lab appointment with the Cancer Center - please note that after April 8th, all labs will be drawn in the cancer center.  You do not have to check in or register with the main entrance as you have in the past but will complete your check-in at the cancer center.            _____________________________________________________________  Should you have questions after your visit  to [facility], please contact our office at [phone] and follow the prompts.  Our office hours are 8:00 a.m. to 4:30 p.m. Monday - Thursday and 8:00 a.m. to 2:30 p.m. Friday.  Please note that voicemails left after 4:00 p.m. may not be returned until the following business day.  We are closed weekends and all major holidays.  You do have access to a nurse 24-7, just call the main number to the clinic [phone] and do not press any options, hold on the line and a nurse will answer the phone.    For prescription refill requests, have your pharmacy contact our office and allow 72 hours.    Masks are no longer required in the cancer centers. If you would like for your care team to wear a mask while they are taking care of you, please let them know. You may have one support person who is at least 56 years old accompany you for your appointments.

## 2022-11-22 NOTE — Progress Notes (Signed)
DISCONTINUE ON PATHWAY REGIMEN - Breast     Cycles 1 through 4: A cycle is every 14 days:     Doxorubicin      Cyclophosphamide      [id number]-[id number]    Cycles 5 through 16: A cycle is every 7 days:     Paclitaxel   **Always confirm dose/schedule in your pharmacy ordering system**  REASON: Other Reason PRIOR TREATMENT: [id number]: Dose-Dense AC-T (Paclitaxel Weekly) - [Doxorubicin + Cyclophosphamide q14 Days x 4 Cycles, Followed by Paclitaxel 80 mg/m2 Weekly x 12 Weeks] TREATMENT RESPONSE: Unable to Evaluate  START ON PATHWAY REGIMEN - Breast     A cycle is every 21 days:     Fam-trastuzumab deruxtecan-nxki   **Always confirm dose/schedule in your pharmacy ordering system**  Patient Characteristics: Distant Metastases or Locoregional Recurrent Disease - Unresected, M0 or Locally Advanced Unresectable Disease Progressing after Neoadjuvant and Local Therapies, M0, HER2 Low/Negative, ER Positive, Chemotherapy, HER2 Low, Second Line, [employer] or  Not a Candidate for Molecular Targeted Therapy Therapeutic Status: Distant Metastases HER2 Status: Low ER Status: Positive (+) PR Status: Positive (+) Therapy Approach Indicated: Standard Chemotherapy/Endocrine Therapy Line of Therapy: Second Line Intent of Therapy: Non-Curative / Palliative Intent, Discussed with Patient

## 2022-11-23 ENCOUNTER — Other Ambulatory Visit: Payer: Self-pay

## 2022-11-23 ENCOUNTER — Encounter: Payer: Self-pay | Admitting: Hematology and Oncology

## 2022-11-23 ENCOUNTER — Encounter: Payer: Self-pay | Admitting: General Practice

## 2022-11-23 ENCOUNTER — Encounter: Payer: Self-pay | Admitting: Hematology

## 2022-11-23 NOTE — Progress Notes (Unsigned)
Gilmer Mor, DO  Bralin Garry, Katheren Shams, NT OK for US guided left liver mass biopsy.  Hx breast CA. PET avid 4b mass.  11/15/22  [address]       Previous Messages    ----- Message ----- From: Caroleen Hamman, NT Sent: 11/22/2022   2:53 PM EDT To: Ir Procedure Requests Subject: CT Biopsy                                      Procedure: CT Biopsy  Reason: new liver mets r/t Stage IV breast cancer possibly Dx: Malignant neoplasm of upper-outer quadrant of right breast in female, estrogen receptor positive (HCC) [C50.411, Z17.0 (ICD-10-CM)]  History: PET, [place], MRI  Provider: Doreatha Massed, MD  Contact: [phone]

## 2022-11-28 ENCOUNTER — Other Ambulatory Visit: Payer: Self-pay

## 2022-12-05 ENCOUNTER — Ambulatory Visit (HOSPITAL_COMMUNITY): Payer: 59 | Attending: Hematology | Admitting: Physical Therapy

## 2022-12-05 ENCOUNTER — Other Ambulatory Visit: Payer: Self-pay

## 2022-12-05 DIAGNOSIS — I890 Lymphedema, not elsewhere classified: Secondary | ICD-10-CM | POA: Diagnosis present

## 2022-12-05 NOTE — Therapy (Addendum)
OUTPATIENT PHYSICAL THERAPY LYMPHEDEMA EVALUATION  Patient Name: Monica Soto MRN: [id number] DOB:Jan 13, 1966, 57 y.o., female Today's Date: 12/05/2022  END OF SESSION:  PT End of Session - 12/05/22 1533     Visit Number 1    Number of Visits 12    Date for PT Re-Evaluation 01/04/23    Authorization Type E-[employer] OTHER [id number] 06/08/2019   E-[employer] OTHER [id number]    Progress Note Due on Visit 10    PT Start Time 1352    PT Stop Time 1518    PT Time Calculation (min) 86 min    Activity Tolerance Patient tolerated treatment well    Behavior During Therapy [facility] for tasks assessed/performed             Past Medical History:  Diagnosis Date   Depression    Erosive esophagitis 04/26/2010   EGD by Dr. Jena Soto, small hiatal hernia   GERD (gastroesophageal reflux disease)    History of stomach ulcers    IBS (irritable bowel syndrome)    Diarrhea predominant   Microscopic colitis 04/26/2010   Colonoscopy by Dr. Jena Soto, good response with Entocort   Tubular adenoma of colon 04/26/2010   Junction of descending and sigmoid 40 CM from anus   Past Surgical History:  Procedure Laterality Date   ABDOMINAL HYSTERECTOMY     APPENDECTOMY  2/11   Dr. Malvin Soto with a delayed closure   BREAST BIOPSY Right 06/22/2022   [place] RT BREAST BX W LOC DEV 1ST LESION IMG BX SPEC US GUIDE 06/22/2022 [facility]   PORTACATH PLACEMENT N/A 07/16/2022   Procedure: INSERTION PORT-A-CATH;  Surgeon: Monica Rudd, MD;  Location: [facility];  Service: General;  Laterality: N/A;   TONSILLECTOMY     TUBAL LIGATION     Patient Active Problem List   Diagnosis Date Noted   Genetic testing 07/12/2022   Malignant neoplasm of upper-outer quadrant of right breast in female, estrogen receptor positive (HCC) 07/03/2022   Lymphocytic colitis 05/30/2010   GERD 04/17/2010   IRRITABLE BOWEL SYNDROME 02/14/2010   DIARRHEA 02/14/2010    Abdominal pain 02/14/2010    PCP: Dr. Magda Soto PROVIDER: Doreatha Soto  REFERRING DIAG: [phone] (ICD-10-CM) - Malignant neoplasm of upper-outer quadrant of right female breast  THERAPY DIAG:  Lymphedema, not elsewhere classified  Rationale for Evaluation and Treatment: Rehabilitation  ONSET DATE: 07/03/22  SUBJECTIVE:  SUBJECTIVE STATEMENT:  PT states that she was having swelling in her right arm but now it is more in her hand.  She states that it was hard and is a million times better.  She is starting an infusion treatment next month.  She has not gotten a glove or a sleeve at this time.    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  Hypermetabolic metastatic lymph node in the posterior triangle of the right neck.  Hypermetabolic left axillary lymph nodes.  No evidence of metastatic adenopathy in the mediastinum/abdomen/pelvis.  No evidence of visceral metastasis.  Multiple skeletal metastasis: Left sacral ala 2.5 cm, left iliac bone, L4 and T12 vertebral bodies, multiple rib lesions, left scapular lesion PAIN:  Are you having pain? No  PRECAUTIONS: Other: cellulitis  WEIGHT BEARING RESTRICTIONS: No  FALLS:  Has patient fallen in last 6 months? No  LIVING ENVIRONMENT: Lives with: lives with their family OCCUPATION: not working   HAND DOMINANCE: right   PRIOR LEVEL OF FUNCTION: Independent  PATIENT GOALS: less swellin    OBJECTIVE: Note: Objective measures were completed at Evaluation unless otherwise noted.  COGNITION: Overall cognitive status: Within functional limits for tasks assessed   PALPATION: Noted induration    UPPER EXTREMITY  AROM/PROM: wfl      LYMPHEDEMA ASSESSMENTS: Pt has not had surgery to date.    [facility] Left eval  10 cm proximal to olecranon process 28.5   Olecranon process 26.7  10 cm proximal to ulnar styloid process 20  Just proximal to ulnar styloid process 16.8  Across hand at thumb web space 21  At base of 2nd digit 6.6  (Blank rows = not tested)  [facility] Rigt eval  10 cm proximal to olecranon process 29.5  Olecranon process 27  10 cm proximal to ulnar styloid process 23  Just proximal to ulnar styloid process 19  Across hand at thumb web space 22.3  At base of 2nd digit 7.4  (Blank rows = not tested)   TODAY'S TREATMENT:                                                                                                                              DATE: 12/05/22 Educate pt on what lymphedema is and four aspects of controlling edema. Educated pt on UE exercises to include shoulder flexion, abduction, IR/ER, bicep curl, fisting and diaphragm breathing Educate on self manual for 'Rt UE    PATIENT EDUCATION:  Education details: as above  Person educated: Patient Education method: Programmer, multimedia, Demonstration, Tactile cues, Verbal cues, and Handouts Education comprehension: verbalized understanding and returned demonstration  HOME EXERCISE PROGRAM: UE exercises and self manual.  Given measurements to obtain sleeve and glove  ASSESSMENT:  CLINICAL IMPRESSION: Patient is a 57 y.o. female who was seen today for physical therapy evaluation and treatment for Rt UE  lymphedema. Evaluation demonstrates increased edema, and decreased knowledge of lymphedema.  Therapist spent an hour and a half educating patient and teaching self manual and exercises as well as measuring her for a sleeve.  PT swelling never goes down even during the night at this time. [name] may benefit from a compression pump to assist with decreasing pt edema.   [name] will benefit from skilled PT for decongestive  techniques to improve her comfort and improve her skin integrity.    OBJECTIVE IMPAIRMENTS: decreased ROM, increased edema, and decreased skin integrity .   ACTIVITY LIMITATIONS: carrying, lifting, and dressing   PERSONAL FACTORS: 1 comorbidity: metastatic cancer  are also affecting patient's functional outcome.   REHAB POTENTIAL: Good  CLINICAL DECISION MAKING: Evolving/moderate complexity  EVALUATION COMPLEXITY: Moderate  GOALS: Goals reviewed with patient? No  SHORT TERM GOALS: Target date: 12/19/22  PT measurements from elbow to hand to be decreased 1 cm to decrease full feeling  Baseline: Goal status: INITIAL  2.  Pt to be confident in self manual techniques. Baseline:  Goal status: INITIAL  LONG TERM GOALS: Target date: 01/03/23  Pt to have and be able to don and doff compression garments. Baseline:  Goal status: INITIAL  2.  Pt to have and be using a compression pump to assist in decreasing edema in Rt UE  Baseline:  Goal status: INITIAL   PLAN:  PT FREQUENCY: 3x/week  PT DURATION: 4 weeks  PLANNED INTERVENTIONS: 97110-Therapeutic exercises, 97530- Therapeutic activity, [id number]- Neuromuscular re-education, 97535- Self Care, and 09604- Manual therapy  PLAN FOR NEXT SESSION: Begin manual decongestive techniques answer any questions that the pt may have.    Virgina Organ, PT CLT [phone]  12/05/2022, 3:36 PM

## 2022-12-06 ENCOUNTER — Encounter (HOSPITAL_COMMUNITY): Payer: Self-pay | Admitting: Radiology

## 2022-12-06 ENCOUNTER — Other Ambulatory Visit (HOSPITAL_COMMUNITY): Payer: Self-pay | Admitting: Student

## 2022-12-06 ENCOUNTER — Other Ambulatory Visit: Payer: Self-pay

## 2022-12-06 DIAGNOSIS — C50411 Malignant neoplasm of upper-outer quadrant of right female breast: Secondary | ICD-10-CM

## 2022-12-06 NOTE — H&P (Signed)
Chief Complaint: Liver lesion. Request is for liver lesion biopsy for further evaluation of breast cancer metastases.   Referring Physician(s): Katragadda,Sreedhar  Supervising Physician: Roanna Banning  Patient Status: [facility] - Out-pt  History of Present Illness: Monica Soto is a 57 y.o. female  outpatient. History of microscopic colitis, IBS,, GERD, tubular adenoma of the colon erosive esophagitis, stomach ulcers, breast cancer. Found to have hypermetabolic liver lesion on PET scan from 10.17.24. PET scan reads Unfortunately, there is a new lesion is within the LEFT hepatic lobe (segment 4B) which measures approximately 5 cm and has intense metabolic activity SUV max equal 7.9 on image 95. Team is requesting a liver biopsy for further evaluation of metastases.    All labs and medications are within acceptable parameters. NKDA Patient has been NPO since midnight.   Family at bedside.En Patient alert and laying in bed,calm. Endorses back pain she states is related from getting halloween decorations down from her attic. Denies any fevers, headache, chest pain, SOB, cough, abdominal pain, nausea, vomiting or bleeding. Return precautions and treatment recommendations and follow-up discussed with the patient and her family. All  who are agreeable with the plan.    Past Medical History:  Diagnosis Date   Depression    Diabetes mellitus without complication (HCC)    Erosive esophagitis 04/26/2010   EGD by Dr. Jena Gauss, small hiatal hernia   GERD (gastroesophageal reflux disease)    History of stomach ulcers    IBS (irritable bowel syndrome)    Diarrhea predominant   Microscopic colitis 04/26/2010   Colonoscopy by Dr. Jena Gauss, good response with Entocort   Tubular adenoma of colon 04/26/2010   Junction of descending and sigmoid 40 CM from anus    Past Surgical History:  Procedure Laterality Date   ABDOMINAL HYSTERECTOMY     APPENDECTOMY  2/11   Dr. Malvin Johns with a delayed closure    BREAST BIOPSY Right 06/22/2022   [place] RT BREAST BX W LOC DEV 1ST LESION IMG BX SPEC US GUIDE 06/22/2022 [facility]   PORTACATH PLACEMENT N/A 07/16/2022   Procedure: INSERTION PORT-A-CATH;  Surgeon: Manus Rudd, MD;  Location: [facility];  Service: General;  Laterality: N/A;   TONSILLECTOMY     TUBAL LIGATION      Allergies: Patient has no known allergies.  Medications: Prior to Admission medications   Medication Sig Start Date End Date Taking? Authorizing Provider  anastrozole (ARIMIDEX) 1 MG tablet Take 1 tablet (1 mg total) by mouth daily. 07/17/22   Rachel Moulds, MD  Aspirin-Acetaminophen-Caffeine (GOODY HEADACHE PO) Take 1 packet by mouth 2 (two) times daily as needed (for pain).    [provider]  Cholecalciferol 1.25 MG (50000 UT) capsule Take 1 capsule by mouth once a week. 06/07/22   [provider]  HYDROcodone-acetaminophen (NORCO/VICODIN) 5-325 MG tablet Take 1 tablet by mouth every 6 (six) hours as needed for moderate pain. 07/16/22   Manus Rudd, MD  KISQALI, 600 MG DOSE, 200 MG Therapy Pack TAKE 3 TABLETS BY MOUTH 1 TIME A DAY FOR 21 DAYS OF A 28 DAY CYCLE 11/14/22   Doreatha Massed, MD  lidocaine-prilocaine (EMLA) cream Apply to affected area once 07/04/22   Rachel Moulds, MD  ondansetron (ZOFRAN) 8 MG tablet Take 1 tab (8 mg) by mouth every 8 hrs as needed for nausea/vomiting. Start third day after doxorubicin/cyclophosphamide chemotherapy. 07/04/22   Rachel Moulds, MD  prochlorperazine (COMPAZINE) 10 MG tablet Take 1 tablet (10 mg total) by mouth  every 6 (six) hours as needed for nausea or vomiting. 07/04/22   Rachel Moulds, MD  sulfamethoxazole-trimethoprim (BACTRIM DS) 800-160 MG tablet Take 1 tablet by mouth 2 (two) times daily. 11/22/22   Doreatha Massed, MD  albuterol (PROVENTIL HFA;VENTOLIN HFA) 108 (90 Base) MCG/ACT inhaler Inhale 2 puffs into the lungs every 4 (four) hours as needed for wheezing or shortness of  breath. Patient not taking: Reported on 01/11/2016 04/28/15 09/11/18  Hayden Rasmussen, NP     Family History  Problem Relation Age of Onset   Diabetes Mother    Coronary artery disease Mother    Healthy Father     Social History   Socioeconomic History   Marital status: Married    Spouse name: Not on file   Number of children: Not on file   Years of education: Not on file   Highest education level: Not on file  Occupational History    Employer: [employer]    Comment: Third shift  Tobacco Use   Smoking status: Every Day    Current packs/day: 0.50    Types: Cigarettes   Smokeless tobacco: Never  Vaping Use   Vaping status: Never Used  Substance and Sexual Activity   Alcohol use: Yes    Alcohol/week: 1.0 standard drink of alcohol    Types: 1 Glasses of wine per week    Comment: seldom   Drug use: No   Sexual activity: Yes    Birth control/protection: Surgical  Other Topics Concern   Not on file  Social History Narrative   Not on file   Social Determinants of Health   Financial Resource Strain: Not on file  Food Insecurity: No Food Insecurity (07/04/2022)   Hunger Vital Sign    Worried About Running Out of Food in the Last Year: Never true    Ran Out of Food in the Last Year: Never true  Transportation Needs: No Transportation Needs (07/04/2022)   PRAPARE - Administrator, Civil Service (Medical): No    Lack of Transportation (Non-Medical): No  Physical Activity: Not on file  Stress: Not on file  Social Connections: Not on file    Review of Systems: A 12 point ROS discussed and pertinent positives are indicated in the HPI above.  All other systems are negative.  Review of Systems  Constitutional:  Negative for fatigue and fever.  HENT:  Negative for congestion.   Respiratory:  Negative for cough and shortness of breath.   Gastrointestinal:  Negative for abdominal pain, diarrhea, nausea and vomiting.  Musculoskeletal:  Positive for back pain.     Vital Signs: BP (!) 158/79   Pulse (!) 109   Temp 98.3 F (36.8 C) (Oral)   Resp 16   Ht 5\' 5"  (1.651 m)   Wt 168 lb (76.2 kg)   SpO2 94%   BMI 27.96 kg/m     Physical Exam Vitals and nursing note reviewed.  Constitutional:      Appearance: She is well-developed.  HENT:     Head: Normocephalic and atraumatic.     Mouth/Throat:     Mouth: Mucous membranes are dry.  Eyes:     Conjunctiva/sclera: Conjunctivae normal.  Cardiovascular:     Rate and Rhythm: Regular rhythm. Tachycardia present.     Heart sounds: Normal heart sounds.  Pulmonary:     Effort: Pulmonary effort is normal.     Breath sounds: Normal breath sounds.  Musculoskeletal:  General: Normal range of motion.     Cervical back: Normal range of motion.  Skin:    General: Skin is warm.  Neurological:     General: No focal deficit present.     Mental Status: She is alert and oriented to person, place, and time.  Psychiatric:        Mood and Affect: Mood normal.        Behavior: Behavior normal.        Thought Content: Thought content normal.        Judgment: Judgment normal.     Imaging: NM PET Image Restag (PS) Skull Base To Thigh  Result Date: 11/22/2022 CLINICAL DATA:  Subsequent treatment strategy for breast carcinoma. Restaging exam. Oral chemotherapy ongoing. EXAM: NUCLEAR MEDICINE PET SKULL BASE TO THIGH TECHNIQUE: 9.2 mCi F-18 FDG was injected intravenously. Full-ring PET imaging was performed from the skull base to thigh after the radiotracer. CT data was obtained and used for attenuation correction and anatomic localization. Fasting blood glucose: 104 mg/dl COMPARISON:  PET-CT 16/11/9602 FINDINGS: Mediastinal blood pool activity: SUV max 2.1 Liver activity: SUV max NA NECK: No hypermetabolic lymph nodes in the neck. Incidental CT findings: None. CHEST: Persistent intense hypermetabolic tissue within the RIGHT breast with extension to the skin surface SUV max equal 8.6 on image 73. There is  new hypermetabolic skin thickening in the medial aspect of the RIGHT breast (image 72). New hypermetabolic cutaneous tissue in the lateral aspect RIGHT breast. There is new hypermetabolic glandular tissue within the LEFT breast with SUV max equal 5.3 on image 73. Incidental CT findings: No suspicious pulmonary nodules. Port in the anterior chest wall with tip in distal SVC. ABDOMEN/PELVIS: Unfortunately, there is a new lesion is within the LEFT hepatic lobe (segment 4B) which measures approximately 5 cm and has intense metabolic activity SUV max equal 7.9 on image 95. New hypermetabolic periportal lymph nodes on image 89. Incidental CT findings: None. SKELETON: There is improvement several of the previously seen hypermetabolic skeletal metastasis. For example lesion in theLEFT sacral ala with SUV max equals 3.6 decreased from SUV max 10.2. There is new sclerotic and lytic change at this level (image 125). However, there are clearly new skeletal lesions. For example in the posterior LEFT iliac bone new lesion with SUV max equal 6.7 on image 123. New small lesion in the LEFT femoral neck is with SUV max equal 4.4 image 143. Lesion in the L4 vertebral body is similar. New skeletal lesion in the LEFT aspect of the manubrium on image 54 Incidental CT findings: None. IMPRESSION: 1. Mixed response to therapy with overall progression of disease. 2. New hypermetabolic skeletal metastasis. 3. New hypermetabolic liver metastasis. 4. New hypermetabolic periportal lymph nodes. 5. New hypermetabolic cutaneous tissue in the RIGHT breast. 6. New hypermetabolic glandular tissue in the LEFT breast. 7. Persistent hypermetabolic tissue in the RIGHT breast with extension to the skin surface. Electronically Signed   By: Genevive Bi M.D.   On: 11/22/2022 11:08    Labs:  CBC: Recent Labs    09/19/22 1406 10/22/22 1218 11/15/22 0838 12/07/22 1130  WBC 5.8 6.3 6.1 8.4  HGB 13.9 12.9 14.1 14.6  HCT 40.5 37.3 42.6 42.7  PLT  241 263 239 310    COAGS: Recent Labs    12/07/22 1130  INR 1.0    BMP: Recent Labs    08/22/22 1044 09/19/22 1406 10/22/22 1218 11/15/22 0838  NA 137 138 136 134*  K 3.7 3.4* 3.5  3.8  CL 109 104 104 104  CO2 22 22 21* 20*  GLUCOSE 127* 132* 136* 117*  BUN 10 7 11 9   CALCIUM 8.8* 8.6* 8.5* 8.6*  CREATININE 0.71 0.68 0.82 0.77  GFRNONAA >60 >60 >60 >60    LIVER FUNCTION TESTS: Recent Labs    08/22/22 1044 09/19/22 1406 10/22/22 1218 11/15/22 0838  BILITOT 0.5 0.4 0.4 0.4  AST 18 25 29 28   ALT 19 25 31 28   ALKPHOS 85 78 80 84  PROT 6.9 7.2 6.8 7.4  ALBUMIN 3.8 4.1 3.7 4.1      Assessment and Plan:  57 y.o. female outpatient. outpatient. History of microscopic colitis, IBS,, GERD, tubular adenoma of the colon erosive esophagitis, stomach ulcers, breast cancer. Found to have hypermetabolic liver lesion on PET scan from 10.17.24. PET scan reads Unfortunately, there is a new lesion is within the LEFT hepatic lobe (segment 4B) which measures approximately 5 cm and has intense metabolic activity SUV max equal 7.9 on image 95. Team is requesting a liver biopsy for further evaluation of metastases.   All labs and medications are within acceptable parameters. NKDA Patient has been NPO since midnight.   Risks and benefits of liver lesion biopsy  was discussed with the patient and/or patient's family including, but not limited to bleeding, infection, damage to adjacent structures or low yield requiring additional tests.  All of the questions were answered and there is agreement to proceed.  Consent signed and in chart.   Thank you for this interesting consult.  I greatly enjoyed meeting SUKARI GRIST and look forward to participating in their care.  A copy of this report was sent to the requesting provider on this date.  Electronically Signed: Alene Mires, NP 12/07/2022, 12:43 PM   I spent a total of  30 Minutes   in face to face in clinical consultation,  greater than 50% of which was counseling/coordinating care for liver lesion biopsy

## 2022-12-07 ENCOUNTER — Encounter (HOSPITAL_COMMUNITY): Payer: Self-pay

## 2022-12-07 ENCOUNTER — Ambulatory Visit (HOSPITAL_COMMUNITY)
Admission: RE | Admit: 2022-12-07 | Discharge: 2022-12-07 | Disposition: A | Discharge status: Home / Self Care | Payer: 59 | Source: Ambulatory Visit | Attending: Hematology | Admitting: Hematology

## 2022-12-07 DIAGNOSIS — K769 Liver disease, unspecified: Secondary | ICD-10-CM | POA: Insufficient documentation

## 2022-12-07 DIAGNOSIS — C787 Secondary malignant neoplasm of liver and intrahepatic bile duct: Secondary | ICD-10-CM | POA: Insufficient documentation

## 2022-12-07 DIAGNOSIS — Z860101 Personal history of adenomatous and serrated colon polyps: Secondary | ICD-10-CM | POA: Diagnosis not present

## 2022-12-07 DIAGNOSIS — Z853 Personal history of malignant neoplasm of breast: Secondary | ICD-10-CM | POA: Insufficient documentation

## 2022-12-07 DIAGNOSIS — K219 Gastro-esophageal reflux disease without esophagitis: Secondary | ICD-10-CM | POA: Diagnosis not present

## 2022-12-07 DIAGNOSIS — C7951 Secondary malignant neoplasm of bone: Secondary | ICD-10-CM | POA: Diagnosis not present

## 2022-12-07 DIAGNOSIS — Z170 Estrogen receptor positive status [ER+]: Secondary | ICD-10-CM

## 2022-12-07 DIAGNOSIS — K589 Irritable bowel syndrome without diarrhea: Secondary | ICD-10-CM | POA: Insufficient documentation

## 2022-12-07 HISTORY — DX: Type 2 diabetes mellitus without complications: E11.9

## 2022-12-07 LAB — CBC
HCT: 42.7 % (ref 36.0–46.0)
Hemoglobin: 14.6 g/dL (ref 12.0–15.0)
MCH: 34.4 pg — ABNORMAL HIGH (ref 26.0–34.0)
MCHC: 34.2 g/dL (ref 30.0–36.0)
MCV: 100.7 fL — ABNORMAL HIGH (ref 80.0–100.0)
Platelets: 310 10*3/uL (ref 150–400)
RBC: 4.24 MIL/uL (ref 3.87–5.11)
RDW: 14.7 % (ref 11.5–15.5)
WBC: 8.4 10*3/uL (ref 4.0–10.5)
nRBC: 0 % (ref 0.0–0.2)

## 2022-12-07 LAB — PROTIME-INR
INR: 1 (ref 0.8–1.2)
Prothrombin Time: 13.3 s (ref 11.4–15.2)

## 2022-12-07 MED ORDER — GELATIN ABSORBABLE 12-7 MM EX MISC
CUTANEOUS | Status: AC
  Filled 2022-12-07: qty 1

## 2022-12-07 MED ORDER — MIDAZOLAM HCL 2 MG/2ML IJ SOLN
INTRAMUSCULAR | Status: AC | PRN
  Administered 2022-12-07: 1 mg via INTRAVENOUS

## 2022-12-07 MED ORDER — MIDAZOLAM HCL 2 MG/2ML IJ SOLN
INTRAMUSCULAR | Status: AC
  Filled 2022-12-07: qty 2

## 2022-12-07 MED ORDER — FENTANYL CITRATE (PF) 100 MCG/2ML IJ SOLN
INTRAMUSCULAR | Status: AC
  Filled 2022-12-07: qty 2

## 2022-12-07 MED ORDER — FENTANYL CITRATE (PF) 100 MCG/2ML IJ SOLN
INTRAMUSCULAR | Status: AC | PRN
  Administered 2022-12-07: 50 ug via INTRAVENOUS

## 2022-12-07 MED ORDER — GELATIN ABSORBABLE 12-7 MM EX MISC
CUTANEOUS | Status: AC | PRN
  Administered 2022-12-07: 1 via TOPICAL

## 2022-12-07 MED ORDER — SODIUM CHLORIDE 0.9 % IV SOLN: INTRAVENOUS | Status: DC

## 2022-12-07 MED ORDER — HYDROCODONE-ACETAMINOPHEN 5-325 MG PO TABS: 1.0000 | ORAL_TABLET | ORAL | Status: DC | PRN

## 2022-12-07 MED ORDER — LIDOCAINE HCL 1 % IJ SOLN
INTRAMUSCULAR | Status: AC
  Filled 2022-12-07: qty 20

## 2022-12-07 MED ORDER — LIDOCAINE HCL 1 % IJ SOLN
INTRAMUSCULAR | Status: AC | PRN
  Administered 2022-12-07: 10 mL via INTRADERMAL

## 2022-12-07 NOTE — Procedures (Signed)
Vascular and Interventional Radiology Procedure Note  Patient: Monica Soto DOB: [birth year] Medical Record Number: [id number] Note Date/Time: 12/07/22 1:15 PM   Performing Physician: Roanna Banning, MD Assistant(s): None  Diagnosis: Hx breast CA  Procedure: LIVER MASS BIOPSY  Anesthesia: Conscious Sedation Complications: None Estimated Blood Loss: Minimal Specimens: Sent for Pathology  Findings:  Successful Ultrasound-guided biopsy of liver mass. A total of 3 samples were obtained. Hemostasis of the tract was achieved using Gelfoam Slurry Embolization.  Plan: Bed rest for 2 hours.  See detailed procedure note with images in PACS. The patient tolerated the procedure well without incident or complication and was returned to Recovery in stable condition.    Roanna Banning, MD [facility] Inter[facility]   Pager. [phone] Clinic. [phone]

## 2022-12-07 NOTE — Progress Notes (Signed)
Pt stated had not applied her emla cream this am. Pt stated she preferred peripheral IV instead of port accessed this am for this procedure.

## 2022-12-10 ENCOUNTER — Encounter (HOSPITAL_COMMUNITY): Payer: 59 | Admitting: Physical Therapy

## 2022-12-10 NOTE — Progress Notes (Addendum)
Pharmacist Chemotherapy Monitoring - Initial Assessment    Anticipated start date: 12/13/22   The following has been reviewed per standard work regarding the patient's treatment regimen: The patient's diagnosis, treatment plan and drug doses, and organ/hematologic function Lab orders and baseline tests specific to treatment regimen  The treatment plan start date, drug sequencing, and pre-medications Prior authorization status  Patient's documented medication list, including drug-drug interaction screen and prescriptions for anti-emetics and supportive care specific to the treatment regimen The drug concentrations, fluid compatibility, administration routes, and timing of the medications to be used The patient's access for treatment and lifetime cumulative dose history, if applicable  The patient's medication allergies and previous infusion related reactions, if applicable   Changes made to treatment plan:  treatment plan date  Discontinue diphenhydramine from oncology treatment plan --> Add Quzyttir (cetirizine) 10 mg IVPush x 1 as premedication for oncology treatment plan.  T.O. Dr Carilyn Goodpasture, PharmD   Follow up needed:  N/A   Stephens Shire, [facility], 12/10/2022  1:48 PM

## 2022-12-11 LAB — SURGICAL PATHOLOGY

## 2022-12-12 ENCOUNTER — Ambulatory Visit (HOSPITAL_COMMUNITY): Payer: 59 | Attending: Hematology | Admitting: Physical Therapy

## 2022-12-12 DIAGNOSIS — C50411 Malignant neoplasm of upper-outer quadrant of right female breast: Secondary | ICD-10-CM | POA: Insufficient documentation

## 2022-12-12 DIAGNOSIS — R600 Localized edema: Secondary | ICD-10-CM | POA: Diagnosis present

## 2022-12-12 DIAGNOSIS — I890 Lymphedema, not elsewhere classified: Secondary | ICD-10-CM | POA: Diagnosis present

## 2022-12-12 DIAGNOSIS — Z170 Estrogen receptor positive status [ER+]: Secondary | ICD-10-CM | POA: Diagnosis present

## 2022-12-12 DIAGNOSIS — R293 Abnormal posture: Secondary | ICD-10-CM | POA: Insufficient documentation

## 2022-12-12 NOTE — Therapy (Addendum)
OUTPATIENT PHYSICAL THERAPY LYMPHEDEMA TREATMENT  Patient Name: Monica Soto MRN: [id number] DOB:26-Jun-1965, 57 y.o., female Today's Date: 12/12/2022  END OF SESSION:  PT End of Session - 12/12/22 1446     Visit Number 2    Number of Visits 12    Date for PT Re-Evaluation 01/04/23    Authorization Type [employer] OTHER [id number] 06/08/2019   [employer] OTHER [id number]    Progress Note Due on Visit 10    PT Start Time 1303    PT Stop Time 1348    PT Time Calculation (min) 45 min    Activity Tolerance Patient tolerated treatment well    Behavior During Therapy [facility] for tasks assessed/performed             Past Medical History:  Diagnosis Date   Depression    Diabetes mellitus without complication (HCC)    Erosive esophagitis 04/26/2010   EGD by Dr. Jena Gauss, small hiatal hernia   GERD (gastroesophageal reflux disease)    History of stomach ulcers    IBS (irritable bowel syndrome)    Diarrhea predominant   Microscopic colitis 04/26/2010   Colonoscopy by Dr. Jena Gauss, good response with Entocort   Tubular adenoma of colon 04/26/2010   Junction of descending and sigmoid 40 CM from anus   Past Surgical History:  Procedure Laterality Date   ABDOMINAL HYSTERECTOMY     APPENDECTOMY  2/11   Dr. Malvin Johns with a delayed closure   BREAST BIOPSY Right 06/22/2022   [place] RT BREAST BX W LOC DEV 1ST LESION IMG BX SPEC US GUIDE 06/22/2022 [facility]   PORTACATH PLACEMENT N/A 07/16/2022   Procedure: INSERTION PORT-A-CATH;  Surgeon: Manus Rudd, MD;  Location: [facility];  Service: General;  Laterality: N/A;   TONSILLECTOMY     TUBAL LIGATION     Patient Active Problem List   Diagnosis Date Noted   Genetic testing 07/12/2022   Malignant neoplasm of upper-outer quadrant of right breast in female, estrogen receptor positive (HCC) 07/03/2022   Lymphocytic colitis 05/30/2010   GERD 04/17/2010   IRRITABLE BOWEL  SYNDROME 02/14/2010   DIARRHEA 02/14/2010   Abdominal pain 02/14/2010    PCP: Dr. Magda Paganini PROVIDER: Doreatha Massed  REFERRING DIAG: [phone] (ICD-10-CM) - Malignant neoplasm of upper-outer quadrant of right female breast  THERAPY DIAG:  Lymphedema, not elsewhere classified  Rationale for Evaluation and Treatment: Rehabilitation  ONSET DATE: 07/03/22  SUBJECTIVE:  SUBJECTIVE STATEMENT:   Pt states she did not order her sleeve as she had her liver biopsy and was tied up with that.  States she can already tell a little improvement in her UE.  Evaluation:  PT states that she was having swelling in her right arm but now it is more in her hand.  She states that it was hard and is a million times better.  She is starting an infusion treatment next month.  She has not gotten a glove or a sleeve at this time.    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  Hypermetabolic metastatic lymph node in the posterior triangle of the right neck.  Hypermetabolic left axillary lymph nodes.  No evidence of metastatic adenopathy in the mediastinum/abdomen/pelvis.  No evidence of visceral metastasis.  Multiple skeletal metastasis: Left sacral ala 2.5 cm, left iliac bone, L4 and T12 vertebral bodies, multiple rib lesions, left scapular lesion PAIN:  Are you having pain? No  PRECAUTIONS: Other: cellulitis  WEIGHT BEARING RESTRICTIONS: No  FALLS:  Has patient fallen in last 6 months? No  LIVING ENVIRONMENT: Lives with: lives with their family OCCUPATION: not working   HAND DOMINANCE: right   PRIOR LEVEL OF FUNCTION: Independent  PATIENT GOALS: less swellin     OBJECTIVE: Note: Objective measures were completed at Evaluation unless otherwise noted.  COGNITION: Overall cognitive status: Within functional limits for tasks assessed   PALPATION: Noted induration    UPPER EXTREMITY AROM/PROM: wfl      LYMPHEDEMA ASSESSMENTS: Pt has not had surgery to date.    LANDMARK Left eval  10 cm proximal to olecranon process 28.5  Olecranon process 26.7  10 cm proximal to ulnar styloid process 20  Just proximal to ulnar styloid process 16.8  Across hand at thumb web space 21  At base of 2nd digit 6.6  (Blank rows = not tested)  LANDMARK Right eval  10 cm proximal to olecranon process 29.5  Olecranon process 27  10 cm proximal to ulnar styloid process 23  Just proximal to ulnar styloid process 19  Across hand at thumb web space 22.3  At base of 2nd digit 7.4  (Blank rows = not tested)   TODAY'S TREATMENT:                                                                                                                              DATE:  12/12/22 Manual lymph drainage for Rt UE including short neck, deep and superficial abdominal nodes.  Rt axillary inguinal anastomosis.  Extra time spend on fingers as most indurated.  Education on compression; need to acquire the sleeve as instructed last visit.  Discussed compression bandaging if this is not successful.   12/05/22 Educate pt on what lymphedema is and four aspects of controlling edema. Educated pt on UE exercises to include  shoulder flexion, abduction, IR/ER, bicep curl, fisting and diaphragm breathing Educate on self manual for 'Rt UE    PATIENT EDUCATION:  Education details: as above  Person educated: Patient Education method: Programmer, multimedia, Demonstration, Tactile cues, Verbal cues, and Handouts Education comprehension: verbalized understanding and returned demonstration  HOME EXERCISE PROGRAM: UE exercises and self manual.  Given measurements to obtain sleeve and  glove  ASSESSMENT:  CLINICAL IMPRESSION: PT reported self manual/ husband completing massage as well for RT UE.  Continued with mCanual lymph drainage for Rt UE including short neck, deep and superficial abdominal nodes. Completed with route of Rt axillary inguinal anastomosis.  Extra time spend on fingers as most indurated as well as dorsum of hand.  Education on compression; need to acquire the sleeve as instructed last visit.  Discussed compression bandaging if this is not successful in reducing/keeping edema at bay. Pt will continue to benefit from skilled PT for decongestive techniques to improve her comfort and improve her skin integrity.    OBJECTIVE IMPAIRMENTS: decreased ROM, increased edema, and decreased skin integrity .   ACTIVITY LIMITATIONS: carrying, lifting, and dressing   PERSONAL FACTORS: 1 comorbidity: metastatic cancer  are also affecting patient's functional outcome.   REHAB POTENTIAL: Good  CLINICAL DECISION MAKING: Evolving/moderate complexity  EVALUATION COMPLEXITY: Moderate  GOALS: Goals reviewed with patient? No  SHORT TERM GOALS: Target date: 12/19/22  PT measurements from elbow to hand to be decreased 1 cm to decrease full feeling  Baseline: Goal status: INITIAL  2.  Pt to be confident in self manual techniques. Baseline:  Goal status: INITIAL  LONG TERM GOALS: Target date: 01/03/23  Pt to have and be able to don and doff compression garments. Baseline:  Goal status: INITIAL  2.  Pt to have and be using a compression pump to assist in decreasing edema in Rt UE  Baseline:  Goal status: INITIAL   PLAN:  PT FREQUENCY: 3x/week  PT DURATION: 4 weeks  PLANNED INTERVENTIONS: 97110-Therapeutic exercises, 97530- Therapeutic activity, [id number]- Neuromuscular re-education, 97535- Self Care, and 16109- Manual therapy  PLAN FOR NEXT SESSION: Continue with manual decongestive techniques answer any questions that the pt may have.   Measure weekly.    Lurena Nida, PTA/CLT [facility] Ph: [phone]   12/12/2022, 2:52 PM

## 2022-12-12 NOTE — Progress Notes (Signed)
[facility] [address], [place] [address]    Clinic Day:  12/13/2022  Referring physician: Assunta Found, MD  Patient Care Team: Assunta Found, MD as PCP - General (Family Medicine) Jena Gauss, Gerrit Friends, MD (Gastroenterology) Antony Blackbird, MD as Consulting Physician (Radiation Oncology) Manus Rudd, MD as Consulting Physician (General Surgery) Pershing Proud, RN as Oncology Nurse Navigator Donnelly Angelica, RN as Oncology Nurse Navigator Doreatha Massed, MD as Medical Oncologist (Medical Oncology) Therese Sarah, RN as Oncology Nurse Navigator (Medical Oncology)   ASSESSMENT & PLAN:   Assessment: 1.  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  Noticed skin changes in the right breast 3 months ago. - Mammogram (06/19/2022): Large 8-9 cm mass in the right breast with 2 abnormal axillary lymph nodes and skin thickening. 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  1.8 cm ill-defined enhancing mass in the lateral left breast suspicious for additional malignancy.  Right breast mass involves right chest wall including pectoralis and intercostal muscles.  Prominent subcentimeter left axillary lymph nodes. - PET scan (07/12/2022): Intensely hypermetabolic right breast mass with right axillary lymph nodes.  Hypermetabolic metastatic lymph node in the posterior triangle of the right neck.  Hypermetabolic left axillary lymph nodes.  No evidence of metastatic adenopathy in the mediastinum/abdomen/pelvis.  No evidence of visceral metastasis.  Multiple skeletal metastasis:  Left sacral ala 2.5 cm, left iliac bone, L4 and T12 vertebral bodies, multiple rib lesions, left scapular lesion. - She was evaluated by Dr. Al Pimple and was recommended treatment with ribociclib and anastrozole. - Germline mutation testing: Negative. - Anastrozole and ribociclib started on 07/25/2022, discontinued on 12/13/2022 due to progression. - PET scan (11/15/2022): Mixed response to therapy with overall progression of disease with new bone metastasis and liver metastasis.  New periportal lymph nodes. - Liver biopsy (12/07/2022): Metastatic poorly differentiated adenocarcinoma consistent with breast primary.  ER +2% with moderate staining, PR 0%, HER2 (1+). - Guardant360: APC G1312, T p53, MYC amplification, FGF R2 amplification, EGFR amplification   2.  Social/family history: - She lives at home with her husband.  Used OCP for approximately 16-18 years.  Underwent hysterectomy after endometrial ablation when she was found to have fibroids causing pain.  No family history of breast or other malignancies.  Smokes 1 pack of cigarettes per day.    Plan: 1.  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 - I have reviewed Guardant360 results which did not show targetable mutations including ESR 1 or PIK3CA mutation. - Her tumor now transformed into triple negative. - We talked about first-line therapy with pembrolizumab and Abraxane if PD-L1 CPS is more than 10.  She does not have BRCA1/2 mutations.  Abraxane will be given 125 mg/m on days 1 and 8 every 21 days. - If PD-L1 is less than 10, she will be offered chemotherapy.  Options include single agent Abraxane or in combination with carboplatin. - Will also send for NGS testing. - Her breast is still putting out discharge.  She has used doxycycline and Bactrim.  Will give her Keflex 500 mg twice daily for 7 days.   2.  Bone metastasis: - Calcium today is  8.8.  Continue Zometa every 12 weeks.  Continue calcium supplements.  3.  Right upper extremity lymphedema: - We have referred her to lymphedema clinic.    No orders of the defined types were placed in this encounter.     Alben Deeds Teague,acting as a Neurosurgeon for Doreatha Massed, MD.,have documented all relevant documentation on the behalf of Doreatha Massed, MD,as directed by  Doreatha Massed, MD while in the presence of Doreatha Massed, MD.  I, Doreatha Massed MD, have reviewed the above documentation for accuracy and completeness, and I agree with the above.     Doreatha Massed, MD   11/7/20246:01 PM  CHIEF COMPLAINT:   Diagnosis: metastatic inflammatory right breast cancer    Cancer Staging  Malignant neoplasm of upper-outer quadrant of right breast in female, estrogen receptor positive (HCC) Staging form: Breast, AJCC 8th Edition - Clinical: Stage IV (cT4, cN2, cM1, G2, ER+, PR+, HER2-) - Signed by Rachel Moulds, MD on 07/17/2022    Prior Therapy: none  Current Therapy:  Anastrozole and ribociclib    HISTORY OF PRESENT ILLNESS:   Oncology History  Malignant neoplasm of upper-outer quadrant of right breast in female, estrogen receptor positive (HCC)  06/19/2022 Mammogram   Mammogram showed large mass in the right breast which appears to measure 8 to 9 cm on ultrasound, 2 abnormal right axillary lymph node and a third borderline lymph node.  Skin thickening noted along the medial aspect of left breast which may be an extension of the marked skin thickening diffusely on the right.  Otherwise no left breast abnormalities are identified.   06/22/2022 Pathology Results   Needle core biopsy from the right breast upper outer quadrant showed grade 2 IDC, both lymph nodes with metastatic adenocarcinoma.  Prognostic showed ER 90% positive strong staining PR 70% positive strong staining Ki-67 of 20% HER2 negative by IHC 1+   07/03/2022 Initial Diagnosis    Malignant neoplasm of upper-outer quadrant of right breast in female, estrogen receptor positive (HCC)   07/04/2022 Cancer Staging   Staging form: Breast, AJCC 8th Edition - Clinical: Stage IV (cT4, cN2, cM1, G2, ER+, PR+, HER2-) - Signed by Rachel Moulds, MD on 07/17/2022 Stage prefix: Initial diagnosis Histologic grading system: 3 grade system   07/16/2022 - 07/16/2022 Chemotherapy   Patient is on Treatment Plan : BREAST ADJUVANT DOSE DENSE AC q14d / PACLitaxel q7d      Genetic Testing   [facility] Custom Panel+RNA was Negative. Report date is 07/10/2022.  The Custom Hereditary Cancers Panel offered by [facility] includes sequencing and/or deletion duplication testing of the following 43 genes: APC, ATM, AXIN2, BAP1, BARD1, BMPR1A, BRCA1, BRCA2, BRIP1, CDH1, CDK4, CDKN2A (p14ARF and p16INK4a only), CHEK2, CTNNA1, EPCAM (Deletion/duplication testing only), FH, GREM1 (promoter region duplication testing only), HOXB13, KIT, MBD4, MEN1, MLH1, MSH2, MSH3, MSH6, MUTYH, NF1, NHTL1, PALB2, PDGFRA, PMS2, POLD1, POLE, PTEN, RAD51C, RAD51D, SMAD4, SMARCA4. STK11, TP53, TSC1, TSC2, and VHL.   12/13/2022 -  Chemotherapy   Patient is on Treatment Plan : BREAST METASTATIC Fam-Trastuzumab Deruxtecan-nxki (Enhertu) (5.4) q21d        INTERVAL HISTORY:   Monica Soto is a 57 y.o. female presenting to clinic today for follow up of metastatic inflammatory right breast cancer. She was last seen by me on 11/22/22.  Since her last visit, she underwent liver biopsy on 12/07/22 that revealed: metastatic poorly differentiated adenocarcinoma  consistent with breast primary.   Today, she states that she is doing well overall. Her appetite level is at 100%. Her energy level is at 60%. She is accompanied by her husband. She c/o soreness at the liver biopsy site that she has been treating with OTC ibuprofen. She reports occasional sharp pain in the bilateral breasts that last for a few seconds at a time. She states her nipple discharge is  stable, treating with topical Neosporin and has finished her round of Bactrim. She has discontinued Kisqali and will discontinue Anastrozole today.   PAST MEDICAL HISTORY:   Past Medical History: Past Medical History:  Diagnosis Date   Depression    Diabetes mellitus without complication (HCC)    Erosive esophagitis 04/26/2010   EGD by Dr. Jena Gauss, small hiatal hernia   GERD (gastroesophageal reflux disease)    History of stomach ulcers    IBS (irritable bowel syndrome)    Diarrhea predominant   Microscopic colitis 04/26/2010   Colonoscopy by Dr. Jena Gauss, good response with Entocort   Tubular adenoma of colon 04/26/2010   Junction of descending and sigmoid 40 CM from anus    Surgical History: Past Surgical History:  Procedure Laterality Date   ABDOMINAL HYSTERECTOMY     APPENDECTOMY  2/11   Dr. Malvin Johns with a delayed closure   BREAST BIOPSY Right 06/22/2022   [place] RT BREAST BX W LOC DEV 1ST LESION IMG BX SPEC US GUIDE 06/22/2022 [facility] MAMMOGRAPHY   PORTACATH PLACEMENT N/A 07/16/2022   Procedure: INSERTION PORT-A-CATH;  Surgeon: Manus Rudd, MD;  Location: [facility];  Service: General;  Laterality: N/A;   TONSILLECTOMY     TUBAL LIGATION      Social History: Social History   Socioeconomic History   Marital status: Married    Spouse name: Not on file   Number of children: Not on file   Years of education: Not on file   Highest education level: Not on file  Occupational History    Employer: [employer]    Comment: Third shift  Tobacco Use   Smoking status: Every Day    Current packs/day: 0.50    Types: Cigarettes   Smokeless tobacco: Never  Vaping Use   Vaping status: Never Used  Substance and Sexual Activity   Alcohol use: Yes    Alcohol/week: 1.0 standard drink of alcohol    Types: 1 Glasses of wine per week    Comment: seldom   Drug use: No   Sexual activity: Yes    Birth control/protection: Surgical  Other Topics Concern   Not on file   Social History Narrative   Not on file   Social Determinants of Health   Financial Resource Strain: Not on file  Food Insecurity: No Food Insecurity (07/04/2022)   Hunger Vital Sign    Worried About Running Out of Food in the Last Year: Never true    Ran Out of Food in the Last Year: Never true  Transportation Needs: No Transportation Needs (07/04/2022)   PRAPARE - Administrator, Civil Service (Medical): No    Lack of Transportation (Non-Medical): No  Physical Activity: Not on file  Stress: Not on file  Social Connections: Not on file  Intimate Partner Violence: Not on file    Family History: Family History  Problem Relation Age of Onset   Diabetes Mother    Coronary artery disease Mother    Healthy Father     Current Medications:  Current  Outpatient Medications:    anastrozole (ARIMIDEX) 1 MG tablet, Take 1 tablet (1 mg total) by mouth daily., Disp: 90 tablet, Rfl: 3   Aspirin-Acetaminophen-Caffeine (GOODY HEADACHE PO), Take 1 packet by mouth 2 (two) times daily as needed (for pain)., Disp: , Rfl:    cephALEXin (KEFLEX) 500 MG capsule, Take 1 capsule (500 mg total) by mouth 3 (three) times daily., Disp: 21 capsule, Rfl: 1   Cholecalciferol 1.25 MG (50000 UT) capsule, Take 1 capsule by mouth once a week., Disp: , Rfl:    HYDROcodone-acetaminophen (NORCO/VICODIN) 5-325 MG tablet, Take 1 tablet by mouth every 6 (six) hours as needed for moderate pain., Disp: 15 tablet, Rfl: 0   KISQALI, 600 MG DOSE, 200 MG Therapy Pack, TAKE 3 TABLETS BY MOUTH 1 TIME A DAY FOR 21 DAYS OF A 28 DAY CYCLE, Disp: 63 tablet, Rfl: 3   lidocaine-prilocaine (EMLA) cream, Apply to affected area once, Disp: 30 g, Rfl: 3   ondansetron (ZOFRAN) 8 MG tablet, Take 1 tab (8 mg) by mouth every 8 hrs as needed for nausea/vomiting. Start third day after doxorubicin/cyclophosphamide chemotherapy., Disp: 30 tablet, Rfl: 1   prochlorperazine (COMPAZINE) 10 MG tablet, Take 1 tablet (10 mg total) by mouth  every 6 (six) hours as needed for nausea or vomiting., Disp: 30 tablet, Rfl: 1   sulfamethoxazole-trimethoprim (BACTRIM DS) 800-160 MG tablet, Take 1 tablet by mouth 2 (two) times daily., Disp: 28 tablet, Rfl: 0   Allergies: No Known Allergies  REVIEW OF SYSTEMS:   Review of Systems  Constitutional:  Negative for chills, fatigue and fever.  HENT:   Negative for lump/mass, mouth sores, nosebleeds, sore throat and trouble swallowing.   Eyes:  Negative for eye problems.  Respiratory:  Positive for cough. Negative for shortness of breath.   Cardiovascular:  Negative for chest pain, leg swelling and palpitations.  Gastrointestinal:  Negative for abdominal pain, constipation, diarrhea, nausea and vomiting.  Genitourinary:  Negative for bladder incontinence, difficulty urinating, dysuria, frequency, hematuria and nocturia.   Musculoskeletal:  Negative for arthralgias, back pain, flank pain, myalgias and neck pain.       +soreness on right breast biopsy site  Skin:  Negative for itching and rash.  Neurological:  Negative for dizziness, headaches and numbness.  Hematological:  Does not bruise/bleed easily.  Psychiatric/Behavioral:  Negative for depression, sleep disturbance and suicidal ideas. The patient is not nervous/anxious.   All other systems reviewed and are negative.    VITALS:   There were no vitals taken for this visit.  Wt Readings from Last 3 Encounters:  12/13/22 164 lb 6.4 oz (74.6 kg)  12/07/22 168 lb (76.2 kg)  11/22/22 167 lb 9.6 oz (76 kg)    There is no height or weight on file to calculate BMI.  Performance status (ECOG): 1 - Symptomatic but completely ambulatory   PHYSICAL EXAM:   Physical Exam Vitals and nursing note reviewed. Exam conducted with a chaperone present.  Constitutional:      Appearance: Normal appearance.  Cardiovascular:     Rate and Rhythm: Normal rate and regular rhythm.     Pulses: Normal pulses.     Heart sounds: Normal heart sounds.   Pulmonary:     Effort: Pulmonary effort is normal.     Breath sounds: Normal breath sounds.  Abdominal:     Palpations: Abdomen is soft. There is no hepatomegaly, splenomegaly or mass.     Tenderness: There is no abdominal tenderness.  Musculoskeletal:  Right lower leg: No edema.     Left lower leg: No edema.  Lymphadenopathy:     Cervical: No cervical adenopathy.     Right cervical: No superficial, deep or posterior cervical adenopathy.    Left cervical: No superficial, deep or posterior cervical adenopathy.     Upper Body:     Right upper body: No supraclavicular or axillary adenopathy.     Left upper body: No supraclavicular or axillary adenopathy.  Neurological:     General: No focal deficit present.     Mental Status: She is alert and oriented to person, place, and time.  Psychiatric:        Mood and Affect: Mood normal.        Behavior: Behavior normal.     LABS:      Latest Ref Rng & Units 12/13/2022    8:01 AM 12/07/2022   11:30 AM 11/15/2022    8:38 AM  CBC  WBC 4.0 - 10.5 K/uL 10.4  8.4  6.1   Hemoglobin 12.0 - 15.0 g/dL 78.2  95.6  21.3   Hematocrit 36.0 - 46.0 % 38.3  42.7  42.6   Platelets 150 - 400 K/uL 308  310  239       Latest Ref Rng & Units 12/13/2022    8:01 AM 11/15/2022    8:38 AM 10/22/2022   12:18 PM  CMP  Glucose 70 - 99 mg/dL 086  578  469   BUN 6 - 20 mg/dL 14  9  11    Creatinine 0.44 - 1.00 mg/dL 6.29  5.28  4.13   Sodium 135 - 145 mmol/L 136  134  136   Potassium 3.5 - 5.1 mmol/L 4.1  3.8  3.5   Chloride 98 - 111 mmol/L 103  104  104   CO2 22 - 32 mmol/L 21  20  21    Calcium 8.9 - 10.3 mg/dL 8.8  8.6  8.5   Total Protein 6.5 - 8.1 g/dL 7.6  7.4  6.8   Total Bilirubin <1.2 mg/dL 0.5  0.4  0.4   Alkaline Phos 38 - 126 U/L 113  84  80   AST 15 - 41 U/L 30  28  29    ALT 0 - 44 U/L 21  28  31       No results found for: "CEA1", "CEA" / No results found for: "CEA1", "CEA" No results found for: "PSA1" No results found for:  "KGM010" No results found for: "CAN125"  No results found for: "TOTALPROTELP", "ALBUMINELP", "A1GS", "A2GS", "BETS", "BETA2SER", "GAMS", "MSPIKE", "SPEI" No results found for: "TIBC", "FERRITIN", "IRONPCTSAT" No results found for: "LDH"   STUDIES:   US BIOPSY (LIVER)  Result Date: 12/07/2022 INDICATION: new liver mets r/t Stage IV breast cancer possibly EXAM: ULTRASOUND GUIDED LIVER MASS BIOPSY COMPARISON:  PET-CT, 11/15/2022. CT AP, 03/28/2009. US Abdomen, 04/07/2015 MEDICATIONS: None ANESTHESIA/SEDATION: Moderate (conscious) sedation was employed during this procedure. A total of Versed 2 mg and Fentanyl 100 mcg was administered intravenously. Moderate Sedation Time: 24 minutes. The patient's level of consciousness and vital signs were monitored continuously by radiology nursing throughout the procedure under my direct supervision. COMPLICATIONS: None immediate. PROCEDURE: Informed written consent was obtained from the patient after a discussion of the risks, benefits and alternatives to treatment. The patient understands and consents the procedure. A timeout was performed prior to the initiation of the procedure. Ultrasound scanning was performed of the right upper abdominal quadrant demonstrates heterogeneously dense LEFT  hepatic lobe mass LEFT hepatic lobe mass was selected for biopsy and the procedure was planned. The right upper abdominal quadrant was prepped and draped in the usual sterile fashion. The overlying soft tissues were anesthetized with 1% lidocaine with epinephrine. A 17 gauge, 6.8 cm co-axial needle was advanced into a peripheral aspect of the lesion. This was followed by 4 core biopsies with an 18 gauge core device under direct ultrasound guidance. The coaxial needle tract was embolized with a small amount of Gel-Foam slurry and superficial hemostasis was obtained with manual compression. Post procedural scanning was negative for definitive area of hemorrhage or additional  complication. A dressing was placed. The patient tolerated the procedure well without immediate post procedural complication. IMPRESSION: Successful ultrasound guided core needle biopsy of liver mass. Roanna Banning, MD [facility] Radiology Electronically Signed   By: Roanna Banning M.D.   On: 12/07/2022 17:11   NM PET Image Restag (PS) Skull Base To Thigh  Result Date: 11/22/2022 CLINICAL DATA:  Subsequent treatment strategy for breast carcinoma. Restaging exam. Oral chemotherapy ongoing. EXAM: NUCLEAR MEDICINE PET SKULL BASE TO THIGH TECHNIQUE: 9.2 mCi F-18 FDG was injected intravenously. Full-ring PET imaging was performed from the skull base to thigh after the radiotracer. CT data was obtained and used for attenuation correction and anatomic localization. Fasting blood glucose: 104 mg/dl COMPARISON:  PET-CT 24/40/1027 FINDINGS: Mediastinal blood pool activity: SUV max 2.1 Liver activity: SUV max NA NECK: No hypermetabolic lymph nodes in the neck. Incidental CT findings: None. CHEST: Persistent intense hypermetabolic tissue within the RIGHT breast with extension to the skin surface SUV max equal 8.6 on image 73. There is new hypermetabolic skin thickening in the medial aspect of the RIGHT breast (image 72). New hypermetabolic cutaneous tissue in the lateral aspect RIGHT breast. There is new hypermetabolic glandular tissue within the LEFT breast with SUV max equal 5.3 on image 73. Incidental CT findings: No suspicious pulmonary nodules. Port in the anterior chest wall with tip in distal SVC. ABDOMEN/PELVIS: Unfortunately, there is a new lesion is within the LEFT hepatic lobe (segment 4B) which measures approximately 5 cm and has intense metabolic activity SUV max equal 7.9 on image 95. New hypermetabolic periportal lymph nodes on image 89. Incidental CT findings: None. SKELETON: There is improvement several of the previously seen hypermetabolic skeletal metastasis.  For example lesion in theLEFT sacral ala with SUV max equals 3.6 decreased from SUV max 10.2. There is new sclerotic and lytic change at this level (image 125). However, there are clearly new skeletal lesions. For example in the posterior LEFT iliac bone new lesion with SUV max equal 6.7 on image 123. New small lesion in the LEFT femoral neck is with SUV max equal 4.4 image 143. Lesion in the L4 vertebral body is similar. New skeletal lesion in the LEFT aspect of the manubrium on image 54 Incidental CT findings: None. IMPRESSION: 1. Mixed response to therapy with overall progression of disease. 2. New hypermetabolic skeletal metastasis. 3. New hypermetabolic liver metastasis. 4. New hypermetabolic periportal lymph nodes. 5. New hypermetabolic cutaneous tissue in the RIGHT breast. 6. New hypermetabolic glandular tissue in the LEFT breast. 7. Persistent hypermetabolic tissue in the RIGHT breast with extension to the skin surface. Electronically Signed   By: Genevive Bi M.D.   On: 11/22/2022 11:08

## 2022-12-13 ENCOUNTER — Inpatient Hospital Stay (HOSPITAL_BASED_OUTPATIENT_CLINIC_OR_DEPARTMENT_OTHER): Payer: 59 | Admitting: Hematology

## 2022-12-13 ENCOUNTER — Inpatient Hospital Stay: Payer: 59

## 2022-12-13 ENCOUNTER — Inpatient Hospital Stay: Payer: 59 | Attending: Hematology and Oncology

## 2022-12-13 DIAGNOSIS — Z90710 Acquired absence of both cervix and uterus: Secondary | ICD-10-CM | POA: Insufficient documentation

## 2022-12-13 DIAGNOSIS — Z1721 Progesterone receptor positive status: Secondary | ICD-10-CM | POA: Diagnosis not present

## 2022-12-13 DIAGNOSIS — M533 Sacrococcygeal disorders, not elsewhere classified: Secondary | ICD-10-CM | POA: Insufficient documentation

## 2022-12-13 DIAGNOSIS — I890 Lymphedema, not elsewhere classified: Secondary | ICD-10-CM | POA: Insufficient documentation

## 2022-12-13 DIAGNOSIS — Z5111 Encounter for antineoplastic chemotherapy: Secondary | ICD-10-CM | POA: Diagnosis present

## 2022-12-13 DIAGNOSIS — C7951 Secondary malignant neoplasm of bone: Secondary | ICD-10-CM | POA: Insufficient documentation

## 2022-12-13 DIAGNOSIS — Z8249 Family history of ischemic heart disease and other diseases of the circulatory system: Secondary | ICD-10-CM | POA: Diagnosis not present

## 2022-12-13 DIAGNOSIS — Z79899 Other long term (current) drug therapy: Secondary | ICD-10-CM | POA: Diagnosis not present

## 2022-12-13 DIAGNOSIS — C787 Secondary malignant neoplasm of liver and intrahepatic bile duct: Secondary | ICD-10-CM | POA: Insufficient documentation

## 2022-12-13 DIAGNOSIS — Z79633 Long term (current) use of mitotic inhibitor: Secondary | ICD-10-CM | POA: Diagnosis not present

## 2022-12-13 DIAGNOSIS — C50411 Malignant neoplasm of upper-outer quadrant of right female breast: Secondary | ICD-10-CM | POA: Insufficient documentation

## 2022-12-13 DIAGNOSIS — Z1722 Progesterone receptor negative status: Secondary | ICD-10-CM | POA: Diagnosis not present

## 2022-12-13 DIAGNOSIS — R234 Changes in skin texture: Secondary | ICD-10-CM | POA: Diagnosis not present

## 2022-12-13 DIAGNOSIS — Z95828 Presence of other vascular implants and grafts: Secondary | ICD-10-CM

## 2022-12-13 DIAGNOSIS — N6320 Unspecified lump in the left breast, unspecified quadrant: Secondary | ICD-10-CM | POA: Insufficient documentation

## 2022-12-13 DIAGNOSIS — Z860101 Personal history of adenomatous and serrated colon polyps: Secondary | ICD-10-CM | POA: Diagnosis not present

## 2022-12-13 DIAGNOSIS — Z170 Estrogen receptor positive status [ER+]: Secondary | ICD-10-CM

## 2022-12-13 DIAGNOSIS — F17210 Nicotine dependence, cigarettes, uncomplicated: Secondary | ICD-10-CM | POA: Diagnosis not present

## 2022-12-13 DIAGNOSIS — Z9049 Acquired absence of other specified parts of digestive tract: Secondary | ICD-10-CM | POA: Insufficient documentation

## 2022-12-13 DIAGNOSIS — Z79811 Long term (current) use of aromatase inhibitors: Secondary | ICD-10-CM | POA: Insufficient documentation

## 2022-12-13 DIAGNOSIS — Z833 Family history of diabetes mellitus: Secondary | ICD-10-CM | POA: Diagnosis not present

## 2022-12-13 LAB — COMPREHENSIVE METABOLIC PANEL
ALT: 21 U/L (ref 0–44)
AST: 30 U/L (ref 15–41)
Albumin: 3.6 g/dL (ref 3.5–5.0)
Alkaline Phosphatase: 113 U/L (ref 38–126)
Anion gap: 12 (ref 5–15)
BUN: 14 mg/dL (ref 6–20)
CO2: 21 mmol/L — ABNORMAL LOW (ref 22–32)
Calcium: 8.8 mg/dL — ABNORMAL LOW (ref 8.9–10.3)
Chloride: 103 mmol/L (ref 98–111)
Creatinine, Ser: 0.67 mg/dL (ref 0.44–1.00)
GFR, Estimated: >60 mL/min (ref >60)
Glucose, Bld: 129 mg/dL — ABNORMAL HIGH (ref 70–99)
Potassium: 4.1 mmol/L (ref 3.5–5.1)
Sodium: 136 mmol/L (ref 135–145)
Total Bilirubin: 0.5 mg/dL (ref <1.2)
Total Protein: 7.6 g/dL (ref 6.5–8.1)

## 2022-12-13 LAB — CBC WITH DIFFERENTIAL/PLATELET
Abs Immature Granulocytes: 0.35 10*3/uL — ABNORMAL HIGH (ref 0.00–0.07)
Basophils Absolute: 0.1 10*3/uL (ref 0.0–0.1)
Basophils Relative: 1 %
Eosinophils Absolute: 0.3 10*3/uL (ref 0.0–0.5)
Eosinophils Relative: 3 %
HCT: 38.3 % (ref 36.0–46.0)
Hemoglobin: 12.6 g/dL (ref 12.0–15.0)
Immature Granulocytes: 3 %
Lymphocytes Relative: 15 %
Lymphs Abs: 1.6 10*3/uL (ref 0.7–4.0)
MCH: 32.9 pg (ref 26.0–34.0)
MCHC: 32.9 g/dL (ref 30.0–36.0)
MCV: 100 fL (ref 80.0–100.0)
Monocytes Absolute: 0.7 10*3/uL (ref 0.1–1.0)
Monocytes Relative: 7 %
Neutro Abs: 7.4 10*3/uL (ref 1.7–7.7)
Neutrophils Relative %: 71 %
Platelets: 308 10*3/uL (ref 150–400)
RBC: 3.83 MIL/uL — ABNORMAL LOW (ref 3.87–5.11)
RDW: 13.9 % (ref 11.5–15.5)
WBC: 10.4 10*3/uL (ref 4.0–10.5)
nRBC: 0 % (ref 0.0–0.2)

## 2022-12-13 LAB — MAGNESIUM: Magnesium: 1.8 mg/dL (ref 1.7–2.4)

## 2022-12-13 MED ORDER — CEPHALEXIN 500 MG PO CAPS
500.0000 mg | ORAL_CAPSULE | Freq: Three times a day (TID) | ORAL | 1 refills | Status: DC
Start: 2022-12-13 — End: 2023-03-11

## 2022-12-13 MED ORDER — SODIUM CHLORIDE 0.9% FLUSH
10.0000 mL | Freq: Once | INTRAVENOUS | Status: AC
  Administered 2022-12-13: 10 mL via INTRAVENOUS

## 2022-12-13 MED ORDER — HEPARIN SOD (PORK) LOCK FLUSH 100 UNIT/ML IV SOLN
500.0000 [IU] | Freq: Once | INTRAVENOUS | Status: AC
  Administered 2022-12-13: 500 [IU] via INTRAVENOUS

## 2022-12-13 NOTE — Patient Instructions (Addendum)
[facility] Discharge Instructions   You were seen and examined today by Dr. Ellin Saba.  He discussed the results of your liver biopsy. It is weakly estrogen receptor positive and HER-2 +1. This is different from the breast biopsy, which was strongly estrogen positive.  You may stop taking anastrozole.   He discussed starting you on treatment with a chemotherapy drug and an immunotherapy drug (if you have a positive PD-L1 on your liver biopsy. We will add on that test and have the results within a week. The chemotherapy drug is called Abraxane. This regimen is given once every 3 weeks. If the PD-L1 is negative, then the option for treatment will fall back to Enhertu.   We will plan to start treatment next week.   Return as scheduled.    Thank you for choosing  Cancer Center at [facility] to provide your oncology and hematology care.  To afford each patient quality time with our provider, please arrive at least 15 minutes before your scheduled appointment time.   If you have a lab appointment with the Cancer Center please come in thru the Main Entrance and check in at the main information desk.  You need to re-schedule your appointment should you arrive 10 or more minutes late.  We strive to give you quality time with our providers, and arriving late affects you and other patients whose appointments are after yours.  Also, if you no show three or more times for appointments you may be dismissed from the clinic at the providers discretion.     Again, thank you for choosing [facility].  Our hope is that these requests will decrease the amount of time that you wait before being seen by our physicians.       _____________________________________________________________  Should you have questions after your visit to [facility], please contact our office at [phone] and follow the prompts.  Our office hours  are 8:00 a.m. and 4:30 p.m. Monday - Friday.  Please note that voicemails left after 4:00 p.m. may not be returned until the following business day.  We are closed weekends and major holidays.  You do have access to a nurse 24-7, just call the main number to the clinic [phone] and do not press any options, hold on the line and a nurse will answer the phone.    For prescription refill requests, have your pharmacy contact our office and allow 72 hours.    Due to Covid, you will need to wear a mask upon entering the hospital. If you do not have a mask, a mask will be given to you at the Main Entrance upon arrival. For doctor visits, patients may have 1 support person age 48 or older with them. For treatment visits, patients can not have anyone with them due to social distancing guidelines and our immunocompromised population.

## 2022-12-13 NOTE — Progress Notes (Signed)
Message received from A. Dareen Piano RN / Dr. Ellin Saba C1D1 held due to Dr. Ellin Saba is sending PDL1 on patient's liver biopsy. If PDL1 > than 10% they will change treatment plan.

## 2022-12-14 ENCOUNTER — Ambulatory Visit (HOSPITAL_COMMUNITY): Payer: 59 | Admitting: Physical Therapy

## 2022-12-14 DIAGNOSIS — R600 Localized edema: Secondary | ICD-10-CM

## 2022-12-14 DIAGNOSIS — I890 Lymphedema, not elsewhere classified: Secondary | ICD-10-CM | POA: Diagnosis not present

## 2022-12-14 NOTE — Therapy (Signed)
OUTPATIENT PHYSICAL THERAPY LYMPHEDEMA TREATMENT  Patient Name: Monica Soto MRN: [id number] DOB:[birth year], 57 y.o., female Today's Date: 12/14/2022  END OF SESSION:  PT End of Session - 12/14/22 1411     Visit Number 3    Number of Visits 12    Date for PT Re-Evaluation 01/04/23    Authorization Type E-[employer] OTHER [id number] 06/08/2019   [employer] OTHER [id number]    Progress Note Due on Visit 10    PT Start Time 1303    PT Stop Time 1341    PT Time Calculation (min) 38 min    Activity Tolerance Patient tolerated treatment well    Behavior During Therapy [facility] for tasks assessed/performed             Past Medical History:  Diagnosis Date   Depression    Diabetes mellitus without complication (HCC)    Erosive esophagitis 04/26/2010   EGD by Dr. Jena Gauss, small hiatal hernia   GERD (gastroesophageal reflux disease)    History of stomach ulcers    IBS (irritable bowel syndrome)    Diarrhea predominant   Microscopic colitis 04/26/2010   Colonoscopy by Dr. Jena Gauss, good response with Entocort   Tubular adenoma of colon 04/26/2010   Junction of descending and sigmoid 40 CM from anus   Past Surgical History:  Procedure Laterality Date   ABDOMINAL HYSTERECTOMY     APPENDECTOMY  2/11   Dr. Malvin Johns with a delayed closure   BREAST BIOPSY Right 06/22/2022   [place] RT BREAST BX W LOC DEV 1ST LESION IMG BX SPEC US GUIDE 06/22/2022 [facility]   PORTACATH PLACEMENT N/A 07/16/2022   Procedure: INSERTION PORT-A-CATH;  Surgeon: Manus Rudd, MD;  Location: [facility];  Service: General;  Laterality: N/A;   TONSILLECTOMY     TUBAL LIGATION     Patient Active Problem List   Diagnosis Date Noted   Genetic testing 07/12/2022   Malignant neoplasm of upper-outer quadrant of right breast in female, estrogen receptor positive (HCC) 07/03/2022   Lymphocytic colitis 05/30/2010   GERD 04/17/2010   IRRITABLE BOWEL  SYNDROME 02/14/2010   DIARRHEA 02/14/2010   Abdominal pain 02/14/2010    PCP: Dr. Magda Paganini PROVIDER: Doreatha Massed  REFERRING DIAG: [phone] (ICD-10-CM) - Malignant neoplasm of upper-outer quadrant of right female breast  THERAPY DIAG:  Lymphedema, not elsewhere classified  Localized edema  Rationale for Evaluation and Treatment: Rehabilitation  ONSET DATE: 07/03/22  SUBJECTIVE:  SUBJECTIVE STATEMENT:   PT still has not ordered her sleeve.  States that she is going to go to [employer] following this session.  She is completing her self manual daily. 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  Hypermetabolic metastatic lymph node in the posterior triangle of the right neck.  Hypermetabolic left axillary lymph nodes.  No evidence of metastatic adenopathy in the mediastinum/abdomen/pelvis.  No evidence of visceral metastasis.  Multiple skeletal metastasis: Left sacral ala 2.5 cm, left iliac bone, L4 and T12 vertebral bodies, multiple rib lesions, left scapular lesion PAIN:  Are you having pain? No  PRECAUTIONS: Other: cellulitis  WEIGHT BEARING RESTRICTIONS: No  FALLS:  Has patient fallen in last 6 months? No  LIVING ENVIRONMENT: Lives with: lives with their family OCCUPATION: not working   HAND DOMINANCE: right   PRIOR LEVEL OF FUNCTION: Independent  PATIENT GOALS: less swellin    OBJECTIVE: Note: Objective measures were completed at Evaluation unless otherwise noted.  COGNITION: Overall cognitive status: Within functional limits for tasks assessed   PALPATION: Noted induration    UPPER EXTREMITY AROM/PROM:  wfl      LYMPHEDEMA ASSESSMENTS: Pt has not had surgery to date.    LANDMARK RIGHT   eval  10 cm proximal to olecranon process 28.5  Olecranon process 26.7  10 cm proximal to ulnar styloid process 20  Just proximal to ulnar styloid process 16.8  Across hand at thumb web space 21  At base of 2nd digit 6.6  (Blank rows = not tested)  LANDMARK LEFT   eval  10 cm proximal to olecranon process 29.5  Olecranon process 27  10 cm proximal to ulnar styloid process 23  Just proximal to ulnar styloid process 19  Across hand at thumb web space 22.3  At base of 2nd digit 7.4  (Blank rows = not tested)   TODAY'S TREATMENT:                                                                                                                              DATE: 12/14/22 Manual lymph drainage for Rt UE including short neck, deep and superficial abdominal nodes, Rt axillary inguinal anastomosis as well as inter-axillary.  Posterior completed in side lying, anterior completed is supine.  Extra time spent at fingers and hand.   12/12/22 Manual lymph drainage for Rt UE including short neck, deep and superficial abdominal nodes.  Rt axillary inguinal anastomosis.  Extra time spend on fingers as most indurated.  Education on compression; need to acquire the sleeve as instructed last visit.  Discussed compression bandaging if this is not successful.   12/05/22 Educate pt on what lymphedema is and four aspects of controlling edema. Educated pt on UE exercises to include shoulder flexion, abduction, IR/ER, bicep curl, fisting and diaphragm breathing Educate on self manual for 'Rt UE  PATIENT EDUCATION:  Education details: as above  Person educated: Patient Education method: Solicitor, Actor cues, Verbal cues, and Handouts Education comprehension: verbalized understanding and returned demonstration  HOME EXERCISE PROGRAM: UE exercises and self manual.  Given measurements to obtain  sleeve and glove  ASSESSMENT:  CLINICAL IMPRESSION: PT is completing self manual as well as exercises without questions.  Pt still has not obtained a compression gauntlet or sleeve.  Therapist stated if price is an issue she could purchase an isotoner glove which would at least give her a little compression.   Pt will continue to benefit from skilled PT for decongestive techniques to improve her comfort and improve her skin integrity.    OBJECTIVE IMPAIRMENTS: decreased ROM, increased edema, and decreased skin integrity .   ACTIVITY LIMITATIONS: carrying, lifting, and dressing   PERSONAL FACTORS: 1 comorbidity: metastatic cancer  are also affecting patient's functional outcome.   REHAB POTENTIAL: Good  CLINICAL DECISION MAKING: Evolving/moderate complexity  EVALUATION COMPLEXITY: Moderate  GOALS: Goals reviewed with patient? No  SHORT TERM GOALS: Target date: 12/19/22  PT measurements from elbow to hand to be decreased 1 cm to decrease full feeling  Baseline: Goal status: on-going   2.  Pt to be confident in self manual techniques. Baseline:  Goal status: on-going  LONG TERM GOALS: Target date: 01/03/23  Pt to have and be able to don and doff compression garments. Baseline:  Goal status: on-going  2.  Pt to have and be using a compression pump to assist in decreasing edema in Rt UE  Baseline:  Goal status: on-going   PLAN:  PT FREQUENCY: 3x/week  PT DURATION: 4 weeks  PLANNED INTERVENTIONS: 97110-Therapeutic exercises, 97530- Therapeutic activity, [id number]- Neuromuscular re-education, 97535- Self Care, and 27253- Manual therapy  PLAN FOR NEXT SESSION:  Measure on Wed.   Virgina Organ, PT CLT [phone]   12/14/2022, 2:12 PM

## 2022-12-17 ENCOUNTER — Other Ambulatory Visit: Payer: Self-pay

## 2022-12-17 ENCOUNTER — Ambulatory Visit (HOSPITAL_COMMUNITY): Payer: 59 | Admitting: Physical Therapy

## 2022-12-17 DIAGNOSIS — I890 Lymphedema, not elsewhere classified: Secondary | ICD-10-CM | POA: Diagnosis not present

## 2022-12-17 DIAGNOSIS — R600 Localized edema: Secondary | ICD-10-CM

## 2022-12-17 NOTE — Therapy (Signed)
OUTPATIENT PHYSICAL THERAPY LYMPHEDEMA TREATMENT  Patient Name: Monica Soto MRN: [id number] DOB:04-25-1965, 57 y.o., female Today's Date: 12/17/2022  END OF SESSION:  PT End of Session - 12/17/22 1655     Visit Number 4    Number of Visits 12    Date for PT Re-Evaluation 01/04/23    Authorization Type E-[employer] OTHER [id number] 06/08/2019   E-[employer] OTHER [id number]    Progress Note Due on Visit 10    PT Start Time 1400    PT Stop Time 1439    PT Time Calculation (min) 39 min    Activity Tolerance Patient tolerated treatment well    Behavior During Therapy [facility] for tasks assessed/performed             Past Medical History:  Diagnosis Date   Depression    Diabetes mellitus without complication (HCC)    Erosive esophagitis 04/26/2010   EGD by Dr. Jena Gauss, small hiatal hernia   GERD (gastroesophageal reflux disease)    History of stomach ulcers    IBS (irritable bowel syndrome)    Diarrhea predominant   Microscopic colitis 04/26/2010   Colonoscopy by Dr. Jena Gauss, good response with Entocort   Tubular adenoma of colon 04/26/2010   Junction of descending and sigmoid 40 CM from anus   Past Surgical History:  Procedure Laterality Date   ABDOMINAL HYSTERECTOMY     APPENDECTOMY  2/11   Dr. Malvin Johns with a delayed closure   BREAST BIOPSY Right 06/22/2022   [place] RT BREAST BX W LOC DEV 1ST LESION IMG BX SPEC US GUIDE 06/22/2022 [facility]MOGRAP[facility]   PORTACATH PLACEMENT N/A 07/16/2022   Procedure: INSERTION PORT-A-CATH;  Surgeon: Manus Rudd, MD;  Location: [facility];  Service: General;  Laterality: N/A;   TONSILLECTOMY     TUBAL LIGATION     Patient Active Problem List   Diagnosis Date Noted   Genetic testing 07/12/2022   Malignant neoplasm of upper-outer quadrant of right breast in female, estrogen receptor positive (HCC) 07/03/2022   Lymphocytic colitis 05/30/2010   GERD 04/17/2010   IRRITABLE BOWEL  SYNDROME 02/14/2010   DIARRHEA 02/14/2010   Abdominal pain 02/14/2010    PCP: Dr. Magda Paganini PROVIDER: Doreatha Massed  REFERRING DIAG: [phone] (ICD-10-CM) - Malignant neoplasm of upper-outer quadrant of right female breast  THERAPY DIAG:  Lymphedema, not elsewhere classified  Localized edema  Rationale for Evaluation and Treatment: Rehabilitation  ONSET DATE: 07/03/22  SUBJECTIVE:  SUBJECTIVE STATEMENT:   PT reports she did not get to start her chemo Friday as the biopsy showed something else they want to try instead.   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  Hypermetabolic metastatic lymph node in the posterior triangle of the right neck.  Hypermetabolic left axillary lymph nodes.  No evidence of metastatic adenopathy in the mediastinum/abdomen/pelvis.  No evidence of visceral metastasis.  Multiple skeletal metastasis: Left sacral ala 2.5 cm, left iliac bone, L4 and T12 vertebral bodies, multiple rib lesions, left scapular lesion PAIN:  Are you having pain? No  PRECAUTIONS: Other: cellulitis  WEIGHT BEARING RESTRICTIONS: No  FALLS:  Has patient fallen in last 6 months? No  LIVING ENVIRONMENT: Lives with: lives with their family OCCUPATION: not working   HAND DOMINANCE: right   PRIOR LEVEL OF FUNCTION: Independent  PATIENT GOALS: less swellin    OBJECTIVE: Note: Objective measures were completed at Evaluation unless otherwise noted.  COGNITION: Overall cognitive status: Within functional limits for tasks assessed   PALPATION: Noted induration    UPPER EXTREMITY AROM/PROM: wfl      LYMPHEDEMA ASSESSMENTS: Pt has not had surgery  to date.    LANDMARK left eval  10 cm proximal to olecranon process 28.5  Olecranon process 26.7  10 cm proximal to ulnar styloid process 20  Just proximal to ulnar styloid process 16.8  Across hand at thumb web space 21  At base of 2nd digit 6.6  (Blank rows = not tested)  LANDMARK right  eval  10 cm proximal to olecranon process 29.5  Olecranon process 27  10 cm proximal to ulnar styloid process 23  Just proximal to ulnar styloid process 19  Across hand at thumb web space 22.3  At base of 2nd digit 7.4  (Blank rows = not tested)   TODAY'S TREATMENT:                                                                                                                              DATE:  12/17/22 Manual lymph drainage for Rt UE including short neck, deep and superficial abdominal nodes, Rt axillary inguinal anastomosis as well as intra-axillary.   Extra time spent on fingers and hand.   12/14/22 Manual lymph drainage for Rt UE including short neck, deep and superficial abdominal nodes, Rt axillary inguinal anastomosis as well as inter-axillary.  Posterior completed in side lying, anterior completed is supine.  Extra time spent at fingers and hand.   12/12/22 Manual lymph drainage for Rt UE including short neck, deep and superficial abdominal nodes.  Rt axillary inguinal anastomosis.  Extra time spend on fingers as most indurated.  Education on compression; need to acquire the sleeve as instructed last visit.  Discussed compression bandaging if this is not successful.  12/05/22 Educate pt on what lymphedema is and four aspects of  controlling edema. Educated pt on UE exercises to include shoulder flexion, abduction, IR/ER, bicep curl, fisting and diaphragm breathing Educate on self manual for 'Rt UE    PATIENT EDUCATION:  Education details: as above  Person educated: Patient Education method: Programmer, multimedia, Demonstration, Tactile cues, Verbal cues, and Handouts Education comprehension:  verbalized understanding and returned demonstration  HOME EXERCISE PROGRAM: UE exercises and self manual.  Given measurements to obtain sleeve and glove  ASSESSMENT:  CLINICAL IMPRESSION: Continued with manual for Rt UE.  Pt did not wear a sleeve or gauntlet to session today and unsure if she had ordered it yet.  Still has good amount of induration and edema in Rt UE, especially distally.  Pt will continue to benefit from skilled PT for decongestive techniques to improve her comfort and improve her skin integrity.    OBJECTIVE IMPAIRMENTS: decreased ROM, increased edema, and decreased skin integrity .   ACTIVITY LIMITATIONS: carrying, lifting, and dressing   PERSONAL FACTORS: 1 comorbidity: metastatic cancer  are also affecting patient's functional outcome.   REHAB POTENTIAL: Good  CLINICAL DECISION MAKING: Evolving/moderate complexity  EVALUATION COMPLEXITY: Moderate  GOALS: Goals reviewed with patient? No  SHORT TERM GOALS: Target date: 12/19/22  PT measurements from elbow to hand to be decreased 1 cm to decrease full feeling  Baseline: Goal status: on-going   2.  Pt to be confident in self manual techniques. Baseline:  Goal status: on-going  LONG TERM GOALS: Target date: 01/03/23  Pt to have and be able to don and doff compression garments. Baseline:  Goal status: on-going  2.  Pt to have and be using a compression pump to assist in decreasing edema in Rt UE  Baseline:  Goal status: on-going   PLAN:  PT FREQUENCY: 3x/week  PT DURATION: 4 weeks  PLANNED INTERVENTIONS: 97110-Therapeutic exercises, 97530- Therapeutic activity, [id number]- Neuromuscular re-education, 97535- Self Care, and 52841- Manual therapy  PLAN FOR NEXT SESSION: Measure on Wednesday.  Lurena Nida, PTA/CLT [facility] Ph: [phone]    12/17/2022, 4:56 PM

## 2022-12-19 ENCOUNTER — Ambulatory Visit (HOSPITAL_COMMUNITY): Payer: 59 | Admitting: Physical Therapy

## 2022-12-19 DIAGNOSIS — I890 Lymphedema, not elsewhere classified: Secondary | ICD-10-CM | POA: Diagnosis not present

## 2022-12-19 DIAGNOSIS — Z170 Estrogen receptor positive status [ER+]: Secondary | ICD-10-CM

## 2022-12-19 DIAGNOSIS — R600 Localized edema: Secondary | ICD-10-CM

## 2022-12-19 DIAGNOSIS — C50411 Malignant neoplasm of upper-outer quadrant of right female breast: Secondary | ICD-10-CM

## 2022-12-19 DIAGNOSIS — R293 Abnormal posture: Secondary | ICD-10-CM

## 2022-12-19 NOTE — Therapy (Signed)
OUTPATIENT PHYSICAL THERAPY LYMPHEDEMA TREATMENT  Patient Name: Monica Soto MRN: [id number] DOB:[birth year], 57 y.o., female Today's Date: 12/19/2022  END OF SESSION:  PT End of Session - 12/19/22 1649     Visit Number 5    Number of Visits 12    Date for PT Re-Evaluation 01/04/23    Authorization Type E-[employer] OTHER [id number] 06/08/2019   E-[employer] OTHER [id number]    Progress Note Due on Visit 10    PT Start Time 1348    PT Stop Time 1440    PT Time Calculation (min) 52 min    Activity Tolerance Patient tolerated treatment well    Behavior During Therapy [facility] for tasks assessed/performed             Past Medical History:  Diagnosis Date   Depression    Diabetes mellitus without complication (HCC)    Erosive esophagitis 04/26/2010   EGD by Dr. Jena Gauss, small hiatal hernia   GERD (gastroesophageal reflux disease)    History of stomach ulcers    IBS (irritable bowel syndrome)    Diarrhea predominant   Microscopic colitis 04/26/2010   Colonoscopy by Dr. Jena Gauss, good response with Entocort   Tubular adenoma of colon 04/26/2010   Junction of descending and sigmoid 40 CM from anus   Past Surgical History:  Procedure Laterality Date   ABDOMINAL HYSTERECTOMY     APPENDECTOMY  2/11   Dr. Malvin Johns with a delayed closure   BREAST BIOPSY Right 06/22/2022   [place] RT BREAST BX W LOC DEV 1ST LESION IMG BX SPEC US GUIDE 06/22/2022 [facility]MO[facility]   PORTACATH PLACEMENT N/A 07/16/2022   Procedure: INSERTION PORT-A-CATH;  Surgeon: Manus Rudd, MD;  Location: [facility];  Service: General;  Laterality: N/A;   TONSILLECTOMY     TUBAL LIGATION     Patient Active Problem List   Diagnosis Date Noted   Genetic testing 07/12/2022   Malignant neoplasm of upper-outer quadrant of right breast in female, estrogen receptor positive (HCC) 07/03/2022   Lymphocytic colitis 05/30/2010   GERD 04/17/2010   IRRITABLE BOWEL  SYNDROME 02/14/2010   DIARRHEA 02/14/2010   Abdominal pain 02/14/2010    PCP: Dr. Phillips Odor  REFERRING PROVIDER: Doreatha Massed  REFERRING DIAG: [phone] (ICD-10-CM) - Malignant neoplasm of upper-outer quadrant of right female breast  THERAPY DIAG:  Lymphedema, not elsewhere classified  Localized edema  Malignant neoplasm of upper-outer quadrant of right breast in female, estrogen receptor positive (HCC)  Abnormal posture  Rationale for Evaluation and Treatment: Rehabilitation  ONSET DATE: 07/03/22  SUBJECTIVE:  SUBJECTIVE STATEMENT:   PT reports she did not get to start her chemo Friday as the biopsy showed something else they want to try instead.   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  Hypermetabolic metastatic lymph node in the posterior triangle of the right neck.  Hypermetabolic left axillary lymph nodes.  No evidence of metastatic adenopathy in the mediastinum/abdomen/pelvis.  No evidence of visceral metastasis.  Multiple skeletal metastasis: Left sacral ala 2.5 cm, left iliac bone, L4 and T12 vertebral bodies, multiple rib lesions, left scapular lesion PAIN:  Are you having pain? No  PRECAUTIONS: Other: cellulitis  WEIGHT BEARING RESTRICTIONS: No  FALLS:  Has patient fallen in last 6 months? No  LIVING ENVIRONMENT: Lives with: lives with their family OCCUPATION: not working   HAND DOMINANCE: right   PRIOR LEVEL OF FUNCTION: Independent  PATIENT GOALS: less swellin    OBJECTIVE: Note: Objective measures were completed at Evaluation unless otherwise noted.  COGNITION: Overall cognitive status: Within functional limits for tasks  assessed   PALPATION: Noted induration    UPPER EXTREMITY AROM/PROM: wfl      LYMPHEDEMA ASSESSMENTS: Pt has not had surgery to date.    LANDMARK left eval  10 cm proximal to olecranon process 28.5  Olecranon process 26.7  10 cm proximal to ulnar styloid process 20  Just proximal to ulnar styloid process 16.8  Across hand at thumb web space 21  At base of 2nd digit 6.6  (Blank rows = not tested)  LANDMARK right  eval Right 12/19/22  10 cm proximal to olecranon process 29.5 29  Olecranon process 27 27.2  10 cm proximal to ulnar styloid process 23 24.3  Just proximal to ulnar styloid process 19 19  Across hand at thumb web space 22.3 21.5  At base of 2nd digit 7.4 7  At base of 3rd digit  7  At base of ring finger  6.7  At base of pinky finger  6  (Blank rows = not tested)   TODAY'S TREATMENT:                                                                                                                              DATE:  12/19/22 Measurements (see above) Compression discussion/reidsleeve for night use Manual lymph drainage for Rt UE including short neck, deep and superficial abdominal nodes, Rt axillary inguinal anastomosis as well as intra-axillary.   Extra time spent on fingers and hand.   12/17/22 Manual lymph drainage for Rt UE including short neck, deep and superficial abdominal nodes, Rt axillary inguinal anastomosis as well as intra-axillary.   Extra time spent on fingers and hand.   12/14/22 Manual lymph drainage for Rt UE including short neck, deep and superficial abdominal nodes, Rt axillary inguinal anastomosis as well as inter-axillary.  Posterior completed in side lying, anterior completed is  supine.  Extra time spent at fingers and hand.   12/12/22 Manual lymph drainage for Rt UE including short neck, deep and superficial abdominal nodes.  Rt axillary inguinal anastomosis.  Extra time spend on fingers as most indurated.  Education on compression; need to  acquire the sleeve as instructed last visit.  Discussed compression bandaging if this is not successful.  12/05/22 Educate pt on what lymphedema is and four aspects of controlling edema. Educated pt on UE exercises to include shoulder flexion, abduction, IR/ER, bicep curl, fisting and diaphragm breathing Educate on self manual for 'Rt UE    PATIENT EDUCATION:  Education details: as above  Person educated: Patient Education method: Programmer, multimedia, Demonstration, Tactile cues, Verbal cues, and Handouts Education comprehension: verbalized understanding and returned demonstration  HOME EXERCISE PROGRAM: UE exercises and self manual.  Given measurements to obtain sleeve and glove  ASSESSMENT:  CLINICAL IMPRESSION: Rt UE measured today with minimal change from evaluation X 2 weeks prior.   Pt has been completing self manual and exercises given in addition to treatment here with edema/induration still present.  States she received call regarding pump and hopes to have that soon to assist with fluid reduction.  Educated on bandaging but pt wishes not to do this at this time.  Instructed to call asap to [facility] and get sleeve ordered also.  Educated on importance of using the compression along with the manual and exercises. Pt given donated reidsleeve to use at night to see if this additionally with assist with induration and edema in Rt UE.  Returns to [place] this week to discuss treatment and urged to show the areas on her breast and bumps along her abdomen.  Chemo should start this Friday per patient and unsure on the radiation at this point.  Pt will continue to benefit from skilled PT for decongestive techniques to improve her comfort and improve her skin integrity.    OBJECTIVE IMPAIRMENTS: decreased ROM, increased edema, and decreased skin integrity .   ACTIVITY LIMITATIONS: carrying, lifting, and dressing   PERSONAL FACTORS: 1 comorbidity: metastatic cancer  are also affecting patient's functional  outcome.   REHAB POTENTIAL: Good  CLINICAL DECISION MAKING: Evolving/moderate complexity  EVALUATION COMPLEXITY: Moderate  GOALS: Goals reviewed with patient? No  SHORT TERM GOALS: Target date: 12/19/22  PT measurements from elbow to hand to be decreased 1 cm to decrease full feeling  Baseline: Goal status: on-going   2.  Pt to be confident in self manual techniques. Baseline:  Goal status: on-going  LONG TERM GOALS: Target date: 01/03/23  Pt to have and be able to don and doff compression garments. Baseline:  Goal status: on-going  2.  Pt to have and be using a compression pump to assist in decreasing edema in Rt UE  Baseline:  Goal status: on-going   PLAN:  PT FREQUENCY: 3x/week  PT DURATION: 4 weeks  PLANNED INTERVENTIONS: 97110-Therapeutic exercises, 97530- Therapeutic activity, [id number]- Neuromuscular re-education, 97535- Self Care, and 82956- Manual therapy  PLAN FOR NEXT SESSION: Measure on Wednesdays.  Continue with manual.  Lurena Nida, PTA/CLT [facility] Ph: [phone]    12/19/2022, 4:49 PM

## 2022-12-20 ENCOUNTER — Encounter (HOSPITAL_COMMUNITY): Payer: Self-pay | Admitting: Hematology

## 2022-12-20 ENCOUNTER — Inpatient Hospital Stay: Payer: 59

## 2022-12-20 ENCOUNTER — Other Ambulatory Visit (HOSPITAL_COMMUNITY): Payer: Self-pay | Admitting: Hematology

## 2022-12-20 DIAGNOSIS — Z170 Estrogen receptor positive status [ER+]: Secondary | ICD-10-CM

## 2022-12-20 LAB — MOLECULAR PATHOLOGY

## 2022-12-20 NOTE — Patient Instructions (Addendum)
[facility] Chemotherapy Teaching   You have been diagnosed with Stage IV Breast Cancer.  You will be treated with the chemotherapy drug called Abraxane on Days 1 and Day 8 every 21 days.  This is considered one cycle of treatment.   The intent of treatment is palliative to keep the cancer under control.    You will see the doctor regularly throughout treatment.  We will obtain blood work from you prior to every treatment and monitor your results to make sure it is safe to give your treatment. The doctor monitors your response to treatment by the way you are feeling, your blood work, and by obtaining scans periodically.  There will be wait times while you are here for treatment.  It will take about 30 minutes to 1 hour for your lab work to result.  Then there will be wait times while pharmacy mixes your medications.     Nab-paclitaxel (protein-bound) (Abraxane)  About This Drug Nab-paclitaxel is used to treat cancer. It is given in the vein (IV).  It takes 30 minutes to infuse.   Possible Side Effects  Bone marrow suppression. This is a decrease in the number of white blood cells, red blood cells, and platelets. This may raise your risk of infection, make you tired and weak (fatigue), and raise your risk of bleeding.   Abnormal heart beat, blood pressure, and/or abnormal EKG (electrocardiogram)   Nausea and vomiting (throwing up)   Diarrhea (loose bowel movements)   Tiredness, weakness   Swelling of your legs, ankles and/or feet   Fever   Changes in your liver function   Infection   Dehydration (lack of water in the body from losing too much fluid)   Decreased appetite (decreased hunger)   Joint, bone and muscle pain   Rash   Effects on the nerves are called peripheral neuropathy. You may feel numbness, tingling, or pain in your hands and feet. It may be hard for you to button your clothes, open jars, or walk as usual. The effect on the nerves may get worse with  more doses of the drug. These effects get better in some people after the drug is stopped but it does not get better in all people.   Hair loss. Hair loss is often temporary, although with certain medicine, hair loss can sometimes be permanent. Hair loss may happen suddenly or gradually. If you lose hair, you may lose it from your head, face, armpits, pubic area, chest, and/or legs. You may also notice your hair getting thin.  Note: Each of the side effects above was reported in 20% or greater of patients treated with nabpaclitaxel. Not all possible side effects are included above.  Warnings and Precautions  Severe bone marrow suppression   Inflammation (swelling) of the lungs. You may have a dry cough or trouble breathing.  Severe infections which can be life-threatening   Severe peripheral neuropathy   Allergic reactions, including anaphylaxis are rare but may happen in some patients, which can be life-threatening. Signs of allergic reaction to this drug may be swelling of the face, feeling like your tongue or throat are swelling, trouble breathing, rash, itching, fever, chills, feeling dizzy, and/or feeling that your heart is beating in a fast or not normal way. If this happens, do not take another dose of this drug. You should get urgent medical treatment.   This drug contains albumin, which is a protein from donated human blood. There is a rare risk of  transmission of viral diseases.  Note: Some of the side effects above are very rare. If you have concerns and/or questions, please discuss them with your medical team.  Important Information  This drug may be present in the saliva, tears, sweat, urine, stool, vomit, semen, and vaginal secretions. Talk to your doctor and/or your nurse about the necessary precautions to take during this time.  Treating Side Effects  To decrease the risk of infection, wash your hands regularly.   Avoid close contact with people who have a cold, the flu, or  other infections.   Take your temperature as your doctor or nurse tells you, and whenever you feel like you may have a fever.   To help decrease the risk of bleeding, use a soft toothbrush. Check with your nurse before using dental floss.   Be very careful when using knives or tools.   Use an electric shaver instead of a razor.   Manage tiredness by pacing your activities for the day. Be sure to include periods of rest between energy-draining activities.   Drink plenty of fluids (a minimum of eight glasses per day is recommended).   To help with decreased appetite, eat small, frequent meals. Eat foods high in calories and protein, such as meat, poultry, fish, dry beans, tofu, eggs, nuts, milk, yogurt, cheese, ice cream, pudding, and nutritional supplements.   Consider using sauces and spices to increase taste. Daily exercise, with your doctor's approval, may increase your appetite.   If you throw up or have loose bowel movements, you should drink more fluids so that you do not become dehydrated (lack of water in the body from losing too much fluid).   To help with nausea and vomiting, eat small, frequent meals instead of three large meals a day. Choose foods and drinks that are at room temperature. Ask your nurse or doctor about other helpful tips and medicine that is available to help stop or lessen these symptoms.   If you have diarrhea, eat low-fiber foods that are high in protein and calories and avoid foods that can irritate your digestive tracts or lead to cramping.   Ask your nurse or doctor about medicine that can lessen or stop your diarrhea.   To help with hair loss, wash hair with a mild shampoo and avoid washing your hair every day.   Avoid rubbing your scalp, pat your hair or scalp dry.   Avoid coloring your hair.   Limit your use of hair spray, electric curlers, blow dryers, and curling irons.   If you are interested in getting a wig, talk to your nurse. You  can also call the [employer] at 800-ACS-2345 to find out information about the "Look Good, Feel Better" program closeto where you live. It is a free program where women getting chemotherapy can learn about wigs, turbans and scarves as well as makeup techniques and skin and nail care.   Keeping your pain under control is important to your well-being. Please tell your doctor or nurse if you are experiencing pain.   If you get a rash do not put anything on it unless your doctor or nurse says you may. Keep the area around the rash clean and dry. Ask your doctor for medicine if your rash bothers you.   If you have numbness and tingling in your hands and feet, be careful when cooking, walking, and handling sharp objects and hot liquids.  Food and Drug Interactions  There are no known interactions of  nab-paclitaxel of with food.   This drug may interact with other medicines. Tell your doctor and pharmacist about all the prescription and over-the-counter medicines and dietary supplements (vitamins, minerals, herbs and others) that you are taking at this time. Also, check with your doctor or pharmacist before starting any new prescription or over-the-counter medicines, or dietary supplements to make sure that there are no interactions.  When to Call the Doctor Call your doctor or nurse if you have any of these symptoms and/or any new or unusual symptoms:   Fever of 100.4 F (38 C) or higher   Chills   Signs of a local infection such as pain, redness, tenderness, warmth and/or swelling   Easy bleeding or bruising   Wheezing or trouble breathing   Tiredness that interferes with your daily activities   Pain in your chest   Dry cough   Feeling dizzy or lightheaded   Feeling that your heart is beating in a fast or not normal way (palpitations)   Diarrhea, 4 times in one day or diarrhea with weakness or lightheadedness   Nausea that stops you from eating or drinking and/or that  isn't relieved by prescribed medicines   Throwing up   Lasting loss of appetite or rapid weight loss of five pounds in a week   Pain that does not go away or is not relieved by prescribed medicine   Numbness, tingling, or pain in your hands and feet   Weight gain of 5 pounds in one week (fluid retention)   Swelling of your legs, ankles and/or feet   Signs of liver problems: dark urine, pale bowel movements, bad stomach pain, feeling very tired and weak, unusual itching, or yellowing of the eyes or skin   Signs of allergic reaction: swelling of the face, feeling like your tongue or throat are swelling, trouble breathing, rash, itching, fever, chills, feeling dizzy, and/or feeling that your heart is beating in a fast or not normal way. If this happens, call 911 for emergency care.   New rash and/or itching   Rash that is not relieved by prescribed medicines   If you think you are pregnant or have impregnated your partner  Reproduction Warnings  Pregnancy warning: This drug can have harmful effects on the unborn baby. Women of childbearing potential should use effective methods of birth control during your cancer treatment and for at least 6 months after treatment. Men with female partners of childbearing potential should use effective methods of birth control during your cancer treatment and for at least 3 months after your cancer treatment. Let your doctor know right away if you think you may be pregnant or may have impregnated your partner.   Breastfeeding warning: It is not known if this drug passes into breast milk. For this reason, women should not breastfeed during treatment and for 2 weeks after treatment because this drug could enter the breast milk and cause harm to a breastfeeding baby.   Fertility Warning: In men and women both, this drug may affect your ability to have children in the future. Talk with your doctor or nurse if you plan to have children. Ask for information on  sperm or egg banking.  Gemcitabine (Gemzar)  About This Drug Gemcitabine is used to treat cancer. It is given in the vein (IV).  It will take 30 minutes to infuse.   Possible Side Effects  Bone marrow suppression. This is a decrease in the number of white blood cells, red blood cells, and  platelets. This may raise your risk of infection, make you tired and weak (fatigue), and raise your risk of bleeding   Fever   Trouble breathing   Nausea and throwing up (vomiting)   Changes in your liver function   Increased protein in your urine, which can affect how your kidneys work   Blood in your urine   Rash   Swelling of your legs, ankles and/or feet  Note: Each of the side effects above was reported in 20% or greater of patients treated with Gemcitabine. Not all possible side effects are included above.  Warnings and Precautions  Severe bone marrow suppression   Inflammation (swelling) of the lungs and/or thickening of the lung tissues, which may be lifethreatening. You may have a dry cough or trouble breathing.   Changes in your kidney function, which can cause kidney failure   Changes in your liver function, which can cause liver failure and may be life-threatening   If you have received radiation treatments, your skin may become red and/or you may develop soreness of the mouth and throat after gemcitabine. This reaction is called "recall." Your body is recalling, or remembering, that it had radiation therapy.   A syndrome where fluid from your veins can leak into your tissues and cause a decrease in your blood pressure and fluid to accumulate in your tissues and/or lungs.   A syndrome can occur that causes changes to kidney and liver function in combination with a decrease in red blood cells. Kidney failure may result which may be life-threatening.   Changes in your central nervous system can happen. The central nervous system is made up of your brain and spinal cord. You  could feel extreme tiredness, agitation, confusion, hallucinations (see or hear things that are not there), have trouble understanding or speaking, loss of control of your bowels or bladder, eyesight changes, numbness or lack of strength to your arms, legs, face, or body, seizures or coma. If you start to have any of these symptoms let your doctor know right away.  Note: Some of the side effects above are very rare. If you have concerns and/or questions, please discuss them with your medical team.  Important Information  This drug may be present in the saliva, tears, sweat, urine, stool, vomit, semen, and vaginal secretions. Talk to your doctor and/or your nurse about the necessary precautions to take during this time.  Treating Side Effects  Manage tiredness by pacing your activities for the day.   Be sure to include periods of rest between energy-draining activities.   To decrease the risk of infection, wash your hands regularly.   Avoid close contact with people who have a cold, the flu, or other infections.   Take your temperature as your doctor or nurse tells you, and whenever you feel like you may have a fever.   To help decrease bleeding, use a soft toothbrush. Check with your nurse before using dental floss.   Be very careful when using knives or tools.   Use an electric shaver instead of a razor.   Drink plenty of fluids (a minimum of eight glasses per day is recommended).   If you throw up or have loose bowel movements, you should drink more fluids so that you do not become dehydrated (lack of water in the body from losing too much fluid).   To help with nausea and vomiting, eat small, frequent meals instead of three large meals a day. Choose foods and drinks that are  at room temperature. Ask your nurse or doctor about other helpful tips and medicine that is available to help stop or lessen these symptoms.   If you get a rash do not put anything on it unless your doctor or  nurse says you may. Keep the area around the rash clean and dry. Ask your doctor for medicine if your rash bothers you.   If you received radiation, and your skin becomes red or irritated again, or you develop soreness of the mouth and throat, follow the same care instructions you did during radiation treatment. Be sure to tell the nurse or doctor administering your chemotherapy about your skin changes.  Food and Drug Interactions  There are no known interactions of gemcitabine with food.   This drug may interact with other medicines. Tell your doctor and pharmacist about all the prescription and over-the-counter medicines and dietary supplements (vitamins, minerals, herbs and others) that you are taking at this time. Also, check with your doctor or pharmacist before starting any new prescription or over-the-counter medicines, or dietary supplements to make sure that there are no interactions.  When to Call the Doctor Call your doctor or nurse if you have any of these symptoms and/or any new or unusual symptoms:   Fever of 100.4 F (38 C) or higher   Chills   Tiredness that interferes with your daily activities   Feeling dizzy or lightheaded   Pain in your chest   Dry cough   Wheezing and/or trouble breathing   Confusion and/or agitation   Symptoms of a seizure such as confusion, blacking out, passing out, loss of hearing or vision, blurred vision, unusual smells or tastes (such as burning rubber), trouble talking, tremors or shaking in parts or all of the body, repeated body movements, tense muscles that do not relax, and loss of control of urine and bowels. If you or your family member suspects you are having a seizure, call 911 right away.   Hallucinations   Trouble understanding or speaking   Blurry vision or changes in your eyesight   Numbness or lack of strength to your arms, legs, face, or body   Easy bleeding or bruising   Nausea that stops you from eating or drinking  and/or is not relieved by prescribed medicines   Throwing up   Swelling of legs, ankles, or feet   Weight gain of 5 pounds in one week (fluid retention)   Blood in urine   Decreased urine or very dark urine   Foamy or bubbly-looking urine   A new rash/itching or a rash that is not relieved by prescribed medicines   Signs of possible liver problems: dark urine, pale bowel movements, bad stomach pain, feeling very tired and weak, unusual itching, or yellowing of the eyes or skin    If you think you may be pregnant or may have impregnated your partner  Reproduction Warnings   Pregnancy warning: This drug can have harmful effects on the unborn baby. Women of childbearing potential should use effective methods of birth control during your cancer treatment and for 6 months after treatment. Men with female partners of childbearing potential should use effective methods of birth control during your cancer treatment and for 3 months after your cancer treatment. Let your doctor know right away if you think you may be pregnant or may have impregnated your partner.   Breastfeeding warning: Women should not breastfeed during treatment and for 1 week after treatment because this drug could enter the  breast milk and cause harm to a breastfeeding baby.   Fertility warning: In men, this drug may affect your ability to have children in the future. Talk with your doctor or nurse if you plan to have children. Ask for information on sperm banking.  SELF CARE ACTIVITIES WHILE RECEIVING CHEMOTHERAPY:  Hydration Increase your fluid intake 48 hours prior to treatment and drink at least 8 to 12 cups (64 ounces) of water/decaffeinated beverages per day after treatment. You can still have your cup of coffee or soda but these beverages do not count as part of your 8 to 12 cups that you need to drink daily. No alcohol intake.  Medications Continue taking your normal prescription medication as prescribed.  If you  start any new herbal or new supplements please let us know first to make sure it is safe.  Mouth Care Have teeth cleaned professionally before starting treatment. Keep dentures and partial plates clean. Use soft toothbrush and do not use mouthwashes that contain alcohol. Biotene is a good mouthwash that is available at most pharmacies or may be ordered by calling [phone]. Use warm salt water gargles (1 teaspoon salt per 1 quart warm water) before and after meals and at bedtime. If you need dental work, please let the doctor know before you go for your appointment so that we can coordinate the best possible time for you in regards to your chemo regimen. You need to also let your dentist know that you are actively taking chemo. We may need to do labs prior to your dental appointment.  Skin Care Always use sunscreen that has not expired and with SPF (Sun Protection Factor) of 50 or higher. Wear hats to protect your head from the sun. Remember to use sunscreen on your hands, ears, face, & feet.  Use good moisturizing lotions such as udder cream, eucerin, or even Vaseline. Some chemotherapies can cause dry skin, color changes in your skin and nails.    Avoid long, hot showers or baths. Use gentle, fragrance-free soaps and laundry detergent. Use moisturizers, preferably creams or ointments rather than lotions because the thicker consistency is better at preventing skin dehydration. Apply the cream or ointment within 15 minutes of showering. Reapply moisturizer at night, and moisturize your hands every time after you wash them.  Hair Loss (if your doctor says your hair will fall out)  If your doctor says that your hair is likely to fall out, decide before you begin chemo whether you want to wear a wig. You may want to shop before treatment to match your hair color. Hats, turbans, and scarves can also camouflage hair loss, although some people prefer to leave their heads uncovered. If you go  bare-headed outdoors, be sure to use sunscreen on your scalp. Cut your hair short. It eases the inconvenience of shedding lots of hair, but it also can reduce the emotional impact of watching your hair fall out. Don't perm or color your hair during chemotherapy. Those chemical treatments are already damaging to hair and can enhance hair loss. Once your chemo treatments are done and your hair has grown back, it's OK to resume dyeing or perming hair.  With chemotherapy, hair loss is almost always temporary. But when it grows back, it may be a different color or texture. In older adults who still had hair color before chemotherapy, the new growth may be completely gray.  Often, new hair is very fine and soft.  Infection Prevention Please wash your hands for at  least 30 seconds using warm soapy water. Handwashing is the #1 way to prevent the spread of germs. Stay away from sick people or people who are getting over a cold. If you develop respiratory systems such as green/yellow mucus production or productive cough or persistent cough let us know and we will see if you need an antibiotic. It is a good idea to keep a pair of gloves on when going into grocery stores/[employer] to decrease your risk of coming into contact with germs on the carts, etc. Carry alcohol hand gel with you at all times and use it frequently if out in public. If your temperature reaches 100.5 or higher please call the clinic and let us know.  If it is after hours or on the weekend please go to the ER if your temperature is over 100.5.  Please have your own personal thermometer at home to use.    Sex and bodily fluids If you are going to have sex, a condom must be used to protect the person that isn't taking chemotherapy. Chemo can decrease your libido (sex drive). For a few days after chemotherapy, chemotherapy can be excreted through your bodily fluids.  When using the toilet please close the lid and flush the toilet twice.  Do this for a  few day after you have had chemotherapy.   Effects of chemotherapy on your sex life Some changes are simple and won't last long. They won't affect your sex life permanently.  Sometimes you may feel: too tired not strong enough to be very active sick or sore  not in the mood anxious or low Your anxiety might not seem related to sex. For example, you may be worried about the cancer and how your treatment is going. Or you may be worried about money, or about how you family are coping with your illness.  These things can cause stress, which can affect your interest in sex. It's important to talk to your partner about how you feel.  Remember - the changes to your sex life don't usually last long. There's usually no medical reason to stop having sex during chemo. The drugs won't have any long term physical effects on your performance or enjoyment of sex. Cancer can't be passed on to your partner during sex  Contraception It's important to use reliable contraception during treatment. Avoid getting pregnant while you or your partner are having chemotherapy. This is because the drugs may harm the baby. Sometimes chemotherapy drugs can leave a man or woman infertile.  This means you would not be able to have children in the future. You might want to talk to someone about permanent infertility. It can be very difficult to learn that you may no longer be able to have children. Some people find counselling helpful. There might be ways to preserve your fertility, although this is easier for men than for women. You may want to speak to a fertility expert. You can talk about sperm banking or harvesting your eggs. You can also ask about other fertility options, such as donor eggs. If you have or have had breast cancer, your doctor might advise you not to take the contraceptive pill. This is because the hormones in it might affect the cancer. It is not known for sure whether or not chemotherapy drugs can be passed on  through semen or secretions from the vagina. Because of this some doctors advise people to use a barrier method if you have sex during treatment. This applies to  vaginal, anal or oral sex. Generally, doctors advise a barrier method only for the time you are actually having the treatment and for about a week after your treatment. Advice like this can be worrying, but this does not mean that you have to avoid being intimate with your partner. You can still have close contact with your partner and continue to enjoy sex.  Animals If you have cats or birds we just ask that you not change the litter or change the cage.  Please have someone else do this for you while you are on chemotherapy.   Food Safety During and After Cancer Treatment Food safety is important for people both during and after cancer treatment. Cancer and cancer treatments, such as chemotherapy, radiation therapy, and stem cell/bone marrow transplantation, often weaken the immune system. This makes it harder for your body to protect itself from foodborne illness, also called food poisoning. Foodborne illness is caused by eating food that contains harmful bacteria, parasites, or viruses.  Foods to avoid Some foods have a higher risk of becoming tainted with bacteria. These include: Unwashed fresh fruit and vegetables, especially leafy vegetables that can hide dirt and other contaminants Raw sprouts, such as alfalfa sprouts Raw or undercooked beef, especially ground beef, or other raw or undercooked meat and poultry Fatty, fried, or spicy foods immediately before or after treatment.  These can sit heavy on your stomach and make you feel nauseous. Raw or undercooked shellfish, such as oysters. Sushi and sashimi, which often contain raw fish.  Unpasteurized beverages, such as unpasteurized fruit juices, raw milk, raw yogurt, or cider Undercooked eggs, such as soft boiled, over easy, and poached; raw, unpasteurized eggs; or foods made with  raw egg, such as homemade raw cookie dough and homemade mayonnaise  Simple steps for food safety  Shop smart. Do not buy food stored or displayed in an unclean area. Do not buy bruised or damaged fruits or vegetables. Do not buy cans that have cracks, dents, or bulges. Pick up foods that can spoil at the end of your shopping trip and store them in a cooler on the way home.  Prepare and clean up foods carefully. Rinse all fresh fruits and vegetables under running water, and dry them with a clean towel or paper towel. Clean the top of cans before opening them. After preparing food, wash your hands for 20 seconds with hot water and soap. Pay special attention to areas between fingers and under nails. Clean your utensils and dishes with hot water and soap. Disinfect your kitchen and cutting boards using 1 teaspoon of liquid, unscented bleach mixed into 1 quart of water.    Dispose of old food. Eat canned and packaged food before its expiration date (the "use by" or "best before" date). Consume refrigerated leftovers within 3 to 4 days. After that time, throw out the food. Even if the food does not smell or look spoiled, it still may be unsafe. Some bacteria, such as Listeria, can grow even on foods stored in the refrigerator if they are kept for too long.  Take precautions when eating out. At restaurants, avoid buffets and salad bars where food sits out for a long time and comes in contact with many people. Food can become contaminated when someone with a virus, often a norovirus, or another "bug" handles it. Put any leftover food in a "to-go" container yourself, rather than having the server do it. And, refrigerate leftovers as soon as you get home. Choose restaurants that  are clean and that are willing to prepare your food as you order it cooked.   AT HOME MEDICATIONS:                                                                                                                                                                 Compazine/Prochlorperazine 10mg  tablet. Take 1 tablet every 6 hours as needed for nausea/vomiting. (This can make you sleepy)   EMLA cream. Apply a quarter size amount to port site 1 hour prior to chemo. Do not rub in. Cover with plastic wrap.    Diarrhea Sheet   If you are having loose stools/diarrhea, please purchase Imodium and begin taking as outlined:  At the first sign of poorly formed or loose stools you should begin taking Imodium (loperamide) 2 mg capsules.  Take two tablets (4mg ) followed by one tablet (2mg ) every 2 hours - DO NOT EXCEED 8 tablets in 24 hours.  If it is bedtime and you are having loose stools, take 2 tablets at bedtime, then 2 tablets every 4 hours until morning.   Always call the Cancer Center if you are having loose stools/diarrhea that you can't get under control.  Loose stools/diarrhea leads to dehydration (loss of water) in your body.  We have other options of trying to get the loose stools/diarrhea to stop but you must let us know!   Constipation Sheet  Colace - 100 mg capsules - take 2 capsules daily.  If this doesn't help then you can increase to 2 capsules twice daily.  Please call if the above does not work for you. Do not go more than 2 days without a bowel movement.  It is very important that you do not become constipated.  It will make you feel sick to your stomach (nausea) and can cause abdominal pain and vomiting.  Nausea Sheet   Compazine/Prochlorperazine 10mg  tablet. Take 1 tablet every 6 hours as needed for nausea/vomiting (This can make you drowsy).  If you are having persistent nausea (nausea that does not stop) please call the Cancer Center and let us know the amount of nausea that you are experiencing.  If you begin to vomit, you need to call the Cancer Center and if it is the weekend and you have vomited more than one time and can't get it to stop-go to the Emergency Room.  Persistent nausea/vomiting can lead  to dehydration (loss of fluid in your body) and will make you feel very weak and unwell. Ice chips, sips of clear liquids, foods that are at room temperature, crackers, and toast tend to be better tolerated.   SYMPTOMS TO REPORT AS SOON AS POSSIBLE AFTER TREATMENT:  FEVER GREATER THAN 100.5 F  CHILLS WITH OR WITHOUT FEVER  NAUSEA AND VOMITING  THAT IS NOT CONTROLLED WITH YOUR NAUSEA MEDICATION  UNUSUAL SHORTNESS OF BREATH  UNUSUAL BRUISING OR BLEEDING  TENDERNESS IN MOUTH AND THROAT WITH OR WITHOUT   PRESENCE OF ULCERS  URINARY PROBLEMS  BOWEL PROBLEMS  UNUSUAL RASH     Wear comfortable clothing and clothing appropriate for easy access to any Portacath or PICC line. Let us know if there is anything that we can do to make your therapy better!   What to do if you need assistance after hours or on the weekends: CALL [phone].  HOLD on the line, do not hang up.  You will hear multiple messages but at the end you will be connected with a nurse triage line.  They will contact the doctor if necessary.  Most of the time they will be able to assist you.  Do not call the hospital operator.     I have been informed and understand all of the instructions given to me and have received a copy. I have been instructed to call the clinic [phone] or my family physician as soon as possible for continued medical care, if indicated. I do not have any more questions at this time but understand that I may call the Cancer Center or the Patient Navigator at [phone] during office hours should I have questions or need assistance in obtaining follow-up care.

## 2022-12-20 NOTE — Progress Notes (Signed)

## 2022-12-20 NOTE — Progress Notes (Signed)
Hold Keytruda cycle 1 pending PDL status  Abraxane only 46/96/29  OK proceed without PA completed per PA leadership.  Patient needs to start today.  V.O. Dr Carilyn Goodpasture, PharmD  Pharmacist Chemotherapy Monitoring - Initial Assessment    Anticipated start date: 12/21/22   The following has been reviewed per standard work regarding the patient's treatment regimen: The patient's diagnosis, treatment plan and drug doses, and organ/hematologic function Lab orders and baseline tests specific to treatment regimen  The treatment plan start date, drug sequencing, and pre-medications Prior authorization status  Patient's documented medication list, including drug-drug interaction screen and prescriptions for anti-emetics and supportive care specific to the treatment regimen The drug concentrations, fluid compatibility, administration routes, and timing of the medications to be used The patient's access for treatment and lifetime cumulative dose history, if applicable  The patient's medication allergies and previous infusion related reactions, if applicable   Changes made to treatment plan:  Removed Keytruda from cycle 1 per Dr Ellin Saba instructions  Follow up needed:  N/A   Stephens Shire, [facility], 12/21/2022  9:14 AM

## 2022-12-20 NOTE — Progress Notes (Signed)
ON PATHWAY REGIMEN - Breast  No Change  Continue With Treatment as Ordered.  Original Decision Date/Time: 11/22/2022 17:58     A cycle is every 21 days:     Fam-trastuzumab deruxtecan-nxki   **Always confirm dose/schedule in your pharmacy ordering system**  Patient Characteristics: Distant Metastases or Locoregional Recurrent Disease - Unresected, M0 or Locally Advanced Unresectable Disease Progressing after Neoadjuvant and Local Therapies, M0, HER2 Low/Negative, ER Positive, Chemotherapy, HER2 Low, Second Line, [employer] or  Not a Candidate for Molecular Targeted Therapy Therapeutic Status: Distant Metastases HER2 Status: Low ER Status: Positive (+) PR Status: Positive (+) Therapy Approach Indicated: Standard Chemotherapy/Endocrine Therapy Line of Therapy: Second Line Intent of Therapy: Non-Curative / Palliative Intent, Discussed with Patient

## 2022-12-20 NOTE — Progress Notes (Signed)
DISCONTINUE ON PATHWAY REGIMEN - Breast     A cycle is every 21 days:     Fam-trastuzumab deruxtecan-nxki   **Always confirm dose/schedule in your pharmacy ordering system**  REASON: Other Reason PRIOR TREATMENT: [id number]: Fam-trastuzumab Deruxtecan 5.4 mg/kg q21 Days TREATMENT RESPONSE: Unable to Evaluate  START ON PATHWAY REGIMEN - Breast     Pembrolizumab: A cycle is every 21 days:     Pembrolizumab    Chemotherapy: A cycle is every 28 days:     Nab-paclitaxel (protein bound)   **Always confirm dose/schedule in your pharmacy ordering system**  Patient Characteristics: Distant Metastases or Locoregional Recurrent Disease - Unresected, M0 or Locally Advanced Unresectable Disease Progressing after Neoadjuvant and Local Therapies, M0, HER2 Low/Negative, ER Negative, Chemotherapy, HER2 Low, First Line, PD-L1 Expression  Positive by CPS ?10 Therapeutic Status: Distant Metastases HER2 Status: Low ER Status: Negative (-) PR Status: Negative (-) Therapy Approach Indicated: Standard Chemotherapy/Endocrine Therapy Line of Therapy: First Line Intent of Therapy: Non-Curative / Palliative Intent, Discussed with Patient

## 2022-12-21 ENCOUNTER — Encounter: Payer: Self-pay | Admitting: Hematology and Oncology

## 2022-12-21 ENCOUNTER — Encounter (HOSPITAL_COMMUNITY): Payer: Self-pay | Admitting: Hematology

## 2022-12-21 ENCOUNTER — Other Ambulatory Visit: Payer: Self-pay

## 2022-12-21 ENCOUNTER — Inpatient Hospital Stay: Payer: 59

## 2022-12-21 ENCOUNTER — Encounter (HOSPITAL_COMMUNITY): Payer: 59

## 2022-12-21 VITALS — BP 152/90 | HR 90 | Temp 98.4°F | Resp 18 | Wt 164.7 lb

## 2022-12-21 DIAGNOSIS — Z5111 Encounter for antineoplastic chemotherapy: Secondary | ICD-10-CM | POA: Diagnosis not present

## 2022-12-21 DIAGNOSIS — Z170 Estrogen receptor positive status [ER+]: Secondary | ICD-10-CM

## 2022-12-21 DIAGNOSIS — C50411 Malignant neoplasm of upper-outer quadrant of right female breast: Secondary | ICD-10-CM

## 2022-12-21 LAB — CBC WITH DIFFERENTIAL/PLATELET
Abs Immature Granulocytes: 0.21 10*3/uL — ABNORMAL HIGH (ref 0.00–0.07)
Basophils Absolute: 0.1 10*3/uL (ref 0.0–0.1)
Basophils Relative: 1 %
Eosinophils Absolute: 0.3 10*3/uL (ref 0.0–0.5)
Eosinophils Relative: 3 %
HCT: 37.1 % (ref 36.0–46.0)
Hemoglobin: 12.5 g/dL (ref 12.0–15.0)
Immature Granulocytes: 2 %
Lymphocytes Relative: 14 %
Lymphs Abs: 1.4 10*3/uL (ref 0.7–4.0)
MCH: 33.3 pg (ref 26.0–34.0)
MCHC: 33.7 g/dL (ref 30.0–36.0)
MCV: 98.9 fL (ref 80.0–100.0)
Monocytes Absolute: 0.7 10*3/uL (ref 0.1–1.0)
Monocytes Relative: 7 %
Neutro Abs: 7.6 10*3/uL (ref 1.7–7.7)
Neutrophils Relative %: 73 %
Platelets: 276 10*3/uL (ref 150–400)
RBC: 3.75 MIL/uL — ABNORMAL LOW (ref 3.87–5.11)
RDW: 13.7 % (ref 11.5–15.5)
WBC: 10.3 10*3/uL (ref 4.0–10.5)
nRBC: 0 % (ref 0.0–0.2)

## 2022-12-21 LAB — COMPREHENSIVE METABOLIC PANEL
ALT: 24 U/L (ref 0–44)
AST: 37 U/L (ref 15–41)
Albumin: 3.4 g/dL — ABNORMAL LOW (ref 3.5–5.0)
Alkaline Phosphatase: 123 U/L (ref 38–126)
Anion gap: 11 (ref 5–15)
BUN: 10 mg/dL (ref 6–20)
CO2: 22 mmol/L (ref 22–32)
Calcium: 8.5 mg/dL — ABNORMAL LOW (ref 8.9–10.3)
Chloride: 104 mmol/L (ref 98–111)
Creatinine, Ser: 0.6 mg/dL (ref 0.44–1.00)
GFR, Estimated: >60 mL/min (ref >60)
Glucose, Bld: 127 mg/dL — ABNORMAL HIGH (ref 70–99)
Potassium: 3.5 mmol/L (ref 3.5–5.1)
Sodium: 137 mmol/L (ref 135–145)
Total Bilirubin: 0.5 mg/dL (ref <1.2)
Total Protein: 7.3 g/dL (ref 6.5–8.1)

## 2022-12-21 LAB — MAGNESIUM: Magnesium: 1.7 mg/dL (ref 1.7–2.4)

## 2022-12-21 MED ORDER — PACLITAXEL PROTEIN-BOUND CHEMO INJECTION 100 MG
125.0000 mg/m2 | Freq: Once | INTRAVENOUS | Status: AC
  Administered 2022-12-21: 225 mg via INTRAVENOUS
  Filled 2022-12-21: qty 45

## 2022-12-21 MED ORDER — SODIUM CHLORIDE 0.9% FLUSH
10.0000 mL | Freq: Once | INTRAVENOUS | Status: AC
  Administered 2022-12-21: 10 mL via INTRAVENOUS

## 2022-12-21 MED ORDER — SODIUM CHLORIDE 0.9% FLUSH
10.0000 mL | INTRAVENOUS | Status: DC | PRN
  Administered 2022-12-21: 10 mL

## 2022-12-21 MED ORDER — PALONOSETRON HCL INJECTION 0.25 MG/5ML
0.2500 mg | Freq: Once | INTRAVENOUS | Status: AC
  Administered 2022-12-21: 0.25 mg via INTRAVENOUS
  Filled 2022-12-21: qty 5

## 2022-12-21 MED ORDER — HEPARIN SOD (PORK) LOCK FLUSH 100 UNIT/ML IV SOLN
500.0000 [IU] | Freq: Once | INTRAVENOUS | Status: AC | PRN
  Administered 2022-12-21: 500 [IU]

## 2022-12-21 MED ORDER — SODIUM CHLORIDE 0.9 % IV SOLN: INTRAVENOUS | Status: DC

## 2022-12-21 NOTE — Patient Instructions (Signed)
[facility] - A DEPT OF [facility]  Discharge Instructions: Thank you for choosing [facility] to provide your oncology and hematology care.  If you have a lab appointment with the Cancer Center - please note that after April 8th, 2024, all labs will be drawn in the cancer center.  You do not have to check in or register with the main entrance as you have in the past but will complete your check-in in the cancer center.  Wear comfortable clothing and clothing appropriate for easy access to any Portacath or PICC line.   We strive to give you quality time with your provider. You may need to reschedule your appointment if you arrive late (15 or more minutes).  Arriving late affects you and other patients whose appointments are after yours.  Also, if you miss three or more appointments without notifying the office, you may be dismissed from the clinic at the provider's discretion.      For prescription refill requests, have your pharmacy contact our office and allow 72 hours for refills to be completed.    Today you received the following chemotherapy and/or immunotherapy agents Abraxane, return as scheduled.   To help prevent nausea and vomiting after your treatment, we encourage you to take your nausea medication as directed.  BELOW ARE SYMPTOMS THAT SHOULD BE REPORTED IMMEDIATELY: *FEVER GREATER THAN 100.4 F (38 C) OR HIGHER *CHILLS OR SWEATING *NAUSEA AND VOMITING THAT IS NOT CONTROLLED WITH YOUR NAUSEA MEDICATION *UNUSUAL SHORTNESS OF BREATH *UNUSUAL BRUISING OR BLEEDING *URINARY PROBLEMS (pain or burning when urinating, or frequent urination) *BOWEL PROBLEMS (unusual diarrhea, constipation, pain near the anus) TENDERNESS IN MOUTH AND THROAT WITH OR WITHOUT PRESENCE OF ULCERS (sore throat, sores in mouth, or a toothache) UNUSUAL RASH, SWELLING OR PAIN  UNUSUAL VAGINAL DISCHARGE OR ITCHING   Items with * indicate a potential emergency and  should be followed up as soon as possible or go to the Emergency Department if any problems should occur.  Please show the CHEMOTHERAPY ALERT CARD or IMMUNOTHERAPY ALERT CARD at check-in to the Emergency Department and triage nurse.  Should you have questions after your visit or need to cancel or reschedule your appointment, please contact [facility] - A DEPT OF [facility] [phone]  and follow the prompts.  Office hours are 8:00 a.m. to 4:30 p.m. Monday - Friday. Please note that voicemails left after 4:00 p.m. may not be returned until the following business day.  We are closed weekends and major holidays. You have access to a nurse at all times for urgent questions. Please call the main number to the clinic [phone] and follow the prompts.  For any non-urgent questions, you may also contact your provider using MyChart. We now offer e-Visits for anyone 69 and older to request care online for non-urgent symptoms. For details visit [email or web address].   Also download the MyChart app! Go to the app store, search "MyChart", open the app, select , and log in with your MyChart username and password.

## 2022-12-21 NOTE — Addendum Note (Signed)
Addended by: Bertrum Sol on: 12/21/2022 08:22 AM   Modules accepted: Orders

## 2022-12-21 NOTE — Progress Notes (Signed)
Patient tolerated chemotherapy with no complaints voiced. Side effects with management reviewed understanding verbalized. Port site clean and dry with no bruising or swelling noted at site. Good blood return noted before and after administration of chemotherapy. Band aid applied. Patient left in satisfactory condition with VSS and no s/s of distress noted. 

## 2022-12-23 ENCOUNTER — Ambulatory Visit: Payer: 59 | Admitting: Hematology

## 2022-12-23 ENCOUNTER — Other Ambulatory Visit: Payer: Self-pay

## 2022-12-24 ENCOUNTER — Other Ambulatory Visit: Payer: Self-pay | Admitting: Hematology and Oncology

## 2022-12-24 ENCOUNTER — Ambulatory Visit (HOSPITAL_COMMUNITY): Payer: 59 | Admitting: Physical Therapy

## 2022-12-24 ENCOUNTER — Telehealth: Payer: Self-pay

## 2022-12-24 DIAGNOSIS — I890 Lymphedema, not elsewhere classified: Secondary | ICD-10-CM | POA: Diagnosis not present

## 2022-12-24 DIAGNOSIS — C50411 Malignant neoplasm of upper-outer quadrant of right female breast: Secondary | ICD-10-CM

## 2022-12-24 NOTE — Therapy (Signed)
OUTPATIENT PHYSICAL THERAPY LYMPHEDEMA TREATMENT  Patient Name: Monica Soto MRN: [id number] DOB:08-26-1965, 57 y.o., female Today's Date: 12/24/2022  END OF SESSION:  PT End of Session - 12/24/22 1353     Visit Number 6    Number of Visits 12    Date for PT Re-Evaluation 01/04/23    Authorization Type E-[employer] OTHER [id number] 06/08/2019   E-[employer] OTHER [id number]    Progress Note Due on Visit 10    PT Start Time 1350    PT Stop Time 1430    PT Time Calculation (min) 40 min    Activity Tolerance Patient tolerated treatment well    Behavior During Therapy [facility] for tasks assessed/performed             Past Medical History:  Diagnosis Date   Depression    Diabetes mellitus without complication (HCC)    Erosive esophagitis 04/26/2010   EGD by Dr. Jena Gauss, small hiatal hernia   GERD (gastroesophageal reflux disease)    History of stomach ulcers    IBS (irritable bowel syndrome)    Diarrhea predominant   Microscopic colitis 04/26/2010   Colonoscopy by Dr. Jena Gauss, good response with Entocort   Tubular adenoma of colon 04/26/2010   Junction of descending and sigmoid 40 CM from anus   Past Surgical History:  Procedure Laterality Date   ABDOMINAL HYSTERECTOMY     APPENDECTOMY  2/11   Dr. Malvin Johns with a delayed closure   BREAST BIOPSY Right 06/22/2022   [place] RT BREAST BX W LOC DEV 1ST LESION IMG BX SPEC US GUIDE 06/22/2022 [facility]   PORTACATH PLACEMENT N/A 07/16/2022   Procedure: INSERTION PORT-A-CATH;  Surgeon: Manus Rudd, MD;  Location: [facility];  Service: General;  Laterality: N/A;   TONSILLECTOMY     TUBAL LIGATION     Patient Active Problem List   Diagnosis Date Noted   Genetic testing 07/12/2022   Malignant neoplasm of upper-outer quadrant of right breast in female, estrogen receptor positive (HCC) 07/03/2022   Lymphocytic colitis 05/30/2010   GERD 04/17/2010   IRRITABLE BOWEL  SYNDROME 02/14/2010   DIARRHEA 02/14/2010   Abdominal pain 02/14/2010    PCP: Dr. Magda Paganini PROVIDER: Doreatha Massed  REFERRING DIAG: [phone] (ICD-10-CM) - Malignant neoplasm of upper-outer quadrant of right female breast  THERAPY DIAG:  Lymphedema, not elsewhere classified  Rationale for Evaluation and Treatment: Rehabilitation  ONSET DATE: 07/03/22  SUBJECTIVE:  SUBJECTIVE STATEMENT:   PT states she had her first chemo treatment on Friday and will have to go back every other week. Reports she slept in the reidsleeve and could tell a difference.  States her husband called Friday afternoon and ordered her sleeve for her.   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  Hypermetabolic metastatic lymph node in the posterior triangle of the right neck.  Hypermetabolic left axillary lymph nodes.  No evidence of metastatic adenopathy in the mediastinum/abdomen/pelvis.  No evidence of visceral metastasis.  Multiple skeletal metastasis: Left sacral ala 2.5 cm, left iliac bone, L4 and T12 vertebral bodies, multiple rib lesions, left scapular lesion PAIN:  Are you having pain? No  PRECAUTIONS: Other: cellulitis  WEIGHT BEARING RESTRICTIONS: No  FALLS:  Has patient fallen in last 6 months? No  LIVING ENVIRONMENT: Lives with: lives with their family OCCUPATION: not working   HAND DOMINANCE: right   PRIOR LEVEL OF FUNCTION: Independent  PATIENT GOALS: less swellin    OBJECTIVE: Note: Objective measures were completed at Evaluation unless otherwise noted.  COGNITION: Overall cognitive status: Within functional limits for tasks assessed   PALPATION: Noted  induration    UPPER EXTREMITY AROM/PROM: wfl      LYMPHEDEMA ASSESSMENTS: Pt has not had surgery to date.    [facility]MARK left eval  10 cm proximal to olecranon process 28.5  Olecranon process 26.7  10 cm proximal to ulnar styloid process 20  Just proximal to ulnar styloid process 16.8  Across hand at thumb web space 21  At base of 2nd digit 6.6  (Blank rows = not tested)  LANDMARK right  eval Right 12/19/22  10 cm proximal to olecranon process 29.5 29  Olecranon process 27 27.2  10 cm proximal to ulnar styloid process 23 24.3  Just proximal to ulnar styloid process 19 19  Across hand at thumb web space 22.3 21.5  At base of 2nd digit 7.4 7  At base of 3rd digit  7  At base of ring finger  6.7  At base of pinky finger  6  (Blank rows = not tested)   TODAY'S TREATMENT:                                                                                                                              DATE:  12/24/22 Manual lymph drainage for Rt UE including short neck, deep and superficial abdominal nodes, Rt axillary inguinal anastomosis as well as intra-axillary.   Extra time spent on fingers and hand.  12/19/22 Measurements (see above) Compression discussion/reidsleeve for night use Manual lymph drainage for Rt UE including short neck, deep and superficial abdominal nodes, Rt axillary inguinal anastomosis as well as intra-axillary.   Extra time spent on fingers and hand.   12/17/22 Manual lymph drainage for Rt UE including short neck, deep and  superficial abdominal nodes, Rt axillary inguinal anastomosis as well as intra-axillary.   Extra time spent on fingers and hand.   12/14/22 Manual lymph drainage for Rt UE including short neck, deep and superficial abdominal nodes, Rt axillary inguinal anastomosis as well as inter-axillary.  Posterior completed in side lying, anterior completed is supine.  Extra time spent at fingers and hand.   12/12/22 Manual lymph drainage for Rt UE  including short neck, deep and superficial abdominal nodes.  Rt axillary inguinal anastomosis.  Extra time spend on fingers as most indurated.  Education on compression; need to acquire the sleeve as instructed last visit.  Discussed compression bandaging if this is not successful.  12/05/22 Educate pt on what lymphedema is and four aspects of controlling edema. Educated pt on UE exercises to include shoulder flexion, abduction, IR/ER, bicep curl, fisting and diaphragm breathing Educate on self manual for 'Rt UE    PATIENT EDUCATION:  Education details: as above  Person educated: Patient Education method: Programmer, multimedia, Demonstration, Tactile cues, Verbal cues, and Handouts Education comprehension: verbalized understanding and returned demonstration  HOME EXERCISE PROGRAM: UE exercises and self manual.  Given measurements to obtain sleeve and glove  ASSESSMENT:  CLINICAL IMPRESSION: Rt UE appears to be slightly down today as compared to last week.  Continued with manual with less induration noted as well. Instructed to continue using the reidsleeve at night and bring the compression sleeve to therapy with her when she gets it to assure she is able to don/doff correctly as well fit is correct.   Pt will continue to benefit from skilled PT for decongestive techniques to improve her comfort and improve her skin integrity.    OBJECTIVE IMPAIRMENTS: decreased ROM, increased edema, and decreased skin integrity .   ACTIVITY LIMITATIONS: carrying, lifting, and dressing   PERSONAL FACTORS: 1 comorbidity: metastatic cancer  are also affecting patient's functional outcome.   REHAB POTENTIAL: Good  CLINICAL DECISION MAKING: Evolving/moderate complexity  EVALUATION COMPLEXITY: Moderate  GOALS: Goals reviewed with patient? No  SHORT TERM GOALS: Target date: 12/19/22  PT measurements from elbow to hand to be decreased 1 cm to decrease full feeling  Baseline: Goal status: on-going   2.  Pt  to be confident in self manual techniques. Baseline:  Goal status: on-going  LONG TERM GOALS: Target date: 01/03/23  Pt to have and be able to don and doff compression garments. Baseline:  Goal status: on-going  2.  Pt to have and be using a compression pump to assist in decreasing edema in Rt UE  Baseline:  Goal status: on-going   PLAN:  PT FREQUENCY: 3x/week  PT DURATION: 4 weeks  PLANNED INTERVENTIONS: 97110-Therapeutic exercises, 97530- Therapeutic activity, [id number]- Neuromuscular re-education, 97535- Self Care, and 82956- Manual therapy  PLAN FOR NEXT SESSION: Measure on Wednesdays.  Continue with manual.  Lurena Nida, PTA/CLT [facility] Ph: [phone]    12/24/2022, 1:54 PM

## 2022-12-24 NOTE — Telephone Encounter (Signed)
24-hour follow up call, patient complaining of slight nausea feeling. She is taking her compazine. Patient states she is not eating a lot, she is hydrating, has had one bout of diarrhea and took 2 imodium. She knows to call for any new issues.

## 2022-12-26 ENCOUNTER — Encounter (HOSPITAL_COMMUNITY): Payer: Self-pay

## 2022-12-26 ENCOUNTER — Ambulatory Visit (HOSPITAL_COMMUNITY): Payer: 59

## 2022-12-26 ENCOUNTER — Encounter (HOSPITAL_COMMUNITY): Payer: 59

## 2022-12-26 DIAGNOSIS — I890 Lymphedema, not elsewhere classified: Secondary | ICD-10-CM

## 2022-12-26 DIAGNOSIS — R293 Abnormal posture: Secondary | ICD-10-CM

## 2022-12-26 DIAGNOSIS — R600 Localized edema: Secondary | ICD-10-CM

## 2022-12-26 DIAGNOSIS — Z170 Estrogen receptor positive status [ER+]: Secondary | ICD-10-CM

## 2022-12-26 NOTE — Therapy (Signed)
OUTPATIENT PHYSICAL THERAPY LYMPHEDEMA TREATMENT  Patient Name: Monica Soto MRN: [id number] DOB:07/19/1965, 57 y.o., female Today's Date: 12/26/2022  END OF SESSION:  PT End of Session - 12/26/22 1054     Visit Number 7    Number of Visits 12    Date for PT Re-Evaluation 01/04/23    Authorization Type E-[employer] OTHER [id number] 06/08/2019   [employer] OTHER [id number]    Progress Note Due on Visit 10    PT Start Time 1018    PT Stop Time 1057    PT Time Calculation (min) 39 min    Activity Tolerance Patient tolerated treatment well    Behavior During Therapy [facility] for tasks assessed/performed              Past Medical History:  Diagnosis Date   Depression    Diabetes mellitus without complication (HCC)    Erosive esophagitis 04/26/2010   EGD by Dr. Jena Gauss, small hiatal hernia   GERD (gastroesophageal reflux disease)    History of stomach ulcers    IBS (irritable bowel syndrome)    Diarrhea predominant   Microscopic colitis 04/26/2010   Colonoscopy by Dr. Jena Gauss, good response with Entocort   Tubular adenoma of colon 04/26/2010   Junction of descending and sigmoid 40 CM from anus   Past Surgical History:  Procedure Laterality Date   ABDOMINAL HYSTERECTOMY     APPENDECTOMY  2/11   Dr. Malvin Johns with a delayed closure   BREAST BIOPSY Right 06/22/2022   [place] RT BREAST BX W LOC DEV 1ST LESION IMG BX SPEC US GUIDE 06/22/2022 [facility]   PORTACATH PLACEMENT N/A 07/16/2022   Procedure: INSERTION PORT-A-CATH;  Surgeon: Manus Rudd, MD;  Location: [facility];  Service: General;  Laterality: N/A;   TONSILLECTOMY     TUBAL LIGATION     Patient Active Problem List   Diagnosis Date Noted   Genetic testing 07/12/2022   Malignant neoplasm of upper-outer quadrant of right breast in female, estrogen receptor positive (HCC) 07/03/2022   Lymphocytic colitis 05/30/2010   GERD 04/17/2010   IRRITABLE BOWEL  SYNDROME 02/14/2010   DIARRHEA 02/14/2010   Abdominal pain 02/14/2010    PCP: Dr. Phillips Odor  REFERRING PROVIDER: Doreatha Massed  REFERRING DIAG: [phone] (ICD-10-CM) - Malignant neoplasm of upper-outer quadrant of right female breast  THERAPY DIAG:  Lymphedema, not elsewhere classified  Localized edema  Malignant neoplasm of upper-outer quadrant of right breast in female, estrogen receptor positive (HCC)  Abnormal posture  Rationale for Evaluation and Treatment: Rehabilitation  ONSET DATE: 07/03/22  SUBJECTIVE:  SUBJECTIVE STATEMENT:   PT states she had her first chemo treatment on Friday and will have to go back every other week. Reports she slept in the reidsleeve and could tell a difference.  States her husband called Friday afternoon and ordered her sleeve for her.   Reports swelling worse in the morning.  Began chemo on Friday, reports she extra fatigue.  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  Hypermetabolic metastatic lymph node in the posterior triangle of the right neck.  Hypermetabolic left axillary lymph nodes.  No evidence of metastatic adenopathy in the mediastinum/abdomen/pelvis.  No evidence of visceral metastasis.  Multiple skeletal metastasis: Left sacral ala 2.5 cm, left iliac bone, L4 and T12 vertebral bodies, multiple rib lesions, left scapular lesion PAIN:  Are you having pain? No  PRECAUTIONS: Other: cellulitis  WEIGHT BEARING RESTRICTIONS: No  FALLS:  Has patient fallen in last 6 months? No  LIVING ENVIRONMENT: Lives with: lives with their family OCCUPATION: not working    HAND DOMINANCE: right   PRIOR LEVEL OF FUNCTION: Independent  PATIENT GOALS: less swellin    OBJECTIVE: Note: Objective measures were completed at Evaluation unless otherwise noted.  COGNITION: Overall cognitive status: Within functional limits for tasks assessed   PALPATION: Noted induration    UPPER EXTREMITY AROM/PROM: wfl      LYMPHEDEMA ASSESSMENTS: Pt has not had surgery to date.    [facility] left eval  10 cm proximal to olecranon process 28.5  Olecranon process 26.7  10 cm proximal to ulnar styloid process 20  Just proximal to ulnar styloid process 16.8  Across hand at thumb web space 21  At base of 2nd digit 6.6  (Blank rows = not tested)  LANDMARK right  eval Right 12/19/22 Right 12/26/22  10 cm proximal to olecranon process 29.5 29 28.6  Olecranon process 27 27.2 26.8  10 cm proximal to ulnar styloid process 23 24.3 23.5  Just proximal to ulnar styloid process 19 19 18.4  Across hand at thumb web space 22.3 21.5 21.5  At base of 2nd digit 7.4 7 7   At base of 3rd digit  7 7  At base of ring finger  6.7 6.6  At base of pinky finger  6 6  (Blank rows = not tested)   TODAY'S TREATMENT:                                                                                                                              DATE:  12/26/22: Measurements (see above) Manual lymph drainage for Rt UE including short neck, deep and superficial abdominal nodes, Rt axillary inguinal anastomosis as well as intra-axillary.   Extra time spent on fingers and hand.  12/24/22 Manual lymph drainage for Rt UE including short neck, deep and superficial  abdominal nodes, Rt axillary inguinal anastomosis as well as intra-axillary.   Extra time spent on fingers and hand.  12/19/22 Measurements (see above) Compression discussion/reidsleeve for night use Manual lymph drainage for Rt UE including short neck, deep and superficial abdominal nodes, Rt axillary inguinal anastomosis as well  as intra-axillary.   Extra time spent on fingers and hand.   12/17/22 Manual lymph drainage for Rt UE including short neck, deep and superficial abdominal nodes, Rt axillary inguinal anastomosis as well as intra-axillary.   Extra time spent on fingers and hand.   12/14/22 Manual lymph drainage for Rt UE including short neck, deep and superficial abdominal nodes, Rt axillary inguinal anastomosis as well as inter-axillary.  Posterior completed in side lying, anterior completed is supine.  Extra time spent at fingers and hand.   12/12/22 Manual lymph drainage for Rt UE including short neck, deep and superficial abdominal nodes.  Rt axillary inguinal anastomosis.  Extra time spend on fingers as most indurated.  Education on compression; need to acquire the sleeve as instructed last visit.  Discussed compression bandaging if this is not successful.  12/05/22 Educate pt on what lymphedema is and four aspects of controlling edema. Educated pt on UE exercises to include shoulder flexion, abduction, IR/ER, bicep curl, fisting and diaphragm breathing Educate on self manual for 'Rt UE    PATIENT EDUCATION:  Education details: as above  Person educated: Patient Education method: Programmer, multimedia, Demonstration, Tactile cues, Verbal cues, and Handouts Education comprehension: verbalized understanding and returned demonstration  HOME EXERCISE PROGRAM: UE exercises and self manual.  Given measurements to obtain sleeve and glove  ASSESSMENT:  CLINICAL IMPRESSION: Measurements taken with some reduce in Rt UE though no change in hand and fingers.  Manual decongestive techniques complete for Rt axillary inguinal anastomosis with extra time spent on fingers as most indurated.  Noted some reduction in induration following manual.  Pt stated she has been sleeping with the reidsleeve and expecting compression glove to arrive.  Pt has began chemo and reports increased fatigue with possible new medication added this  Friday, stated she may need to cancel next apt.      OBJECTIVE IMPAIRMENTS: decreased ROM, increased edema, and decreased skin integrity .   ACTIVITY LIMITATIONS: carrying, lifting, and dressing   PERSONAL FACTORS: 1 comorbidity: metastatic cancer  are also affecting patient's functional outcome.   REHAB POTENTIAL: Good  CLINICAL DECISION MAKING: Evolving/moderate complexity  EVALUATION COMPLEXITY: Moderate  GOALS: Goals reviewed with patient? No  SHORT TERM GOALS: Target date: 12/19/22  PT measurements from elbow to hand to be decreased 1 cm to decrease full feeling  Baseline: Goal status: on-going   2.  Pt to be confident in self manual techniques. Baseline:  Goal status: on-going  LONG TERM GOALS: Target date: 01/03/23  Pt to have and be able to don and doff compression garments. Baseline:  Goal status: on-going  2.  Pt to have and be using a compression pump to assist in decreasing edema in Rt UE  Baseline:  Goal status: on-going   PLAN:  PT FREQUENCY: 3x/week  PT DURATION: 4 weeks  PLANNED INTERVENTIONS: 97110-Therapeutic exercises, 97530- Therapeutic activity, [id number]- Neuromuscular re-education, 97535- Self Care, and 43329- Manual therapy  PLAN FOR NEXT SESSION: Measure on Wednesdays.  Continue with manual.  Becky Sax, LPTA/CLT; CBIS [phone]  Juel Burrow, PTA 12/26/2022, 12:01 PM  12/26/2022, 12:01 PM

## 2022-12-28 ENCOUNTER — Encounter (HOSPITAL_COMMUNITY): Payer: 59 | Admitting: Physical Therapy

## 2022-12-28 ENCOUNTER — Inpatient Hospital Stay: Payer: 59

## 2022-12-28 VITALS — BP 151/85 | HR 82 | Temp 98.8°F | Resp 18 | Wt 167.3 lb

## 2022-12-28 DIAGNOSIS — Z170 Estrogen receptor positive status [ER+]: Secondary | ICD-10-CM

## 2022-12-28 DIAGNOSIS — Z5111 Encounter for antineoplastic chemotherapy: Secondary | ICD-10-CM | POA: Diagnosis not present

## 2022-12-28 LAB — COMPREHENSIVE METABOLIC PANEL
ALT: 21 U/L (ref 0–44)
AST: 28 U/L (ref 15–41)
Albumin: 3.4 g/dL — ABNORMAL LOW (ref 3.5–5.0)
Alkaline Phosphatase: 110 U/L (ref 38–126)
Anion gap: 8 (ref 5–15)
BUN: 10 mg/dL (ref 6–20)
CO2: 22 mmol/L (ref 22–32)
Calcium: 8.4 mg/dL — ABNORMAL LOW (ref 8.9–10.3)
Chloride: 106 mmol/L (ref 98–111)
Creatinine, Ser: 0.62 mg/dL (ref 0.44–1.00)
GFR, Estimated: >60 mL/min (ref >60)
Glucose, Bld: 124 mg/dL — ABNORMAL HIGH (ref 70–99)
Potassium: 3.5 mmol/L (ref 3.5–5.1)
Sodium: 136 mmol/L (ref 135–145)
Total Bilirubin: 0.5 mg/dL (ref <1.2)
Total Protein: 7 g/dL (ref 6.5–8.1)

## 2022-12-28 LAB — MAGNESIUM: Magnesium: 1.6 mg/dL — ABNORMAL LOW (ref 1.7–2.4)

## 2022-12-28 LAB — CBC WITH DIFFERENTIAL/PLATELET
Abs Immature Granulocytes: 0.14 10*3/uL — ABNORMAL HIGH (ref 0.00–0.07)
Basophils Absolute: 0.1 10*3/uL (ref 0.0–0.1)
Basophils Relative: 1 %
Eosinophils Absolute: 0.3 10*3/uL (ref 0.0–0.5)
Eosinophils Relative: 4 %
HCT: 35.7 % — ABNORMAL LOW (ref 36.0–46.0)
Hemoglobin: 12.1 g/dL (ref 12.0–15.0)
Immature Granulocytes: 2 %
Lymphocytes Relative: 19 %
Lymphs Abs: 1.5 10*3/uL (ref 0.7–4.0)
MCH: 33.5 pg (ref 26.0–34.0)
MCHC: 33.9 g/dL (ref 30.0–36.0)
MCV: 98.9 fL (ref 80.0–100.0)
Monocytes Absolute: 0.5 10*3/uL (ref 0.1–1.0)
Monocytes Relative: 6 %
Neutro Abs: 5.7 10*3/uL (ref 1.7–7.7)
Neutrophils Relative %: 68 %
Platelets: 312 10*3/uL (ref 150–400)
RBC: 3.61 MIL/uL — ABNORMAL LOW (ref 3.87–5.11)
RDW: 13.8 % (ref 11.5–15.5)
WBC: 8.3 10*3/uL (ref 4.0–10.5)
nRBC: 0.7 % — ABNORMAL HIGH (ref 0.0–0.2)

## 2022-12-28 MED ORDER — SODIUM CHLORIDE 0.9% FLUSH
10.0000 mL | INTRAVENOUS | Status: DC | PRN
  Administered 2022-12-28: 10 mL via INTRAVENOUS

## 2022-12-28 MED ORDER — HEPARIN SOD (PORK) LOCK FLUSH 100 UNIT/ML IV SOLN
500.0000 [IU] | Freq: Once | INTRAVENOUS | Status: AC | PRN
  Administered 2022-12-28: 500 [IU]

## 2022-12-28 MED ORDER — PACLITAXEL PROTEIN-BOUND CHEMO INJECTION 100 MG
125.0000 mg/m2 | Freq: Once | INTRAVENOUS | Status: AC
  Administered 2022-12-28: 225 mg via INTRAVENOUS
  Filled 2022-12-28: qty 45

## 2022-12-28 MED ORDER — PALONOSETRON HCL INJECTION 0.25 MG/5ML
0.2500 mg | Freq: Once | INTRAVENOUS | Status: AC
  Administered 2022-12-28: 0.25 mg via INTRAVENOUS
  Filled 2022-12-28: qty 5

## 2022-12-28 MED ORDER — MAGNESIUM SULFATE 2 GM/50ML IV SOLN
2.0000 g | Freq: Once | INTRAVENOUS | Status: AC
  Administered 2022-12-28: 2 g via INTRAVENOUS
  Filled 2022-12-28: qty 50

## 2022-12-28 MED ORDER — SODIUM CHLORIDE 0.9 % IV SOLN: INTRAVENOUS | Status: DC

## 2022-12-28 NOTE — Progress Notes (Signed)
Patient tolerated chemotherapy with no complaints voiced.  Side effects with management reviewed with understanding verbalized.  Port site clean and dry with no bruising or swelling noted at site.  Good blood return noted before and after administration of chemotherapy.  Band aid applied.  Patient left in satisfactory condition with VSS and no s/s of distress noted. All follow ups as scheduled.   Monica Soto [employer]

## 2022-12-28 NOTE — Patient Instructions (Signed)
[facility] - A DEPT OF [facility]  Discharge Instructions: Thank you for choosing [facility] to provide your oncology and hematology care.  If you have a lab appointment with the Cancer Center - please note that after April 8th, 2024, all labs will be drawn in the cancer center.  You do not have to check in or register with the main entrance as you have in the past but will complete your check-in in the cancer center.  Wear comfortable clothing and clothing appropriate for easy access to any Portacath or PICC line.   We strive to give you quality time with your provider. You may need to reschedule your appointment if you arrive late (15 or more minutes).  Arriving late affects you and other patients whose appointments are after yours.  Also, if you miss three or more appointments without notifying the office, you may be dismissed from the clinic at the provider's discretion.      For prescription refill requests, have your pharmacy contact our office and allow 72 hours for refills to be completed.    Today you received the following chemotherapy and/or immunotherapy agents Abraxane    To help prevent nausea and vomiting after your treatment, we encourage you to take your nausea medication as directed.  BELOW ARE SYMPTOMS THAT SHOULD BE REPORTED IMMEDIATELY: *FEVER GREATER THAN 100.4 F (38 C) OR HIGHER *CHILLS OR SWEATING *NAUSEA AND VOMITING THAT IS NOT CONTROLLED WITH YOUR NAUSEA MEDICATION *UNUSUAL SHORTNESS OF BREATH *UNUSUAL BRUISING OR BLEEDING *URINARY PROBLEMS (pain or burning when urinating, or frequent urination) *BOWEL PROBLEMS (unusual diarrhea, constipation, pain near the anus) TENDERNESS IN MOUTH AND THROAT WITH OR WITHOUT PRESENCE OF ULCERS (sore throat, sores in mouth, or a toothache) UNUSUAL RASH, SWELLING OR PAIN  UNUSUAL VAGINAL DISCHARGE OR ITCHING   Items with * indicate a potential emergency and should be followed up  as soon as possible or go to the Emergency Department if any problems should occur.  Please show the CHEMOTHERAPY ALERT CARD or IMMUNOTHERAPY ALERT CARD at check-in to the Emergency Department and triage nurse.  Should you have questions after your visit or need to cancel or reschedule your appointment, please contact [facility] - A DEPT OF [facility] [phone]  and follow the prompts.  Office hours are 8:00 a.m. to 4:30 p.m. Monday - Friday. Please note that voicemails left after 4:00 p.m. may not be returned until the following business day.  We are closed weekends and major holidays. You have access to a nurse at all times for urgent questions. Please call the main number to the clinic [phone] and follow the prompts.  For any non-urgent questions, you may also contact your provider using MyChart. We now offer e-Visits for anyone 19 and older to request care online for non-urgent symptoms. For details visit [email or web address].   Also download the MyChart app! Go to the app store, search "MyChart", open the app, select [facility], and log in with your MyChart username and password.

## 2023-01-08 NOTE — Progress Notes (Signed)
[facility] [address] [place] [address]    Clinic Day:  01/10/2023  Referring physician: Assunta Found, MD  Patient Care Team: Monica Found, MD as PCP - General (Family Medicine) Monica Soto, Monica Friends, MD (Gastroenterology) Monica Blackbird, MD as Consulting Physician (Radiation Oncology) Monica Rudd, MD as Consulting Physician (General Surgery) Monica Proud, RN as Oncology Nurse Navigator Monica Angelica, RN as Oncology Nurse Navigator Monica Massed, MD as Medical Oncologist (Medical Oncology) Monica Sarah, RN as Oncology Nurse Navigator (Medical Oncology)   ASSESSMENT & PLAN:   Assessment: 1.  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  Noticed skin changes in the right breast 3 months ago. - Mammogram (06/19/2022): Large 8-9 cm mass in the right breast with 2 abnormal axillary lymph nodes and skin thickening. 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  1.8 cm ill-defined enhancing mass in the lateral left breast suspicious for additional malignancy.  Right breast mass involves right chest wall including pectoralis and intercostal muscles.  Prominent subcentimeter left axillary lymph nodes. - PET scan (07/12/2022): Intensely hypermetabolic right breast mass with right axillary lymph nodes.  Hypermetabolic metastatic lymph node in the posterior triangle of the right neck.  Hypermetabolic left axillary lymph nodes.  No evidence of metastatic adenopathy in the mediastinum/abdomen/pelvis.  No evidence of visceral metastasis.  Multiple skeletal metastasis:  Left sacral ala 2.5 cm, left iliac bone, L4 and T12 vertebral bodies, multiple rib lesions, left scapular lesion. - She was evaluated by Dr. Al Soto and was recommended treatment with ribociclib and anastrozole. - Germline mutation testing: Negative. - Anastrozole and ribociclib started on 07/25/2022, discontinued on 12/13/2022 due to progression. - PET scan (11/15/2022): Mixed response to therapy with overall progression of disease with new bone metastasis and liver metastasis.  New periportal lymph nodes. - Liver biopsy (12/07/2022): Metastatic poorly differentiated adenocarcinoma consistent with breast primary.  ER +2% with moderate staining, PR 0%, HER2 (1+). - Guardant360: APC G1312, T p53, MYC amplification, FGF R2 amplification, EGFR amplification - PD-L1 (22 C3): 0% - Abraxane day 1, day 8 every 21 days started on 12/21/2022, carboplatin AUC on day 1 and day 8 added with cycle 2 on 01/10/2023   2.  Social/family history: - She lives at home with her husband.  Used OCP for approximately 16-18 years.  Underwent hysterectomy after endometrial ablation when she was Soto to have fibroids causing pain.  No family history of breast or other malignancies.  Smokes 1 pack of cigarettes per day.    Plan: 1.  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  She felt tired for 2 to 3 days after day day 1 of treatment and had diarrhea for 2 days after day 8 of treatment.  She also noticed hair thinning for the last 2 days. - She reported pain in her breast has improved since Abraxane started. - She continues to have foul-smelling discharge from the breast.  Ulceration has also spread to the left breast area about a month ago. - Reviewed labs today: Normal LFTs.  CBC grossly  normal. - We talked about adding carboplatin to the regimen as she is not a candidate for pembrolizumab.  Discussed side effects in detail. - She will proceed with cycle 2 with carboplatin AUC  2 on day 1 and day 8 with Abraxane.  RTC 3 weeks for follow-up. - I will give her Bactrim DS twice daily for 10 days for the right breast discharge. - I have recommended NGS testing on the liver biopsy.   2.  Bone metastasis: - Calcium is 8.4.  Continue Zometa every 12 weeks.  Continue calcium supplements.  3.  Right upper extremity lymphedema: - We have previously referred her to lymphedema clinic.    Orders Placed This Encounter  Procedures   CBC with Differential    Standing Status:   Future    Standing Expiration Date:   03/14/2024   Comprehensive metabolic panel    Standing Status:   Future    Standing Expiration Date:   03/14/2024   Magnesium    Standing Status:   Future    Standing Expiration Date:   03/14/2024   CBC with Differential    Standing Status:   Future    Standing Expiration Date:   03/21/2024   Comprehensive metabolic panel    Standing Status:   Future    Standing Expiration Date:   03/21/2024   Magnesium    Standing Status:   Future    Standing Expiration Date:   03/21/2024   CBC with Differential    Standing Status:   Future    Standing Expiration Date:   04/04/2024   Comprehensive metabolic panel    Standing Status:   Future    Standing Expiration Date:   04/04/2024   Magnesium    Standing Status:   Future    Standing Expiration Date:   04/04/2024   CBC with Differential    Standing Status:   Future    Standing Expiration Date:   04/11/2024   Comprehensive metabolic panel    Standing Status:   Future    Standing Expiration Date:   04/11/2024   Magnesium    Standing Status:   Future    Standing Expiration Date:   04/11/2024      Monica Soto,acting as a scribe for Monica Massed, MD.,have documented all relevant documentation on the behalf of Monica Massed, MD,as directed by  Monica Massed, MD while in the presence of Monica Massed, MD.  I, Monica Massed MD, have reviewed the above documentation for accuracy and completeness,  and I agree with the above.      Monica Massed, MD   12/5/20245:24 PM  CHIEF COMPLAINT:   Diagnosis: metastatic inflammatory right breast cancer    Cancer Staging  Malignant neoplasm of upper-outer quadrant of right breast in female, estrogen receptor positive (HCC) Staging form: Breast, AJCC 8th Edition - Clinical: Stage IV (cT4, cN2, cM1, G2, ER+, PR+, HER2-) - Signed by Rachel Moulds, MD on 07/17/2022    Prior Therapy: none  Current Therapy:  Anastrozole and ribociclib    HISTORY OF PRESENT ILLNESS:   Oncology History  Malignant neoplasm of upper-outer quadrant of right breast in female, estrogen receptor positive (HCC)  06/19/2022 Mammogram   Mammogram showed large mass in the right breast which appears to measure 8 to 9 cm on ultrasound, 2 abnormal right axillary lymph node and a third borderline lymph node.  Skin thickening noted along the medial aspect of left breast which may be an extension of the marked  skin thickening diffusely on the right.  Otherwise no left breast abnormalities are identified.   06/22/2022 Pathology Results   Needle core biopsy from the right breast upper outer quadrant showed grade 2 IDC, both lymph nodes with metastatic adenocarcinoma.  Prognostic showed ER 90% positive strong staining PR 70% positive strong staining Ki-67 of 20% HER2 negative by IHC 1+   07/03/2022 Initial Diagnosis   Malignant neoplasm of upper-outer quadrant of right breast in female, estrogen receptor positive (HCC)   07/04/2022 Cancer Staging   Staging form: Breast, AJCC 8th Edition - Clinical: Stage IV (cT4, cN2, cM1, G2, ER+, PR+, HER2-) - Signed by Rachel Moulds, MD on 07/17/2022 Stage prefix: Initial diagnosis Histologic grading system: 3 grade system   07/16/2022 - 07/16/2022 Chemotherapy   Patient is on Treatment Plan : BREAST ADJUVANT DOSE DENSE AC q14d / PACLitaxel q7d      Genetic Testing   [facility] Custom Panel+RNA was Negative. Report date is  07/10/2022.  The Custom Hereditary Cancers Panel offered by [facility] includes sequencing and/or deletion duplication testing of the following 43 genes: APC, ATM, AXIN2, BAP1, BARD1, BMPR1A, BRCA1, BRCA2, BRIP1, CDH1, CDK4, CDKN2A (p14ARF and p16INK4a only), CHEK2, CTNNA1, EPCAM (Deletion/duplication testing only), FH, GREM1 (promoter region duplication testing only), HOXB13, KIT, MBD4, MEN1, MLH1, MSH2, MSH3, MSH6, MUTYH, NF1, NHTL1, PALB2, PDGFRA, PMS2, POLD1, POLE, PTEN, RAD51C, RAD51D, SMAD4, SMARCA4. STK11, TP53, TSC1, TSC2, and VHL.   12/13/2022 - 12/13/2022 Chemotherapy   Patient is on Treatment Plan : BREAST METASTATIC Fam-Trastuzumab Deruxtecan-nxki (Enhertu) (5.4) q21d     12/21/2022 -  Chemotherapy   Patient is on Treatment Plan : BREAST Paclitaxel-Albumin (Abraxane) (100) D1,8 + Carboplatin AUC2 D1,8 every 3 weeks, q21d        INTERVAL HISTORY:   Monica Soto is a 57 y.o. female presenting to clinic today for follow up of metastatic inflammatory right breast cancer. She was last seen by me on 12/13/22.  Today, she states that she is doing well overall. Her appetite level is at 100%. Her energy level is at 75%.   She tolerated her first cycle well. Her second cycle of chemotherapy was given with Magnesium and was not tolerated as well. She had mild nausea that was resolved with compazine. She states she had diarrhea for 2 days after last chemotherapy, treated with Imodium, and 1-2 days of increased tiredness after last chemotherapy. She denies any vomiting or constipation.   She notes burning, itching, pain, and malodorous oozing in the left and right breast. Her ulcer has spread to the right breast 1 month ago that began as a burning and itching sensation, then became ulcer that is oozing. Since starting chemotherapy pain has lessened. She changes dressings 2-3 times a day and treats ulcers with witch hazel oil. Keflex has not been effective in treating symptoms. She states Bactrim was more  effective in reducing severity of symptoms and did not cause any side effects. She also reports hair thinning from chemotherapy that began 2 days ago.   PAST MEDICAL HISTORY:   Past Medical History: Past Medical History:  Diagnosis Date   Depression    Diabetes mellitus without complication (HCC)    Erosive esophagitis 04/26/2010   EGD by Dr. Jena Soto, small hiatal hernia   GERD (gastroesophageal reflux disease)    History of stomach ulcers    IBS (irritable bowel syndrome)    Diarrhea predominant   Microscopic colitis 04/26/2010   Colonoscopy by Dr. Jena Soto, good response with Entocort   Tubular adenoma  of colon 04/26/2010   Junction of descending and sigmoid 40 CM from anus    Surgical History: Past Surgical History:  Procedure Laterality Date   ABDOMINAL HYSTERECTOMY     APPENDECTOMY  2/11   Dr. Malvin Johns with a delayed closure   BREAST BIOPSY Right 06/22/2022   [place] RT BREAST BX W LOC DEV 1ST LESION IMG BX SPEC US GUIDE 06/22/2022 [facility] MAMMOGRAPHY   PORTACATH PLACEMENT N/A 07/16/2022   Procedure: INSERTION PORT-A-CATH;  Surgeon: Monica Rudd, MD;  Location: [facility];  Service: General;  Laterality: N/A;   TONSILLECTOMY     TUBAL LIGATION      Social History: Social History   Socioeconomic History   Marital status: Married    Spouse name: Not on file   Number of children: Not on file   Years of education: Not on file   Highest education level: Not on file  Occupational History    Employer: [employer]    Comment: Third shift  Tobacco Use   Smoking status: Every Day    Current packs/day: 0.50    Types: Cigarettes   Smokeless tobacco: Never  Vaping Use   Vaping status: Never Used  Substance and Sexual Activity   Alcohol use: Yes    Alcohol/week: 1.0 standard drink of alcohol    Types: 1 Glasses of wine per week    Comment: seldom   Drug use: No   Sexual activity: Yes    Birth control/protection: Surgical  Other Topics Concern   Not on file   Social History Narrative   Not on file   Social Determinants of Health   Financial Resource Strain: Not on file  Food Insecurity: No Food Insecurity (07/04/2022)   Hunger Vital Sign    Worried About Running Out of Food in the Last Year: Never true    Ran Out of Food in the Last Year: Never true  Transportation Needs: No Transportation Needs (07/04/2022)   PRAPARE - Administrator, Civil Service (Medical): No    Lack of Transportation (Non-Medical): No  Physical Activity: Not on file  Stress: Not on file  Social Connections: Not on file  Intimate Partner Violence: Not on file    Family History: Family History  Problem Relation Age of Onset   Diabetes Mother    Coronary artery disease Mother    Healthy Father     Current Medications:  Current Outpatient Medications:    Aspirin-Acetaminophen-Caffeine (GOODY HEADACHE PO), Take 1 packet by mouth 2 (two) times daily as needed (for pain)., Disp: , Rfl:    cephALEXin (KEFLEX) 500 MG capsule, Take 1 capsule (500 mg total) by mouth 3 (three) times daily., Disp: 21 capsule, Rfl: 1   Cholecalciferol 1.25 MG (50000 UT) capsule, Take 1 capsule by mouth once a week., Disp: , Rfl:    HYDROcodone-acetaminophen (NORCO/VICODIN) 5-325 MG tablet, Take 1 tablet by mouth every 6 (six) hours as needed for moderate pain., Disp: 15 tablet, Rfl: 0   sulfamethoxazole-trimethoprim (BACTRIM DS) 800-160 MG tablet, Take 1 tablet by mouth 2 (two) times daily., Disp: 28 tablet, Rfl: 0   sulfamethoxazole-trimethoprim (BACTRIM DS) 800-160 MG tablet, Take 1 tablet by mouth 2 (two) times daily., Disp: 20 tablet, Rfl: 0   lidocaine-prilocaine (EMLA) cream, Apply to affected area once (Patient not taking: Reported on 01/10/2023), Disp: 30 g, Rfl: 3   ondansetron (ZOFRAN) 8 MG tablet, TAKE 1 TABLET(8 MG) BY MOUTH EVERY 8 HOURS AS NEEDED FOR NAUSEA  OR VOMITING. START THIRD DAY AFTER DOXORUBICIN/ CYCLOPHOSPHAMIDE CHEMOTHERAPY (Patient not taking: Reported on  01/10/2023), Disp: 30 tablet, Rfl: 1   prochlorperazine (COMPAZINE) 10 MG tablet, Take 1 tablet (10 mg total) by mouth every 6 (six) hours as needed for nausea or vomiting. (Patient not taking: Reported on 01/10/2023), Disp: 30 tablet, Rfl: 1 No current facility-administered medications for this visit.  Facility-Administered Medications Ordered in Other Visits:    0.9 %  sodium chloride infusion, , Intravenous, Continuous, Monica Massed, MD, Stopped at 01/10/23 1232   sodium chloride flush (NS) 0.9 % injection 10 mL, 10 mL, Intracatheter, PRN, Monica Massed, MD, 10 mL at 01/10/23 1232   Allergies: No Known Allergies  REVIEW OF SYSTEMS:   Review of Systems  Constitutional:  Negative for chills, fatigue and fever.  HENT:   Negative for lump/mass, mouth sores, nosebleeds, sore throat and trouble swallowing.   Eyes:  Negative for eye problems.  Respiratory:  Positive for cough. Negative for shortness of breath.   Cardiovascular:  Negative for chest pain, leg swelling and palpitations.  Gastrointestinal:  Negative for abdominal pain, constipation, diarrhea, nausea and vomiting.  Genitourinary:  Negative for bladder incontinence, difficulty urinating, dysuria, frequency, hematuria and nocturia.   Musculoskeletal:  Negative for arthralgias, back pain, flank pain, myalgias and neck pain.       +bilateral breast pain, 7-8/10 severity  Skin:  Negative for itching and rash.  Neurological:  Positive for headaches. Negative for dizziness and numbness.  Hematological:  Does not bruise/bleed easily.  Psychiatric/Behavioral:  Negative for depression, sleep disturbance and suicidal ideas. The patient is not nervous/anxious.   All other systems reviewed and are negative.    VITALS:   Weight 167 lb 12.8 oz (76.1 kg).  Wt Readings from Last 3 Encounters:  01/10/23 167 lb 12.8 oz (76.1 kg)  12/28/22 167 lb 4.8 oz (75.9 kg)  12/21/22 164 lb 11.2 oz (74.7 kg)    Body mass index is 27.92  kg/m.  Performance status (ECOG): 1 - Symptomatic but completely ambulatory   PHYSICAL EXAM:   Physical Exam Vitals and nursing note reviewed. Exam conducted with a chaperone present.  Constitutional:      Appearance: Normal appearance.  Cardiovascular:     Rate and Rhythm: Normal rate and regular rhythm.     Pulses: Normal pulses.     Heart sounds: Normal heart sounds.  Pulmonary:     Effort: Pulmonary effort is normal.     Breath sounds: Normal breath sounds.  Chest:     Comments: +ulcerated area in the left breast +large ulcerated area on right breast Abdominal:     Palpations: Abdomen is soft. There is no hepatomegaly, splenomegaly or mass.     Tenderness: There is no abdominal tenderness.  Musculoskeletal:     Right lower leg: No edema.     Left lower leg: No edema.  Lymphadenopathy:     Cervical: No cervical adenopathy.     Right cervical: No superficial, deep or posterior cervical adenopathy.    Left cervical: No superficial, deep or posterior cervical adenopathy.     Upper Body:     Right upper body: No supraclavicular or axillary adenopathy.     Left upper body: No supraclavicular or axillary adenopathy.  Neurological:     General: No focal deficit present.     Mental Status: She is alert and oriented to person, place, and time.  Psychiatric:        Mood and Affect: Mood normal.  Behavior: Behavior normal.     LABS:      Latest Ref Rng & Units 01/10/2023    8:28 AM 12/28/2022    9:14 AM 12/21/2022    8:14 AM  CBC  WBC 4.0 - 10.5 K/uL 7.2  8.3  10.3   Hemoglobin 12.0 - 15.0 g/dL 40.9  81.1  91.4   Hematocrit 36.0 - 46.0 % 38.3  35.7  37.1   Platelets 150 - 400 K/uL 266  312  276       Latest Ref Rng & Units 01/10/2023    8:28 AM 12/28/2022    9:14 AM 12/21/2022    8:14 AM  CMP  Glucose 70 - 99 mg/dL 782  956  213   BUN 6 - 20 mg/dL 8  10  10    Creatinine 0.44 - 1.00 mg/dL 0.86  5.78  4.69   Sodium 135 - 145 mmol/L 138  136  137    Potassium 3.5 - 5.1 mmol/L 3.7  3.5  3.5   Chloride 98 - 111 mmol/L 106  106  104   CO2 22 - 32 mmol/L 23  22  22    Calcium 8.9 - 10.3 mg/dL 8.4  8.4  8.5   Total Protein 6.5 - 8.1 g/dL 6.8  7.0  7.3   Total Bilirubin <1.2 mg/dL 0.5  0.5  0.5   Alkaline Phos 38 - 126 U/L 96  110  123   AST 15 - 41 U/L 24  28  37   ALT 0 - 44 U/L 14  21  24       No results Soto for: "CEA1", "CEA" / No results Soto for: "CEA1", "CEA" No results Soto for: "PSA1" No results Soto for: "GEX528" No results Soto for: "CAN125"  No results Soto for: "TOTALPROTELP", "ALBUMINELP", "A1GS", "A2GS", "BETS", "BETA2SER", "GAMS", "MSPIKE", "SPEI" No results Soto for: "TIBC", "FERRITIN", "IRONPCTSAT" No results Soto for: "LDH"   STUDIES:   No results Soto.

## 2023-01-09 ENCOUNTER — Other Ambulatory Visit: Payer: Self-pay

## 2023-01-10 ENCOUNTER — Inpatient Hospital Stay: Payer: 59

## 2023-01-10 ENCOUNTER — Inpatient Hospital Stay: Payer: 59 | Attending: Hematology and Oncology | Admitting: Hematology

## 2023-01-10 VITALS — BP 145/81 | HR 80 | Temp 98.1°F | Resp 18

## 2023-01-10 VITALS — Wt 167.8 lb

## 2023-01-10 DIAGNOSIS — Z9049 Acquired absence of other specified parts of digestive tract: Secondary | ICD-10-CM | POA: Insufficient documentation

## 2023-01-10 DIAGNOSIS — Z5111 Encounter for antineoplastic chemotherapy: Secondary | ICD-10-CM | POA: Insufficient documentation

## 2023-01-10 DIAGNOSIS — Z79630 Long term (current) use of alkylating agent: Secondary | ICD-10-CM | POA: Diagnosis not present

## 2023-01-10 DIAGNOSIS — N6320 Unspecified lump in the left breast, unspecified quadrant: Secondary | ICD-10-CM | POA: Diagnosis not present

## 2023-01-10 DIAGNOSIS — Z833 Family history of diabetes mellitus: Secondary | ICD-10-CM | POA: Insufficient documentation

## 2023-01-10 DIAGNOSIS — R234 Changes in skin texture: Secondary | ICD-10-CM | POA: Diagnosis not present

## 2023-01-10 DIAGNOSIS — C787 Secondary malignant neoplasm of liver and intrahepatic bile duct: Secondary | ICD-10-CM | POA: Diagnosis not present

## 2023-01-10 DIAGNOSIS — M533 Sacrococcygeal disorders, not elsewhere classified: Secondary | ICD-10-CM | POA: Diagnosis not present

## 2023-01-10 DIAGNOSIS — Z79633 Long term (current) use of mitotic inhibitor: Secondary | ICD-10-CM | POA: Diagnosis not present

## 2023-01-10 DIAGNOSIS — Z8249 Family history of ischemic heart disease and other diseases of the circulatory system: Secondary | ICD-10-CM | POA: Insufficient documentation

## 2023-01-10 DIAGNOSIS — Z1722 Progesterone receptor negative status: Secondary | ICD-10-CM | POA: Diagnosis not present

## 2023-01-10 DIAGNOSIS — Z860101 Personal history of adenomatous and serrated colon polyps: Secondary | ICD-10-CM | POA: Insufficient documentation

## 2023-01-10 DIAGNOSIS — I890 Lymphedema, not elsewhere classified: Secondary | ICD-10-CM | POA: Insufficient documentation

## 2023-01-10 DIAGNOSIS — Z170 Estrogen receptor positive status [ER+]: Secondary | ICD-10-CM | POA: Insufficient documentation

## 2023-01-10 DIAGNOSIS — Z90710 Acquired absence of both cervix and uterus: Secondary | ICD-10-CM | POA: Insufficient documentation

## 2023-01-10 DIAGNOSIS — Z1721 Progesterone receptor positive status: Secondary | ICD-10-CM | POA: Insufficient documentation

## 2023-01-10 DIAGNOSIS — F17210 Nicotine dependence, cigarettes, uncomplicated: Secondary | ICD-10-CM | POA: Insufficient documentation

## 2023-01-10 DIAGNOSIS — C7951 Secondary malignant neoplasm of bone: Secondary | ICD-10-CM | POA: Diagnosis not present

## 2023-01-10 DIAGNOSIS — C50411 Malignant neoplasm of upper-outer quadrant of right female breast: Secondary | ICD-10-CM | POA: Diagnosis present

## 2023-01-10 DIAGNOSIS — Z79811 Long term (current) use of aromatase inhibitors: Secondary | ICD-10-CM | POA: Insufficient documentation

## 2023-01-10 DIAGNOSIS — Z79899 Other long term (current) drug therapy: Secondary | ICD-10-CM | POA: Insufficient documentation

## 2023-01-10 LAB — CBC WITH DIFFERENTIAL/PLATELET
Abs Immature Granulocytes: 0.18 10*3/uL — ABNORMAL HIGH (ref 0.00–0.07)
Basophils Absolute: 0.1 10*3/uL (ref 0.0–0.1)
Basophils Relative: 1 %
Eosinophils Absolute: 0.1 10*3/uL (ref 0.0–0.5)
Eosinophils Relative: 1 %
HCT: 38.3 % (ref 36.0–46.0)
Hemoglobin: 12.2 g/dL (ref 12.0–15.0)
Immature Granulocytes: 3 %
Lymphocytes Relative: 18 %
Lymphs Abs: 1.3 10*3/uL (ref 0.7–4.0)
MCH: 31.4 pg (ref 26.0–34.0)
MCHC: 31.9 g/dL (ref 30.0–36.0)
MCV: 98.7 fL (ref 80.0–100.0)
Monocytes Absolute: 0.7 10*3/uL (ref 0.1–1.0)
Monocytes Relative: 10 %
Neutro Abs: 4.8 10*3/uL (ref 1.7–7.7)
Neutrophils Relative %: 67 %
Platelets: 266 10*3/uL (ref 150–400)
RBC: 3.88 MIL/uL (ref 3.87–5.11)
RDW: 14.6 % (ref 11.5–15.5)
WBC: 7.2 10*3/uL (ref 4.0–10.5)
nRBC: 0.3 % — ABNORMAL HIGH (ref 0.0–0.2)

## 2023-01-10 LAB — COMPREHENSIVE METABOLIC PANEL
ALT: 14 U/L (ref 0–44)
AST: 24 U/L (ref 15–41)
Albumin: 3.5 g/dL (ref 3.5–5.0)
Alkaline Phosphatase: 96 U/L (ref 38–126)
Anion gap: 9 (ref 5–15)
BUN: 8 mg/dL (ref 6–20)
CO2: 23 mmol/L (ref 22–32)
Calcium: 8.4 mg/dL — ABNORMAL LOW (ref 8.9–10.3)
Chloride: 106 mmol/L (ref 98–111)
Creatinine, Ser: 0.63 mg/dL (ref 0.44–1.00)
GFR, Estimated: >60 mL/min (ref >60)
Glucose, Bld: 126 mg/dL — ABNORMAL HIGH (ref 70–99)
Potassium: 3.7 mmol/L (ref 3.5–5.1)
Sodium: 138 mmol/L (ref 135–145)
Total Bilirubin: 0.5 mg/dL (ref <1.2)
Total Protein: 6.8 g/dL (ref 6.5–8.1)

## 2023-01-10 LAB — MAGNESIUM: Magnesium: 1.8 mg/dL (ref 1.7–2.4)

## 2023-01-10 MED ORDER — PALONOSETRON HCL INJECTION 0.25 MG/5ML
0.2500 mg | Freq: Once | INTRAVENOUS | Status: AC
Start: 2023-01-10 — End: 2023-01-10
  Administered 2023-01-10: 0.25 mg via INTRAVENOUS
  Filled 2023-01-10: qty 5

## 2023-01-10 MED ORDER — HEPARIN SOD (PORK) LOCK FLUSH 100 UNIT/ML IV SOLN
500.0000 [IU] | Freq: Once | INTRAVENOUS | Status: AC | PRN
  Administered 2023-01-10: 500 [IU]

## 2023-01-10 MED ORDER — SODIUM CHLORIDE 0.9 % IV SOLN: INTRAVENOUS | Status: DC

## 2023-01-10 MED ORDER — SODIUM CHLORIDE 0.9% FLUSH
10.0000 mL | Freq: Once | INTRAVENOUS | Status: AC
  Administered 2023-01-10: 10 mL via INTRAVENOUS

## 2023-01-10 MED ORDER — SODIUM CHLORIDE 0.9 % IV SOLN: 10.0000 mg | Freq: Once | INTRAVENOUS | Status: DC

## 2023-01-10 MED ORDER — PACLITAXEL PROTEIN-BOUND CHEMO INJECTION 100 MG
125.0000 mg/m2 | Freq: Once | INTRAVENOUS | Status: AC
  Administered 2023-01-10: 225 mg via INTRAVENOUS
  Filled 2023-01-10: qty 45

## 2023-01-10 MED ORDER — SODIUM CHLORIDE 0.9 % IV SOLN
235.0000 mg | Freq: Once | INTRAVENOUS | Status: AC
  Administered 2023-01-10: 240 mg via INTRAVENOUS
  Filled 2023-01-10: qty 24

## 2023-01-10 MED ORDER — SODIUM CHLORIDE 0.9% FLUSH
10.0000 mL | INTRAVENOUS | Status: DC | PRN
  Administered 2023-01-10: 10 mL

## 2023-01-10 MED ORDER — SULFAMETHOXAZOLE-TRIMETHOPRIM 800-160 MG PO TABS: 1.0000 | ORAL_TABLET | Freq: Two times a day (BID) | ORAL | 0 refills | Status: DC

## 2023-01-10 MED ORDER — DEXAMETHASONE SODIUM PHOSPHATE 10 MG/ML IJ SOLN
10.0000 mg | Freq: Once | INTRAMUSCULAR | Status: AC
Start: 2023-01-10 — End: 2023-01-10
  Administered 2023-01-10: 10 mg via INTRAVENOUS
  Filled 2023-01-10: qty 1

## 2023-01-10 NOTE — Patient Instructions (Signed)
[facility] [place] - A DEPT OF [facility]  Discharge Instructions: Thank you for choosing [facility] to provide your oncology and hematology care.  If you have a lab appointment with the Cancer Center - please note that after April 8th, 2024, all labs will be drawn in the cancer center.  You do not have to check in or register with the main entrance as you have in the past but will complete your check-in in the cancer center.  Wear comfortable clothing and clothing appropriate for easy access to any Portacath or PICC line.   We strive to give you quality time with your provider. You may need to reschedule your appointment if you arrive late (15 or more minutes).  Arriving late affects you and other patients whose appointments are after yours.  Also, if you miss three or more appointments without notifying the office, you may be dismissed from the clinic at the provider's discretion.      For prescription refill requests, have your pharmacy contact our office and allow 72 hours for refills to be completed.    Today you received the following chemotherapy and/or immunotherapy agents abraxane and carboplatin      To help prevent nausea and vomiting after your treatment, we encourage you to take your nausea medication as directed.  BELOW ARE SYMPTOMS THAT SHOULD BE REPORTED IMMEDIATELY: *FEVER GREATER THAN 100.4 F (38 C) OR HIGHER *CHILLS OR SWEATING *NAUSEA AND VOMITING THAT IS NOT CONTROLLED WITH YOUR NAUSEA MEDICATION *UNUSUAL SHORTNESS OF BREATH *UNUSUAL BRUISING OR BLEEDING *URINARY PROBLEMS (pain or burning when urinating, or frequent urination) *BOWEL PROBLEMS (unusual diarrhea, constipation, pain near the anus) TENDERNESS IN MOUTH AND THROAT WITH OR WITHOUT PRESENCE OF ULCERS (sore throat, sores in mouth, or a toothache) UNUSUAL RASH, SWELLING OR PAIN  UNUSUAL VAGINAL DISCHARGE OR ITCHING   Items with * indicate a potential emergency and  should be followed up as soon as possible or go to the Emergency Department if any problems should occur.  Please show the CHEMOTHERAPY ALERT CARD or IMMUNOTHERAPY ALERT CARD at check-in to the Emergency Department and triage nurse.  Should you have questions after your visit or need to cancel or reschedule your appointment, please contact [facility] [place] - A DEPT OF [facility] [phone]  and follow the prompts.  Office hours are 8:00 a.m. to 4:30 p.m. Monday - Friday. Please note that voicemails left after 4:00 p.m. may not be returned until the following business day.  We are closed weekends and major holidays. You have access to a nurse at all times for urgent questions. Please call the main number to the clinic [phone] and follow the prompts.  For any non-urgent questions, you may also contact your provider using MyChart. We now offer e-Visits for anyone 48 and older to request care online for non-urgent symptoms. For details visit [email or web address].   Also download the MyChart app! Go to the app store, search "MyChart", open the app, select [facility], and log in with your MyChart username and password.

## 2023-01-10 NOTE — Patient Instructions (Signed)

## 2023-01-10 NOTE — Progress Notes (Signed)

## 2023-01-10 NOTE — Progress Notes (Signed)
Patient has been examined by Dr. Ellin Saba. Vital signs and labs have been reviewed by MD - ANC, Creatinine, LFTs, hemoglobin, and platelets are within treatment parameters per M.D. - pt may proceed with treatment. Carboplatin will be added to tx regimen today. Primary RN and pharmacy notified.

## 2023-01-14 ENCOUNTER — Other Ambulatory Visit: Payer: 59

## 2023-01-14 ENCOUNTER — Inpatient Hospital Stay: Payer: 59 | Admitting: Hematology

## 2023-01-14 ENCOUNTER — Encounter (HOSPITAL_COMMUNITY): Payer: Self-pay | Admitting: Hematology

## 2023-01-14 ENCOUNTER — Inpatient Hospital Stay: Payer: 59

## 2023-01-14 VITALS — BP 139/94 | HR 98 | Temp 98.6°F | Resp 18 | Ht 65.0 in | Wt 164.7 lb

## 2023-01-14 DIAGNOSIS — Z5111 Encounter for antineoplastic chemotherapy: Secondary | ICD-10-CM | POA: Diagnosis not present

## 2023-01-14 DIAGNOSIS — Z170 Estrogen receptor positive status [ER+]: Secondary | ICD-10-CM

## 2023-01-14 MED ORDER — SODIUM CHLORIDE 0.9% FLUSH
10.0000 mL | Freq: Once | INTRAVENOUS | Status: AC | PRN
  Administered 2023-01-14: 10 mL

## 2023-01-14 MED ORDER — HEPARIN SOD (PORK) LOCK FLUSH 100 UNIT/ML IV SOLN
500.0000 [IU] | Freq: Once | INTRAVENOUS | Status: AC | PRN
Start: 2023-01-14 — End: 2023-01-14
  Administered 2023-01-14: 500 [IU]

## 2023-01-14 MED ORDER — ZOLEDRONIC ACID 4 MG/100ML IV SOLN
4.0000 mg | Freq: Once | INTRAVENOUS | Status: AC
  Administered 2023-01-14: 4 mg via INTRAVENOUS
  Filled 2023-01-14: qty 100

## 2023-01-14 MED ORDER — SODIUM CHLORIDE 0.9 % IV SOLN
Freq: Once | INTRAVENOUS | Status: AC
Start: 2023-01-14 — End: 2023-01-14

## 2023-01-14 NOTE — Patient Instructions (Signed)

## 2023-01-14 NOTE — Progress Notes (Signed)
Labs not needed today per pharmacy.

## 2023-01-15 ENCOUNTER — Other Ambulatory Visit: Payer: Self-pay

## 2023-01-17 ENCOUNTER — Inpatient Hospital Stay: Payer: 59

## 2023-01-17 VITALS — BP 132/81 | HR 86 | Temp 97.4°F | Resp 16

## 2023-01-17 DIAGNOSIS — C50411 Malignant neoplasm of upper-outer quadrant of right female breast: Secondary | ICD-10-CM

## 2023-01-17 DIAGNOSIS — Z5111 Encounter for antineoplastic chemotherapy: Secondary | ICD-10-CM | POA: Diagnosis not present

## 2023-01-17 LAB — COMPREHENSIVE METABOLIC PANEL
ALT: 20 U/L (ref 0–44)
AST: 24 U/L (ref 15–41)
Albumin: 3.7 g/dL (ref 3.5–5.0)
Alkaline Phosphatase: 111 U/L (ref 38–126)
Anion gap: 8 (ref 5–15)
BUN: 10 mg/dL (ref 6–20)
CO2: 19 mmol/L — ABNORMAL LOW (ref 22–32)
Calcium: 8.3 mg/dL — ABNORMAL LOW (ref 8.9–10.3)
Chloride: 105 mmol/L (ref 98–111)
Creatinine, Ser: 0.67 mg/dL (ref 0.44–1.00)
GFR, Estimated: >60 mL/min (ref >60)
Glucose, Bld: 150 mg/dL — ABNORMAL HIGH (ref 70–99)
Potassium: 4.2 mmol/L (ref 3.5–5.1)
Sodium: 132 mmol/L — ABNORMAL LOW (ref 135–145)
Total Bilirubin: 0.4 mg/dL (ref <1.2)
Total Protein: 7 g/dL (ref 6.5–8.1)

## 2023-01-17 LAB — CBC WITH DIFFERENTIAL/PLATELET
Abs Immature Granulocytes: 0.15 10*3/uL — ABNORMAL HIGH (ref 0.00–0.07)
Basophils Absolute: 0.1 10*3/uL (ref 0.0–0.1)
Basophils Relative: 1 %
Eosinophils Absolute: 0.1 10*3/uL (ref 0.0–0.5)
Eosinophils Relative: 2 %
HCT: 38 % (ref 36.0–46.0)
Hemoglobin: 12.6 g/dL (ref 12.0–15.0)
Immature Granulocytes: 2 %
Lymphocytes Relative: 17 %
Lymphs Abs: 1.4 10*3/uL (ref 0.7–4.0)
MCH: 32.6 pg (ref 26.0–34.0)
MCHC: 33.2 g/dL (ref 30.0–36.0)
MCV: 98.2 fL (ref 80.0–100.0)
Monocytes Absolute: 0.4 10*3/uL (ref 0.1–1.0)
Monocytes Relative: 5 %
Neutro Abs: 5.9 10*3/uL (ref 1.7–7.7)
Neutrophils Relative %: 73 %
Platelets: 277 10*3/uL (ref 150–400)
RBC: 3.87 MIL/uL (ref 3.87–5.11)
RDW: 14.7 % (ref 11.5–15.5)
WBC: 8 10*3/uL (ref 4.0–10.5)
nRBC: 0 % (ref 0.0–0.2)

## 2023-01-17 LAB — MAGNESIUM: Magnesium: 1.9 mg/dL (ref 1.7–2.4)

## 2023-01-17 MED ORDER — SODIUM CHLORIDE 0.9% FLUSH
10.0000 mL | INTRAVENOUS | Status: DC | PRN
  Administered 2023-01-17: 10 mL

## 2023-01-17 MED ORDER — HEPARIN SOD (PORK) LOCK FLUSH 100 UNIT/ML IV SOLN
500.0000 [IU] | Freq: Once | INTRAVENOUS | Status: AC | PRN
  Administered 2023-01-17: 500 [IU]

## 2023-01-17 MED ORDER — DEXAMETHASONE SODIUM PHOSPHATE 10 MG/ML IJ SOLN
10.0000 mg | Freq: Once | INTRAMUSCULAR | Status: AC
  Administered 2023-01-17: 10 mg via INTRAVENOUS
  Filled 2023-01-17: qty 1

## 2023-01-17 MED ORDER — SODIUM CHLORIDE 0.9 % IV SOLN
240.0000 mg | Freq: Once | INTRAVENOUS | Status: AC
  Administered 2023-01-17: 240 mg via INTRAVENOUS
  Filled 2023-01-17: qty 24

## 2023-01-17 MED ORDER — SODIUM CHLORIDE 0.9 % IV SOLN
INTRAVENOUS | Status: DC
Start: 2023-01-17 — End: 2023-01-17

## 2023-01-17 MED ORDER — SODIUM CHLORIDE 0.9% FLUSH
10.0000 mL | INTRAVENOUS | Status: DC | PRN
  Administered 2023-01-17: 10 mL via INTRAVENOUS

## 2023-01-17 MED ORDER — PALONOSETRON HCL INJECTION 0.25 MG/5ML
0.2500 mg | Freq: Once | INTRAVENOUS | Status: AC
Start: 2023-01-17 — End: 2023-01-17
  Administered 2023-01-17: 0.25 mg via INTRAVENOUS
  Filled 2023-01-17: qty 5

## 2023-01-17 MED ORDER — PACLITAXEL PROTEIN-BOUND CHEMO INJECTION 100 MG
125.0000 mg/m2 | Freq: Once | INTRAVENOUS | Status: AC
  Administered 2023-01-17: 225 mg via INTRAVENOUS
  Filled 2023-01-17: qty 45

## 2023-01-17 NOTE — Patient Instructions (Signed)
[facility] [place] - [facility] DE[facility]  Discharge Instructions: Thank you for choosing [facility] to provide your oncology and hematology care.  If you have a lab appointment with the Cancer Center - please note that after April 8th, 2024, all labs will be drawn in the cancer center.  You do not have to check in or register with the main entrance as you have in the past but will complete your check-in in the cancer center.  Wear comfortable clothing and clothing appropriate for easy access to any Portacath or PICC line.   We strive to give you quality time with your provider. You may need to reschedule your appointment if you arrive late (15 or more minutes).  Arriving late affects you and other patients whose appointments are after yours.  Also, if you miss three or more appointments without notifying the office, you may be dismissed from the clinic at the provider's discretion.      For prescription refill requests, have your pharmacy contact our office and allow 72 hours for refills to be completed.    Today you received the following chemotherapy and/or immunotherapy agents Abraxane/Carboplatin   To help prevent nausea and vomiting after your treatment, we encourage you to take your nausea medication as directed.  BELOW ARE SYMPTOMS THAT SHOULD BE REPORTED IMMEDIATELY: *FEVER GREATER THAN 100.4 F (38 C) OR HIGHER *CHILLS OR SWEATING *NAUSEA AND VOMITING THAT IS NOT CONTROLLED WITH YOUR NAUSEA MEDICATION *UNUSUAL SHORTNESS OF BREATH *UNUSUAL BRUISING OR BLEEDING *URINARY PROBLEMS (pain or burning when urinating, or frequent urination) *BOWEL PROBLEMS (unusual diarrhea, constipation, pain near the anus) TENDERNESS IN MOUTH AND THROAT WITH OR WITHOUT PRESENCE OF ULCERS (sore throat, sores in mouth, or a toothache) UNUSUAL RASH, SWELLING OR PAIN  UNUSUAL VAGINAL DISCHARGE OR ITCHING   Items with * indicate a potential emergency and should be  followed up as soon as possible or go to the Emergency Department if any problems should occur.  Please show the CHEMOTHERAPY ALERT CARD or IMMUNOTHERAPY ALERT CARD at check-in to the Emergency Department and triage nurse.  Should you have questions after your visit or need to cancel or reschedule your appointment, please contact [facility] [place] - A DEPT OF Eligha Bridegroom [facility] [phone]  and follow the prompts.  Office hours are 8:00 a.m. to 4:30 p.m. Monday - Friday. Please note that voicemails left after 4:00 p.m. may not be returned until the following business day.  We are closed weekends and major holidays. You have access to a nurse at all times for urgent questions. Please call the main number to the clinic [phone] and follow the prompts.  For any non-urgent questions, you may also contact your provider using MyChart. We now offer e-Visits for anyone 70 and older to request care online for non-urgent symptoms. For details visit [email or web address].   Also download the MyChart app! Go to the app store, search "MyChart", open the app, select [place], and log in with your MyChart username and password.

## 2023-01-17 NOTE — Progress Notes (Signed)
Patient presents today for Abraxane/Carboplatin infusion.  Patient is in satisfactory condition with no new complaints voiced.  Vital signs are stable.  Labs reviewed and all labs are within treatment parameters.  We will proceed with treatment per MD orders.    Treatment given today per MD orders. Tolerated infusion without adverse affects. Vital signs stable. No complaints at this time. Discharged from clinic ambulatory in stable condition. Alert and oriented x 3. F/U with [facility] as scheduled.

## 2023-01-21 ENCOUNTER — Encounter (HOSPITAL_COMMUNITY): Payer: Self-pay | Admitting: Physical Therapy

## 2023-01-21 NOTE — Therapy (Unsigned)
PHYSICAL THERAPY DISCHARGE SUMMARY  Visits from Start of Care: 7  Current functional level related to goals / functional outcomes: Unknown as pt called to state that she is in Chemo and can not make both PT and chemo appointments. PT states she is better, got her pump and is doing well with her manual and exercises    Remaining deficits: edema   Education / Equipment: UE exercises, self manual and the importance of wearing as sleeve and using the pump.   Patient agrees to discharge. Patient goals were met. Patient is being discharged due to being pleased with the current functional level.   Virgina Organ, PT CLT [phone]

## 2023-01-22 ENCOUNTER — Telehealth: Payer: Self-pay

## 2023-01-22 NOTE — Telephone Encounter (Signed)
Notified Patient of completion of Attending Physician's Progress Report for Disability Claim. Fax transmission confirmation received. Copy of Progress Report emailed to Patient as requested. No other needs or concerns voiced at this time.

## 2023-01-31 ENCOUNTER — Inpatient Hospital Stay: Payer: 59 | Admitting: Hematology

## 2023-01-31 ENCOUNTER — Inpatient Hospital Stay: Payer: 59

## 2023-01-31 VITALS — BP 159/95 | HR 87 | Temp 98.1°F | Resp 18 | Ht 65.0 in | Wt 169.0 lb

## 2023-01-31 DIAGNOSIS — Z170 Estrogen receptor positive status [ER+]: Secondary | ICD-10-CM | POA: Diagnosis not present

## 2023-01-31 DIAGNOSIS — C50411 Malignant neoplasm of upper-outer quadrant of right female breast: Secondary | ICD-10-CM

## 2023-01-31 DIAGNOSIS — Z5111 Encounter for antineoplastic chemotherapy: Secondary | ICD-10-CM | POA: Diagnosis not present

## 2023-01-31 LAB — COMPREHENSIVE METABOLIC PANEL
ALT: 15 U/L (ref 0–44)
AST: 21 U/L (ref 15–41)
Albumin: 3.4 g/dL — ABNORMAL LOW (ref 3.5–5.0)
Alkaline Phosphatase: 109 U/L (ref 38–126)
Anion gap: 8 (ref 5–15)
BUN: 11 mg/dL (ref 6–20)
CO2: 20 mmol/L — ABNORMAL LOW (ref 22–32)
Calcium: 8.4 mg/dL — ABNORMAL LOW (ref 8.9–10.3)
Chloride: 108 mmol/L (ref 98–111)
Creatinine, Ser: 0.54 mg/dL (ref 0.44–1.00)
GFR, Estimated: >60 mL/min (ref >60)
Glucose, Bld: 135 mg/dL — ABNORMAL HIGH (ref 70–99)
Potassium: 3.6 mmol/L (ref 3.5–5.1)
Sodium: 136 mmol/L (ref 135–145)
Total Bilirubin: 0.4 mg/dL (ref <1.2)
Total Protein: 6.5 g/dL (ref 6.5–8.1)

## 2023-01-31 LAB — MAGNESIUM: Magnesium: 1.7 mg/dL (ref 1.7–2.4)

## 2023-01-31 LAB — CBC WITH DIFFERENTIAL/PLATELET
Abs Immature Granulocytes: 0.05 10*3/uL (ref 0.00–0.07)
Basophils Absolute: 0.1 10*3/uL (ref 0.0–0.1)
Basophils Relative: 1 %
Eosinophils Absolute: 0 10*3/uL (ref 0.0–0.5)
Eosinophils Relative: 1 %
HCT: 35.2 % — ABNORMAL LOW (ref 36.0–46.0)
Hemoglobin: 11.4 g/dL — ABNORMAL LOW (ref 12.0–15.0)
Immature Granulocytes: 1 %
Lymphocytes Relative: 28 %
Lymphs Abs: 1.5 10*3/uL (ref 0.7–4.0)
MCH: 31.8 pg (ref 26.0–34.0)
MCHC: 32.4 g/dL (ref 30.0–36.0)
MCV: 98.1 fL (ref 80.0–100.0)
Monocytes Absolute: 0.6 10*3/uL (ref 0.1–1.0)
Monocytes Relative: 10 %
Neutro Abs: 3.2 10*3/uL (ref 1.7–7.7)
Neutrophils Relative %: 59 %
Platelets: 245 10*3/uL (ref 150–400)
RBC: 3.59 MIL/uL — ABNORMAL LOW (ref 3.87–5.11)
RDW: 15.8 % — ABNORMAL HIGH (ref 11.5–15.5)
WBC: 5.4 10*3/uL (ref 4.0–10.5)
nRBC: 0 % (ref 0.0–0.2)

## 2023-01-31 MED ORDER — DEXAMETHASONE SODIUM PHOSPHATE 10 MG/ML IJ SOLN
10.0000 mg | Freq: Once | INTRAMUSCULAR | Status: AC
  Administered 2023-01-31: 10 mg via INTRAVENOUS
  Filled 2023-01-31: qty 1

## 2023-01-31 MED ORDER — SODIUM CHLORIDE 0.9 % IV SOLN
INTRAVENOUS | Status: DC
Start: 2023-01-31 — End: 2023-01-31

## 2023-01-31 MED ORDER — SODIUM CHLORIDE 0.9 % IV SOLN
235.0000 mg | Freq: Once | INTRAVENOUS | Status: AC
  Administered 2023-01-31: 240 mg via INTRAVENOUS
  Filled 2023-01-31: qty 24

## 2023-01-31 MED ORDER — HEPARIN SOD (PORK) LOCK FLUSH 100 UNIT/ML IV SOLN
500.0000 [IU] | Freq: Once | INTRAVENOUS | Status: AC | PRN
  Administered 2023-01-31: 500 [IU]

## 2023-01-31 MED ORDER — SODIUM CHLORIDE 0.9% FLUSH
10.0000 mL | INTRAVENOUS | Status: DC | PRN
  Administered 2023-01-31: 10 mL

## 2023-01-31 MED ORDER — PACLITAXEL PROTEIN-BOUND CHEMO INJECTION 100 MG
125.0000 mg/m2 | Freq: Once | INTRAVENOUS | Status: AC
Start: 2023-01-31 — End: 2023-01-31
  Administered 2023-01-31: 225 mg via INTRAVENOUS
  Filled 2023-01-31: qty 45

## 2023-01-31 MED ORDER — PALONOSETRON HCL INJECTION 0.25 MG/5ML
0.2500 mg | Freq: Once | INTRAVENOUS | Status: AC
  Administered 2023-01-31: 0.25 mg via INTRAVENOUS
  Filled 2023-01-31: qty 5

## 2023-01-31 MED ORDER — SODIUM CHLORIDE 0.9% FLUSH
10.0000 mL | Freq: Once | INTRAVENOUS | Status: AC
  Administered 2023-01-31: 10 mL via INTRAVENOUS

## 2023-01-31 NOTE — Progress Notes (Signed)
Patient presents today for Abraxane, Carboplatin and follow up visit with Dr. Ellin Saba. Labs pending.   Message received from A.Gilmore Laroche / Dr. Ellin Saba to proceed with treatment. Labs and vital signs within parameters for treatment.  Treatment given today per MD orders. Tolerated infusion without adverse affects. Vital signs stable. No complaints at this time. Discharged from clinic ambulatory in stable condition. Alert and oriented x 3. F/U with [facility] as scheduled.

## 2023-01-31 NOTE — Patient Instructions (Signed)
[facility] [place] [facility]  Discharge Instructions: Thank you for choosing  Cancer Center to provide your oncology and hematology care.  If you have a lab appointment with the Cancer Center - please note that after April 8th, 2024, all labs will be drawn in the cancer center.  You do not have to check in or register with the main entrance as you have in the past but will complete your check-in in the cancer center.  Wear comfortable clothing and clothing appropriate for easy access to any Portacath or PICC line.   We strive to give you quality time with your provider. You may need to reschedule your appointment if you arrive late (15 or more minutes).  Arriving late affects you and other patients whose appointments are after yours.  Also, if you miss three or more appointments without notifying the office, you may be dismissed from the clinic at the provider's discretion.      For prescription refill requests, have your pharmacy contact our office and allow 72 hours for refills to be completed.    Today you received the following chemotherapy and/or immunotherapy agents Abraxane, Carboplatin. Carboplatin Injection What is this medication? CARBOPLATIN (KAR boe pla tin) treats some types of cancer. It works by slowing down the growth of cancer cells. This medicine may be used for other purposes; ask your health care provider or pharmacist if you have questions. COMMON BRAND NAME(S): Paraplatin What should I tell my care team before I take this medication? They need to know if you have any of these conditions: Blood disorders Hearing problems Kidney disease Recent or ongoing radiation therapy An unusual or allergic reaction to carboplatin, cisplatin, other medications, foods, dyes, or preservatives Pregnant or trying to get pregnant Breast-feeding How should I use this medication? This medication is injected into a vein. It is given by your  care team in a hospital or clinic setting. Talk to your care team about the use of this medication in children. Special care may be needed. Overdosage: If you think you have taken too much of this medicine contact a poison control center or emergency room at once. NOTE: This medicine is only for you. Do not share this medicine with others. What if I miss a dose? Keep appointments for follow-up doses. It is important not to miss your dose. Call your care team if you are unable to keep an appointment. What may interact with this medication? Medications for seizures Some antibiotics, such as amikacin, gentamicin, neomycin, streptomycin, tobramycin Vaccines This list may not describe all possible interactions. Give your health care provider a list of all the medicines, herbs, non-prescription drugs, or dietary supplements you use. Also tell them if you smoke, drink alcohol, or use illegal drugs. Some items may interact with your medicine. What should I watch for while using this medication? Your condition will be monitored carefully while you are receiving this medication. You may need blood work while taking this medication. This medication may make you feel generally unwell. This is not uncommon, as chemotherapy can affect healthy cells as well as cancer cells. Report any side effects. Continue your course of treatment even though you feel ill unless your care team tells you to stop. In some cases, you may be given additional medications to help with side effects. Follow all directions for their use. This medication may increase your risk of getting an infection. Call your care team for advice if  you get a fever, chills, sore throat, or other symptoms of a cold or flu. Do not treat yourself. Try to avoid being around people who are sick. Avoid taking medications that contain aspirin, acetaminophen, ibuprofen, naproxen, or ketoprofen unless instructed by your care team. These medications may hide a  fever. Be careful brushing or flossing your teeth or using a toothpick because you may get an infection or bleed more easily. If you have any dental work done, tell your dentist you are receiving this medication. Talk to your care team if you wish to become pregnant or think you might be pregnant. This medication can cause serious birth defects. Talk to your care team about effective forms of contraception. Do not breast-feed while taking this medication. What side effects may I notice from receiving this medication? Side effects that you should report to your care team as soon as possible: Allergic reactions--skin rash, itching, hives, swelling of the face, lips, tongue, or throat Infection--fever, chills, cough, sore throat, wounds that don't heal, pain or trouble when passing urine, general feeling of discomfort or being unwell Low red blood cell level--unusual weakness or fatigue, dizziness, headache, trouble breathing Pain, tingling, or numbness in the hands or feet, muscle weakness, change in vision, confusion or trouble speaking, loss of balance or coordination, trouble walking, seizures Unusual bruising or bleeding Side effects that usually do not require medical attention (report to your care team if they continue or are bothersome): Hair loss Nausea Unusual weakness or fatigue Vomiting This list may not describe all possible side effects. Call your doctor for medical advice about side effects. You may report side effects to FDA at 1-800-FDA-1088. Where should I keep my medication? This medication is given in a hospital or clinic. It will not be stored at home. NOTE: This sheet is a summary. It may not cover all possible information. If you have questions about this medicine, talk to your doctor, pharmacist, or health care provider.  2024 Elsevier/Gold Standard (2021-05-16 00:00:00) Paclitaxel Nanoparticle Albumin-Bound Injection What is this medication? NANOPARTICLE ALBUMIN-BOUND  PACLITAXEL (Na no PAHR ti kuhl al BYOO muhn-bound PAK li TAX el) treats some types of cancer. It works by slowing down the growth of cancer cells. This medicine may be used for other purposes; ask your health care provider or pharmacist if you have questions. COMMON BRAND NAME(S): Abraxane What should I tell my care team before I take this medication? They need to know if you have any of these conditions: Liver disease Low white blood cell levels An unusual or allergic reaction to paclitaxel, albumin, other medications, foods, dyes, or preservatives If you or your partner are pregnant or trying to get pregnant Breast-feeding How should I use this medication? This medication is injected into a vein. It is given by your care team in a hospital or clinic setting. Talk to your care team about the use of this medication in children. Special care may be needed. Overdosage: If you think you have taken too much of this medicine contact a poison control center or emergency room at once. NOTE: This medicine is only for you. Do not share this medicine with others. What if I miss a dose? Keep appointments for follow-up doses. It is important not to miss your dose. Call your care team if you are unable to keep an appointment. What may interact with this medication? Other medications may affect the way this medication works. Talk with your care team about all of the medications you take. They  may suggest changes to your treatment plan to lower the risk of side effects and to make sure your medications work as intended. This list may not describe all possible interactions. Give your health care provider a list of all the medicines, herbs, non-prescription drugs, or dietary supplements you use. Also tell them if you smoke, drink alcohol, or use illegal drugs. Some items may interact with your medicine. What should I watch for while using this medication? Your condition will be monitored carefully while you are  receiving this medication. You may need blood work while taking this medication. This medication may make you feel generally unwell. This is not uncommon as chemotherapy can affect healthy cells as well as cancer cells. Report any side effects. Continue your course of treatment even though you feel ill unless your care team tells you to stop. This medication can cause serious allergic reactions. To reduce the risk, your care team may give you other medications to take before receiving this one. Be sure to follow the directions from your care team. This medication may increase your risk of getting an infection. Call your care team for advice if you get a fever, chills, sore throat, or other symptoms of a cold or flu. Do not treat yourself. Try to avoid being around people who are sick. This medication may increase your risk to bruise or bleed. Call your care team if you notice any unusual bleeding. Be careful brushing or flossing your teeth or using a toothpick because you may get an infection or bleed more easily. If you have any dental work done, tell your dentist you are receiving this medication. Talk to your care team if you or your partner may be pregnant. Serious birth defects can occur if you take this medication during pregnancy and for 6 months after the last dose. You will need a negative pregnancy test before starting this medication. Contraception is recommended while taking this medication and for 6 months after the last dose. Your care team can help you find the option that works for you. If your partner can get pregnant, use a condom during sex while taking this medication and for 3 months after the last dose. Do not breastfeed while taking this medication and for 2 weeks after the last dose. This medication may cause infertility. Talk to your care team if you are concerned about your fertility. What side effects may I notice from receiving this medication? Side effects that you should  report to your care team as soon as possible: Allergic reactions--skin rash, itching, hives, swelling of the face, lips, tongue, or throat Dry cough, shortness of breath or trouble breathing Infection--fever, chills, cough, sore throat, wounds that don't heal, pain or trouble when passing urine, general feeling of discomfort or being unwell Low red blood cell level--unusual weakness or fatigue, dizziness, headache, trouble breathing Pain, tingling, or numbness in the hands or feet Stomach pain, unusual weakness or fatigue, nausea, vomiting, diarrhea, or fever that lasts longer than expected Unusual bruising or bleeding Side effects that usually do not require medical attention (report to your care team if they continue or are bothersome): Diarrhea Fatigue Hair loss Loss of appetite Nausea Vomiting This list may not describe all possible side effects. Call your doctor for medical advice about side effects. You may report side effects to FDA at 1-800-FDA-1088. Where should I keep my medication? This medication is given in a hospital or clinic. It will not be stored at home. NOTE: This sheet is a  summary. It may not cover all possible information. If you have questions about this medicine, talk to your doctor, pharmacist, or health care provider.  2024 Elsevier/Gold Standard (2021-06-08 00:00:00)       To help prevent nausea and vomiting after your treatment, we encourage you to take your nausea medication as directed.  BELOW ARE SYMPTOMS THAT SHOULD BE REPORTED IMMEDIATELY: *FEVER GREATER THAN 100.4 F (38 C) OR HIGHER *CHILLS OR SWEATING *NAUSEA AND VOMITING THAT IS NOT CONTROLLED WITH YOUR NAUSEA MEDICATION *UNUSUAL SHORTNESS OF BREATH *UNUSUAL BRUISING OR BLEEDING *URINARY PROBLEMS (pain or burning when urinating, or frequent urination) *BOWEL PROBLEMS (unusual diarrhea, constipation, pain near the anus) TENDERNESS IN MOUTH AND THROAT WITH OR WITHOUT PRESENCE OF ULCERS (sore throat,  sores in mouth, or a toothache) UNUSUAL RASH, SWELLING OR PAIN  UNUSUAL VAGINAL DISCHARGE OR ITCHING   Items with * indicate a potential emergency and should be followed up as soon as possible or go to the Emergency Department if any problems should occur.  Please show the CHEMOTHERAPY ALERT CARD or IMMUNOTHERAPY ALERT CARD at check-in to the Emergency Department and triage nurse.  Should you have questions after your visit or need to cancel or reschedule your appointment, please contact [facility] [place] - A DEPT OF Eligha Bridegroom [facility] [phone]  and follow the prompts.  Office hours are 8:00 a.m. to 4:30 p.m. Monday - Friday. Please note that voicemails left after 4:00 p.m. may not be returned until the following business day.  We are closed weekends and major holidays. You have access to a nurse at all times for urgent questions. Please call the main number to the clinic [phone] and follow the prompts.  For any non-urgent questions, you may also contact your provider using MyChart. We now offer e-Visits for anyone 69 and older to request care online for non-urgent symptoms. For details visit [email or web address].   Also download the MyChart app! Go to the app store, search "MyChart", open the app, select [place], and log in with your MyChart username and password.

## 2023-01-31 NOTE — Progress Notes (Signed)
[facility] [address] [place] [address]    Clinic Day:  01/31/2023  Referring physician: Assunta Found, MD  Patient Care Team: Assunta Found, MD as PCP - General (Family Medicine) Jena Gauss, Gerrit Friends, MD (Gastroenterology) Antony Blackbird, MD as Consulting Physician (Radiation Oncology) Manus Rudd, MD as Consulting Physician (General Surgery) Pershing Proud, RN as Oncology Nurse Navigator Donnelly Angelica, RN as Oncology Nurse Navigator Monica Massed, MD as Medical Oncologist (Medical Oncology) Therese Sarah, RN as Oncology Nurse Navigator (Medical Oncology)   ASSESSMENT & PLAN:   Assessment: 1.  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  Noticed skin changes in the right breast 3 months ago. - Mammogram (06/19/2022): Large 8-9 cm mass in the right breast with 2 abnormal axillary lymph nodes and skin thickening. 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  1.8 cm ill-defined enhancing mass in the lateral left breast suspicious for additional malignancy.  Right breast mass involves right chest wall including pectoralis and intercostal muscles.  Prominent subcentimeter left axillary lymph nodes. - PET scan (07/12/2022): Intensely hypermetabolic right breast mass with right axillary lymph nodes.  Hypermetabolic metastatic lymph node in the posterior triangle of the right neck.  Hypermetabolic left axillary lymph nodes.  No evidence of metastatic adenopathy in the mediastinum/abdomen/pelvis.  No evidence of visceral metastasis.  Multiple skeletal metastasis:  Left sacral ala 2.5 cm, left iliac bone, L4 and T12 vertebral bodies, multiple rib lesions, left scapular lesion. - She was evaluated by Dr. Al Pimple and was recommended treatment with ribociclib and anastrozole. - Germline mutation testing: Negative. - Anastrozole and ribociclib started on 07/25/2022, discontinued on 12/13/2022 due to progression. - PET scan (11/15/2022): Mixed response to therapy with overall progression of disease with new bone metastasis and liver metastasis.  New periportal lymph nodes. - Liver biopsy (12/07/2022): Metastatic poorly differentiated adenocarcinoma consistent with breast primary.  ER +2% with moderate staining, PR 0%, HER2 (1+). - Guardant360: APC G1312, T p53, MYC amplification, FGF R2 amplification, EGFR amplification - PD-L1 (22 C3): 0% - Abraxane day 1, day 8 every 21 days started on 12/21/2022, carboplatin AUC on day 1 and day 8 added with cycle 2 on 01/10/2023   2.  Social/family history: - She lives at home with her husband.  Used OCP for approximately 16-18 years.  Underwent hysterectomy after endometrial ablation when she was found to have fibroids causing pain.  No family history of breast or other malignancies.  Smokes 1 pack of cigarettes per day.    Plan: 1.  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 - She had nausea lasting about 2 days after each treatment.  Denied any vomiting. - She reports feeling energetic for 2 days after each treatment, likely from dexamethasone. - Her right breast wound foul-smelling has improved. - Labs reviewed by me shows normal LFTs, creatinine.  CBC grossly normal. - She has 1+ edema of the legs which is new today.  She reports that she was on her feet all day yesterday.  If it continues to be a  problem, will consider starting her on Lasix as needed. - She may proceed with her cycle 3 without any dose modifications.  I will plan on repeating CT scans after cycle  4.  We will also check tumor markers.  RTC 3 weeks for follow-up.   2.  Bone metastasis: - Calcium is 8.4.  Will continue Zometa every 12 weeks.  Continue calcium supplements.     Orders Placed This Encounter  Procedures   Cancer antigen 15-3    Standing Status:   Future    Expected Date:   02/01/2023    Expiration Date:   02/01/2024   Cancer antigen 27.29    Standing Status:   Future    Expected Date:   02/01/2023    Expiration Date:   02/01/2024   Cancer antigen 15-3    Standing Status:   Future    Expected Date:   02/22/2023    Expiration Date:   02/22/2024   Cancer antigen 27.29    Standing Status:   Future    Expected Date:   02/22/2023    Expiration Date:   02/22/2024   Cancer antigen 15-3    Standing Status:   Future    Expected Date:   03/15/2023    Expiration Date:   03/14/2024   Cancer antigen 27.29    Standing Status:   Future    Expected Date:   03/15/2023    Expiration Date:   03/14/2024   Cancer antigen 15-3    Standing Status:   Future    Expected Date:   04/05/2023    Expiration Date:   04/04/2024   Cancer antigen 27.29    Standing Status:   Future    Expected Date:   04/05/2023    Expiration Date:   04/04/2024      Monica Soto,acting as a scribe for Monica Massed, MD.,have documented all relevant documentation on the behalf of Monica Massed, MD,as directed by  Monica Massed, MD while in the presence of Monica Massed, MD.  I, Monica Massed MD, have reviewed the above documentation for accuracy and completeness, and I agree with the above.       Monica Massed, MD   12/26/20241:48 PM  CHIEF COMPLAINT:   Diagnosis: metastatic inflammatory right breast cancer    Cancer Staging  Malignant neoplasm of upper-outer quadrant of right breast in female, estrogen receptor positive (HCC) Staging form: Breast, AJCC 8th Edition - Clinical: Stage IV (cT4, cN2, cM1, G2, ER+, PR+, HER2-) - Signed by Rachel Moulds, MD on  07/17/2022    Prior Therapy: none  Current Therapy:  Anastrozole and ribociclib    HISTORY OF PRESENT ILLNESS:   Oncology History  Malignant neoplasm of upper-outer quadrant of right breast in female, estrogen receptor positive (HCC)  06/19/2022 Mammogram   Mammogram showed large mass in the right breast which appears to measure 8 to 9 cm on ultrasound, 2 abnormal right axillary lymph node and a third borderline lymph node.  Skin thickening noted along the medial aspect of left breast which may be an extension of the marked skin thickening diffusely on the right.  Otherwise no left breast abnormalities are identified.   06/22/2022 Pathology Results   Needle core biopsy from the right breast upper outer quadrant showed grade 2 IDC, both lymph nodes with metastatic adenocarcinoma.  Prognostic showed ER 90% positive strong staining PR 70% positive strong staining Ki-67 of 20% HER2 negative by IHC 1+   07/03/2022 Initial Diagnosis  Malignant neoplasm of upper-outer quadrant of right breast in female, estrogen receptor positive (HCC)   07/04/2022 Cancer Staging   Staging form: Breast, AJCC 8th Edition - Clinical: Stage IV (cT4, cN2, cM1, G2, ER+, PR+, HER2-) - Signed by Rachel Moulds, MD on 07/17/2022 Stage prefix: Initial diagnosis Histologic grading system: 3 grade system   07/16/2022 - 07/16/2022 Chemotherapy   Patient is on Treatment Plan : BREAST ADJUVANT DOSE DENSE AC q14d / PACLitaxel q7d      Genetic Testing   [facility] Custom Panel+RNA was Negative. Report date is 07/10/2022.  The Custom Hereditary Cancers Panel offered by [facility] includes sequencing and/or deletion duplication testing of the following 43 genes: APC, ATM, AXIN2, BAP1, BARD1, BMPR1A, BRCA1, BRCA2, BRIP1, CDH1, CDK4, CDKN2A (p14ARF and p16INK4a only), CHEK2, CTNNA1, EPCAM (Deletion/duplication testing only), FH, GREM1 (promoter region duplication testing only), HOXB13, KIT, MBD4, MEN1, MLH1, MSH2, MSH3, MSH6, MUTYH, NF1,  NHTL1, PALB2, PDGFRA, PMS2, POLD1, POLE, PTEN, RAD51C, RAD51D, SMAD4, SMARCA4. STK11, TP53, TSC1, TSC2, and VHL.   12/13/2022 - 12/13/2022 Chemotherapy   Patient is on Treatment Plan : BREAST METASTATIC Fam-Trastuzumab Deruxtecan-nxki (Enhertu) (5.4) q21d     12/21/2022 -  Chemotherapy   Patient is on Treatment Plan : BREAST Paclitaxel-Albumin (Abraxane) (100) D1,8 + Carboplatin AUC2 D1,8 every 3 weeks, q21d        INTERVAL HISTORY:   Monica Soto is a 57 y.o. female presenting to clinic today for follow up of metastatic inflammatory right breast cancer. She was last seen by me on 01/10/23.  Today, she states that she is doing well overall. Her appetite level is at 100%. Her energy level is at 60%.   PAST MEDICAL HISTORY:   Past Medical History: Past Medical History:  Diagnosis Date   Depression    Diabetes mellitus without complication (HCC)    Erosive esophagitis 04/26/2010   EGD by Dr. Jena Gauss, small hiatal hernia   GERD (gastroesophageal reflux disease)    History of stomach ulcers    IBS (irritable bowel syndrome)    Diarrhea predominant   Microscopic colitis 04/26/2010   Colonoscopy by Dr. Jena Gauss, good response with Entocort   Tubular adenoma of colon 04/26/2010   Junction of descending and sigmoid 40 CM from anus    Surgical History: Past Surgical History:  Procedure Laterality Date   ABDOMINAL HYSTERECTOMY     APPENDECTOMY  2/11   Dr. Malvin Johns with a delayed closure   BREAST BIOPSY Right 06/22/2022   [place] RT BREAST BX W LOC DEV 1ST LESION IMG BX SPEC US GUIDE 06/22/2022 [facility] MAMMOGRAPHY   PORTACATH PLACEMENT N/A 07/16/2022   Procedure: INSERTION PORT-A-CATH;  Surgeon: Manus Rudd, MD;  Location: [facility];  Service: General;  Laterality: N/A;   TONSILLECTOMY     TUBAL LIGATION      Social History: Social History   Socioeconomic History   Marital status: Married    Spouse name: Not on file   Number of children: Not on file   Years of education: Not on  file   Highest education level: Not on file  Occupational History    Employer: [employer]    Comment: Third shift  Tobacco Use   Smoking status: Every Day    Current packs/day: 0.50    Types: Cigarettes   Smokeless tobacco: Never  Vaping Use   Vaping status: Never Used  Substance and Sexual Activity   Alcohol use: Yes    Alcohol/week: 1.0 standard drink of alcohol  Types: 1 Glasses of wine per week    Comment: seldom   Drug use: No   Sexual activity: Yes    Birth control/protection: Surgical  Other Topics Concern   Not on file  Social History Narrative   Not on file   Social Drivers of Health   Financial Resource Strain: Not on file  Food Insecurity: No Food Insecurity (07/04/2022)   Hunger Vital Sign    Worried About Running Out of Food in the Last Year: Never true    Ran Out of Food in the Last Year: Never true  Transportation Needs: No Transportation Needs (07/04/2022)   PRAPARE - Administrator, Civil Service (Medical): No    Lack of Transportation (Non-Medical): No  Physical Activity: Not on file  Stress: Not on file  Social Connections: Not on file  Intimate Partner Violence: Not on file    Family History: Family History  Problem Relation Age of Onset   Diabetes Mother    Coronary artery disease Mother    Healthy Father     Current Medications:  Current Outpatient Medications:    Aspirin-Acetaminophen-Caffeine (GOODY HEADACHE PO), Take 1 packet by mouth 2 (two) times daily as needed (for pain)., Disp: , Rfl:    cephALEXin (KEFLEX) 500 MG capsule, Take 1 capsule (500 mg total) by mouth 3 (three) times daily., Disp: 21 capsule, Rfl: 1   Cholecalciferol 1.25 MG (50000 UT) capsule, Take 1 capsule by mouth once a week., Disp: , Rfl:    HYDROcodone-acetaminophen (NORCO/VICODIN) 5-325 MG tablet, Take 1 tablet by mouth every 6 (six) hours as needed for moderate pain., Disp: 15 tablet, Rfl: 0   lidocaine-prilocaine (EMLA) cream, Apply to affected  area once, Disp: 30 g, Rfl: 3   ondansetron (ZOFRAN) 8 MG tablet, TAKE 1 TABLET(8 MG) BY MOUTH EVERY 8 HOURS AS NEEDED FOR NAUSEA OR VOMITING. START THIRD DAY AFTER DOXORUBICIN/ CYCLOPHOSPHAMIDE CHEMOTHERAPY, Disp: 30 tablet, Rfl: 1   prochlorperazine (COMPAZINE) 10 MG tablet, Take 1 tablet (10 mg total) by mouth every 6 (six) hours as needed for nausea or vomiting., Disp: 30 tablet, Rfl: 1   sulfamethoxazole-trimethoprim (BACTRIM DS) 800-160 MG tablet, Take 1 tablet by mouth 2 (two) times daily., Disp: 28 tablet, Rfl: 0   sulfamethoxazole-trimethoprim (BACTRIM DS) 800-160 MG tablet, Take 1 tablet by mouth 2 (two) times daily., Disp: 20 tablet, Rfl: 0 No current facility-administered medications for this visit.  Facility-Administered Medications Ordered in Other Visits:    0.9 %  sodium chloride infusion, , Intravenous, Continuous, Monica Massed, MD, Last Rate: 10 mL/hr at 01/31/23 1339, New Bag at 01/31/23 1339   CARBOplatin (PARAPLATIN) 240 mg in sodium chloride 0.9 % 100 mL chemo infusion, 240 mg, Intravenous, Once, Monica Massed, MD   heparin lock flush 100 unit/mL, 500 Units, Intracatheter, Once PRN, Monica Massed, MD   PACLitaxel-protein bound (ABRAXANE) chemo infusion 225 mg, 125 mg/m2 (Treatment Plan Recorded), Intravenous, Once, Monica Massed, MD   sodium chloride flush (NS) 0.9 % injection 10 mL, 10 mL, Intracatheter, PRN, Monica Massed, MD   Allergies: No Known Allergies  REVIEW OF SYSTEMS:   Review of Systems  Constitutional:  Negative for chills, fatigue and fever.  HENT:   Negative for lump/mass, mouth sores, nosebleeds, sore throat and trouble swallowing.   Eyes:  Negative for eye problems.  Respiratory:  Positive for cough. Negative for shortness of breath.   Cardiovascular:  Negative for chest pain, leg swelling and palpitations.  Gastrointestinal:  Negative for abdominal  pain, constipation, diarrhea, nausea and vomiting.  Genitourinary:   Negative for bladder incontinence, difficulty urinating, dysuria, frequency, hematuria and nocturia.   Musculoskeletal:  Negative for arthralgias, back pain, flank pain, myalgias and neck pain.  Skin:  Negative for itching and rash.  Neurological:  Positive for headaches. Negative for dizziness and numbness.  Hematological:  Does not bruise/bleed easily.  Psychiatric/Behavioral:  Negative for depression, sleep disturbance and suicidal ideas. The patient is not nervous/anxious.   All other systems reviewed and are negative.    VITALS:   There were no vitals taken for this visit.  Wt Readings from Last 3 Encounters:  01/17/23 163 lb 12.8 oz (74.3 kg)  01/14/23 164 lb 11.2 oz (74.7 kg)  01/10/23 167 lb 12.8 oz (76.1 kg)    There is no height or weight on file to calculate BMI.  Performance status (ECOG): 1 - Symptomatic but completely ambulatory   PHYSICAL EXAM:   Physical Exam Vitals and nursing note reviewed. Exam conducted with a chaperone present.  Constitutional:      Appearance: Normal appearance.  Cardiovascular:     Rate and Rhythm: Normal rate and regular rhythm.     Pulses: Normal pulses.     Heart sounds: Normal heart sounds.  Pulmonary:     Effort: Pulmonary effort is normal.     Breath sounds: Normal breath sounds.  Abdominal:     Palpations: Abdomen is soft. There is no hepatomegaly, splenomegaly or mass.     Tenderness: There is no abdominal tenderness.  Musculoskeletal:     Right lower leg: Edema present.     Left lower leg: Edema present.  Lymphadenopathy:     Cervical: No cervical adenopathy.     Right cervical: No superficial, deep or posterior cervical adenopathy.    Left cervical: No superficial, deep or posterior cervical adenopathy.     Upper Body:     Right upper body: No supraclavicular or axillary adenopathy.     Left upper body: No supraclavicular or axillary adenopathy.  Neurological:     General: No focal deficit present.     Mental Status:  She is alert and oriented to person, place, and time.  Psychiatric:        Mood and Affect: Mood normal.        Behavior: Behavior normal.     LABS:      Latest Ref Rng & Units 01/31/2023   12:09 PM 01/17/2023    8:33 AM 01/10/2023    8:28 AM  CBC  WBC 4.0 - 10.5 K/uL 5.4  8.0  7.2   Hemoglobin 12.0 - 15.0 g/dL 06.3  01.6  01.0   Hematocrit 36.0 - 46.0 % 35.2  38.0  38.3   Platelets 150 - 400 K/uL 245  277  266       Latest Ref Rng & Units 01/31/2023   12:09 PM 01/17/2023    8:33 AM 01/10/2023    8:28 AM  CMP  Glucose 70 - 99 mg/dL 932  355  732   BUN 6 - 20 mg/dL 11  10  8    Creatinine 0.44 - 1.00 mg/dL 2.02  5.42  7.06   Sodium 135 - 145 mmol/L 136  132  138   Potassium 3.5 - 5.1 mmol/L 3.6  4.2  3.7   Chloride 98 - 111 mmol/L 108  105  106   CO2 22 - 32 mmol/L 20  19  23    Calcium 8.9 - 10.3 mg/dL  8.4  8.3  8.4   Total Protein 6.5 - 8.1 g/dL 6.5  7.0  6.8   Total Bilirubin <1.2 mg/dL 0.4  0.4  0.5   Alkaline Phos 38 - 126 U/L 109  111  96   AST 15 - 41 U/L 21  24  24    ALT 0 - 44 U/L 15  20  14       No results found for: "CEA1", "CEA" / No results found for: "CEA1", "CEA" No results found for: "PSA1" No results found for: "WGN562" No results found for: "CAN125"  No results found for: "TOTALPROTELP", "ALBUMINELP", "A1GS", "A2GS", "BETS", "BETA2SER", "GAMS", "MSPIKE", "SPEI" No results found for: "TIBC", "FERRITIN", "IRONPCTSAT" No results found for: "LDH"   STUDIES:   No results found.

## 2023-01-31 NOTE — Patient Instructions (Signed)

## 2023-02-01 ENCOUNTER — Other Ambulatory Visit: Payer: Self-pay

## 2023-02-07 ENCOUNTER — Inpatient Hospital Stay: Payer: 59

## 2023-02-07 ENCOUNTER — Inpatient Hospital Stay: Payer: 59 | Attending: Hematology and Oncology

## 2023-02-07 ENCOUNTER — Encounter (HOSPITAL_COMMUNITY): Payer: Self-pay | Admitting: Hematology

## 2023-02-07 VITALS — BP 163/90 | HR 95 | Temp 97.2°F | Resp 18

## 2023-02-07 VITALS — BP 156/81 | HR 88 | Temp 97.5°F | Resp 18 | Wt 168.7 lb

## 2023-02-07 DIAGNOSIS — R519 Headache, unspecified: Secondary | ICD-10-CM | POA: Insufficient documentation

## 2023-02-07 DIAGNOSIS — Z8249 Family history of ischemic heart disease and other diseases of the circulatory system: Secondary | ICD-10-CM | POA: Diagnosis not present

## 2023-02-07 DIAGNOSIS — Z79899 Other long term (current) drug therapy: Secondary | ICD-10-CM | POA: Insufficient documentation

## 2023-02-07 DIAGNOSIS — R059 Cough, unspecified: Secondary | ICD-10-CM | POA: Diagnosis not present

## 2023-02-07 DIAGNOSIS — R110 Nausea: Secondary | ICD-10-CM | POA: Insufficient documentation

## 2023-02-07 DIAGNOSIS — Z90710 Acquired absence of both cervix and uterus: Secondary | ICD-10-CM | POA: Insufficient documentation

## 2023-02-07 DIAGNOSIS — Z1732 Human epidermal growth factor receptor 2 negative status: Secondary | ICD-10-CM | POA: Diagnosis not present

## 2023-02-07 DIAGNOSIS — Z170 Estrogen receptor positive status [ER+]: Secondary | ICD-10-CM | POA: Insufficient documentation

## 2023-02-07 DIAGNOSIS — Z1721 Progesterone receptor positive status: Secondary | ICD-10-CM | POA: Diagnosis not present

## 2023-02-07 DIAGNOSIS — C50411 Malignant neoplasm of upper-outer quadrant of right female breast: Secondary | ICD-10-CM | POA: Diagnosis present

## 2023-02-07 DIAGNOSIS — F17210 Nicotine dependence, cigarettes, uncomplicated: Secondary | ICD-10-CM | POA: Diagnosis not present

## 2023-02-07 DIAGNOSIS — Z5111 Encounter for antineoplastic chemotherapy: Secondary | ICD-10-CM | POA: Insufficient documentation

## 2023-02-07 DIAGNOSIS — Z95828 Presence of other vascular implants and grafts: Secondary | ICD-10-CM

## 2023-02-07 DIAGNOSIS — Z860101 Personal history of adenomatous and serrated colon polyps: Secondary | ICD-10-CM | POA: Diagnosis not present

## 2023-02-07 DIAGNOSIS — N6320 Unspecified lump in the left breast, unspecified quadrant: Secondary | ICD-10-CM | POA: Insufficient documentation

## 2023-02-07 DIAGNOSIS — E119 Type 2 diabetes mellitus without complications: Secondary | ICD-10-CM | POA: Diagnosis not present

## 2023-02-07 DIAGNOSIS — M533 Sacrococcygeal disorders, not elsewhere classified: Secondary | ICD-10-CM | POA: Diagnosis not present

## 2023-02-07 DIAGNOSIS — Z833 Family history of diabetes mellitus: Secondary | ICD-10-CM | POA: Diagnosis not present

## 2023-02-07 DIAGNOSIS — C7951 Secondary malignant neoplasm of bone: Secondary | ICD-10-CM | POA: Diagnosis not present

## 2023-02-07 DIAGNOSIS — Z79633 Long term (current) use of mitotic inhibitor: Secondary | ICD-10-CM | POA: Insufficient documentation

## 2023-02-07 DIAGNOSIS — R234 Changes in skin texture: Secondary | ICD-10-CM | POA: Insufficient documentation

## 2023-02-07 DIAGNOSIS — Z79811 Long term (current) use of aromatase inhibitors: Secondary | ICD-10-CM | POA: Insufficient documentation

## 2023-02-07 DIAGNOSIS — Z9049 Acquired absence of other specified parts of digestive tract: Secondary | ICD-10-CM | POA: Diagnosis not present

## 2023-02-07 DIAGNOSIS — Z79630 Long term (current) use of alkylating agent: Secondary | ICD-10-CM | POA: Diagnosis not present

## 2023-02-07 DIAGNOSIS — C787 Secondary malignant neoplasm of liver and intrahepatic bile duct: Secondary | ICD-10-CM | POA: Diagnosis not present

## 2023-02-07 LAB — CBC WITH DIFFERENTIAL/PLATELET
Abs Immature Granulocytes: 0.07 10*3/uL (ref 0.00–0.07)
Basophils Absolute: 0.1 10*3/uL (ref 0.0–0.1)
Basophils Relative: 1 %
Eosinophils Absolute: 0.1 10*3/uL (ref 0.0–0.5)
Eosinophils Relative: 1 %
HCT: 36.2 % (ref 36.0–46.0)
Hemoglobin: 11.7 g/dL — ABNORMAL LOW (ref 12.0–15.0)
Immature Granulocytes: 1 %
Lymphocytes Relative: 23 %
Lymphs Abs: 1.4 10*3/uL (ref 0.7–4.0)
MCH: 31.3 pg (ref 26.0–34.0)
MCHC: 32.3 g/dL (ref 30.0–36.0)
MCV: 96.8 fL (ref 80.0–100.0)
Monocytes Absolute: 0.3 10*3/uL (ref 0.1–1.0)
Monocytes Relative: 6 %
Neutro Abs: 3.9 10*3/uL (ref 1.7–7.7)
Neutrophils Relative %: 68 %
Platelets: 207 10*3/uL (ref 150–400)
RBC: 3.74 MIL/uL — ABNORMAL LOW (ref 3.87–5.11)
RDW: 15.3 % (ref 11.5–15.5)
WBC: 5.8 10*3/uL (ref 4.0–10.5)
nRBC: 0 % (ref 0.0–0.2)

## 2023-02-07 LAB — COMPREHENSIVE METABOLIC PANEL
ALT: 22 U/L (ref 0–44)
AST: 22 U/L (ref 15–41)
Albumin: 3.5 g/dL (ref 3.5–5.0)
Alkaline Phosphatase: 108 U/L (ref 38–126)
Anion gap: 10 (ref 5–15)
BUN: 9 mg/dL (ref 6–20)
CO2: 21 mmol/L — ABNORMAL LOW (ref 22–32)
Calcium: 8.3 mg/dL — ABNORMAL LOW (ref 8.9–10.3)
Chloride: 105 mmol/L (ref 98–111)
Creatinine, Ser: 0.51 mg/dL (ref 0.44–1.00)
GFR, Estimated: >60 mL/min (ref >60)
Glucose, Bld: 148 mg/dL — ABNORMAL HIGH (ref 70–99)
Potassium: 3.6 mmol/L (ref 3.5–5.1)
Sodium: 136 mmol/L (ref 135–145)
Total Bilirubin: 0.6 mg/dL (ref 0.0–1.2)
Total Protein: 6.8 g/dL (ref 6.5–8.1)

## 2023-02-07 LAB — MAGNESIUM: Magnesium: 1.7 mg/dL (ref 1.7–2.4)

## 2023-02-07 MED ORDER — SODIUM CHLORIDE 0.9 % IV SOLN: INTRAVENOUS | Status: DC

## 2023-02-07 MED ORDER — SODIUM CHLORIDE 0.9% FLUSH
10.0000 mL | INTRAVENOUS | Status: DC | PRN
  Administered 2023-02-07: 10 mL via INTRAVENOUS

## 2023-02-07 MED ORDER — HEPARIN SOD (PORK) LOCK FLUSH 100 UNIT/ML IV SOLN
500.0000 [IU] | Freq: Once | INTRAVENOUS | Status: AC | PRN
  Administered 2023-02-07: 500 [IU]

## 2023-02-07 MED ORDER — DEXAMETHASONE SODIUM PHOSPHATE 10 MG/ML IJ SOLN
10.0000 mg | Freq: Once | INTRAMUSCULAR | Status: AC
  Administered 2023-02-07: 10 mg via INTRAVENOUS
  Filled 2023-02-07: qty 1

## 2023-02-07 MED ORDER — PACLITAXEL PROTEIN-BOUND CHEMO INJECTION 100 MG
125.0000 mg/m2 | Freq: Once | INTRAVENOUS | Status: AC
  Administered 2023-02-07: 225 mg via INTRAVENOUS
  Filled 2023-02-07: qty 45

## 2023-02-07 MED ORDER — SODIUM CHLORIDE 0.9 % IV SOLN
235.0000 mg | Freq: Once | INTRAVENOUS | Status: AC
  Administered 2023-02-07: 240 mg via INTRAVENOUS
  Filled 2023-02-07: qty 24

## 2023-02-07 MED ORDER — PALONOSETRON HCL INJECTION 0.25 MG/5ML
0.2500 mg | Freq: Once | INTRAVENOUS | Status: AC
Start: 2023-02-07 — End: 2023-02-07
  Administered 2023-02-07: 0.25 mg via INTRAVENOUS
  Filled 2023-02-07: qty 5

## 2023-02-07 NOTE — Patient Instructions (Signed)
 [facility] [place] - A DEPT OF [facility]  Discharge Instructions: Thank you for choosing [facility] to provide your oncology and hematology care.  If you have a lab appointment with the Cancer Center - please note that after April 8th, 2024, all labs will be drawn in the cancer center.  You do not have to check in or register with the main entrance as you have in the past but will complete your check-in in the cancer center.  Wear comfortable clothing and clothing appropriate for easy access to any Portacath or PICC line.   We strive to give you quality time with your provider. You may need to reschedule your appointment if you arrive late (15 or more minutes).  Arriving late affects you and other patients whose appointments are after yours.  Also, if you miss three or more appointments without notifying the office, you may be dismissed from the clinic at the provider's discretion.      For prescription refill requests, have your pharmacy contact our office and allow 72 hours for refills to be completed.    Today you received the following chemotherapy and/or immunotherapy agents abraxane carboplatin      To help prevent nausea and vomiting after your treatment, we encourage you to take your nausea medication as directed.  BELOW ARE SYMPTOMS THAT SHOULD BE REPORTED IMMEDIATELY: *FEVER GREATER THAN 100.4 F (38 C) OR HIGHER *CHILLS OR SWEATING *NAUSEA AND VOMITING THAT IS NOT CONTROLLED WITH YOUR NAUSEA MEDICATION *UNUSUAL SHORTNESS OF BREATH *UNUSUAL BRUISING OR BLEEDING *URINARY PROBLEMS (pain or burning when urinating, or frequent urination) *BOWEL PROBLEMS (unusual diarrhea, constipation, pain near the anus) TENDERNESS IN MOUTH AND THROAT WITH OR WITHOUT PRESENCE OF ULCERS (sore throat, sores in mouth, or a toothache) UNUSUAL RASH, SWELLING OR PAIN  UNUSUAL VAGINAL DISCHARGE OR ITCHING   Items with * indicate a potential emergency and should be  followed up as soon as possible or go to the Emergency Department if any problems should occur.  Please show the CHEMOTHERAPY ALERT CARD or IMMUNOTHERAPY ALERT CARD at check-in to the Emergency Department and triage nurse.  Should you have questions after your visit or need to cancel or reschedule your appointment, please contact [facility] [place] - A DEPT OF [facility]igh[facility] [phone]  and follow the prompts.  Office hours are 8:00 a.m. to 4:30 p.m. Monday - Friday. Please note that voicemails left after 4:00 p.m. may not be returned until the following business day.  We are closed weekends and major holidays. You have access to a nurse at all times for urgent questions. Please call the main number to the clinic [phone] and follow the prompts.  For any non-urgent questions, you may also contact your provider using MyChart. We now offer e-Visits for anyone 72 and older to request care online for non-urgent symptoms. For details visit [email or web address].   Also download the MyChart app! Go to the app store, search "MyChart", open the app, select [place], and log in with your MyChart username and password.

## 2023-02-07 NOTE — Progress Notes (Signed)
 Patient tolerated chemotherapy with no complaints voiced.  Side effects with management reviewed with understanding verbalized.  Port site clean and dry with no bruising or swelling noted at site.  Good blood return noted before and after administration of chemotherapy.  Band aid applied.  Patient left in satisfactory condition with VSS and no s/s of distress noted. All follow ups as scheduled.   Venkat Ankney [employer]

## 2023-02-07 NOTE — Progress Notes (Signed)
Patients port flushed without difficulty.  Good blood return noted with no bruising or swelling noted at site.  VSS. Patient remains accessed for treatment.  

## 2023-02-20 ENCOUNTER — Other Ambulatory Visit: Payer: Self-pay

## 2023-02-20 NOTE — Progress Notes (Signed)
[facility] [address] [place] [address]    Clinic Day:  02/21/2023  Referring physician: Assunta Found, MD  Patient Care Team: Assunta Found, MD as PCP - General (Family Medicine) Jena Gauss, Gerrit Friends, MD (Gastroenterology) Antony Blackbird, MD as Consulting Physician (Radiation Oncology) Manus Rudd, MD as Consulting Physician (General Surgery) Pershing Proud, RN as Oncology Nurse Navigator Donnelly Angelica, RN as Oncology Nurse Navigator Doreatha Massed, MD as Medical Oncologist (Medical Oncology) Therese Sarah, RN as Oncology Nurse Navigator (Medical Oncology)   ASSESSMENT & PLAN:   Assessment: 1.  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  Noticed skin changes in the right breast 3 months ago. - Mammogram (06/19/2022): Large 8-9 cm mass in the right breast with 2 abnormal axillary lymph nodes and skin thickening. 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  1.8 cm ill-defined enhancing mass in the lateral left breast suspicious for additional malignancy.  Right breast mass involves right chest wall including pectoralis and intercostal muscles.  Prominent subcentimeter left axillary lymph nodes. - PET scan (07/12/2022): Intensely hypermetabolic right breast mass with right axillary lymph nodes.  Hypermetabolic metastatic lymph node in the posterior triangle of the right neck.  Hypermetabolic left axillary lymph nodes.  No evidence of metastatic adenopathy in the mediastinum/abdomen/pelvis.  No evidence of visceral metastasis.  Multiple skeletal metastasis:  Left sacral ala 2.5 cm, left iliac bone, L4 and T12 vertebral bodies, multiple rib lesions, left scapular lesion. - She was evaluated by Dr. Al Pimple and was recommended treatment with ribociclib and anastrozole. - Germline mutation testing: Negative. - Anastrozole and ribociclib started on 07/25/2022, discontinued on 12/13/2022 due to progression. - PET scan (11/15/2022): Mixed response to therapy with overall progression of disease with new bone metastasis and liver metastasis.  New periportal lymph nodes. - Liver biopsy (12/07/2022): Metastatic poorly differentiated adenocarcinoma consistent with breast primary.  ER +2% with moderate staining, PR 0%, HER2 (1+). - Guardant360: APC G1312, T p53, MYC amplification, FGF R2 amplification, EGFR amplification - PD-L1 (22 C3): 0% - Abraxane day 1, day 8 every 21 days started on 12/21/2022, carboplatin AUC on day 1 and day 8 added with cycle 2 on 01/10/2023 - NGS (12/07/2022): MS-stable, TMB-low, PTEN pathogenic variant, genomic LOH high (18%), HER2 (1+), PD-L1 (22 C3) CPS-10.    2.  Social/family history: - She lives at home with her husband.  Used OCP for approximately 16-18 years.  Underwent hysterectomy after endometrial ablation when she was found to have fibroids causing pain.  No family history of breast or other malignancies.  Smokes 1 pack of cigarettes per day.    Plan: 1.  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 - She had nausea on day 3 but no vomiting.  She has required 2 tablets of Compazine in the last 3 weeks.  Energy levels are improving. - Denies any tingling or numbness in the extremities. - She reports drainage in the back of the throat with sinus headache and pressure since Monday.  I will give her prescription Augmentin  twice daily for 7 days. - We discussed NGS results.  NGS results show PD-L1 22 C3 CPS score was 10.  Previously this was 0.  Hence we did not  start Keytruda at that time with Abraxane. - We reviewed labs today: Normal LFTs and creatinine.  CBC grossly normal. - She may proceed with cycle 4 of carboplatin and Abraxane today.  I will arrange for PET scan prior to next visit in 3 weeks.  Will follow-up on tumor markers. - If she has good response, I would recommend discontinuing carboplatin and adding Keytruda to Abraxane for better tolerability now that her CPS score is 10.   2.  Bone metastasis: - Calcium is 8.5 with albumin 3.7.  Continue Zometa every 12 weeks.  Continue calcium supplements.     Orders Placed This Encounter  Procedures   NM PET Image Restag (PS) Skull Base To Thigh    Standing Status:   Future    Expected Date:   03/07/2023    Expiration Date:   02/21/2024    If indicated for the ordered procedure, I authorize the administration of a radiopharmaceutical per Radiology protocol:   Yes    Is the patient pregnant?:   No    Preferred imaging location?:   [facility]      I,Helena R Teague,acting as a scribe for Doreatha Massed, MD.,have documented all relevant documentation on the behalf of Doreatha Massed, MD,as directed by  Doreatha Massed, MD while in the presence of Doreatha Massed, MD.  I, Doreatha Massed MD, have reviewed the above documentation for accuracy and completeness, and I agree with the above.       Doreatha Massed, MD   1/16/20255:07 PM  CHIEF COMPLAINT:   Diagnosis: metastatic inflammatory right breast cancer    Cancer Staging  Malignant neoplasm of upper-outer quadrant of right breast in female, estrogen receptor positive (HCC) Staging form: Breast, AJCC 8th Edition - Clinical: Stage IV (cT4, cN2, cM1, G2, ER+, PR+, HER2-) - Signed by Rachel Moulds, MD on 07/17/2022    Prior Therapy: none  Current Therapy:  Anastrozole and ribociclib    HISTORY OF PRESENT ILLNESS:   Oncology History  Malignant neoplasm of upper-outer quadrant of right breast in  female, estrogen receptor positive (HCC)  06/19/2022 Mammogram   Mammogram showed large mass in the right breast which appears to measure 8 to 9 cm on ultrasound, 2 abnormal right axillary lymph node and a third borderline lymph node.  Skin thickening noted along the medial aspect of left breast which may be an extension of the marked skin thickening diffusely on the right.  Otherwise no left breast abnormalities are identified.   06/22/2022 Pathology Results   Needle core biopsy from the right breast upper outer quadrant showed grade 2 IDC, both lymph nodes with metastatic adenocarcinoma.  Prognostic showed ER 90% positive strong staining PR 70% positive strong staining Ki-67 of 20% HER2 negative by IHC 1+   07/03/2022 Initial Diagnosis   Malignant neoplasm of upper-outer quadrant of right breast in female, estrogen receptor positive (HCC)   07/04/2022 Cancer Staging   Staging form: Breast, AJCC 8th Edition - Clinical: Stage IV (cT4, cN2, cM1, G2, ER+, PR+, HER2-) - Signed by Rachel Moulds, MD on 07/17/2022 Stage prefix: Initial diagnosis Histologic grading system: 3 grade system   07/16/2022 - 07/16/2022 Chemotherapy   Patient is on Treatment Plan : BREAST ADJUVANT DOSE DENSE AC q14d / PACLitaxel q7d      Genetic Testing  [facility] Custom Panel+RNA was Negative. Report date is 07/10/2022.  The Custom Hereditary Cancers Panel offered by [facility] includes sequencing and/or deletion duplication testing of the following 43 genes: APC, ATM, AXIN2, BAP1, BARD1, BMPR1A, BRCA1, BRCA2, BRIP1, CDH1, CDK4, CDKN2A (p14ARF and p16INK4a only), CHEK2, CTNNA1, EPCAM (Deletion/duplication testing only), FH, GREM1 (promoter region duplication testing only), HOXB13, KIT, MBD4, MEN1, MLH1, MSH2, MSH3, MSH6, MUTYH, NF1, NHTL1, PALB2, PDGFRA, PMS2, POLD1, POLE, PTEN, RAD51C, RAD51D, SMAD4, SMARCA4. STK11, TP53, TSC1, TSC2, and VHL.   12/13/2022 - 12/13/2022 Chemotherapy   Patient is on Treatment Plan : BREAST METASTATIC  Fam-Trastuzumab Deruxtecan-nxki (Enhertu) (5.4) q21d     12/21/2022 -  Chemotherapy   Patient is on Treatment Plan : BREAST Paclitaxel-Albumin (Abraxane) (100) D1,8 + Carboplatin AUC2 D1,8 every 3 weeks, q21d        INTERVAL HISTORY:   Monica Soto is a 58 y.o. female presenting to clinic today for follow up of metastatic inflammatory right breast cancer. She was last seen by me on 01/31/23.  Today, she states that she is doing well overall. Her appetite level is at 100%. Her energy level is at 70%.   She has nausea 3 days after treatment, which resolves with 2 pills of compazine. Energy levels are improving. Right breast would has significantly improved with minimal drainage and malodorous scent resolved. She denies any swellings in lower extremities, tingling/numbness, and new onset pains.   She notes she has a sinus infection with congestion, headaches, cough, and clear drainage that began on 02/18/23. She denies any fever. She has a history of sinus infections. She reports she got a new furnace at home and the air there has been dry since. She is not allergic to amoxicillin.  PAST MEDICAL HISTORY:   Past Medical History: Past Medical History:  Diagnosis Date   Depression    Diabetes mellitus without complication (HCC)    Erosive esophagitis 04/26/2010   EGD by Dr. Jena Gauss, small hiatal hernia   GERD (gastroesophageal reflux disease)    History of stomach ulcers    IBS (irritable bowel syndrome)    Diarrhea predominant   Microscopic colitis 04/26/2010   Colonoscopy by Dr. Jena Gauss, good response with Entocort   Tubular adenoma of colon 04/26/2010   Junction of descending and sigmoid 40 CM from anus    Surgical History: Past Surgical History:  Procedure Laterality Date   ABDOMINAL HYSTERECTOMY     APPENDECTOMY  2/11   Dr. Malvin Johns with a delayed closure   BREAST BIOPSY Right 06/22/2022   [place] RT BREAST BX W LOC DEV 1ST LESION IMG BX SPEC US GUIDE 06/22/2022 [facility] MAMMOGRAPHY   PORTACATH  PLACEMENT N/A 07/16/2022   Procedure: INSERTION PORT-A-CATH;  Surgeon: Manus Rudd, MD;  Location: [facility];  Service: General;  Laterality: N/A;   TONSILLECTOMY     TUBAL LIGATION      Social History: Social History   Socioeconomic History   Marital status: Married    Spouse name: Not on file   Number of children: Not on file   Years of education: Not on file   Highest education level: Not on file  Occupational History    Employer: [employer]    Comment: Third shift  Tobacco Use   Smoking status: Every Day    Current packs/day: 0.50    Types: Cigarettes   Smokeless tobacco: Never  Vaping Use   Vaping status: Never Used  Substance and Sexual Activity   Alcohol use: Yes  Alcohol/week: 1.0 standard drink of alcohol    Types: 1 Glasses of wine per week    Comment: seldom   Drug use: No   Sexual activity: Yes    Birth control/protection: Surgical  Other Topics Concern   Not on file  Social History Narrative   Not on file   Social Drivers of Health   Financial Resource Strain: Not on file  Food Insecurity: No Food Insecurity (07/04/2022)   Hunger Vital Sign    Worried About Running Out of Food in the Last Year: Never true    Ran Out of Food in the Last Year: Never true  Transportation Needs: No Transportation Needs (07/04/2022)   PRAPARE - Administrator, Civil Service (Medical): No    Lack of Transportation (Non-Medical): No  Physical Activity: Not on file  Stress: Not on file  Social Connections: Not on file  Intimate Partner Violence: Not on file    Family History: Family History  Problem Relation Age of Onset   Diabetes Mother    Coronary artery disease Mother    Healthy Father     Current Medications:  Current Outpatient Medications:    amoxicillin-clavulanate (AUGMENTIN) 875-125 MG tablet, Take 1 tablet by mouth 2 (two) times daily., Disp: 14 tablet, Rfl: 0   Aspirin-Acetaminophen-Caffeine (GOODY HEADACHE PO), Take  1 packet by mouth 2 (two) times daily as needed (for pain)., Disp: , Rfl:    cephALEXin (KEFLEX) 500 MG capsule, Take 1 capsule (500 mg total) by mouth 3 (three) times daily., Disp: 21 capsule, Rfl: 1   Cholecalciferol 1.25 MG (50000 UT) capsule, Take 1 capsule by mouth once a week., Disp: , Rfl:    HYDROcodone-acetaminophen (NORCO/VICODIN) 5-325 MG tablet, Take 1 tablet by mouth every 6 (six) hours as needed for moderate pain., Disp: 15 tablet, Rfl: 0   lidocaine-prilocaine (EMLA) cream, Apply to affected area once, Disp: 30 g, Rfl: 3   ondansetron (ZOFRAN) 8 MG tablet, TAKE 1 TABLET(8 MG) BY MOUTH EVERY 8 HOURS AS NEEDED FOR NAUSEA OR VOMITING. START THIRD DAY AFTER DOXORUBICIN/ CYCLOPHOSPHAMIDE CHEMOTHERAPY, Disp: 30 tablet, Rfl: 1   prochlorperazine (COMPAZINE) 10 MG tablet, Take 1 tablet (10 mg total) by mouth every 6 (six) hours as needed for nausea or vomiting., Disp: 30 tablet, Rfl: 1   sulfamethoxazole-trimethoprim (BACTRIM DS) 800-160 MG tablet, Take 1 tablet by mouth 2 (two) times daily., Disp: 28 tablet, Rfl: 0   sulfamethoxazole-trimethoprim (BACTRIM DS) 800-160 MG tablet, Take 1 tablet by mouth 2 (two) times daily., Disp: 20 tablet, Rfl: 0 No current facility-administered medications for this visit.  Facility-Administered Medications Ordered in Other Visits:    0.9 %  sodium chloride infusion, , Intravenous, Continuous, Doreatha Massed, MD, Stopped at 02/21/23 1350   0.9 %  sodium chloride infusion, , Intravenous, Once, Iruku, Praveena, MD   sodium chloride flush (NS) 0.9 % injection 10 mL, 10 mL, Intracatheter, PRN, Doreatha Massed, MD, 10 mL at 02/21/23 1349   Allergies: No Known Allergies  REVIEW OF SYSTEMS:   Review of Systems  Constitutional:  Negative for chills, fatigue and fever.  HENT:   Negative for lump/mass, mouth sores, nosebleeds, sore throat and trouble swallowing.   Eyes:  Negative for eye problems.  Respiratory:  Positive for cough and shortness of  breath.   Cardiovascular:  Negative for chest pain, leg swelling and palpitations.  Gastrointestinal:  Negative for abdominal pain, constipation, diarrhea, nausea and vomiting.  Genitourinary:  Negative for bladder incontinence, difficulty urinating,  dysuria, frequency, hematuria and nocturia.   Musculoskeletal:  Negative for arthralgias, back pain, flank pain, myalgias and neck pain.  Skin:  Negative for itching and rash.  Neurological:  Positive for headaches. Negative for dizziness and numbness.  Hematological:  Does not bruise/bleed easily.  Psychiatric/Behavioral:  Negative for depression, sleep disturbance and suicidal ideas. The patient is not nervous/anxious.   All other systems reviewed and are negative.    VITALS:   Pulse (!) 108.  Wt Readings from Last 3 Encounters:  02/21/23 164 lb 8 oz (74.6 kg)  02/07/23 168 lb 11.2 oz (76.5 kg)  01/31/23 169 lb (76.7 kg)    There is no height or weight on file to calculate BMI.  Performance status (ECOG): 1 - Symptomatic but completely ambulatory   PHYSICAL EXAM:   Physical Exam Vitals and nursing note reviewed. Exam conducted with a chaperone present.  Constitutional:      Appearance: Normal appearance.  Cardiovascular:     Rate and Rhythm: Normal rate and regular rhythm.     Pulses: Normal pulses.     Heart sounds: Normal heart sounds.  Pulmonary:     Effort: Pulmonary effort is normal.     Breath sounds: Normal breath sounds.  Abdominal:     Palpations: Abdomen is soft. There is no hepatomegaly, splenomegaly or mass.     Tenderness: There is no abdominal tenderness.  Musculoskeletal:     Right lower leg: No edema.     Left lower leg: No edema.  Lymphadenopathy:     Cervical: No cervical adenopathy.     Right cervical: No superficial, deep or posterior cervical adenopathy.    Left cervical: No superficial, deep or posterior cervical adenopathy.     Upper Body:     Right upper body: No supraclavicular or axillary  adenopathy.     Left upper body: No supraclavicular or axillary adenopathy.  Neurological:     General: No focal deficit present.     Mental Status: She is alert and oriented to person, place, and time.  Psychiatric:        Mood and Affect: Mood normal.        Behavior: Behavior normal.     LABS:      Latest Ref Rng & Units 02/21/2023   10:14 AM 02/07/2023   12:34 PM 01/31/2023   12:09 PM  CBC  WBC 4.0 - 10.5 K/uL 4.0  5.8  5.4   Hemoglobin 12.0 - 15.0 g/dL 14.7  82.9  56.2   Hematocrit 36.0 - 46.0 % 38.4  36.2  35.2   Platelets 150 - 400 K/uL 184  207  245       Latest Ref Rng & Units 02/21/2023   10:14 AM 02/07/2023   12:34 PM 01/31/2023   12:09 PM  CMP  Glucose 70 - 99 mg/dL 130  865  784   BUN 6 - 20 mg/dL 8  9  11    Creatinine 0.44 - 1.00 mg/dL 6.96  2.95  2.84   Sodium 135 - 145 mmol/L 133  136  136   Potassium 3.5 - 5.1 mmol/L 3.9  3.6  3.6   Chloride 98 - 111 mmol/L 104  105  108   CO2 22 - 32 mmol/L 20  21  20    Calcium 8.9 - 10.3 mg/dL 8.5  8.3  8.4   Total Protein 6.5 - 8.1 g/dL 6.9  6.8  6.5   Total Bilirubin 0.0 - 1.2 mg/dL 0.7  0.6  0.4   Alkaline Phos 38 - 126 U/L 110  108  109   AST 15 - 41 U/L 27  22  21    ALT 0 - 44 U/L 19  22  15       No results found for: "CEA1", "CEA" / No results found for: "CEA1", "CEA" No results found for: "PSA1" No results found for: "ION629" No results found for: "CAN125"  No results found for: "TOTALPROTELP", "ALBUMINELP", "A1GS", "A2GS", "BETS", "BETA2SER", "GAMS", "MSPIKE", "SPEI" No results found for: "TIBC", "FERRITIN", "IRONPCTSAT" No results found for: "LDH"   STUDIES:   No results found.

## 2023-02-21 ENCOUNTER — Inpatient Hospital Stay: Payer: 59 | Admitting: Hematology

## 2023-02-21 ENCOUNTER — Inpatient Hospital Stay: Payer: 59

## 2023-02-21 VITALS — BP 134/76 | HR 91 | Temp 99.1°F | Resp 18

## 2023-02-21 VITALS — BP 152/79 | HR 108 | Temp 98.3°F | Resp 20 | Wt 164.5 lb

## 2023-02-21 VITALS — HR 108

## 2023-02-21 DIAGNOSIS — Z5111 Encounter for antineoplastic chemotherapy: Secondary | ICD-10-CM | POA: Diagnosis not present

## 2023-02-21 DIAGNOSIS — C50411 Malignant neoplasm of upper-outer quadrant of right female breast: Secondary | ICD-10-CM

## 2023-02-21 DIAGNOSIS — Z95828 Presence of other vascular implants and grafts: Secondary | ICD-10-CM

## 2023-02-21 DIAGNOSIS — Z170 Estrogen receptor positive status [ER+]: Secondary | ICD-10-CM

## 2023-02-21 LAB — CBC WITH DIFFERENTIAL/PLATELET
Abs Immature Granulocytes: 0.03 10*3/uL (ref 0.00–0.07)
Basophils Absolute: 0 10*3/uL (ref 0.0–0.1)
Basophils Relative: 1 %
Eosinophils Absolute: 0 10*3/uL (ref 0.0–0.5)
Eosinophils Relative: 1 %
HCT: 38.4 % (ref 36.0–46.0)
Hemoglobin: 12.4 g/dL (ref 12.0–15.0)
Immature Granulocytes: 1 %
Lymphocytes Relative: 30 %
Lymphs Abs: 1.2 10*3/uL (ref 0.7–4.0)
MCH: 31.4 pg (ref 26.0–34.0)
MCHC: 32.3 g/dL (ref 30.0–36.0)
MCV: 97.2 fL (ref 80.0–100.0)
Monocytes Absolute: 0.5 10*3/uL (ref 0.1–1.0)
Monocytes Relative: 12 %
Neutro Abs: 2.2 10*3/uL (ref 1.7–7.7)
Neutrophils Relative %: 55 %
Platelets: 184 10*3/uL (ref 150–400)
RBC: 3.95 MIL/uL (ref 3.87–5.11)
RDW: 16.9 % — ABNORMAL HIGH (ref 11.5–15.5)
WBC: 4 10*3/uL (ref 4.0–10.5)
nRBC: 0 % (ref 0.0–0.2)

## 2023-02-21 LAB — COMPREHENSIVE METABOLIC PANEL
ALT: 19 U/L (ref 0–44)
AST: 27 U/L (ref 15–41)
Albumin: 3.7 g/dL (ref 3.5–5.0)
Alkaline Phosphatase: 110 U/L (ref 38–126)
Anion gap: 9 (ref 5–15)
BUN: 8 mg/dL (ref 6–20)
CO2: 20 mmol/L — ABNORMAL LOW (ref 22–32)
Calcium: 8.5 mg/dL — ABNORMAL LOW (ref 8.9–10.3)
Chloride: 104 mmol/L (ref 98–111)
Creatinine, Ser: 0.62 mg/dL (ref 0.44–1.00)
GFR, Estimated: >60 mL/min (ref >60)
Glucose, Bld: 144 mg/dL — ABNORMAL HIGH (ref 70–99)
Potassium: 3.9 mmol/L (ref 3.5–5.1)
Sodium: 133 mmol/L — ABNORMAL LOW (ref 135–145)
Total Bilirubin: 0.7 mg/dL (ref 0.0–1.2)
Total Protein: 6.9 g/dL (ref 6.5–8.1)

## 2023-02-21 LAB — MAGNESIUM: Magnesium: 1.9 mg/dL (ref 1.7–2.4)

## 2023-02-21 MED ORDER — PALONOSETRON HCL INJECTION 0.25 MG/5ML
0.2500 mg | Freq: Once | INTRAVENOUS | Status: AC
  Administered 2023-02-21: 0.25 mg via INTRAVENOUS
  Filled 2023-02-21: qty 5

## 2023-02-21 MED ORDER — ZOLEDRONIC ACID 4 MG/100ML IV SOLN: 4.0000 mg | Freq: Once | INTRAVENOUS | Status: DC

## 2023-02-21 MED ORDER — SODIUM CHLORIDE 0.9% FLUSH
10.0000 mL | INTRAVENOUS | Status: DC | PRN
  Administered 2023-02-21: 10 mL via INTRAVENOUS

## 2023-02-21 MED ORDER — DEXAMETHASONE SODIUM PHOSPHATE 10 MG/ML IJ SOLN
10.0000 mg | Freq: Once | INTRAMUSCULAR | Status: AC
  Administered 2023-02-21: 10 mg via INTRAVENOUS
  Filled 2023-02-21: qty 1

## 2023-02-21 MED ORDER — SODIUM CHLORIDE 0.9 % IV SOLN: INTRAVENOUS | Status: DC

## 2023-02-21 MED ORDER — HEPARIN SOD (PORK) LOCK FLUSH 100 UNIT/ML IV SOLN
500.0000 [IU] | Freq: Once | INTRAVENOUS | Status: AC | PRN
  Administered 2023-02-21: 500 [IU]

## 2023-02-21 MED ORDER — PACLITAXEL PROTEIN-BOUND CHEMO INJECTION 100 MG
125.0000 mg/m2 | Freq: Once | INTRAVENOUS | Status: AC
Start: 2023-02-21 — End: 2023-02-21
  Administered 2023-02-21: 225 mg via INTRAVENOUS
  Filled 2023-02-21: qty 45

## 2023-02-21 MED ORDER — AMOXICILLIN-POT CLAVULANATE 875-125 MG PO TABS: 1.0000 | ORAL_TABLET | Freq: Two times a day (BID) | ORAL | 0 refills | Status: DC

## 2023-02-21 MED ORDER — CARBOPLATIN CHEMO INJECTION 450 MG/45ML
235.0000 mg | Freq: Once | INTRAVENOUS | Status: AC
  Administered 2023-02-21: 240 mg via INTRAVENOUS
  Filled 2023-02-21: qty 24

## 2023-02-21 MED ORDER — SODIUM CHLORIDE 0.9 % IV SOLN: Freq: Once | INTRAVENOUS | Status: DC

## 2023-02-21 MED ORDER — SODIUM CHLORIDE 0.9% FLUSH
10.0000 mL | INTRAVENOUS | Status: DC | PRN
Start: 2023-02-21 — End: 2023-02-21
  Administered 2023-02-21: 10 mL

## 2023-02-21 NOTE — Patient Instructions (Addendum)
[facility] at [facility] Discharge Instructions   You were seen and examined today by Dr. Ellin Saba.  He reviewed the results of your lab work which are normal/stable.   He reviewed the results of the special testing we sent on your biopsy tissue. It is showing you have several options in the future should we need to use them. It is also showing that your PD-L1 is 10. This means we can use the [facility] Dr. Kirtland Bouchard has talked with you about in the past.   We will proceed with your treatment today.   Return as scheduled.    Thank you for choosing [facility] at [facility] to provide your oncology and hematology care.  To afford each patient quality time with our provider, please arrive at least 15 minutes before your scheduled appointment time.   If you have a lab appointment with the Cancer Center please come in thru the Main Entrance and check in at the main information desk.  You need to re-schedule your appointment should you arrive 10 or more minutes late.  We strive to give you quality time with our providers, and arriving late affects you and other patients whose appointments are after yours.  Also, if you no show three or more times for appointments you may be dismissed from the clinic at the providers discretion.     Again, thank you for choosing [facility].  Our hope is that these requests will decrease the amount of time that you wait before being seen by our physicians.       _____________________________________________________________  Should you have questions after your visit to [facility], please contact our office at [phone] and follow the prompts.  Our office hours are 8:00 a.m. and 4:30 p.m. Monday - Friday.  Please note that voicemails left after 4:00 p.m. may not be returned until the following business day.  We are closed weekends and major holidays.  You do have access to a nurse 24-7, just call  the main number to the clinic [phone] and do not press any options, hold on the line and a nurse will answer the phone.    For prescription refill requests, have your pharmacy contact our office and allow 72 hours.    Due to Covid, you will need to wear a mask upon entering the hospital. If you do not have a mask, a mask will be given to you at the Main Entrance upon arrival. For doctor visits, patients may have 1 support person age 62 or older with them. For treatment visits, patients can not have anyone with them due to social distancing guidelines and our immunocompromised population.

## 2023-02-21 NOTE — Progress Notes (Signed)
Patients port flushed without difficulty.  Good blood return noted with no bruising or swelling noted at site.  Patient remains accessed for treatment.  

## 2023-02-21 NOTE — Progress Notes (Signed)
Patient has been examined by Dr. Katragadda. Vital signs and labs have been reviewed by MD - ANC, Creatinine, LFTs, hemoglobin, and platelets are within treatment parameters per M.D. - pt may proceed with treatment.  Primary RN and pharmacy notified.  

## 2023-02-21 NOTE — Progress Notes (Signed)
Patient presents today for Abraxane/Carboplatin infusion. Patient is in satisfactory condition with no new complaints voiced. Vital signs are stable. Patient's HR noted to be 108.Labs reviewed by Dr. Ellin Saba during the office visit and all labs are within treatment parameters.  We will proceed with treatment per MD orders.   Treatment given today per MD orders. Tolerated infusion without adverse affects. Vital signs stable. No complaints at this time. Discharged from clinic ambulatory in stable condition. Alert and oriented x 3. F/U with [facility] as scheduled.

## 2023-02-22 LAB — CANCER ANTIGEN 15-3: CA 15-3: 369 U/mL — ABNORMAL HIGH (ref 0.0–25.0)

## 2023-02-22 LAB — CANCER ANTIGEN 27.29: CA 27.29: 475.2 U/mL — ABNORMAL HIGH (ref 0.0–38.6)

## 2023-02-26 ENCOUNTER — Other Ambulatory Visit: Payer: Self-pay

## 2023-02-28 ENCOUNTER — Inpatient Hospital Stay: Payer: 59

## 2023-02-28 VITALS — BP 147/80 | HR 87 | Temp 98.3°F | Resp 18

## 2023-02-28 DIAGNOSIS — Z5111 Encounter for antineoplastic chemotherapy: Secondary | ICD-10-CM | POA: Diagnosis not present

## 2023-02-28 DIAGNOSIS — C50411 Malignant neoplasm of upper-outer quadrant of right female breast: Secondary | ICD-10-CM

## 2023-02-28 LAB — COMPREHENSIVE METABOLIC PANEL
ALT: 20 U/L (ref 0–44)
AST: 25 U/L (ref 15–41)
Albumin: 3.6 g/dL (ref 3.5–5.0)
Alkaline Phosphatase: 93 U/L (ref 38–126)
Anion gap: 7 (ref 5–15)
BUN: 9 mg/dL (ref 6–20)
CO2: 22 mmol/L (ref 22–32)
Calcium: 8.4 mg/dL — ABNORMAL LOW (ref 8.9–10.3)
Chloride: 108 mmol/L (ref 98–111)
Creatinine, Ser: 0.57 mg/dL (ref 0.44–1.00)
GFR, Estimated: >60 mL/min (ref >60)
Glucose, Bld: 135 mg/dL — ABNORMAL HIGH (ref 70–99)
Potassium: 3.8 mmol/L (ref 3.5–5.1)
Sodium: 137 mmol/L (ref 135–145)
Total Bilirubin: 0.6 mg/dL (ref 0.0–1.2)
Total Protein: 6.4 g/dL — ABNORMAL LOW (ref 6.5–8.1)

## 2023-02-28 LAB — CBC WITH DIFFERENTIAL/PLATELET
Abs Immature Granulocytes: 0.06 10*3/uL (ref 0.00–0.07)
Basophils Absolute: 0 10*3/uL (ref 0.0–0.1)
Basophils Relative: 1 %
Eosinophils Absolute: 0.1 10*3/uL (ref 0.0–0.5)
Eosinophils Relative: 1 %
HCT: 33.3 % — ABNORMAL LOW (ref 36.0–46.0)
Hemoglobin: 11.2 g/dL — ABNORMAL LOW (ref 12.0–15.0)
Immature Granulocytes: 1 %
Lymphocytes Relative: 26 %
Lymphs Abs: 1.4 10*3/uL (ref 0.7–4.0)
MCH: 32.1 pg (ref 26.0–34.0)
MCHC: 33.6 g/dL (ref 30.0–36.0)
MCV: 95.4 fL (ref 80.0–100.0)
Monocytes Absolute: 0.2 10*3/uL (ref 0.1–1.0)
Monocytes Relative: 4 %
Neutro Abs: 3.5 10*3/uL (ref 1.7–7.7)
Neutrophils Relative %: 67 %
Platelets: 180 10*3/uL (ref 150–400)
RBC: 3.49 MIL/uL — ABNORMAL LOW (ref 3.87–5.11)
RDW: 16.5 % — ABNORMAL HIGH (ref 11.5–15.5)
WBC: 5.2 10*3/uL (ref 4.0–10.5)
nRBC: 0 % (ref 0.0–0.2)

## 2023-02-28 LAB — MAGNESIUM: Magnesium: 1.6 mg/dL — ABNORMAL LOW (ref 1.7–2.4)

## 2023-02-28 MED ORDER — SODIUM CHLORIDE 0.9 % IV SOLN: INTRAVENOUS | Status: DC

## 2023-02-28 MED ORDER — PACLITAXEL PROTEIN-BOUND CHEMO INJECTION 100 MG
125.0000 mg/m2 | Freq: Once | INTRAVENOUS | Status: AC
Start: 2023-02-28 — End: 2023-02-28
  Administered 2023-02-28: 225 mg via INTRAVENOUS
  Filled 2023-02-28: qty 45

## 2023-02-28 MED ORDER — SODIUM CHLORIDE 0.9% FLUSH
10.0000 mL | INTRAVENOUS | Status: DC | PRN
  Administered 2023-02-28: 10 mL via INTRAVENOUS

## 2023-02-28 MED ORDER — PALONOSETRON HCL INJECTION 0.25 MG/5ML
0.2500 mg | Freq: Once | INTRAVENOUS | Status: AC
Start: 2023-02-28 — End: 2023-02-28
  Administered 2023-02-28: 0.25 mg via INTRAVENOUS
  Filled 2023-02-28: qty 5

## 2023-02-28 MED ORDER — MAGNESIUM SULFATE 2 GM/50ML IV SOLN
2.0000 g | Freq: Once | INTRAVENOUS | Status: AC
  Administered 2023-02-28: 2 g via INTRAVENOUS
  Filled 2023-02-28: qty 50

## 2023-02-28 MED ORDER — DEXAMETHASONE SODIUM PHOSPHATE 10 MG/ML IJ SOLN
10.0000 mg | Freq: Once | INTRAMUSCULAR | Status: AC
  Administered 2023-02-28: 10 mg via INTRAVENOUS
  Filled 2023-02-28: qty 1

## 2023-02-28 MED ORDER — CARBOPLATIN CHEMO INJECTION 450 MG/45ML
235.0000 mg | Freq: Once | INTRAVENOUS | Status: AC
  Administered 2023-02-28: 240 mg via INTRAVENOUS
  Filled 2023-02-28: qty 24

## 2023-02-28 MED ORDER — SODIUM CHLORIDE 0.9% FLUSH
10.0000 mL | INTRAVENOUS | Status: DC | PRN
Start: 2023-02-28 — End: 2023-02-28
  Administered 2023-02-28: 10 mL

## 2023-02-28 MED ORDER — HEPARIN SOD (PORK) LOCK FLUSH 100 UNIT/ML IV SOLN
500.0000 [IU] | Freq: Once | INTRAVENOUS | Status: AC | PRN
  Administered 2023-02-28: 500 [IU]

## 2023-02-28 NOTE — Progress Notes (Signed)
Patient presents today for Abraxane and Carboplatin infusion. Vital signs within parameters for treatment. Labs pending.   Labs within parameters for treatment. Magnesium 1.6. Standing orders followed. Patient will receive 2 grams of Magnesium Sulfate today.   Treatment given today per MD orders. Tolerated infusion without adverse affects. Vital signs stable. No complaints at this time. Discharged from clinic ambulatory in stable condition. Alert and oriented x 3. F/U with [facility] as scheduled.

## 2023-02-28 NOTE — Patient Instructions (Signed)
[facility] [place] - A DEPT OF [facility]  Discharge Instructions: Thank you for choosing [facility] to provide your oncology and hematology care.  If you have a lab appointment with the Cancer Center - please note that after April 8th, 2024, all labs will be drawn in the cancer center.  You do not have to check in or register with the main entrance as you have in the past but will complete your check-in in the cancer center.  Wear comfortable clothing and clothing appropriate for easy access to any Portacath or PICC line.   We strive to give you quality time with your provider. You may need to reschedule your appointment if you arrive late (15 or more minutes).  Arriving late affects you and other patients whose appointments are after yours.  Also, if you miss three or more appointments without notifying the office, you may be dismissed from the clinic at the provider's discretion.      For prescription refill requests, have your pharmacy contact our office and allow 72 hours for refills to be completed.    Today you received the following chemotherapy and/or immunotherapy agents abraxane and carboplatin. Carboplatin Injection What is this medication? CARBOPLATIN (KAR boe pla tin) treats some types of cancer. It works by slowing down the growth of cancer cells. This medicine may be used for other purposes; ask your health care provider or pharmacist if you have questions. COMMON BRAND NAME(S): Paraplatin What should I tell my care team before I take this medication? They need to know if you have any of these conditions: Blood disorders Hearing problems Kidney disease Recent or ongoing radiation therapy An unusual or allergic reaction to carboplatin, cisplatin, other medications, foods, dyes, or preservatives Pregnant or trying to get pregnant Breast-feeding How should I use this medication? This medication is injected into a vein. It is given by  your care team in a hospital or clinic setting. Talk to your care team about the use of this medication in children. Special care may be needed. Overdosage: If you think you have taken too much of this medicine contact a poison control center or emergency room at once. NOTE: This medicine is only for you. Do not share this medicine with others. What if I miss a dose? Keep appointments for follow-up doses. It is important not to miss your dose. Call your care team if you are unable to keep an appointment. What may interact with this medication? Medications for seizures Some antibiotics, such as amikacin, gentamicin, neomycin, streptomycin, tobramycin Vaccines This list may not describe all possible interactions. Give your health care provider a list of all the medicines, herbs, non-prescription drugs, or dietary supplements you use. Also tell them if you smoke, drink alcohol, or use illegal drugs. Some items may interact with your medicine. What should I watch for while using this medication? Your condition will be monitored carefully while you are receiving this medication. You may need blood work while taking this medication. This medication may make you feel generally unwell. This is not uncommon, as chemotherapy can affect healthy cells as well as cancer cells. Report any side effects. Continue your course of treatment even though you feel ill unless your care team tells you to stop. In some cases, you may be given additional medications to help with side effects. Follow all directions for their use. This medication may increase your risk of getting an infection. Call your care team for advice  if you get a fever, chills, sore throat, or other symptoms of a cold or flu. Do not treat yourself. Try to avoid being around people who are sick. Avoid taking medications that contain aspirin, acetaminophen, ibuprofen, naproxen, or ketoprofen unless instructed by your care team. These medications may hide a  fever. Be careful brushing or flossing your teeth or using a toothpick because you may get an infection or bleed more easily. If you have any dental work done, tell your dentist you are receiving this medication. Talk to your care team if you wish to become pregnant or think you might be pregnant. This medication can cause serious birth defects. Talk to your care team about effective forms of contraception. Do not breast-feed while taking this medication. What side effects may I notice from receiving this medication? Side effects that you should report to your care team as soon as possible: Allergic reactions--skin rash, itching, hives, swelling of the face, lips, tongue, or throat Infection--fever, chills, cough, sore throat, wounds that don't heal, pain or trouble when passing urine, general feeling of discomfort or being unwell Low red blood cell level--unusual weakness or fatigue, dizziness, headache, trouble breathing Pain, tingling, or numbness in the hands or feet, muscle weakness, change in vision, confusion or trouble speaking, loss of balance or coordination, trouble walking, seizures Unusual bruising or bleeding Side effects that usually do not require medical attention (report to your care team if they continue or are bothersome): Hair loss Nausea Unusual weakness or fatigue Vomiting This list may not describe all possible side effects. Call your doctor for medical advice about side effects. You may report side effects to FDA at 1-800-FDA-1088. Where should I keep my medication? This medication is given in a hospital or clinic. It will not be stored at home. NOTE: This sheet is a summary. It may not cover all possible information. If you have questions about this medicine, talk to your doctor, pharmacist, or health care provider.  2024 Elsevier/Gold Standard (2021-05-16 00:00:00)Paclitaxel Nanoparticle Albumin-Bound Injection What is this medication? NANOPARTICLE ALBUMIN-BOUND  PACLITAXEL (Na no PAHR ti kuhl al BYOO muhn-bound PAK li TAX el) treats some types of cancer. It works by slowing down the growth of cancer cells. This medicine may be used for other purposes; ask your health care provider or pharmacist if you have questions. COMMON BRAND NAME(S): Abraxane What should I tell my care team before I take this medication? They need to know if you have any of these conditions: Liver disease Low white blood cell levels An unusual or allergic reaction to paclitaxel, albumin, other medications, foods, dyes, or preservatives If you or your partner are pregnant or trying to get pregnant Breast-feeding How should I use this medication? This medication is injected into a vein. It is given by your care team in a hospital or clinic setting. Talk to your care team about the use of this medication in children. Special care may be needed. Overdosage: If you think you have taken too much of this medicine contact a poison control center or emergency room at once. NOTE: This medicine is only for you. Do not share this medicine with others. What if I miss a dose? Keep appointments for follow-up doses. It is important not to miss your dose. Call your care team if you are unable to keep an appointment. What may interact with this medication? Other medications may affect the way this medication works. Talk with your care team about all of the medications you take. They  may suggest changes to your treatment plan to lower the risk of side effects and to make sure your medications work as intended. This list may not describe all possible interactions. Give your health care provider a list of all the medicines, herbs, non-prescription drugs, or dietary supplements you use. Also tell them if you smoke, drink alcohol, or use illegal drugs. Some items may interact with your medicine. What should I watch for while using this medication? Your condition will be monitored carefully while you are  receiving this medication. You may need blood work while taking this medication. This medication may make you feel generally unwell. This is not uncommon as chemotherapy can affect healthy cells as well as cancer cells. Report any side effects. Continue your course of treatment even though you feel ill unless your care team tells you to stop. This medication can cause serious allergic reactions. To reduce the risk, your care team may give you other medications to take before receiving this one. Be sure to follow the directions from your care team. This medication may increase your risk of getting an infection. Call your care team for advice if you get a fever, chills, sore throat, or other symptoms of a cold or flu. Do not treat yourself. Try to avoid being around people who are sick. This medication may increase your risk to bruise or bleed. Call your care team if you notice any unusual bleeding. Be careful brushing or flossing your teeth or using a toothpick because you may get an infection or bleed more easily. If you have any dental work done, tell your dentist you are receiving this medication. Talk to your care team if you or your partner may be pregnant. Serious birth defects can occur if you take this medication during pregnancy and for 6 months after the last dose. You will need a negative pregnancy test before starting this medication. Contraception is recommended while taking this medication and for 6 months after the last dose. Your care team can help you find the option that works for you. If your partner can get pregnant, use a condom during sex while taking this medication and for 3 months after the last dose. Do not breastfeed while taking this medication and for 2 weeks after the last dose. This medication may cause infertility. Talk to your care team if you are concerned about your fertility. What side effects may I notice from receiving this medication? Side effects that you should  report to your care team as soon as possible: Allergic reactions--skin rash, itching, hives, swelling of the face, lips, tongue, or throat Dry cough, shortness of breath or trouble breathing Infection--fever, chills, cough, sore throat, wounds that don't heal, pain or trouble when passing urine, general feeling of discomfort or being unwell Low red blood cell level--unusual weakness or fatigue, dizziness, headache, trouble breathing Pain, tingling, or numbness in the hands or feet Stomach pain, unusual weakness or fatigue, nausea, vomiting, diarrhea, or fever that lasts longer than expected Unusual bruising or bleeding Side effects that usually do not require medical attention (report to your care team if they continue or are bothersome): Diarrhea Fatigue Hair loss Loss of appetite Nausea Vomiting This list may not describe all possible side effects. Call your doctor for medical advice about side effects. You may report side effects to FDA at 1-800-FDA-1088. Where should I keep my medication? This medication is given in a hospital or clinic. It will not be stored at home. NOTE: This sheet is a  summary. It may not cover all possible information. If you have questions about this medicine, talk to your doctor, pharmacist, or health care provider.  2024 Elsevier/Gold Standard (2021-06-08 00:00:00)      To help prevent nausea and vomiting after your treatment, we encourage you to take your nausea medication as directed.  BELOW ARE SYMPTOMS THAT SHOULD BE REPORTED IMMEDIATELY: *FEVER GREATER THAN 100.4 F (38 C) OR HIGHER *CHILLS OR SWEATING *NAUSEA AND VOMITING THAT IS NOT CONTROLLED WITH YOUR NAUSEA MEDICATION *UNUSUAL SHORTNESS OF BREATH *UNUSUAL BRUISING OR BLEEDING *URINARY PROBLEMS (pain or burning when urinating, or frequent urination) *BOWEL PROBLEMS (unusual diarrhea, constipation, pain near the anus) TENDERNESS IN MOUTH AND THROAT WITH OR WITHOUT PRESENCE OF ULCERS (sore throat,  sores in mouth, or a toothache) UNUSUAL RASH, SWELLING OR PAIN  UNUSUAL VAGINAL DISCHARGE OR ITCHING   Items with * indicate a potential emergency and should be followed up as soon as possible or go to the Emergency Department if any problems should occur.  Please show the CHEMOTHERAPY ALERT CARD or IMMUNOTHERAPY ALERT CARD at check-in to the Emergency Department and triage nurse.  Should you have questions after your visit or need to cancel or reschedule your appointment, please contact [facility] [place] - A DEPT OF Eligha Bridegroom [facility] [phone]  and follow the prompts.  Office hours are 8:00 a.m. to 4:30 p.m. Monday - Friday. Please note that voicemails left after 4:00 p.m. may not be returned until the following business day.  We are closed weekends and major holidays. You have access to a nurse at all times for urgent questions. Please call the main number to the clinic [phone] and follow the prompts.  For any non-urgent questions, you may also contact your provider using MyChart. We now offer e-Visits for anyone 15 and older to request care online for non-urgent symptoms. For details visit [email or web address].   Also download the MyChart app! Go to the app store, search "MyChart", open the app, select , and log in with your MyChart username and password.

## 2023-03-04 ENCOUNTER — Ambulatory Visit (HOSPITAL_COMMUNITY)
Admission: RE | Admit: 2023-03-04 | Discharge: 2023-03-04 | Disposition: A | Discharge status: Home / Self Care | Payer: 59 | Source: Ambulatory Visit | Attending: Hematology | Admitting: Hematology

## 2023-03-04 DIAGNOSIS — C50411 Malignant neoplasm of upper-outer quadrant of right female breast: Secondary | ICD-10-CM | POA: Diagnosis present

## 2023-03-04 DIAGNOSIS — Z170 Estrogen receptor positive status [ER+]: Secondary | ICD-10-CM | POA: Insufficient documentation

## 2023-03-04 LAB — GLUCOSE, CAPILLARY: Glucose-Capillary: 116 mg/dL — ABNORMAL HIGH (ref 70–99)

## 2023-03-04 MED ORDER — FLUDEOXYGLUCOSE F - 18 (FDG) INJECTION
8.0000 | Freq: Once | INTRAVENOUS | Status: AC | PRN
  Administered 2023-03-04: 8.27 via INTRAVENOUS

## 2023-03-11 ENCOUNTER — Telehealth: Payer: Self-pay | Admitting: *Deleted

## 2023-03-11 ENCOUNTER — Other Ambulatory Visit: Payer: Self-pay | Admitting: *Deleted

## 2023-03-11 MED ORDER — AMOXICILLIN-POT CLAVULANATE 875-125 MG PO TABS: 1.0000 | ORAL_TABLET | Freq: Two times a day (BID) | ORAL | 0 refills | Status: DC

## 2023-03-11 NOTE — Telephone Encounter (Signed)
Patient called stating she has had congestion, cough, diarrhea, sinus drainage since Wednesday.  Sinus discharge is green and thick.  Clear sputum with cough.  Denies fever at this time.  Recommended she do COVID/Flu test, which is all is negative. Per Dr. Ellin Saba, Augmentin sent to pharmacy.  Patient made aware and encouraged to push fluids.  She will be evaluated in office Thursday.

## 2023-03-13 NOTE — Progress Notes (Signed)
 [facility] [address] [place] [address]    Clinic Day:  03/14/2023  Referring physician: [name], MD  Patient Care Team: [name], MD as PCP - General (Family Medicine) Shaaron, Lamar HERO, MD (Gastroenterology) Shannon Agent, MD as Consulting Physician (Radiation Oncology) [name], MD as Consulting Physician (General Surgery) Glean Stephane BROCKS, RN as Oncology Nurse Navigator Tyree Nanetta SAILOR, RN as Oncology Nurse Navigator [name], MD as Medical Oncologist (Medical Oncology) Celestia Joesph SQUIBB, RN as Oncology Nurse Navigator (Medical Oncology)   ASSESSMENT & PLAN:   Assessment: 1.  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  Noticed skin changes in the right breast 3 months ago. - Mammogram (06/19/2022): Large 8-9 cm mass in the right breast with 2 abnormal axillary lymph nodes and skin thickening. 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  1.8 cm ill-defined enhancing mass in the lateral left breast suspicious for additional malignancy.  Right breast mass involves right chest wall including pectoralis and intercostal muscles.  Prominent subcentimeter left axillary lymph nodes. - PET scan (07/12/2022): Intensely hypermetabolic right breast mass with right axillary lymph nodes.  Hypermetabolic metastatic lymph node in the posterior triangle of the right neck.  Hypermetabolic left axillary lymph nodes.  No evidence of metastatic adenopathy in the mediastinum/abdomen/pelvis.  No evidence of visceral metastasis.  Multiple skeletal metastasis:  Left sacral ala 2.5 cm, left iliac bone, L4 and T12 vertebral bodies, multiple rib lesions, left scapular lesion. - She was evaluated by Dr. Loretha and was recommended treatment with ribociclib  and anastrozole . - Germline mutation testing: Negative. - Anastrozole  and ribociclib  started on 07/25/2022, discontinued on 12/13/2022 due to progression. - PET scan (11/15/2022): Mixed response to therapy with overall progression of disease with new bone metastasis and liver metastasis.  New periportal lymph nodes. - Liver biopsy (12/07/2022): Metastatic poorly differentiated adenocarcinoma consistent with breast primary.  ER +2% with moderate staining, PR 0%, HER2 (1+). - Guardant360: APC G1312, T p53, MYC amplification, FGF R2 amplification, EGFR amplification - PD-L1 (22 C3): 0% - Abraxane  day 1, day 8 every 21 days started on 12/21/2022, carboplatin  AUC on day 1 and day 8 added with cycle 2 on 01/10/2023 - NGS (12/07/2022): MS-stable, TMB-low, PTEN pathogenic variant, genomic LOH high (18%), HER2 (1+), PD-L1 (22 C3) CPS-10.    2.  Social/family history: - She lives at home with her husband.  Used OCP for approximately 16-18 years.  Underwent hysterectomy after endometrial ablation when she was found to have fibroids causing pain.  No family history of breast or other malignancies.  Smokes 1 pack of cigarettes per day.    Plan: 1.  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  She has tolerated them very well.  She is recovering from sinus infection.  She has used antibiotics. - We reviewed PET scan from 03/04/2023.  She has gotten mixed response with new hypermetabolic focus in the gallbladder fossa.  Mildly hypermetabolic porta hepatis adenopathy seen. - We discussed options including adding Keytruda  to the chemotherapy regimen as her CPS was found  to be 10 normal NGS testing.  Also talked about next line Therapy with Sacituzumab/Enhertu. - We discussed  side effects of Keytruda  in detail.  We will like to go to the current regimen and rescan her in months. - Reviewed labs today: Normal LFTs.  CBC grossly normal.  CA 15-3 was 369 and CA 27-29 is 475. - She may proceed with her next cycle of Keytruda  today.  RTC 3 weeks for follow-up.   2.  Bone metastasis: - Continue Zometa  every 12 weeks.  Continue calcium supplements.  Calcium is 8.4 and creatinine normal.     No orders of the defined types were placed in this encounter.     [name],acting as a neurosurgeon for Alean Stands, MD.,have documented all relevant documentation on the behalf of Alean Stands, MD,as directed by  Alean Stands, MD while in the presence of Alean Stands, MD.  I, Alean Stands MD, have reviewed the above documentation for accuracy and completeness, and I agree with the above.       Alean Stands, MD   2/6/20255:21 PM  CHIEF COMPLAINT:   Diagnosis: metastatic inflammatory right breast cancer    Cancer Staging  Malignant neoplasm of upper-outer quadrant of right breast in female, estrogen receptor positive (HCC) Staging form: Breast, AJCC 8th Edition - Clinical: Stage IV (cT4, cN2, cM1, G2, ER+, PR+, HER2-) - Signed by Iruku, Praveena, MD on 07/17/2022    Prior Therapy: none  Current Therapy:  Anastrozole  and ribociclib     HISTORY OF PRESENT ILLNESS:   Oncology History  Malignant neoplasm of upper-outer quadrant of right breast in female, estrogen receptor positive (HCC)  06/19/2022 Mammogram   Mammogram showed large mass in the right breast which appears to measure 8 to 9 cm on ultrasound, 2 abnormal right axillary lymph node and a third borderline lymph node.  Skin thickening noted along the medial aspect of left breast which may be an extension of the marked skin thickening diffusely on the right.  Otherwise no left breast abnormalities are identified.   06/22/2022 Pathology Results   Needle core biopsy  from the right breast upper outer quadrant showed grade 2 IDC, both lymph nodes with metastatic adenocarcinoma.  Prognostic showed ER 90% positive strong staining PR 70% positive strong staining Ki-67 of 20% HER2 negative by IHC 1+   07/03/2022 Initial Diagnosis   Malignant neoplasm of upper-outer quadrant of right breast in female, estrogen receptor positive (HCC)   07/04/2022 Cancer Staging   Staging form: Breast, AJCC 8th Edition - Clinical: Stage IV (cT4, cN2, cM1, G2, ER+, PR+, HER2-) - Signed by Loretha Ash, MD on 07/17/2022 Stage prefix: Initial diagnosis Histologic grading system: 3 grade system   07/16/2022 - 07/16/2022 Chemotherapy   Patient is on Treatment Plan : BREAST ADJUVANT DOSE DENSE AC q14d / PACLitaxel  q7d      Genetic Testing   [facility] Custom Panel+RNA was Negative. Report date is 07/10/2022.  The Custom Hereditary Cancers Panel offered by [facility] includes sequencing and/or deletion duplication testing of the following 43 genes: APC, ATM, AXIN2, BAP1, BARD1, BMPR1A, BRCA1, BRCA2, BRIP1, CDH1, CDK4, CDKN2A (p14ARF and p16INK4a only), CHEK2, CTNNA1, EPCAM (Deletion/duplication testing only), FH, GREM1 (promoter region duplication testing only), HOXB13, KIT, MBD4, MEN1, MLH1, MSH2, MSH3, MSH6, MUTYH, NF1, NHTL1, PALB2, PDGFRA, PMS2, POLD1, POLE, PTEN, RAD51C, RAD51D, SMAD4, SMARCA4. STK11, TP53, TSC1, TSC2, and VHL.   12/13/2022 - 12/13/2022 Chemotherapy   Patient is on Treatment Plan : BREAST METASTATIC Fam-Trastuzumab Deruxtecan-nxki (Enhertu) (5.4) q21d  12/21/2022 -  Chemotherapy   Patient is on Treatment Plan : BREAST Paclitaxel -Albumin (Abraxane ) (100) D1,8 + Carboplatin  AUC2 D1,8 every 3 weeks, q21d        INTERVAL HISTORY:   Wayne is a 58 y.o. female presenting to clinic today for follow up of metastatic inflammatory right breast cancer. She was last seen by me on 02/21/23.  Since her last visit, she underwent restaging PET on 03/04/23 that found: Mixed response of  tumor in the right breast, right axilla, and liver. Mixed response of skeletal metastases. Index prior right sternal manubrial tumor has resolved. Increased prominence of an index metastatic lesion inferiorly in the left scapula. New hypermetabolic focus along the gallbladder fossa, and new mildly hypermetabolic porta hepatis adenopathy. Small amount of accentuated metabolic activity along the vaginal vestibule, query urinary incontinence as a potential cause. Aortic atherosclerosis.  Today, she states that she is doing well overall. Her appetite level is at 25%. Her energy level is at 25%. She is accompanied by her husband.  For the past week Gladie has experienced body aches, headaches, nausea, diarrhea, decreased appetite, congestion, and clogged ears. She states congestion and clogged ears are slowly improving with Augmentin . Her nausea has resolved, which was worsened from sinus drainage in the back of the throat. She had one episode of diarrhea last night that has otherwise improved. Karleen attributes diarrhea to eating her first full meal last night in the past week. Green secretions have improved.  She states side effects lasting 1-2 days of tiredness and mild nausea due to treatment. She is otherwise tolerating chemotherapy well. She denies any autoimmune disorders.  Tabytha notes stable numbness and tingling from lymphedema in the hands and feet.    PAST MEDICAL HISTORY:   Past Medical History: Past Medical History:  Diagnosis Date   Depression    Diabetes mellitus without complication (HCC)    Erosive esophagitis 04/26/2010   EGD by Dr. Shaaron, small hiatal hernia   GERD (gastroesophageal reflux disease)    History of stomach ulcers    IBS (irritable bowel syndrome)    Diarrhea predominant   Microscopic colitis 04/26/2010   Colonoscopy by Dr. Shaaron, good response with Entocort   Tubular adenoma of colon 04/26/2010   Junction of descending and sigmoid 40 CM from anus    Surgical  History: Past Surgical History:  Procedure Laterality Date   ABDOMINAL HYSTERECTOMY     APPENDECTOMY  2/11   Dr. Floria with a delayed closure   BREAST BIOPSY Right 06/22/2022   US  RT BREAST BX W LOC DEV 1ST LESION IMG BX SPEC US  GUIDE 06/22/2022 [facility] MAMMOGRAPHY   PORTACATH PLACEMENT N/A 07/16/2022   Procedure: INSERTION PORT-A-CATH;  Surgeon: [name], MD;  Location: [facility];  Service: General;  Laterality: N/A;   TONSILLECTOMY     TUBAL LIGATION      Social History: Social History   Socioeconomic History   Marital status: Married    Spouse name: Not on file   Number of children: Not on file   Years of education: Not on file   Highest education level: Not on file  Occupational History    Employer: [employer]    Comment: Third shift  Tobacco Use   Smoking status: Every Day    Current packs/day: 0.50    Types: Cigarettes   Smokeless tobacco: Never  Vaping Use   Vaping status: Never Used  Substance and Sexual Activity   Alcohol use: Yes  Alcohol/week: 1.0 standard drink of alcohol    Types: 1 Glasses of wine per week    Comment: seldom   Drug use: No   Sexual activity: Yes    Birth control/protection: Surgical  Other Topics Concern   Not on file  Social History Narrative   Not on file   Social Drivers of Health   Financial Resource Strain: Not on file  Food Insecurity: No Food Insecurity (07/04/2022)   Hunger Vital Sign    Worried About Running Out of Food in the Last Year: Never true    Ran Out of Food in the Last Year: Never true  Transportation Needs: No Transportation Needs (07/04/2022)   PRAPARE - Administrator, Civil Service (Medical): No    Lack of Transportation (Non-Medical): No  Physical Activity: Not on file  Stress: Not on file  Social Connections: Not on file  Intimate Partner Violence: Not on file    Family History: Family History  Problem Relation Age of Onset   Diabetes Mother    Coronary artery  disease Mother    Healthy Father     Current Medications:  Current Outpatient Medications:    amoxicillin -clavulanate (AUGMENTIN ) 875-125 MG tablet, Take 1 tablet by mouth 2 (two) times daily., Disp: 14 tablet, Rfl: 0   Aspirin-Acetaminophen -Caffeine (GOODY HEADACHE PO), Take 1 packet by mouth 2 (two) times daily as needed (for pain)., Disp: , Rfl:    Cholecalciferol 1.25 MG (50000 UT) capsule, Take 1 capsule by mouth once a week., Disp: , Rfl:    HYDROcodone -acetaminophen  (NORCO/VICODIN) 5-325 MG tablet, Take 1 tablet by mouth every 6 (six) hours as needed for moderate pain., Disp: 15 tablet, Rfl: 0   lidocaine -prilocaine  (EMLA ) cream, Apply to affected area once, Disp: 30 g, Rfl: 3   ondansetron  (ZOFRAN ) 8 MG tablet, TAKE 1 TABLET(8 MG) BY MOUTH EVERY 8 HOURS AS NEEDED FOR NAUSEA OR VOMITING. START THIRD DAY AFTER DOXORUBICIN/ CYCLOPHOSPHAMIDE CHEMOTHERAPY, Disp: 30 tablet, Rfl: 1   prochlorperazine  (COMPAZINE ) 10 MG tablet, Take 1 tablet (10 mg total) by mouth every 6 (six) hours as needed for nausea or vomiting., Disp: 30 tablet, Rfl: 1 No current facility-administered medications for this visit.  Facility-Administered Medications Ordered in Other Visits:    0.9 %  sodium chloride  infusion, , Intravenous, Continuous, [name], MD, Stopped at 03/14/23 1450   sodium chloride  flush (NS) 0.9 % injection 10 mL, 10 mL, Intracatheter, PRN, Myalynn Lingle, MD, 10 mL at 03/14/23 1449   Allergies: No Known Allergies  REVIEW OF SYSTEMS:   Review of Systems  Constitutional:  Negative for chills, fatigue and fever.  HENT:   Negative for lump/mass, mouth sores, nosebleeds, sore throat and trouble swallowing.        +sinus drainage  Eyes:  Negative for eye problems.  Respiratory:  Negative for cough and shortness of breath.   Cardiovascular:  Negative for chest pain, leg swelling and palpitations.       +swelling in left arm  Gastrointestinal:  Positive for diarrhea and  nausea. Negative for abdominal pain, constipation and vomiting.  Genitourinary:  Negative for bladder incontinence, difficulty urinating, dysuria, frequency, hematuria and nocturia.   Musculoskeletal:  Negative for arthralgias, back pain, flank pain, myalgias and neck pain.  Skin:  Negative for itching and rash.  Neurological:  Positive for dizziness and headaches. Negative for numbness.  Hematological:  Does not bruise/bleed easily.  Psychiatric/Behavioral:  Negative for depression, sleep disturbance and suicidal ideas. The patient is not  nervous/anxious.   All other systems reviewed and are negative.    VITALS:   Pulse (!) 105, weight 160 lb 7.9 oz (72.8 kg).  Wt Readings from Last 3 Encounters:  03/14/23 160 lb 7.9 oz (72.8 kg)  02/28/23 165 lb 6.4 oz (75 kg)  02/21/23 164 lb 8 oz (74.6 kg)    Body mass index is 26.71 kg/m.  Performance status (ECOG): 1 - Symptomatic but completely ambulatory   PHYSICAL EXAM:   Physical Exam Vitals and nursing note reviewed. Exam conducted with a chaperone present.  Constitutional:      Appearance: Normal appearance.  Cardiovascular:     Rate and Rhythm: Normal rate and regular rhythm.     Pulses: Normal pulses.     Heart sounds: Normal heart sounds.  Pulmonary:     Effort: Pulmonary effort is normal.     Breath sounds: Normal breath sounds.  Abdominal:     Palpations: Abdomen is soft. There is no hepatomegaly, splenomegaly or mass.     Tenderness: There is no abdominal tenderness.  Musculoskeletal:     Right lower leg: No edema.     Left lower leg: No edema.  Lymphadenopathy:     Cervical: No cervical adenopathy.     Right cervical: No superficial, deep or posterior cervical adenopathy.    Left cervical: No superficial, deep or posterior cervical adenopathy.     Upper Body:     Right upper body: No supraclavicular or axillary adenopathy.     Left upper body: No supraclavicular or axillary adenopathy.  Neurological:     General:  No focal deficit present.     Mental Status: She is alert and oriented to person, place, and time.  Psychiatric:        Mood and Affect: Mood normal.        Behavior: Behavior normal.     LABS:      Latest Ref Rng & Units 03/14/2023    9:54 AM 02/28/2023   10:40 AM 02/21/2023   10:14 AM  CBC  WBC 4.0 - 10.5 K/uL 4.9  5.2  4.0   Hemoglobin 12.0 - 15.0 g/dL 9.9  88.7  87.5   Hematocrit 36.0 - 46.0 % 29.8  33.3  38.4   Platelets 150 - 400 K/uL 190  180  184       Latest Ref Rng & Units 03/14/2023    9:54 AM 02/28/2023   10:40 AM 02/21/2023   10:14 AM  CMP  Glucose 70 - 99 mg/dL 862  864  855   BUN 6 - 20 mg/dL 19  9  8    Creatinine 0.44 - 1.00 mg/dL 9.16  9.42  9.37   Sodium 135 - 145 mmol/L 133  137  133   Potassium 3.5 - 5.1 mmol/L 3.1  3.8  3.9   Chloride 98 - 111 mmol/L 100  108  104   CO2 22 - 32 mmol/L 20  22  20    Calcium 8.9 - 10.3 mg/dL 8.4  8.4  8.5   Total Protein 6.5 - 8.1 g/dL 6.5  6.4  6.9   Total Bilirubin 0.0 - 1.2 mg/dL 0.4  0.6  0.7   Alkaline Phos 38 - 126 U/L 103  93  110   AST 15 - 41 U/L 22  25  27    ALT 0 - 44 U/L 16  20  19       No results found for: CEA1, CEA / No results  found for: CEA1, CEA No results found for: PSA1 No results found for: CAN199 No results found for: CAN125  No results found for: TOTALPROTELP, ALBUMINELP, A1GS, A2GS, BETS, BETA2SER, GAMS, MSPIKE, SPEI No results found for: TIBC, FERRITIN, IRONPCTSAT No results found for: LDH   STUDIES:   NM PET Image Restag (PS) Skull Base To Thigh Result Date: 03/10/2023 CLINICAL DATA:  Subsequent treatment strategy for metastatic right breast cancer. EXAM: NUCLEAR MEDICINE PET SKULL BASE TO THIGH TECHNIQUE: 8.3 mCi F-18 FDG was injected intravenously. Full-ring PET imaging was performed from the skull base to thigh after the radiotracer. CT data was obtained and used for attenuation correction and anatomic localization. Fasting blood glucose: 116 mg/dl  COMPARISON:  89/89/7975 FINDINGS: Mediastinal blood pool activity: SUV max 2.3 Liver activity: SUV max NA NECK: No significant abnormal hypermetabolic activity in this region. Incidental CT findings: None. CHEST: Mixed response of tumor in the right breast. Foci of activity along glandular and superficial tissues just above the areola, dominant focus with maximum SUV 7.9, formerly 6.9. Small hypermetabolic cutaneous focus noted laterally along the breast with maximum SUV 6.2, previously 5.2. Other previous cutaneous foci of hypermetabolic activity medially are resolved. Overall this is a mixed appearance of the right breast. A spiculated right axillary density with solid portion measuring about 1.7 by 1.1 cm on image 69 series 4 has maximum SUV of 2.0, previously 5.0. Diffuse cutaneous thickening along both breasts with low-grade activity noted. Previous hypermetabolic left axillary adenopathy has resolved. Incidental CT findings: Right Port-A-Cath tip: Cavoatrial junction. ABDOMEN/PELVIS: Mixed response of tumor in the liver. Increased metabolic activity of the tumor component in the lateral segment left hepatic lobe, maximum SUV 7.6, formerly 6.8. The tumor component segment 4 has a maximum SUV of 6.1, formerly 8.7. New hypermetabolic focus along the gallbladder fossa with maximum SUV 4.4, measuring approximately 2.2 by 1.5 cm. New mildly hypermetabolic porta hepatis adenopathy, including a 0.9 cm portacaval node with maximum SUV 4.4, previously 1.8. Small amount of accentuated metabolic activity along the vaginal vestibule, maximum SUV 5.0, query urinary incontinence as a potential cause. Incidental CT findings: Atherosclerosis is present, including aortoiliac atherosclerotic disease. SKELETON: Scattered hypermetabolic metastatic lesions in the skeleton, with mixed response from prior. Index prior right sternal manubrial tumor with maximum SUV 8.2 has resolved, current maximum SUV 2.2. Increased prominence of an  index metastatic lesion inferiorly in the left scapula, maximum SUV 5.3, previous regional activity this location 1.7. Scattered low-grade activity associated with small metastatic lesions. A lesion posteriorly in the right iliac bone near the sacroiliac joint has maximum SUV of 3.5, previously 6.7. T9 vertebral body lesion maximum SUV 4.5, previously 3.3. Widespread sclerotic lesions compatible with treated metastases are mostly not substantially hypermetabolic. Incidental CT findings: None. IMPRESSION: 1. Mixed response of tumor in the right breast, right axilla, and liver. 2. Mixed response of skeletal metastases. Index prior right sternal manubrial tumor has resolved. Increased prominence of an index metastatic lesion inferiorly in the left scapula. 3. New hypermetabolic focus along the gallbladder fossa, and new mildly hypermetabolic porta hepatis adenopathy. 4. Small amount of accentuated metabolic activity along the vaginal vestibule, query urinary incontinence as a potential cause. 5. Aortic atherosclerosis. Electronically Signed   By: Ryan Salvage M.D.   On: 03/10/2023 08:44

## 2023-03-14 ENCOUNTER — Inpatient Hospital Stay: Payer: 59 | Attending: Hematology and Oncology

## 2023-03-14 ENCOUNTER — Encounter: Payer: Self-pay | Admitting: Hematology

## 2023-03-14 ENCOUNTER — Inpatient Hospital Stay: Payer: 59

## 2023-03-14 ENCOUNTER — Inpatient Hospital Stay (HOSPITAL_BASED_OUTPATIENT_CLINIC_OR_DEPARTMENT_OTHER): Payer: 59 | Admitting: Hematology

## 2023-03-14 VITALS — BP 125/80 | HR 87 | Temp 98.0°F | Resp 18

## 2023-03-14 VITALS — HR 105 | Wt 160.5 lb

## 2023-03-14 DIAGNOSIS — C787 Secondary malignant neoplasm of liver and intrahepatic bile duct: Secondary | ICD-10-CM | POA: Diagnosis not present

## 2023-03-14 DIAGNOSIS — R599 Enlarged lymph nodes, unspecified: Secondary | ICD-10-CM | POA: Diagnosis not present

## 2023-03-14 DIAGNOSIS — Z1721 Progesterone receptor positive status: Secondary | ICD-10-CM | POA: Diagnosis not present

## 2023-03-14 DIAGNOSIS — Z79899 Other long term (current) drug therapy: Secondary | ICD-10-CM | POA: Insufficient documentation

## 2023-03-14 DIAGNOSIS — I890 Lymphedema, not elsewhere classified: Secondary | ICD-10-CM | POA: Diagnosis not present

## 2023-03-14 DIAGNOSIS — Z9049 Acquired absence of other specified parts of digestive tract: Secondary | ICD-10-CM | POA: Diagnosis not present

## 2023-03-14 DIAGNOSIS — K580 Irritable bowel syndrome with diarrhea: Secondary | ICD-10-CM | POA: Insufficient documentation

## 2023-03-14 DIAGNOSIS — Z170 Estrogen receptor positive status [ER+]: Secondary | ICD-10-CM | POA: Diagnosis not present

## 2023-03-14 DIAGNOSIS — I700 Atherosclerosis of aorta: Secondary | ICD-10-CM | POA: Insufficient documentation

## 2023-03-14 DIAGNOSIS — Z8249 Family history of ischemic heart disease and other diseases of the circulatory system: Secondary | ICD-10-CM | POA: Diagnosis not present

## 2023-03-14 DIAGNOSIS — F17210 Nicotine dependence, cigarettes, uncomplicated: Secondary | ICD-10-CM | POA: Insufficient documentation

## 2023-03-14 DIAGNOSIS — C7951 Secondary malignant neoplasm of bone: Secondary | ICD-10-CM | POA: Diagnosis not present

## 2023-03-14 DIAGNOSIS — M533 Sacrococcygeal disorders, not elsewhere classified: Secondary | ICD-10-CM | POA: Insufficient documentation

## 2023-03-14 DIAGNOSIS — Z833 Family history of diabetes mellitus: Secondary | ICD-10-CM | POA: Insufficient documentation

## 2023-03-14 DIAGNOSIS — Z5112 Encounter for antineoplastic immunotherapy: Secondary | ICD-10-CM | POA: Insufficient documentation

## 2023-03-14 DIAGNOSIS — Z79811 Long term (current) use of aromatase inhibitors: Secondary | ICD-10-CM | POA: Insufficient documentation

## 2023-03-14 DIAGNOSIS — Z5111 Encounter for antineoplastic chemotherapy: Secondary | ICD-10-CM | POA: Diagnosis present

## 2023-03-14 DIAGNOSIS — Z90710 Acquired absence of both cervix and uterus: Secondary | ICD-10-CM | POA: Diagnosis not present

## 2023-03-14 DIAGNOSIS — Z860101 Personal history of adenomatous and serrated colon polyps: Secondary | ICD-10-CM | POA: Insufficient documentation

## 2023-03-14 DIAGNOSIS — C50411 Malignant neoplasm of upper-outer quadrant of right female breast: Secondary | ICD-10-CM | POA: Insufficient documentation

## 2023-03-14 DIAGNOSIS — Z1722 Progesterone receptor negative status: Secondary | ICD-10-CM | POA: Diagnosis not present

## 2023-03-14 LAB — CBC WITH DIFFERENTIAL/PLATELET
Abs Immature Granulocytes: 0.1 10*3/uL — ABNORMAL HIGH (ref 0.00–0.07)
Band Neutrophils: 1 %
Basophils Absolute: 0 10*3/uL (ref 0.0–0.1)
Basophils Relative: 0 %
Eosinophils Absolute: 0 10*3/uL (ref 0.0–0.5)
Eosinophils Relative: 1 %
HCT: 29.8 % — ABNORMAL LOW (ref 36.0–46.0)
Hemoglobin: 9.9 g/dL — ABNORMAL LOW (ref 12.0–15.0)
Lymphocytes Relative: 30 %
Lymphs Abs: 1.5 10*3/uL (ref 0.7–4.0)
MCH: 30.7 pg (ref 26.0–34.0)
MCHC: 33.2 g/dL (ref 30.0–36.0)
MCV: 92.3 fL (ref 80.0–100.0)
Metamyelocytes Relative: 1 %
Monocytes Absolute: 0.2 10*3/uL (ref 0.1–1.0)
Monocytes Relative: 4 %
Myelocytes: 1 %
Neutro Abs: 3.1 10*3/uL (ref 1.7–7.7)
Neutrophils Relative %: 62 %
Platelets: 190 10*3/uL (ref 150–400)
RBC: 3.23 MIL/uL — ABNORMAL LOW (ref 3.87–5.11)
RDW: 18.1 % — ABNORMAL HIGH (ref 11.5–15.5)
Smear Review: ADEQUATE
WBC: 4.9 10*3/uL (ref 4.0–10.5)
nRBC: 0 % (ref 0.0–0.2)

## 2023-03-14 LAB — COMPREHENSIVE METABOLIC PANEL
ALT: 16 U/L (ref 0–44)
AST: 22 U/L (ref 15–41)
Albumin: 2.6 g/dL — ABNORMAL LOW (ref 3.5–5.0)
Alkaline Phosphatase: 103 U/L (ref 38–126)
Anion gap: 13 (ref 5–15)
BUN: 19 mg/dL (ref 6–20)
CO2: 20 mmol/L — ABNORMAL LOW (ref 22–32)
Calcium: 8.4 mg/dL — ABNORMAL LOW (ref 8.9–10.3)
Chloride: 100 mmol/L (ref 98–111)
Creatinine, Ser: 0.83 mg/dL (ref 0.44–1.00)
GFR, Estimated: >60 mL/min (ref >60)
Glucose, Bld: 137 mg/dL — ABNORMAL HIGH (ref 70–99)
Potassium: 3.1 mmol/L — ABNORMAL LOW (ref 3.5–5.1)
Sodium: 133 mmol/L — ABNORMAL LOW (ref 135–145)
Total Bilirubin: 0.4 mg/dL (ref 0.0–1.2)
Total Protein: 6.5 g/dL (ref 6.5–8.1)

## 2023-03-14 LAB — MAGNESIUM: Magnesium: 1.8 mg/dL (ref 1.7–2.4)

## 2023-03-14 MED ORDER — SODIUM CHLORIDE 0.9 % IV SOLN
200.0000 mg | Freq: Once | INTRAVENOUS | Status: AC
  Administered 2023-03-14: 200 mg via INTRAVENOUS
  Filled 2023-03-14: qty 8

## 2023-03-14 MED ORDER — HEPARIN SOD (PORK) LOCK FLUSH 100 UNIT/ML IV SOLN
500.0000 [IU] | Freq: Once | INTRAVENOUS | Status: AC | PRN
  Administered 2023-03-14: 500 [IU]

## 2023-03-14 MED ORDER — POTASSIUM CHLORIDE CRYS ER 20 MEQ PO TBCR
40.0000 meq | EXTENDED_RELEASE_TABLET | Freq: Once | ORAL | Status: AC
  Administered 2023-03-14: 40 meq via ORAL
  Filled 2023-03-14: qty 2

## 2023-03-14 MED ORDER — DEXAMETHASONE SODIUM PHOSPHATE 10 MG/ML IJ SOLN
10.0000 mg | Freq: Once | INTRAMUSCULAR | Status: AC
  Administered 2023-03-14: 10 mg via INTRAVENOUS
  Filled 2023-03-14: qty 1

## 2023-03-14 MED ORDER — PACLITAXEL PROTEIN-BOUND CHEMO INJECTION 100 MG
125.0000 mg/m2 | Freq: Once | INTRAVENOUS | Status: AC
  Administered 2023-03-14: 225 mg via INTRAVENOUS
  Filled 2023-03-14: qty 45

## 2023-03-14 MED ORDER — SODIUM CHLORIDE 0.9% FLUSH
10.0000 mL | INTRAVENOUS | Status: DC | PRN
  Administered 2023-03-14: 10 mL

## 2023-03-14 MED ORDER — PALONOSETRON HCL INJECTION 0.25 MG/5ML
0.2500 mg | Freq: Once | INTRAVENOUS | Status: AC
  Administered 2023-03-14: 0.25 mg via INTRAVENOUS
  Filled 2023-03-14: qty 5

## 2023-03-14 MED ORDER — SODIUM CHLORIDE 0.9 % IV SOLN: INTRAVENOUS | Status: DC

## 2023-03-14 MED ORDER — CARBOPLATIN CHEMO INJECTION 450 MG/45ML
228.2000 mg | Freq: Once | INTRAVENOUS | Status: AC
  Administered 2023-03-14: 230 mg via INTRAVENOUS
  Filled 2023-03-14: qty 23

## 2023-03-14 NOTE — Progress Notes (Signed)
 Patient presents today for follow up visit with Dr. Rogers and Keytruda , Abraxane . Heart rate elevated on arrival. 109. Labs within parameters for treatment. Potassium 3.1. Standing orders followed.   Message received from A.Lenon RN / Dr. Katragadda to proceed with treatment. Keytruda  and carboplatin  added back today per MD orders. Vital signs and labs reviewed by MD.   Treatment given today per MD orders. Tolerated infusion without adverse affects. Vital signs stable. No complaints at this time. Discharged from clinic ambulatory in stable condition. Alert and oriented x 3. F/U with [facility] as scheduled.

## 2023-03-14 NOTE — Patient Instructions (Signed)
 [facility] - A DEPT OF [facility]  Discharge Instructions: Thank you for choosing [facility] to provide your oncology and hematology care.  If you have a lab appointment with the Cancer Center - please note that after April 8th, 2024, all labs will be drawn in the cancer center.  You do not have to check in or register with the main entrance as you have in the past but will complete your check-in in the cancer center.  Wear comfortable clothing and clothing appropriate for easy access to any Portacath or PICC line.   We strive to give you quality time with your provider. You may need to reschedule your appointment if you arrive late (15 or more minutes).  Arriving late affects you and other patients whose appointments are after yours.  Also, if you miss three or more appointments without notifying the office, you may be dismissed from the clinic at the provider's discretion.      For prescription refill requests, have your pharmacy contact our office and allow 72 hours for refills to be completed.    Today you received the following chemotherapy and/or immunotherapy agents Keytruda , Abraxane , Carboplatin . Carboplatin  Injection What is this medication? CARBOPLATIN  (KAR boe pla tin) treats some types of cancer. It works by slowing down the growth of cancer cells. This medicine may be used for other purposes; ask your health care provider or pharmacist if you have questions. COMMON BRAND NAME(S): Paraplatin  What should I tell my care team before I take this medication? They need to know if you have any of these conditions: Blood disorders Hearing problems Kidney disease Recent or ongoing radiation therapy An unusual or allergic reaction to carboplatin , cisplatin, other medications, foods, dyes, or preservatives Pregnant or trying to get pregnant Breast-feeding How should I use this medication? This medication is injected into a vein. It is  given by your care team in a hospital or clinic setting. Talk to your care team about the use of this medication in children. Special care may be needed. Overdosage: If you think you have taken too much of this medicine contact a poison control center or emergency room at once. NOTE: This medicine is only for you. Do not share this medicine with others. What if I miss a dose? Keep appointments for follow-up doses. It is important not to miss your dose. Call your care team if you are unable to keep an appointment. What may interact with this medication? Medications for seizures Some antibiotics, such as amikacin, gentamicin, neomycin, streptomycin, tobramycin Vaccines This list may not describe all possible interactions. Give your health care provider a list of all the medicines, herbs, non-prescription drugs, or dietary supplements you use. Also tell them if you smoke, drink alcohol, or use illegal drugs. Some items may interact with your medicine. What should I watch for while using this medication? Your condition will be monitored carefully while you are receiving this medication. You may need blood work while taking this medication. This medication may make you feel generally unwell. This is not uncommon, as chemotherapy can affect healthy cells as well as cancer cells. Report any side effects. Continue your course of treatment even though you feel ill unless your care team tells you to stop. In some cases, you may be given additional medications to help with side effects. Follow all directions for their use. This medication may increase your risk of getting an infection. Call your care team for advice  if you get a fever, chills, sore throat, or other symptoms of a cold or flu. Do not treat yourself. Try to avoid being around people who are sick. Avoid taking medications that contain aspirin, acetaminophen , ibuprofen , naproxen , or ketoprofen unless instructed by your care team. These medications  may hide a fever. Be careful brushing or flossing your teeth or using a toothpick because you may get an infection or bleed more easily. If you have any dental work done, tell your dentist you are receiving this medication. Talk to your care team if you wish to become pregnant or think you might be pregnant. This medication can cause serious birth defects. Talk to your care team about effective forms of contraception. Do not breast-feed while taking this medication. What side effects may I notice from receiving this medication? Side effects that you should report to your care team as soon as possible: Allergic reactions--skin rash, itching, hives, swelling of the face, lips, tongue, or throat Infection--fever, chills, cough, sore throat, wounds that don't heal, pain or trouble when passing urine, general feeling of discomfort or being unwell Low red blood cell level--unusual weakness or fatigue, dizziness, headache, trouble breathing Pain, tingling, or numbness in the hands or feet, muscle weakness, change in vision, confusion or trouble speaking, loss of balance or coordination, trouble walking, seizures Unusual bruising or bleeding Side effects that usually do not require medical attention (report to your care team if they continue or are bothersome): Hair loss Nausea Unusual weakness or fatigue Vomiting This list may not describe all possible side effects. Call your doctor for medical advice about side effects. You may report side effects to FDA at 1-800-FDA-1088. Where should I keep my medication? This medication is given in a hospital or clinic. It will not be stored at home. NOTE: This sheet is a summary. It may not cover all possible information. If you have questions about this medicine, talk to your doctor, pharmacist, or health care provider.  2024 Elsevier/Gold Standard (2021-05-16 00:00:00)Paclitaxel  Nanoparticle Albumin-Bound Injection What is this medication? NANOPARTICLE  ALBUMIN-BOUND PACLITAXEL  (Na no PAHR ti kuhl al BYOO muhn-bound PAK li TAX el) treats some types of cancer. It works by slowing down the growth of cancer cells. This medicine may be used for other purposes; ask your health care provider or pharmacist if you have questions. COMMON BRAND NAME(S): Abraxane  What should I tell my care team before I take this medication? They need to know if you have any of these conditions: Liver disease Low white blood cell levels An unusual or allergic reaction to paclitaxel , albumin, other medications, foods, dyes, or preservatives If you or your partner are pregnant or trying to get pregnant Breast-feeding How should I use this medication? This medication is injected into a vein. It is given by your care team in a hospital or clinic setting. Talk to your care team about the use of this medication in children. Special care may be needed. Overdosage: If you think you have taken too much of this medicine contact a poison control center or emergency room at once. NOTE: This medicine is only for you. Do not share this medicine with others. What if I miss a dose? Keep appointments for follow-up doses. It is important not to miss your dose. Call your care team if you are unable to keep an appointment. What may interact with this medication? Other medications may affect the way this medication works. Talk with your care team about all of the medications you take. They  may suggest changes to your treatment plan to lower the risk of side effects and to make sure your medications work as intended. This list may not describe all possible interactions. Give your health care provider a list of all the medicines, herbs, non-prescription drugs, or dietary supplements you use. Also tell them if you smoke, drink alcohol, or use illegal drugs. Some items may interact with your medicine. What should I watch for while using this medication? Your condition will be monitored carefully  while you are receiving this medication. You may need blood work while taking this medication. This medication may make you feel generally unwell. This is not uncommon as chemotherapy can affect healthy cells as well as cancer cells. Report any side effects. Continue your course of treatment even though you feel ill unless your care team tells you to stop. This medication can cause serious allergic reactions. To reduce the risk, your care team may give you other medications to take before receiving this one. Be sure to follow the directions from your care team. This medication may increase your risk of getting an infection. Call your care team for advice if you get a fever, chills, sore throat, or other symptoms of a cold or flu. Do not treat yourself. Try to avoid being around people who are sick. This medication may increase your risk to bruise or bleed. Call your care team if you notice any unusual bleeding. Be careful brushing or flossing your teeth or using a toothpick because you may get an infection or bleed more easily. If you have any dental work done, tell your dentist you are receiving this medication. Talk to your care team if you or your partner may be pregnant. Serious birth defects can occur if you take this medication during pregnancy and for 6 months after the last dose. You will need a negative pregnancy test before starting this medication. Contraception is recommended while taking this medication and for 6 months after the last dose. Your care team can help you find the option that works for you. If your partner can get pregnant, use a condom during sex while taking this medication and for 3 months after the last dose. Do not breastfeed while taking this medication and for 2 weeks after the last dose. This medication may cause infertility. Talk to your care team if you are concerned about your fertility. What side effects may I notice from receiving this medication? Side effects that  you should report to your care team as soon as possible: Allergic reactions--skin rash, itching, hives, swelling of the face, lips, tongue, or throat Dry cough, shortness of breath or trouble breathing Infection--fever, chills, cough, sore throat, wounds that don't heal, pain or trouble when passing urine, general feeling of discomfort or being unwell Low red blood cell level--unusual weakness or fatigue, dizziness, headache, trouble breathing Pain, tingling, or numbness in the hands or feet Stomach pain, unusual weakness or fatigue, nausea, vomiting, diarrhea, or fever that lasts longer than expected Unusual bruising or bleeding Side effects that usually do not require medical attention (report to your care team if they continue or are bothersome): Diarrhea Fatigue Hair loss Loss of appetite Nausea Vomiting This list may not describe all possible side effects. Call your doctor for medical advice about side effects. You may report side effects to FDA at 1-800-FDA-1088. Where should I keep my medication? This medication is given in a hospital or clinic. It will not be stored at home. NOTE: This sheet is a  summary. It may not cover all possible information. If you have questions about this medicine, talk to your doctor, pharmacist, or health care provider.  2024 Elsevier/Gold Standard (2021-06-08 00:00:00)Pembrolizumab  Injection What is this medication? PEMBROLIZUMAB  (PEM broe LIZ ue mab) treats some types of cancer. It works by helping your immune system slow or stop the spread of cancer cells. It is a monoclonal antibody. This medicine may be used for other purposes; ask your health care provider or pharmacist if you have questions. COMMON BRAND NAME(S): Keytruda  What should I tell my care team before I take this medication? They need to know if you have any of these conditions: Allogeneic stem cell transplant (uses someone else's stem cells) Autoimmune diseases, such as Crohn disease,  ulcerative colitis, lupus History of chest radiation Nervous system problems, such as Guillain-Barre syndrome, myasthenia gravis Organ transplant An unusual or allergic reaction to pembrolizumab , other medications, foods, dyes, or preservatives Pregnant or trying to get pregnant Breast-feeding How should I use this medication? This medication is injected into a vein. It is given by your care team in a hospital or clinic setting. A special MedGuide will be given to you before each treatment. Be sure to read this information carefully each time. Talk to your care team about the use of this medication in children. While it may be prescribed for children as young as 6 months for selected conditions, precautions do apply. Overdosage: If you think you have taken too much of this medicine contact a poison control center or emergency room at once. NOTE: This medicine is only for you. Do not share this medicine with others. What if I miss a dose? Keep appointments for follow-up doses. It is important not to miss your dose. Call your care team if you are unable to keep an appointment. What may interact with this medication? Interactions have not been studied. This list may not describe all possible interactions. Give your health care provider a list of all the medicines, herbs, non-prescription drugs, or dietary supplements you use. Also tell them if you smoke, drink alcohol, or use illegal drugs. Some items may interact with your medicine. What should I watch for while using this medication? Your condition will be monitored carefully while you are receiving this medication. You may need blood work while taking this medication. This medication may cause serious skin reactions. They can happen weeks to months after starting the medication. Contact your care team right away if you notice fevers or flu-like symptoms with a rash. The rash may be red or purple and then turn into blisters or peeling of the skin.  You may also notice a red rash with swelling of the face, lips, or lymph nodes in your neck or under your arms. Tell your care team right away if you have any change in your eyesight. Talk to your care team if you may be pregnant. Serious birth defects can occur if you take this medication during pregnancy and for 4 months after the last dose. You will need a negative pregnancy test before starting this medication. Contraception is recommended while taking this medication and for 4 months after the last dose. Your care team can help you find the option that works for you. Do not breastfeed while taking this medication and for 4 months after the last dose. What side effects may I notice from receiving this medication? Side effects that you should report to your care team as soon as possible: Allergic reactions--skin rash, itching, hives, swelling of  the face, lips, tongue, or throat Dry cough, shortness of breath or trouble breathing Eye pain, redness, irritation, or discharge with blurry or decreased vision Heart muscle inflammation--unusual weakness or fatigue, shortness of breath, chest pain, fast or irregular heartbeat, dizziness, swelling of the ankles, feet, or hands Hormone gland problems--headache, sensitivity to light, unusual weakness or fatigue, dizziness, fast or irregular heartbeat, increased sensitivity to cold or heat, excessive sweating, constipation, hair loss, increased thirst or amount of urine, tremors or shaking, irritability Infusion reactions--chest pain, shortness of breath or trouble breathing, feeling faint or lightheaded Kidney injury (glomerulonephritis)--decrease in the amount of urine, red or dark brown urine, foamy or bubbly urine, swelling of the ankles, hands, or feet Liver injury--right upper belly pain, loss of appetite, nausea, light-colored stool, dark yellow or brown urine, yellowing skin or eyes, unusual weakness or fatigue Pain, tingling, or numbness in the hands  or feet, muscle weakness, change in vision, confusion or trouble speaking, loss of balance or coordination, trouble walking, seizures Rash, fever, and swollen lymph nodes Redness, blistering, peeling, or loosening of the skin, including inside the mouth Sudden or severe stomach pain, bloody diarrhea, fever, nausea, vomiting Side effects that usually do not require medical attention (report to your care team if they continue or are bothersome): Bone, joint, or muscle pain Diarrhea Fatigue Loss of appetite Nausea Skin rash This list may not describe all possible side effects. Call your doctor for medical advice about side effects. You may report side effects to FDA at 1-800-FDA-1088. Where should I keep my medication? This medication is given in a hospital or clinic. It will not be stored at home. NOTE: This sheet is a summary. It may not cover all possible information. If you have questions about this medicine, talk to your doctor, pharmacist, or health care provider.  2024 Elsevier/Gold Standard (2021-06-06 00:00:00)      To help prevent nausea and vomiting after your treatment, we encourage you to take your nausea medication as directed.  BELOW ARE SYMPTOMS THAT SHOULD BE REPORTED IMMEDIATELY: *FEVER GREATER THAN 100.4 F (38 C) OR HIGHER *CHILLS OR SWEATING *NAUSEA AND VOMITING THAT IS NOT CONTROLLED WITH YOUR NAUSEA MEDICATION *UNUSUAL SHORTNESS OF BREATH *UNUSUAL BRUISING OR BLEEDING *URINARY PROBLEMS (pain or burning when urinating, or frequent urination) *BOWEL PROBLEMS (unusual diarrhea, constipation, pain near the anus) TENDERNESS IN MOUTH AND THROAT WITH OR WITHOUT PRESENCE OF ULCERS (sore throat, sores in mouth, or a toothache) UNUSUAL RASH, SWELLING OR PAIN  UNUSUAL VAGINAL DISCHARGE OR ITCHING   Items with * indicate a potential emergency and should be followed up as soon as possible or go to the Emergency Department if any problems should occur.  Please show the  CHEMOTHERAPY ALERT CARD or IMMUNOTHERAPY ALERT CARD at check-in to the Emergency Department and triage nurse.  Should you have questions after your visit or need to cancel or reschedule your appointment, please contact [facility] [place] - A DEPT OF [facility] [phone]  and follow the prompts.  Office hours are 8:00 a.m. to 4:30 p.m. Monday - Friday. Please note that voicemails left after 4:00 p.m. may not be returned until the following business day.  We are closed weekends and major holidays. You have access to a nurse at all times for urgent questions. Please call the main number to the clinic [phone] and follow the prompts.  For any non-urgent questions, you may also contact your provider using MyChart. We now offer e-Visits for anyone 22  and older to request care online for non-urgent symptoms. For details visit [email or web address].   Also download the MyChart app! Go to the app store, search MyChart, open the app, select [facility], and log in with your MyChart username and password.

## 2023-03-14 NOTE — Progress Notes (Signed)
 Patient has been examined by Dr. Ellin Saba. Vital signs and labs have been reviewed by MD - ANC, Creatinine, LFTs, hemoglobin, and platelets are within treatment parameters per M.D. - pt may proceed with treatment.  Primary RN and pharmacy notified.

## 2023-03-14 NOTE — Progress Notes (Signed)
 Received request to add carboplatin AUC 2 back into her plan on days 1 and 8.  Orders added with appropriate premed.  V.O. Dr Carilyn Goodpasture, PharmD

## 2023-03-14 NOTE — Patient Instructions (Addendum)
 [facility] at [facility] Discharge Instructions   You were seen and examined today by Dr. Rogers.  He reviewed the results of your lab work which are normal/stable.   He reviewed the results of your PET scan. It is showing a couple of new areas of concern.  Dr. MARLA would like to add Keytruda  (immunotherapy) to the treatment regimen. This will be given on day 1 of each cycle (every 3 weeks).   We will proceed with your treatment today.   Return as scheduled.    Thank you for choosing [facility] at [facility] to provide your oncology and hematology care.  To afford each patient quality time with our provider, please arrive at least 15 minutes before your scheduled appointment time.   If you have a lab appointment with the Cancer Center please come in thru the Main Entrance and check in at the main information desk.  You need to re-schedule your appointment should you arrive 10 or more minutes late.  We strive to give you quality time with our providers, and arriving late affects you and other patients whose appointments are after yours.  Also, if you no show three or more times for appointments you may be dismissed from the clinic at the providers discretion.     Again, thank you for choosing [facility].  Our hope is that these requests will decrease the amount of time that you wait before being seen by our physicians.       _____________________________________________________________  Should you have questions after your visit to [facility], please contact our office at [phone] and follow the prompts.  Our office hours are 8:00 a.m. and 4:30 p.m. Monday - Friday.  Please note that voicemails left after 4:00 p.m. may not be returned until the following business day.  We are closed weekends and major holidays.  You do have access to a nurse 24-7, just call the main number to the clinic [phone] and do not  press any options, hold on the line and a nurse will answer the phone.    For prescription refill requests, have your pharmacy contact our office and allow 72 hours.    Due to Covid, you will need to wear a mask upon entering the hospital. If you do not have a mask, a mask will be given to you at the Main Entrance upon arrival. For doctor visits, patients may have 1 support person age 84 or older with them. For treatment visits, patients can not have anyone with them due to social distancing guidelines and our immunocompromised population.

## 2023-03-14 NOTE — Progress Notes (Signed)
 Treatment given today per MD orders. Tolerated infusion without adverse affects. Vital signs stable. No complaints at this time. Discharged from clinic ambulatory in stable condition. Alert and oriented x 3. F/U with [facility] as scheduled.

## 2023-03-15 ENCOUNTER — Other Ambulatory Visit: Payer: Self-pay

## 2023-03-16 ENCOUNTER — Other Ambulatory Visit: Payer: Self-pay

## 2023-03-21 ENCOUNTER — Inpatient Hospital Stay: Payer: 59

## 2023-03-21 VITALS — BP 137/78 | HR 83 | Temp 98.0°F | Resp 18

## 2023-03-21 VITALS — BP 128/81 | HR 99 | Temp 98.3°F | Resp 18 | Wt 160.9 lb

## 2023-03-21 DIAGNOSIS — Z5112 Encounter for antineoplastic immunotherapy: Secondary | ICD-10-CM | POA: Diagnosis not present

## 2023-03-21 DIAGNOSIS — Z170 Estrogen receptor positive status [ER+]: Secondary | ICD-10-CM

## 2023-03-21 DIAGNOSIS — Z95828 Presence of other vascular implants and grafts: Secondary | ICD-10-CM

## 2023-03-21 LAB — CBC WITH DIFFERENTIAL/PLATELET
Abs Immature Granulocytes: 0.04 10*3/uL (ref 0.00–0.07)
Basophils Absolute: 0.1 10*3/uL (ref 0.0–0.1)
Basophils Relative: 2 %
Eosinophils Absolute: 0 10*3/uL (ref 0.0–0.5)
Eosinophils Relative: 1 %
HCT: 27.6 % — ABNORMAL LOW (ref 36.0–46.0)
Hemoglobin: 9.2 g/dL — ABNORMAL LOW (ref 12.0–15.0)
Immature Granulocytes: 1 %
Lymphocytes Relative: 28 %
Lymphs Abs: 1.1 10*3/uL (ref 0.7–4.0)
MCH: 32.1 pg (ref 26.0–34.0)
MCHC: 33.3 g/dL (ref 30.0–36.0)
MCV: 96.2 fL (ref 80.0–100.0)
Monocytes Absolute: 0.2 10*3/uL (ref 0.1–1.0)
Monocytes Relative: 5 %
Neutro Abs: 2.4 10*3/uL (ref 1.7–7.7)
Neutrophils Relative %: 63 %
Platelets: 158 10*3/uL (ref 150–400)
RBC: 2.87 MIL/uL — ABNORMAL LOW (ref 3.87–5.11)
RDW: 18.6 % — ABNORMAL HIGH (ref 11.5–15.5)
WBC: 3.8 10*3/uL — ABNORMAL LOW (ref 4.0–10.5)
nRBC: 1.3 % — ABNORMAL HIGH (ref 0.0–0.2)

## 2023-03-21 LAB — COMPREHENSIVE METABOLIC PANEL
ALT: 21 U/L (ref 0–44)
AST: 24 U/L (ref 15–41)
Albumin: 3.1 g/dL — ABNORMAL LOW (ref 3.5–5.0)
Alkaline Phosphatase: 86 U/L (ref 38–126)
Anion gap: 9 (ref 5–15)
BUN: 8 mg/dL (ref 6–20)
CO2: 22 mmol/L (ref 22–32)
Calcium: 7.9 mg/dL — ABNORMAL LOW (ref 8.9–10.3)
Chloride: 105 mmol/L (ref 98–111)
Creatinine, Ser: 0.61 mg/dL (ref 0.44–1.00)
GFR, Estimated: >60 mL/min (ref >60)
Glucose, Bld: 119 mg/dL — ABNORMAL HIGH (ref 70–99)
Potassium: 3.3 mmol/L — ABNORMAL LOW (ref 3.5–5.1)
Sodium: 136 mmol/L (ref 135–145)
Total Bilirubin: 0.4 mg/dL (ref 0.0–1.2)
Total Protein: 6.3 g/dL — ABNORMAL LOW (ref 6.5–8.1)

## 2023-03-21 LAB — MAGNESIUM: Magnesium: 1.4 mg/dL — ABNORMAL LOW (ref 1.7–2.4)

## 2023-03-21 MED ORDER — PALONOSETRON HCL INJECTION 0.25 MG/5ML
0.2500 mg | Freq: Once | INTRAVENOUS | Status: AC
  Administered 2023-03-21: 0.25 mg via INTRAVENOUS
  Filled 2023-03-21: qty 5

## 2023-03-21 MED ORDER — SODIUM CHLORIDE 0.9 % IV SOLN: INTRAVENOUS | Status: DC

## 2023-03-21 MED ORDER — DEXAMETHASONE SODIUM PHOSPHATE 10 MG/ML IJ SOLN
10.0000 mg | Freq: Once | INTRAMUSCULAR | Status: AC
Start: 2023-03-21 — End: 2023-03-21
  Administered 2023-03-21: 10 mg via INTRAVENOUS
  Filled 2023-03-21: qty 1

## 2023-03-21 MED ORDER — POTASSIUM CHLORIDE CRYS ER 20 MEQ PO TBCR
40.0000 meq | EXTENDED_RELEASE_TABLET | Freq: Once | ORAL | Status: AC
  Administered 2023-03-21: 40 meq via ORAL
  Filled 2023-03-21: qty 2

## 2023-03-21 MED ORDER — MAGNESIUM SULFATE 4 GM/100ML IV SOLN
4.0000 g | Freq: Once | INTRAVENOUS | Status: AC
  Administered 2023-03-21: 4 g via INTRAVENOUS
  Filled 2023-03-21: qty 100

## 2023-03-21 MED ORDER — SODIUM CHLORIDE 0.9% FLUSH
10.0000 mL | INTRAVENOUS | Status: DC | PRN
Start: 2023-03-21 — End: 2023-03-21
  Administered 2023-03-21: 10 mL

## 2023-03-21 MED ORDER — PACLITAXEL PROTEIN-BOUND CHEMO INJECTION 100 MG
125.0000 mg/m2 | Freq: Once | INTRAVENOUS | Status: AC
  Administered 2023-03-21: 225 mg via INTRAVENOUS
  Filled 2023-03-21: qty 45

## 2023-03-21 MED ORDER — SODIUM CHLORIDE 0.9 % IV SOLN
235.0000 mg | Freq: Once | INTRAVENOUS | Status: AC
  Administered 2023-03-21: 240 mg via INTRAVENOUS
  Filled 2023-03-21: qty 24

## 2023-03-21 MED ORDER — HEPARIN SOD (PORK) LOCK FLUSH 100 UNIT/ML IV SOLN
500.0000 [IU] | Freq: Once | INTRAVENOUS | Status: AC | PRN
  Administered 2023-03-21: 500 [IU]

## 2023-03-21 MED ORDER — SODIUM CHLORIDE 0.9% FLUSH
10.0000 mL | INTRAVENOUS | Status: DC | PRN
Start: 2023-03-21 — End: 2023-04-26
  Administered 2023-03-21: 10 mL via INTRAVENOUS

## 2023-03-21 NOTE — Progress Notes (Signed)
Treatment given today per MD orders.  Stable during infusion without adverse affects.  Vital signs stable.  No complaints at this time.  Discharge from clinic ambulatory in stable condition.  Alert and oriented X 3.  Follow up with [facility] as scheduled.

## 2023-03-21 NOTE — Patient Instructions (Signed)
[facility] [place] - A DEPT OF [facility]  Discharge Instructions: Thank you for choosing [facility] to provide your oncology and hematology care.  If you have a lab appointment with the Cancer Center - please note that after April 8th, 2024, all labs will be drawn in the cancer center.  You do not have to check in or register with the main entrance as you have in the past but will complete your check-in in the cancer center.  Wear comfortable clothing and clothing appropriate for easy access to any Portacath or PICC line.   We strive to give you quality time with your provider. You may need to reschedule your appointment if you arrive late (15 or more minutes).  Arriving late affects you and other patients whose appointments are after yours.  Also, if you miss three or more appointments without notifying the office, you may be dismissed from the clinic at the provider's discretion.      For prescription refill requests, have your pharmacy contact our office and allow 72 hours for refills to be completed.    Today you received the following chemotherapy and/or immunotherapy agents abraxane carboplatin      To help prevent nausea and vomiting after your treatment, we encourage you to take your nausea medication as directed.  BELOW ARE SYMPTOMS THAT SHOULD BE REPORTED IMMEDIATELY: *FEVER GREATER THAN 100.4 F (38 C) OR HIGHER *CHILLS OR SWEATING *NAUSEA AND VOMITING THAT IS NOT CONTROLLED WITH YOUR NAUSEA MEDICATION *UNUSUAL SHORTNESS OF BREATH *UNUSUAL BRUISING OR BLEEDING *URINARY PROBLEMS (pain or burning when urinating, or frequent urination) *BOWEL PROBLEMS (unusual diarrhea, constipation, pain near the anus) TENDERNESS IN MOUTH AND THROAT WITH OR WITHOUT PRESENCE OF ULCERS (sore throat, sores in mouth, or a toothache) UNUSUAL RASH, SWELLING OR PAIN  UNUSUAL VAGINAL DISCHARGE OR ITCHING   Items with * indicate a potential emergency and should be  followed up as soon as possible or go to the Emergency Department if any problems should occur.  Please show the CHEMOTHERAPY ALERT CARD or IMMUNOTHERAPY ALERT CARD at check-in to the Emergency Department and triage nurse.  Should you have questions after your visit or need to cancel or reschedule your appointment, please contact [facility] [place] - A DEPT OF Eligha Bridegroom [facility] [phone]  and follow the prompts.  Office hours are 8:00 a.m. to 4:30 p.m. Monday - Friday. Please note that voicemails left after 4:00 p.m. may not be returned until the following business day.  We are closed weekends and major holidays. You have access to a nurse at all times for urgent questions. Please call the main number to the clinic [phone] and follow the prompts.  For any non-urgent questions, you may also contact your provider using MyChart. We now offer e-Visits for anyone 72 and older to request care online for non-urgent symptoms. For details visit [email or web address].   Also download the MyChart app! Go to the app store, search "MyChart", open the app, select [place], and log in with your MyChart username and password.

## 2023-03-21 NOTE — Progress Notes (Signed)
Patient presents today for Abraxane and Carboplatin infusion.  Patient is in satisfactory condition with no new complaints voiced.  Vital signs are stable.  Labs reviewed and all labs are within treatment parameters. Patient will receive magnesium sulfate 4g IV and 40 potassium chloride p.o 1 dose per Dr.Katragadda's standing orders.  We will proceed with treatment per MD orders.    Patient was currently prescribed antibiotics at her last visit for a sinus infection. Patient stated she has improved, but she stated that she feels like her ears are full of fluid now. Yesterday patient c/o severe dizziness and vomited for several hours. Patient was unsure if it was vertigo or stomach bug. Patient also mentioned that she had itching the day after the [place] last time. Itching resolved with Benadryl. Dr.Katragadda made aware and recommended patient to try Flonase, Sudafed, and Antihistamine to see if this combination would help.

## 2023-03-24 ENCOUNTER — Other Ambulatory Visit: Payer: Self-pay | Admitting: Hematology and Oncology

## 2023-03-24 DIAGNOSIS — Z170 Estrogen receptor positive status [ER+]: Secondary | ICD-10-CM

## 2023-03-25 ENCOUNTER — Encounter: Payer: Self-pay | Admitting: Hematology and Oncology

## 2023-03-25 ENCOUNTER — Ambulatory Visit
Admission: RE | Admit: 2023-03-25 | Discharge: 2023-03-25 | Disposition: A | Discharge status: Home / Self Care | Payer: 59 | Source: Ambulatory Visit | Attending: Nurse Practitioner | Admitting: Nurse Practitioner

## 2023-03-25 ENCOUNTER — Encounter (HOSPITAL_COMMUNITY): Payer: Self-pay | Admitting: Hematology

## 2023-03-25 VITALS — BP 117/67 | HR 100 | Temp 98.1°F | Resp 16

## 2023-03-25 DIAGNOSIS — J019 Acute sinusitis, unspecified: Secondary | ICD-10-CM

## 2023-03-25 DIAGNOSIS — B9689 Other specified bacterial agents as the cause of diseases classified elsewhere: Secondary | ICD-10-CM

## 2023-03-25 DIAGNOSIS — H65193 Other acute nonsuppurative otitis media, bilateral: Secondary | ICD-10-CM | POA: Diagnosis not present

## 2023-03-25 MED ORDER — DOXYCYCLINE HYCLATE 100 MG PO CAPS: 100.0000 mg | ORAL_CAPSULE | Freq: Two times a day (BID) | ORAL | 0 refills | Status: AC

## 2023-03-25 MED ORDER — FLUTICASONE PROPIONATE 50 MCG/ACT NA SUSP: 1.0000 | Freq: Every day | NASAL | 0 refills | Status: DC

## 2023-03-25 NOTE — ED Triage Notes (Signed)
 Pain and fullness in right ear. Pt states she had nausea, and dizziness that comes and goes x 5 days. Pt states she has a hx of vertigo. Taking sudafed and robitussin.

## 2023-03-25 NOTE — Discharge Instructions (Signed)
 Take the doxycycline as prescribed to treat the sinus infection.  Continue guaifenesin, saline nasal spray, neti pot with distilled water or sterile saline.  Start flonase nasal spray and continue running humidifier to help with congestion.  Seek care if symptoms do not improve with treatment.

## 2023-03-25 NOTE — ED Provider Notes (Signed)
 [facility]    CSN: [id number] Arrival date & time: 03/25/23  1349      History   Chief Complaint Chief Complaint  Patient presents with   Ear Fullness    Cough and nasal congestion - Entered by patient   Nausea    HPI Monica Soto is a 58 y.o. female.   Patient presents today with bilateral ear pain that initially began 3 weeks ago and was worse on the left side.  Reports at that time, she developed sinus pressure headache and fullness in her head that improved a little bit after her oncologist prescribed Augmentin and she took the entire course.  Reports the symptoms did not fully improve after the Augmentin.  Approximately 5 days ago, she had a vertigo spell from 2 to 7 PM, had room spinning sensation, lots of vomiting and a little bit of diarrhea that seemed to resolve quickly.  She reports the vertigo is now fully improved, but is still having bilateral ear pain and throbbing in the right ear when she lays down.  She endorses low-grade fevers initially when symptoms began, but this has resolved now.  No bodyaches or chills recently.  She is coughing up a bit of discolored mucus and blowing a lot of thick mucus out of her nasal passages.  She is using a Nettie pot and gets out a lot of thick discolored mucus.  No chest pain or tightness, shortness of breath, nausea, vomiting, or diarrhea recently.  Besides a Nettie pot, has been using saline spray, Robitussin which helps to break up the mucus.  Has also been taking Sudafed intermittently which she seems to help with symptoms temporarily.  She has also been running a humidifier but admits that she forgets to put water in it quite frequently.     Past Medical History:  Diagnosis Date   Depression    Diabetes mellitus without complication (HCC)    Erosive esophagitis 04/26/2010   EGD by Dr. Jena Gauss, small hiatal hernia   GERD (gastroesophageal reflux disease)    History of stomach ulcers    IBS (irritable bowel syndrome)     Diarrhea predominant   Microscopic colitis 04/26/2010   Colonoscopy by Dr. Jena Gauss, good response with Entocort   Tubular adenoma of colon 04/26/2010   Junction of descending and sigmoid 40 CM from anus    Patient Active Problem List   Diagnosis Date Noted   Genetic testing 07/12/2022   Malignant neoplasm of upper-outer quadrant of right breast in female, estrogen receptor positive (HCC) 07/03/2022   Lymphocytic colitis 05/30/2010   GERD 04/17/2010   IRRITABLE BOWEL SYNDROME 02/14/2010   DIARRHEA 02/14/2010   Abdominal pain 02/14/2010    Past Surgical History:  Procedure Laterality Date   ABDOMINAL HYSTERECTOMY     APPENDECTOMY  2/11   Dr. Malvin Johns with a delayed closure   BREAST BIOPSY Right 06/22/2022   [place] RT BREAST BX W LOC DEV 1ST LESION IMG BX SPEC US GUIDE 06/22/2022 [facility]   PORTACATH PLACEMENT N/A 07/16/2022   Procedure: INSERTION PORT-A-CATH;  Surgeon: Manus Rudd, MD;  Location: [facility];  Service: General;  Laterality: N/A;   TONSILLECTOMY     TUBAL LIGATION      OB History   No obstetric history on file.      Home Medications    Prior to Admission medications   Medication Sig Start Date End Date Taking? Authorizing Provider  Aspirin-Acetaminophen-Caffeine (GOODY HEADACHE PO) Take 1 packet  by mouth 2 (two) times daily as needed (for pain).   Yes [provider]  Cholecalciferol 1.25 MG (50000 UT) capsule Take 1 capsule by mouth once a week. 06/07/22  Yes [provider]  doxycycline (VIBRAMYCIN) 100 MG capsule Take 1 capsule (100 mg total) by mouth 2 (two) times daily for 7 days. 03/25/23 04/01/23 Yes Valentino Nose, NP  fluticasone (FLONASE) 50 MCG/ACT nasal spray Place 1 spray into both nostrils daily. 03/25/23  Yes Cathlean Marseilles A, NP  ondansetron (ZOFRAN) 8 MG tablet TAKE 1 TABLET(8 MG) BY MOUTH EVERY 8 HOURS AS NEEDED FOR NAUSEA OR VOMITING. START THIRD DAY AFTER DOXORUBICIN/ CYCLOPHOSPHAMIDE  CHEMOTHERAPY 12/24/22  Yes Iruku, Burnice Logan, MD  prochlorperazine (COMPAZINE) 10 MG tablet TAKE 1 TABLET(10 MG) BY MOUTH EVERY 6 HOURS AS NEEDED FOR NAUSEA OR VOMITING 03/25/23  Yes Iruku, Burnice Logan, MD  HYDROcodone-acetaminophen (NORCO/VICODIN) 5-325 MG tablet Take 1 tablet by mouth every 6 (six) hours as needed for moderate pain. Patient not taking: Reported on 03/25/2023 07/16/22   Manus Rudd, MD  lidocaine-prilocaine (EMLA) cream Apply to affected area once 07/04/22   Rachel Moulds, MD  albuterol (PROVENTIL HFA;VENTOLIN HFA) 108 (90 Base) MCG/ACT inhaler Inhale 2 puffs into the lungs every 4 (four) hours as needed for wheezing or shortness of breath. Patient not taking: Reported on 01/11/2016 04/28/15 09/11/18  Hayden Rasmussen, NP    Family History Family History  Problem Relation Age of Onset   Diabetes Mother    Coronary artery disease Mother    Healthy Father     Social History Social History   Tobacco Use   Smoking status: Every Day    Current packs/day: 0.50    Types: Cigarettes   Smokeless tobacco: Never  Vaping Use   Vaping status: Never Used  Substance Use Topics   Alcohol use: Yes    Alcohol/week: 1.0 standard drink of alcohol    Types: 1 Glasses of wine per week    Comment: seldom   Drug use: No     Allergies   Patient has no known allergies.   Review of Systems Review of Systems Per HPI  Physical Exam Triage Vital Signs ED Triage Vitals [03/25/23 1501]  Encounter Vitals Group     BP 117/67     Systolic BP Percentile      Diastolic BP Percentile      Pulse Rate 100     Resp 16     Temp 98.1 F (36.7 C)     Temp Source Oral     SpO2 96 %     Weight      Height      Head Circumference      Peak Flow      Pain Score 6     Pain Loc      Pain Education      Exclude from Growth Chart    No data found.  Updated Vital Signs BP 117/67 (BP Location: Left Arm)   Pulse 100   Temp 98.1 F (36.7 C) (Oral)   Resp 16   SpO2 96%   Visual Acuity Right  Eye Distance:   Left Eye Distance:   Bilateral Distance:    Right Eye Near:   Left Eye Near:    Bilateral Near:     Physical Exam Vitals and nursing note reviewed.  Constitutional:      General: She is not in acute distress.    Appearance: Normal appearance. She is not ill-appearing or  toxic-appearing.  HENT:     Head: Normocephalic and atraumatic.     Right Ear: Ear canal and external ear normal. A middle ear effusion is present. Tympanic membrane is not erythematous.     Left Ear: Ear canal and external ear normal. A middle ear effusion is present. Tympanic membrane is not erythematous.     Nose: Congestion present. No rhinorrhea.     Right Sinus: Maxillary sinus tenderness present. No frontal sinus tenderness.     Left Sinus: Maxillary sinus tenderness present. No frontal sinus tenderness.     Mouth/Throat:     Mouth: Mucous membranes are moist.     Pharynx: Oropharynx is clear. No oropharyngeal exudate or posterior oropharyngeal erythema.  Eyes:     General: No scleral icterus.    Extraocular Movements: Extraocular movements intact.  Cardiovascular:     Rate and Rhythm: Normal rate and regular rhythm.  Pulmonary:     Effort: Pulmonary effort is normal. No respiratory distress.     Breath sounds: Normal breath sounds. No wheezing, rhonchi or rales.  Abdominal:     General: Abdomen is flat.  Musculoskeletal:     Cervical back: Normal range of motion and neck supple.  Lymphadenopathy:     Cervical: No cervical adenopathy.  Skin:    General: Skin is warm and dry.     Capillary Refill: Capillary refill takes less than 2 seconds.     Coloration: Skin is not jaundiced or pale.     Findings: No erythema or rash.  Neurological:     Mental Status: She is alert and oriented to person, place, and time.     Motor: No weakness.  Psychiatric:        Behavior: Behavior is cooperative.      UC Treatments / Results  Labs (all labs ordered are listed, but only abnormal results  are displayed) Labs Reviewed - No data to display  EKG   Radiology No results found.  Procedures Procedures (including critical care time)  Medications Ordered in UC Medications - No data to display  Initial Impression / Assessment and Plan / UC Course  I have reviewed the triage vital signs and the nursing notes.  Pertinent labs & imaging results that were available during my care of the patient were reviewed by me and considered in my medical decision making (see chart for details).   Patient is well-appearing, normotensive, afebrile, not tachycardic, not tachypneic, oxygenating well on room air.    1. Acute bacterial sinusitis 2. Acute MEE (middle ear effusion), bilateral Treat with doxycycline twice daily for 7 days Continue supportive care with neti pot, saline spray, guaifenesin, humidifer Start flonase nasal spray to help with eustachian tube dysfunction  Return and ER precautions discussed  The patient was given the opportunity to ask questions.  All questions answered to their satisfaction.  The patient is in agreement to this plan.    Final Clinical Impressions(s) / UC Diagnoses   Final diagnoses:  Acute bacterial sinusitis  Acute MEE (middle ear effusion), bilateral     Discharge Instructions      Take the doxycycline as prescribed to treat the sinus infection.  Continue guaifenesin, saline nasal spray, neti pot with distilled water or sterile saline.  Start flonase nasal spray and continue running humidifier to help with congestion.  Seek care if symptoms do not improve with treatment.     ED Prescriptions     Medication Sig Dispense Auth. Provider   doxycycline (VIBRAMYCIN) 100  MG capsule Take 1 capsule (100 mg total) by mouth 2 (two) times daily for 7 days. 14 capsule Cathlean Marseilles A, NP   fluticasone (FLONASE) 50 MCG/ACT nasal spray Place 1 spray into both nostrils daily. 16 g Valentino Nose, NP      PDMP not reviewed this encounter.    Valentino Nose, NP 03/25/23 [phone]

## 2023-03-29 ENCOUNTER — Other Ambulatory Visit: Payer: Self-pay

## 2023-04-02 ENCOUNTER — Other Ambulatory Visit: Payer: Self-pay

## 2023-04-03 ENCOUNTER — Other Ambulatory Visit: Payer: Self-pay

## 2023-04-03 NOTE — Progress Notes (Signed)
 [facility] [address] [place] [address]    Clinic Day:  04/04/2023  Referring physician: Assunta Found, MD  Patient Care Team: Assunta Found, MD as PCP - General (Family Medicine) Jena Gauss, Gerrit Friends, MD (Gastroenterology) Antony Blackbird, MD as Consulting Physician (Radiation Oncology) Manus Rudd, MD as Consulting Physician (General Surgery) Pershing Proud, RN as Oncology Nurse Navigator Donnelly Angelica, RN as Oncology Nurse Navigator Doreatha Massed, MD as Medical Oncologist (Medical Oncology) Therese Sarah, RN as Oncology Nurse Navigator (Medical Oncology)   ASSESSMENT & PLAN:   Assessment: 1.  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  Noticed skin changes in the right breast 3 months ago. - Mammogram (06/19/2022): Large 8-9 cm mass in the right breast with 2 abnormal axillary lymph nodes and skin thickening. 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  1.8 cm ill-defined enhancing mass in the lateral left breast suspicious for additional malignancy.  Right breast mass involves right chest wall including pectoralis and intercostal muscles.  Prominent subcentimeter left axillary lymph nodes. - PET scan (07/12/2022): Intensely hypermetabolic right breast mass with right axillary lymph nodes.  Hypermetabolic metastatic lymph node in the posterior triangle of the right neck.  Hypermetabolic left axillary lymph nodes.  No evidence of metastatic adenopathy in the mediastinum/abdomen/pelvis.  No evidence of visceral metastasis.  Multiple skeletal metastasis:  Left sacral ala 2.5 cm, left iliac bone, L4 and T12 vertebral bodies, multiple rib lesions, left scapular lesion. - She was evaluated by Dr. Al Pimple and was recommended treatment with ribociclib and anastrozole. - Germline mutation testing: Negative. - Anastrozole and ribociclib started on 07/25/2022, discontinued on 12/13/2022 due to progression. - PET scan (11/15/2022): Mixed response to therapy with overall progression of disease with new bone metastasis and liver metastasis.  New periportal lymph nodes. - Liver biopsy (12/07/2022): Metastatic poorly differentiated adenocarcinoma consistent with breast primary.  ER +2% with moderate staining, PR 0%, HER2 (1+). - Guardant360: APC G1312, T p53, MYC amplification, FGF R2 amplification, EGFR amplification - PD-L1 (22 C3): 0% - Abraxane day 1, day 8 every 21 days started on 12/21/2022, carboplatin AUC on day 1 and day 8 added with cycle 2 on 01/10/2023 - NGS (12/07/2022): MS-stable, TMB-low, PTEN pathogenic variant, genomic LOH high (18%), HER2 (1+), PD-L1 (22 C3) CPS-10.    2.  Social/family history: - She lives at home with her husband.  Used OCP for approximately 16-18 years.  Underwent hysterectomy after endometrial ablation when she was found to have fibroids causing pain.  No family history of breast or other malignancies.  Smokes 1 pack of cigarettes per day.    Plan: 1.  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 - PET scan (03/04/2023): Mixed response with new hypermetabolic focus in the gallbladder fossa and mildly hypermetabolic porta hepatis adenopathy. - Cycle 1 of carboplatin, Abraxane and Keytruda on 03/14/2023. - She felt itching for 1 day generalized after first dose of Keytruda. - Denies any neuropathy.  Has swelling of dorsum of both hands from lymphedema. - Reviewed labs today: White count  3.7 with normal ANC.  Platelet count is normal.  Hemoglobin 9.6.  LFTs are normal. - He may  proceed with cycle 2 today.  RTC 3 weeks for follow-up.    2.  Bone metastasis: - Denies any jaw pains or dental issues.  Continue calcium supplements.  Continue Zometa every 12 weeks.  Calcium is 8.4 today.  3.  Hypomagnesemia: - Magnesium is consistently low for the last 2 times.  She will receive IV magnesium today. - Will start her on magnesium oxide twice daily.  4.  Hypoalbuminemia: - Albumin is low at 2.9 contributing to edema.  She will start Premier protein 1 can per day.     No orders of the defined types were placed in this encounter.     Alben Deeds Teague,acting as a Neurosurgeon for Doreatha Massed, MD.,have documented all relevant documentation on the behalf of Doreatha Massed, MD,as directed by  Doreatha Massed, MD while in the presence of Doreatha Massed, MD.  I, Doreatha Massed MD, have reviewed the above documentation for accuracy and completeness, and I agree with the above.       Doreatha Massed, MD   2/27/202511:01 AM  CHIEF COMPLAINT:   Diagnosis: metastatic inflammatory right breast cancer    Cancer Staging  Malignant neoplasm of upper-outer quadrant of right breast in female, estrogen receptor positive (HCC) Staging form: Breast, AJCC 8th Edition - Clinical: Stage IV (cT4, cN2, cM1, G2, ER+, PR+, HER2-) - Signed by Rachel Moulds, MD on 07/17/2022    Prior Therapy: none  Current Therapy:  Anastrozole and ribociclib    HISTORY OF PRESENT ILLNESS:   Oncology History  Malignant neoplasm of upper-outer quadrant of right breast in female, estrogen receptor positive (HCC)  06/19/2022 Mammogram   Mammogram showed large mass in the right breast which appears to measure 8 to 9 cm on ultrasound, 2 abnormal right axillary lymph node and a third borderline lymph node.  Skin thickening noted along the medial aspect of left breast which may be an extension of the marked skin thickening diffusely on the right.  Otherwise no left breast  abnormalities are identified.   06/22/2022 Pathology Results   Needle core biopsy from the right breast upper outer quadrant showed grade 2 IDC, both lymph nodes with metastatic adenocarcinoma.  Prognostic showed ER 90% positive strong staining PR 70% positive strong staining Ki-67 of 20% HER2 negative by IHC 1+   07/03/2022 Initial Diagnosis   Malignant neoplasm of upper-outer quadrant of right breast in female, estrogen receptor positive (HCC)   07/04/2022 Cancer Staging   Staging form: Breast, AJCC 8th Edition - Clinical: Stage IV (cT4, cN2, cM1, G2, ER+, PR+, HER2-) - Signed by Rachel Moulds, MD on 07/17/2022 Stage prefix: Initial diagnosis Histologic grading system: 3 grade system   07/16/2022 - 07/16/2022 Chemotherapy   Patient is on Treatment Plan : BREAST ADJUVANT DOSE DENSE AC q14d / PACLitaxel q7d      Genetic Testing   [facility] Custom Panel+RNA was Negative. Report date is 07/10/2022.  The Custom Hereditary Cancers Panel offered by [facility] includes sequencing and/or deletion duplication testing of the following 43 genes: APC, ATM, AXIN2, BAP1, BARD1, BMPR1A, BRCA1, BRCA2, BRIP1, CDH1, CDK4, CDKN2A (p14ARF and p16INK4a only), CHEK2, CTNNA1, EPCAM (Deletion/duplication testing only), FH, GREM1 (promoter region duplication testing only), HOXB13, KIT, MBD4, MEN1, MLH1, MSH2, MSH3, MSH6, MUTYH, NF1, NHTL1, PALB2, PDGFRA, PMS2, POLD1, POLE, PTEN, RAD51C, RAD51D, SMAD4, SMARCA4. STK11, TP53, TSC1, TSC2, and VHL.   12/13/2022 - 12/13/2022 Chemotherapy  Patient is on Treatment Plan : BREAST METASTATIC Fam-Trastuzumab Deruxtecan-nxki (Enhertu) (5.4) q21d     12/21/2022 -  Chemotherapy   Patient is on Treatment Plan : BREAST Paclitaxel-Albumin (Abraxane) (100) D1,8 + Carboplatin AUC2 D1,8 every 3 weeks, q21d        INTERVAL HISTORY:   Nanna is a 58 y.o. female presenting to clinic today for follow up of metastatic inflammatory right breast cancer. She was last seen by me on 03/14/23.  Since  her last visit, she presented to urgent care on 03/25/23 for acute bacterial sinusitis and was prescribed doxycycline 100 mg bid x 7 days.   Today, she states that she is doing well overall. Her appetite level is at 90%. Her energy level is at 50%.   Chanay denies any side effects from chemotherapy, including jaw pain or dental issues. She does report generalized itchiness on the body 1 day after Keytruda. Paizlee denies any rash. She took Benadryl once, which resolved symptoms.   Bethania reports erythema on the bilateral breasts and occasional discharge on lower outer quadrant of right breast. She keeps the sites moisturized and bandages.  She reports tingling and numbness in bilateral hands she attributes to lymphedema. Symptoms improve as the day progresses and swelling goes down. Tesha also notes bilateral ankle swellings. She would like to know if she should drink Boost/Ensure.   She notes mild diarrhea she attributes to IV magnesium, occasionally given prior to treatment when levels are low.   PAST MEDICAL HISTORY:   Past Medical History: Past Medical History:  Diagnosis Date   Depression    Diabetes mellitus without complication (HCC)    Erosive esophagitis 04/26/2010   EGD by Dr. Jena Gauss, small hiatal hernia   GERD (gastroesophageal reflux disease)    History of stomach ulcers    IBS (irritable bowel syndrome)    Diarrhea predominant   Microscopic colitis 04/26/2010   Colonoscopy by Dr. Jena Gauss, good response with Entocort   Tubular adenoma of colon 04/26/2010   Junction of descending and sigmoid 40 CM from anus    Surgical History: Past Surgical History:  Procedure Laterality Date   ABDOMINAL HYSTERECTOMY     APPENDECTOMY  2/11   Dr. Malvin Johns with a delayed closure   BREAST BIOPSY Right 06/22/2022   [place] RT BREAST BX W LOC DEV 1ST LESION IMG BX SPEC US GUIDE 06/22/2022 [facility] MAMMOGRAPHY   PORTACATH PLACEMENT N/A 07/16/2022   Procedure: INSERTION PORT-A-CATH;  Surgeon: Manus Rudd,  MD;  Location: [facility];  Service: General;  Laterality: N/A;   TONSILLECTOMY     TUBAL LIGATION      Social History: Social History   Socioeconomic History   Marital status: Married    Spouse name: Not on file   Number of children: Not on file   Years of education: Not on file   Highest education level: Not on file  Occupational History    Employer: [employer]    Comment: Third shift  Tobacco Use   Smoking status: Every Day    Current packs/day: 0.50    Types: Cigarettes   Smokeless tobacco: Never  Vaping Use   Vaping status: Never Used  Substance and Sexual Activity   Alcohol use: Yes    Alcohol/week: 1.0 standard drink of alcohol    Types: 1 Glasses of wine per week    Comment: seldom   Drug use: No   Sexual activity: Yes    Birth control/protection: Surgical  Other Topics  Concern   Not on file  Social History Narrative   Not on file   Social Drivers of Health   Financial Resource Strain: Not on file  Food Insecurity: No Food Insecurity (07/04/2022)   Hunger Vital Sign    Worried About Running Out of Food in the Last Year: Never true    Ran Out of Food in the Last Year: Never true  Transportation Needs: No Transportation Needs (07/04/2022)   PRAPARE - Administrator, Civil Service (Medical): No    Lack of Transportation (Non-Medical): No  Physical Activity: Not on file  Stress: Not on file  Social Connections: Not on file  Intimate Partner Violence: Not on file    Family History: Family History  Problem Relation Age of Onset   Diabetes Mother    Coronary artery disease Mother    Healthy Father     Current Medications:  Current Outpatient Medications:    Aspirin-Acetaminophen-Caffeine (GOODY HEADACHE PO), Take 1 packet by mouth 2 (two) times daily as needed (for pain)., Disp: , Rfl:    Cholecalciferol 1.25 MG (50000 UT) capsule, Take 1 capsule by mouth once a week., Disp: , Rfl:    fluticasone (FLONASE) 50 MCG/ACT  nasal spray, Place 1 spray into both nostrils daily., Disp: 16 g, Rfl: 0   HYDROcodone-acetaminophen (NORCO/VICODIN) 5-325 MG tablet, Take 1 tablet by mouth every 6 (six) hours as needed for moderate pain., Disp: 15 tablet, Rfl: 0   lidocaine-prilocaine (EMLA) cream, Apply to affected area once, Disp: 30 g, Rfl: 3   magnesium oxide (MAG-OX) 400 (240 Mg) MG tablet, Take 1 tablet (400 mg total) by mouth 2 (two) times daily., Disp: 60 tablet, Rfl: 3   ondansetron (ZOFRAN) 8 MG tablet, TAKE 1 TABLET(8 MG) BY MOUTH EVERY 8 HOURS AS NEEDED FOR NAUSEA OR VOMITING. START THIRD DAY AFTER DOXORUBICIN/ CYCLOPHOSPHAMIDE CHEMOTHERAPY, Disp: 30 tablet, Rfl: 1   prochlorperazine (COMPAZINE) 10 MG tablet, TAKE 1 TABLET(10 MG) BY MOUTH EVERY 6 HOURS AS NEEDED FOR NAUSEA OR VOMITING, Disp: 30 tablet, Rfl: 1 No current facility-administered medications for this visit.  Facility-Administered Medications Ordered in Other Visits:    0.9 %  sodium chloride infusion, , Intravenous, Continuous, Doreatha Massed, MD, Stopped at 03/14/23 1450   0.9 %  sodium chloride infusion, , Intravenous, Continuous, Doreatha Massed, MD, Last Rate: 10 mL/hr at 04/04/23 1024, New Bag at 04/04/23 1024   CARBOplatin (PARAPLATIN) 240 mg in sodium chloride 0.9 % 100 mL chemo infusion, 240 mg, Intravenous, Once, Doreatha Massed, MD   dexamethasone (DECADRON) injection 10 mg, 10 mg, Intravenous, Once, Doreatha Massed, MD   heparin lock flush 100 unit/mL, 500 Units, Intracatheter, Once PRN, Doreatha Massed, MD   magnesium sulfate IVPB 4 g 100 mL, 4 g, Intravenous, Once, Doreatha Massed, MD, Last Rate: 100 mL/hr at 04/04/23 1025, 4 g at 04/04/23 1025   PACLitaxel-protein bound (ABRAXANE) chemo infusion 225 mg, 125 mg/m2 (Treatment Plan Recorded), Intravenous, Once, Doreatha Massed, MD   palonosetron (ALOXI) injection 0.25 mg, 0.25 mg, Intravenous, Once, Doreatha Massed, MD   pembrolizumab [facility]) 200 mg in  sodium chloride 0.9 % 50 mL chemo infusion, 200 mg, Intravenous, Once, Doreatha Massed, MD   potassium chloride SA (KLOR-CON M) CR tablet 40 mEq, 40 mEq, Oral, Once, Doreatha Massed, MD   sodium chloride flush (NS) 0.9 % injection 10 mL, 10 mL, Intracatheter, PRN, Doreatha Massed, MD, 10 mL at 03/14/23 1449   sodium chloride flush (NS) 0.9 % injection 10 mL,  10 mL, Intravenous, PRN, Doreatha Massed, MD, 10 mL at 03/21/23 1237   Allergies: No Known Allergies  REVIEW OF SYSTEMS:   Review of Systems  Constitutional:  Negative for chills, fatigue and fever.  HENT:   Negative for lump/mass, mouth sores, nosebleeds, sore throat and trouble swallowing.   Eyes:  Negative for eye problems.  Respiratory:  Negative for cough and shortness of breath.   Cardiovascular:  Negative for chest pain, leg swelling and palpitations.  Gastrointestinal:  Positive for diarrhea and nausea. Negative for abdominal pain, constipation and vomiting.  Genitourinary:  Negative for bladder incontinence, difficulty urinating, dysuria, frequency, hematuria and nocturia.   Musculoskeletal:  Negative for arthralgias, back pain, flank pain, myalgias and neck pain.  Skin:  Negative for itching and rash.  Neurological:  Positive for dizziness and headaches. Negative for numbness.  Hematological:  Does not bruise/bleed easily.  Psychiatric/Behavioral:  Negative for depression, sleep disturbance and suicidal ideas. The patient is not nervous/anxious.   All other systems reviewed and are negative.    VITALS:   There were no vitals taken for this visit.  Wt Readings from Last 3 Encounters:  04/04/23 165 lb 9.6 oz (75.1 kg)  03/21/23 160 lb 15 oz (73 kg)  03/14/23 160 lb 7.9 oz (72.8 kg)    There is no height or weight on file to calculate BMI.  Performance status (ECOG): 1 - Symptomatic but completely ambulatory   PHYSICAL EXAM:   Physical Exam Vitals and nursing note reviewed. Exam conducted with  a chaperone present.  Constitutional:      Appearance: Normal appearance.  Cardiovascular:     Rate and Rhythm: Normal rate and regular rhythm.     Pulses: Normal pulses.     Heart sounds: Normal heart sounds.  Pulmonary:     Effort: Pulmonary effort is normal.     Breath sounds: Normal breath sounds.  Chest:     Comments: +erythema on bilateral breasts Abdominal:     Palpations: Abdomen is soft. There is no hepatomegaly, splenomegaly or mass.     Tenderness: There is no abdominal tenderness.  Musculoskeletal:     Right lower leg: 1+ Edema present.     Left lower leg: 1+ Edema present.  Lymphadenopathy:     Cervical: No cervical adenopathy.     Right cervical: No superficial, deep or posterior cervical adenopathy.    Left cervical: No superficial, deep or posterior cervical adenopathy.     Upper Body:     Right upper body: No supraclavicular or axillary adenopathy.     Left upper body: No supraclavicular or axillary adenopathy.  Neurological:     General: No focal deficit present.     Mental Status: She is alert and oriented to person, place, and time.  Psychiatric:        Mood and Affect: Mood normal.        Behavior: Behavior normal.     LABS:      Latest Ref Rng & Units 04/04/2023    8:56 AM 03/21/2023   12:38 PM 03/14/2023    9:54 AM  CBC  WBC 4.0 - 10.5 K/uL 3.7  3.8  4.9   Hemoglobin 12.0 - 15.0 g/dL 9.6  9.2  9.9   Hematocrit 36.0 - 46.0 % 30.1  27.6  29.8   Platelets 150 - 400 K/uL 219  158  190       Latest Ref Rng & Units 04/04/2023    8:56 AM 03/21/2023  1:00 PM 03/14/2023    9:54 AM  CMP  Glucose 70 - 99 mg/dL 644  034  742   BUN 6 - 20 mg/dL 9  8  19    Creatinine 0.44 - 1.00 mg/dL 5.95  6.38  7.56   Sodium 135 - 145 mmol/L 136  136  133   Potassium 3.5 - 5.1 mmol/L 3.4  3.3  3.1   Chloride 98 - 111 mmol/L 104  105  100   CO2 22 - 32 mmol/L 21  22  20    Calcium 8.9 - 10.3 mg/dL 8.4  7.9  8.4   Total Protein 6.5 - 8.1 g/dL 6.9  6.3  6.5   Total  Bilirubin 0.0 - 1.2 mg/dL 0.5  0.4  0.4   Alkaline Phos 38 - 126 U/L 104  86  103   AST 15 - 41 U/L 23  24  22    ALT 0 - 44 U/L 14  21  16       No results found for: "CEA1", "CEA" / No results found for: "CEA1", "CEA" No results found for: "PSA1" No results found for: "EPP295" No results found for: "CAN125"  No results found for: "TOTALPROTELP", "ALBUMINELP", "A1GS", "A2GS", "BETS", "BETA2SER", "GAMS", "MSPIKE", "SPEI" No results found for: "TIBC", "FERRITIN", "IRONPCTSAT" No results found for: "LDH"   STUDIES:   No results found.

## 2023-04-04 ENCOUNTER — Inpatient Hospital Stay: Payer: 59

## 2023-04-04 ENCOUNTER — Encounter (HOSPITAL_COMMUNITY): Payer: Self-pay | Admitting: Hematology

## 2023-04-04 ENCOUNTER — Inpatient Hospital Stay (HOSPITAL_BASED_OUTPATIENT_CLINIC_OR_DEPARTMENT_OTHER): Payer: 59 | Admitting: Hematology

## 2023-04-04 VITALS — BP 151/80 | HR 89 | Resp 19

## 2023-04-04 DIAGNOSIS — Z170 Estrogen receptor positive status [ER+]: Secondary | ICD-10-CM

## 2023-04-04 DIAGNOSIS — C50411 Malignant neoplasm of upper-outer quadrant of right female breast: Secondary | ICD-10-CM | POA: Diagnosis not present

## 2023-04-04 DIAGNOSIS — Z5112 Encounter for antineoplastic immunotherapy: Secondary | ICD-10-CM | POA: Diagnosis not present

## 2023-04-04 LAB — CBC WITH DIFFERENTIAL/PLATELET
Abs Immature Granulocytes: 0.03 10*3/uL (ref 0.00–0.07)
Basophils Absolute: 0 10*3/uL (ref 0.0–0.1)
Basophils Relative: 1 %
Eosinophils Absolute: 0 10*3/uL (ref 0.0–0.5)
Eosinophils Relative: 1 %
HCT: 30.1 % — ABNORMAL LOW (ref 36.0–46.0)
Hemoglobin: 9.6 g/dL — ABNORMAL LOW (ref 12.0–15.0)
Immature Granulocytes: 1 %
Lymphocytes Relative: 35 %
Lymphs Abs: 1.3 10*3/uL (ref 0.7–4.0)
MCH: 31.4 pg (ref 26.0–34.0)
MCHC: 31.9 g/dL (ref 30.0–36.0)
MCV: 98.4 fL (ref 80.0–100.0)
Monocytes Absolute: 0.3 10*3/uL (ref 0.1–1.0)
Monocytes Relative: 8 %
Neutro Abs: 2 10*3/uL (ref 1.7–7.7)
Neutrophils Relative %: 54 %
Platelets: 219 10*3/uL (ref 150–400)
RBC: 3.06 MIL/uL — ABNORMAL LOW (ref 3.87–5.11)
RDW: 21.1 % — ABNORMAL HIGH (ref 11.5–15.5)
WBC: 3.7 10*3/uL — ABNORMAL LOW (ref 4.0–10.5)
nRBC: 0 % (ref 0.0–0.2)

## 2023-04-04 LAB — COMPREHENSIVE METABOLIC PANEL
ALT: 14 U/L (ref 0–44)
AST: 23 U/L (ref 15–41)
Albumin: 2.9 g/dL — ABNORMAL LOW (ref 3.5–5.0)
Alkaline Phosphatase: 104 U/L (ref 38–126)
Anion gap: 11 (ref 5–15)
BUN: 9 mg/dL (ref 6–20)
CO2: 21 mmol/L — ABNORMAL LOW (ref 22–32)
Calcium: 8.4 mg/dL — ABNORMAL LOW (ref 8.9–10.3)
Chloride: 104 mmol/L (ref 98–111)
Creatinine, Ser: 0.65 mg/dL (ref 0.44–1.00)
GFR, Estimated: >60 mL/min (ref >60)
Glucose, Bld: 120 mg/dL — ABNORMAL HIGH (ref 70–99)
Potassium: 3.4 mmol/L — ABNORMAL LOW (ref 3.5–5.1)
Sodium: 136 mmol/L (ref 135–145)
Total Bilirubin: 0.5 mg/dL (ref 0.0–1.2)
Total Protein: 6.9 g/dL (ref 6.5–8.1)

## 2023-04-04 LAB — MAGNESIUM: Magnesium: 1.4 mg/dL — ABNORMAL LOW (ref 1.7–2.4)

## 2023-04-04 MED ORDER — PALONOSETRON HCL INJECTION 0.25 MG/5ML
0.2500 mg | Freq: Once | INTRAVENOUS | Status: AC
  Administered 2023-04-04: 0.25 mg via INTRAVENOUS
  Filled 2023-04-04: qty 5

## 2023-04-04 MED ORDER — MAGNESIUM SULFATE 4 GM/100ML IV SOLN
4.0000 g | Freq: Once | INTRAVENOUS | Status: DC
  Filled 2023-04-04: qty 100

## 2023-04-04 MED ORDER — PACLITAXEL PROTEIN-BOUND CHEMO INJECTION 100 MG
125.0000 mg/m2 | Freq: Once | INTRAVENOUS | Status: AC
Start: 2023-04-04 — End: 2023-04-04
  Administered 2023-04-04: 225 mg via INTRAVENOUS
  Filled 2023-04-04: qty 45

## 2023-04-04 MED ORDER — MAGNESIUM SULFATE 4 GM/100ML IV SOLN
4.0000 g | Freq: Once | INTRAVENOUS | Status: AC
  Administered 2023-04-04: 4 g via INTRAVENOUS

## 2023-04-04 MED ORDER — POTASSIUM CHLORIDE CRYS ER 20 MEQ PO TBCR
40.0000 meq | EXTENDED_RELEASE_TABLET | Freq: Once | ORAL | Status: AC
  Administered 2023-04-04: 40 meq via ORAL
  Filled 2023-04-04: qty 2

## 2023-04-04 MED ORDER — MAGNESIUM OXIDE -MG SUPPLEMENT 400 (240 MG) MG PO TABS: 400.0000 mg | ORAL_TABLET | Freq: Two times a day (BID) | ORAL | 3 refills | Status: AC

## 2023-04-04 MED ORDER — DEXAMETHASONE SODIUM PHOSPHATE 10 MG/ML IJ SOLN
10.0000 mg | Freq: Once | INTRAMUSCULAR | Status: AC
  Administered 2023-04-04: 10 mg via INTRAVENOUS
  Filled 2023-04-04: qty 1

## 2023-04-04 MED ORDER — HEPARIN SOD (PORK) LOCK FLUSH 100 UNIT/ML IV SOLN
500.0000 [IU] | Freq: Once | INTRAVENOUS | Status: AC | PRN
Start: 2023-04-04 — End: 2023-04-04
  Administered 2023-04-04: 500 [IU]

## 2023-04-04 MED ORDER — SODIUM CHLORIDE 0.9 % IV SOLN
235.0000 mg | Freq: Once | INTRAVENOUS | Status: AC
  Administered 2023-04-04: 240 mg via INTRAVENOUS
  Filled 2023-04-04: qty 24

## 2023-04-04 MED ORDER — SODIUM CHLORIDE 0.9 % IV SOLN: INTRAVENOUS | Status: DC

## 2023-04-04 MED ORDER — SODIUM CHLORIDE 0.9 % IV SOLN
200.0000 mg | Freq: Once | INTRAVENOUS | Status: AC
  Administered 2023-04-04: 200 mg via INTRAVENOUS
  Filled 2023-04-04: qty 8

## 2023-04-04 MED ORDER — SODIUM CHLORIDE 0.9% FLUSH
10.0000 mL | Freq: Once | INTRAVENOUS | Status: AC
  Administered 2023-04-04: 10 mL via INTRAVENOUS

## 2023-04-04 NOTE — Patient Instructions (Signed)

## 2023-04-04 NOTE — Patient Instructions (Signed)
 [facility] [place] - A DEPT OF [facility]  Discharge Instructions: Thank you for choosing [facility] to provide your oncology and hematology care.  If you have a lab appointment with the Cancer Center - please note that after April 8th, 2024, all labs will be drawn in the cancer center.  You do not have to check in or register with the main entrance as you have in the past but will complete your check-in in the cancer center.  Wear comfortable clothing and clothing appropriate for easy access to any Portacath or PICC line.   We strive to give you quality time with your provider. You may need to reschedule your appointment if you arrive late (15 or more minutes).  Arriving late affects you and other patients whose appointments are after yours.  Also, if you miss three or more appointments without notifying the office, you may be dismissed from the clinic at the provider's discretion.      For prescription refill requests, have your pharmacy contact our office and allow 72 hours for refills to be completed.    Today you received the following chemotherapy and/or immunotherapy agents Keytruda, Abraxane,, Carboplatin   To help prevent nausea and vomiting after your treatment, we encourage you to take your nausea medication as directed.  BELOW ARE SYMPTOMS THAT SHOULD BE REPORTED IMMEDIATELY: *FEVER GREATER THAN 100.4 F (38 C) OR HIGHER *CHILLS OR SWEATING *NAUSEA AND VOMITING THAT IS NOT CONTROLLED WITH YOUR NAUSEA MEDICATION *UNUSUAL SHORTNESS OF BREATH *UNUSUAL BRUISING OR BLEEDING *URINARY PROBLEMS (pain or burning when urinating, or frequent urination) *BOWEL PROBLEMS (unusual diarrhea, constipation, pain near the anus) TENDERNESS IN MOUTH AND THROAT WITH OR WITHOUT PRESENCE OF ULCERS (sore throat, sores in mouth, or a toothache) UNUSUAL RASH, SWELLING OR PAIN  UNUSUAL VAGINAL DISCHARGE OR ITCHING   Items with * indicate a potential emergency and  should be followed up as soon as possible or go to the Emergency Department if any problems should occur.  Please show the CHEMOTHERAPY ALERT CARD or IMMUNOTHERAPY ALERT CARD at check-in to the Emergency Department and triage nurse.  Should you have questions after your visit or need to cancel or reschedule your appointment, please contact [facility] [place] - A DEPT OF Eligha Bridegroom [facility] [phone]  and follow the prompts.  Office hours are 8:00 a.m. to 4:30 p.m. Monday - Friday. Please note that voicemails left after 4:00 p.m. may not be returned until the following business day.  We are closed weekends and major holidays. You have access to a nurse at all times for urgent questions. Please call the main number to the clinic [phone] and follow the prompts.  For any non-urgent questions, you may also contact your provider using MyChart. We now offer e-Visits for anyone 83 and older to request care online for non-urgent symptoms. For details visit [email or web address].   Also download the MyChart app! Go to the app store, search "MyChart", open the app, select [place], and log in with your MyChart username and password.

## 2023-04-04 NOTE — Progress Notes (Signed)
 Patient has been examined by Dr. Ellin Saba. Vital signs (HR 108) and labs have been reviewed by MD - ANC, Creatinine, LFTs, hemoglobin, and platelets are within treatment parameters per M.D. - pt may proceed with treatment.  Primary RN and pharmacy notified.

## 2023-04-04 NOTE — Progress Notes (Signed)
 Magnesium 1.4 and Potassium 3.4- Dr Ellin Saba standing orders placed. Pt agrees to plan.   Patient tolerated chemotherapy with no complaints voiced.  Side effects with management reviewed with understanding verbalized.  Port site clean and dry with no bruising or swelling noted at site.  Good blood return noted before and after administration of chemotherapy.  Band aid applied.  Patient left in satisfactory condition with VSS and no s/s of distress noted. All follow ups as scheduled.   Monica Soto [employer]

## 2023-04-05 LAB — CANCER ANTIGEN 27.29: CA 27.29: 412.6 U/mL — ABNORMAL HIGH (ref 0.0–38.6)

## 2023-04-05 LAB — CANCER ANTIGEN 15-3: CA 15-3: 269 U/mL — ABNORMAL HIGH (ref 0.0–25.0)

## 2023-04-09 ENCOUNTER — Other Ambulatory Visit: Payer: Self-pay

## 2023-04-11 ENCOUNTER — Inpatient Hospital Stay: Payer: 59

## 2023-04-11 ENCOUNTER — Inpatient Hospital Stay: Payer: 59 | Attending: Hematology and Oncology

## 2023-04-11 VITALS — BP 142/82 | HR 88 | Temp 98.1°F | Resp 18

## 2023-04-11 DIAGNOSIS — Z1732 Human epidermal growth factor receptor 2 negative status: Secondary | ICD-10-CM | POA: Diagnosis not present

## 2023-04-11 DIAGNOSIS — Z90710 Acquired absence of both cervix and uterus: Secondary | ICD-10-CM | POA: Insufficient documentation

## 2023-04-11 DIAGNOSIS — K30 Functional dyspepsia: Secondary | ICD-10-CM | POA: Insufficient documentation

## 2023-04-11 DIAGNOSIS — R202 Paresthesia of skin: Secondary | ICD-10-CM | POA: Insufficient documentation

## 2023-04-11 DIAGNOSIS — Z5112 Encounter for antineoplastic immunotherapy: Secondary | ICD-10-CM | POA: Insufficient documentation

## 2023-04-11 DIAGNOSIS — R7401 Elevation of levels of liver transaminase levels: Secondary | ICD-10-CM | POA: Insufficient documentation

## 2023-04-11 DIAGNOSIS — Z860101 Personal history of adenomatous and serrated colon polyps: Secondary | ICD-10-CM | POA: Diagnosis not present

## 2023-04-11 DIAGNOSIS — Z170 Estrogen receptor positive status [ER+]: Secondary | ICD-10-CM

## 2023-04-11 DIAGNOSIS — Z5111 Encounter for antineoplastic chemotherapy: Secondary | ICD-10-CM | POA: Diagnosis present

## 2023-04-11 DIAGNOSIS — M7989 Other specified soft tissue disorders: Secondary | ICD-10-CM | POA: Diagnosis not present

## 2023-04-11 DIAGNOSIS — Z1721 Progesterone receptor positive status: Secondary | ICD-10-CM | POA: Insufficient documentation

## 2023-04-11 DIAGNOSIS — C787 Secondary malignant neoplasm of liver and intrahepatic bile duct: Secondary | ICD-10-CM | POA: Insufficient documentation

## 2023-04-11 DIAGNOSIS — R599 Enlarged lymph nodes, unspecified: Secondary | ICD-10-CM | POA: Insufficient documentation

## 2023-04-11 DIAGNOSIS — F17210 Nicotine dependence, cigarettes, uncomplicated: Secondary | ICD-10-CM | POA: Diagnosis not present

## 2023-04-11 DIAGNOSIS — Z79811 Long term (current) use of aromatase inhibitors: Secondary | ICD-10-CM | POA: Insufficient documentation

## 2023-04-11 DIAGNOSIS — C50411 Malignant neoplasm of upper-outer quadrant of right female breast: Secondary | ICD-10-CM | POA: Insufficient documentation

## 2023-04-11 DIAGNOSIS — C7951 Secondary malignant neoplasm of bone: Secondary | ICD-10-CM | POA: Diagnosis not present

## 2023-04-11 DIAGNOSIS — Z8249 Family history of ischemic heart disease and other diseases of the circulatory system: Secondary | ICD-10-CM | POA: Insufficient documentation

## 2023-04-11 DIAGNOSIS — Z9049 Acquired absence of other specified parts of digestive tract: Secondary | ICD-10-CM | POA: Diagnosis not present

## 2023-04-11 DIAGNOSIS — K589 Irritable bowel syndrome without diarrhea: Secondary | ICD-10-CM | POA: Diagnosis not present

## 2023-04-11 DIAGNOSIS — R200 Anesthesia of skin: Secondary | ICD-10-CM | POA: Diagnosis not present

## 2023-04-11 DIAGNOSIS — M533 Sacrococcygeal disorders, not elsewhere classified: Secondary | ICD-10-CM | POA: Diagnosis not present

## 2023-04-11 DIAGNOSIS — Z79899 Other long term (current) drug therapy: Secondary | ICD-10-CM | POA: Insufficient documentation

## 2023-04-11 DIAGNOSIS — Z833 Family history of diabetes mellitus: Secondary | ICD-10-CM | POA: Insufficient documentation

## 2023-04-11 LAB — CBC WITH DIFFERENTIAL/PLATELET
Abs Immature Granulocytes: 0.21 10*3/uL — ABNORMAL HIGH (ref 0.00–0.07)
Basophils Absolute: 0 10*3/uL (ref 0.0–0.1)
Basophils Relative: 1 %
Eosinophils Absolute: 0.1 10*3/uL (ref 0.0–0.5)
Eosinophils Relative: 1 %
HCT: 29.2 % — ABNORMAL LOW (ref 36.0–46.0)
Hemoglobin: 9.5 g/dL — ABNORMAL LOW (ref 12.0–15.0)
Immature Granulocytes: 5 %
Lymphocytes Relative: 26 %
Lymphs Abs: 1.1 10*3/uL (ref 0.7–4.0)
MCH: 31.9 pg (ref 26.0–34.0)
MCHC: 32.5 g/dL (ref 30.0–36.0)
MCV: 98 fL (ref 80.0–100.0)
Monocytes Absolute: 0.3 10*3/uL (ref 0.1–1.0)
Monocytes Relative: 6 %
Neutro Abs: 2.5 10*3/uL (ref 1.7–7.7)
Neutrophils Relative %: 61 %
Platelets: 179 10*3/uL (ref 150–400)
RBC: 2.98 MIL/uL — ABNORMAL LOW (ref 3.87–5.11)
RDW: 20.2 % — ABNORMAL HIGH (ref 11.5–15.5)
WBC: 4.1 10*3/uL (ref 4.0–10.5)
nRBC: 2.2 % — ABNORMAL HIGH (ref 0.0–0.2)

## 2023-04-11 LAB — COMPREHENSIVE METABOLIC PANEL
ALT: 22 U/L (ref 0–44)
AST: 26 U/L (ref 15–41)
Albumin: 3.3 g/dL — ABNORMAL LOW (ref 3.5–5.0)
Alkaline Phosphatase: 98 U/L (ref 38–126)
Anion gap: 11 (ref 5–15)
BUN: 12 mg/dL (ref 6–20)
CO2: 21 mmol/L — ABNORMAL LOW (ref 22–32)
Calcium: 8.7 mg/dL — ABNORMAL LOW (ref 8.9–10.3)
Chloride: 105 mmol/L (ref 98–111)
Creatinine, Ser: 0.52 mg/dL (ref 0.44–1.00)
GFR, Estimated: >60 mL/min (ref >60)
Glucose, Bld: 136 mg/dL — ABNORMAL HIGH (ref 70–99)
Potassium: 3.7 mmol/L (ref 3.5–5.1)
Sodium: 137 mmol/L (ref 135–145)
Total Bilirubin: 0.4 mg/dL (ref 0.0–1.2)
Total Protein: 7.1 g/dL (ref 6.5–8.1)

## 2023-04-11 LAB — MAGNESIUM: Magnesium: 1.5 mg/dL — ABNORMAL LOW (ref 1.7–2.4)

## 2023-04-11 MED ORDER — PACLITAXEL PROTEIN-BOUND CHEMO INJECTION 100 MG
125.0000 mg/m2 | Freq: Once | INTRAVENOUS | Status: AC
  Administered 2023-04-11: 225 mg via INTRAVENOUS
  Filled 2023-04-11: qty 45

## 2023-04-11 MED ORDER — PALONOSETRON HCL INJECTION 0.25 MG/5ML
0.2500 mg | Freq: Once | INTRAVENOUS | Status: AC
  Administered 2023-04-11: 0.25 mg via INTRAVENOUS
  Filled 2023-04-11: qty 5

## 2023-04-11 MED ORDER — HEPARIN SOD (PORK) LOCK FLUSH 100 UNIT/ML IV SOLN
500.0000 [IU] | Freq: Once | INTRAVENOUS | Status: AC | PRN
  Administered 2023-04-11: 500 [IU]

## 2023-04-11 MED ORDER — SODIUM CHLORIDE 0.9 % IV SOLN
INTRAVENOUS | Status: DC
Start: 2023-04-11 — End: 2023-04-11

## 2023-04-11 MED ORDER — DEXAMETHASONE SODIUM PHOSPHATE 10 MG/ML IJ SOLN
10.0000 mg | Freq: Once | INTRAMUSCULAR | Status: AC
Start: 2023-04-11 — End: 2023-04-11
  Administered 2023-04-11: 10 mg via INTRAVENOUS
  Filled 2023-04-11: qty 1

## 2023-04-11 MED ORDER — MAGNESIUM SULFATE 2 GM/50ML IV SOLN
2.0000 g | Freq: Once | INTRAVENOUS | Status: AC
  Administered 2023-04-11: 2 g via INTRAVENOUS
  Filled 2023-04-11: qty 50

## 2023-04-11 MED ORDER — SODIUM CHLORIDE 0.9% FLUSH
10.0000 mL | INTRAVENOUS | Status: DC | PRN
  Administered 2023-04-11: 10 mL

## 2023-04-11 MED ORDER — ZOLEDRONIC ACID 4 MG/100ML IV SOLN
4.0000 mg | Freq: Once | INTRAVENOUS | Status: AC
  Administered 2023-04-11: 4 mg via INTRAVENOUS
  Filled 2023-04-11: qty 100

## 2023-04-11 MED ORDER — SODIUM CHLORIDE 0.9 % IV SOLN
235.0000 mg | Freq: Once | INTRAVENOUS | Status: AC
  Administered 2023-04-11: 240 mg via INTRAVENOUS
  Filled 2023-04-11: qty 24

## 2023-04-11 MED ORDER — SODIUM CHLORIDE 0.9 % IV SOLN: INTRAVENOUS | Status: DC

## 2023-04-11 NOTE — Progress Notes (Signed)
 Patient presents today for Abraxane/Carboplatin infusion per providers order.  Vital signs and labs within parameters for treatment.  Patient has no new complaints at this time.  Patient will receive 2 grams Magnesium Sulfate IV with treatment.  Treatment given today per MD orders.  Stable during infusion without adverse affects.  Vital signs stable.  No complaints at this time.  Discharge from clinic ambulatory in stable condition.  Alert and oriented X 3.  Follow up with [facility] as scheduled.

## 2023-04-11 NOTE — Patient Instructions (Signed)
 [facility] [place] - A DEPT OF [facility]  Discharge Instructions: Thank you for choosing [facility] to provide your oncology and hematology care.  If you have a lab appointment with the Cancer Center - please note that after April 8th, 2024, all labs will be drawn in the cancer center.  You do not have to check in or register with the main entrance as you have in the past but will complete your check-in in the cancer center.  Wear comfortable clothing and clothing appropriate for easy access to any Portacath or PICC line.   We strive to give you quality time with your provider. You may need to reschedule your appointment if you arrive late (15 or more minutes).  Arriving late affects you and other patients whose appointments are after yours.  Also, if you miss three or more appointments without notifying the office, you may be dismissed from the clinic at the provider's discretion.      For prescription refill requests, have your pharmacy contact our office and allow 72 hours for refills to be completed.    Today you received the following chemotherapy and/or immunotherapy agents Abraxane carboplatin magnesium sulfate      To help prevent nausea and vomiting after your treatment, we encourage you to take your nausea medication as directed.  BELOW ARE SYMPTOMS THAT SHOULD BE REPORTED IMMEDIATELY: *FEVER GREATER THAN 100.4 F (38 C) OR HIGHER *CHILLS OR SWEATING *NAUSEA AND VOMITING THAT IS NOT CONTROLLED WITH YOUR NAUSEA MEDICATION *UNUSUAL SHORTNESS OF BREATH *UNUSUAL BRUISING OR BLEEDING *URINARY PROBLEMS (pain or burning when urinating, or frequent urination) *BOWEL PROBLEMS (unusual diarrhea, constipation, pain near the anus) TENDERNESS IN MOUTH AND THROAT WITH OR WITHOUT PRESENCE OF ULCERS (sore throat, sores in mouth, or a toothache) UNUSUAL RASH, SWELLING OR PAIN  UNUSUAL VAGINAL DISCHARGE OR ITCHING   Items with * indicate a potential  emergency and should be followed up as soon as possible or go to the Emergency Department if any problems should occur.  Please show the CHEMOTHERAPY ALERT CARD or IMMUNOTHERAPY ALERT CARD at check-in to the Emergency Department and triage nurse.  Should you have questions after your visit or need to cancel or reschedule your appointment, please contact [facility] [place] - A DEPT OF [facility] [phone]  and follow the prompts.  Office hours are 8:00 a.m. to 4:30 p.m. Monday - Friday. Please note that voicemails left after 4:00 p.m. may not be returned until the following business day.  We are closed weekends and major holidays. You have access to a nurse at all times for urgent questions. Please call the main number to the clinic [phone] and follow the prompts.  For any non-urgent questions, you may also contact your provider using MyChart. We now offer e-Visits for anyone 69 and older to request care online for non-urgent symptoms. For details visit [email or web address].   Also download the MyChart app! Go to the app store, search "MyChart", open the app, select [facility], and log in with your MyChart username and password.

## 2023-04-24 NOTE — Progress Notes (Signed)
 [facility] [address] [place] [address]    Clinic Day:  04/25/2023  Referring physician: Assunta Found, MD  Patient Care Team: Assunta Found, MD as PCP - General (Family Medicine) Jena Gauss, Gerrit Friends, MD (Gastroenterology) Antony Blackbird, MD as Consulting Physician (Radiation Oncology) Manus Rudd, MD as Consulting Physician (General Surgery) Pershing Proud, RN as Oncology Nurse Navigator Donnelly Angelica, RN as Oncology Nurse Navigator Doreatha Massed, MD as Medical Oncologist (Medical Oncology) Therese Sarah, RN as Oncology Nurse Navigator (Medical Oncology)   ASSESSMENT & PLAN:   Assessment: 1.  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  Noticed skin changes in the right breast 3 months ago. - Mammogram (06/19/2022): Large 8-9 cm mass in the right breast with 2 abnormal axillary lymph nodes and skin thickening. 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  1.8 cm ill-defined enhancing mass in the lateral left breast suspicious for additional malignancy.  Right breast mass involves right chest wall including pectoralis and intercostal muscles.  Prominent subcentimeter left axillary lymph nodes. - PET scan (07/12/2022): Intensely hypermetabolic right breast mass with right axillary lymph nodes.  Hypermetabolic metastatic lymph node in the posterior triangle of the right neck.  Hypermetabolic left axillary lymph nodes.  No evidence of metastatic adenopathy in the mediastinum/abdomen/pelvis.  No evidence of visceral metastasis.  Multiple skeletal metastasis:  Left sacral ala 2.5 cm, left iliac bone, L4 and T12 vertebral bodies, multiple rib lesions, left scapular lesion. - She was evaluated by Dr. Al Pimple and was recommended treatment with ribociclib and anastrozole. - Germline mutation testing: Negative. - Anastrozole and ribociclib started on 07/25/2022, discontinued on 12/13/2022 due to progression. - PET scan (11/15/2022): Mixed response to therapy with overall progression of disease with new bone metastasis and liver metastasis.  New periportal lymph nodes. - Liver biopsy (12/07/2022): Metastatic poorly differentiated adenocarcinoma consistent with breast primary.  ER +2% with moderate staining, PR 0%, HER2 (1+). - Guardant360: APC G1312, T p53, MYC amplification, FGF R2 amplification, EGFR amplification - PD-L1 (22 C3): 0% - Abraxane day 1, day 8 every 21 days started on 12/21/2022, carboplatin AUC on day 1 and day 8 added with cycle 2 on 01/10/2023 - NGS (12/07/2022): MS-stable, TMB-low, PTEN pathogenic variant, genomic LOH high (18%), HER2 (1+), PD-L1 (22 C3) CPS-10.    2.  Social/family history: - She lives at home with her husband.  Used OCP for approximately 16-18 years.  Underwent hysterectomy after endometrial ablation when she was found to have fibroids causing pain.  No family history of breast or other malignancies.  Smokes 1 pack of cigarettes per day.    Plan: 1.  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 - She received 2 cycles of carboplatin, Abraxane and Keytruda on 03/14/2023 and 04/04/2023. - Denies any immunotherapy related side effects.  Reports the breast discharge has gotten better. - Labs today: Normal LFTs with mildly elevated AST of 45.  Creatinine  normal.  White count is slightly low at 3.8 with normal ANC.  Her tumor markers  CA 15-3 and CA 27-29 have improved. - Proceed with cycle 3 chemoimmunotherapy today.  RTC 3 weeks for follow-up.  Will plan on repeating PET scan after cycle 4.    2.  Bone metastasis: - Last Zometa on 04/11/2023.  Denies any jaw pains or dental issues.  Continue calcium supplements.  Continue Zometa every 12 weeks.  3.  Hypomagnesemia: - Continue magnesium twice daily.  Magnesium is normal today.  4.  Leg swelling: - She reported leg swellings started on Friday.  Albumin is normal at 3.5.  Will start her on Lasix 20 mg in the mornings daily as needed.     No orders of the defined types were placed in this encounter.     Monica Soto,acting as a Neurosurgeon for Doreatha Massed, MD.,have documented all relevant documentation on the behalf of Doreatha Massed, MD,as directed by  Doreatha Massed, MD while in the presence of Doreatha Massed, MD.  I, Doreatha Massed MD, have reviewed the above documentation for accuracy and completeness, and I agree with the above.      Doreatha Massed, MD   3/20/202511:13 AM  CHIEF COMPLAINT:   Diagnosis: metastatic inflammatory right breast cancer    Cancer Staging  Malignant neoplasm of upper-outer quadrant of right breast in female, estrogen receptor positive (HCC) Staging form: Breast, AJCC 8th Edition - Clinical: Stage IV (cT4, cN2, cM1, G2, ER+, PR+, HER2-) - Signed by Rachel Moulds, MD on 07/17/2022    Prior Therapy: none  Current Therapy:  Anastrozole and ribociclib    HISTORY OF PRESENT ILLNESS:   Oncology History  Malignant neoplasm of upper-outer quadrant of right breast in female, estrogen receptor positive (HCC)  06/19/2022 Mammogram   Mammogram showed large mass in the right breast which appears to measure 8 to 9 cm on ultrasound, 2 abnormal right axillary lymph node and a third borderline lymph node.  Skin thickening noted along the medial aspect of left breast which may be an extension of the marked  skin thickening diffusely on the right.  Otherwise no left breast abnormalities are identified.   06/22/2022 Pathology Results   Needle core biopsy from the right breast upper outer quadrant showed grade 2 IDC, both lymph nodes with metastatic adenocarcinoma.  Prognostic showed ER 90% positive strong staining PR 70% positive strong staining Ki-67 of 20% HER2 negative by IHC 1+   07/03/2022 Initial Diagnosis   Malignant neoplasm of upper-outer quadrant of right breast in female, estrogen receptor positive (HCC)   07/04/2022 Cancer Staging   Staging form: Breast, AJCC 8th Edition - Clinical: Stage IV (cT4, cN2, cM1, G2, ER+, PR+, HER2-) - Signed by Rachel Moulds, MD on 07/17/2022 Stage prefix: Initial diagnosis Histologic grading system: 3 grade system   07/16/2022 - 07/16/2022 Chemotherapy   Patient is on Treatment Plan : BREAST ADJUVANT DOSE DENSE AC q14d / PACLitaxel q7d      Genetic Testing   [facility] Custom Panel+RNA was Negative. Report date is 07/10/2022.  The Custom Hereditary Cancers Panel offered by [facility] includes sequencing and/or deletion duplication testing of the following 43 genes: APC, ATM, AXIN2, BAP1, BARD1, BMPR1A, BRCA1, BRCA2, BRIP1, CDH1, CDK4, CDKN2A (p14ARF and p16INK4a only), CHEK2, CTNNA1, EPCAM (Deletion/duplication testing only), FH, GREM1 (promoter region duplication testing only), HOXB13, KIT, MBD4, MEN1, MLH1, MSH2, MSH3, MSH6, MUTYH, NF1, NHTL1, PALB2, PDGFRA, PMS2, POLD1, POLE, PTEN, RAD51C, RAD51D, SMAD4, SMARCA4. STK11, TP53, TSC1, TSC2,  and VHL.   12/13/2022 - 12/13/2022 Chemotherapy   Patient is on Treatment Plan : BREAST METASTATIC Fam-Trastuzumab Deruxtecan-nxki (Enhertu) (5.4) q21d     12/21/2022 -  Chemotherapy   Patient is on Treatment Plan : BREAST Paclitaxel-Albumin (Abraxane) (100) D1,8 + Carboplatin AUC2 D1,8 every 3 weeks, q21d        INTERVAL HISTORY:   Jacalynn is a 58 y.o. female presenting to clinic today for follow up of metastatic inflammatory  right breast cancer. She was last seen by me on 04/04/23.  Today, she states that she is doing well overall. Her appetite level is at 100%. Her energy level is at 50%.   Wyolene reports itching on the chest occurring the second day after treatment, that resolves with Benadryl. She also notes dry skin, though she is not sure if this is attributable to treatment. She denies any other side effects from [place].   Her right breast discharge has improved though not resolved. She has numbness and tingling in the hands and feet. Jaymie notes swelling in the bilateral feet and hands over the past weekend that has mostly improved in the feet by today's visit. She has used an arm sleeve by the lymphedema clinic, which mildly improves upper extremity swelling.  Idolina reports an healthy appetite. She has been eating protein bars and drinking protein shakes. She is taking Magnesium supplements as prescribed. While last receiving IV magnesium, Arkie states she had an upset stomach.   PAST MEDICAL HISTORY:   Past Medical History: Past Medical History:  Diagnosis Date   Depression    Diabetes mellitus without complication (HCC)    Erosive esophagitis 04/26/2010   EGD by Dr. Jena Gauss, small hiatal hernia   GERD (gastroesophageal reflux disease)    History of stomach ulcers    IBS (irritable bowel syndrome)    Diarrhea predominant   Microscopic colitis 04/26/2010   Colonoscopy by Dr. Jena Gauss, good response with Entocort   Tubular adenoma of colon 04/26/2010   Junction of descending and sigmoid 40 CM from anus    Surgical History: Past Surgical History:  Procedure Laterality Date   ABDOMINAL HYSTERECTOMY     APPENDECTOMY  2/11   Dr. Malvin Johns with a delayed closure   BREAST BIOPSY Right 06/22/2022   [place] RT BREAST BX W LOC DEV 1ST LESION IMG BX SPEC US GUIDE 06/22/2022 [facility] MAMMOGRAPHY   PORTACATH PLACEMENT N/A 07/16/2022   Procedure: INSERTION PORT-A-CATH;  Surgeon: Manus Rudd, MD;  Location: [facility];  Service: General;  Laterality: N/A;   TONSILLECTOMY     TUBAL LIGATION      Social History: Social History   Socioeconomic History   Marital status: Married    Spouse name: Not on file   Number of children: Not on file   Years of education: Not on file   Highest education level: Not on file  Occupational History    Employer: [employer]    Comment: Third shift  Tobacco Use   Smoking status: Every Day    Current packs/day: 0.50    Types: Cigarettes   Smokeless tobacco: Never  Vaping Use   Vaping status: Never Used  Substance and Sexual Activity   Alcohol use: Yes    Alcohol/week: 1.0 standard drink of alcohol    Types: 1 Glasses of wine per week    Comment: seldom   Drug use: No   Sexual activity: Yes    Birth control/protection: Surgical  Other Topics Concern  Not on file  Social History Narrative   Not on file   Social Drivers of Health   Financial Resource Strain: Not on file  Food Insecurity: No Food Insecurity (07/04/2022)   Hunger Vital Sign    Worried About Running Out of Food in the Last Year: Never true    Ran Out of Food in the Last Year: Never true  Transportation Needs: No Transportation Needs (07/04/2022)   PRAPARE - Administrator, Civil Service (Medical): No    Lack of Transportation (Non-Medical): No  Physical Activity: Not on file  Stress: Not on file  Social Connections: Not on file  Intimate Partner Violence: Not on file    Family History: Family History  Problem Relation Age of Onset   Diabetes Mother    Coronary artery disease Mother    Healthy Father     Current Medications:  Current Outpatient Medications:    Aspirin-Acetaminophen-Caffeine (GOODY HEADACHE PO), Take 1 packet by mouth 2 (two) times daily as needed (for pain)., Disp: , Rfl:    Cholecalciferol 1.25 MG (50000 UT) capsule, Take 1 capsule by mouth once a week., Disp: , Rfl:    fluticasone (FLONASE) 50 MCG/ACT nasal spray, Place 1 spray  into both nostrils daily., Disp: 16 g, Rfl: 0   furosemide (LASIX) 20 MG tablet, Take 1 tablet (20 mg total) by mouth daily as needed for edema., Disp: 30 tablet, Rfl: 2   HYDROcodone-acetaminophen (NORCO/VICODIN) 5-325 MG tablet, Take 1 tablet by mouth every 6 (six) hours as needed for moderate pain., Disp: 15 tablet, Rfl: 0   lidocaine-prilocaine (EMLA) cream, Apply to affected area once, Disp: 30 g, Rfl: 3   magnesium oxide (MAG-OX) 400 (240 Mg) MG tablet, Take 1 tablet (400 mg total) by mouth 2 (two) times daily., Disp: 60 tablet, Rfl: 3   ondansetron (ZOFRAN) 8 MG tablet, TAKE 1 TABLET(8 MG) BY MOUTH EVERY 8 HOURS AS NEEDED FOR NAUSEA OR VOMITING. START THIRD DAY AFTER DOXORUBICIN/ CYCLOPHOSPHAMIDE CHEMOTHERAPY, Disp: 30 tablet, Rfl: 1   prochlorperazine (COMPAZINE) 10 MG tablet, TAKE 1 TABLET(10 MG) BY MOUTH EVERY 6 HOURS AS NEEDED FOR NAUSEA OR VOMITING, Disp: 30 tablet, Rfl: 1 No current facility-administered medications for this visit.  Facility-Administered Medications Ordered in Other Visits:    0.9 %  sodium chloride infusion, , Intravenous, Continuous, Doreatha Massed, MD, Stopped at 03/14/23 1450   0.9 %  sodium chloride infusion, , Intravenous, Continuous, Doreatha Massed, MD, Last Rate: 10 mL/hr at 04/25/23 1027, New Bag at 04/25/23 1027   CARBOplatin (PARAPLATIN) 240 mg in sodium chloride 0.9 % 100 mL chemo infusion, 240 mg, Intravenous, Once, Doreatha Massed, MD   heparin lock flush 100 unit/mL, 500 Units, Intracatheter, Once PRN, Doreatha Massed, MD   PACLitaxel-protein bound (ABRAXANE) chemo infusion 225 mg, 125 mg/m2 (Treatment Plan Recorded), Intravenous, Once, Doreatha Massed, MD   pembrolizumab [facility]) 200 mg in sodium chloride 0.9 % 50 mL chemo infusion, 200 mg, Intravenous, Once, Doreatha Massed, MD   sodium chloride flush (NS) 0.9 % injection 10 mL, 10 mL, Intracatheter, PRN, Doreatha Massed, MD, 10 mL at 03/14/23 1449   sodium  chloride flush (NS) 0.9 % injection 10 mL, 10 mL, Intravenous, PRN, Doreatha Massed, MD, 10 mL at 03/21/23 1237   sodium chloride flush (NS) 0.9 % injection 10 mL, 10 mL, Intracatheter, PRN, Doreatha Massed, MD   Allergies: No Known Allergies  REVIEW OF SYSTEMS:   Review of Systems  Constitutional:  Negative for chills, fatigue  and fever.  HENT:   Negative for lump/mass, mouth sores, nosebleeds, sore throat and trouble swallowing.   Eyes:  Negative for eye problems.  Respiratory:  Negative for cough and shortness of breath.   Cardiovascular:  Positive for leg swelling. Negative for chest pain and palpitations.  Gastrointestinal:  Negative for abdominal pain, constipation, diarrhea, nausea and vomiting.  Genitourinary:  Negative for bladder incontinence, difficulty urinating, dysuria, frequency, hematuria and nocturia.   Musculoskeletal:  Negative for arthralgias, back pain, flank pain, myalgias and neck pain.       +aching in bilateral legs, 4/10 severity  Skin:  Positive for itching. Negative for rash.  Neurological:  Positive for headaches and numbness (in feet). Negative for dizziness.  Hematological:  Does not bruise/bleed easily.  Psychiatric/Behavioral:  Negative for depression, sleep disturbance and suicidal ideas. The patient is not nervous/anxious.   All other systems reviewed and are negative.    VITALS:   Blood pressure 131/84, pulse 95, temperature 98 F (36.7 C), temperature source Oral, resp. rate 16, weight 166 lb 3.6 oz (75.4 kg), SpO2 99%.  Wt Readings from Last 3 Encounters:  04/25/23 166 lb 3.6 oz (75.4 kg)  04/11/23 165 lb 5.5 oz (75 kg)  04/04/23 165 lb 9.6 oz (75.1 kg)    Body mass index is 27.66 kg/m.  Performance status (ECOG): 1 - Symptomatic but completely ambulatory   PHYSICAL EXAM:   Physical Exam Vitals and nursing note reviewed. Exam conducted with a chaperone present.  Constitutional:      Appearance: Normal appearance.   Cardiovascular:     Rate and Rhythm: Normal rate and regular rhythm.     Pulses: Normal pulses.     Heart sounds: Normal heart sounds.  Pulmonary:     Effort: Pulmonary effort is normal.     Breath sounds: Normal breath sounds.  Abdominal:     Palpations: Abdomen is soft. There is no hepatomegaly, splenomegaly or mass.     Tenderness: There is no abdominal tenderness.  Musculoskeletal:     Right lower leg: No edema.     Left lower leg: No edema.  Lymphadenopathy:     Cervical: No cervical adenopathy.     Right cervical: No superficial, deep or posterior cervical adenopathy.    Left cervical: No superficial, deep or posterior cervical adenopathy.     Upper Body:     Right upper body: No supraclavicular or axillary adenopathy.     Left upper body: No supraclavicular or axillary adenopathy.  Neurological:     General: No focal deficit present.     Mental Status: She is alert and oriented to person, place, and time.  Psychiatric:        Mood and Affect: Mood normal.        Behavior: Behavior normal.     LABS:      Latest Ref Rng & Units 04/25/2023    9:18 AM 04/11/2023    7:49 AM 04/04/2023    8:56 AM  CBC  WBC 4.0 - 10.5 K/uL 3.8  4.1  3.7   Hemoglobin 12.0 - 15.0 g/dL 81.1  9.5  9.6   Hematocrit 36.0 - 46.0 % 31.5  29.2  30.1   Platelets 150 - 400 K/uL 167  179  219       Latest Ref Rng & Units 04/25/2023    9:18 AM 04/11/2023    7:49 AM 04/04/2023    8:56 AM  CMP  Glucose 70 - 99 mg/dL 914  136  120   BUN 6 - 20 mg/dL 8  12  9    Creatinine 0.44 - 1.00 mg/dL 1.61  0.96  0.45   Sodium 135 - 145 mmol/L 137  137  136   Potassium 3.5 - 5.1 mmol/L 3.9  3.7  3.4   Chloride 98 - 111 mmol/L 105  105  104   CO2 22 - 32 mmol/L 20  21  21    Calcium 8.9 - 10.3 mg/dL 8.6  8.7  8.4   Total Protein 6.5 - 8.1 g/dL 7.5  7.1  6.9   Total Bilirubin 0.0 - 1.2 mg/dL 0.6  0.4  0.5   Alkaline Phos 38 - 126 U/L 95  98  104   AST 15 - 41 U/L 45  26  23   ALT 0 - 44 U/L 26  22  14        No results found for: "CEA1", "CEA" / No results found for: "CEA1", "CEA" No results found for: "PSA1" No results found for: "WUJ811" No results found for: "CAN125"  No results found for: "TOTALPROTELP", "ALBUMINELP", "A1GS", "A2GS", "BETS", "BETA2SER", "GAMS", "MSPIKE", "SPEI" No results found for: "TIBC", "FERRITIN", "IRONPCTSAT" No results found for: "LDH"   STUDIES:   No results found.

## 2023-04-25 ENCOUNTER — Inpatient Hospital Stay: Payer: 59

## 2023-04-25 ENCOUNTER — Inpatient Hospital Stay (HOSPITAL_BASED_OUTPATIENT_CLINIC_OR_DEPARTMENT_OTHER): Payer: 59 | Admitting: Hematology

## 2023-04-25 VITALS — BP 131/84 | HR 95 | Temp 98.0°F | Resp 16 | Wt 166.2 lb

## 2023-04-25 VITALS — BP 152/85 | HR 85 | Temp 98.5°F | Resp 16

## 2023-04-25 DIAGNOSIS — Z5112 Encounter for antineoplastic immunotherapy: Secondary | ICD-10-CM | POA: Diagnosis not present

## 2023-04-25 DIAGNOSIS — C50411 Malignant neoplasm of upper-outer quadrant of right female breast: Secondary | ICD-10-CM

## 2023-04-25 DIAGNOSIS — Z170 Estrogen receptor positive status [ER+]: Secondary | ICD-10-CM

## 2023-04-25 DIAGNOSIS — Z95828 Presence of other vascular implants and grafts: Secondary | ICD-10-CM

## 2023-04-25 LAB — CBC WITH DIFFERENTIAL/PLATELET
Abs Immature Granulocytes: 0.03 10*3/uL (ref 0.00–0.07)
Basophils Absolute: 0 10*3/uL (ref 0.0–0.1)
Basophils Relative: 1 %
Eosinophils Absolute: 0 10*3/uL (ref 0.0–0.5)
Eosinophils Relative: 1 %
HCT: 31.5 % — ABNORMAL LOW (ref 36.0–46.0)
Hemoglobin: 10.2 g/dL — ABNORMAL LOW (ref 12.0–15.0)
Immature Granulocytes: 1 %
Lymphocytes Relative: 26 %
Lymphs Abs: 1 10*3/uL (ref 0.7–4.0)
MCH: 32.3 pg (ref 26.0–34.0)
MCHC: 32.4 g/dL (ref 30.0–36.0)
MCV: 99.7 fL (ref 80.0–100.0)
Monocytes Absolute: 0.5 10*3/uL (ref 0.1–1.0)
Monocytes Relative: 12 %
Neutro Abs: 2.3 10*3/uL (ref 1.7–7.7)
Neutrophils Relative %: 59 %
Platelets: 167 10*3/uL (ref 150–400)
RBC: 3.16 MIL/uL — ABNORMAL LOW (ref 3.87–5.11)
RDW: 20.4 % — ABNORMAL HIGH (ref 11.5–15.5)
WBC: 3.8 10*3/uL — ABNORMAL LOW (ref 4.0–10.5)
nRBC: 0 % (ref 0.0–0.2)

## 2023-04-25 LAB — COMPREHENSIVE METABOLIC PANEL WITH GFR
ALT: 26 U/L (ref 0–44)
AST: 45 U/L — ABNORMAL HIGH (ref 15–41)
Albumin: 3.5 g/dL (ref 3.5–5.0)
Alkaline Phosphatase: 95 U/L (ref 38–126)
Anion gap: 12 (ref 5–15)
BUN: 8 mg/dL (ref 6–20)
CO2: 20 mmol/L — ABNORMAL LOW (ref 22–32)
Calcium: 8.6 mg/dL — ABNORMAL LOW (ref 8.9–10.3)
Chloride: 105 mmol/L (ref 98–111)
Creatinine, Ser: 0.64 mg/dL (ref 0.44–1.00)
GFR, Estimated: >60 mL/min (ref >60)
Glucose, Bld: 120 mg/dL — ABNORMAL HIGH (ref 70–99)
Potassium: 3.9 mmol/L (ref 3.5–5.1)
Sodium: 137 mmol/L (ref 135–145)
Total Bilirubin: 0.6 mg/dL (ref 0.0–1.2)
Total Protein: 7.5 g/dL (ref 6.5–8.1)

## 2023-04-25 LAB — MAGNESIUM: Magnesium: 1.9 mg/dL (ref 1.7–2.4)

## 2023-04-25 MED ORDER — SODIUM CHLORIDE FLUSH 0.9 % IV SOLN
10.0000 mL | Freq: Once | INTRAVENOUS | Status: AC
Start: 2023-04-25 — End: 2023-04-25
  Administered 2023-04-25: 10 mL via INTRAVENOUS
  Filled 2023-04-25: qty 10

## 2023-04-25 MED ORDER — SODIUM CHLORIDE 0.9 % IV SOLN
235.0000 mg | Freq: Once | INTRAVENOUS | Status: AC
  Administered 2023-04-25: 240 mg via INTRAVENOUS
  Filled 2023-04-25: qty 24

## 2023-04-25 MED ORDER — PALONOSETRON HCL INJECTION 0.25 MG/5ML
0.2500 mg | Freq: Once | INTRAVENOUS | Status: AC
  Administered 2023-04-25: 0.25 mg via INTRAVENOUS
  Filled 2023-04-25: qty 5

## 2023-04-25 MED ORDER — HEPARIN SOD (PORK) LOCK FLUSH 100 UNIT/ML IV SOLN
500.0000 [IU] | Freq: Once | INTRAVENOUS | Status: AC | PRN
  Administered 2023-04-25: 500 [IU]

## 2023-04-25 MED ORDER — SODIUM CHLORIDE 0.9 % IV SOLN
200.0000 mg | Freq: Once | INTRAVENOUS | Status: AC
  Administered 2023-04-25: 200 mg via INTRAVENOUS
  Filled 2023-04-25: qty 8

## 2023-04-25 MED ORDER — FUROSEMIDE 20 MG PO TABS: 20.0000 mg | ORAL_TABLET | Freq: Every day | ORAL | 2 refills | Status: DC | PRN

## 2023-04-25 MED ORDER — SODIUM CHLORIDE 0.9 % IV SOLN: INTRAVENOUS | Status: DC

## 2023-04-25 MED ORDER — DEXAMETHASONE SODIUM PHOSPHATE 10 MG/ML IJ SOLN
10.0000 mg | Freq: Once | INTRAMUSCULAR | Status: AC
Start: 2023-04-25 — End: 2023-04-25
  Administered 2023-04-25: 10 mg via INTRAVENOUS
  Filled 2023-04-25: qty 1

## 2023-04-25 MED ORDER — PACLITAXEL PROTEIN-BOUND CHEMO INJECTION 100 MG
125.0000 mg/m2 | Freq: Once | INTRAVENOUS | Status: AC
  Administered 2023-04-25: 225 mg via INTRAVENOUS
  Filled 2023-04-25: qty 45

## 2023-04-25 MED ORDER — SODIUM CHLORIDE 0.9% FLUSH
10.0000 mL | INTRAVENOUS | Status: DC | PRN
  Administered 2023-04-25: 10 mL

## 2023-04-25 NOTE — Progress Notes (Signed)
 Patient has been examined by Dr. Ellin Saba. Vital signs and labs have been reviewed by MD - ANC, Creatinine, LFTs, hemoglobin, and platelets are within treatment parameters per M.D. - pt may proceed with treatment.  Primary RN and pharmacy notified.

## 2023-04-25 NOTE — Progress Notes (Signed)
Patient presents today for chemotherapy infusion. Patient is in satisfactory condition with no new complaints voiced.  Vital signs are stable.  Labs reviewed by Dr. Katragadda during the office visit and all labs are within treatment parameters.  We will proceed with treatment per MD orders.   Patient tolerated treatment well with no complaints voiced.  Patient left ambulatory in stable condition.  Vital signs stable at discharge.  Follow up as scheduled.       

## 2023-04-25 NOTE — Patient Instructions (Signed)

## 2023-04-25 NOTE — Patient Instructions (Signed)
 [facility] [place] [facility]  Discharge Instructions: Thank you for choosing [facility] to provide your oncology and hematology care.  If you have a lab appointment with the Cancer Center - please note that after April 8th, 2024, all labs will be drawn in the cancer center.  You do not have to check in or register with the main entrance as you have in the past but will complete your check-in in the cancer center.  Wear comfortable clothing and clothing appropriate for easy access to any Portacath or PICC line.   We strive to give you quality time with your provider. You may need to reschedule your appointment if you arrive late (15 or more minutes).  Arriving late affects you and other patients whose appointments are after yours.  Also, if you miss three or more appointments without notifying the office, you may be dismissed from the clinic at the provider's discretion.      For prescription refill requests, have your pharmacy contact our office and allow 72 hours for refills to be completed.    Today you received the following chemotherapy and/or immunotherapy agents Keytruda/Abraxane/Carboplatin.  Pembrolizumab Injection What is this medication? PEMBROLIZUMAB (PEM broe LIZ ue mab) treats some types of cancer. It works by helping your immune system slow or stop the spread of cancer cells. It is a monoclonal antibody. This medicine may be used for other purposes; ask your health care provider or pharmacist if you have questions. COMMON BRAND NAME(S): Keytruda What should I tell my care team before I take this medication? They need to know if you have any of these conditions: Allogeneic stem cell transplant (uses someone else's stem cells) Autoimmune diseases, such as Crohn disease, ulcerative colitis, lupus History of chest radiation Nervous system problems, such as Guillain-Barre syndrome, myasthenia gravis Organ transplant An unusual or  allergic reaction to pembrolizumab, other medications, foods, dyes, or preservatives Pregnant or trying to get pregnant Breast-feeding How should I use this medication? This medication is injected into a vein. It is given by your care team in a hospital or clinic setting. A special MedGuide will be given to you before each treatment. Be sure to read this information carefully each time. Talk to your care team about the use of this medication in children. While it may be prescribed for children as young as 6 months for selected conditions, precautions do apply. Overdosage: If you think you have taken too much of this medicine contact a poison control center or emergency room at once. NOTE: This medicine is only for you. Do not share this medicine with others. What if I miss a dose? Keep appointments for follow-up doses. It is important not to miss your dose. Call your care team if you are unable to keep an appointment. What may interact with this medication? Interactions have not been studied. This list may not describe all possible interactions. Give your health care provider a list of all the medicines, herbs, non-prescription drugs, or dietary supplements you use. Also tell them if you smoke, drink alcohol, or use illegal drugs. Some items may interact with your medicine. What should I watch for while using this medication? Your condition will be monitored carefully while you are receiving this medication. You may need blood work while taking this medication. This medication may cause serious skin reactions. They can happen weeks to months after starting the medication. Contact your care team right away if you  notice fevers or flu-like symptoms with a rash. The rash may be red or purple and then turn into blisters or peeling of the skin. You may also notice a red rash with swelling of the face, lips, or lymph nodes in your neck or under your arms. Tell your care team right away if you have any  change in your eyesight. Talk to your care team if you may be pregnant. Serious birth defects can occur if you take this medication during pregnancy and for 4 months after the last dose. You will need a negative pregnancy test before starting this medication. Contraception is recommended while taking this medication and for 4 months after the last dose. Your care team can help you find the option that works for you. Do not breastfeed while taking this medication and for 4 months after the last dose. What side effects may I notice from receiving this medication? Side effects that you should report to your care team as soon as possible: Allergic reactions--skin rash, itching, hives, swelling of the face, lips, tongue, or throat Dry cough, shortness of breath or trouble breathing Eye pain, redness, irritation, or discharge with blurry or decreased vision Heart muscle inflammation--unusual weakness or fatigue, shortness of breath, chest pain, fast or irregular heartbeat, dizziness, swelling of the ankles, feet, or hands Hormone gland problems--headache, sensitivity to light, unusual weakness or fatigue, dizziness, fast or irregular heartbeat, increased sensitivity to cold or heat, excessive sweating, constipation, hair loss, increased thirst or amount of urine, tremors or shaking, irritability Infusion reactions--chest pain, shortness of breath or trouble breathing, feeling faint or lightheaded Kidney injury (glomerulonephritis)--decrease in the amount of urine, red or dark Wrigley Plasencia urine, foamy or bubbly urine, swelling of the ankles, hands, or feet Liver injury--right upper belly pain, loss of appetite, nausea, light-colored stool, dark yellow or Fredrick Dray urine, yellowing skin or eyes, unusual weakness or fatigue Pain, tingling, or numbness in the hands or feet, muscle weakness, change in vision, confusion or trouble speaking, loss of balance or coordination, trouble walking, seizures Rash, fever, and swollen  lymph nodes Redness, blistering, peeling, or loosening of the skin, including inside the mouth Sudden or severe stomach pain, bloody diarrhea, fever, nausea, vomiting Side effects that usually do not require medical attention (report to your care team if they continue or are bothersome): Bone, joint, or muscle pain Diarrhea Fatigue Loss of appetite Nausea Skin rash This list may not describe all possible side effects. Call your doctor for medical advice about side effects. You may report side effects to FDA at 1-800-FDA-1088. Where should I keep my medication? This medication is given in a hospital or clinic. It will not be stored at home. NOTE: This sheet is a summary. It may not cover all possible information. If you have questions about this medicine, talk to your doctor, pharmacist, or health care provider.  2024 Elsevier/Gold Standard (2021-06-06 00:00:00)   Paclitaxel Nanoparticle Albumin-Bound Injection What is this medication? NANOPARTICLE ALBUMIN-BOUND PACLITAXEL (Na no PAHR ti kuhl al BYOO muhn-bound PAK li TAX el) treats some types of cancer. It works by slowing down the growth of cancer cells. This medicine may be used for other purposes; ask your health care provider or pharmacist if you have questions. COMMON BRAND NAME(S): Abraxane What should I tell my care team before I take this medication? They need to know if you have any of these conditions: Liver disease Low white blood cell levels An unusual or allergic reaction to paclitaxel, albumin, other medications, foods, dyes,  or preservatives If you or your partner are pregnant or trying to get pregnant Breast-feeding How should I use this medication? This medication is injected into a vein. It is given by your care team in a hospital or clinic setting. Talk to your care team about the use of this medication in children. Special care may be needed. Overdosage: If you think you have taken too much of this medicine  contact a poison control center or emergency room at once. NOTE: This medicine is only for you. Do not share this medicine with others. What if I miss a dose? Keep appointments for follow-up doses. It is important not to miss your dose. Call your care team if you are unable to keep an appointment. What may interact with this medication? Other medications may affect the way this medication works. Talk with your care team about all of the medications you take. They may suggest changes to your treatment plan to lower the risk of side effects and to make sure your medications work as intended. This list may not describe all possible interactions. Give your health care provider a list of all the medicines, herbs, non-prescription drugs, or dietary supplements you use. Also tell them if you smoke, drink alcohol, or use illegal drugs. Some items may interact with your medicine. What should I watch for while using this medication? Your condition will be monitored carefully while you are receiving this medication. You may need blood work while taking this medication. This medication may make you feel generally unwell. This is not uncommon as chemotherapy can affect healthy cells as well as cancer cells. Report any side effects. Continue your course of treatment even though you feel ill unless your care team tells you to stop. This medication can cause serious allergic reactions. To reduce the risk, your care team may give you other medications to take before receiving this one. Be sure to follow the directions from your care team. This medication may increase your risk of getting an infection. Call your care team for advice if you get a fever, chills, sore throat, or other symptoms of a cold or flu. Do not treat yourself. Try to avoid being around people who are sick. This medication may increase your risk to bruise or bleed. Call your care team if you notice any unusual bleeding. Be careful brushing or flossing  your teeth or using a toothpick because you may get an infection or bleed more easily. If you have any dental work done, tell your dentist you are receiving this medication. Talk to your care team if you or your partner may be pregnant. Serious birth defects can occur if you take this medication during pregnancy and for 6 months after the last dose. You will need a negative pregnancy test before starting this medication. Contraception is recommended while taking this medication and for 6 months after the last dose. Your care team can help you find the option that works for you. If your partner can get pregnant, use a condom during sex while taking this medication and for 3 months after the last dose. Do not breastfeed while taking this medication and for 2 weeks after the last dose. This medication may cause infertility. Talk to your care team if you are concerned about your fertility. What side effects may I notice from receiving this medication? Side effects that you should report to your care team as soon as possible: Allergic reactions--skin rash, itching, hives, swelling of the face, lips, tongue, or throat  Dry cough, shortness of breath or trouble breathing Infection--fever, chills, cough, sore throat, wounds that don't heal, pain or trouble when passing urine, general feeling of discomfort or being unwell Low red blood cell level--unusual weakness or fatigue, dizziness, headache, trouble breathing Pain, tingling, or numbness in the hands or feet Stomach pain, unusual weakness or fatigue, nausea, vomiting, diarrhea, or fever that lasts longer than expected Unusual bruising or bleeding Side effects that usually do not require medical attention (report to your care team if they continue or are bothersome): Diarrhea Fatigue Hair loss Loss of appetite Nausea Vomiting This list may not describe all possible side effects. Call your doctor for medical advice about side effects. You may report  side effects to FDA at 1-800-FDA-1088. Where should I keep my medication? This medication is given in a hospital or clinic. It will not be stored at home. NOTE: This sheet is a summary. It may not cover all possible information. If you have questions about this medicine, talk to your doctor, pharmacist, or health care provider.  2024 Elsevier/Gold Standard (2021-06-08 00:00:00)   Carboplatin Injection What is this medication? CARBOPLATIN (KAR boe pla tin) treats some types of cancer. It works by slowing down the growth of cancer cells. This medicine may be used for other purposes; ask your health care provider or pharmacist if you have questions. COMMON BRAND NAME(S): Paraplatin What should I tell my care team before I take this medication? They need to know if you have any of these conditions: Blood disorders Hearing problems Kidney disease Recent or ongoing radiation therapy An unusual or allergic reaction to carboplatin, cisplatin, other medications, foods, dyes, or preservatives Pregnant or trying to get pregnant Breast-feeding How should I use this medication? This medication is injected into a vein. It is given by your care team in a hospital or clinic setting. Talk to your care team about the use of this medication in children. Special care may be needed. Overdosage: If you think you have taken too much of this medicine contact a poison control center or emergency room at once. NOTE: This medicine is only for you. Do not share this medicine with others. What if I miss a dose? Keep appointments for follow-up doses. It is important not to miss your dose. Call your care team if you are unable to keep an appointment. What may interact with this medication? Medications for seizures Some antibiotics, such as amikacin, gentamicin, neomycin, streptomycin, tobramycin Vaccines This list may not describe all possible interactions. Give your health care provider a list of all the  medicines, herbs, non-prescription drugs, or dietary supplements you use. Also tell them if you smoke, drink alcohol, or use illegal drugs. Some items may interact with your medicine. What should I watch for while using this medication? Your condition will be monitored carefully while you are receiving this medication. You may need blood work while taking this medication. This medication may make you feel generally unwell. This is not uncommon, as chemotherapy can affect healthy cells as well as cancer cells. Report any side effects. Continue your course of treatment even though you feel ill unless your care team tells you to stop. In some cases, you may be given additional medications to help with side effects. Follow all directions for their use. This medication may increase your risk of getting an infection. Call your care team for advice if you get a fever, chills, sore throat, or other symptoms of a cold or flu. Do not treat yourself. Try  to avoid being around people who are sick. Avoid taking medications that contain aspirin, acetaminophen, ibuprofen, naproxen, or ketoprofen unless instructed by your care team. These medications may hide a fever. Be careful brushing or flossing your teeth or using a toothpick because you may get an infection or bleed more easily. If you have any dental work done, tell your dentist you are receiving this medication. Talk to your care team if you wish to become pregnant or think you might be pregnant. This medication can cause serious birth defects. Talk to your care team about effective forms of contraception. Do not breast-feed while taking this medication. What side effects may I notice from receiving this medication? Side effects that you should report to your care team as soon as possible: Allergic reactions--skin rash, itching, hives, swelling of the face, lips, tongue, or throat Infection--fever, chills, cough, sore throat, wounds that don't heal, pain or  trouble when passing urine, general feeling of discomfort or being unwell Low red blood cell level--unusual weakness or fatigue, dizziness, headache, trouble breathing Pain, tingling, or numbness in the hands or feet, muscle weakness, change in vision, confusion or trouble speaking, loss of balance or coordination, trouble walking, seizures Unusual bruising or bleeding Side effects that usually do not require medical attention (report to your care team if they continue or are bothersome): Hair loss Nausea Unusual weakness or fatigue Vomiting This list may not describe all possible side effects. Call your doctor for medical advice about side effects. You may report side effects to FDA at 1-800-FDA-1088. Where should I keep my medication? This medication is given in a hospital or clinic. It will not be stored at home. NOTE: This sheet is a summary. It may not cover all possible information. If you have questions about this medicine, talk to your doctor, pharmacist, or health care provider.  2024 Elsevier/Gold Standard (2021-05-16 00:00:00)       To help prevent nausea and vomiting after your treatment, we encourage you to take your nausea medication as directed.  BELOW ARE SYMPTOMS THAT SHOULD BE REPORTED IMMEDIATELY: *FEVER GREATER THAN 100.4 F (38 C) OR HIGHER *CHILLS OR SWEATING *NAUSEA AND VOMITING THAT IS NOT CONTROLLED WITH YOUR NAUSEA MEDICATION *UNUSUAL SHORTNESS OF BREATH *UNUSUAL BRUISING OR BLEEDING *URINARY PROBLEMS (pain or burning when urinating, or frequent urination) *BOWEL PROBLEMS (unusual diarrhea, constipation, pain near the anus) TENDERNESS IN MOUTH AND THROAT WITH OR WITHOUT PRESENCE OF ULCERS (sore throat, sores in mouth, or a toothache) UNUSUAL RASH, SWELLING OR PAIN  UNUSUAL VAGINAL DISCHARGE OR ITCHING   Items with * indicate a potential emergency and should be followed up as soon as possible or go to the Emergency Department if any problems should  occur.  Please show the CHEMOTHERAPY ALERT CARD or IMMUNOTHERAPY ALERT CARD at check-in to the Emergency Department and triage nurse.  Should you have questions after your visit or need to cancel or reschedule your appointment, please contact [facility] [place] - A DEPT OF Eligha Bridegroom [facility] [phone]  and follow the prompts.  Office hours are 8:00 a.m. to 4:30 p.m. Monday - Friday. Please note that voicemails left after 4:00 p.m. may not be returned until the following business day.  We are closed weekends and major holidays. You have access to a nurse at all times for urgent questions. Please call the main number to the clinic [phone] and follow the prompts.  For any non-urgent questions, you may also contact your provider using MyChart. We  now offer e-Visits for anyone 46 and older to request care online for non-urgent symptoms. For details visit [email or web address].   Also download the MyChart app! Go to the app store, search "MyChart", open the app, select [place], and log in with your MyChart username and password.

## 2023-04-26 ENCOUNTER — Encounter (HOSPITAL_COMMUNITY): Payer: Self-pay | Admitting: Hematology

## 2023-04-26 ENCOUNTER — Encounter: Payer: Self-pay | Admitting: Hematology and Oncology

## 2023-04-26 LAB — CANCER ANTIGEN 27.29: CA 27.29: 603.7 U/mL — ABNORMAL HIGH (ref 0.0–38.6)

## 2023-04-29 ENCOUNTER — Encounter: Payer: Self-pay | Admitting: Hematology and Oncology

## 2023-04-29 ENCOUNTER — Encounter (HOSPITAL_COMMUNITY): Payer: Self-pay | Admitting: Hematology

## 2023-05-02 ENCOUNTER — Inpatient Hospital Stay: Payer: 59

## 2023-05-02 ENCOUNTER — Inpatient Hospital Stay: Payer: 59 | Admitting: Hematology

## 2023-05-02 VITALS — BP 133/85 | HR 87 | Temp 97.7°F | Resp 18

## 2023-05-02 DIAGNOSIS — Z5112 Encounter for antineoplastic immunotherapy: Secondary | ICD-10-CM | POA: Diagnosis not present

## 2023-05-02 DIAGNOSIS — Z170 Estrogen receptor positive status [ER+]: Secondary | ICD-10-CM

## 2023-05-02 LAB — COMPREHENSIVE METABOLIC PANEL WITH GFR
ALT: 25 U/L (ref 0–44)
AST: 35 U/L (ref 15–41)
Albumin: 3.4 g/dL — ABNORMAL LOW (ref 3.5–5.0)
Alkaline Phosphatase: 95 U/L (ref 38–126)
Anion gap: 10 (ref 5–15)
BUN: 10 mg/dL (ref 6–20)
CO2: 19 mmol/L — ABNORMAL LOW (ref 22–32)
Calcium: 8.6 mg/dL — ABNORMAL LOW (ref 8.9–10.3)
Chloride: 106 mmol/L (ref 98–111)
Creatinine, Ser: 0.58 mg/dL (ref 0.44–1.00)
GFR, Estimated: >60 mL/min (ref >60)
Glucose, Bld: 134 mg/dL — ABNORMAL HIGH (ref 70–99)
Potassium: 4 mmol/L (ref 3.5–5.1)
Sodium: 135 mmol/L (ref 135–145)
Total Bilirubin: 0.5 mg/dL (ref 0.0–1.2)
Total Protein: 6.9 g/dL (ref 6.5–8.1)

## 2023-05-02 LAB — CBC WITH DIFFERENTIAL/PLATELET
Abs Immature Granulocytes: 0.1 10*3/uL — ABNORMAL HIGH (ref 0.00–0.07)
Basophils Absolute: 0 10*3/uL (ref 0.0–0.1)
Basophils Relative: 1 %
Eosinophils Absolute: 0 10*3/uL (ref 0.0–0.5)
Eosinophils Relative: 1 %
HCT: 30.9 % — ABNORMAL LOW (ref 36.0–46.0)
Hemoglobin: 10 g/dL — ABNORMAL LOW (ref 12.0–15.0)
Immature Granulocytes: 2 %
Lymphocytes Relative: 28 %
Lymphs Abs: 1.2 10*3/uL (ref 0.7–4.0)
MCH: 31.9 pg (ref 26.0–34.0)
MCHC: 32.4 g/dL (ref 30.0–36.0)
MCV: 98.7 fL (ref 80.0–100.0)
Monocytes Absolute: 0.3 10*3/uL (ref 0.1–1.0)
Monocytes Relative: 6 %
Neutro Abs: 2.5 10*3/uL (ref 1.7–7.7)
Neutrophils Relative %: 62 %
Platelets: 176 10*3/uL (ref 150–400)
RBC: 3.13 MIL/uL — ABNORMAL LOW (ref 3.87–5.11)
RDW: 19.3 % — ABNORMAL HIGH (ref 11.5–15.5)
WBC: 4.2 10*3/uL (ref 4.0–10.5)
nRBC: 1.4 % — ABNORMAL HIGH (ref 0.0–0.2)

## 2023-05-02 LAB — MAGNESIUM: Magnesium: 1.7 mg/dL (ref 1.7–2.4)

## 2023-05-02 LAB — CANCER ANTIGEN 15-3: CA 15-3: 795 U/mL — ABNORMAL HIGH (ref 0.0–25.0)

## 2023-05-02 MED ORDER — SODIUM CHLORIDE 0.9% FLUSH
10.0000 mL | INTRAVENOUS | Status: DC | PRN
Start: 2023-05-02 — End: 2023-06-06
  Administered 2023-05-02: 10 mL

## 2023-05-02 MED ORDER — SODIUM CHLORIDE 0.9 % IV SOLN
235.0000 mg | Freq: Once | INTRAVENOUS | Status: AC
  Administered 2023-05-02: 240 mg via INTRAVENOUS
  Filled 2023-05-02 (×2): qty 24

## 2023-05-02 MED ORDER — DEXAMETHASONE SODIUM PHOSPHATE 10 MG/ML IJ SOLN
10.0000 mg | Freq: Once | INTRAMUSCULAR | Status: AC
  Administered 2023-05-02: 10 mg via INTRAVENOUS
  Filled 2023-05-02: qty 1

## 2023-05-02 MED ORDER — SODIUM CHLORIDE 0.9 % IV SOLN: INTRAVENOUS | Status: DC

## 2023-05-02 MED ORDER — HEPARIN SOD (PORK) LOCK FLUSH 100 UNIT/ML IV SOLN
500.0000 [IU] | Freq: Once | INTRAVENOUS | Status: AC | PRN
Start: 2023-05-02 — End: 2023-05-02
  Administered 2023-05-02: 500 [IU]

## 2023-05-02 MED ORDER — PACLITAXEL PROTEIN-BOUND CHEMO INJECTION 100 MG
125.0000 mg/m2 | Freq: Once | INTRAVENOUS | Status: AC
  Administered 2023-05-02: 225 mg via INTRAVENOUS
  Filled 2023-05-02 (×2): qty 45

## 2023-05-02 MED ORDER — SODIUM CHLORIDE 0.9% FLUSH
10.0000 mL | Freq: Once | INTRAVENOUS | Status: AC
  Administered 2023-05-02: 10 mL via INTRAVENOUS

## 2023-05-02 MED ORDER — PALONOSETRON HCL INJECTION 0.25 MG/5ML
0.2500 mg | Freq: Once | INTRAVENOUS | Status: AC
  Administered 2023-05-02: 0.25 mg via INTRAVENOUS
  Filled 2023-05-02: qty 5

## 2023-05-02 NOTE — Progress Notes (Signed)
Treatment given per orders. Patient tolerated it well without problems. Vitals stable and discharged home from clinic ambulatory. Follow up as scheduled.  

## 2023-05-02 NOTE — Addendum Note (Signed)
 Addended by: Harrel Lemon on: 05/02/2023 09:19 AM   Modules accepted: Orders

## 2023-05-02 NOTE — Patient Instructions (Signed)
 [facility] [place] - [facility]  Discharge Instructions: Thank you for choosing [facility] to provide your oncology and hematology care.  If you have a lab appointment with the Cancer Center - please note that after April 8th, 2024, all labs will be drawn in the cancer center.  You do not have to check in or register with the main entrance as you have in the past but will complete your check-in in the cancer center.  Wear comfortable clothing and clothing appropriate for easy access to any Portacath or PICC line.   We strive to give you quality time with your provider. You may need to reschedule your appointment if you arrive late (15 or more minutes).  Arriving late affects you and other patients whose appointments are after yours.  Also, if you miss three or more appointments without notifying the office, you may be dismissed from the clinic at the provider's discretion.      For prescription refill requests, have your pharmacy contact our office and allow 72 hours for refills to be completed.    Today you received the following chemotherapy and/or immunotherapy agents: protein bound abraxane, carboplatin    To help prevent nausea and vomiting after your treatment, we encourage you to take your nausea medication as directed.  BELOW ARE SYMPTOMS THAT SHOULD BE REPORTED IMMEDIATELY: *FEVER GREATER THAN 100.4 F (38 C) OR HIGHER *CHILLS OR SWEATING *NAUSEA AND VOMITING THAT IS NOT CONTROLLED WITH YOUR NAUSEA MEDICATION *UNUSUAL SHORTNESS OF BREATH *UNUSUAL BRUISING OR BLEEDING *URINARY PROBLEMS (pain or burning when urinating, or frequent urination) *BOWEL PROBLEMS (unusual diarrhea, constipation, pain near the anus) TENDERNESS IN MOUTH AND THROAT WITH OR WITHOUT PRESENCE OF ULCERS (sore throat, sores in mouth, or a toothache) UNUSUAL RASH, SWELLING OR PAIN  UNUSUAL VAGINAL DISCHARGE OR ITCHING   Items with * indicate a potential emergency  and should be followed up as soon as possible or go to the Emergency Department if any problems should occur.  Please show the CHEMOTHERAPY ALERT CARD or IMMUNOTHERAPY ALERT CARD at check-in to the Emergency Department and triage nurse.  Should you have questions after your visit or need to cancel or reschedule your appointment, please contact [facility] [place] - A DEPT OF [facility] Bridegroom [facility] [phone]  and follow the prompts.  Office hours are 8:00 a.m. to 4:30 p.m. Monday - Friday. Please note that voicemails left after 4:00 p.m. may not be returned until the following business day.  We are closed weekends and major holidays. You have access to a nurse at all times for urgent questions. Please call the main number to the clinic [phone] and follow the prompts.  For any non-urgent questions, you may also contact your provider using MyChart. We now offer e-Visits for anyone 35 and older to request care online for non-urgent symptoms. For details visit [email or web address].   Also download the MyChart app! Go to the app store, search "MyChart", open the app, select [place], and log in with your MyChart username and password.

## 2023-05-02 NOTE — Progress Notes (Signed)
 Labs reviewed, no new issues reported today by patient today. Will proceed with treatment as planned per protocol.

## 2023-05-16 ENCOUNTER — Inpatient Hospital Stay: Payer: 59

## 2023-05-16 ENCOUNTER — Encounter (HOSPITAL_COMMUNITY): Payer: Self-pay | Admitting: Hematology

## 2023-05-16 ENCOUNTER — Inpatient Hospital Stay (HOSPITAL_BASED_OUTPATIENT_CLINIC_OR_DEPARTMENT_OTHER): Payer: 59 | Admitting: Hematology

## 2023-05-16 ENCOUNTER — Inpatient Hospital Stay: Payer: 59 | Attending: Hematology and Oncology

## 2023-05-16 VITALS — BP 151/77 | HR 85 | Temp 98.9°F | Resp 18

## 2023-05-16 VITALS — BP 138/83 | HR 95 | Temp 98.2°F | Resp 18 | Wt 168.2 lb

## 2023-05-16 DIAGNOSIS — Z79899 Other long term (current) drug therapy: Secondary | ICD-10-CM | POA: Insufficient documentation

## 2023-05-16 DIAGNOSIS — R234 Changes in skin texture: Secondary | ICD-10-CM | POA: Diagnosis not present

## 2023-05-16 DIAGNOSIS — M7989 Other specified soft tissue disorders: Secondary | ICD-10-CM | POA: Insufficient documentation

## 2023-05-16 DIAGNOSIS — F17210 Nicotine dependence, cigarettes, uncomplicated: Secondary | ICD-10-CM | POA: Insufficient documentation

## 2023-05-16 DIAGNOSIS — C7951 Secondary malignant neoplasm of bone: Secondary | ICD-10-CM | POA: Diagnosis not present

## 2023-05-16 DIAGNOSIS — Z9049 Acquired absence of other specified parts of digestive tract: Secondary | ICD-10-CM | POA: Diagnosis not present

## 2023-05-16 DIAGNOSIS — M533 Sacrococcygeal disorders, not elsewhere classified: Secondary | ICD-10-CM | POA: Insufficient documentation

## 2023-05-16 DIAGNOSIS — Z8249 Family history of ischemic heart disease and other diseases of the circulatory system: Secondary | ICD-10-CM | POA: Insufficient documentation

## 2023-05-16 DIAGNOSIS — Z860101 Personal history of adenomatous and serrated colon polyps: Secondary | ICD-10-CM | POA: Diagnosis not present

## 2023-05-16 DIAGNOSIS — C787 Secondary malignant neoplasm of liver and intrahepatic bile duct: Secondary | ICD-10-CM | POA: Diagnosis not present

## 2023-05-16 DIAGNOSIS — Z1732 Human epidermal growth factor receptor 2 negative status: Secondary | ICD-10-CM | POA: Diagnosis not present

## 2023-05-16 DIAGNOSIS — Z170 Estrogen receptor positive status [ER+]: Secondary | ICD-10-CM | POA: Insufficient documentation

## 2023-05-16 DIAGNOSIS — K580 Irritable bowel syndrome with diarrhea: Secondary | ICD-10-CM | POA: Diagnosis not present

## 2023-05-16 DIAGNOSIS — Z79633 Long term (current) use of mitotic inhibitor: Secondary | ICD-10-CM | POA: Insufficient documentation

## 2023-05-16 DIAGNOSIS — Z79620 Long term (current) use of immunosuppressive biologic: Secondary | ICD-10-CM | POA: Diagnosis not present

## 2023-05-16 DIAGNOSIS — D72819 Decreased white blood cell count, unspecified: Secondary | ICD-10-CM | POA: Diagnosis not present

## 2023-05-16 DIAGNOSIS — Z1721 Progesterone receptor positive status: Secondary | ICD-10-CM | POA: Diagnosis not present

## 2023-05-16 DIAGNOSIS — Z5111 Encounter for antineoplastic chemotherapy: Secondary | ICD-10-CM | POA: Insufficient documentation

## 2023-05-16 DIAGNOSIS — Z79630 Long term (current) use of alkylating agent: Secondary | ICD-10-CM | POA: Insufficient documentation

## 2023-05-16 DIAGNOSIS — C50411 Malignant neoplasm of upper-outer quadrant of right female breast: Secondary | ICD-10-CM | POA: Diagnosis present

## 2023-05-16 DIAGNOSIS — Z90710 Acquired absence of both cervix and uterus: Secondary | ICD-10-CM | POA: Diagnosis not present

## 2023-05-16 DIAGNOSIS — Z833 Family history of diabetes mellitus: Secondary | ICD-10-CM | POA: Insufficient documentation

## 2023-05-16 DIAGNOSIS — Z5112 Encounter for antineoplastic immunotherapy: Secondary | ICD-10-CM | POA: Diagnosis present

## 2023-05-16 DIAGNOSIS — N6320 Unspecified lump in the left breast, unspecified quadrant: Secondary | ICD-10-CM | POA: Insufficient documentation

## 2023-05-16 DIAGNOSIS — Z79811 Long term (current) use of aromatase inhibitors: Secondary | ICD-10-CM | POA: Insufficient documentation

## 2023-05-16 LAB — CBC WITH DIFFERENTIAL/PLATELET
Abs Immature Granulocytes: 0.06 10*3/uL (ref 0.00–0.07)
Basophils Absolute: 0 10*3/uL (ref 0.0–0.1)
Basophils Relative: 1 %
Eosinophils Absolute: 0 10*3/uL (ref 0.0–0.5)
Eosinophils Relative: 0 %
HCT: 31.8 % — ABNORMAL LOW (ref 36.0–46.0)
Hemoglobin: 10.2 g/dL — ABNORMAL LOW (ref 12.0–15.0)
Immature Granulocytes: 2 %
Lymphocytes Relative: 30 %
Lymphs Abs: 1 10*3/uL (ref 0.7–4.0)
MCH: 31.8 pg (ref 26.0–34.0)
MCHC: 32.1 g/dL (ref 30.0–36.0)
MCV: 99.1 fL (ref 80.0–100.0)
Monocytes Absolute: 0.6 10*3/uL (ref 0.1–1.0)
Monocytes Relative: 16 %
Neutro Abs: 1.8 10*3/uL (ref 1.7–7.7)
Neutrophils Relative %: 51 %
Platelets: 172 10*3/uL (ref 150–400)
RBC: 3.21 MIL/uL — ABNORMAL LOW (ref 3.87–5.11)
RDW: 18.8 % — ABNORMAL HIGH (ref 11.5–15.5)
WBC: 3.5 10*3/uL — ABNORMAL LOW (ref 4.0–10.5)
nRBC: 0 % (ref 0.0–0.2)

## 2023-05-16 LAB — COMPREHENSIVE METABOLIC PANEL WITH GFR
ALT: 43 U/L (ref 0–44)
AST: 63 U/L — ABNORMAL HIGH (ref 15–41)
Albumin: 3.4 g/dL — ABNORMAL LOW (ref 3.5–5.0)
Alkaline Phosphatase: 121 U/L (ref 38–126)
Anion gap: 10 (ref 5–15)
BUN: 7 mg/dL (ref 6–20)
CO2: 21 mmol/L — ABNORMAL LOW (ref 22–32)
Calcium: 8.4 mg/dL — ABNORMAL LOW (ref 8.9–10.3)
Chloride: 105 mmol/L (ref 98–111)
Creatinine, Ser: 0.6 mg/dL (ref 0.44–1.00)
GFR, Estimated: >60 mL/min (ref >60)
Glucose, Bld: 107 mg/dL — ABNORMAL HIGH (ref 70–99)
Potassium: 3.6 mmol/L (ref 3.5–5.1)
Sodium: 136 mmol/L (ref 135–145)
Total Bilirubin: 0.7 mg/dL (ref 0.0–1.2)
Total Protein: 6.9 g/dL (ref 6.5–8.1)

## 2023-05-16 LAB — MAGNESIUM: Magnesium: 1.7 mg/dL (ref 1.7–2.4)

## 2023-05-16 MED ORDER — HEPARIN SOD (PORK) LOCK FLUSH 100 UNIT/ML IV SOLN
500.0000 [IU] | Freq: Once | INTRAVENOUS | Status: AC | PRN
  Administered 2023-05-16: 500 [IU]

## 2023-05-16 MED ORDER — SODIUM CHLORIDE 0.9% FLUSH
10.0000 mL | Freq: Once | INTRAVENOUS | Status: AC
  Administered 2023-05-16: 10 mL via INTRAVENOUS

## 2023-05-16 MED ORDER — SODIUM CHLORIDE 0.9% FLUSH
10.0000 mL | INTRAVENOUS | Status: DC | PRN
  Administered 2023-05-16: 10 mL

## 2023-05-16 MED ORDER — SODIUM CHLORIDE 0.9 % IV SOLN
200.0000 mg | Freq: Once | INTRAVENOUS | Status: AC
  Administered 2023-05-16: 200 mg via INTRAVENOUS
  Filled 2023-05-16: qty 8

## 2023-05-16 MED ORDER — PACLITAXEL PROTEIN-BOUND CHEMO INJECTION 100 MG
125.0000 mg/m2 | Freq: Once | INTRAVENOUS | Status: AC
  Administered 2023-05-16: 225 mg via INTRAVENOUS
  Filled 2023-05-16: qty 45

## 2023-05-16 MED ORDER — SODIUM CHLORIDE 0.9 % IV SOLN
240.0000 mg | Freq: Once | INTRAVENOUS | Status: AC
  Administered 2023-05-16: 240 mg via INTRAVENOUS
  Filled 2023-05-16: qty 24

## 2023-05-16 MED ORDER — SODIUM CHLORIDE 0.9 % IV SOLN: INTRAVENOUS | Status: DC

## 2023-05-16 MED ORDER — PALONOSETRON HCL INJECTION 0.25 MG/5ML
0.2500 mg | Freq: Once | INTRAVENOUS | Status: AC
Start: 2023-05-16 — End: 2023-05-16
  Administered 2023-05-16: 0.25 mg via INTRAVENOUS
  Filled 2023-05-16: qty 5

## 2023-05-16 MED ORDER — DEXAMETHASONE SODIUM PHOSPHATE 10 MG/ML IJ SOLN
10.0000 mg | Freq: Once | INTRAMUSCULAR | Status: AC
  Administered 2023-05-16: 10 mg via INTRAVENOUS
  Filled 2023-05-16: qty 1

## 2023-05-16 NOTE — Patient Instructions (Signed)
 [facility] [place] - A DEPT OF [facility]  Discharge Instructions: Thank you for choosing [facility] to provide your oncology and hematology care.  If you have a lab appointment with the Cancer Center - please note that after April 8th, 2024, all labs will be drawn in the cancer center.  You do not have to check in or register with the main entrance as you have in the past but will complete your check-in in the cancer center.  Wear comfortable clothing and clothing appropriate for easy access to any Portacath or PICC line.   We strive to give you quality time with your provider. You may need to reschedule your appointment if you arrive late (15 or more minutes).  Arriving late affects you and other patients whose appointments are after yours.  Also, if you miss three or more appointments without notifying the office, you may be dismissed from the clinic at the provider's discretion.      For prescription refill requests, have your pharmacy contact our office and allow 72 hours for refills to be completed.    Today you received the following chemotherapy and/or immunotherapy agents keytruda, abraxane, carboplatin;.       To help prevent nausea and vomiting after your treatment, we encourage you to take your nausea medication as directed.  BELOW ARE SYMPTOMS THAT SHOULD BE REPORTED IMMEDIATELY: *FEVER GREATER THAN 100.4 F (38 C) OR HIGHER *CHILLS OR SWEATING *NAUSEA AND VOMITING THAT IS NOT CONTROLLED WITH YOUR NAUSEA MEDICATION *UNUSUAL SHORTNESS OF BREATH *UNUSUAL BRUISING OR BLEEDING *URINARY PROBLEMS (pain or burning when urinating, or frequent urination) *BOWEL PROBLEMS (unusual diarrhea, constipation, pain near the anus) TENDERNESS IN MOUTH AND THROAT WITH OR WITHOUT PRESENCE OF ULCERS (sore throat, sores in mouth, or a toothache) UNUSUAL RASH, SWELLING OR PAIN  UNUSUAL VAGINAL DISCHARGE OR ITCHING   Items with * indicate a potential emergency  and should be followed up as soon as possible or go to the Emergency Department if any problems should occur.  Please show the CHEMOTHERAPY ALERT CARD or IMMUNOTHERAPY ALERT CARD at check-in to the Emergency Department and triage nurse.  Should you have questions after your visit or need to cancel or reschedule your appointment, please contact [facility] [place] - A DEPT OF [facility] [phone]  and follow the prompts.  Office hours are 8:00 a.m. to 4:30 p.m. Monday - Friday. Please note that voicemails left after 4:00 p.m. may not be returned until the following business day.  We are closed weekends and major holidays. You have access to a nurse at all times for urgent questions. Please call the main number to the clinic [phone] and follow the prompts.  For any non-urgent questions, you may also contact your provider using MyChart. We now offer e-Visits for anyone 79 and older to request care online for non-urgent symptoms. For details visit [email or web address].   Also download the MyChart app! Go to the app store, search "MyChart", open the app, select [place], and log in with your MyChart username and password.

## 2023-05-16 NOTE — Patient Instructions (Signed)

## 2023-05-16 NOTE — Progress Notes (Signed)
 [facility] [address], [place] [address]    Clinic Day:  05/16/2023  Referring physician: Assunta Found, MD  Patient Care Team: Assunta Found, MD as PCP - General (Family Medicine) Jena Gauss, Gerrit Friends, MD (Gastroenterology) Antony Blackbird, MD as Consulting Physician (Radiation Oncology) Manus Rudd, MD as Consulting Physician (General Surgery) Pershing Proud, RN as Oncology Nurse Navigator Donnelly Angelica, RN as Oncology Nurse Navigator Doreatha Massed, MD as Medical Oncologist (Medical Oncology) Therese Sarah, RN as Oncology Nurse Navigator (Medical Oncology)   ASSESSMENT & PLAN:   Assessment: 1.  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  Noticed skin changes in the right breast 3 months ago. - Mammogram (06/19/2022): Large 8-9 cm mass in the right breast with 2 abnormal axillary lymph nodes and skin thickening. 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  1.8 cm ill-defined enhancing mass in the lateral left breast suspicious for additional malignancy.  Right breast mass involves right chest wall including pectoralis and intercostal muscles.  Prominent subcentimeter left axillary lymph nodes. - PET scan (07/12/2022): Intensely hypermetabolic right breast mass with right axillary lymph nodes.  Hypermetabolic metastatic lymph node in the posterior triangle of the right neck.  Hypermetabolic left axillary lymph nodes.  No evidence of metastatic adenopathy in the mediastinum/abdomen/pelvis.  No evidence of visceral metastasis.  Multiple skeletal metastasis:  Left sacral ala 2.5 cm, left iliac bone, L4 and T12 vertebral bodies, multiple rib lesions, left scapular lesion. - She was evaluated by Dr. Al Pimple and was recommended treatment with ribociclib and anastrozole. - Germline mutation testing: Negative. - Anastrozole and ribociclib started on 07/25/2022, discontinued on 12/13/2022 due to progression. - PET scan (11/15/2022): Mixed response to therapy with overall progression of disease with new bone metastasis and liver metastasis.  New periportal lymph nodes. - Liver biopsy (12/07/2022): Metastatic poorly differentiated adenocarcinoma consistent with breast primary.  ER +2% with moderate staining, PR 0%, HER2 (1+). - Guardant360: APC G1312, T p53, MYC amplification, FGF R2 amplification, EGFR amplification - PD-L1 (22 C3): 0% - Abraxane day 1, day 8 every 21 days started on 12/21/2022, carboplatin AUC on day 1 and day 8 added with cycle 2 on 01/10/2023 - NGS (12/07/2022): MS-stable, TMB-low, PTEN pathogenic variant, genomic LOH high (18%), HER2 (1+), PD-L1 (22 C3) CPS-10.    2.  Social/family history: - She lives at home with her husband.  Used OCP for approximately 16-18 years.  Underwent hysterectomy after endometrial ablation when she was found to have fibroids causing pain.  No family history of breast or other malignancies.  Smokes 1 pack of cigarettes per day.    Plan: 1.  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 - She has completed 3 cycles of carboplatin, Abraxane and Keytruda started on 03/14/2023. - She denied any immunotherapy related side effects. - Labs today: AST elevated at 63.  Creatinine and rest of the LFTs are normal.  CBC with mild leukopenia.  CA 15-3 795 and CA 27-29 is 603. - She may proceed with cycle 4 today.  RTC 3 weeks for follow-up.   Will obtain PET scan prior to next visit.   2.  Bone metastasis: - Denies any jaw pains or dental issues.  Calcium is 8.4 with almond 3.4.  Continue Zometa every 12 weeks.  3.  Hypomagnesemia: - Continue magnesium twice daily.  Magnesium is normal at 1.7.  Magnesium is causing diarrhea.  She is taking with Imodium.  4.  Leg swelling: - May take Lasix 20 mg in the mornings as needed.  She has not started it yet.  Albumin improved to 3.4.     Orders Placed This Encounter  Procedures   NM PET Image Restage (PS) Skull Base to Thigh (F-18 FDG)    Standing Status:   Future    Expected Date:   06/15/2023    Expiration Date:   05/15/2024    If indicated for the ordered procedure, I authorize the administration of a radiopharmaceutical per Radiology protocol:   Yes    Preferred imaging location?:   [facility]   Cancer antigen 15-3    Standing Status:   Future    Expected Date:   06/06/2023    Expiration Date:   06/05/2024   Cancer antigen 27.29    Standing Status:   Future    Expected Date:   06/06/2023    Expiration Date:   06/05/2024   CBC with Differential    Standing Status:   Future    Expected Date:   06/06/2023    Expiration Date:   06/05/2024   Comprehensive metabolic panel    Standing Status:   Future    Expected Date:   06/06/2023    Expiration Date:   06/05/2024   Magnesium    Standing Status:   Future    Expected Date:   06/06/2023    Expiration Date:   06/05/2024   CBC with Differential    Standing Status:   Future    Expected Date:   06/13/2023    Expiration Date:   06/12/2024   Comprehensive metabolic panel    Standing Status:   Future    Expected Date:   06/13/2023    Expiration Date:   06/12/2024   Magnesium    Standing Status:   Future    Expected Date:   06/13/2023    Expiration Date:   06/12/2024   Cancer antigen 15-3    Standing Status:   Future    Expected Date:   06/27/2023    Expiration Date:   06/26/2024   Cancer antigen 27.29    Standing Status:   Future    Expected Date:    06/27/2023    Expiration Date:   06/26/2024   CBC with Differential    Standing Status:   Future    Expected Date:   06/27/2023    Expiration Date:   06/26/2024   Comprehensive metabolic panel    Standing Status:   Future    Expected Date:   06/27/2023    Expiration Date:   06/26/2024   Magnesium    Standing Status:   Future    Expected Date:   06/27/2023    Expiration Date:   06/26/2024   CBC with Differential    Standing Status:   Future    Expected Date:   07/04/2023    Expiration Date:   07/03/2024   Comprehensive metabolic panel  Standing Status:   Future    Expected Date:   07/04/2023    Expiration Date:   07/03/2024   Magnesium    Standing Status:   Future    Expected Date:   07/04/2023    Expiration Date:   07/03/2024      Mikeal Hawthorne R Teague,acting as a scribe for Doreatha Massed, MD.,have documented all relevant documentation on the behalf of Doreatha Massed, MD,as directed by  Doreatha Massed, MD while in the presence of Doreatha Massed, MD.  I, Doreatha Massed MD, have reviewed the above documentation for accuracy and completeness, and I agree with the above.      Doreatha Massed, MD   4/10/20255:56 PM  CHIEF COMPLAINT:   Diagnosis: metastatic inflammatory right breast cancer    Cancer Staging  Malignant neoplasm of upper-outer quadrant of right breast in female, estrogen receptor positive (HCC) Staging form: Breast, AJCC 8th Edition - Clinical: Stage IV (cT4, cN2, cM1, G2, ER+, PR+, HER2-) - Signed by Rachel Moulds, MD on 07/17/2022    Prior Therapy: none  Current Therapy:  Anastrozole and ribociclib    HISTORY OF PRESENT ILLNESS:   Oncology History  Malignant neoplasm of upper-outer quadrant of right breast in female, estrogen receptor positive (HCC)  06/19/2022 Mammogram   Mammogram showed large mass in the right breast which appears to measure 8 to 9 cm on ultrasound, 2 abnormal right axillary lymph node and a third borderline  lymph node.  Skin thickening noted along the medial aspect of left breast which may be an extension of the marked skin thickening diffusely on the right.  Otherwise no left breast abnormalities are identified.   06/22/2022 Pathology Results   Needle core biopsy from the right breast upper outer quadrant showed grade 2 IDC, both lymph nodes with metastatic adenocarcinoma.  Prognostic showed ER 90% positive strong staining PR 70% positive strong staining Ki-67 of 20% HER2 negative by IHC 1+   07/03/2022 Initial Diagnosis   Malignant neoplasm of upper-outer quadrant of right breast in female, estrogen receptor positive (HCC)   07/04/2022 Cancer Staging   Staging form: Breast, AJCC 8th Edition - Clinical: Stage IV (cT4, cN2, cM1, G2, ER+, PR+, HER2-) - Signed by Rachel Moulds, MD on 07/17/2022 Stage prefix: Initial diagnosis Histologic grading system: 3 grade system   07/16/2022 - 07/16/2022 Chemotherapy   Patient is on Treatment Plan : BREAST ADJUVANT DOSE DENSE AC q14d / PACLitaxel q7d      Genetic Testing   [facility] Custom Panel+RNA was Negative. Report date is 07/10/2022.  The Custom Hereditary Cancers Panel offered by [facility] includes sequencing and/or deletion duplication testing of the following 43 genes: APC, ATM, AXIN2, BAP1, BARD1, BMPR1A, BRCA1, BRCA2, BRIP1, CDH1, CDK4, CDKN2A (p14ARF and p16INK4a only), CHEK2, CTNNA1, EPCAM (Deletion/duplication testing only), FH, GREM1 (promoter region duplication testing only), HOXB13, KIT, MBD4, MEN1, MLH1, MSH2, MSH3, MSH6, MUTYH, NF1, NHTL1, PALB2, PDGFRA, PMS2, POLD1, POLE, PTEN, RAD51C, RAD51D, SMAD4, SMARCA4. STK11, TP53, TSC1, TSC2, and VHL.   12/13/2022 - 12/13/2022 Chemotherapy   Patient is on Treatment Plan : BREAST METASTATIC Fam-Trastuzumab Deruxtecan-nxki (Enhertu) (5.4) q21d     12/21/2022 -  Chemotherapy   Patient is on Treatment Plan : BREAST Paclitaxel-Albumin (Abraxane) (100) D1,8 + Carboplatin AUC2 D1,8 every 3 weeks, q21d         INTERVAL HISTORY:   Monica Soto is a 58 y.o. female presenting to clinic today for follow up of metastatic inflammatory right breast cancer. She was last seen by me on  04/25/23.  Today, she states that she is doing well overall. Her appetite level is at 100%. Her energy level is at 75%.   She denies any skin rashes or wheezing. Adriannah does note diarrhea when taking Magnesium, which is controlled with imodium. She is taking Magnesium as prescribed. She has received lasix, but has not yet required it.   Brissia notes alternating leg aching for past  she attributes to walking longer than usual over the weekend. She reports mild drainage from right breast wound which is stable.   PAST MEDICAL HISTORY:   Past Medical History: Past Medical History:  Diagnosis Date   Depression    Diabetes mellitus without complication (HCC)    Erosive esophagitis 04/26/2010   EGD by Dr. Jena Gauss, small hiatal hernia   GERD (gastroesophageal reflux disease)    History of stomach ulcers    IBS (irritable bowel syndrome)    Diarrhea predominant   Microscopic colitis 04/26/2010   Colonoscopy by Dr. Jena Gauss, good response with Entocort   Tubular adenoma of colon 04/26/2010   Junction of descending and sigmoid 40 CM from anus    Surgical History: Past Surgical History:  Procedure Laterality Date   ABDOMINAL HYSTERECTOMY     APPENDECTOMY  2/11   Dr. Malvin Johns with a delayed closure   BREAST BIOPSY Right 06/22/2022   [place] RT BREAST BX W LOC DEV 1ST LESION IMG BX SPEC US GUIDE 06/22/2022 [facility]MOGRAP[facility]   PORTACATH PLACEMENT N/A 07/16/2022   Procedure: INSERTION PORT-A-CATH;  Surgeon: Manus Rudd, MD;  Location: [facility];  Service: General;  Laterality: N/A;   TONSILLECTOMY     TUBAL LIGATION      Social History: Social History   Socioeconomic History   Marital status: Married    Spouse name: Not on file   Number of children: Not on file   Years of education: Not on file   Highest education  level: Not on file  Occupational History    Employer: [employer]    Comment: Third shift  Tobacco Use   Smoking status: Every Day    Current packs/day: 0.50    Types: Cigarettes   Smokeless tobacco: Never  Vaping Use   Vaping status: Never Used  Substance and Sexual Activity   Alcohol use: Yes    Alcohol/week: 1.0 standard drink of alcohol    Types: 1 Glasses of wine per week    Comment: seldom   Drug use: No   Sexual activity: Yes    Birth control/protection: Surgical  Other Topics Concern   Not on file  Social History Narrative   Not on file   Social Drivers of Health   Financial Resource Strain: Not on file  Food Insecurity: No Food Insecurity (07/04/2022)   Hunger Vital Sign    Worried About Running Out of Food in the Last Year: Never true    Ran Out of Food in the Last Year: Never true  Transportation Needs: No Transportation Needs (07/04/2022)   PRAPARE - Administrator, Civil Service (Medical): No    Lack of Transportation (Non-Medical): No  Physical Activity: Not on file  Stress: Not on file  Social Connections: Not on file  Intimate Partner Violence: Not on file    Family History: Family History  Problem Relation Age of Onset   Diabetes Mother    Coronary artery disease Mother    Healthy Father     Current Medications:  Current Outpatient Medications:  Aspirin-Acetaminophen-Caffeine (GOODY HEADACHE PO), Take 1 packet by mouth 2 (two) times daily as needed (for pain)., Disp: , Rfl:    Cholecalciferol 1.25 MG (50000 UT) capsule, Take 1 capsule by mouth once a week., Disp: , Rfl:    fluticasone (FLONASE) 50 MCG/ACT nasal spray, Place 1 spray into both nostrils daily., Disp: 16 g, Rfl: 0   furosemide (LASIX) 20 MG tablet, Take 1 tablet (20 mg total) by mouth daily as needed for edema., Disp: 30 tablet, Rfl: 2   HYDROcodone-acetaminophen (NORCO/VICODIN) 5-325 MG tablet, Take 1 tablet by mouth every 6 (six) hours as needed for moderate pain.,  Disp: 15 tablet, Rfl: 0   lidocaine-prilocaine (EMLA) cream, Apply to affected area once, Disp: 30 g, Rfl: 3   magnesium oxide (MAG-OX) 400 (240 Mg) MG tablet, Take 1 tablet (400 mg total) by mouth 2 (two) times daily., Disp: 60 tablet, Rfl: 3   ondansetron (ZOFRAN) 8 MG tablet, TAKE 1 TABLET(8 MG) BY MOUTH EVERY 8 HOURS AS NEEDED FOR NAUSEA OR VOMITING. START THIRD DAY AFTER DOXORUBICIN/ CYCLOPHOSPHAMIDE CHEMOTHERAPY, Disp: 30 tablet, Rfl: 1   prochlorperazine (COMPAZINE) 10 MG tablet, TAKE 1 TABLET(10 MG) BY MOUTH EVERY 6 HOURS AS NEEDED FOR NAUSEA OR VOMITING, Disp: 30 tablet, Rfl: 1 No current facility-administered medications for this visit.  Facility-Administered Medications Ordered in Other Visits:    0.9 %  sodium chloride infusion, , Intravenous, Continuous, Doreatha Massed, MD, Stopped at 05/02/23 1153   0.9 %  sodium chloride infusion, , Intravenous, Continuous, Doreatha Massed, MD, Stopped at 05/16/23 1450   sodium chloride flush (NS) 0.9 % injection 10 mL, 10 mL, Intracatheter, PRN, Doreatha Massed, MD, 10 mL at 05/02/23 1152   sodium chloride flush (NS) 0.9 % injection 10 mL, 10 mL, Intracatheter, PRN, Doreatha Massed, MD, 10 mL at 05/16/23 1449   Allergies: No Known Allergies  REVIEW OF SYSTEMS:   Review of Systems  Constitutional:  Negative for chills, fatigue and fever.  HENT:   Negative for lump/mass, mouth sores, nosebleeds, sore throat and trouble swallowing.   Eyes:  Negative for eye problems.  Respiratory:  Negative for cough and shortness of breath.   Cardiovascular:  Negative for chest pain, leg swelling and palpitations.       +swollen hands  Gastrointestinal:  Positive for diarrhea. Negative for abdominal pain, constipation, nausea and vomiting.  Genitourinary:  Negative for bladder incontinence, difficulty urinating, dysuria, frequency, hematuria and nocturia.   Musculoskeletal:  Negative for arthralgias, back pain, flank pain, myalgias and  neck pain.       +bilateral lower extremity pain, 7/10 severity  Skin:  Negative for itching and rash.  Neurological:  Positive for numbness (in fingers). Negative for dizziness and headaches.  Hematological:  Does not bruise/bleed easily.  Psychiatric/Behavioral:  Negative for depression, sleep disturbance and suicidal ideas. The patient is not nervous/anxious.   All other systems reviewed and are negative.    VITALS:   Blood pressure 138/83, pulse 95, temperature 98.2 F (36.8 C), temperature source Oral, resp. rate 18, weight 168 lb 3.2 oz (76.3 kg), SpO2 100%.  Wt Readings from Last 3 Encounters:  05/16/23 168 lb 3.2 oz (76.3 kg)  05/02/23 167 lb 6.4 oz (75.9 kg)  04/25/23 166 lb 3.6 oz (75.4 kg)    Body mass index is 27.99 kg/m.  Performance status (ECOG): 1 - Symptomatic but completely ambulatory   PHYSICAL EXAM:   Physical Exam Vitals and nursing note reviewed. Exam conducted with a chaperone present.  Constitutional:      Appearance: Normal appearance.  Cardiovascular:     Rate and Rhythm: Normal rate and regular rhythm.     Pulses: Normal pulses.     Heart sounds: Normal heart sounds.  Pulmonary:     Effort: Pulmonary effort is normal.     Breath sounds: Normal breath sounds.  Abdominal:     Palpations: Abdomen is soft. There is no hepatomegaly, splenomegaly or mass.     Tenderness: There is no abdominal tenderness.  Musculoskeletal:     Right lower leg: No edema.     Left lower leg: No edema.  Lymphadenopathy:     Cervical: No cervical adenopathy.     Right cervical: No superficial, deep or posterior cervical adenopathy.    Left cervical: No superficial, deep or posterior cervical adenopathy.     Upper Body:     Right upper body: No supraclavicular or axillary adenopathy.     Left upper body: No supraclavicular or axillary adenopathy.  Neurological:     General: No focal deficit present.     Mental Status: She is alert and oriented to person, place, and  time.  Psychiatric:        Mood and Affect: Mood normal.        Behavior: Behavior normal.     LABS:      Latest Ref Rng & Units 05/16/2023   10:02 AM 05/02/2023    8:15 AM 04/25/2023    9:18 AM  CBC  WBC 4.0 - 10.5 K/uL 3.5  4.2  3.8   Hemoglobin 12.0 - 15.0 g/dL 45.4  09.8  11.9   Hematocrit 36.0 - 46.0 % 31.8  30.9  31.5   Platelets 150 - 400 K/uL 172  176  167       Latest Ref Rng & Units 05/16/2023   10:02 AM 05/02/2023    8:15 AM 04/25/2023    9:18 AM  CMP  Glucose 70 - 99 mg/dL 147  829  562   BUN 6 - 20 mg/dL 7  10  8    Creatinine 0.44 - 1.00 mg/dL 1.30  8.65  7.84   Sodium 135 - 145 mmol/L 136  135  137   Potassium 3.5 - 5.1 mmol/L 3.6  4.0  3.9   Chloride 98 - 111 mmol/L 105  106  105   CO2 22 - 32 mmol/L 21  19  20    Calcium 8.9 - 10.3 mg/dL 8.4  8.6  8.6   Total Protein 6.5 - 8.1 g/dL 6.9  6.9  7.5   Total Bilirubin 0.0 - 1.2 mg/dL 0.7  0.5  0.6   Alkaline Phos 38 - 126 U/L 121  95  95   AST 15 - 41 U/L 63  35  45   ALT 0 - 44 U/L 43  25  26      No results found for: "CEA1", "CEA" / No results found for: "CEA1", "CEA" No results found for: "PSA1" No results found for: "ONG295" No results found for: "CAN125"  No results found for: "TOTALPROTELP", "ALBUMINELP", "A1GS", "A2GS", "BETS", "BETA2SER", "GAMS", "MSPIKE", "SPEI" No results found for: "TIBC", "FERRITIN", "IRONPCTSAT" No results found for: "LDH"   STUDIES:   No results found.

## 2023-05-16 NOTE — Progress Notes (Signed)

## 2023-05-17 ENCOUNTER — Other Ambulatory Visit: Payer: Self-pay

## 2023-05-17 LAB — CANCER ANTIGEN 15-3: CA 15-3: 699 U/mL — ABNORMAL HIGH (ref 0.0–25.0)

## 2023-05-17 LAB — CANCER ANTIGEN 27.29: CA 27.29: 924.9 U/mL — ABNORMAL HIGH (ref 0.0–38.6)

## 2023-05-20 ENCOUNTER — Other Ambulatory Visit: Payer: Self-pay | Admitting: *Deleted

## 2023-05-20 ENCOUNTER — Telehealth: Payer: Self-pay | Admitting: *Deleted

## 2023-05-20 ENCOUNTER — Other Ambulatory Visit: Payer: Self-pay | Admitting: Hematology and Oncology

## 2023-05-20 DIAGNOSIS — Z170 Estrogen receptor positive status [ER+]: Secondary | ICD-10-CM

## 2023-05-20 MED ORDER — DIPHENOXYLATE-ATROPINE 2.5-0.025 MG PO TABS: ORAL_TABLET | ORAL | 3 refills | Status: AC

## 2023-05-20 NOTE — Telephone Encounter (Signed)
 Patient called and has been having diarrhea since Saturday and has been taking imodium, which has not been effective. Script sent to Dr. Katragadda for Lomotil.  Encouraged her to stay as hydrated as possible.  Patient aware and verbalized understanding.

## 2023-05-23 ENCOUNTER — Other Ambulatory Visit: Payer: Self-pay

## 2023-05-23 ENCOUNTER — Inpatient Hospital Stay

## 2023-05-23 VITALS — BP 131/73 | HR 81 | Resp 19

## 2023-05-23 DIAGNOSIS — Z5112 Encounter for antineoplastic immunotherapy: Secondary | ICD-10-CM | POA: Diagnosis not present

## 2023-05-23 DIAGNOSIS — C50411 Malignant neoplasm of upper-outer quadrant of right female breast: Secondary | ICD-10-CM

## 2023-05-23 LAB — COMPREHENSIVE METABOLIC PANEL WITH GFR
ALT: 25 U/L (ref 0–44)
AST: 36 U/L (ref 15–41)
Albumin: 3.6 g/dL (ref 3.5–5.0)
Alkaline Phosphatase: 115 U/L (ref 38–126)
Anion gap: 11 (ref 5–15)
BUN: 9 mg/dL (ref 6–20)
CO2: 21 mmol/L — ABNORMAL LOW (ref 22–32)
Calcium: 8.8 mg/dL — ABNORMAL LOW (ref 8.9–10.3)
Chloride: 102 mmol/L (ref 98–111)
Creatinine, Ser: 0.68 mg/dL (ref 0.44–1.00)
GFR, Estimated: >60 mL/min (ref >60)
Glucose, Bld: 137 mg/dL — ABNORMAL HIGH (ref 70–99)
Potassium: 3.2 mmol/L — ABNORMAL LOW (ref 3.5–5.1)
Sodium: 134 mmol/L — ABNORMAL LOW (ref 135–145)
Total Bilirubin: 0.4 mg/dL (ref 0.0–1.2)
Total Protein: 7.2 g/dL (ref 6.5–8.1)

## 2023-05-23 LAB — CBC WITH DIFFERENTIAL/PLATELET
Abs Immature Granulocytes: 0.07 10*3/uL (ref 0.00–0.07)
Basophils Absolute: 0 10*3/uL (ref 0.0–0.1)
Basophils Relative: 1 %
Eosinophils Absolute: 0 10*3/uL (ref 0.0–0.5)
Eosinophils Relative: 1 %
HCT: 32.7 % — ABNORMAL LOW (ref 36.0–46.0)
Hemoglobin: 10.5 g/dL — ABNORMAL LOW (ref 12.0–15.0)
Immature Granulocytes: 2 %
Lymphocytes Relative: 27 %
Lymphs Abs: 1.1 10*3/uL (ref 0.7–4.0)
MCH: 31.5 pg (ref 26.0–34.0)
MCHC: 32.1 g/dL (ref 30.0–36.0)
MCV: 98.2 fL (ref 80.0–100.0)
Monocytes Absolute: 0.3 10*3/uL (ref 0.1–1.0)
Monocytes Relative: 6 %
Neutro Abs: 2.7 10*3/uL (ref 1.7–7.7)
Neutrophils Relative %: 63 %
Platelets: 164 10*3/uL (ref 150–400)
RBC: 3.33 MIL/uL — ABNORMAL LOW (ref 3.87–5.11)
RDW: 17.8 % — ABNORMAL HIGH (ref 11.5–15.5)
WBC: 4.2 10*3/uL (ref 4.0–10.5)
nRBC: 0.9 % — ABNORMAL HIGH (ref 0.0–0.2)

## 2023-05-23 LAB — MAGNESIUM: Magnesium: 1.6 mg/dL — ABNORMAL LOW (ref 1.7–2.4)

## 2023-05-23 MED ORDER — SODIUM CHLORIDE 0.9 % IV SOLN
INTRAVENOUS | Status: DC
Start: 2023-05-23 — End: 2023-05-23

## 2023-05-23 MED ORDER — PALONOSETRON HCL INJECTION 0.25 MG/5ML
0.2500 mg | Freq: Once | INTRAVENOUS | Status: AC
  Administered 2023-05-23: 0.25 mg via INTRAVENOUS
  Filled 2023-05-23: qty 5

## 2023-05-23 MED ORDER — PACLITAXEL PROTEIN-BOUND CHEMO INJECTION 100 MG
125.0000 mg/m2 | Freq: Once | INTRAVENOUS | Status: AC
  Administered 2023-05-23: 225 mg via INTRAVENOUS
  Filled 2023-05-23: qty 45

## 2023-05-23 MED ORDER — POTASSIUM CHLORIDE CRYS ER 20 MEQ PO TBCR
40.0000 meq | EXTENDED_RELEASE_TABLET | Freq: Once | ORAL | Status: AC
  Administered 2023-05-23: 40 meq via ORAL
  Filled 2023-05-23: qty 2

## 2023-05-23 MED ORDER — MAGNESIUM SULFATE 2 GM/50ML IV SOLN
2.0000 g | Freq: Once | INTRAVENOUS | Status: AC
  Administered 2023-05-23: 2 g via INTRAVENOUS
  Filled 2023-05-23: qty 50

## 2023-05-23 MED ORDER — HEPARIN SOD (PORK) LOCK FLUSH 100 UNIT/ML IV SOLN
500.0000 [IU] | Freq: Once | INTRAVENOUS | Status: AC | PRN
  Administered 2023-05-23: 500 [IU]

## 2023-05-23 MED ORDER — DEXAMETHASONE SODIUM PHOSPHATE 10 MG/ML IJ SOLN
10.0000 mg | Freq: Once | INTRAMUSCULAR | Status: AC
  Administered 2023-05-23: 10 mg via INTRAVENOUS
  Filled 2023-05-23: qty 1

## 2023-05-23 MED ORDER — SODIUM CHLORIDE 0.9% FLUSH
10.0000 mL | Freq: Once | INTRAVENOUS | Status: AC
  Administered 2023-05-23: 10 mL

## 2023-05-23 MED ORDER — SODIUM CHLORIDE 0.9 % IV SOLN
240.0000 mg | Freq: Once | INTRAVENOUS | Status: AC
Start: 2023-05-23 — End: 2023-05-23
  Administered 2023-05-23: 240 mg via INTRAVENOUS
  Filled 2023-05-23: qty 24

## 2023-05-23 NOTE — Patient Instructions (Signed)
 [facility] [place] - A DEPT OF [facility]  Discharge Instructions: Thank you for choosing [facility] to provide your oncology and hematology care.  If you have a lab appointment with the Cancer Center - please note that after April 8th, 2024, all labs will be drawn in the cancer center.  You do not have to check in or register with the main entrance as you have in the past but will complete your check-in in the cancer center.  Wear comfortable clothing and clothing appropriate for easy access to any Portacath or PICC line.   We strive to give you quality time with your provider. You may need to reschedule your appointment if you arrive late (15 or more minutes).  Arriving late affects you and other patients whose appointments are after yours.  Also, if you miss three or more appointments without notifying the office, you may be dismissed from the clinic at the provider's discretion.      For prescription refill requests, have your pharmacy contact our office and allow 72 hours for refills to be completed.    Today you received the following chemotherapy and/or immunotherapy agents Abraxane/Carboplatin   To help prevent nausea and vomiting after your treatment, we encourage you to take your nausea medication as directed.  BELOW ARE SYMPTOMS THAT SHOULD BE REPORTED IMMEDIATELY: *FEVER GREATER THAN 100.4 F (38 C) OR HIGHER *CHILLS OR SWEATING *NAUSEA AND VOMITING THAT IS NOT CONTROLLED WITH YOUR NAUSEA MEDICATION *UNUSUAL SHORTNESS OF BREATH *UNUSUAL BRUISING OR BLEEDING *URINARY PROBLEMS (pain or burning when urinating, or frequent urination) *BOWEL PROBLEMS (unusual diarrhea, constipation, pain near the anus) TENDERNESS IN MOUTH AND THROAT WITH OR WITHOUT PRESENCE OF ULCERS (sore throat, sores in mouth, or a toothache) UNUSUAL RASH, SWELLING OR PAIN  UNUSUAL VAGINAL DISCHARGE OR ITCHING   Items with * indicate a potential emergency and should be  followed up as soon as possible or go to the Emergency Department if any problems should occur.  Please show the CHEMOTHERAPY ALERT CARD or IMMUNOTHERAPY ALERT CARD at check-in to the Emergency Department and triage nurse.  Should you have questions after your visit or need to cancel or reschedule your appointment, please contact [facility] [place] - A DEPT OF [facility]igh[facility] [phone]  and follow the prompts.  Office hours are 8:00 a.m. to 4:30 p.m. Monday - Friday. Please note that voicemails left after 4:00 p.m. may not be returned until the following business day.  We are closed weekends and major holidays. You have access to a nurse at all times for urgent questions. Please call the main number to the clinic [phone] and follow the prompts.  For any non-urgent questions, you may also contact your provider using MyChart. We now offer e-Visits for anyone 70 and older to request care online for non-urgent symptoms. For details visit [email or web address].   Also download the MyChart app! Go to the app store, search "MyChart", open the app, select [facility], and log in with your MyChart username and password.

## 2023-05-23 NOTE — Progress Notes (Signed)
 Magnesium 2g IV per standing orders. Pt updated. Patient tolerated chemotherapy with no complaints voiced.  Side effects with management reviewed with understanding verbalized.  Port site clean and dry with no bruising or swelling noted at site.  Good blood return noted before and after administration of chemotherapy.  Band aid applied.  Patient left in satisfactory condition with VSS and no s/s of distress noted. All follow ups as scheduled.   [place] [employer]

## 2023-05-30 ENCOUNTER — Ambulatory Visit (HOSPITAL_COMMUNITY)
Admission: RE | Admit: 2023-05-30 | Discharge: 2023-05-30 | Disposition: A | Discharge status: Home / Self Care | Source: Ambulatory Visit | Attending: Hematology | Admitting: Hematology

## 2023-05-30 DIAGNOSIS — C50411 Malignant neoplasm of upper-outer quadrant of right female breast: Secondary | ICD-10-CM | POA: Diagnosis present

## 2023-05-30 DIAGNOSIS — Z170 Estrogen receptor positive status [ER+]: Secondary | ICD-10-CM | POA: Diagnosis present

## 2023-05-30 MED ORDER — FLUDEOXYGLUCOSE F - 18 (FDG) INJECTION
7.3000 | Freq: Once | INTRAVENOUS | Status: AC | PRN
  Administered 2023-05-30: 7.3 via INTRAVENOUS

## 2023-06-05 NOTE — Progress Notes (Signed)
 [facility] [address] [place] [address]    Clinic Day:  06/06/2023  Referring physician: Minus Amel, MD  Patient Care Team: Minus Amel, MD as PCP - General (Family Medicine) Riley Cheadle, Windsor Hatcher, MD (Gastroenterology) Retta Caster, MD as Consulting Physician (Radiation Oncology) Dareen Ebbing, MD as Consulting Physician (General Surgery) Auther Bo, RN as Oncology Nurse Navigator Alane Hsu, RN as Oncology Nurse Navigator Paulett Boros, MD as Medical Oncologist (Medical Oncology) Gerhard Knuckles, RN as Oncology Nurse Navigator (Medical Oncology)   ASSESSMENT & PLAN:   Assessment: 1.  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  Noticed skin changes in the right breast 3 months ago. - Mammogram (06/19/2022): Large 8-9 cm mass in the right breast with 2 abnormal axillary lymph nodes and skin thickening. 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  1.8 cm ill-defined enhancing mass in the lateral left breast suspicious for additional malignancy.  Right breast mass involves right chest wall including pectoralis and intercostal muscles.  Prominent subcentimeter left axillary lymph nodes. - PET scan (07/12/2022): Intensely hypermetabolic right breast mass with right axillary lymph nodes.  Hypermetabolic metastatic lymph node in the posterior triangle of the right neck.  Hypermetabolic left axillary lymph nodes.  No evidence of metastatic adenopathy in the mediastinum/abdomen/pelvis.  No evidence of visceral metastasis.  Multiple skeletal metastasis:  Left sacral ala 2.5 cm, left iliac bone, L4 and T12 vertebral bodies, multiple rib lesions, left scapular lesion. - Monica Soto was evaluated by Dr. Arno Bibles and was recommended treatment with ribociclib  and anastrozole . - Germline mutation testing: Negative. - Anastrozole  and ribociclib  started on 07/25/2022, discontinued on 12/13/2022 due to progression. - PET scan (11/15/2022): Mixed response to therapy with overall progression of disease with new bone metastasis and liver metastasis.  New periportal lymph nodes. - Liver biopsy (12/07/2022): Metastatic poorly differentiated adenocarcinoma consistent with breast primary.  ER +2% with moderate staining, PR 0%, HER2 (1+). - Guardant360: APC G1312, T p53, MYC amplification, FGF R2 amplification, EGFR amplification - PD-L1 (22 C3): 0% - Abraxane  day 1, day 8 every 21 days started on 12/21/2022, carboplatin  AUC on day 1 and day 8 added with cycle 2 on 01/10/2023 - NGS (12/07/2022): MS-stable, TMB-low, PTEN pathogenic variant, genomic LOH high (18%), HER2 (1+), PD-L1 (22 C3) CPS-10.    2.  Social/family history: - Monica Soto lives at home with Monica Soto husband.  Used OCP for approximately 16-18 years.  Underwent hysterectomy after endometrial ablation when Monica Soto was found to have fibroids causing pain.  No family history of breast or other malignancies.  Smokes 1 pack of cigarettes per day.    Plan: 1.  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 - PET scan on 05/30/2023 showed worsened metastatic disease in the liver, bones.  New increased hypermetabolic nodularity along the right breast cutaneous surface. - I have recommended change in therapy.  We discussed Sacituzumab given on days 1, 8 every 21 days.  Due to myelosuppression, I have recommended G-CSF on day 9. - We discussed side effects and literature was given.  We will start Monica Soto on 06/10/2023.  I will see Monica Soto back on cycle 2-day 1.   2.  Bone metastasis: - Continue  Zometa  every 12 weeks.  Denies any dental issues or jaw pains.  3.  Hypomagnesemia: - Monica Soto has not been taking magnesium  for the last few days due to diarrhea.  Magnesium  is low today at 1.4.  Monica Soto will receive IV magnesium .  Monica Soto will start back on magnesium  twice daily.  4.  Leg swelling: - Continue Lasix  20 mg in the mornings as needed.     Orders Placed This Encounter  Procedures   CBC with Differential    Standing Status:   Future    Expected Date:   06/07/2023    Expiration Date:   06/06/2024   Comprehensive metabolic panel    Standing Status:   Future    Expected Date:   06/07/2023    Expiration Date:   06/06/2024   Magnesium     Standing Status:   Future    Expected Date:   06/07/2023    Expiration Date:   06/06/2024   Phosphorus    Standing Status:   Future    Expected Date:   06/07/2023    Expiration Date:   06/06/2024   CBC with Differential    Standing Status:   Future    Expected Date:   06/14/2023    Expiration Date:   06/13/2024   Comprehensive metabolic panel    Standing Status:   Future    Expected Date:   06/14/2023    Expiration Date:   06/13/2024   Magnesium     Standing Status:   Future    Expected Date:   06/14/2023    Expiration Date:   06/13/2024   Phosphorus    Standing Status:   Future    Expected Date:   06/14/2023    Expiration Date:   06/13/2024   CBC with Differential    Standing Status:   Future    Expected Date:   06/28/2023    Expiration Date:   06/27/2024   Comprehensive metabolic panel    Standing Status:   Future    Expected Date:   06/28/2023    Expiration Date:   06/27/2024   Magnesium     Standing Status:   Future    Expected Date:   06/28/2023    Expiration Date:   06/27/2024   Phosphorus    Standing Status:   Future    Expected Date:   06/28/2023    Expiration Date:   06/27/2024   CBC with Differential    Standing Status:   Future    Expected Date:   07/05/2023    Expiration Date:   07/04/2024   Comprehensive metabolic panel    Standing Status:   Future     Expected Date:   07/05/2023    Expiration Date:   07/04/2024   Magnesium     Standing Status:   Future    Expected Date:   07/05/2023    Expiration Date:   07/04/2024   Phosphorus    Standing Status:   Future    Expected Date:   07/05/2023    Expiration Date:   07/04/2024   CBC with Differential    Standing Status:   Future    Expected Date:   07/19/2023    Expiration Date:   07/18/2024   Comprehensive metabolic panel    Standing Status:   Future    Expected Date:  07/19/2023    Expiration Date:   07/18/2024   Magnesium     Standing Status:   Future    Expected Date:   07/19/2023    Expiration Date:   07/18/2024   Phosphorus    Standing Status:   Future    Expected Date:   07/19/2023    Expiration Date:   07/18/2024   CBC with Differential    Standing Status:   Future    Expected Date:   07/26/2023    Expiration Date:   07/25/2024   Comprehensive metabolic panel    Standing Status:   Future    Expected Date:   07/26/2023    Expiration Date:   07/25/2024   Magnesium     Standing Status:   Future    Expected Date:   07/26/2023    Expiration Date:   07/25/2024   Phosphorus    Standing Status:   Future    Expected Date:   07/26/2023    Expiration Date:   07/25/2024   CBC with Differential    Standing Status:   Future    Expected Date:   08/09/2023    Expiration Date:   08/08/2024   Comprehensive metabolic panel    Standing Status:   Future    Expected Date:   08/09/2023    Expiration Date:   08/08/2024   Magnesium     Standing Status:   Future    Expected Date:   08/09/2023    Expiration Date:   08/08/2024   Phosphorus    Standing Status:   Future    Expected Date:   08/09/2023    Expiration Date:   08/08/2024   CBC with Differential    Standing Status:   Future    Expected Date:   08/16/2023    Expiration Date:   08/15/2024   Comprehensive metabolic panel    Standing Status:   Future    Expected Date:   08/16/2023    Expiration Date:   08/15/2024   Magnesium     Standing Status:   Future     Expected Date:   08/16/2023    Expiration Date:   08/15/2024   Phosphorus    Standing Status:   Future    Expected Date:   08/16/2023    Expiration Date:   08/15/2024      Hurman Maiden R Teague,acting as a scribe for Paulett Boros, MD.,have documented all relevant documentation on the behalf of Paulett Boros, MD,as directed by  Paulett Boros, MD while in the presence of Paulett Boros, MD.  I, Paulett Boros MD, have reviewed the above documentation for accuracy and completeness, and I agree with the above.      Paulett Boros, MD   5/1/202510:00 AM  CHIEF COMPLAINT:   Diagnosis: metastatic inflammatory right breast cancer    Cancer Staging  Malignant neoplasm of upper-outer quadrant of right breast in female, estrogen receptor positive (HCC) Staging form: Breast, AJCC 8th Edition - Clinical: Stage IV (cT4, cN2, cM1, G2, ER+, PR+, HER2-) - Signed by Iruku, Praveena, MD on 07/17/2022    Prior Therapy: none  Current Therapy:  Anastrozole  and ribociclib     HISTORY OF PRESENT ILLNESS:   Oncology History  Malignant neoplasm of upper-outer quadrant of right breast in female, estrogen receptor positive (HCC)  06/19/2022 Mammogram   Mammogram showed large mass in the right breast which appears to measure 8 to 9 cm on ultrasound, 2 abnormal right axillary lymph node and a third borderline  lymph node.  Skin thickening noted along the medial aspect of left breast which may be an extension of the marked skin thickening diffusely on the right.  Otherwise no left breast abnormalities are identified.   06/22/2022 Pathology Results   Needle core biopsy from the right breast upper outer quadrant showed grade 2 IDC, both lymph nodes with metastatic adenocarcinoma.  Prognostic showed ER 90% positive strong staining PR 70% positive strong staining Ki-67 of 20% HER2 negative by IHC 1+   07/03/2022 Initial Diagnosis   Malignant neoplasm of upper-outer quadrant of right  breast in female, estrogen receptor positive (HCC)   07/04/2022 Cancer Staging   Staging form: Breast, AJCC 8th Edition - Clinical: Stage IV (cT4, cN2, cM1, G2, ER+, PR+, HER2-) - Signed by Murleen Arms, MD on 07/17/2022 Stage prefix: Initial diagnosis Histologic grading system: 3 grade system   07/16/2022 - 07/16/2022 Chemotherapy   Patient is on Treatment Plan : BREAST ADJUVANT DOSE DENSE AC q14d / PACLitaxel  q7d      Genetic Testing   [facility] Custom Panel+RNA was Negative. Report date is 07/10/2022.  The Custom Hereditary Cancers Panel offered by [facility] includes sequencing and/or deletion duplication testing of the following 43 genes: APC, ATM, AXIN2, BAP1, BARD1, BMPR1A, BRCA1, BRCA2, BRIP1, CDH1, CDK4, CDKN2A (p14ARF and p16INK4a only), CHEK2, CTNNA1, EPCAM (Deletion/duplication testing only), FH, GREM1 (promoter region duplication testing only), HOXB13, KIT, MBD4, MEN1, MLH1, MSH2, MSH3, MSH6, MUTYH, NF1, NHTL1, PALB2, PDGFRA, PMS2, POLD1, POLE, PTEN, RAD51C, RAD51D, SMAD4, SMARCA4. STK11, TP53, TSC1, TSC2, and VHL.   12/13/2022 - 12/13/2022 Chemotherapy   Patient is on Treatment Plan : BREAST METASTATIC Fam-Trastuzumab Deruxtecan-nxki (Enhertu) (5.4) q21d     12/21/2022 - 05/23/2023 Chemotherapy   Patient is on Treatment Plan : BREAST Paclitaxel -Albumin (Abraxane ) (100) D1,8 + Carboplatin  AUC2 D1,8 every 3 weeks, q21d     06/07/2023 -  Chemotherapy   Patient is on Treatment Plan : BREAST Sacituzumab govitecan-hziy Chaya Cord) D1,8 q21d        INTERVAL HISTORY:   Monica Soto is a 58 y.o. female presenting to clinic today for follow up of metastatic inflammatory right breast cancer. Monica Soto was last seen by me on 05/16/23.  Since Monica Soto last visit, Monica Soto underwent restaging PET on 05/30/22 that found: Substantially worsened burden of metastatic disease to the liver including new and enlarged lesions. Substantially worsened burden of metastatic disease to the skeleton including new and enlarged lesions. New  and increased hypermetabolic nodularity along the right breast cutaneous surface, compatible with cutaneous metastatic disease. Mildly accentuated metabolic activity in a right hilar lymph node, maximum SUV 3.3 and formerly 2.8. This is nonspecific and could be reactive or metastatic. Mild left anterior descending coronary atherosclerosis. Aortic Atherosclerosis.   Today, Monica Soto states that Monica Soto is doing well overall. Monica Soto appetite level is at 80%. Monica Soto energy level is at 70%. Jaidalyn notes changing taste, particularly with spicy and seasoned foods. Monica Soto denies any decrease in appetite.   Monica Soto reports diarrhea last week that improved with lomotil  and imodium. Monica Soto notes lomotil  will occasionally give Monica Soto dull headaches, as they occur around an hour after taking it. Monica Soto denies any other headaches.  Juliett reports numbness that comes and goes in the bilateral toes. Monica Soto denies any neuropathy in the upper extremities. Monica Soto denies any vision changes.   Inas reports Monica Soto takes Claritin a few days before and after Zometa  infusion. Monica Soto has taken Lasix  once since Monica Soto last visit with me due to minimal edema. Monica Soto temporarily discontinued Magnesium  due  to diarrhea for the past few days, but was other wise taking BID.   PAST MEDICAL HISTORY:   Past Medical History: Past Medical History:  Diagnosis Date   Depression    Diabetes mellitus without complication (HCC)    Erosive esophagitis 04/26/2010   EGD by Dr. Riley Cheadle, small hiatal hernia   GERD (gastroesophageal reflux disease)    History of stomach ulcers    IBS (irritable bowel syndrome)    Diarrhea predominant   Microscopic colitis 04/26/2010   Colonoscopy by Dr. Riley Cheadle, good response with Entocort   Tubular adenoma of colon 04/26/2010   Junction of descending and sigmoid 40 CM from anus    Surgical History: Past Surgical History:  Procedure Laterality Date   ABDOMINAL HYSTERECTOMY     APPENDECTOMY  2/11   Dr. Tina Fordyce with a delayed closure   BREAST BIOPSY  Right 06/22/2022   US  RT BREAST BX W LOC DEV 1ST LESION IMG BX SPEC US  GUIDE 06/22/2022 [facility] MAMMOGRAPHY   PORTACATH PLACEMENT N/A 07/16/2022   Procedure: INSERTION PORT-A-CATH;  Surgeon: Dareen Ebbing, MD;  Location: [facility];  Service: General;  Laterality: N/A;   TONSILLECTOMY     TUBAL LIGATION      Social History: Social History   Socioeconomic History   Marital status: Married    Spouse name: Not on file   Number of children: Not on file   Years of education: Not on file   Highest education level: Not on file  Occupational History    Employer: [employer]    Comment: Third shift  Tobacco Use   Smoking status: Every Day    Current packs/day: 0.50    Types: Cigarettes   Smokeless tobacco: Never  Vaping Use   Vaping status: Never Used  Substance and Sexual Activity   Alcohol use: Yes    Alcohol/week: 1.0 standard drink of alcohol    Types: 1 Glasses of wine per week    Comment: seldom   Drug use: No   Sexual activity: Yes    Birth control/protection: Surgical  Other Topics Concern   Not on file  Social History Narrative   Not on file   Social Drivers of Health   Financial Resource Strain: Not on file  Food Insecurity: No Food Insecurity (07/04/2022)   Hunger Vital Sign    Worried About Running Out of Food in the Last Year: Never true    Ran Out of Food in the Last Year: Never true  Transportation Needs: No Transportation Needs (07/04/2022)   PRAPARE - Administrator, Civil Service (Medical): No    Lack of Transportation (Non-Medical): No  Physical Activity: Not on file  Stress: Not on file  Social Connections: Not on file  Intimate Partner Violence: Not on file    Family History: Family History  Problem Relation Age of Onset   Diabetes Mother    Coronary artery disease Mother    Healthy Father     Current Medications:  Current Outpatient Medications:    Aspirin-Acetaminophen -Caffeine (GOODY HEADACHE PO), Take 1 packet  by mouth 2 (two) times daily as needed (for pain)., Disp: , Rfl:    Cholecalciferol 1.25 MG (50000 UT) capsule, Take 1 capsule by mouth once a week., Disp: , Rfl:    diphenoxylate -atropine  (LOMOTIL ) 2.5-0.025 MG tablet, Take 2 tablets after first loose stool and 1 after each loose stool thereafter.  Do not exceed 8 tablets in 1 day., Disp: 30 tablet, Rfl: 3  fluticasone  (FLONASE ) 50 MCG/ACT nasal spray, Place 1 spray into both nostrils daily., Disp: 16 g, Rfl: 0   furosemide  (LASIX ) 20 MG tablet, Take 1 tablet (20 mg total) by mouth daily as needed for edema., Disp: 30 tablet, Rfl: 2   HYDROcodone -acetaminophen  (NORCO/VICODIN) 5-325 MG tablet, Take 1 tablet by mouth every 6 (six) hours as needed for moderate pain., Disp: 15 tablet, Rfl: 0   lidocaine -prilocaine  (EMLA ) cream, Apply to affected area once, Disp: 30 g, Rfl: 3   magnesium  oxide (MAG-OX) 400 (240 Mg) MG tablet, Take 1 tablet (400 mg total) by mouth 2 (two) times daily., Disp: 60 tablet, Rfl: 3   ondansetron  (ZOFRAN ) 8 MG tablet, TAKE 1 TABLET(8 MG) BY MOUTH EVERY 8 HOURS AS NEEDED FOR NAUSEA OR VOMITING. START THIRD DAY AFTER DOXORUBICIN/ CYCLOPHOSPHAMIDE CHEMOTHERAPY, Disp: 30 tablet, Rfl: 1   prochlorperazine  (COMPAZINE ) 10 MG tablet, TAKE 1 TABLET(10 MG) BY MOUTH EVERY 6 HOURS AS NEEDED FOR NAUSEA OR VOMITING, Disp: 30 tablet, Rfl: 1 No current facility-administered medications for this visit.  Facility-Administered Medications Ordered in Other Visits:    heparin  lock flush 100 unit/mL, 500 Units, Intravenous, Once, Paulett Boros, MD   magnesium  sulfate IVPB 4 g 100 mL, 4 g, Intravenous, Once, Paulett Boros, MD, Last Rate: 100 mL/hr at 06/06/23 0949, 4 g at 06/06/23 0949   Allergies: No Known Allergies  REVIEW OF SYSTEMS:   Review of Systems  Constitutional:  Negative for chills, fatigue and fever.  HENT:   Negative for lump/mass, mouth sores, nosebleeds, sore throat and trouble swallowing.   Eyes:  Negative for  eye problems.  Respiratory:  Positive for cough. Negative for shortness of breath.   Cardiovascular:  Negative for chest pain, leg swelling and palpitations.  Gastrointestinal:  Positive for diarrhea and nausea. Negative for abdominal pain, constipation and vomiting.  Genitourinary:  Negative for bladder incontinence, difficulty urinating, dysuria, frequency, hematuria and nocturia.   Musculoskeletal:  Negative for arthralgias, back pain, flank pain, myalgias and neck pain.       +bilateral leg pain, 3/10 severity  Skin:  Negative for itching and rash.  Neurological:  Positive for numbness (in toes). Negative for dizziness and headaches.  Hematological:  Does not bruise/bleed easily.  Psychiatric/Behavioral:  Negative for depression, sleep disturbance and suicidal ideas. The patient is not nervous/anxious.   All other systems reviewed and are negative.    VITALS:   Pulse (!) 109, temperature 98.2 F (36.8 C), temperature source Oral, weight 160 lb 0.9 oz (72.6 kg).  Wt Readings from Last 3 Encounters:  06/06/23 160 lb 0.9 oz (72.6 kg)  05/16/23 168 lb 3.2 oz (76.3 kg)  05/02/23 167 lb 6.4 oz (75.9 kg)    Body mass index is 26.63 kg/m.  Performance status (ECOG): 1 - Symptomatic but completely ambulatory   PHYSICAL EXAM:   Physical Exam Vitals and nursing note reviewed. Exam conducted with a chaperone present.  Constitutional:      Appearance: Normal appearance.  Cardiovascular:     Rate and Rhythm: Normal rate and regular rhythm.     Pulses: Normal pulses.     Heart sounds: Normal heart sounds.  Pulmonary:     Effort: Pulmonary effort is normal.     Breath sounds: Normal breath sounds.  Abdominal:     Palpations: Abdomen is soft. There is no hepatomegaly, splenomegaly or mass.     Tenderness: There is no abdominal tenderness.  Musculoskeletal:     Right lower leg: No edema.  Left lower leg: No edema.  Lymphadenopathy:     Cervical: No cervical adenopathy.      Right cervical: No superficial, deep or posterior cervical adenopathy.    Left cervical: No superficial, deep or posterior cervical adenopathy.     Upper Body:     Right upper body: No supraclavicular or axillary adenopathy.     Left upper body: No supraclavicular or axillary adenopathy.  Neurological:     General: No focal deficit present.     Mental Status: Monica Soto is alert and oriented to person, place, and time.  Psychiatric:        Mood and Affect: Mood normal.        Behavior: Behavior normal.     LABS:      Latest Ref Rng & Units 06/06/2023    8:52 AM 05/23/2023    8:28 AM 05/16/2023   10:02 AM  CBC  WBC 4.0 - 10.5 K/uL 3.6  4.2  3.5   Hemoglobin 12.0 - 15.0 g/dL 16.1  09.6  04.5   Hematocrit 36.0 - 46.0 % 31.5  32.7  31.8   Platelets 150 - 400 K/uL 164  164  172       Latest Ref Rng & Units 06/06/2023    8:52 AM 05/23/2023    8:28 AM 05/16/2023   10:02 AM  CMP  Glucose 70 - 99 mg/dL 409  811  914   BUN 6 - 20 mg/dL 9  9  7    Creatinine 0.44 - 1.00 mg/dL 7.82  9.56  2.13   Sodium 135 - 145 mmol/L 135  134  136   Potassium 3.5 - 5.1 mmol/L 3.4  3.2  3.6   Chloride 98 - 111 mmol/L 104  102  105   CO2 22 - 32 mmol/L 20  21  21    Calcium 8.9 - 10.3 mg/dL 8.4  8.8  8.4   Total Protein 6.5 - 8.1 g/dL 6.3  7.2  6.9   Total Bilirubin 0.0 - 1.2 mg/dL 0.5  0.4  0.7   Alkaline Phos 38 - 126 U/L 157  115  121   AST 15 - 41 U/L 51  36  63   ALT 0 - 44 U/L 27  25  43      No results found for: "CEA1", "CEA" / No results found for: "CEA1", "CEA" No results found for: "PSA1" No results found for: "YQM578" No results found for: "CAN125"  No results found for: "TOTALPROTELP", "ALBUMINELP", "A1GS", "A2GS", "BETS", "BETA2SER", "GAMS", "MSPIKE", "SPEI" No results found for: "TIBC", "FERRITIN", "IRONPCTSAT" No results found for: "LDH"   STUDIES:   NM PET Image Restage (PS) Skull Base to Thigh (F-18 FDG) Result Date: 06/04/2023 CLINICAL DATA:  Subsequent treatment strategy for right  breast cancer. EXAM: NUCLEAR MEDICINE PET SKULL BASE TO THIGH TECHNIQUE: 7.3 mCi F-18 FDG was injected intravenously. Full-ring PET imaging was performed from the skull base to thigh after the radiotracer. CT data was obtained and used for attenuation correction and anatomic localization. Fasting blood glucose: 95 mg/dl COMPARISON:  46/96/2952 FINDINGS: Mediastinal blood pool activity: SUV max 2.1 Liver activity: SUV max NA NECK: No significant abnormal hypermetabolic activity in this region. Incidental CT findings: None. CHEST: New and increased hypermetabolic nodularity along the right breast cutaneous surface covering substantially more area than on the prior exam, maximum SUV of the central conglomerate abnormal activity is 9.0. A new medial breast cutaneous lesion has a maximum SUV of 9.4. Faint  activity along scarring in the right axilla adjacent to a small clip, maximum SUV 3.4 and previously 2.0. Mildly accentuated metabolic activity in a right hilar lymph node, maximum SUV 3.3 and formerly 2.8. Incidental CT findings: Right Port-A-Cath tip: Right atrium. Mild left anterior descending coronary atherosclerosis. Likely therapy related cutaneous and subcutaneous stranding along the breasts presternal region. ABDOMEN/PELVIS: Substantially worsened burden of metastatic disease to the liver including new and enlarged lesions. The left hepatic lobe is especially affected with representative maximum SUV of 9.1. A new lesion posteriorly in the dome of the right hepatic lobe has a maximum SUV of 6.8 and demonstrates metabolic activity measuring 2.0 cm in diameter. Other new foci of hypermetabolic activity in the right hepatic lobe are also present. Presumed physiologic activity in bowel, especially the colon. Incidental CT findings: Atherosclerosis is present, including aortoiliac atherosclerotic disease. SKELETON: Extensive and widespread hypermetabolic skeletal lesions scattered throughout the axial skeleton as well  as the left scapula and bilateral proximal femurs. Index worsened lesion in the left scapula with maximum SUV 7.0, formerly 5.3. Index worsened left posterior iliac bone lesion maximum SUV 7.3, formerly 3.5. Incidental CT findings: Scattered sclerotic lesions in the skeleton only some of which are hypermetabolic, the non hypermetabolic regions are compatible with previously effectively treated metastatic disease. Some of the newly hypermetabolic skeletal lesions are not readily apparent on the CT data. IMPRESSION: 1. Substantially worsened burden of metastatic disease to the liver including new and enlarged lesions. 2. Substantially worsened burden of metastatic disease to the skeleton including new and enlarged lesions. 3. New and increased hypermetabolic nodularity along the right breast cutaneous surface, compatible with cutaneous metastatic disease. 4. Mildly accentuated metabolic activity in a right hilar lymph node, maximum SUV 3.3 and formerly 2.8. This is nonspecific and could be reactive or metastatic. 5. Mild left anterior descending coronary atherosclerosis. Aortic Atherosclerosis (ICD10-I70.0). Electronically Signed   By: Freida Jes M.D.   On: 06/04/2023 12:13

## 2023-06-06 ENCOUNTER — Inpatient Hospital Stay

## 2023-06-06 ENCOUNTER — Inpatient Hospital Stay: Attending: Hematology and Oncology

## 2023-06-06 ENCOUNTER — Inpatient Hospital Stay (HOSPITAL_BASED_OUTPATIENT_CLINIC_OR_DEPARTMENT_OTHER): Admitting: Hematology

## 2023-06-06 VITALS — BP 132/70 | HR 93 | Temp 98.0°F | Resp 20

## 2023-06-06 VITALS — HR 109 | Temp 98.2°F | Wt 160.1 lb

## 2023-06-06 DIAGNOSIS — Z5111 Encounter for antineoplastic chemotherapy: Secondary | ICD-10-CM | POA: Insufficient documentation

## 2023-06-06 DIAGNOSIS — Z860101 Personal history of adenomatous and serrated colon polyps: Secondary | ICD-10-CM | POA: Diagnosis not present

## 2023-06-06 DIAGNOSIS — I2510 Atherosclerotic heart disease of native coronary artery without angina pectoris: Secondary | ICD-10-CM | POA: Insufficient documentation

## 2023-06-06 DIAGNOSIS — M7989 Other specified soft tissue disorders: Secondary | ICD-10-CM | POA: Insufficient documentation

## 2023-06-06 DIAGNOSIS — Z9049 Acquired absence of other specified parts of digestive tract: Secondary | ICD-10-CM | POA: Insufficient documentation

## 2023-06-06 DIAGNOSIS — Z79811 Long term (current) use of aromatase inhibitors: Secondary | ICD-10-CM | POA: Insufficient documentation

## 2023-06-06 DIAGNOSIS — Z5112 Encounter for antineoplastic immunotherapy: Secondary | ICD-10-CM | POA: Diagnosis present

## 2023-06-06 DIAGNOSIS — K580 Irritable bowel syndrome with diarrhea: Secondary | ICD-10-CM | POA: Diagnosis not present

## 2023-06-06 DIAGNOSIS — Z833 Family history of diabetes mellitus: Secondary | ICD-10-CM | POA: Insufficient documentation

## 2023-06-06 DIAGNOSIS — Z1721 Progesterone receptor positive status: Secondary | ICD-10-CM | POA: Diagnosis not present

## 2023-06-06 DIAGNOSIS — Z8249 Family history of ischemic heart disease and other diseases of the circulatory system: Secondary | ICD-10-CM | POA: Insufficient documentation

## 2023-06-06 DIAGNOSIS — C787 Secondary malignant neoplasm of liver and intrahepatic bile duct: Secondary | ICD-10-CM | POA: Diagnosis not present

## 2023-06-06 DIAGNOSIS — I700 Atherosclerosis of aorta: Secondary | ICD-10-CM | POA: Diagnosis not present

## 2023-06-06 DIAGNOSIS — Z79899 Other long term (current) drug therapy: Secondary | ICD-10-CM | POA: Insufficient documentation

## 2023-06-06 DIAGNOSIS — K30 Functional dyspepsia: Secondary | ICD-10-CM | POA: Diagnosis not present

## 2023-06-06 DIAGNOSIS — M533 Sacrococcygeal disorders, not elsewhere classified: Secondary | ICD-10-CM | POA: Diagnosis not present

## 2023-06-06 DIAGNOSIS — Z170 Estrogen receptor positive status [ER+]: Secondary | ICD-10-CM | POA: Diagnosis not present

## 2023-06-06 DIAGNOSIS — R234 Changes in skin texture: Secondary | ICD-10-CM | POA: Diagnosis not present

## 2023-06-06 DIAGNOSIS — N6320 Unspecified lump in the left breast, unspecified quadrant: Secondary | ICD-10-CM | POA: Insufficient documentation

## 2023-06-06 DIAGNOSIS — Z1731 Human epidermal growth factor receptor 2 positive status: Secondary | ICD-10-CM | POA: Diagnosis not present

## 2023-06-06 DIAGNOSIS — F17210 Nicotine dependence, cigarettes, uncomplicated: Secondary | ICD-10-CM | POA: Diagnosis not present

## 2023-06-06 DIAGNOSIS — C50411 Malignant neoplasm of upper-outer quadrant of right female breast: Secondary | ICD-10-CM

## 2023-06-06 DIAGNOSIS — C7951 Secondary malignant neoplasm of bone: Secondary | ICD-10-CM | POA: Insufficient documentation

## 2023-06-06 DIAGNOSIS — Z90710 Acquired absence of both cervix and uterus: Secondary | ICD-10-CM | POA: Insufficient documentation

## 2023-06-06 LAB — COMPREHENSIVE METABOLIC PANEL WITH GFR
ALT: 27 U/L (ref 0–44)
AST: 51 U/L — ABNORMAL HIGH (ref 15–41)
Albumin: 3.1 g/dL — ABNORMAL LOW (ref 3.5–5.0)
Alkaline Phosphatase: 157 U/L — ABNORMAL HIGH (ref 38–126)
Anion gap: 11 (ref 5–15)
BUN: 9 mg/dL (ref 6–20)
CO2: 20 mmol/L — ABNORMAL LOW (ref 22–32)
Calcium: 8.4 mg/dL — ABNORMAL LOW (ref 8.9–10.3)
Chloride: 104 mmol/L (ref 98–111)
Creatinine, Ser: 0.67 mg/dL (ref 0.44–1.00)
GFR, Estimated: >60 mL/min (ref >60)
Glucose, Bld: 132 mg/dL — ABNORMAL HIGH (ref 70–99)
Potassium: 3.4 mmol/L — ABNORMAL LOW (ref 3.5–5.1)
Sodium: 135 mmol/L (ref 135–145)
Total Bilirubin: 0.5 mg/dL (ref 0.0–1.2)
Total Protein: 6.3 g/dL — ABNORMAL LOW (ref 6.5–8.1)

## 2023-06-06 LAB — CBC WITH DIFFERENTIAL/PLATELET
Abs Immature Granulocytes: 0.07 10*3/uL (ref 0.00–0.07)
Basophils Absolute: 0 10*3/uL (ref 0.0–0.1)
Basophils Relative: 1 %
Eosinophils Absolute: 0 10*3/uL (ref 0.0–0.5)
Eosinophils Relative: 0 %
HCT: 31.5 % — ABNORMAL LOW (ref 36.0–46.0)
Hemoglobin: 10 g/dL — ABNORMAL LOW (ref 12.0–15.0)
Immature Granulocytes: 2 %
Lymphocytes Relative: 25 %
Lymphs Abs: 0.9 10*3/uL (ref 0.7–4.0)
MCH: 31.5 pg (ref 26.0–34.0)
MCHC: 31.7 g/dL (ref 30.0–36.0)
MCV: 99.4 fL (ref 80.0–100.0)
Monocytes Absolute: 0.3 10*3/uL (ref 0.1–1.0)
Monocytes Relative: 9 %
Neutro Abs: 2.3 10*3/uL (ref 1.7–7.7)
Neutrophils Relative %: 63 %
Platelets: 164 10*3/uL (ref 150–400)
RBC: 3.17 MIL/uL — ABNORMAL LOW (ref 3.87–5.11)
RDW: 18.8 % — ABNORMAL HIGH (ref 11.5–15.5)
WBC: 3.6 10*3/uL — ABNORMAL LOW (ref 4.0–10.5)
nRBC: 0 % (ref 0.0–0.2)

## 2023-06-06 LAB — MAGNESIUM: Magnesium: 1.4 mg/dL — ABNORMAL LOW (ref 1.7–2.4)

## 2023-06-06 MED ORDER — SODIUM CHLORIDE 0.9% FLUSH
10.0000 mL | INTRAVENOUS | Status: DC | PRN
  Administered 2023-06-06: 10 mL via INTRAVENOUS

## 2023-06-06 MED ORDER — HEPARIN SOD (PORK) LOCK FLUSH 100 UNIT/ML IV SOLN
500.0000 [IU] | Freq: Once | INTRAVENOUS | Status: AC
  Administered 2023-06-06: 500 [IU] via INTRAVENOUS

## 2023-06-06 MED ORDER — MAGNESIUM SULFATE 4 GM/100ML IV SOLN
4.0000 g | Freq: Once | INTRAVENOUS | Status: AC
  Administered 2023-06-06: 4 g via INTRAVENOUS
  Filled 2023-06-06: qty 100

## 2023-06-06 NOTE — Progress Notes (Signed)
 DISCONTINUE ON PATHWAY REGIMEN - Breast     Pembrolizumab : A cycle is every 21 days:     Pembrolizumab     Chemotherapy: A cycle is every 28 days:     Nab-paclitaxel  (protein bound)   **Always confirm dose/schedule in your pharmacy ordering system**  PRIOR TREATMENT: [id number]: Pembrolizumab  200 mg q21 Days + Nab-Paclitaxel  (Abraxane ) 100 mg/m2 D1, 8, 15 q28 Days  START ON PATHWAY REGIMEN - Breast     A cycle is every 21 days:     Sacituzumab govitecan-hziy   **Always confirm dose/schedule in your pharmacy ordering system**  Patient Characteristics: Distant Metastases or Locoregional Recurrent Disease - Unresected, M0 or Locally Advanced Unresectable Disease Progressing after Neoadjuvant and Local Therapies, M0, HER2 Low/Negative, ER Negative, Chemotherapy, HER2 Low, Second Line, PD-L1 Expression  Negative/Unknown or Not a Candidate for Immunotherapy, and gBRCA Wildtype Therapeutic Status: Distant Metastases HER2 Status: Low ER Status: Negative (-) PR Status: Negative (-) Therapy Approach Indicated: Standard Chemotherapy/Endocrine Therapy Line of Therapy: Second Line Intent of Therapy: Non-Curative / Palliative Intent, Discussed with Patient

## 2023-06-06 NOTE — Progress Notes (Signed)
 Per MD and treatment team to hold treatment today and proceed with Magnesium  4g IV at this time.  Orders placed. Pt updated and agrees to plan.   Patient tolerated Magnesium  well with no signs of complications. VSS. Pt stable at discharge. Pt left in satisfactory condition. All questions answered at this time.   Monica Soto [employer]

## 2023-06-06 NOTE — Patient Instructions (Addendum)
 [facility] at [facility] Discharge Instructions   You were seen and examined today by Dr. Cheree Cords.  He reviewed the results of your lab work which are normal/stable.   He reviewed the results of your PET scan which shows the cancer has gotten worse. We will need to switch treatments. We will switch you to a medication called Trodelvy. It is given 2 weeks in a row with one week off. The day after the second treatment you will receive a white blood cell booster shot. We will get your treatment scheduled for this coming Monday.    Return as scheduled.    Thank you for choosing [facility] at [facility] to provide your oncology and hematology care.  To afford each patient quality time with our provider, please arrive at least 15 minutes before your scheduled appointment time.   If you have a lab appointment with the Cancer Center please come in thru the Main Entrance and check in at the main information desk.  You need to re-schedule your appointment should you arrive 10 or more minutes late.  We strive to give you quality time with our providers, and arriving late affects you and other patients whose appointments are after yours.  Also, if you no show three or more times for appointments you may be dismissed from the clinic at the providers discretion.     Again, thank you for choosing [facility].  Our hope is that these requests will decrease the amount of time that you wait before being seen by our physicians.       _____________________________________________________________  Should you have questions after your visit to [facility], please contact our office at [phone] and follow the prompts.  Our office hours are 8:00 a.m. and 4:30 p.m. Monday - Friday.  Please note that voicemails left after 4:00 p.m. may not be returned until the following business day.  We are closed weekends and major holidays.  You do  have access to a nurse 24-7, just call the main number to the clinic [phone] and do not press any options, hold on the line and a nurse will answer the phone.    For prescription refill requests, have your pharmacy contact our office and allow 72 hours.    Due to Covid, you will need to wear a mask upon entering the hospital. If you do not have a mask, a mask will be given to you at the Main Entrance upon arrival. For doctor visits, patients may have 1 support person age 10 or older with them. For treatment visits, patients can not have anyone with them due to social distancing guidelines and our immunocompromised population.

## 2023-06-07 ENCOUNTER — Other Ambulatory Visit: Payer: Self-pay

## 2023-06-07 LAB — CANCER ANTIGEN 27.29: CA 27.29: 985.6 U/mL — ABNORMAL HIGH (ref 0.0–38.6)

## 2023-06-07 LAB — CANCER ANTIGEN 15-3: CA 15-3: 799 U/mL — ABNORMAL HIGH (ref 0.0–25.0)

## 2023-06-07 NOTE — Progress Notes (Signed)
 Orders received from MD to add Udenyca 6 mg subcutaneous to day 10 of chemotherapy plan  Discontinue diphenhydramine from oncology treatment plan --> Add Quzyttir  (cetirizine ) 10 mg IVPush x 1 as premedication for oncology treatment plan.  T.O. Dr Davina Ester, PharmD   Pharmacist Chemotherapy Monitoring - Initial Assessment    Anticipated start date: 06/07/23   The following has been reviewed per standard work regarding the patient's treatment regimen: The patient's diagnosis, treatment plan and drug doses, and organ/hematologic function Lab orders and baseline tests specific to treatment regimen  The treatment plan start date, drug sequencing, and pre-medications Prior authorization status  Patient's documented medication list, including drug-drug interaction screen and prescriptions for anti-emetics and supportive care specific to the treatment regimen The drug concentrations, fluid compatibility, administration routes, and timing of the medications to be used The patient's access for treatment and lifetime cumulative dose history, if applicable  The patient's medication allergies and previous infusion related reactions, if applicable   Changes made to treatment plan:  pre-medications    Follow up needed:  Pending authorization for treatment for Tereso Fenton, [facility], 06/07/2023  2:21 PM

## 2023-06-10 ENCOUNTER — Inpatient Hospital Stay

## 2023-06-10 VITALS — BP 124/82 | HR 79 | Temp 99.0°F | Resp 19 | Wt 161.1 lb

## 2023-06-10 DIAGNOSIS — Z5112 Encounter for antineoplastic immunotherapy: Secondary | ICD-10-CM | POA: Diagnosis not present

## 2023-06-10 DIAGNOSIS — Z170 Estrogen receptor positive status [ER+]: Secondary | ICD-10-CM

## 2023-06-10 LAB — COMPREHENSIVE METABOLIC PANEL WITH GFR
ALT: 25 U/L (ref 0–44)
AST: 56 U/L — ABNORMAL HIGH (ref 15–41)
Albumin: 3 g/dL — ABNORMAL LOW (ref 3.5–5.0)
Alkaline Phosphatase: 206 U/L — ABNORMAL HIGH (ref 38–126)
Anion gap: 13 (ref 5–15)
BUN: 9 mg/dL (ref 6–20)
CO2: 19 mmol/L — ABNORMAL LOW (ref 22–32)
Calcium: 8.2 mg/dL — ABNORMAL LOW (ref 8.9–10.3)
Chloride: 102 mmol/L (ref 98–111)
Creatinine, Ser: 0.72 mg/dL (ref 0.44–1.00)
GFR, Estimated: >60 mL/min (ref >60)
Glucose, Bld: 159 mg/dL — ABNORMAL HIGH (ref 70–99)
Potassium: 3.1 mmol/L — ABNORMAL LOW (ref 3.5–5.1)
Sodium: 134 mmol/L — ABNORMAL LOW (ref 135–145)
Total Bilirubin: 0.5 mg/dL (ref 0.0–1.2)
Total Protein: 6.7 g/dL (ref 6.5–8.1)

## 2023-06-10 LAB — CBC WITH DIFFERENTIAL/PLATELET
Abs Immature Granulocytes: 0.11 10*3/uL — ABNORMAL HIGH (ref 0.00–0.07)
Basophils Absolute: 0 10*3/uL (ref 0.0–0.1)
Basophils Relative: 1 %
Eosinophils Absolute: 0 10*3/uL (ref 0.0–0.5)
Eosinophils Relative: 0 %
HCT: 32.9 % — ABNORMAL LOW (ref 36.0–46.0)
Hemoglobin: 10.6 g/dL — ABNORMAL LOW (ref 12.0–15.0)
Immature Granulocytes: 2 %
Lymphocytes Relative: 16 %
Lymphs Abs: 1 10*3/uL (ref 0.7–4.0)
MCH: 32.1 pg (ref 26.0–34.0)
MCHC: 32.2 g/dL (ref 30.0–36.0)
MCV: 99.7 fL (ref 80.0–100.0)
Monocytes Absolute: 0.5 10*3/uL (ref 0.1–1.0)
Monocytes Relative: 9 %
Neutro Abs: 4.2 10*3/uL (ref 1.7–7.7)
Neutrophils Relative %: 72 %
Platelets: 158 10*3/uL (ref 150–400)
RBC: 3.3 MIL/uL — ABNORMAL LOW (ref 3.87–5.11)
RDW: 18.6 % — ABNORMAL HIGH (ref 11.5–15.5)
WBC: 5.8 10*3/uL (ref 4.0–10.5)
nRBC: 0.3 % — ABNORMAL HIGH (ref 0.0–0.2)

## 2023-06-10 LAB — PHOSPHORUS: Phosphorus: 2.9 mg/dL (ref 2.5–4.6)

## 2023-06-10 LAB — MAGNESIUM: Magnesium: 1.5 mg/dL — ABNORMAL LOW (ref 1.7–2.4)

## 2023-06-10 MED ORDER — IBUPROFEN 200 MG PO TABS
800.0000 mg | ORAL_TABLET | Freq: Once | ORAL | Status: AC
  Administered 2023-06-10: 800 mg via ORAL
  Filled 2023-06-10: qty 4

## 2023-06-10 MED ORDER — MAGNESIUM SULFATE 2 GM/50ML IV SOLN
2.0000 g | Freq: Once | INTRAVENOUS | Status: AC
  Administered 2023-06-10: 2 g via INTRAVENOUS
  Filled 2023-06-10: qty 50

## 2023-06-10 MED ORDER — HEPARIN SOD (PORK) LOCK FLUSH 100 UNIT/ML IV SOLN
500.0000 [IU] | Freq: Once | INTRAVENOUS | Status: AC | PRN
Start: 2023-06-10 — End: 2023-06-10
  Administered 2023-06-10: 500 [IU]

## 2023-06-10 MED ORDER — ATROPINE SULFATE 1 MG/ML IV SOLN: 0.5000 mg | Freq: Once | INTRAVENOUS | Status: DC | PRN

## 2023-06-10 MED ORDER — ACETAMINOPHEN 325 MG PO TABS: 650.0000 mg | ORAL_TABLET | Freq: Once | ORAL | Status: DC

## 2023-06-10 MED ORDER — SODIUM CHLORIDE 0.9 % IV SOLN
150.0000 mg | Freq: Once | INTRAVENOUS | Status: AC
  Administered 2023-06-10: 150 mg via INTRAVENOUS
  Filled 2023-06-10: qty 150

## 2023-06-10 MED ORDER — FAMOTIDINE IN NACL 20-0.9 MG/50ML-% IV SOLN
20.0000 mg | Freq: Once | INTRAVENOUS | Status: AC
  Administered 2023-06-10: 20 mg via INTRAVENOUS
  Filled 2023-06-10: qty 50

## 2023-06-10 MED ORDER — POTASSIUM CHLORIDE CRYS ER 20 MEQ PO TBCR
40.0000 meq | EXTENDED_RELEASE_TABLET | Freq: Once | ORAL | Status: AC
  Administered 2023-06-10: 40 meq via ORAL
  Filled 2023-06-10: qty 2

## 2023-06-10 MED ORDER — PALONOSETRON HCL INJECTION 0.25 MG/5ML
0.2500 mg | Freq: Once | INTRAVENOUS | Status: AC
  Administered 2023-06-10: 0.25 mg via INTRAVENOUS
  Filled 2023-06-10: qty 5

## 2023-06-10 MED ORDER — CETIRIZINE HCL 10 MG/ML IV SOLN
10.0000 mg | Freq: Once | INTRAVENOUS | Status: AC
  Administered 2023-06-10: 10 mg via INTRAVENOUS
  Filled 2023-06-10: qty 1

## 2023-06-10 MED ORDER — SACITUZUMAB GOVITECAN-HZIY CHEMO 180 MG IV SOLR
10.0000 mg/kg | Freq: Once | INTRAVENOUS | Status: AC
  Administered 2023-06-10: 720 mg via INTRAVENOUS
  Filled 2023-06-10: qty 72

## 2023-06-10 MED ORDER — SODIUM CHLORIDE 0.9 % IV SOLN: INTRAVENOUS | Status: DC

## 2023-06-10 MED ORDER — DEXAMETHASONE SODIUM PHOSPHATE 10 MG/ML IJ SOLN
10.0000 mg | Freq: Once | INTRAMUSCULAR | Status: AC
  Administered 2023-06-10: 10 mg via INTRAVENOUS
  Filled 2023-06-10: qty 1

## 2023-06-10 NOTE — Progress Notes (Signed)
 MD and treatment team made aware of pt.'s Mag 1.5. Per MD to proceed with treatment and administered Magnesium  2g IV.    Patient.'s potassium 3.1. pt received 40 mEq potassium PO per standing orders.  Per patient she is having right leg pain that alternates to her left leg at times and that she took tylenol  at 0600 this morning prior to appointment. No swelling, no redness, and no tender areas on patient's right leg. MD and treatment team updated. Per MD to give ibuprofen  800 mg PO at this time and hold tylenol  650 mg PO as a premedication at this time. Pt updated and agrees to plan.   Patient tolerated chemotherapy with no complaints voiced.  Side effects with management reviewed with understanding verbalized.  Port site clean and dry with no bruising or swelling noted at site.  Good blood return noted before and after administration of chemotherapy.  Band aid applied.  Patient left in satisfactory condition with VSS and no s/s of distress noted. All follow ups as scheduled.   Phelan Schadt [employer]

## 2023-06-10 NOTE — Patient Instructions (Signed)
 [facility] [place] - A DEPT OF [place]  Discharge Instructions: Thank you for choosing [facility] to provide your oncology and hematology care.  If you have a lab appointment with the Cancer Center - please note that after April 8th, 2024, all labs will be drawn in the cancer center.  You do not have to check in or register with the main entrance as you have in the past but will complete your check-in in the cancer center.  Wear comfortable clothing and clothing appropriate for easy access to any Portacath or PICC line.   We strive to give you quality time with your provider. You may need to reschedule your appointment if you arrive late (15 or more minutes).  Arriving late affects you and other patients whose appointments are after yours.  Also, if you miss three or more appointments without notifying the office, you may be dismissed from the clinic at the provider's discretion.      For prescription refill requests, have your pharmacy contact our office and allow 72 hours for refills to be completed.    Today you received the following chemotherapy and/or immunotherapy agents Chaya Cord   To help prevent nausea and vomiting after your treatment, we encourage you to take your nausea medication as directed.  BELOW ARE SYMPTOMS THAT SHOULD BE REPORTED IMMEDIATELY: *FEVER GREATER THAN 100.4 F (38 C) OR HIGHER *CHILLS OR SWEATING *NAUSEA AND VOMITING THAT IS NOT CONTROLLED WITH YOUR NAUSEA MEDICATION *UNUSUAL SHORTNESS OF BREATH *UNUSUAL BRUISING OR BLEEDING *URINARY PROBLEMS (pain or burning when urinating, or frequent urination) *BOWEL PROBLEMS (unusual diarrhea, constipation, pain near the anus) TENDERNESS IN MOUTH AND THROAT WITH OR WITHOUT PRESENCE OF ULCERS (sore throat, sores in mouth, or a toothache) UNUSUAL RASH, SWELLING OR PAIN  UNUSUAL VAGINAL DISCHARGE OR ITCHING   Items with * indicate a potential emergency and should be followed up as  soon as possible or go to the Emergency Department if any problems should occur.  Please show the CHEMOTHERAPY ALERT CARD or IMMUNOTHERAPY ALERT CARD at check-in to the Emergency Department and triage nurse.  Should you have questions after your visit or need to cancel or reschedule your appointment, please contact [facility] [place] - A DEPT OF [facility] [phone]  and follow the prompts.  Office hours are 8:00 a.m. to 4:30 p.m. Monday - Friday. Please note that voicemails left after 4:00 p.m. may not be returned until the following business day.  We are closed weekends and major holidays. You have access to a nurse at all times for urgent questions. Please call the main number to the clinic [phone] and follow the prompts.  For any non-urgent questions, you may also contact your provider using MyChart. We now offer e-Visits for anyone 15 and older to request care online for non-urgent symptoms. For details visit [email or web address].   Also download the MyChart app! Go to the app store, search "MyChart", open the app, select [facility], and log in with your MyChart username and password.

## 2023-06-11 ENCOUNTER — Telehealth: Payer: Self-pay

## 2023-06-11 NOTE — Telephone Encounter (Signed)
 Notified Patient of completion of Disability Forms. Fax transmission confirmation received. Copy of forms emailed to Patient as requested. No other needs or concerns noted at this time.

## 2023-06-13 ENCOUNTER — Encounter: Payer: Self-pay | Admitting: Hematology

## 2023-06-13 ENCOUNTER — Inpatient Hospital Stay

## 2023-06-13 ENCOUNTER — Encounter: Payer: Self-pay | Admitting: Hematology and Oncology

## 2023-06-17 ENCOUNTER — Inpatient Hospital Stay

## 2023-06-17 ENCOUNTER — Encounter: Payer: Self-pay | Admitting: Hematology and Oncology

## 2023-06-17 VITALS — BP 138/70 | HR 95 | Temp 97.4°F | Resp 18 | Wt 157.5 lb

## 2023-06-17 DIAGNOSIS — D702 Other drug-induced agranulocytosis: Secondary | ICD-10-CM | POA: Insufficient documentation

## 2023-06-17 DIAGNOSIS — Z5112 Encounter for antineoplastic immunotherapy: Secondary | ICD-10-CM | POA: Diagnosis not present

## 2023-06-17 DIAGNOSIS — Z170 Estrogen receptor positive status [ER+]: Secondary | ICD-10-CM

## 2023-06-17 DIAGNOSIS — E876 Hypokalemia: Secondary | ICD-10-CM

## 2023-06-17 DIAGNOSIS — Z95828 Presence of other vascular implants and grafts: Secondary | ICD-10-CM

## 2023-06-17 LAB — CBC WITH DIFFERENTIAL/PLATELET
Abs Immature Granulocytes: 0 10*3/uL (ref 0.00–0.07)
Basophils Absolute: 0 10*3/uL (ref 0.0–0.1)
Basophils Relative: 2 %
Eosinophils Absolute: 0.1 10*3/uL (ref 0.0–0.5)
Eosinophils Relative: 4 %
HCT: 31.4 % — ABNORMAL LOW (ref 36.0–46.0)
Hemoglobin: 9.8 g/dL — ABNORMAL LOW (ref 12.0–15.0)
Lymphocytes Relative: 57 %
Lymphs Abs: 1.1 10*3/uL (ref 0.7–4.0)
MCH: 30.8 pg (ref 26.0–34.0)
MCHC: 31.2 g/dL (ref 30.0–36.0)
MCV: 98.7 fL (ref 80.0–100.0)
Monocytes Absolute: 0.1 10*3/uL (ref 0.1–1.0)
Monocytes Relative: 7 %
Neutro Abs: 0.6 10*3/uL — ABNORMAL LOW (ref 1.7–7.7)
Neutrophils Relative %: 30 %
Platelets: 116 10*3/uL — ABNORMAL LOW (ref 150–400)
RBC: 3.18 MIL/uL — ABNORMAL LOW (ref 3.87–5.11)
RDW: 18.3 % — ABNORMAL HIGH (ref 11.5–15.5)
WBC: 2 10*3/uL — ABNORMAL LOW (ref 4.0–10.5)
nRBC: 0 % (ref 0.0–0.2)
nRBC: 1 /100{WBCs} — ABNORMAL HIGH

## 2023-06-17 LAB — COMPREHENSIVE METABOLIC PANEL WITH GFR
ALT: 20 U/L (ref 0–44)
AST: 35 U/L (ref 15–41)
Albumin: 3 g/dL — ABNORMAL LOW (ref 3.5–5.0)
Alkaline Phosphatase: 154 U/L — ABNORMAL HIGH (ref 38–126)
Anion gap: 11 (ref 5–15)
BUN: 8 mg/dL (ref 6–20)
CO2: 21 mmol/L — ABNORMAL LOW (ref 22–32)
Calcium: 8.5 mg/dL — ABNORMAL LOW (ref 8.9–10.3)
Chloride: 102 mmol/L (ref 98–111)
Creatinine, Ser: 0.65 mg/dL (ref 0.44–1.00)
GFR, Estimated: >60 mL/min (ref >60)
Glucose, Bld: 135 mg/dL — ABNORMAL HIGH (ref 70–99)
Potassium: 3.3 mmol/L — ABNORMAL LOW (ref 3.5–5.1)
Sodium: 134 mmol/L — ABNORMAL LOW (ref 135–145)
Total Bilirubin: 0.3 mg/dL (ref 0.0–1.2)
Total Protein: 7 g/dL (ref 6.5–8.1)

## 2023-06-17 LAB — PHOSPHORUS: Phosphorus: 3.4 mg/dL (ref 2.5–4.6)

## 2023-06-17 LAB — MAGNESIUM: Magnesium: 1.7 mg/dL (ref 1.7–2.4)

## 2023-06-17 MED ORDER — FILGRASTIM-SNDZ 480 MCG/0.8ML IJ SOSY
480.0000 ug | PREFILLED_SYRINGE | Freq: Once | INTRAMUSCULAR | Status: AC
  Administered 2023-06-17: 480 ug via SUBCUTANEOUS
  Filled 2023-06-17: qty 0.8

## 2023-06-17 MED ORDER — HEPARIN SOD (PORK) LOCK FLUSH 100 UNIT/ML IV SOLN
500.0000 [IU] | Freq: Once | INTRAVENOUS | Status: AC
  Administered 2023-06-17: 500 [IU] via INTRAVENOUS

## 2023-06-17 MED ORDER — SODIUM CHLORIDE 0.9% FLUSH
10.0000 mL | INTRAVENOUS | Status: DC | PRN
  Administered 2023-06-17: 10 mL via INTRAVENOUS

## 2023-06-17 MED ORDER — POTASSIUM CHLORIDE CRYS ER 20 MEQ PO TBCR
40.0000 meq | EXTENDED_RELEASE_TABLET | Freq: Once | ORAL | Status: AC
  Administered 2023-06-17: 40 meq via ORAL
  Filled 2023-06-17: qty 2

## 2023-06-17 MED ORDER — AMOXICILLIN-POT CLAVULANATE 875-125 MG PO TABS: 1.0000 | ORAL_TABLET | Freq: Two times a day (BID) | ORAL | 0 refills | Status: DC

## 2023-06-17 NOTE — Progress Notes (Signed)
 Patients port flushed without difficulty.  Good blood return noted with no bruising or swelling noted at site.  Patient remains accessed for treatment.

## 2023-06-17 NOTE — Progress Notes (Signed)
 ANC 0.6 today.  Oncologist aware.   Hold day 8 today and Give G-CSF 480 mcg today and recheck CBCD tomorrow. May give day 8 dose at 7.5 mg/kg tomorrow if ANC is more than 1000 verbal order Dr. Cheree Cords.   Augmentin  prescription sent to [facility] for drainage per Dr. Cheree Cords.  Patient aware.   Patients port deaccessed with no complaints voiced.  Site clean and dry with blood return noted.  Band aid applied.    Patient tolerated injection with no complaints voiced.  Site clean and dry with no bruising or swelling noted at site.  See MAR for details.  Band aid applied.  Patient stable during and after injection.  Vss with discharge and left in satisfactory condition with no s/s of distress noted.

## 2023-06-17 NOTE — Patient Instructions (Signed)
 [facility] [place] - A DEPT OF [place]  Discharge Instructions: Thank you for choosing [facility] to provide your oncology and hematology care.  If you have a lab appointment with the Cancer Center - please note that after April 8th, 2024, all labs will be drawn in the cancer center.  You do not have to check in or register with the main entrance as you have in the past but will complete your check-in in the cancer center.  Wear comfortable clothing and clothing appropriate for easy access to any Portacath or PICC line.   We strive to give you quality time with your provider. You may need to reschedule your appointment if you arrive late (15 or more minutes).  Arriving late affects you and other patients whose appointments are after yours.  Also, if you miss three or more appointments without notifying the office, you may be dismissed from the clinic at the provider's discretion.      For prescription refill requests, have your pharmacy contact our office and allow 72 hours for refills to be completed.    Today you received the following chemotherapy and/or immunotherapy agents Filgrastim Injection What is this medication? FILGRASTIM (fil GRA stim) lowers the risk of infection in people who are receiving chemotherapy. It works by Systems analyst make more white blood cells, which protects your body from infection. It may also be used to help people who have been exposed to high doses of radiation. It can be used to help prepare your body before a stem cell transplant. It works by helping your bone marrow make and release stem cells into the blood. This medicine may be used for other purposes; ask your health care provider or pharmacist if you have questions. COMMON BRAND NAME(S): Neupogen, Nivestym, Nypozi, Releuko, Zarxio What should I tell my care team before I take this medication? They need to know if you have any of these conditions: History of  blood diseases, such as sickle cell anemia Kidney disease Recent or ongoing radiation An unusual or allergic reaction to filgrastim, pegfilgrastim, latex, rubber, other medications, foods, dyes, or preservatives Pregnant or trying to get pregnant Breast-feeding How should I use this medication? This medication is injected under the skin or into a vein. It is usually given by your care team in a hospital or clinic setting. It may be given at home. If you get this medication at home, you will be taught how to prepare and give it. Use exactly as directed. Take it as directed on the prescription label at the same time every day. Keep taking it unless your care team tells you to stop. It is important that you put your used needles and syringes in a special sharps container. Do not put them in a trash can. If you do not have a sharps container, call your pharmacist or care team to get one. This medication comes with INSTRUCTIONS FOR USE. Ask your pharmacist for directions on how to use this medication. Read the information carefully. Talk to your pharmacist or care team if you have questions. Talk to your care team about the use of this medication in children. While it may be prescribed for children for selected conditions, precautions do apply. Overdosage: If you think you have taken too much of this medicine contact a poison control center or emergency room at once. NOTE: This medicine is only for you. Do not share this medicine with others. What if I  miss a dose? It is important not to miss any doses. Talk to your care team about what to do if you miss a dose. What may interact with this medication? Medications that may cause a release of neutrophils, such as lithium This list may not describe all possible interactions. Give your health care provider a list of all the medicines, herbs, non-prescription drugs, or dietary supplements you use. Also tell them if you smoke, drink alcohol, or use illegal  drugs. Some items may interact with your medicine. What should I watch for while using this medication? Your condition will be monitored carefully while you are receiving this medication. You may need bloodwork while taking this medication. Talk to your care team about your risk of cancer. You may be more at risk for certain types of cancer if you take this medication. What side effects may I notice from receiving this medication? Side effects that you should report to your care team as soon as possible: Allergic reactions--skin rash, itching, hives, swelling of the face, lips, tongue, or throat Capillary leak syndrome--stomach or muscle pain, unusual weakness or fatigue, feeling faint or lightheaded, decrease in the amount of urine, swelling of the ankles, hands, or feet, trouble breathing High white blood cell level--fever, fatigue, trouble breathing, night sweats, change in vision, weight loss Inflammation of the aorta--fever, fatigue, back, chest, or stomach pain, severe headache Kidney injury (glomerulonephritis)--decrease in the amount of urine, red or dark brown urine, foamy or bubbly urine, swelling of the ankles, hands, or feet Shortness of breath or trouble breathing Spleen injury--pain in upper left stomach or shoulder Unusual bruising or bleeding Side effects that usually do not require medical attention (report to your care team if they continue or are bothersome): Back pain Bone pain Fatigue Fever Headache Nausea This list may not describe all possible side effects. Call your doctor for medical advice about side effects. You may report side effects to FDA at 1-800-FDA-1088. Where should I keep my medication? Keep out of the reach of children and pets. Keep this medication in the original packaging until you are ready to take it. Protect from light. See product for storage information. Each product may have different instructions. Get rid of any unused medication after the  expiration date. To get rid of medications that are no longer needed or have expired: Take the medication to a medications take-back program. Check with your pharmacy or law enforcement to find a location. If you cannot return the medication, ask your pharmacist or care team how to get rid of this medication safely. NOTE: This sheet is a summary. It may not cover all possible information. If you have questions about this medicine, talk to your doctor, pharmacist, or health care provider.  2024 Elsevier/Gold Standard (2021-06-15 00:00:00)        To help prevent nausea and vomiting after your treatment, we encourage you to take your nausea medication as directed.  BELOW ARE SYMPTOMS THAT SHOULD BE REPORTED IMMEDIATELY: *FEVER GREATER THAN 100.4 F (38 C) OR HIGHER *CHILLS OR SWEATING *NAUSEA AND VOMITING THAT IS NOT CONTROLLED WITH YOUR NAUSEA MEDICATION *UNUSUAL SHORTNESS OF BREATH *UNUSUAL BRUISING OR BLEEDING *URINARY PROBLEMS (pain or burning when urinating, or frequent urination) *BOWEL PROBLEMS (unusual diarrhea, constipation, pain near the anus) TENDERNESS IN MOUTH AND THROAT WITH OR WITHOUT PRESENCE OF ULCERS (sore throat, sores in mouth, or a toothache) UNUSUAL RASH, SWELLING OR PAIN  UNUSUAL VAGINAL DISCHARGE OR ITCHING   Items with * indicate a potential emergency and  should be followed up as soon as possible or go to the Emergency Department if any problems should occur.  Please show the CHEMOTHERAPY ALERT CARD or IMMUNOTHERAPY ALERT CARD at check-in to the Emergency Department and triage nurse.  Should you have questions after your visit or need to cancel or reschedule your appointment, please contact [facility] [place] - A DEPT OF [facility] [phone]  and follow the prompts.  Office hours are 8:00 a.m. to 4:30 p.m. Monday - Friday. Please note that voicemails left after 4:00 p.m. may not be returned until the following business day.  We are closed  weekends and major holidays. You have access to a nurse at all times for urgent questions. Please call the main number to the clinic [phone] and follow the prompts.  For any non-urgent questions, you may also contact your provider using MyChart. We now offer e-Visits for anyone 78 and older to request care online for non-urgent symptoms. For details visit [email or web address].   Also download the MyChart app! Go to the app store, search "MyChart", open the app, select [facility], and log in with your MyChart username and password.

## 2023-06-18 ENCOUNTER — Inpatient Hospital Stay

## 2023-06-18 ENCOUNTER — Other Ambulatory Visit: Payer: Self-pay

## 2023-06-18 DIAGNOSIS — Z170 Estrogen receptor positive status [ER+]: Secondary | ICD-10-CM

## 2023-06-19 ENCOUNTER — Inpatient Hospital Stay

## 2023-06-19 ENCOUNTER — Ambulatory Visit

## 2023-06-19 VITALS — BP 119/69 | HR 80 | Temp 98.0°F | Resp 18

## 2023-06-19 DIAGNOSIS — Z95828 Presence of other vascular implants and grafts: Secondary | ICD-10-CM

## 2023-06-19 DIAGNOSIS — C50411 Malignant neoplasm of upper-outer quadrant of right female breast: Secondary | ICD-10-CM

## 2023-06-19 DIAGNOSIS — Z5112 Encounter for antineoplastic immunotherapy: Secondary | ICD-10-CM | POA: Diagnosis not present

## 2023-06-19 LAB — COMPREHENSIVE METABOLIC PANEL WITH GFR
ALT: 18 U/L (ref 0–44)
AST: 30 U/L (ref 15–41)
Albumin: 2.9 g/dL — ABNORMAL LOW (ref 3.5–5.0)
Alkaline Phosphatase: 137 U/L — ABNORMAL HIGH (ref 38–126)
Anion gap: 10 (ref 5–15)
BUN: 7 mg/dL (ref 6–20)
CO2: 20 mmol/L — ABNORMAL LOW (ref 22–32)
Calcium: 8.4 mg/dL — ABNORMAL LOW (ref 8.9–10.3)
Chloride: 108 mmol/L (ref 98–111)
Creatinine, Ser: 0.77 mg/dL (ref 0.44–1.00)
GFR, Estimated: >60 mL/min (ref >60)
Glucose, Bld: 157 mg/dL — ABNORMAL HIGH (ref 70–99)
Potassium: 3.4 mmol/L — ABNORMAL LOW (ref 3.5–5.1)
Sodium: 138 mmol/L (ref 135–145)
Total Bilirubin: 0.3 mg/dL (ref 0.0–1.2)
Total Protein: 6.6 g/dL (ref 6.5–8.1)

## 2023-06-19 LAB — CBC WITH DIFFERENTIAL/PLATELET
Abs Immature Granulocytes: 0.27 10*3/uL — ABNORMAL HIGH (ref 0.00–0.07)
Basophils Absolute: 0 10*3/uL (ref 0.0–0.1)
Basophils Relative: 1 %
Eosinophils Absolute: 0 10*3/uL (ref 0.0–0.5)
Eosinophils Relative: 1 %
HCT: 30.5 % — ABNORMAL LOW (ref 36.0–46.0)
Hemoglobin: 9.6 g/dL — ABNORMAL LOW (ref 12.0–15.0)
Immature Granulocytes: 4 %
Lymphocytes Relative: 21 %
Lymphs Abs: 1.3 10*3/uL (ref 0.7–4.0)
MCH: 31.3 pg (ref 26.0–34.0)
MCHC: 31.5 g/dL (ref 30.0–36.0)
MCV: 99.3 fL (ref 80.0–100.0)
Monocytes Absolute: 0.4 10*3/uL (ref 0.1–1.0)
Monocytes Relative: 7 %
Neutro Abs: 4.1 10*3/uL (ref 1.7–7.7)
Neutrophils Relative %: 66 %
Platelets: 134 10*3/uL — ABNORMAL LOW (ref 150–400)
RBC: 3.07 MIL/uL — ABNORMAL LOW (ref 3.87–5.11)
RDW: 18.6 % — ABNORMAL HIGH (ref 11.5–15.5)
WBC: 6.2 10*3/uL (ref 4.0–10.5)
nRBC: 0.8 % — ABNORMAL HIGH (ref 0.0–0.2)

## 2023-06-19 LAB — PHOSPHORUS: Phosphorus: 3.4 mg/dL (ref 2.5–4.6)

## 2023-06-19 LAB — MAGNESIUM: Magnesium: 1.7 mg/dL (ref 1.7–2.4)

## 2023-06-19 MED ORDER — CETIRIZINE HCL 10 MG/ML IV SOLN
10.0000 mg | Freq: Once | INTRAVENOUS | Status: AC
  Administered 2023-06-19: 10 mg via INTRAVENOUS
  Filled 2023-06-19: qty 1

## 2023-06-19 MED ORDER — ACETAMINOPHEN 325 MG PO TABS
650.0000 mg | ORAL_TABLET | Freq: Once | ORAL | Status: AC
  Administered 2023-06-19: 650 mg via ORAL
  Filled 2023-06-19: qty 2

## 2023-06-19 MED ORDER — SODIUM CHLORIDE 0.9% FLUSH
10.0000 mL | INTRAVENOUS | Status: DC | PRN
  Administered 2023-06-19: 10 mL

## 2023-06-19 MED ORDER — SODIUM CHLORIDE 0.9 % IV SOLN
INTRAVENOUS | Status: DC
Start: 2023-06-19 — End: 2023-06-19

## 2023-06-19 MED ORDER — SODIUM CHLORIDE 0.9 % IV SOLN
150.0000 mg | Freq: Once | INTRAVENOUS | Status: AC
  Administered 2023-06-19: 150 mg via INTRAVENOUS
  Filled 2023-06-19: qty 5

## 2023-06-19 MED ORDER — FAMOTIDINE IN NACL 20-0.9 MG/50ML-% IV SOLN
20.0000 mg | Freq: Once | INTRAVENOUS | Status: AC
  Administered 2023-06-19: 20 mg via INTRAVENOUS
  Filled 2023-06-19: qty 50

## 2023-06-19 MED ORDER — SODIUM CHLORIDE 0.9% FLUSH
10.0000 mL | Freq: Once | INTRAVENOUS | Status: AC
  Administered 2023-06-19: 10 mL via INTRAVENOUS

## 2023-06-19 MED ORDER — PALONOSETRON HCL INJECTION 0.25 MG/5ML
0.2500 mg | Freq: Once | INTRAVENOUS | Status: AC
  Administered 2023-06-19: 0.25 mg via INTRAVENOUS
  Filled 2023-06-19: qty 5

## 2023-06-19 MED ORDER — HEPARIN SOD (PORK) LOCK FLUSH 100 UNIT/ML IV SOLN
500.0000 [IU] | Freq: Once | INTRAVENOUS | Status: AC | PRN
  Administered 2023-06-19: 500 [IU]

## 2023-06-19 MED ORDER — SODIUM CHLORIDE 0.9 % IV SOLN
7.5000 mg/kg | Freq: Once | INTRAVENOUS | Status: AC
  Administered 2023-06-19: 540 mg via INTRAVENOUS
  Filled 2023-06-19: qty 54

## 2023-06-19 MED ORDER — DEXAMETHASONE SODIUM PHOSPHATE 10 MG/ML IJ SOLN
10.0000 mg | Freq: Once | INTRAMUSCULAR | Status: AC
  Administered 2023-06-19: 10 mg via INTRAVENOUS
  Filled 2023-06-19: qty 1

## 2023-06-19 NOTE — Progress Notes (Signed)
 Orders received:  Decrease dose of Trodelvy  to 7.5 mg/kg Add pegfilgrastim 6 mg subcutaneous x 1 to day 9 of chemotherapy   Ok to use CMP from 06/17/23  Discontinue diphenhydramine from oncology treatment plan --> Add Quzyttir  (cetirizine ) 10 mg IVPush x 1 as premedication for oncology treatment plan.   V.O. Dr Davina Ester, PharmD

## 2023-06-19 NOTE — Progress Notes (Signed)
 Patient presents today for Trodelvy  infusion.  Patient is in satisfactory condition with no new complaints voiced.  Vital signs are stable.  Labs reviewed 06/17/23  and all labs are within treatment parameters.  We will proceed with treatment per MD orders.    Treatment given today per MD orders. Tolerated infusion without adverse affects. Vital signs stable. No complaints at this time. Discharged from clinic ambulatory in stable condition. Alert and oriented x 3. F/U with [facility] as scheduled.

## 2023-06-19 NOTE — Patient Instructions (Signed)
 [facility] [place] - A DEPT OF [place]  Discharge Instructions: Thank you for choosing [facility] to provide your oncology and hematology care.  If you have a lab appointment with the Cancer Center - please note that after April 8th, 2024, all labs will be drawn in the cancer center.  You do not have to check in or register with the main entrance as you have in the past but will complete your check-in in the cancer center.  Wear comfortable clothing and clothing appropriate for easy access to any Portacath or PICC line.   We strive to give you quality time with your provider. You may need to reschedule your appointment if you arrive late (15 or more minutes).  Arriving late affects you and other patients whose appointments are after yours.  Also, if you miss three or more appointments without notifying the office, you may be dismissed from the clinic at the provider's discretion.      For prescription refill requests, have your pharmacy contact our office and allow 72 hours for refills to be completed.    Today you received the following chemotherapy and/or immunotherapy agents Trodelvy    To help prevent nausea and vomiting after your treatment, we encourage you to take your nausea medication as directed.  Sacituzumab Govitecan  Injection What is this medication? SACITUZUMAB GOVETECAN (SAK i TOOZ ue mab GOE vi TEE can) treats breast cancer. It works by blocking a protein that causes cancer cells to grow and multiply. This helps to slow or stop the spread of cancer cells. This medicine may be used for other purposes; ask your health care provider or pharmacist if you have questions. COMMON BRAND NAME(S): TRODELVY  What should I tell my care team before I take this medication? They need to know if you have any of these conditions: Carry the UGT1A1*28 gene Infection Liver disease An unusual or allergic reaction to sacituzumab govitecan , other  medications, foods, dyes, or preservatives Pregnant or trying to get pregnant Breast-feeding How should I use this medication? This medication is injected into a vein. It is given by your care team in a hospital or clinic setting. Talk to your care team about the use of this medication in children. Special care may be needed. Overdosage: If you think you have taken too much of this medicine contact a poison control center or emergency room at once. NOTE: This medicine is only for you. Do not share this medicine with others. What if I miss a dose? Keep appointments for follow-up doses. It is important not to miss your dose. Call your care team if you are unable to keep an appointment. What may interact with this medication? This medication may affect how other medications work, and other medications may affect the way this medication works. Talk with your care team about all of the medications you take. They may suggest changes to your treatment plan to lower the risk of side effects and to make sure your medications work as intended. This list may not describe all possible interactions. Give your health care provider a list of all the medicines, herbs, non-prescription drugs, or dietary supplements you use. Also tell them if you smoke, drink alcohol, or use illegal drugs. Some items may interact with your medicine. What should I watch for while using this medication? This medication may make you feel generally unwell. This is not uncommon as chemotherapy can affect healthy cells as well as cancer cells. Report any  side effects. Continue your course of treatment even though you feel ill unless your care team tells you to stop. You may need blood work while you are taking this medication. Certain genetic factors may decrease the safety of this medication. Your care team may use genetic tests to determine treatment. This medication can cause serious allergic reactions. To reduce your risk, your care  team may give you other medications to take before receiving this one. Be sure to follow the directions from your care team. Check with your care team if you have severe diarrhea, nausea, and vomiting, or if you sweat a lot. The loss of too much body fluid may make it dangerous for you to take this medication. Talk to your care team if you wish to become pregnant or think you might be pregnant. This medication can cause serious birth defects if taken during pregnancy or if you get pregnant within 6 months after stopping treatment. A negative pregnancy test is required before starting this medication. A reliable form of contraception is recommended while taking this medication and for 6 months after stopping treatment. Talk to your care team about reliable forms of contraception. Use a condom during sex and for 3 months after stopping treatment. Tell your care team right away if you think your partner might be pregnant. This medication can cause serious birth defects. Do not breast-feed while taking this medication and for 1 month after stopping therapy. This medication may cause infertility. Talk to your care team if you are concerned about your fertility. This medication may increase your risk of getting an infection. Call your care team for advice if you get a fever, chills, sore throat, or other symptoms of a cold or flu. Do not treat yourself. Try to avoid being around people who are sick. Avoid taking medications that contain aspirin, acetaminophen , ibuprofen , naproxen , or ketoprofen unless instructed by your care team. These medications may hide a fever. This medication may increase blood sugar. The risk may be higher in patients who already have diabetes. Ask your care team what you can do to lower your risk of diabetes while taking this medication. What side effects may I notice from receiving this medication? Side effects that you should report to your care team as soon as possible: Allergic  reactions--skin rash, itching, hives, swelling of the face, lips, tongue, or throat Infection--fever, chills, cough, or sore throat Infusion reactions--chest pain, shortness of breath or trouble breathing, feeling faint or lightheaded Low red blood cell level--unusual weakness or fatigue, dizziness, headache, trouble breathing Severe or prolonged diarrhea Side effects that usually do not require medical attention (report these to your care team if they continue or are bothersome): Constipation Diarrhea Fatigue Hair loss Loss of appetite Nausea Vomiting This list may not describe all possible side effects. Call your doctor for medical advice about side effects. You may report side effects to FDA at 1-800-FDA-1088. Where should I keep my medication? This medication is given in a hospital or clinic. It will not be stored at home. NOTE: This sheet is a summary. It may not cover all possible information. If you have questions about this medicine, talk to your doctor, pharmacist, or health care provider.  2024 Elsevier/Gold Standard (2023-01-04 00:00:00)  BELOW ARE SYMPTOMS THAT SHOULD BE REPORTED IMMEDIATELY: *FEVER GREATER THAN 100.4 F (38 C) OR HIGHER *CHILLS OR SWEATING *NAUSEA AND VOMITING THAT IS NOT CONTROLLED WITH YOUR NAUSEA MEDICATION *UNUSUAL SHORTNESS OF BREATH *UNUSUAL BRUISING OR BLEEDING *URINARY PROBLEMS (pain or burning  when urinating, or frequent urination) *BOWEL PROBLEMS (unusual diarrhea, constipation, pain near the anus) TENDERNESS IN MOUTH AND THROAT WITH OR WITHOUT PRESENCE OF ULCERS (sore throat, sores in mouth, or a toothache) UNUSUAL RASH, SWELLING OR PAIN  UNUSUAL VAGINAL DISCHARGE OR ITCHING   Items with * indicate a potential emergency and should be followed up as soon as possible or go to the Emergency Department if any problems should occur.  Please show the CHEMOTHERAPY ALERT CARD or IMMUNOTHERAPY ALERT CARD at check-in to the Emergency Department and  triage nurse.  Should you have questions after your visit or need to cancel or reschedule your appointment, please contact [facility] [place] - A DEPT OF [facility] [phone]  and follow the prompts.  Office hours are 8:00 a.m. to 4:30 p.m. Monday - Friday. Please note that voicemails left after 4:00 p.m. may not be returned until the following business day.  We are closed weekends and major holidays. You have access to a nurse at all times for urgent questions. Please call the main number to the clinic [phone] and follow the prompts.  For any non-urgent questions, you may also contact your provider using MyChart. We now offer e-Visits for anyone 47 and older to request care online for non-urgent symptoms. For details visit [email or web address].   Also download the MyChart app! Go to the app store, search "MyChart", open the app, select [place], and log in with your MyChart username and password.

## 2023-06-21 ENCOUNTER — Inpatient Hospital Stay

## 2023-06-21 VITALS — BP 130/75 | HR 79 | Temp 98.1°F | Resp 19

## 2023-06-21 DIAGNOSIS — Z5112 Encounter for antineoplastic immunotherapy: Secondary | ICD-10-CM | POA: Diagnosis not present

## 2023-06-21 DIAGNOSIS — Z170 Estrogen receptor positive status [ER+]: Secondary | ICD-10-CM

## 2023-06-21 MED ORDER — PEGFILGRASTIM-CBQV 6 MG/0.6ML ~~LOC~~ SOSY
6.0000 mg | PREFILLED_SYRINGE | Freq: Once | SUBCUTANEOUS | Status: AC
  Administered 2023-06-21: 6 mg via SUBCUTANEOUS
  Filled 2023-06-21: qty 0.6

## 2023-06-21 NOTE — Patient Instructions (Signed)
 [facility] [place] - A DEPT OF [facility]  Discharge Instructions: Thank you for choosing [facility] to provide your oncology and hematology care.  If you have a lab appointment with the Cancer Center - please note that after April 8th, 2024, all labs will be drawn in the cancer center.  You do not have to check in or register with the main entrance as you have in the past but will complete your check-in in the cancer center.  Wear comfortable clothing and clothing appropriate for easy access to any Portacath or PICC line.   We strive to give you quality time with your provider. You may need to reschedule your appointment if you arrive late (15 or more minutes).  Arriving late affects you and other patients whose appointments are after yours.  Also, if you miss three or more appointments without notifying the office, you may be dismissed from the clinic at the provider's discretion.      For prescription refill requests, have your pharmacy contact our office and allow 72 hours for refills to be completed.    Today you received the following chemotherapy and/or immunotherapy agents Udenyca.  Pegfilgrastim Injection What is this medication? PEGFILGRASTIM (PEG fil gra stim) lowers the risk of infection in people who are receiving chemotherapy. It works by Systems analyst make more white blood cells, which protects your body from infection. It may also be used to help people who have been exposed to high doses of radiation. This medicine may be used for other purposes; ask your health care provider or pharmacist if you have questions. COMMON BRAND NAME(S): Cherly Hensen, Neulasta, Nyvepria, Stimufend, UDENYCA, UDENYCA ONBODY, Ziextenzo What should I tell my care team before I take this medication? They need to know if you have any of these conditions: Kidney disease Latex allergy Ongoing radiation therapy Sickle cell disease Skin reactions to  acrylic adhesives (On-Body Injector only) An unusual or allergic reaction to pegfilgrastim, filgrastim, other medications, foods, dyes, or preservatives Pregnant or trying to get pregnant Breast-feeding How should I use this medication? This medication is for injection under the skin. If you get this medication at home, you will be taught how to prepare and give the pre-filled syringe or how to use the On-body Injector. Refer to the patient Instructions for Use for detailed instructions. Use exactly as directed. Tell your care team immediately if you suspect that the On-body Injector may not have performed as intended or if you suspect the use of the On-body Injector resulted in a missed or partial dose. It is important that you put your used needles and syringes in a special sharps container. Do not put them in a trash can. If you do not have a sharps container, call your pharmacist or care team to get one. Talk to your care team about the use of this medication in children. While this medication may be prescribed for selected conditions, precautions do apply. Overdosage: If you think you have taken too much of this medicine contact a poison control center or emergency room at once. NOTE: This medicine is only for you. Do not share this medicine with others. What if I miss a dose? It is important not to miss your dose. Call your care team if you miss your dose. If you miss a dose due to an On-body Injector failure or leakage, a new dose should be administered as soon as possible using a single prefilled syringe for  manual use. What may interact with this medication? Interactions have not been studied. This list may not describe all possible interactions. Give your health care provider a list of all the medicines, herbs, non-prescription drugs, or dietary supplements you use. Also tell them if you smoke, drink alcohol, or use illegal drugs. Some items may interact with your medicine. What should I  watch for while using this medication? Your condition will be monitored carefully while you are receiving this medication. You may need blood work done while you are taking this medication. Talk to your care team about your risk of cancer. You may be more at risk for certain types of cancer if you take this medication. If you are going to need a MRI, CT scan, or other procedure, tell your care team that you are using this medication (On-Body Injector only). What side effects may I notice from receiving this medication? Side effects that you should report to your care team as soon as possible: Allergic reactions--skin rash, itching, hives, swelling of the face, lips, tongue, or throat Capillary leak syndrome--stomach or muscle pain, unusual weakness or fatigue, feeling faint or lightheaded, decrease in the amount of urine, swelling of the ankles, hands, or feet, trouble breathing High white blood cell level--fever, fatigue, trouble breathing, night sweats, change in vision, weight loss Inflammation of the aorta--fever, fatigue, back, chest, or stomach pain, severe headache Kidney injury (glomerulonephritis)--decrease in the amount of urine, red or dark Adron Geisel urine, foamy or bubbly urine, swelling of the ankles, hands, or feet Shortness of breath or trouble breathing Spleen injury--pain in upper left stomach or shoulder Unusual bruising or bleeding Side effects that usually do not require medical attention (report to your care team if they continue or are bothersome): Bone pain Pain in the hands or feet This list may not describe all possible side effects. Call your doctor for medical advice about side effects. You may report side effects to FDA at 1-800-FDA-1088. Where should I keep my medication? Keep out of the reach of children. If you are using this medication at home, you will be instructed on how to store it. Throw away any unused medication after the expiration date on the label. NOTE:  This sheet is a summary. It may not cover all possible information. If you have questions about this medicine, talk to your doctor, pharmacist, or health care provider.  2024 Elsevier/Gold Standard (2020-12-23 00:00:00)       To help prevent nausea and vomiting after your treatment, we encourage you to take your nausea medication as directed.  BELOW ARE SYMPTOMS THAT SHOULD BE REPORTED IMMEDIATELY: *FEVER GREATER THAN 100.4 F (38 C) OR HIGHER *CHILLS OR SWEATING *NAUSEA AND VOMITING THAT IS NOT CONTROLLED WITH YOUR NAUSEA MEDICATION *UNUSUAL SHORTNESS OF BREATH *UNUSUAL BRUISING OR BLEEDING *URINARY PROBLEMS (pain or burning when urinating, or frequent urination) *BOWEL PROBLEMS (unusual diarrhea, constipation, pain near the anus) TENDERNESS IN MOUTH AND THROAT WITH OR WITHOUT PRESENCE OF ULCERS (sore throat, sores in mouth, or a toothache) UNUSUAL RASH, SWELLING OR PAIN  UNUSUAL VAGINAL DISCHARGE OR ITCHING   Items with * indicate a potential emergency and should be followed up as soon as possible or go to the Emergency Department if any problems should occur.  Please show the CHEMOTHERAPY ALERT CARD or IMMUNOTHERAPY ALERT CARD at check-in to the Emergency Department and triage nurse.  Should you have questions after your visit or need to cancel or reschedule your appointment, please contact [facility] [place] -  A DEPT OF [facility] [phone]  and follow the prompts.  Office hours are 8:00 a.m. to 4:30 p.m. Monday - Friday. Please note that voicemails left after 4:00 p.m. may not be returned until the following business day.  We are closed weekends and major holidays. You have access to a nurse at all times for urgent questions. Please call the main number to the clinic [phone] and follow the prompts.  For any non-urgent questions, you may also contact your provider using MyChart. We now offer e-Visits for anyone 58 and older to request care online for  non-urgent symptoms. For details visit [email or web address].   Also download the MyChart app! Go to the app store, search "MyChart", open the app, select [employer], and log in with your MyChart username and password.

## 2023-06-21 NOTE — Progress Notes (Signed)
 Patient tolerated Udenyca injection with no complaints voiced.  Site clean and dry with no bruising or swelling noted.  No complaints of pain.  Discharged with vital signs stable and no signs or symptoms of distress noted.

## 2023-06-25 ENCOUNTER — Other Ambulatory Visit: Payer: Self-pay

## 2023-06-27 ENCOUNTER — Inpatient Hospital Stay: Admitting: Hematology

## 2023-06-27 ENCOUNTER — Inpatient Hospital Stay

## 2023-07-01 ENCOUNTER — Other Ambulatory Visit: Payer: Self-pay

## 2023-07-02 ENCOUNTER — Other Ambulatory Visit: Payer: Self-pay

## 2023-07-02 ENCOUNTER — Inpatient Hospital Stay

## 2023-07-02 ENCOUNTER — Inpatient Hospital Stay (HOSPITAL_BASED_OUTPATIENT_CLINIC_OR_DEPARTMENT_OTHER): Admitting: Hematology

## 2023-07-02 VITALS — BP 130/73 | HR 91 | Temp 98.0°F | Resp 19 | Wt 157.0 lb

## 2023-07-02 VITALS — BP 109/66 | HR 78 | Temp 98.5°F | Resp 18

## 2023-07-02 DIAGNOSIS — Z170 Estrogen receptor positive status [ER+]: Secondary | ICD-10-CM

## 2023-07-02 DIAGNOSIS — C50411 Malignant neoplasm of upper-outer quadrant of right female breast: Secondary | ICD-10-CM | POA: Diagnosis not present

## 2023-07-02 DIAGNOSIS — Z5112 Encounter for antineoplastic immunotherapy: Secondary | ICD-10-CM | POA: Diagnosis not present

## 2023-07-02 LAB — CBC WITH DIFFERENTIAL/PLATELET
Abs Immature Granulocytes: 0.11 10*3/uL — ABNORMAL HIGH (ref 0.00–0.07)
Basophils Absolute: 0.1 10*3/uL (ref 0.0–0.1)
Basophils Relative: 1 %
Eosinophils Absolute: 0 10*3/uL (ref 0.0–0.5)
Eosinophils Relative: 1 %
HCT: 28.9 % — ABNORMAL LOW (ref 36.0–46.0)
Hemoglobin: 9.3 g/dL — ABNORMAL LOW (ref 12.0–15.0)
Immature Granulocytes: 2 %
Lymphocytes Relative: 17 %
Lymphs Abs: 1.1 10*3/uL (ref 0.7–4.0)
MCH: 32.1 pg (ref 26.0–34.0)
MCHC: 32.2 g/dL (ref 30.0–36.0)
MCV: 99.7 fL (ref 80.0–100.0)
Monocytes Absolute: 0.4 10*3/uL (ref 0.1–1.0)
Monocytes Relative: 6 %
Neutro Abs: 4.8 10*3/uL (ref 1.7–7.7)
Neutrophils Relative %: 73 %
Platelets: 190 10*3/uL (ref 150–400)
RBC: 2.9 MIL/uL — ABNORMAL LOW (ref 3.87–5.11)
RDW: 18.7 % — ABNORMAL HIGH (ref 11.5–15.5)
WBC: 6.4 10*3/uL (ref 4.0–10.5)
nRBC: 0.3 % — ABNORMAL HIGH (ref 0.0–0.2)

## 2023-07-02 LAB — COMPREHENSIVE METABOLIC PANEL WITH GFR
ALT: 12 U/L (ref 0–44)
AST: 19 U/L (ref 15–41)
Albumin: 3 g/dL — ABNORMAL LOW (ref 3.5–5.0)
Alkaline Phosphatase: 212 U/L — ABNORMAL HIGH (ref 38–126)
Anion gap: 9 (ref 5–15)
BUN: 8 mg/dL (ref 6–20)
CO2: 20 mmol/L — ABNORMAL LOW (ref 22–32)
Calcium: 8.3 mg/dL — ABNORMAL LOW (ref 8.9–10.3)
Chloride: 109 mmol/L (ref 98–111)
Creatinine, Ser: 0.6 mg/dL (ref 0.44–1.00)
GFR, Estimated: >60 mL/min (ref >60)
Glucose, Bld: 135 mg/dL — ABNORMAL HIGH (ref 70–99)
Potassium: 3.3 mmol/L — ABNORMAL LOW (ref 3.5–5.1)
Sodium: 138 mmol/L (ref 135–145)
Total Bilirubin: 0.5 mg/dL (ref 0.0–1.2)
Total Protein: 6.6 g/dL (ref 6.5–8.1)

## 2023-07-02 LAB — MAGNESIUM: Magnesium: 1.6 mg/dL — ABNORMAL LOW (ref 1.7–2.4)

## 2023-07-02 LAB — PHOSPHORUS: Phosphorus: 2.8 mg/dL (ref 2.5–4.6)

## 2023-07-02 MED ORDER — ACETAMINOPHEN 325 MG PO TABS
650.0000 mg | ORAL_TABLET | Freq: Once | ORAL | Status: AC
  Administered 2023-07-02: 650 mg via ORAL
  Filled 2023-07-02: qty 2

## 2023-07-02 MED ORDER — HEPARIN SOD (PORK) LOCK FLUSH 100 UNIT/ML IV SOLN
500.0000 [IU] | Freq: Once | INTRAVENOUS | Status: AC | PRN
  Administered 2023-07-02: 500 [IU]

## 2023-07-02 MED ORDER — SODIUM CHLORIDE 0.9 % IV SOLN
150.0000 mg | Freq: Once | INTRAVENOUS | Status: AC
  Administered 2023-07-02: 150 mg via INTRAVENOUS
  Filled 2023-07-02: qty 5

## 2023-07-02 MED ORDER — MAGNESIUM SULFATE 2 GM/50ML IV SOLN
2.0000 g | Freq: Once | INTRAVENOUS | Status: AC
  Administered 2023-07-02: 2 g via INTRAVENOUS
  Filled 2023-07-02: qty 50

## 2023-07-02 MED ORDER — DEXAMETHASONE SODIUM PHOSPHATE 10 MG/ML IJ SOLN
10.0000 mg | Freq: Once | INTRAMUSCULAR | Status: AC
Start: 2023-07-02 — End: 2023-07-02
  Administered 2023-07-02: 10 mg via INTRAVENOUS
  Filled 2023-07-02: qty 1

## 2023-07-02 MED ORDER — POTASSIUM CHLORIDE CRYS ER 20 MEQ PO TBCR
40.0000 meq | EXTENDED_RELEASE_TABLET | Freq: Once | ORAL | Status: AC
  Administered 2023-07-02: 40 meq via ORAL
  Filled 2023-07-02: qty 2

## 2023-07-02 MED ORDER — PALONOSETRON HCL INJECTION 0.25 MG/5ML
0.2500 mg | Freq: Once | INTRAVENOUS | Status: AC
Start: 2023-07-02 — End: 2023-07-02
  Administered 2023-07-02: 0.25 mg via INTRAVENOUS
  Filled 2023-07-02: qty 5

## 2023-07-02 MED ORDER — CETIRIZINE HCL 10 MG/ML IV SOLN
10.0000 mg | Freq: Once | INTRAVENOUS | Status: AC
Start: 2023-07-02 — End: 2023-07-02
  Administered 2023-07-02: 10 mg via INTRAVENOUS
  Filled 2023-07-02: qty 1

## 2023-07-02 MED ORDER — SODIUM CHLORIDE 0.9% FLUSH
10.0000 mL | INTRAVENOUS | Status: DC | PRN
Start: 2023-07-02 — End: 2023-07-02
  Administered 2023-07-02: 10 mL

## 2023-07-02 MED ORDER — FAMOTIDINE IN NACL 20-0.9 MG/50ML-% IV SOLN
20.0000 mg | Freq: Once | INTRAVENOUS | Status: AC
  Administered 2023-07-02: 20 mg via INTRAVENOUS
  Filled 2023-07-02: qty 50

## 2023-07-02 MED ORDER — SODIUM CHLORIDE 0.9 % IV SOLN
10.0000 mg/kg | Freq: Once | INTRAVENOUS | Status: AC
  Administered 2023-07-02: 720 mg via INTRAVENOUS
  Filled 2023-07-02: qty 72

## 2023-07-02 MED ORDER — SODIUM CHLORIDE 0.9 % IV SOLN: INTRAVENOUS | Status: DC

## 2023-07-02 MED ORDER — SODIUM CHLORIDE 0.9% FLUSH
10.0000 mL | Freq: Once | INTRAVENOUS | Status: AC
  Administered 2023-07-02: 10 mL via INTRAVENOUS

## 2023-07-02 NOTE — Patient Instructions (Signed)
 [facility] [place] - A DEPT OF [facility]  Discharge Instructions: Thank you for choosing [facility] to provide your oncology and hematology care.  If you have a lab appointment with the Cancer Center - please note that after April 8th, 2024, all labs will be drawn in the cancer center.  You do not have to check in or register with the main entrance as you have in the past but will complete your check-in in the cancer center.  Wear comfortable clothing and clothing appropriate for easy access to any Portacath or PICC line.   We strive to give you quality time with your provider. You may need to reschedule your appointment if you arrive late (15 or more minutes).  Arriving late affects you and other patients whose appointments are after yours.  Also, if you miss three or more appointments without notifying the office, you may be dismissed from the clinic at the provider's discretion.      For prescription refill requests, have your pharmacy contact our office and allow 72 hours for refills to be completed.    Today you received the following chemotherapy and/or immunotherapy agents Trodelvy  and 2g IV magnesium  sulfate   To help prevent nausea and vomiting after your treatment, we encourage you to take your nausea medication as directed.  Sacituzumab Govitecan  Injection What is this medication? SACITUZUMAB GOVETECAN (SAK i TOOZ ue mab GOE vi TEE can) treats breast cancer. It works by blocking a protein that causes cancer cells to grow and multiply. This helps to slow or stop the spread of cancer cells. This medicine may be used for other purposes; ask your health care provider or pharmacist if you have questions. COMMON BRAND NAME(S): TRODELVY  What should I tell my care team before I take this medication? They need to know if you have any of these conditions: Carry the UGT1A1*28 gene Infection Liver disease An unusual or allergic reaction to sacituzumab  govitecan, other medications, foods, dyes, or preservatives Pregnant or trying to get pregnant Breast-feeding How should I use this medication? This medication is injected into a vein. It is given by your care team in a hospital or clinic setting. Talk to your care team about the use of this medication in children. Special care may be needed. Overdosage: If you think you have taken too much of this medicine contact a poison control center or emergency room at once. NOTE: This medicine is only for you. Do not share this medicine with others. What if I miss a dose? Keep appointments for follow-up doses. It is important not to miss your dose. Call your care team if you are unable to keep an appointment. What may interact with this medication? This medication may affect how other medications work, and other medications may affect the way this medication works. Talk with your care team about all of the medications you take. They may suggest changes to your treatment plan to lower the risk of side effects and to make sure your medications work as intended. This list may not describe all possible interactions. Give your health care provider a list of all the medicines, herbs, non-prescription drugs, or dietary supplements you use. Also tell them if you smoke, drink alcohol, or use illegal drugs. Some items may interact with your medicine. What should I watch for while using this medication? This medication may make you feel generally unwell. This is not uncommon as chemotherapy can affect healthy cells as well  as cancer cells. Report any side effects. Continue your course of treatment even though you feel ill unless your care team tells you to stop. You may need blood work while you are taking this medication. Certain genetic factors may decrease the safety of this medication. Your care team may use genetic tests to determine treatment. This medication can cause serious allergic reactions. To reduce your  risk, your care team may give you other medications to take before receiving this one. Be sure to follow the directions from your care team. Check with your care team if you have severe diarrhea, nausea, and vomiting, or if you sweat a lot. The loss of too much body fluid may make it dangerous for you to take this medication. Talk to your care team if you wish to become pregnant or think you might be pregnant. This medication can cause serious birth defects if taken during pregnancy or if you get pregnant within 6 months after stopping treatment. A negative pregnancy test is required before starting this medication. A reliable form of contraception is recommended while taking this medication and for 6 months after stopping treatment. Talk to your care team about reliable forms of contraception. Use a condom during sex and for 3 months after stopping treatment. Tell your care team right away if you think your partner might be pregnant. This medication can cause serious birth defects. Do not breast-feed while taking this medication and for 1 month after stopping therapy. This medication may cause infertility. Talk to your care team if you are concerned about your fertility. This medication may increase your risk of getting an infection. Call your care team for advice if you get a fever, chills, sore throat, or other symptoms of a cold or flu. Do not treat yourself. Try to avoid being around people who are sick. Avoid taking medications that contain aspirin, acetaminophen , ibuprofen , naproxen , or ketoprofen unless instructed by your care team. These medications may hide a fever. This medication may increase blood sugar. The risk may be higher in patients who already have diabetes. Ask your care team what you can do to lower your risk of diabetes while taking this medication. What side effects may I notice from receiving this medication? Side effects that you should report to your care team as soon as  possible: Allergic reactions--skin rash, itching, hives, swelling of the face, lips, tongue, or throat Infection--fever, chills, cough, or sore throat Infusion reactions--chest pain, shortness of breath or trouble breathing, feeling faint or lightheaded Low red blood cell level--unusual weakness or fatigue, dizziness, headache, trouble breathing Severe or prolonged diarrhea Side effects that usually do not require medical attention (report these to your care team if they continue or are bothersome): Constipation Diarrhea Fatigue Hair loss Loss of appetite Nausea Vomiting This list may not describe all possible side effects. Call your doctor for medical advice about side effects. You may report side effects to FDA at 1-800-FDA-1088. Where should I keep my medication? This medication is given in a hospital or clinic. It will not be stored at home. NOTE: This sheet is a summary. It may not cover all possible information. If you have questions about this medicine, talk to your doctor, pharmacist, or health care provider.  2024 Elsevier/Gold Standard (2023-01-04 00:00:00)  BELOW ARE SYMPTOMS THAT SHOULD BE REPORTED IMMEDIATELY: *FEVER GREATER THAN 100.4 F (38 C) OR HIGHER *CHILLS OR SWEATING *NAUSEA AND VOMITING THAT IS NOT CONTROLLED WITH YOUR NAUSEA MEDICATION *UNUSUAL SHORTNESS OF BREATH *UNUSUAL BRUISING OR BLEEDING *  URINARY PROBLEMS (pain or burning when urinating, or frequent urination) *BOWEL PROBLEMS (unusual diarrhea, constipation, pain near the anus) TENDERNESS IN MOUTH AND THROAT WITH OR WITHOUT PRESENCE OF ULCERS (sore throat, sores in mouth, or a toothache) UNUSUAL RASH, SWELLING OR PAIN  UNUSUAL VAGINAL DISCHARGE OR ITCHING   Items with * indicate a potential emergency and should be followed up as soon as possible or go to the Emergency Department if any problems should occur.  Please show the CHEMOTHERAPY ALERT CARD or IMMUNOTHERAPY ALERT CARD at check-in to the  Emergency Department and triage nurse.  Should you have questions after your visit or need to cancel or reschedule your appointment, please contact [facility] [place] - A DEPT OF [facility] [phone]  and follow the prompts.  Office hours are 8:00 a.m. to 4:30 p.m. Monday - Friday. Please note that voicemails left after 4:00 p.m. may not be returned until the following business day.  We are closed weekends and major holidays. You have access to a nurse at all times for urgent questions. Please call the main number to the clinic [phone] and follow the prompts.  For any non-urgent questions, you may also contact your provider using MyChart. We now offer e-Visits for anyone 56 and older to request care online for non-urgent symptoms. For details visit [email or web address].   Also download the MyChart app! Go to the app store, search "MyChart", open the app, select [facility], and log in with your MyChart username and password.

## 2023-07-02 NOTE — Progress Notes (Addendum)
 Patient presents today for Trodelvy  infusion. Patient is in satisfactory condition with no new complaints voiced.  Vital signs are stable.  Labs reviewed by Dr. Cheree Cords during the office visit and all labs are within treatment parameters.Patient will receive 2g IV magnesium  and potassium chloride  40 mEq p.o x 1 dose per Dr.Katragadda's standing orders. We will proceed with treatment per MD orders.   Treatment given today per MD orders. Tolerated infusion without adverse affects. Vital signs stable. No complaints at this time. Discharged from clinic ambulatory in stable condition. Alert and oriented x 3. F/U with [facility] as scheduled.

## 2023-07-02 NOTE — Progress Notes (Signed)
 Confirmed going to full dose Trodelvy  10 mg/kg   Patient has done well with 2 cycles may decrease rate to 60 minute Trodelvy  infusoin.  V.O. Dr Davina Ester, PharmD

## 2023-07-02 NOTE — Progress Notes (Signed)
 [facility] [address], [place] [address]    Clinic Day:  07/02/2023  Referring physician: Minus Amel, MD  Patient Care Team: Minus Amel, MD as PCP - General (Family Medicine) Riley Cheadle, Windsor Hatcher, MD (Gastroenterology) Retta Caster, MD as Consulting Physician (Radiation Oncology) Dareen Ebbing, MD as Consulting Physician (General Surgery) Auther Bo, RN as Oncology Nurse Navigator Alane Hsu, RN as Oncology Nurse Navigator Paulett Boros, MD as Medical Oncologist (Medical Oncology) Gerhard Knuckles, RN as Oncology Nurse Navigator (Medical Oncology)   ASSESSMENT & PLAN:   Assessment: 1.  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  Noticed skin changes in the right breast 3 months ago. - Mammogram (06/19/2022): Large 8-9 cm mass in the right breast with 2 abnormal axillary lymph nodes and skin thickening. 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  1.8 cm ill-defined enhancing mass in the lateral left breast suspicious for additional malignancy.  Right breast mass involves right chest wall including pectoralis and intercostal muscles.  Prominent subcentimeter left axillary lymph nodes. - PET scan (07/12/2022): Intensely hypermetabolic right breast mass with right axillary lymph nodes.  Hypermetabolic metastatic lymph node in the posterior triangle of the right neck.  Hypermetabolic left axillary lymph nodes.  No evidence of metastatic adenopathy in the mediastinum/abdomen/pelvis.  No evidence of visceral metastasis.  Multiple skeletal metastasis:  Left sacral ala 2.5 cm, left iliac bone, L4 and T12 vertebral bodies, multiple rib lesions, left scapular lesion. - She was evaluated by Dr. Arno Bibles and was recommended treatment with ribociclib  and anastrozole . - Germline mutation testing: Negative. - Anastrozole  and ribociclib  started on 07/25/2022, discontinued on 12/13/2022 due to progression. - PET scan (11/15/2022): Mixed response to therapy with overall progression of disease with new bone metastasis and liver metastasis.  New periportal lymph nodes. - Liver biopsy (12/07/2022): Metastatic poorly differentiated adenocarcinoma consistent with breast primary.  ER +2% with moderate staining, PR 0%, HER2 (1+). - Guardant360: APC G1312, T p53, MYC amplification, FGF R2 amplification, EGFR amplification - PD-L1 (22 C3): 0% - Abraxane  day 1, day 8 every 21 days started on 12/21/2022, carboplatin  AUC on day 1 and day 8 added with cycle 2 on 01/10/2023, discontinued on 05/30/2023 due to progression. - NGS (12/07/2022): MS-stable, TMB-low, PTEN pathogenic variant, genomic LOH high (18%), HER2 (1+), PD-L1 (22 C3) CPS-10. - Sacituzumab cycle 1 started on 06/10/2023   2.  Social/family history: - She lives at home with her husband.  Used OCP for approximately 16-18 years.  Underwent hysterectomy after endometrial ablation when she was found to have fibroids causing pain.  No family history of breast or other malignancies.  Smokes 1 pack of cigarettes per day.    Plan: 1.  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  No increased hypermetabolic nodularity along the right breast cutaneous surface. - She was started on Sacituzumab 10 mg/kg on 06/10/2023.  On day 8 we have cut back the dose to 7.5 mg/kg due to neutropenia and requiring G-CSF.  We have also added long-acting G-CSF on  day 9. - She reported more energy and improvement in taste since Sacituzumab started.  She has diarrhea on and off  which is controlled with Lomotil /Imodium.  She felt achiness for a couple of days after G-CSF. - Reviewed labs today: LFTs are normal.  Albumin is 3.0.  CBC grossly normal.  She may proceed with cycle 2-day 1 today at 10 mg/kg.  RTC 3 weeks for follow-up.   2.  Bone metastasis: - Continue Zometa  every 12 weeks.  Denies any dental problems.  Calcium is 8.3 with albumin 3.0.  3.  Hypomagnesemia: - Continue magnesium  twice daily.  Magnesium  is 1.6 today.  4.  Leg swelling: - Continue Lasix  20 mg in the mornings daily as needed.     Orders Placed This Encounter  Procedures   Cancer antigen 15-3    Standing Status:   Future    Expected Date:   07/09/2023    Expiration Date:   07/08/2024   Cancer antigen 27.29    Standing Status:   Future    Expected Date:   07/09/2023    Expiration Date:   07/08/2024   Cancer antigen 15-3    Standing Status:   Future    Expected Date:   07/25/2023    Expiration Date:   07/24/2024   Cancer antigen 27.29    Standing Status:   Future    Expected Date:   07/25/2023    Expiration Date:   07/24/2024   Cancer antigen 15-3    Standing Status:   Future    Expected Date:   08/15/2023    Expiration Date:   08/14/2024   Cancer antigen 27.29    Standing Status:   Future    Expected Date:   08/15/2023    Expiration Date:   08/14/2024      Hurman Maiden R Teague,acting as a scribe for Paulett Boros, MD.,have documented all relevant documentation on the behalf of Paulett Boros, MD,as directed by  Paulett Boros, MD while in the presence of Paulett Boros, MD.  I, Paulett Boros MD, have reviewed the above documentation for accuracy and completeness, and I agree with the above.     Paulett Boros, MD   5/27/20259:26 AM  CHIEF COMPLAINT:   Diagnosis: metastatic inflammatory right breast cancer    Cancer Staging  Malignant neoplasm of upper-outer quadrant of right breast in female, estrogen receptor positive (HCC) Staging form: Breast,  AJCC 8th Edition - Clinical: Stage IV (cT4, cN2, cM1, G2, ER+, PR+, HER2-) - Signed by Iruku, Praveena, MD on 07/17/2022    Prior Therapy: none  Current Therapy:  Anastrozole  and ribociclib     HISTORY OF PRESENT ILLNESS:   Oncology History  Malignant neoplasm of upper-outer quadrant of right breast in female, estrogen receptor positive (HCC)  06/19/2022 Mammogram   Mammogram showed large mass in the right breast which appears to measure 8 to 9 cm on ultrasound, 2 abnormal right axillary lymph node and a third borderline lymph node.  Skin thickening noted along the medial aspect of left breast which may be an extension of the marked skin thickening diffusely on the right.  Otherwise no left breast abnormalities are identified.   06/22/2022 Pathology Results   Needle core biopsy from the right breast upper outer quadrant showed grade 2 IDC, both lymph nodes with metastatic adenocarcinoma.  Prognostic showed ER 90% positive strong staining PR 70% positive strong staining Ki-67 of 20% HER2 negative by IHC 1+   07/03/2022 Initial Diagnosis   Malignant  neoplasm of upper-outer quadrant of right breast in female, estrogen receptor positive (HCC)   07/04/2022 Cancer Staging   Staging form: Breast, AJCC 8th Edition - Clinical: Stage IV (cT4, cN2, cM1, G2, ER+, PR+, HER2-) - Signed by Murleen Arms, MD on 07/17/2022 Stage prefix: Initial diagnosis Histologic grading system: 3 grade system   07/16/2022 - 07/16/2022 Chemotherapy   Patient is on Treatment Plan : BREAST ADJUVANT DOSE DENSE AC q14d / PACLitaxel  q7d      Genetic Testing   [facility] Custom Panel+RNA was Negative. Report date is 07/10/2022.  The Custom Hereditary Cancers Panel offered by [facility] includes sequencing and/or deletion duplication testing of the following 43 genes: APC, ATM, AXIN2, BAP1, BARD1, BMPR1A, BRCA1, BRCA2, BRIP1, CDH1, CDK4, CDKN2A (p14ARF and p16INK4a only), CHEK2, CTNNA1, EPCAM (Deletion/duplication testing only), FH,  GREM1 (promoter region duplication testing only), HOXB13, KIT, MBD4, MEN1, MLH1, MSH2, MSH3, MSH6, MUTYH, NF1, NHTL1, PALB2, PDGFRA, PMS2, POLD1, POLE, PTEN, RAD51C, RAD51D, SMAD4, SMARCA4. STK11, TP53, TSC1, TSC2, and VHL.   12/13/2022 - 12/13/2022 Chemotherapy   Patient is on Treatment Plan : BREAST METASTATIC Fam-Trastuzumab Deruxtecan-nxki (Enhertu) (5.4) q21d     12/21/2022 - 05/23/2023 Chemotherapy   Patient is on Treatment Plan : BREAST Paclitaxel -Albumin (Abraxane ) (100) D1,8 + Carboplatin  AUC2 D1,8 every 3 weeks, q21d     06/10/2023 -  Chemotherapy   Patient is on Treatment Plan : BREAST Sacituzumab govitecan -hziy (Trodelvy ) D1,8 q21d        INTERVAL HISTORY:   Monica Soto is a 58 y.o. female presenting to clinic today for follow up of metastatic inflammatory right breast cancer. She was last seen by me on 06/06/23.  Today, she states that she is doing well overall. Her appetite level is at 75%. Her energy level is at 75%.   Jannel tolerated her new chemotherapy, Sacituzumab, well. She does note increased diarrhea that comes and goes for 2-3 days at a time, as well as occasional mild nausea. Diarrhea is controlled with Imodium. Her energy levels and taste have significantly improved since discontinuing Keytruda . She has not required lasix  and is taking Magnesium  BID.   Jamita notes 3 days after treatment she reports indigestion, with the symptom of burping frequently. She reports aches for 2-3 days after white cell booster shot.   PAST MEDICAL HISTORY:   Past Medical History: Past Medical History:  Diagnosis Date   Depression    Diabetes mellitus without complication (HCC)    Erosive esophagitis 04/26/2010   EGD by Dr. Riley Cheadle, small hiatal hernia   GERD (gastroesophageal reflux disease)    History of stomach ulcers    IBS (irritable bowel syndrome)    Diarrhea predominant   Microscopic colitis 04/26/2010   Colonoscopy by Dr. Riley Cheadle, good response with Entocort   Tubular adenoma of colon  04/26/2010   Junction of descending and sigmoid 40 CM from anus    Surgical History: Past Surgical History:  Procedure Laterality Date   ABDOMINAL HYSTERECTOMY     APPENDECTOMY  2/11   Dr. Tina Fordyce with a delayed closure   BREAST BIOPSY Right 06/22/2022   US  RT BREAST BX W LOC DEV 1ST LESION IMG BX SPEC US  GUIDE 06/22/2022 [facility] MAMMOGRAPHY   PORTACATH PLACEMENT N/A 07/16/2022   Procedure: INSERTION PORT-A-CATH;  Surgeon: Dareen Ebbing, MD;  Location: [facility];  Service: General;  Laterality: N/A;   TONSILLECTOMY     TUBAL LIGATION      Social History: Social History   Socioeconomic History   Marital status:  Married    Spouse name: Not on file   Number of children: Not on file   Years of education: Not on file   Highest education level: Not on file  Occupational History    Employer: [employer]    Comment: Third shift  Tobacco Use   Smoking status: Every Day    Current packs/day: 0.50    Types: Cigarettes   Smokeless tobacco: Never  Vaping Use   Vaping status: Never Used  Substance and Sexual Activity   Alcohol use: Yes    Alcohol/week: 1.0 standard drink of alcohol    Types: 1 Glasses of wine per week    Comment: seldom   Drug use: No   Sexual activity: Yes    Birth control/protection: Surgical  Other Topics Concern   Not on file  Social History Narrative   Not on file   Social Drivers of Health   Financial Resource Strain: Not on file  Food Insecurity: No Food Insecurity (07/04/2022)   Hunger Vital Sign    Worried About Running Out of Food in the Last Year: Never true    Ran Out of Food in the Last Year: Never true  Transportation Needs: No Transportation Needs (07/04/2022)   PRAPARE - Administrator, Civil Service (Medical): No    Lack of Transportation (Non-Medical): No  Physical Activity: Not on file  Stress: Not on file  Social Connections: Not on file  Intimate Partner Violence: Not on file    Family  History: Family History  Problem Relation Age of Onset   Diabetes Mother    Coronary artery disease Mother    Healthy Father     Current Medications:  Current Outpatient Medications:    amoxicillin -clavulanate (AUGMENTIN ) 875-125 MG tablet, Take 1 tablet by mouth 2 (two) times daily., Disp: 14 tablet, Rfl: 0   Aspirin-Acetaminophen -Caffeine (GOODY HEADACHE PO), Take 1 packet by mouth 2 (two) times daily as needed (for pain)., Disp: , Rfl:    Cholecalciferol 1.25 MG (50000 UT) capsule, Take 1 capsule by mouth once a week., Disp: , Rfl:    diphenoxylate -atropine  (LOMOTIL ) 2.5-0.025 MG tablet, Take 2 tablets after first loose stool and 1 after each loose stool thereafter.  Do not exceed 8 tablets in 1 day., Disp: 30 tablet, Rfl: 3   fluticasone  (FLONASE ) 50 MCG/ACT nasal spray, Place 1 spray into both nostrils daily., Disp: 16 g, Rfl: 0   furosemide  (LASIX ) 20 MG tablet, Take 1 tablet (20 mg total) by mouth daily as needed for edema., Disp: 30 tablet, Rfl: 2   HYDROcodone -acetaminophen  (NORCO/VICODIN) 5-325 MG tablet, Take 1 tablet by mouth every 6 (six) hours as needed for moderate pain., Disp: 15 tablet, Rfl: 0   lidocaine -prilocaine  (EMLA ) cream, Apply to affected area once, Disp: 30 g, Rfl: 3   magnesium  oxide (MAG-OX) 400 (240 Mg) MG tablet, Take 1 tablet (400 mg total) by mouth 2 (two) times daily., Disp: 60 tablet, Rfl: 3   ondansetron  (ZOFRAN ) 8 MG tablet, TAKE 1 TABLET(8 MG) BY MOUTH EVERY 8 HOURS AS NEEDED FOR NAUSEA OR VOMITING. START THIRD DAY AFTER DOXORUBICIN/ CYCLOPHOSPHAMIDE CHEMOTHERAPY, Disp: 30 tablet, Rfl: 1   prochlorperazine  (COMPAZINE ) 10 MG tablet, TAKE 1 TABLET(10 MG) BY MOUTH EVERY 6 HOURS AS NEEDED FOR NAUSEA OR VOMITING, Disp: 30 tablet, Rfl: 1 No current facility-administered medications for this visit.  Facility-Administered Medications Ordered in Other Visits:    0.9 %  sodium chloride  infusion, , Intravenous, Continuous, Paulett Boros, MD  acetaminophen   (TYLENOL ) tablet 650 mg, 650 mg, Oral, Once, Paulett Boros, MD   cetirizine  (QUZYTTIR ) injection 10 mg, 10 mg, Intravenous, Once, Paulett Boros, MD   dexamethasone  (DECADRON ) injection 10 mg, 10 mg, Intravenous, Once, Leontyne Manville, MD   famotidine  (PEPCID ) IVPB 20 mg premix, 20 mg, Intravenous, Once, Paulett Boros, MD   fosaprepitant  (EMEND) 150 mg in sodium chloride  0.9 % 145 mL IVPB, 150 mg, Intravenous, Once, Santresa Levett, MD   heparin  lock flush 100 unit/mL, 500 Units, Intracatheter, Once PRN, Gavan Nordby, MD   magnesium  sulfate IVPB 2 g 50 mL, 2 g, Intravenous, Once, Paulett Boros, MD   palonosetron  (ALOXI ) injection 0.25 mg, 0.25 mg, Intravenous, Once, Paulett Boros, MD   potassium chloride  SA (KLOR-CON  M) CR tablet 40 mEq, 40 mEq, Oral, Once, Paulett Boros, MD   sacituzumab govitecan -hziy (TRODELVY ) 720 mg in sodium chloride  0.9 % 250 mL (2.236 mg/mL) chemo infusion, 10 mg/kg (Treatment Plan Recorded), Intravenous, Once, Paulett Boros, MD   sodium chloride  flush (NS) 0.9 % injection 10 mL, 10 mL, Intracatheter, PRN, Richetta Cubillos, MD   Allergies: No Known Allergies  REVIEW OF SYSTEMS:   Review of Systems  Constitutional:  Negative for chills, fatigue and fever.  HENT:   Negative for lump/mass, mouth sores, nosebleeds, sore throat and trouble swallowing.   Eyes:  Negative for eye problems.  Respiratory:  Negative for cough and shortness of breath.   Cardiovascular:  Negative for chest pain, leg swelling and palpitations.  Gastrointestinal:  Positive for diarrhea and nausea. Negative for abdominal pain, constipation and vomiting.  Genitourinary:  Negative for bladder incontinence, difficulty urinating, dysuria, frequency, hematuria and nocturia.   Musculoskeletal:  Negative for arthralgias, back pain, flank pain, myalgias and neck pain.  Skin:  Negative for itching and rash.  Neurological:  Negative for  dizziness, headaches and numbness.  Hematological:  Does not bruise/bleed easily.  Psychiatric/Behavioral:  Negative for depression, sleep disturbance and suicidal ideas. The patient is not nervous/anxious.   All other systems reviewed and are negative.    VITALS:   Blood pressure 130/73, pulse 91, temperature 98 F (36.7 C), temperature source Oral, resp. rate 19, weight 156 lb 15.5 oz (71.2 kg), SpO2 100%.  Wt Readings from Last 3 Encounters:  07/02/23 156 lb 15.5 oz (71.2 kg)  06/17/23 157 lb 8 oz (71.4 kg)  06/10/23 161 lb 1.6 oz (73.1 kg)    Body mass index is 26.12 kg/m.  Performance status (ECOG): 1 - Symptomatic but completely ambulatory   PHYSICAL EXAM:   Physical Exam Vitals and nursing note reviewed. Exam conducted with a chaperone present.  Constitutional:      Appearance: Normal appearance.  Cardiovascular:     Rate and Rhythm: Normal rate and regular rhythm.     Pulses: Normal pulses.     Heart sounds: Normal heart sounds.  Pulmonary:     Effort: Pulmonary effort is normal.     Breath sounds: Normal breath sounds.  Abdominal:     Palpations: Abdomen is soft. There is no hepatomegaly, splenomegaly or mass.     Tenderness: There is no abdominal tenderness.  Musculoskeletal:     Right lower leg: No edema.     Left lower leg: No edema.  Lymphadenopathy:     Cervical: No cervical adenopathy.     Right cervical: No superficial, deep or posterior cervical adenopathy.    Left cervical: No superficial, deep or posterior cervical adenopathy.     Upper Body:  Right upper body: No supraclavicular or axillary adenopathy.     Left upper body: No supraclavicular or axillary adenopathy.  Neurological:     General: No focal deficit present.     Mental Status: She is alert and oriented to person, place, and time.  Psychiatric:        Mood and Affect: Mood normal.        Behavior: Behavior normal.     LABS:      Latest Ref Rng & Units 07/02/2023    8:22 AM  06/19/2023    7:54 AM 06/17/2023    8:22 AM  CBC  WBC 4.0 - 10.5 K/uL 6.4  6.2  2.0   Hemoglobin 12.0 - 15.0 g/dL 9.3  9.6  9.8   Hematocrit 36.0 - 46.0 % 28.9  30.5  31.4   Platelets 150 - 400 K/uL 190  134  116       Latest Ref Rng & Units 07/02/2023    8:22 AM 06/19/2023    7:54 AM 06/17/2023    8:22 AM  CMP  Glucose 70 - 99 mg/dL 161  096  045   BUN 6 - 20 mg/dL 8  7  8    Creatinine 0.44 - 1.00 mg/dL 4.09  8.11  9.14   Sodium 135 - 145 mmol/L 138  138  134   Potassium 3.5 - 5.1 mmol/L 3.3  3.4  3.3   Chloride 98 - 111 mmol/L 109  108  102   CO2 22 - 32 mmol/L 20  20  21    Calcium 8.9 - 10.3 mg/dL 8.3  8.4  8.5   Total Protein 6.5 - 8.1 g/dL 6.6  6.6  7.0   Total Bilirubin 0.0 - 1.2 mg/dL 0.5  0.3  0.3   Alkaline Phos 38 - 126 U/L 212  137  154   AST 15 - 41 U/L 19  30  35   ALT 0 - 44 U/L 12  18  20       No results found for: "CEA1", "CEA" / No results found for: "CEA1", "CEA" No results found for: "PSA1" No results found for: "NWG956" No results found for: "CAN125"  No results found for: "TOTALPROTELP", "ALBUMINELP", "A1GS", "A2GS", "BETS", "BETA2SER", "GAMS", "MSPIKE", "SPEI" No results found for: "TIBC", "FERRITIN", "IRONPCTSAT" No results found for: "LDH"   STUDIES:   No results found.

## 2023-07-02 NOTE — Patient Instructions (Signed)

## 2023-07-04 ENCOUNTER — Inpatient Hospital Stay

## 2023-07-09 ENCOUNTER — Inpatient Hospital Stay

## 2023-07-09 ENCOUNTER — Inpatient Hospital Stay: Attending: Hematology and Oncology

## 2023-07-09 ENCOUNTER — Other Ambulatory Visit: Payer: Self-pay | Admitting: Hematology

## 2023-07-09 ENCOUNTER — Encounter: Payer: Self-pay | Admitting: Hematology

## 2023-07-09 DIAGNOSIS — Z9049 Acquired absence of other specified parts of digestive tract: Secondary | ICD-10-CM | POA: Diagnosis not present

## 2023-07-09 DIAGNOSIS — Z5189 Encounter for other specified aftercare: Secondary | ICD-10-CM | POA: Insufficient documentation

## 2023-07-09 DIAGNOSIS — Z170 Estrogen receptor positive status [ER+]: Secondary | ICD-10-CM

## 2023-07-09 DIAGNOSIS — F17210 Nicotine dependence, cigarettes, uncomplicated: Secondary | ICD-10-CM | POA: Diagnosis not present

## 2023-07-09 DIAGNOSIS — R197 Diarrhea, unspecified: Secondary | ICD-10-CM | POA: Diagnosis not present

## 2023-07-09 DIAGNOSIS — N6320 Unspecified lump in the left breast, unspecified quadrant: Secondary | ICD-10-CM | POA: Insufficient documentation

## 2023-07-09 DIAGNOSIS — M7989 Other specified soft tissue disorders: Secondary | ICD-10-CM | POA: Insufficient documentation

## 2023-07-09 DIAGNOSIS — Z8249 Family history of ischemic heart disease and other diseases of the circulatory system: Secondary | ICD-10-CM | POA: Insufficient documentation

## 2023-07-09 DIAGNOSIS — C50411 Malignant neoplasm of upper-outer quadrant of right female breast: Secondary | ICD-10-CM | POA: Diagnosis present

## 2023-07-09 DIAGNOSIS — Z1732 Human epidermal growth factor receptor 2 negative status: Secondary | ICD-10-CM | POA: Insufficient documentation

## 2023-07-09 DIAGNOSIS — Z79899 Other long term (current) drug therapy: Secondary | ICD-10-CM | POA: Insufficient documentation

## 2023-07-09 DIAGNOSIS — Z833 Family history of diabetes mellitus: Secondary | ICD-10-CM | POA: Insufficient documentation

## 2023-07-09 DIAGNOSIS — Z5112 Encounter for antineoplastic immunotherapy: Secondary | ICD-10-CM | POA: Diagnosis present

## 2023-07-09 DIAGNOSIS — C7951 Secondary malignant neoplasm of bone: Secondary | ICD-10-CM | POA: Insufficient documentation

## 2023-07-09 DIAGNOSIS — M533 Sacrococcygeal disorders, not elsewhere classified: Secondary | ICD-10-CM | POA: Diagnosis not present

## 2023-07-09 DIAGNOSIS — Z5111 Encounter for antineoplastic chemotherapy: Secondary | ICD-10-CM | POA: Insufficient documentation

## 2023-07-09 DIAGNOSIS — C787 Secondary malignant neoplasm of liver and intrahepatic bile duct: Secondary | ICD-10-CM | POA: Diagnosis not present

## 2023-07-09 DIAGNOSIS — Z1721 Progesterone receptor positive status: Secondary | ICD-10-CM | POA: Diagnosis not present

## 2023-07-09 DIAGNOSIS — Z860101 Personal history of adenomatous and serrated colon polyps: Secondary | ICD-10-CM | POA: Insufficient documentation

## 2023-07-09 DIAGNOSIS — R5383 Other fatigue: Secondary | ICD-10-CM | POA: Diagnosis not present

## 2023-07-09 DIAGNOSIS — R110 Nausea: Secondary | ICD-10-CM | POA: Insufficient documentation

## 2023-07-09 DIAGNOSIS — Z90710 Acquired absence of both cervix and uterus: Secondary | ICD-10-CM | POA: Diagnosis not present

## 2023-07-09 DIAGNOSIS — D702 Other drug-induced agranulocytosis: Secondary | ICD-10-CM

## 2023-07-09 LAB — CBC WITH DIFFERENTIAL/PLATELET
Abs Immature Granulocytes: 0.03 10*3/uL (ref 0.00–0.07)
Basophils Absolute: 0 10*3/uL (ref 0.0–0.1)
Basophils Relative: 1 %
Eosinophils Absolute: 0 10*3/uL (ref 0.0–0.5)
Eosinophils Relative: 2 %
HCT: 27.7 % — ABNORMAL LOW (ref 36.0–46.0)
Hemoglobin: 8.7 g/dL — ABNORMAL LOW (ref 12.0–15.0)
Immature Granulocytes: 1 %
Lymphocytes Relative: 49 %
Lymphs Abs: 1.1 10*3/uL (ref 0.7–4.0)
MCH: 30.9 pg (ref 26.0–34.0)
MCHC: 31.4 g/dL (ref 30.0–36.0)
MCV: 98.2 fL (ref 80.0–100.0)
Monocytes Absolute: 0.3 10*3/uL (ref 0.1–1.0)
Monocytes Relative: 14 %
Neutro Abs: 0.8 10*3/uL — ABNORMAL LOW (ref 1.7–7.7)
Neutrophils Relative %: 33 %
Platelets: 174 10*3/uL (ref 150–400)
RBC: 2.82 MIL/uL — ABNORMAL LOW (ref 3.87–5.11)
RDW: 19 % — ABNORMAL HIGH (ref 11.5–15.5)
WBC: 2.3 10*3/uL — ABNORMAL LOW (ref 4.0–10.5)
nRBC: 3.1 % — ABNORMAL HIGH (ref 0.0–0.2)

## 2023-07-09 LAB — COMPREHENSIVE METABOLIC PANEL WITH GFR
ALT: 15 U/L (ref 0–44)
AST: 17 U/L (ref 15–41)
Albumin: 3.2 g/dL — ABNORMAL LOW (ref 3.5–5.0)
Alkaline Phosphatase: 205 U/L — ABNORMAL HIGH (ref 38–126)
Anion gap: 7 (ref 5–15)
BUN: 9 mg/dL (ref 6–20)
CO2: 22 mmol/L (ref 22–32)
Calcium: 8.3 mg/dL — ABNORMAL LOW (ref 8.9–10.3)
Chloride: 104 mmol/L (ref 98–111)
Creatinine, Ser: 0.55 mg/dL (ref 0.44–1.00)
GFR, Estimated: >60 mL/min (ref >60)
Glucose, Bld: 111 mg/dL — ABNORMAL HIGH (ref 70–99)
Potassium: 3.1 mmol/L — ABNORMAL LOW (ref 3.5–5.1)
Sodium: 133 mmol/L — ABNORMAL LOW (ref 135–145)
Total Bilirubin: 0.5 mg/dL (ref 0.0–1.2)
Total Protein: 6.7 g/dL (ref 6.5–8.1)

## 2023-07-09 LAB — MAGNESIUM: Magnesium: 1.8 mg/dL (ref 1.7–2.4)

## 2023-07-09 LAB — PHOSPHORUS: Phosphorus: 2.7 mg/dL (ref 2.5–4.6)

## 2023-07-09 MED ORDER — FILGRASTIM-SNDZ 300 MCG/0.5ML IJ SOSY
300.0000 ug | PREFILLED_SYRINGE | Freq: Once | INTRAMUSCULAR | Status: AC
  Administered 2023-07-09: 300 ug via SUBCUTANEOUS
  Filled 2023-07-09: qty 0.5

## 2023-07-09 MED ORDER — HEPARIN SOD (PORK) LOCK FLUSH 100 UNIT/ML IV SOLN
500.0000 [IU] | Freq: Once | INTRAVENOUS | Status: AC
  Administered 2023-07-09: 500 [IU] via INTRAVENOUS

## 2023-07-09 MED ORDER — SODIUM CHLORIDE 0.9% FLUSH
10.0000 mL | INTRAVENOUS | Status: AC
  Administered 2023-07-09: 10 mL

## 2023-07-09 MED ORDER — POTASSIUM CHLORIDE CRYS ER 20 MEQ PO TBCR
40.0000 meq | EXTENDED_RELEASE_TABLET | Freq: Once | ORAL | Status: AC
  Administered 2023-07-09: 40 meq via ORAL
  Filled 2023-07-09: qty 2

## 2023-07-09 NOTE — Progress Notes (Signed)
 Labs reviewed with MD and treatment team.  40 mEQ Potassium PO given per standing orders. Per MD to hold treatment today, give Zarxio , and rescheduled pt for port labs and possible treatment tomorrow. Pt updated and agrees to plan. Port deaccessed per pt request. Vitals stable. Pt stable at discharge.   Monica Soto

## 2023-07-10 ENCOUNTER — Inpatient Hospital Stay

## 2023-07-10 ENCOUNTER — Other Ambulatory Visit: Payer: Self-pay

## 2023-07-10 VITALS — BP 110/59 | HR 76 | Temp 98.0°F | Resp 18

## 2023-07-10 DIAGNOSIS — Z5112 Encounter for antineoplastic immunotherapy: Secondary | ICD-10-CM | POA: Diagnosis not present

## 2023-07-10 DIAGNOSIS — Z170 Estrogen receptor positive status [ER+]: Secondary | ICD-10-CM

## 2023-07-10 LAB — COMPREHENSIVE METABOLIC PANEL WITH GFR
ALT: 13 U/L (ref 0–44)
AST: 17 U/L (ref 15–41)
Albumin: 3.1 g/dL — ABNORMAL LOW (ref 3.5–5.0)
Alkaline Phosphatase: 211 U/L — ABNORMAL HIGH (ref 38–126)
Anion gap: 7 (ref 5–15)
BUN: 7 mg/dL (ref 6–20)
CO2: 21 mmol/L — ABNORMAL LOW (ref 22–32)
Calcium: 8.2 mg/dL — ABNORMAL LOW (ref 8.9–10.3)
Chloride: 108 mmol/L (ref 98–111)
Creatinine, Ser: 0.52 mg/dL (ref 0.44–1.00)
GFR, Estimated: >60 mL/min (ref >60)
Glucose, Bld: 105 mg/dL — ABNORMAL HIGH (ref 70–99)
Potassium: 3.3 mmol/L — ABNORMAL LOW (ref 3.5–5.1)
Sodium: 136 mmol/L (ref 135–145)
Total Bilirubin: 0.4 mg/dL (ref 0.0–1.2)
Total Protein: 6.5 g/dL (ref 6.5–8.1)

## 2023-07-10 LAB — CBC WITH DIFFERENTIAL/PLATELET
Abs Immature Granulocytes: 0.4 10*3/uL — ABNORMAL HIGH (ref 0.00–0.07)
Band Neutrophils: 3 %
Basophils Absolute: 0 10*3/uL (ref 0.0–0.1)
Basophils Relative: 0 %
Eosinophils Absolute: 0.1 10*3/uL (ref 0.0–0.5)
Eosinophils Relative: 1 %
HCT: 27.1 % — ABNORMAL LOW (ref 36.0–46.0)
Hemoglobin: 8.6 g/dL — ABNORMAL LOW (ref 12.0–15.0)
Lymphocytes Relative: 22 %
Lymphs Abs: 1.4 10*3/uL (ref 0.7–4.0)
MCH: 31.6 pg (ref 26.0–34.0)
MCHC: 31.7 g/dL (ref 30.0–36.0)
MCV: 99.6 fL (ref 80.0–100.0)
Metamyelocytes Relative: 7 %
Monocytes Absolute: 0.3 10*3/uL (ref 0.1–1.0)
Monocytes Relative: 5 %
Neutro Abs: 4 10*3/uL (ref 1.7–7.7)
Neutrophils Relative %: 62 %
Platelets: 168 10*3/uL (ref 150–400)
RBC: 2.72 MIL/uL — ABNORMAL LOW (ref 3.87–5.11)
RDW: 19.5 % — ABNORMAL HIGH (ref 11.5–15.5)
WBC: 6.2 10*3/uL (ref 4.0–10.5)
nRBC: 2 % — ABNORMAL HIGH (ref 0.0–0.2)

## 2023-07-10 LAB — CANCER ANTIGEN 15-3: CA 15-3: 426 U/mL — ABNORMAL HIGH (ref 0.0–25.0)

## 2023-07-10 LAB — PHOSPHORUS: Phosphorus: 2.6 mg/dL (ref 2.5–4.6)

## 2023-07-10 LAB — CANCER ANTIGEN 27.29: CA 27.29: 548.1 U/mL — ABNORMAL HIGH (ref 0.0–38.6)

## 2023-07-10 LAB — MAGNESIUM: Magnesium: 1.6 mg/dL — ABNORMAL LOW (ref 1.7–2.4)

## 2023-07-10 MED ORDER — PALONOSETRON HCL INJECTION 0.25 MG/5ML
0.2500 mg | Freq: Once | INTRAVENOUS | Status: AC
  Administered 2023-07-10: 0.25 mg via INTRAVENOUS
  Filled 2023-07-10: qty 5

## 2023-07-10 MED ORDER — HEPARIN SOD (PORK) LOCK FLUSH 100 UNIT/ML IV SOLN
500.0000 [IU] | Freq: Once | INTRAVENOUS | Status: AC | PRN
  Administered 2023-07-10: 500 [IU]

## 2023-07-10 MED ORDER — SODIUM CHLORIDE 0.9% FLUSH
10.0000 mL | INTRAVENOUS | Status: DC | PRN
  Administered 2023-07-10: 10 mL

## 2023-07-10 MED ORDER — FAMOTIDINE IN NACL 20-0.9 MG/50ML-% IV SOLN
20.0000 mg | Freq: Once | INTRAVENOUS | Status: AC
  Administered 2023-07-10: 20 mg via INTRAVENOUS
  Filled 2023-07-10: qty 50

## 2023-07-10 MED ORDER — POTASSIUM CHLORIDE CRYS ER 20 MEQ PO TBCR
40.0000 meq | EXTENDED_RELEASE_TABLET | Freq: Once | ORAL | Status: AC
  Administered 2023-07-10: 40 meq via ORAL
  Filled 2023-07-10: qty 2

## 2023-07-10 MED ORDER — SODIUM CHLORIDE 0.9 % IV SOLN
7.5000 mg/kg | Freq: Once | INTRAVENOUS | Status: AC
  Administered 2023-07-10: 540 mg via INTRAVENOUS
  Filled 2023-07-10: qty 54

## 2023-07-10 MED ORDER — CETIRIZINE HCL 10 MG/ML IV SOLN
10.0000 mg | Freq: Once | INTRAVENOUS | Status: AC
  Administered 2023-07-10: 10 mg via INTRAVENOUS
  Filled 2023-07-10: qty 1

## 2023-07-10 MED ORDER — ACETAMINOPHEN 325 MG PO TABS
650.0000 mg | ORAL_TABLET | Freq: Once | ORAL | Status: AC
  Administered 2023-07-10: 650 mg via ORAL
  Filled 2023-07-10: qty 2

## 2023-07-10 MED ORDER — SODIUM CHLORIDE 0.9 % IV SOLN
150.0000 mg | Freq: Once | INTRAVENOUS | Status: AC
  Administered 2023-07-10: 150 mg via INTRAVENOUS
  Filled 2023-07-10: qty 150

## 2023-07-10 MED ORDER — ATROPINE SULFATE 1 MG/ML IV SOLN: 0.5000 mg | Freq: Once | INTRAVENOUS | Status: DC | PRN

## 2023-07-10 MED ORDER — SODIUM CHLORIDE 0.9 % IV SOLN: INTRAVENOUS | Status: DC

## 2023-07-10 MED ORDER — DEXAMETHASONE SODIUM PHOSPHATE 10 MG/ML IJ SOLN
10.0000 mg | Freq: Once | INTRAMUSCULAR | Status: AC
  Administered 2023-07-10: 10 mg via INTRAVENOUS
  Filled 2023-07-10: qty 1

## 2023-07-10 MED ORDER — SODIUM CHLORIDE 0.9% FLUSH
10.0000 mL | Freq: Once | INTRAVENOUS | Status: AC
  Administered 2023-07-10: 10 mL via INTRAVENOUS

## 2023-07-10 MED ORDER — MAGNESIUM SULFATE 2 GM/50ML IV SOLN
2.0000 g | Freq: Once | INTRAVENOUS | Status: AC
  Administered 2023-07-10: 2 g via INTRAVENOUS
  Filled 2023-07-10: qty 50

## 2023-07-10 NOTE — Progress Notes (Signed)
 Dose Trodelvy  confirmed at 7.5 mg/kg going forward due to neutropenia.  V.O. Dr Davina Ester, PharmD

## 2023-07-10 NOTE — Patient Instructions (Signed)
 [facility] [place] - A DEPT OF [facility]  Discharge Instructions: Thank you for choosing [facility] to provide your oncology and hematology care.  If you have a lab appointment with the Cancer Center - please note that after April 8th, 2024, all labs will be drawn in the cancer center.  You do not have to check in or register with the main entrance as you have in the past but will complete your check-in in the cancer center.  Wear comfortable clothing and clothing appropriate for easy access to any Portacath or PICC line.   We strive to give you quality time with your provider. You may need to reschedule your appointment if you arrive late (15 or more minutes).  Arriving late affects you and other patients whose appointments are after yours.  Also, if you miss three or more appointments without notifying the office, you may be dismissed from the clinic at the provider's discretion.      For prescription refill requests, have your pharmacy contact our office and allow 72 hours for refills to be completed.    Today you received the following chemotherapy and/or immunotherapy agents Trodelvy .   Sacituzumab Govitecan  Injection What is this medication? SACITUZUMAB GOVITECAN  (SAK i TOOZ ue mab GOE vi TEE can) treats breast cancer. It works by blocking a protein that causes cancer cells to grow and multiply. This helps to slow or stop the spread of cancer cells. This medicine may be used for other purposes; ask your health care provider or pharmacist if you have questions. COMMON BRAND NAME(S): TRODELVY  What should I tell my care team before I take this medication? They need to know if you have any of these conditions: Carry the UGT1A1*28 gene Infection Liver disease An unusual or allergic reaction to sacituzumab govitecan , other medications, foods, dyes, or preservatives Pregnant or trying to get pregnant Breast-feeding How should I use this  medication? This medication is injected into a vein. It is given by your care team in a hospital or clinic setting. Talk to your care team about the use of this medication in children. Special care may be needed. Overdosage: If you think you have taken too much of this medicine contact a poison control center or emergency room at once. NOTE: This medicine is only for you. Do not share this medicine with others. What if I miss a dose? Keep appointments for follow-up doses. It is important not to miss your dose. Call your care team if you are unable to keep an appointment. What may interact with this medication? This medication may affect how other medications work, and other medications may affect the way this medication works. Talk with your care team about all of the medications you take. They may suggest changes to your treatment plan to lower the risk of side effects and to make sure your medications work as intended. This list may not describe all possible interactions. Give your health care provider a list of all the medicines, herbs, non-prescription drugs, or dietary supplements you use. Also tell them if you smoke, drink alcohol, or use illegal drugs. Some items may interact with your medicine. What should I watch for while using this medication? This medication may make you feel generally unwell. This is not uncommon as chemotherapy can affect healthy cells as well as cancer cells. Report any side effects. Continue your course of treatment even though you feel ill unless your care team tells you to stop.  You may need blood work while you are taking this medication. Certain genetic factors may decrease the safety of this medication. Your care team may use genetic tests to determine treatment. This medication can cause serious allergic reactions. To reduce your risk, your care team may give you other medications to take before receiving this one. Be sure to follow the directions from your care  team. Check with your care team if you have severe diarrhea, nausea, and vomiting, or if you sweat a lot. The loss of too much body fluid may make it dangerous for you to take this medication. Talk to your care team if you wish to become pregnant or think you might be pregnant. This medication can cause serious birth defects if taken during pregnancy or if you get pregnant within 6 months after stopping treatment. A negative pregnancy test is required before starting this medication. A reliable form of contraception is recommended while taking this medication and for 6 months after stopping treatment. Talk to your care team about reliable forms of contraception. Use a condom during sex and for 3 months after stopping treatment. Tell your care team right away if you think your partner might be pregnant. This medication can cause serious birth defects. Do not breast-feed while taking this medication and for 1 month after stopping therapy. This medication may cause infertility. Talk to your care team if you are concerned about your fertility. This medication may increase your risk of getting an infection. Call your care team for advice if you get a fever, chills, sore throat, or other symptoms of a cold or flu. Do not treat yourself. Try to avoid being around people who are sick. Avoid taking medications that contain aspirin, acetaminophen , ibuprofen , naproxen , or ketoprofen unless instructed by your care team. These medications may hide a fever. This medication may increase blood sugar. The risk may be higher in patients who already have diabetes. Ask your care team what you can do to lower your risk of diabetes while taking this medication. What side effects may I notice from receiving this medication? Side effects that you should report to your care team as soon as possible: Allergic reactions--skin rash, itching, hives, swelling of the face, lips, tongue, or throat Infection--fever, chills, cough, or  sore throat Infusion reactions--chest pain, shortness of breath or trouble breathing, feeling faint or lightheaded Low red blood cell level--unusual weakness or fatigue, dizziness, headache, trouble breathing Severe or prolonged diarrhea Side effects that usually do not require medical attention (report these to your care team if they continue or are bothersome): Constipation Diarrhea Fatigue Hair loss Loss of appetite Nausea Vomiting This list may not describe all possible side effects. Call your doctor for medical advice about side effects. You may report side effects to FDA at 1-800-FDA-1088. Where should I keep my medication? This medication is given in a hospital or clinic. It will not be stored at home. NOTE: This sheet is a summary. It may not cover all possible information. If you have questions about this medicine, talk to your doctor, pharmacist, or health care provider.  2025 Elsevier/Gold Standard (2023-01-07 00:00:00)   To help prevent nausea and vomiting after your treatment, we encourage you to take your nausea medication as directed.  BELOW ARE SYMPTOMS THAT SHOULD BE REPORTED IMMEDIATELY: *FEVER GREATER THAN 100.4 F (38 C) OR HIGHER *CHILLS OR SWEATING *NAUSEA AND VOMITING THAT IS NOT CONTROLLED WITH YOUR NAUSEA MEDICATION *UNUSUAL SHORTNESS OF BREATH *UNUSUAL BRUISING OR BLEEDING *URINARY PROBLEMS (pain or  burning when urinating, or frequent urination) *BOWEL PROBLEMS (unusual diarrhea, constipation, pain near the anus) TENDERNESS IN MOUTH AND THROAT WITH OR WITHOUT PRESENCE OF ULCERS (sore throat, sores in mouth, or a toothache) UNUSUAL RASH, SWELLING OR PAIN  UNUSUAL VAGINAL DISCHARGE OR ITCHING   Items with * indicate a potential emergency and should be followed up as soon as possible or go to the Emergency Department if any problems should occur.  Please show the CHEMOTHERAPY ALERT CARD or IMMUNOTHERAPY ALERT CARD at check-in to the Emergency Department and  triage nurse.  Should you have questions after your visit or need to cancel or reschedule your appointment, please contact [facility] [place] - A DEPT OF [facility] [phone]  and follow the prompts.  Office hours are 8:00 a.m. to 4:30 p.m. Monday - Friday. Please note that voicemails left after 4:00 p.m. may not be returned until the following business day.  We are closed weekends and major holidays. You have access to a nurse at all times for urgent questions. Please call the main number to the clinic [phone] and follow the prompts.  For any non-urgent questions, you may also contact your provider using MyChart. We now offer e-Visits for anyone 79 and older to request care online for non-urgent symptoms. For details visit [email or web address].   Also download the MyChart app! Go to the app store, search "MyChart", open the app, select [facility], and log in with your MyChart username and password.

## 2023-07-10 NOTE — Progress Notes (Signed)
 Patient presents today for chemotherapy infusion.  Patient is in satisfactory condition with no new complaints voiced.  Vital signs are stable.  Labs reviewed and all labs are within treatment parameters. Patient's potassium 3.3 and Magnesium  1.6. Patient to receive potassium 40mEq PO and Magnesium  2g IV per Dr Milburn Aliment orders. We will proceed with treatment per MD orders.    Patient tolerated treatment well with no complaints voiced.  Patient left ambulatory in stable condition.  Vital signs stable at discharge.  Follow up as scheduled.

## 2023-07-10 NOTE — Progress Notes (Signed)
 Patient presents today for treatment (Trodelvy ). ANC 4.0 today. Vital signs and labs within parameters for treatment.

## 2023-07-11 ENCOUNTER — Inpatient Hospital Stay

## 2023-07-11 VITALS — BP 130/75 | HR 83 | Temp 98.3°F | Resp 20

## 2023-07-11 DIAGNOSIS — Z5112 Encounter for antineoplastic immunotherapy: Secondary | ICD-10-CM | POA: Diagnosis not present

## 2023-07-11 DIAGNOSIS — Z170 Estrogen receptor positive status [ER+]: Secondary | ICD-10-CM

## 2023-07-11 MED ORDER — PEGFILGRASTIM-CBQV 6 MG/0.6ML ~~LOC~~ SOSY
6.0000 mg | PREFILLED_SYRINGE | Freq: Once | SUBCUTANEOUS | Status: AC
  Administered 2023-07-11: 6 mg via SUBCUTANEOUS
  Filled 2023-07-11: qty 0.6

## 2023-07-11 NOTE — Patient Instructions (Signed)

## 2023-07-11 NOTE — Progress Notes (Signed)
 Patient tolerated Udenyca injection with no complaints voiced.  Site clean and dry with no bruising or swelling noted at site.  See MAR for details.  Band aid applied.  Patient stable during and after injection.  Vss with discharge and left in satisfactory condition with no s/s of distress noted. All follow ups as scheduled.   Attilio Zeitler [employer]

## 2023-07-15 ENCOUNTER — Inpatient Hospital Stay

## 2023-07-23 ENCOUNTER — Inpatient Hospital Stay

## 2023-07-23 ENCOUNTER — Inpatient Hospital Stay (HOSPITAL_BASED_OUTPATIENT_CLINIC_OR_DEPARTMENT_OTHER): Admitting: Hematology

## 2023-07-23 VITALS — BP 110/49 | HR 88 | Temp 97.6°F | Resp 17

## 2023-07-23 DIAGNOSIS — C50411 Malignant neoplasm of upper-outer quadrant of right female breast: Secondary | ICD-10-CM

## 2023-07-23 DIAGNOSIS — Z170 Estrogen receptor positive status [ER+]: Secondary | ICD-10-CM

## 2023-07-23 DIAGNOSIS — D702 Other drug-induced agranulocytosis: Secondary | ICD-10-CM

## 2023-07-23 DIAGNOSIS — Z5112 Encounter for antineoplastic immunotherapy: Secondary | ICD-10-CM | POA: Diagnosis not present

## 2023-07-23 LAB — CBC WITH DIFFERENTIAL/PLATELET
Abs Immature Granulocytes: 0.38 10*3/uL — ABNORMAL HIGH (ref 0.00–0.07)
Basophils Absolute: 0 10*3/uL (ref 0.0–0.1)
Basophils Relative: 1 %
Eosinophils Absolute: 0.1 10*3/uL (ref 0.0–0.5)
Eosinophils Relative: 1 %
HCT: 27.5 % — ABNORMAL LOW (ref 36.0–46.0)
Hemoglobin: 8.5 g/dL — ABNORMAL LOW (ref 12.0–15.0)
Immature Granulocytes: 5 %
Lymphocytes Relative: 15 %
Lymphs Abs: 1.3 10*3/uL (ref 0.7–4.0)
MCH: 31.3 pg (ref 26.0–34.0)
MCHC: 30.9 g/dL (ref 30.0–36.0)
MCV: 101.1 fL — ABNORMAL HIGH (ref 80.0–100.0)
Monocytes Absolute: 0.4 10*3/uL (ref 0.1–1.0)
Monocytes Relative: 5 %
Neutro Abs: 6.4 10*3/uL (ref 1.7–7.7)
Neutrophils Relative %: 73 %
Platelets: 180 10*3/uL (ref 150–400)
RBC: 2.72 MIL/uL — ABNORMAL LOW (ref 3.87–5.11)
RDW: 20.3 % — ABNORMAL HIGH (ref 11.5–15.5)
WBC: 8.5 10*3/uL (ref 4.0–10.5)
nRBC: 0.5 % — ABNORMAL HIGH (ref 0.0–0.2)

## 2023-07-23 LAB — COMPREHENSIVE METABOLIC PANEL WITH GFR
ALT: 11 U/L (ref 0–44)
AST: 15 U/L (ref 15–41)
Albumin: 3.1 g/dL — ABNORMAL LOW (ref 3.5–5.0)
Alkaline Phosphatase: 249 U/L — ABNORMAL HIGH (ref 38–126)
Anion gap: 9 (ref 5–15)
BUN: 7 mg/dL (ref 6–20)
CO2: 23 mmol/L (ref 22–32)
Calcium: 8.2 mg/dL — ABNORMAL LOW (ref 8.9–10.3)
Chloride: 107 mmol/L (ref 98–111)
Creatinine, Ser: 0.54 mg/dL (ref 0.44–1.00)
GFR, Estimated: >60 mL/min (ref >60)
Glucose, Bld: 107 mg/dL — ABNORMAL HIGH (ref 70–99)
Potassium: 3.6 mmol/L (ref 3.5–5.1)
Sodium: 139 mmol/L (ref 135–145)
Total Bilirubin: 0.4 mg/dL (ref 0.0–1.2)
Total Protein: 6.6 g/dL (ref 6.5–8.1)

## 2023-07-23 LAB — PHOSPHORUS: Phosphorus: 2.9 mg/dL (ref 2.5–4.6)

## 2023-07-23 LAB — MAGNESIUM: Magnesium: 1.8 mg/dL (ref 1.7–2.4)

## 2023-07-23 MED ORDER — ACETAMINOPHEN 325 MG PO TABS
650.0000 mg | ORAL_TABLET | Freq: Once | ORAL | Status: AC
  Administered 2023-07-23: 650 mg via ORAL
  Filled 2023-07-23: qty 2

## 2023-07-23 MED ORDER — ZOLEDRONIC ACID 4 MG/100ML IV SOLN
4.0000 mg | Freq: Once | INTRAVENOUS | Status: AC
  Administered 2023-07-23: 4 mg via INTRAVENOUS
  Filled 2023-07-23: qty 100

## 2023-07-23 MED ORDER — FAMOTIDINE IN NACL 20-0.9 MG/50ML-% IV SOLN
20.0000 mg | Freq: Once | INTRAVENOUS | Status: AC
  Administered 2023-07-23: 20 mg via INTRAVENOUS
  Filled 2023-07-23: qty 50

## 2023-07-23 MED ORDER — SODIUM CHLORIDE 0.9 % IV SOLN
7.5000 mg/kg | Freq: Once | INTRAVENOUS | Status: AC
  Administered 2023-07-23: 540 mg via INTRAVENOUS
  Filled 2023-07-23: qty 54

## 2023-07-23 MED ORDER — SODIUM CHLORIDE 0.9% FLUSH
10.0000 mL | INTRAVENOUS | Status: DC | PRN
  Administered 2023-07-23: 10 mL

## 2023-07-23 MED ORDER — HEPARIN SOD (PORK) LOCK FLUSH 100 UNIT/ML IV SOLN
500.0000 [IU] | Freq: Once | INTRAVENOUS | Status: AC | PRN
  Administered 2023-07-23: 500 [IU]

## 2023-07-23 MED ORDER — SODIUM CHLORIDE 0.9 % IV SOLN: INTRAVENOUS | Status: DC

## 2023-07-23 MED ORDER — PALONOSETRON HCL INJECTION 0.25 MG/5ML
0.2500 mg | Freq: Once | INTRAVENOUS | Status: AC
  Administered 2023-07-23: 0.25 mg via INTRAVENOUS
  Filled 2023-07-23: qty 5

## 2023-07-23 MED ORDER — DEXAMETHASONE SODIUM PHOSPHATE 10 MG/ML IJ SOLN
10.0000 mg | Freq: Once | INTRAMUSCULAR | Status: AC
  Administered 2023-07-23: 10 mg via INTRAVENOUS
  Filled 2023-07-23: qty 1

## 2023-07-23 MED ORDER — SODIUM CHLORIDE 0.9 % IV SOLN
150.0000 mg | Freq: Once | INTRAVENOUS | Status: AC
  Administered 2023-07-23: 150 mg via INTRAVENOUS
  Filled 2023-07-23: qty 150

## 2023-07-23 MED ORDER — SODIUM CHLORIDE 0.9% FLUSH
10.0000 mL | INTRAVENOUS | Status: AC
  Administered 2023-07-23: 10 mL

## 2023-07-23 MED ORDER — PROCHLORPERAZINE MALEATE 10 MG PO TABS
10.0000 mg | ORAL_TABLET | Freq: Four times a day (QID) | ORAL | 2 refills | Status: AC | PRN
Start: 2023-07-23 — End: ?

## 2023-07-23 MED ORDER — CETIRIZINE HCL 10 MG/ML IV SOLN
10.0000 mg | Freq: Once | INTRAVENOUS | Status: AC
  Administered 2023-07-23: 10 mg via INTRAVENOUS
  Filled 2023-07-23: qty 1

## 2023-07-23 NOTE — Progress Notes (Signed)
 Patients port flushed without difficulty.  Good blood return noted with no bruising or swelling noted at site.  Patient remains accessed for treatment.

## 2023-07-23 NOTE — Progress Notes (Signed)
 Patient has been examined by Dr. Cheree Cords. Vital signs and labs have been reviewed by MD - ANC, creatinine, LFTs, hemoglobin, and platelets are within treatment parameters per M.D. - pt may proceed with treatment.  Primary RN and pharmacy notified.

## 2023-07-23 NOTE — Progress Notes (Signed)
 Patient presents today for Trodelvy  and Zometa  4 mg IV infusion. Patient is in satisfactory condition with no new complaints voiced.  Vital signs are stable.  Labs reviewed by Dr. Cheree Cords during the office visit and all labs are within treatment parameters.  We will proceed with treatment per MD orders.   Patient denies any tooth or jaw pain at this time. No recent or future major dental appointments at this time. Patient reports taking Calcium and Vit D supplements as directed.  Treatment given today per MD orders. Tolerated infusion without adverse affects. Vital signs stable. No complaints at this time. Discharged from clinic ambulatory in stable condition. Alert and oriented x 3. F/U with [facility] as scheduled.

## 2023-07-23 NOTE — Progress Notes (Signed)
 [facility] [address], [place] [address]    Clinic Day:  07/23/2023  Referring physician: Minus Amel, MD  Patient Care Team: Minus Amel, MD as PCP - General (Family Medicine) Riley Cheadle, Windsor Hatcher, MD (Gastroenterology) Retta Caster, MD as Consulting Physician (Radiation Oncology) Dareen Ebbing, MD as Consulting Physician (General Surgery) Auther Bo, RN as Oncology Nurse Navigator Alane Hsu, RN as Oncology Nurse Navigator Paulett Boros, MD as Medical Oncologist (Medical Oncology) Gerhard Knuckles, RN as Oncology Nurse Navigator (Medical Oncology)   ASSESSMENT & PLAN:   Assessment: 1.  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  Noticed skin changes in the right breast 3 months ago. - Mammogram (06/19/2022): Large 8-9 cm mass in the right breast with 2 abnormal axillary lymph nodes and skin thickening. 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  1.8 cm ill-defined enhancing mass in the lateral left breast suspicious for additional malignancy.  Right breast mass involves right chest wall including pectoralis and intercostal muscles.  Prominent subcentimeter left axillary lymph nodes. - PET scan (07/12/2022): Intensely hypermetabolic right breast mass with right axillary lymph nodes.  Hypermetabolic metastatic lymph node in the posterior triangle of the right neck.  Hypermetabolic left axillary lymph nodes.  No evidence of metastatic adenopathy in the mediastinum/abdomen/pelvis.  No evidence of visceral metastasis.  Multiple skeletal metastasis:  Left sacral ala 2.5 cm, left iliac bone, L4 and T12 vertebral bodies, multiple rib lesions, left scapular lesion. - She was evaluated by Dr. Arno Bibles and was recommended treatment with ribociclib  and anastrozole . - Germline mutation testing: Negative. - Anastrozole  and ribociclib  started on 07/25/2022, discontinued on 12/13/2022 due to progression. - PET scan (11/15/2022): Mixed response to therapy with overall progression of disease with new bone metastasis and liver metastasis.  New periportal lymph nodes. - Liver biopsy (12/07/2022): Metastatic poorly differentiated adenocarcinoma consistent with breast primary.  ER +2% with moderate staining, PR 0%, HER2 (1+). - Guardant360: APC G1312, T p53, MYC amplification, FGF R2 amplification, EGFR amplification - PD-L1 (22 C3): 0% - Abraxane  day 1, day 8 every 21 days started on 12/21/2022, carboplatin  AUC on day 1 and day 8 added with cycle 2 on 01/10/2023, discontinued on 05/30/2023 due to progression. - NGS (12/07/2022): MS-stable, TMB-low, PTEN pathogenic variant, genomic LOH high (18%), HER2 (1+), PD-L1 (22 C3) CPS-10. - Sacituzumab cycle 1 started on 06/10/2023   2.  Social/family history: - She lives at home with her husband.  Used OCP for approximately 16-18 years.  Underwent hysterectomy after endometrial ablation when she was found to have fibroids causing pain.  No family history of breast or other malignancies.  Smokes 1 pack of cigarettes per day.    Plan: 1.  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  New increased hypermetabolic nodularity along the right breast cutaneous surface. - Cycle 2 of Sacituzumab on 07/02/2023.  We had to dose reduce on day 8 due to neutropenia.  We have also added G-CSF on day 9. - She reported tiredness for 2 to 3 days after  each treatment.  She had diarrhea more intense after G-CSF injection lasting about 3 days and Imodium helps.  She has  occasional nausea for which she takes Zofran /Compazine  which helped.  No vomiting reported.  Continue to use Mylanta as needed for indigestion. - We reviewed labs today: WBC and platelets are normal.  Hemoglobin is 8.5.  LFTs are normal.  Tumor marker CA 15-3 and CA 27-29 have trended down. - She may proceed with cycle 3 today with dose reduction of 7.5 mg/kg.  RTC 3 weeks for follow-up.  I will plan on scans after cycle 4.   2.  Bone metastasis: - Continue Zometa  every 12 weeks.  Calcium is 8.2 thousand and 3.1.  3.  Hypomagnesemia: - Continue magnesium  twice daily.  Magnesium  is normal.  4.  Leg swelling: - Continue Lasix  20 mg in the mornings daily as needed.     No orders of the defined types were placed in this encounter.     Monica Soto,acting as a Neurosurgeon for Paulett Boros, MD.,have documented all relevant documentation on the behalf of Paulett Boros, MD,as directed by  Paulett Boros, MD while in the presence of Paulett Boros, MD.  I, Paulett Boros MD, have reviewed the above documentation for accuracy and completeness, and I agree with the above.      Paulett Boros, MD   6/17/20259:45 AM  CHIEF COMPLAINT:   Diagnosis: metastatic inflammatory right breast cancer    Cancer Staging  Malignant neoplasm of upper-outer quadrant of right breast in female, estrogen receptor positive (HCC) Staging form: Breast, AJCC 8th Edition - Clinical: Stage IV (cT4, cN2, cM1, G2, ER+, PR+, HER2-) - Signed by Iruku, Praveena, MD on 07/17/2022    Prior Therapy: Carboplatin , Abraxane  and Keytruda   Current Therapy: Sacituzumab govitecan    HISTORY OF PRESENT ILLNESS:   Oncology History  Malignant neoplasm of upper-outer quadrant of right breast in female, estrogen receptor positive (HCC)  06/19/2022 Mammogram   Mammogram showed large mass in the right breast which appears to measure 8 to 9 cm on ultrasound, 2 abnormal right axillary lymph node  and a third borderline lymph node.  Skin thickening noted along the medial aspect of left breast which may be an extension of the marked skin thickening diffusely on the right.  Otherwise no left breast abnormalities are identified.   06/22/2022 Pathology Results   Needle core biopsy from the right breast upper outer quadrant showed grade 2 IDC, both lymph nodes with metastatic adenocarcinoma.  Prognostic showed ER 90% positive strong staining PR 70% positive strong staining Ki-67 of 20% HER2 negative by IHC 1+   07/03/2022 Initial Diagnosis   Malignant neoplasm of upper-outer quadrant of right breast in female, estrogen receptor positive (HCC)   07/04/2022 Cancer Staging   Staging form: Breast, AJCC 8th Edition - Clinical: Stage IV (cT4, cN2, cM1, G2, ER+, PR+, HER2-) - Signed by Murleen Arms, MD on 07/17/2022 Stage prefix: Initial diagnosis Histologic grading system: 3 grade system   07/16/2022 - 07/16/2022 Chemotherapy   Patient is on Treatment Plan : BREAST ADJUVANT DOSE DENSE AC q14d / PACLitaxel  q7d      Genetic Testing   [facility] Custom Panel+RNA was Negative. Report date is 07/10/2022.  The Custom Hereditary Cancers Panel offered by [facility] includes sequencing and/or deletion duplication testing of the following 43 genes: APC, ATM, AXIN2, BAP1, BARD1, BMPR1A, BRCA1, BRCA2, BRIP1, CDH1, CDK4, CDKN2A (p14ARF and p16INK4a only), CHEK2, CTNNA1, EPCAM (Deletion/duplication testing only), FH, GREM1 (promoter region duplication testing  only), HOXB13, KIT, MBD4, MEN1, MLH1, MSH2, MSH3, MSH6, MUTYH, NF1, NHTL1, PALB2, PDGFRA, PMS2, POLD1, POLE, PTEN, RAD51C, RAD51D, SMAD4, SMARCA4. STK11, TP53, TSC1, TSC2, and VHL.   12/13/2022 - 12/13/2022 Chemotherapy   Patient is on Treatment Plan : BREAST METASTATIC Fam-Trastuzumab Deruxtecan-nxki (Enhertu) (5.4) q21d     12/21/2022 - 05/23/2023 Chemotherapy   Patient is on Treatment Plan : BREAST Paclitaxel -Albumin (Abraxane ) (100) D1,8 + Carboplatin  AUC2 D1,8  every 3 weeks, q21d     06/10/2023 -  Chemotherapy   Patient is on Treatment Plan : BREAST Sacituzumab govitecan -hziy (Trodelvy ) D1,8 q21d        INTERVAL HISTORY:   Monica Soto is a 58 y.o. female presenting to clinic today for follow up of metastatic inflammatory right breast cancer. She was last seen by me on 07/02/23.  Today, she states that she is doing well overall. Her appetite level is at 75%. Her energy level is at 50%.   Zendaya is tolerating Sacituzumab well. She notes 2-3 days of tiredness after treatment, nausea, and mild diarrhea. Mylanta resolves previous GERD symptoms of indigestion from treatment. Imodium is effective in treating diarrhea and nausea is well controlled with compazine . She denies any vomiting.   She required lasix  on 07/20/23. She is taking magnesium  as prescribed.   Melaya reports 3 days of severe diarrhea after Zometa  injection and takes Imodium to improve symptoms.   PAST MEDICAL HISTORY:   Past Medical History: Past Medical History:  Diagnosis Date   Depression    Diabetes mellitus without complication (HCC)    Erosive esophagitis 04/26/2010   EGD by Dr. Riley Cheadle, small hiatal hernia   GERD (gastroesophageal reflux disease)    History of stomach ulcers    IBS (irritable bowel syndrome)    Diarrhea predominant   Microscopic colitis 04/26/2010   Colonoscopy by Dr. Riley Cheadle, good response with Entocort   Tubular adenoma of colon 04/26/2010   Junction of descending and sigmoid 40 CM from anus    Surgical History: Past Surgical History:  Procedure Laterality Date   ABDOMINAL HYSTERECTOMY     APPENDECTOMY  2/11   Dr. Tina Fordyce with a delayed closure   BREAST BIOPSY Right 06/22/2022   US  RT BREAST BX W LOC DEV 1ST LESION IMG BX SPEC US  GUIDE 06/22/2022 [facility] MAMMOGRAPHY   PORTACATH PLACEMENT N/A 07/16/2022   Procedure: INSERTION PORT-A-CATH;  Surgeon: Dareen Ebbing, MD;  Location: [facility];  Service: General;  Laterality: N/A;   TONSILLECTOMY      TUBAL LIGATION      Social History: Social History   Socioeconomic History   Marital status: Married    Spouse name: Not on file   Number of children: Not on file   Years of education: Not on file   Highest education level: Not on file  Occupational History    Employer: [employer]    Comment: Third shift  Tobacco Use   Smoking status: Every Day    Current packs/day: 0.50    Types: Cigarettes   Smokeless tobacco: Never  Vaping Use   Vaping status: Never Used  Substance and Sexual Activity   Alcohol use: Yes    Alcohol/week: 1.0 standard drink of alcohol    Types: 1 Glasses of wine per week    Comment: seldom   Drug use: No   Sexual activity: Yes    Birth control/protection: Surgical  Other Topics Concern   Not on file  Social History Narrative   Not on file  Social Drivers of Corporate investment banker Strain: Not on file  Food Insecurity: No Food Insecurity (07/04/2022)   Hunger Vital Sign    Worried About Running Out of Food in the Last Year: Never true    Ran Out of Food in the Last Year: Never true  Transportation Needs: No Transportation Needs (07/04/2022)   PRAPARE - Administrator, Civil Service (Medical): No    Lack of Transportation (Non-Medical): No  Physical Activity: Not on file  Stress: Not on file  Social Connections: Not on file  Intimate Partner Violence: Not on file    Family History: Family History  Problem Relation Age of Onset   Diabetes Mother    Coronary artery disease Mother    Healthy Father     Current Medications:  Current Outpatient Medications:    amoxicillin -clavulanate (AUGMENTIN ) 875-125 MG tablet, Take 1 tablet by mouth 2 (two) times daily., Disp: 14 tablet, Rfl: 0   Aspirin-Acetaminophen -Caffeine (GOODY HEADACHE PO), Take 1 packet by mouth 2 (two) times daily as needed (for pain)., Disp: , Rfl:    Cholecalciferol 1.25 MG (50000 UT) capsule, Take 1 capsule by mouth once a week., Disp: , Rfl:     diphenoxylate -atropine  (LOMOTIL ) 2.5-0.025 MG tablet, Take 2 tablets after first loose stool and 1 after each loose stool thereafter.  Do not exceed 8 tablets in 1 day., Disp: 30 tablet, Rfl: 3   fluticasone  (FLONASE ) 50 MCG/ACT nasal spray, Place 1 spray into both nostrils daily., Disp: 16 g, Rfl: 0   furosemide  (LASIX ) 20 MG tablet, Take 1 tablet (20 mg total) by mouth daily as needed for edema., Disp: 30 tablet, Rfl: 2   HYDROcodone -acetaminophen  (NORCO/VICODIN) 5-325 MG tablet, Take 1 tablet by mouth every 6 (six) hours as needed for moderate pain., Disp: 15 tablet, Rfl: 0   lidocaine -prilocaine  (EMLA ) cream, Apply to affected area once, Disp: 30 g, Rfl: 3   magnesium  oxide (MAG-OX) 400 (240 Mg) MG tablet, Take 1 tablet (400 mg total) by mouth 2 (two) times daily., Disp: 60 tablet, Rfl: 3   ondansetron  (ZOFRAN ) 8 MG tablet, TAKE 1 TABLET(8 MG) BY MOUTH EVERY 8 HOURS AS NEEDED FOR NAUSEA OR VOMITING. START THIRD DAY AFTER DOXORUBICIN/ CYCLOPHOSPHAMIDE CHEMOTHERAPY, Disp: 30 tablet, Rfl: 1   prochlorperazine  (COMPAZINE ) 10 MG tablet, Take 1 tablet (10 mg total) by mouth every 6 (six) hours as needed for nausea or vomiting., Disp: 30 tablet, Rfl: 2   Allergies: No Known Allergies  REVIEW OF SYSTEMS:   Review of Systems  Constitutional:  Negative for chills, fatigue and fever.  HENT:   Negative for lump/mass, mouth sores, nosebleeds, sore throat and trouble swallowing.   Eyes:  Negative for eye problems.  Respiratory:  Positive for cough. Negative for shortness of breath.   Cardiovascular:  Negative for chest pain, leg swelling and palpitations.  Gastrointestinal:  Positive for diarrhea and nausea. Negative for abdominal pain, constipation and vomiting.  Genitourinary:  Negative for bladder incontinence, difficulty urinating, dysuria, frequency, hematuria and nocturia.   Musculoskeletal:  Negative for arthralgias, back pain, flank pain, myalgias and neck pain.  Skin:  Negative for itching and  rash.  Neurological:  Positive for headaches and numbness (in tips of toes). Negative for dizziness.  Hematological:  Does not bruise/bleed easily.  Psychiatric/Behavioral:  Negative for depression, sleep disturbance and suicidal ideas. The patient is not nervous/anxious.   All other systems reviewed and are negative.    VITALS:   There were no  vitals taken for this visit.  Wt Readings from Last 3 Encounters:  07/23/23 155 lb 14.4 oz (70.7 kg)  07/09/23 157 lb 6.4 oz (71.4 kg)  07/02/23 156 lb 15.5 oz (71.2 kg)    There is no height or weight on file to calculate BMI.  Performance status (ECOG): 1 - Symptomatic but completely ambulatory   PHYSICAL EXAM:   Physical Exam Vitals and nursing note reviewed. Exam conducted with a chaperone present.  Constitutional:      Appearance: Normal appearance.   Cardiovascular:     Rate and Rhythm: Normal rate and regular rhythm.     Pulses: Normal pulses.     Heart sounds: Normal heart sounds.  Pulmonary:     Effort: Pulmonary effort is normal.     Breath sounds: Normal breath sounds.  Abdominal:     Palpations: Abdomen is soft. There is no hepatomegaly, splenomegaly or mass.     Tenderness: There is no abdominal tenderness.   Musculoskeletal:     Right lower leg: No edema.     Left lower leg: No edema.  Lymphadenopathy:     Cervical: No cervical adenopathy.     Right cervical: No superficial, deep or posterior cervical adenopathy.    Left cervical: No superficial, deep or posterior cervical adenopathy.     Upper Body:     Right upper body: No supraclavicular or axillary adenopathy.     Left upper body: No supraclavicular or axillary adenopathy.   Neurological:     General: No focal deficit present.     Mental Status: She is alert and oriented to person, place, and time.   Psychiatric:        Mood and Affect: Mood normal.        Behavior: Behavior normal.     LABS:      Latest Ref Rng & Units 07/23/2023    8:56 AM  07/10/2023    9:21 AM 07/09/2023    8:10 AM  CBC  WBC 4.0 - 10.5 K/uL 8.5  6.2  2.3   Hemoglobin 12.0 - 15.0 g/dL 8.5  8.6  8.7   Hematocrit 36.0 - 46.0 % 27.5  27.1  27.7   Platelets 150 - 400 K/uL 180  168  174       Latest Ref Rng & Units 07/23/2023    8:56 AM 07/10/2023    9:21 AM 07/09/2023    8:10 AM  CMP  Glucose 70 - 99 mg/dL 914  782  956   BUN 6 - 20 mg/dL 7  7  9    Creatinine 0.44 - 1.00 mg/dL 2.13  0.86  5.78   Sodium 135 - 145 mmol/L 139  136  133   Potassium 3.5 - 5.1 mmol/L 3.6  3.3  3.1   Chloride 98 - 111 mmol/L 107  108  104   CO2 22 - 32 mmol/L 23  21  22    Calcium 8.9 - 10.3 mg/dL 8.2  8.2  8.3   Total Protein 6.5 - 8.1 g/dL 6.6  6.5  6.7   Total Bilirubin 0.0 - 1.2 mg/dL 0.4  0.4  0.5   Alkaline Phos 38 - 126 U/L 249  211  205   AST 15 - 41 U/L 15  17  17    ALT 0 - 44 U/L 11  13  15       No results found for: CEA1, CEA / No results found for: CEA1, CEA No results found for:  PSA1 No results found for: ZOX096 No results found for: CAN125  No results found for: TOTALPROTELP, ALBUMINELP, A1GS, A2GS, BETS, BETA2SER, GAMS, MSPIKE, SPEI No results found for: TIBC, FERRITIN, IRONPCTSAT No results found for: LDH   STUDIES:   No results found.

## 2023-07-23 NOTE — Progress Notes (Signed)
 Corrected calcium:  8.92  Augie Bliss, PharmD

## 2023-07-23 NOTE — Patient Instructions (Signed)
 [facility] [place] - A DEPT OF [facility]  Discharge Instructions: Thank you for choosing [facility] to provide your oncology and hematology care.  If you have a lab appointment with the Cancer Center - please note that after April 8th, 2024, all labs will be drawn in the cancer center.  You do not have to check in or register with the main entrance as you have in the past but will complete your check-in in the cancer center.  Wear comfortable clothing and clothing appropriate for easy access to any Portacath or PICC line.   We strive to give you quality time with your provider. You may need to reschedule your appointment if you arrive late (15 or more minutes).  Arriving late affects you and other patients whose appointments are after yours.  Also, if you miss three or more appointments without notifying the office, you may be dismissed from the clinic at the provider's discretion.      For prescription refill requests, have your pharmacy contact our office and allow 72 hours for refills to be completed.    Today you received the following chemotherapy and/or immunotherapy agents Trodelvy  and Zometa  4 mg IV infusion.   To help prevent nausea and vomiting after your treatment, we encourage you to take your nausea medication as directed.  BELOW ARE SYMPTOMS THAT SHOULD BE REPORTED IMMEDIATELY: *FEVER GREATER THAN 100.4 F (38 C) OR HIGHER *CHILLS OR SWEATING *NAUSEA AND VOMITING THAT IS NOT CONTROLLED WITH YOUR NAUSEA MEDICATION *UNUSUAL SHORTNESS OF BREATH *UNUSUAL BRUISING OR BLEEDING *URINARY PROBLEMS (pain or burning when urinating, or frequent urination) *BOWEL PROBLEMS (unusual diarrhea, constipation, pain near the anus) TENDERNESS IN MOUTH AND THROAT WITH OR WITHOUT PRESENCE OF ULCERS (sore throat, sores in mouth, or a toothache) UNUSUAL RASH, SWELLING OR PAIN  UNUSUAL VAGINAL DISCHARGE OR ITCHING   Items with * indicate a potential emergency  and should be followed up as soon as possible or go to the Emergency Department if any problems should occur.  Please show the CHEMOTHERAPY ALERT CARD or IMMUNOTHERAPY ALERT CARD at check-in to the Emergency Department and triage nurse.  Should you have questions after your visit or need to cancel or reschedule your appointment, please contact [facility] [place] - A DEPT OF [facility] [phone]  and follow the prompts.  Office hours are 8:00 a.m. to 4:30 p.m. Monday - Friday. Please note that voicemails left after 4:00 p.m. may not be returned until the following business day.  We are closed weekends and major holidays. You have access to a nurse at all times for urgent questions. Please call the main number to the clinic [phone] and follow the prompts.  For any non-urgent questions, you may also contact your provider using MyChart. We now offer e-Visits for anyone 51 and older to request care online for non-urgent symptoms. For details visit [email or web address].   Also download the MyChart app! Go to the app store, search MyChart, open the app, select , and log in with your MyChart username and password.

## 2023-07-23 NOTE — Patient Instructions (Signed)

## 2023-07-24 ENCOUNTER — Other Ambulatory Visit: Payer: Self-pay | Admitting: Hematology and Oncology

## 2023-07-24 DIAGNOSIS — C50411 Malignant neoplasm of upper-outer quadrant of right female breast: Secondary | ICD-10-CM

## 2023-07-29 ENCOUNTER — Encounter: Payer: Self-pay | Admitting: Hematology

## 2023-07-30 ENCOUNTER — Inpatient Hospital Stay

## 2023-07-30 ENCOUNTER — Inpatient Hospital Stay: Admitting: Hematology

## 2023-07-30 VITALS — BP 125/67 | HR 92 | Temp 98.9°F | Resp 18

## 2023-07-30 VITALS — BP 139/76 | HR 92 | Temp 98.0°F | Resp 18

## 2023-07-30 DIAGNOSIS — Z170 Estrogen receptor positive status [ER+]: Secondary | ICD-10-CM

## 2023-07-30 DIAGNOSIS — Z5112 Encounter for antineoplastic immunotherapy: Secondary | ICD-10-CM | POA: Diagnosis not present

## 2023-07-30 DIAGNOSIS — Z95828 Presence of other vascular implants and grafts: Secondary | ICD-10-CM

## 2023-07-30 LAB — COMPREHENSIVE METABOLIC PANEL WITH GFR
ALT: 12 U/L (ref 0–44)
AST: 17 U/L (ref 15–41)
Albumin: 3.2 g/dL — ABNORMAL LOW (ref 3.5–5.0)
Alkaline Phosphatase: 206 U/L — ABNORMAL HIGH (ref 38–126)
Anion gap: 11 (ref 5–15)
BUN: 8 mg/dL (ref 6–20)
CO2: 21 mmol/L — ABNORMAL LOW (ref 22–32)
Calcium: 8.3 mg/dL — ABNORMAL LOW (ref 8.9–10.3)
Chloride: 105 mmol/L (ref 98–111)
Creatinine, Ser: 0.5 mg/dL (ref 0.44–1.00)
GFR, Estimated: >60 mL/min (ref >60)
Glucose, Bld: 113 mg/dL — ABNORMAL HIGH (ref 70–99)
Potassium: 3.7 mmol/L (ref 3.5–5.1)
Sodium: 137 mmol/L (ref 135–145)
Total Bilirubin: 0.3 mg/dL (ref 0.0–1.2)
Total Protein: 6.8 g/dL (ref 6.5–8.1)

## 2023-07-30 LAB — CBC WITH DIFFERENTIAL/PLATELET
Abs Immature Granulocytes: 0.07 10*3/uL (ref 0.00–0.07)
Basophils Absolute: 0 10*3/uL (ref 0.0–0.1)
Basophils Relative: 1 %
Eosinophils Absolute: 0.1 10*3/uL (ref 0.0–0.5)
Eosinophils Relative: 1 %
HCT: 27.3 % — ABNORMAL LOW (ref 36.0–46.0)
Hemoglobin: 8.8 g/dL — ABNORMAL LOW (ref 12.0–15.0)
Immature Granulocytes: 2 %
Lymphocytes Relative: 27 %
Lymphs Abs: 1.1 10*3/uL (ref 0.7–4.0)
MCH: 32.7 pg (ref 26.0–34.0)
MCHC: 32.2 g/dL (ref 30.0–36.0)
MCV: 101.5 fL — ABNORMAL HIGH (ref 80.0–100.0)
Monocytes Absolute: 0.5 10*3/uL (ref 0.1–1.0)
Monocytes Relative: 11 %
Neutro Abs: 2.4 10*3/uL (ref 1.7–7.7)
Neutrophils Relative %: 58 %
Platelets: 186 10*3/uL (ref 150–400)
RBC: 2.69 MIL/uL — ABNORMAL LOW (ref 3.87–5.11)
RDW: 20.3 % — ABNORMAL HIGH (ref 11.5–15.5)
WBC: 4.2 10*3/uL (ref 4.0–10.5)
nRBC: 0.7 % — ABNORMAL HIGH (ref 0.0–0.2)

## 2023-07-30 LAB — MAGNESIUM: Magnesium: 1.9 mg/dL (ref 1.7–2.4)

## 2023-07-30 LAB — PHOSPHORUS: Phosphorus: 2.5 mg/dL (ref 2.5–4.6)

## 2023-07-30 MED ORDER — ACETAMINOPHEN 325 MG PO TABS
650.0000 mg | ORAL_TABLET | Freq: Once | ORAL | Status: AC
  Administered 2023-07-30: 650 mg via ORAL
  Filled 2023-07-30: qty 2

## 2023-07-30 MED ORDER — DEXAMETHASONE SODIUM PHOSPHATE 10 MG/ML IJ SOLN
10.0000 mg | Freq: Once | INTRAMUSCULAR | Status: AC
  Administered 2023-07-30: 10 mg via INTRAVENOUS
  Filled 2023-07-30: qty 1

## 2023-07-30 MED ORDER — SODIUM CHLORIDE 0.9% FLUSH
10.0000 mL | INTRAVENOUS | Status: DC | PRN
  Administered 2023-07-30: 10 mL via INTRAVENOUS

## 2023-07-30 MED ORDER — HEPARIN SOD (PORK) LOCK FLUSH 100 UNIT/ML IV SOLN
500.0000 [IU] | Freq: Once | INTRAVENOUS | Status: AC | PRN
  Administered 2023-07-30: 500 [IU]

## 2023-07-30 MED ORDER — ATROPINE SULFATE 1 MG/ML IV SOLN
0.5000 mg | Freq: Once | INTRAVENOUS | Status: AC | PRN
  Administered 2023-07-30: 0.5 mg via INTRAVENOUS
  Filled 2023-07-30: qty 1

## 2023-07-30 MED ORDER — SODIUM CHLORIDE 0.9 % IV SOLN
7.5000 mg/kg | Freq: Once | INTRAVENOUS | Status: AC
  Administered 2023-07-30: 540 mg via INTRAVENOUS
  Filled 2023-07-30: qty 54

## 2023-07-30 MED ORDER — PALONOSETRON HCL INJECTION 0.25 MG/5ML
0.2500 mg | Freq: Once | INTRAVENOUS | Status: AC
  Administered 2023-07-30: 0.25 mg via INTRAVENOUS
  Filled 2023-07-30: qty 5

## 2023-07-30 MED ORDER — SODIUM CHLORIDE 0.9 % IV SOLN
INTRAVENOUS | Status: DC
Start: 2023-07-30 — End: 2023-07-30

## 2023-07-30 MED ORDER — FAMOTIDINE IN NACL 20-0.9 MG/50ML-% IV SOLN
20.0000 mg | Freq: Once | INTRAVENOUS | Status: AC
  Administered 2023-07-30: 20 mg via INTRAVENOUS
  Filled 2023-07-30: qty 50

## 2023-07-30 MED ORDER — CETIRIZINE HCL 10 MG/ML IV SOLN
10.0000 mg | Freq: Once | INTRAVENOUS | Status: AC
  Administered 2023-07-30: 10 mg via INTRAVENOUS
  Filled 2023-07-30: qty 1

## 2023-07-30 MED ORDER — SODIUM CHLORIDE 0.9 % IV SOLN
150.0000 mg | Freq: Once | INTRAVENOUS | Status: AC
  Administered 2023-07-30: 150 mg via INTRAVENOUS
  Filled 2023-07-30: qty 150

## 2023-07-30 NOTE — Progress Notes (Signed)
 Patients port flushed without difficulty.  Good blood return noted with no bruising or swelling noted at site.  Patient remains accessed for treatment.

## 2023-07-30 NOTE — Patient Instructions (Addendum)
 [facility] [place] - A DEPT OF [facility]  Discharge Instructions: Thank you for choosing [facility] to provide your oncology and hematology care.  If you have a lab appointment with the Cancer Center - please note that after April 8th, 2024, all labs will be drawn in the cancer center.  You do not have to check in or register with the main entrance as you have in the past but will complete your check-in in the cancer center.  Wear comfortable clothing and clothing appropriate for easy access to any Portacath or PICC line.   We strive to give you quality time with your provider. You may need to reschedule your appointment if you arrive late (15 or more minutes).  Arriving late affects you and other patients whose appointments are after yours.  Also, if you miss three or more appointments without notifying the office, you may be dismissed from the clinic at the provider's discretion.      For prescription refill requests, have your pharmacy contact our office and allow 72 hours for refills to be completed.    Today you received the following chemotherapy and/or immunotherapy agents Chaya Cord   To help prevent nausea and vomiting after your treatment, we encourage you to take your nausea medication as directed.  BELOW ARE SYMPTOMS THAT SHOULD BE REPORTED IMMEDIATELY: *FEVER GREATER THAN 100.4 F (38 C) OR HIGHER *CHILLS OR SWEATING *NAUSEA AND VOMITING THAT IS NOT CONTROLLED WITH YOUR NAUSEA MEDICATION *UNUSUAL SHORTNESS OF BREATH *UNUSUAL BRUISING OR BLEEDING *URINARY PROBLEMS (pain or burning when urinating, or frequent urination) *BOWEL PROBLEMS (unusual diarrhea, constipation, pain near the anus) TENDERNESS IN MOUTH AND THROAT WITH OR WITHOUT PRESENCE OF ULCERS (sore throat, sores in mouth, or a toothache) UNUSUAL RASH, SWELLING OR PAIN  UNUSUAL VAGINAL DISCHARGE OR ITCHING   Items with * indicate a potential emergency and should be followed up as  soon as possible or go to the Emergency Department if any problems should occur.  Please show the CHEMOTHERAPY ALERT CARD or IMMUNOTHERAPY ALERT CARD at check-in to the Emergency Department and triage nurse.  Should you have questions after your visit or need to cancel or reschedule your appointment, please contact [facility] [place] - A DEPT OF [facility] [phone]  and follow the prompts.  Office hours are 8:00 a.m. to 4:30 p.m. Monday - Friday. Please note that voicemails left after 4:00 p.m. may not be returned until the following business day.  We are closed weekends and major holidays. You have access to a nurse at all times for urgent questions. Please call the main number to the clinic [phone] and follow the prompts.  For any non-urgent questions, you may also contact your provider using MyChart. We now offer e-Visits for anyone 15 and older to request care online for non-urgent symptoms. For details visit [email or web address].   Also download the MyChart app! Go to the app store, search "MyChart", open the app, select [facility], and log in with your MyChart username and password.

## 2023-07-30 NOTE — Progress Notes (Signed)
 Per pt she is having diarrhea a few days after her treatment but subsides with imodium. MD and treatment team made aware. No new orders given at this time. RN will proceed with atropine  prior to Trodelvy  treatment. Pt updated and agrees to plan.    Patient tolerated chemotherapy with no complaints voiced.  Side effects with management reviewed with understanding verbalized.  Port site clean and dry with no bruising or swelling noted at site.  Good blood return noted before and after administration of chemotherapy.  Band aid applied.  Patient left in satisfactory condition with VSS and no s/s of distress noted. All follow ups as scheduled.   Sherril Heyward

## 2023-07-31 ENCOUNTER — Other Ambulatory Visit: Payer: Self-pay | Admitting: Hematology

## 2023-07-31 ENCOUNTER — Inpatient Hospital Stay

## 2023-07-31 VITALS — BP 114/71 | HR 74 | Temp 97.6°F | Resp 18

## 2023-07-31 DIAGNOSIS — Z5112 Encounter for antineoplastic immunotherapy: Secondary | ICD-10-CM | POA: Diagnosis not present

## 2023-07-31 DIAGNOSIS — C50411 Malignant neoplasm of upper-outer quadrant of right female breast: Secondary | ICD-10-CM

## 2023-07-31 MED ORDER — PEGFILGRASTIM-CBQV 6 MG/0.6ML ~~LOC~~ SOSY
6.0000 mg | PREFILLED_SYRINGE | Freq: Once | SUBCUTANEOUS | Status: AC
  Administered 2023-07-31: 6 mg via SUBCUTANEOUS
  Filled 2023-07-31: qty 0.6

## 2023-07-31 NOTE — Patient Instructions (Signed)

## 2023-07-31 NOTE — Progress Notes (Signed)
 Patient tolerated injection with no complaints voiced.  Site clean and dry with no bruising or swelling noted at site.  See MAR for details.  Band aid applied.  Patient stable during and after injection.  Vss with discharge and left in satisfactory condition with no s/s of distress noted.

## 2023-08-01 ENCOUNTER — Encounter (HOSPITAL_COMMUNITY): Payer: Self-pay | Admitting: Hematology

## 2023-08-01 ENCOUNTER — Encounter: Payer: Self-pay | Admitting: Hematology

## 2023-08-01 NOTE — Progress Notes (Signed)
 Reason for Visit: Chaplain making rounds on floor visiting infusion Pts   Description of Visit: Arriving in the room I found Monica Soto in a recliner chair waiting to receive treatment. I introduced myself as the new chaplain for the cancer center and she was willing to talk with me.   Monica Soto is a [employer] claims [employer] native and shared with me the many connections she has in the community, and we discovered we knew many of the same people.  This was a pleasant discovery for us  to connect over.   I encouraged Monica Soto to share her cancer story with me and listened with compassion and empathy.  We further engaged in life review and she shared about her adult children.  Monica Soto states that she has a lot of anxiety regarding her children, fearing things that are out of her control.  In exploring her fears, I was not able to determine whether her anxiety is something related to her cancer and the anticipatory grief it causes, or whether it pre-dates her cancer and has deeper roots.   I explored Monica Soto's support community and coping skills.  She states that she has a big support community is her family (husband especially) and her life-long friends in the community.    Plan of Care: I will look to connect with Monica Soto again at her next appointment and possibly explore her anxiety a little deeper    Chaplain Maude Roll, MDiv  [email or web address] [phone]

## 2023-08-07 ENCOUNTER — Inpatient Hospital Stay: Admitting: Hematology

## 2023-08-07 ENCOUNTER — Inpatient Hospital Stay

## 2023-08-07 ENCOUNTER — Inpatient Hospital Stay: Attending: Hematology and Oncology

## 2023-08-10 ENCOUNTER — Other Ambulatory Visit: Payer: Self-pay | Admitting: Hematology and Oncology

## 2023-08-10 DIAGNOSIS — C50411 Malignant neoplasm of upper-outer quadrant of right female breast: Secondary | ICD-10-CM

## 2023-08-13 ENCOUNTER — Inpatient Hospital Stay (HOSPITAL_BASED_OUTPATIENT_CLINIC_OR_DEPARTMENT_OTHER): Admitting: Hematology

## 2023-08-13 ENCOUNTER — Inpatient Hospital Stay: Attending: Hematology and Oncology

## 2023-08-13 ENCOUNTER — Encounter (HOSPITAL_COMMUNITY): Payer: Self-pay | Admitting: Hematology

## 2023-08-13 ENCOUNTER — Encounter: Payer: Self-pay | Admitting: Hematology

## 2023-08-13 ENCOUNTER — Inpatient Hospital Stay

## 2023-08-13 VITALS — BP 114/65 | HR 90 | Temp 97.0°F | Resp 18

## 2023-08-13 DIAGNOSIS — R197 Diarrhea, unspecified: Secondary | ICD-10-CM | POA: Insufficient documentation

## 2023-08-13 DIAGNOSIS — Z1721 Progesterone receptor positive status: Secondary | ICD-10-CM | POA: Diagnosis not present

## 2023-08-13 DIAGNOSIS — Z170 Estrogen receptor positive status [ER+]: Secondary | ICD-10-CM | POA: Diagnosis not present

## 2023-08-13 DIAGNOSIS — R519 Headache, unspecified: Secondary | ICD-10-CM | POA: Insufficient documentation

## 2023-08-13 DIAGNOSIS — E876 Hypokalemia: Secondary | ICD-10-CM | POA: Diagnosis not present

## 2023-08-13 DIAGNOSIS — E119 Type 2 diabetes mellitus without complications: Secondary | ICD-10-CM | POA: Diagnosis not present

## 2023-08-13 DIAGNOSIS — C7951 Secondary malignant neoplasm of bone: Secondary | ICD-10-CM | POA: Diagnosis not present

## 2023-08-13 DIAGNOSIS — C787 Secondary malignant neoplasm of liver and intrahepatic bile duct: Secondary | ICD-10-CM | POA: Diagnosis not present

## 2023-08-13 DIAGNOSIS — Z5189 Encounter for other specified aftercare: Secondary | ICD-10-CM | POA: Insufficient documentation

## 2023-08-13 DIAGNOSIS — Z5112 Encounter for antineoplastic immunotherapy: Secondary | ICD-10-CM | POA: Insufficient documentation

## 2023-08-13 DIAGNOSIS — Z860101 Personal history of adenomatous and serrated colon polyps: Secondary | ICD-10-CM | POA: Insufficient documentation

## 2023-08-13 DIAGNOSIS — Z79899 Other long term (current) drug therapy: Secondary | ICD-10-CM | POA: Diagnosis not present

## 2023-08-13 DIAGNOSIS — F17210 Nicotine dependence, cigarettes, uncomplicated: Secondary | ICD-10-CM | POA: Insufficient documentation

## 2023-08-13 DIAGNOSIS — M549 Dorsalgia, unspecified: Secondary | ICD-10-CM | POA: Insufficient documentation

## 2023-08-13 DIAGNOSIS — Z90710 Acquired absence of both cervix and uterus: Secondary | ICD-10-CM | POA: Diagnosis not present

## 2023-08-13 DIAGNOSIS — Z833 Family history of diabetes mellitus: Secondary | ICD-10-CM | POA: Diagnosis not present

## 2023-08-13 DIAGNOSIS — Z9049 Acquired absence of other specified parts of digestive tract: Secondary | ICD-10-CM | POA: Diagnosis not present

## 2023-08-13 DIAGNOSIS — Z8249 Family history of ischemic heart disease and other diseases of the circulatory system: Secondary | ICD-10-CM | POA: Diagnosis not present

## 2023-08-13 DIAGNOSIS — C50411 Malignant neoplasm of upper-outer quadrant of right female breast: Secondary | ICD-10-CM

## 2023-08-13 DIAGNOSIS — N6320 Unspecified lump in the left breast, unspecified quadrant: Secondary | ICD-10-CM | POA: Insufficient documentation

## 2023-08-13 DIAGNOSIS — Z1732 Human epidermal growth factor receptor 2 negative status: Secondary | ICD-10-CM | POA: Diagnosis not present

## 2023-08-13 DIAGNOSIS — M533 Sacrococcygeal disorders, not elsewhere classified: Secondary | ICD-10-CM | POA: Insufficient documentation

## 2023-08-13 DIAGNOSIS — M7989 Other specified soft tissue disorders: Secondary | ICD-10-CM | POA: Diagnosis not present

## 2023-08-13 LAB — COMPREHENSIVE METABOLIC PANEL WITH GFR
ALT: 10 U/L (ref 0–44)
AST: 16 U/L (ref 15–41)
Albumin: 3.1 g/dL — ABNORMAL LOW (ref 3.5–5.0)
Alkaline Phosphatase: 188 U/L — ABNORMAL HIGH (ref 38–126)
Anion gap: 8 (ref 5–15)
BUN: 7 mg/dL (ref 6–20)
CO2: 21 mmol/L — ABNORMAL LOW (ref 22–32)
Calcium: 8 mg/dL — ABNORMAL LOW (ref 8.9–10.3)
Chloride: 109 mmol/L (ref 98–111)
Creatinine, Ser: 0.56 mg/dL (ref 0.44–1.00)
GFR, Estimated: >60 mL/min (ref >60)
Glucose, Bld: 137 mg/dL — ABNORMAL HIGH (ref 70–99)
Potassium: 3.6 mmol/L (ref 3.5–5.1)
Sodium: 138 mmol/L (ref 135–145)
Total Bilirubin: 0.5 mg/dL (ref 0.0–1.2)
Total Protein: 6.6 g/dL (ref 6.5–8.1)

## 2023-08-13 LAB — CBC WITH DIFFERENTIAL/PLATELET
Abs Immature Granulocytes: 0.1 K/uL — ABNORMAL HIGH (ref 0.00–0.07)
Basophils Absolute: 0 K/uL (ref 0.0–0.1)
Basophils Relative: 0 %
Eosinophils Absolute: 0 K/uL (ref 0.0–0.5)
Eosinophils Relative: 1 %
HCT: 26.6 % — ABNORMAL LOW (ref 36.0–46.0)
Hemoglobin: 8.5 g/dL — ABNORMAL LOW (ref 12.0–15.0)
Immature Granulocytes: 1 %
Lymphocytes Relative: 14 %
Lymphs Abs: 1.1 K/uL (ref 0.7–4.0)
MCH: 32.9 pg (ref 26.0–34.0)
MCHC: 32 g/dL (ref 30.0–36.0)
MCV: 103.1 fL — ABNORMAL HIGH (ref 80.0–100.0)
Monocytes Absolute: 0.4 K/uL (ref 0.1–1.0)
Monocytes Relative: 5 %
Neutro Abs: 6.2 K/uL (ref 1.7–7.7)
Neutrophils Relative %: 79 %
Platelets: 166 K/uL (ref 150–400)
RBC: 2.58 MIL/uL — ABNORMAL LOW (ref 3.87–5.11)
RDW: 20.4 % — ABNORMAL HIGH (ref 11.5–15.5)
WBC: 7.9 K/uL (ref 4.0–10.5)
nRBC: 0.3 % — ABNORMAL HIGH (ref 0.0–0.2)

## 2023-08-13 LAB — PHOSPHORUS: Phosphorus: 2.7 mg/dL (ref 2.5–4.6)

## 2023-08-13 LAB — MAGNESIUM: Magnesium: 1.9 mg/dL (ref 1.7–2.4)

## 2023-08-13 MED ORDER — ATROPINE SULFATE 1 MG/ML IV SOLN
0.5000 mg | Freq: Once | INTRAVENOUS | Status: AC | PRN
  Administered 2023-08-13: 0.5 mg via INTRAVENOUS
  Filled 2023-08-13: qty 1

## 2023-08-13 MED ORDER — SODIUM CHLORIDE 0.9 % IV SOLN
7.5000 mg/kg | Freq: Once | INTRAVENOUS | Status: DC
  Filled 2023-08-13: qty 54

## 2023-08-13 MED ORDER — DEXAMETHASONE SODIUM PHOSPHATE 10 MG/ML IJ SOLN
10.0000 mg | Freq: Once | INTRAMUSCULAR | Status: AC
  Administered 2023-08-13: 10 mg via INTRAVENOUS
  Filled 2023-08-13: qty 1

## 2023-08-13 MED ORDER — ACETAMINOPHEN 325 MG PO TABS
650.0000 mg | ORAL_TABLET | Freq: Once | ORAL | Status: AC
  Administered 2023-08-13: 650 mg via ORAL
  Filled 2023-08-13: qty 2

## 2023-08-13 MED ORDER — SODIUM CHLORIDE 0.9% FLUSH
10.0000 mL | INTRAVENOUS | Status: AC
  Administered 2023-08-13: 10 mL

## 2023-08-13 MED ORDER — ONDANSETRON HCL 8 MG PO TABS: ORAL_TABLET | ORAL | 2 refills | Status: AC

## 2023-08-13 MED ORDER — FAMOTIDINE IN NACL 20-0.9 MG/50ML-% IV SOLN
20.0000 mg | Freq: Once | INTRAVENOUS | Status: AC
  Administered 2023-08-13: 20 mg via INTRAVENOUS
  Filled 2023-08-13: qty 50

## 2023-08-13 MED ORDER — PALONOSETRON HCL INJECTION 0.25 MG/5ML
0.2500 mg | Freq: Once | INTRAVENOUS | Status: AC
  Administered 2023-08-13: 0.25 mg via INTRAVENOUS
  Filled 2023-08-13: qty 5

## 2023-08-13 MED ORDER — SODIUM CHLORIDE 0.9 % IV SOLN: INTRAVENOUS | Status: DC

## 2023-08-13 MED ORDER — HEPARIN SOD (PORK) LOCK FLUSH 100 UNIT/ML IV SOLN
500.0000 [IU] | Freq: Once | INTRAVENOUS | Status: AC | PRN
  Administered 2023-08-13: 500 [IU]

## 2023-08-13 MED ORDER — SODIUM CHLORIDE 0.9 % IV SOLN
7.5000 mg/kg | Freq: Once | INTRAVENOUS | Status: AC
  Administered 2023-08-13: 540 mg via INTRAVENOUS
  Filled 2023-08-13: qty 54

## 2023-08-13 MED ORDER — CETIRIZINE HCL 10 MG/ML IV SOLN
10.0000 mg | Freq: Once | INTRAVENOUS | Status: AC
  Administered 2023-08-13: 10 mg via INTRAVENOUS
  Filled 2023-08-13: qty 1

## 2023-08-13 MED ORDER — SODIUM CHLORIDE 0.9 % IV SOLN
150.0000 mg | Freq: Once | INTRAVENOUS | Status: AC
  Administered 2023-08-13: 150 mg via INTRAVENOUS
  Filled 2023-08-13: qty 150

## 2023-08-13 NOTE — Progress Notes (Signed)
   08/13/23 1500  Spiritual Encounters  Type of Visit Follow up  Care provided to: Patient  Referral source Chaplain assessment  Reason for visit Routine spiritual support  Spiritual Framework  Presenting Themes Meaning/purpose/sources of inspiration;Goals in life/care;Values and beliefs;Significant life change;Caregiving needs;Coping tools;Impactful experiences and emotions;Courage hope and growth  Community/Connection Family;Friend(s);Faith community  Interventions  Spiritual Care Interventions Made Reflective listening;Compassionate presence;Narrative/life review;Explored values/beliefs/practices/strengths;Encouragement  Intervention Outcomes  Outcomes Awareness around self/spiritual resourses;Awareness of support;Reduced anxiety  Spiritual Care Plan  Spiritual Care Issues Still Outstanding Chaplain will continue to follow   Reason for Visit: Chaplain following up with Pt after initial visit   Description of Visit: Arriving in the room I found Monica Soto in a recliner chair receiving chemo treatment.    In exploration with Monica Soto I worked to reassess her spiritual, emotional, and relational needs.  She appears to have a strong sense of purpose and believes in the power of prayer.  I find no evidence any emotional distress or negative attitudes that will impact her journey.  She has strong relationships of care and good community connections that are encouraging and empowering for her.   We also processed a negative encounter she had at the [facility] cancer center that seems to have impacted her. She was not specific in her description of the encounter except to say it was an issue with the physician. Because of this, Monica Soto seems concerned about her care as Monica Soto.  I will continue to help support and encourage her in this area.   Monica Soto has a number of personal resources that are serving her well in this journey.  She is courageous, and possesses great emotional strength and intelligence.  She has a  strong network of friends and support, and she professes a strong Christian faith.   Plan of Care: I will look to connect with Monica Soto again at her next appointment in 2 weeks.   Monica Soto, Monica Soto   Chaplain, [facility]  [email or web address] [phone]

## 2023-08-13 NOTE — Patient Instructions (Signed)
 [facility] [place] - A DEPT OF [place]  Discharge Instructions: Thank you for choosing [facility] to provide your oncology and hematology care.  If you have a lab appointment with the Cancer Center - please note that after April 8th, 2024, all labs will be drawn in the cancer center.  You do not have to check in or register with the main entrance as you have in the past but will complete your check-in in the cancer center.  Wear comfortable clothing and clothing appropriate for easy access to any Portacath or PICC line.   We strive to give you quality time with your provider. You may need to reschedule your appointment if you arrive late (15 or more minutes).  Arriving late affects you and other patients whose appointments are after yours.  Also, if you miss three or more appointments without notifying the office, you may be dismissed from the clinic at the provider's discretion.      For prescription refill requests, have your pharmacy contact our office and allow 72 hours for refills to be completed.    Today you received the following chemotherapy and/or immunotherapy agents Chaya Cord   To help prevent nausea and vomiting after your treatment, we encourage you to take your nausea medication as directed.  BELOW ARE SYMPTOMS THAT SHOULD BE REPORTED IMMEDIATELY: *FEVER GREATER THAN 100.4 F (38 C) OR HIGHER *CHILLS OR SWEATING *NAUSEA AND VOMITING THAT IS NOT CONTROLLED WITH YOUR NAUSEA MEDICATION *UNUSUAL SHORTNESS OF BREATH *UNUSUAL BRUISING OR BLEEDING *URINARY PROBLEMS (pain or burning when urinating, or frequent urination) *BOWEL PROBLEMS (unusual diarrhea, constipation, pain near the anus) TENDERNESS IN MOUTH AND THROAT WITH OR WITHOUT PRESENCE OF ULCERS (sore throat, sores in mouth, or a toothache) UNUSUAL RASH, SWELLING OR PAIN  UNUSUAL VAGINAL DISCHARGE OR ITCHING   Items with * indicate a potential emergency and should be followed up as  soon as possible or go to the Emergency Department if any problems should occur.  Please show the CHEMOTHERAPY ALERT CARD or IMMUNOTHERAPY ALERT CARD at check-in to the Emergency Department and triage nurse.  Should you have questions after your visit or need to cancel or reschedule your appointment, please contact [facility] [place] - A DEPT OF [facility] [phone]  and follow the prompts.  Office hours are 8:00 a.m. to 4:30 p.m. Monday - Friday. Please note that voicemails left after 4:00 p.m. may not be returned until the following business day.  We are closed weekends and major holidays. You have access to a nurse at all times for urgent questions. Please call the main number to the clinic [phone] and follow the prompts.  For any non-urgent questions, you may also contact your provider using MyChart. We now offer e-Visits for anyone 15 and older to request care online for non-urgent symptoms. For details visit [email or web address].   Also download the MyChart app! Go to the app store, search "MyChart", open the app, select [facility], and log in with your MyChart username and password.

## 2023-08-13 NOTE — Patient Instructions (Signed)

## 2023-08-13 NOTE — Progress Notes (Signed)
 [facility] [address], [place] [address]    Clinic Day:  08/13/2023  Referring physician: [name], MD  Patient Care Team: [name], MD as PCP - General (Family Medicine) Shaaron, Lamar HERO, MD (Gastroenterology) Shannon Agent, MD as Consulting Physician (Radiation Oncology) [name], MD as Consulting Physician (General Surgery) Glean Stephane BROCKS, RN (Inactive) as Oncology Nurse Navigator Tyree Nanetta SAILOR, RN as Oncology Nurse Navigator [name], MD as Medical Oncologist (Medical Oncology) Celestia Joesph SQUIBB, RN as Oncology Nurse Navigator (Medical Oncology)   ASSESSMENT & PLAN:   Assessment: 1.  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  Noticed skin changes in the right breast 3 months ago. - Mammogram (06/19/2022): Large 8-9 cm mass in the right breast with 2 abnormal axillary lymph nodes and skin thickening. 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  1.8 cm ill-defined enhancing mass in the lateral left breast suspicious for additional malignancy.  Right breast mass involves right chest wall including pectoralis and intercostal muscles.  Prominent subcentimeter left axillary lymph nodes. - PET scan (07/12/2022): Intensely hypermetabolic right breast mass with right axillary lymph nodes.  Hypermetabolic metastatic lymph node in the posterior triangle of the right neck.  Hypermetabolic left axillary lymph nodes.  No evidence of metastatic adenopathy in the mediastinum/abdomen/pelvis.  No evidence of visceral metastasis.  Multiple skeletal  metastasis: Left sacral ala 2.5 cm, left iliac bone, L4 and T12 vertebral bodies, multiple rib lesions, left scapular lesion. - She was evaluated by Dr. Loretha and was recommended treatment with ribociclib  and anastrozole . - Germline mutation testing: Negative. - Anastrozole  and ribociclib  started on 07/25/2022, discontinued on 12/13/2022 due to progression. - PET scan (11/15/2022): Mixed response to therapy with overall progression of disease with new bone metastasis and liver metastasis.  New periportal lymph nodes. - Liver biopsy (12/07/2022): Metastatic poorly differentiated adenocarcinoma consistent with breast primary.  ER +2% with moderate staining, PR 0%, HER2 (1+). - Guardant360: APC G1312, T p53, MYC amplification, FGF R2 amplification, EGFR amplification - PD-L1 (22 C3): 0% - Abraxane  day 1, day 8 every 21 days started on 12/21/2022, carboplatin  AUC on day 1 and day 8 added with cycle 2 on 01/10/2023, discontinued on 05/30/2023 due to progression. - NGS (12/07/2022): MS-stable, TMB-low, PTEN pathogenic variant, genomic LOH high (18%), HER2 (1+), PD-L1 (22 C3) CPS-10. - Sacituzumab cycle 1 started on 06/10/2023   2.  Social/family history: - She lives at home with her husband.  Used OCP for approximately 16-18 years.  Underwent hysterectomy after endometrial ablation when she was found to have fibroids causing pain.  No family history of breast or other malignancies.  Smokes 1 pack of cigarettes per day.    Plan: 1.  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  New increased hypermetabolic nodularity along the right breast cutaneous surface. - Cycle 3 of Sacituzumab at 7.5 mg/kg on 07/23/2023. - She had tiredness for 2 days after each treatment and sleeps a lot.  She also had diarrhea after the G-CSF injection lasting about 3 days.  Imodium helps.  Very mild nausea controlled well with Zofran /Compazine  as needed.  No  vomiting reported. - Labs today: Normal LFTs.  Alk phos improved to 188.  CBC with anemia from myelosuppression with hemoglobin 8.5.  Platelet count white count is normal. - CA 15-3 was 426 and CA 27-29 was 548. - Proceed with cycle 4 of Sacituzumab 7.5 mg/kg on day 1 and 8 with G-CSF on day 9.  RTC 3 weeks for follow-up.  Will repeat PET scan for restaging.    2.  Bone metastasis: - She will continue Zometa  every 12 weeks.  Last infusion was 3 weeks ago.  Calcium today is 8.0 with almond 3.1.  3.  Hypomagnesemia: - Continue magnesium  twice daily.  Magnesium  is 1.9.  4.  Leg swelling: - Continue Lasix  20 mg in the mornings daily as needed.  She has not required in the last couple of days.     Orders Placed This Encounter  Procedures   NM PET Image Restag (PS) Skull Base To Thigh    Standing Status:   Future    Expected Date:   08/27/2023    Expiration Date:   08/12/2024    If indicated for the ordered procedure, I authorize the administration of a radiopharmaceutical per Radiology protocol:   Yes    Is the patient pregnant?:   No    Preferred imaging location?:   [facility]   Cancer antigen 15-3   Cancer antigen 27.29      I,Helena R Teague,acting as a scribe for Alean Stands, MD.,have documented all relevant documentation on the behalf of Monica Haacke, MD,as directed by  Alean Stands, MD while in the presence of Alean Stands, MD.  I, Alean Stands MD, have reviewed the above documentation for accuracy and completeness, and I agree with the above.      Alean Stands, MD   7/8/20259:58 AM  CHIEF COMPLAINT:   Diagnosis: metastatic inflammatory right breast cancer    Cancer Staging  Malignant neoplasm of upper-outer quadrant of right breast in female, estrogen receptor positive (HCC) Staging form: Breast, AJCC 8th Edition - Clinical: Stage IV (cT4, cN2, cM1, G2, ER+, PR+, HER2-) - Signed by Iruku, Praveena, MD on 07/17/2022     Prior Therapy: Carboplatin , Abraxane  and Keytruda   Current Therapy: Sacituzumab govitecan    HISTORY OF PRESENT ILLNESS:   Oncology History  Malignant neoplasm of upper-outer quadrant of right breast in female, estrogen receptor positive (HCC)  06/19/2022 Mammogram   Mammogram showed large mass in the right breast which appears to measure 8 to 9 cm on ultrasound, 2 abnormal right axillary lymph node and a third borderline lymph node.  Skin thickening noted along the medial aspect of left breast which may be an extension of the marked skin thickening diffusely on the right.  Otherwise no left breast abnormalities are identified.   06/22/2022 Pathology Results   Needle core biopsy from the right breast upper outer quadrant showed grade 2 IDC, both lymph nodes with metastatic adenocarcinoma.  Prognostic showed ER 90% positive strong staining PR 70% positive strong staining Ki-67 of 20% HER2 negative by IHC 1+   07/03/2022 Initial Diagnosis   Malignant neoplasm of upper-outer quadrant of right breast in female, estrogen receptor positive (HCC)   07/04/2022 Cancer Staging   Staging form: Breast, AJCC 8th Edition - Clinical: Stage IV (cT4, cN2, cM1, G2, ER+, PR+, HER2-) - Signed by Loretha Ash, MD on 07/17/2022 Stage prefix: Initial diagnosis Histologic grading system: 3 grade system  07/16/2022 - 07/16/2022 Chemotherapy   Patient is on Treatment Plan : BREAST ADJUVANT DOSE DENSE AC q14d / PACLitaxel  q7d      Genetic Testing   [facility] Custom Panel+RNA was Negative. Report date is 07/10/2022.  The Custom Hereditary Cancers Panel offered by [facility] includes sequencing and/or deletion duplication testing of the following 43 genes: APC, ATM, AXIN2, BAP1, BARD1, BMPR1A, BRCA1, BRCA2, BRIP1, CDH1, CDK4, CDKN2A (p14ARF and p16INK4a only), CHEK2, CTNNA1, EPCAM (Deletion/duplication testing only), FH, GREM1 (promoter region duplication testing only), HOXB13, KIT, MBD4, MEN1, MLH1, MSH2, MSH3, MSH6,  MUTYH, NF1, NHTL1, PALB2, PDGFRA, PMS2, POLD1, POLE, PTEN, RAD51C, RAD51D, SMAD4, SMARCA4. STK11, TP53, TSC1, TSC2, and VHL.   12/13/2022 - 12/13/2022 Chemotherapy   Patient is on Treatment Plan : BREAST METASTATIC Fam-Trastuzumab Deruxtecan-nxki (Enhertu) (5.4) q21d     12/21/2022 - 05/23/2023 Chemotherapy   Patient is on Treatment Plan : BREAST Paclitaxel -Albumin (Abraxane ) (100) D1,8 + Carboplatin  AUC2 D1,8 every 3 weeks, q21d     06/10/2023 -  Chemotherapy   Patient is on Treatment Plan : BREAST Sacituzumab govitecan -hziy (Trodelvy ) D1,8 q21d        INTERVAL HISTORY:   Monica Soto is a 58 y.o. female presenting to clinic today for follow up of metastatic inflammatory right breast cancer. She was last seen by me on 07/23/23.  Today, she states that she is doing well overall. Her appetite level is at 75%. Her energy level is at 25%. Rogue reports tiredness, worsened 3-4 days after treatment. She notes diarrhea lasting 3 days after G-CSF injection. She has mild nausea and denies any vomiting. Nausea is well controlled with 1 pill of Zofran . Haya notes drowsiness after taking Zofran .   She is tolerating the reduced dosage of Sacituzumab well compared to previous chemotherapy and immunotherapy. Mija denies any new onset pains and dental issues. She notes minimal discharge from breast wound and denies any malodorous smell.  Shyloh is taking Lasix  in the mornings and magnesium  as prescribed.  PAST MEDICAL HISTORY:   Past Medical History: Past Medical History:  Diagnosis Date   Depression    Diabetes mellitus without complication (HCC)    Erosive esophagitis 04/26/2010   EGD by Dr. Shaaron, small hiatal hernia   GERD (gastroesophageal reflux disease)    History of stomach ulcers    IBS (irritable bowel syndrome)    Diarrhea predominant   Microscopic colitis 04/26/2010   Colonoscopy by Dr. Shaaron, good response with Entocort   Tubular adenoma of colon 04/26/2010   Junction of descending and sigmoid 40  CM from anus    Surgical History: Past Surgical History:  Procedure Laterality Date   ABDOMINAL HYSTERECTOMY     APPENDECTOMY  2/11   Dr. Floria with a delayed closure   BREAST BIOPSY Right 06/22/2022   US  RT BREAST BX W LOC DEV 1ST LESION IMG BX SPEC US  GUIDE 06/22/2022 [facility] MAMMOGRAPHY   PORTACATH PLACEMENT N/A 07/16/2022   Procedure: INSERTION PORT-A-CATH;  Surgeon: [name], MD;  Location:  SURGERY CENTER;  Service: General;  Laterality: N/A;   TONSILLECTOMY     TUBAL LIGATION      Social History: Social History   Socioeconomic History   Marital status: Married    Spouse name: Not on file   Number of children: Not on file   Years of education: Not on file   Highest education level: Not on file  Occupational History    Employer: [employer]    Comment: Third shift  Tobacco Use  Smoking status: Every Day    Current packs/day: 0.50    Types: Cigarettes   Smokeless tobacco: Never  Vaping Use   Vaping status: Never Used  Substance and Sexual Activity   Alcohol use: Yes    Alcohol/week: 1.0 standard drink of alcohol    Types: 1 Glasses of wine per week    Comment: seldom   Drug use: No   Sexual activity: Yes    Birth control/protection: Surgical  Other Topics Concern   Not on file  Social History Narrative   Not on file   Social Drivers of Health   Financial Resource Strain: Not on file  Food Insecurity: No Food Insecurity (07/04/2022)   Hunger Vital Sign    Worried About Running Out of Food in the Last Year: Never true    Ran Out of Food in the Last Year: Never true  Transportation Needs: No Transportation Needs (07/04/2022)   PRAPARE - Administrator, Civil Service (Medical): No    Lack of Transportation (Non-Medical): No  Physical Activity: Not on file  Stress: Not on file  Social Connections: Not on file  Intimate Partner Violence: Not on file    Family History: Family History  Problem Relation Age of Onset    Diabetes Mother    Coronary artery disease Mother    Healthy Father     Current Medications:  Current Outpatient Medications:    Aspirin-Acetaminophen -Caffeine (GOODY HEADACHE PO), Take 1 packet by mouth 2 (two) times daily as needed (for pain)., Disp: , Rfl:    Cholecalciferol 1.25 MG (50000 UT) capsule, Take 1 capsule by mouth once a week., Disp: , Rfl:    diphenoxylate -atropine  (LOMOTIL ) 2.5-0.025 MG tablet, Take 2 tablets after first loose stool and 1 after each loose stool thereafter.  Do not exceed 8 tablets in 1 day., Disp: 30 tablet, Rfl: 3   fluticasone  (FLONASE ) 50 MCG/ACT nasal spray, Place 1 spray into both nostrils daily., Disp: 16 g, Rfl: 0   furosemide  (LASIX ) 20 MG tablet, TAKE 1 TABLET(20 MG) BY MOUTH DAILY AS NEEDED FOR SWELLING, Disp: 30 tablet, Rfl: 2   HYDROcodone -acetaminophen  (NORCO/VICODIN) 5-325 MG tablet, Take 1 tablet by mouth every 6 (six) hours as needed for moderate pain., Disp: 15 tablet, Rfl: 0   lidocaine -prilocaine  (EMLA ) cream, APPLY TOPICALLY TO THE AFFECTED AREA 1 TIME, Disp: 30 g, Rfl: 3   magnesium  oxide (MAG-OX) 400 (240 Mg) MG tablet, Take 1 tablet (400 mg total) by mouth 2 (two) times daily., Disp: 60 tablet, Rfl: 3   prochlorperazine  (COMPAZINE ) 10 MG tablet, Take 1 tablet (10 mg total) by mouth every 6 (six) hours as needed for nausea or vomiting., Disp: 30 tablet, Rfl: 2   ondansetron  (ZOFRAN ) 8 MG tablet, TAKE 1 TABLET(8 MG) BY MOUTH EVERY 8 HOURS AS NEEDED FOR NAUSEA OR VOMITING. START THIRD DAY AFTER DOXORUBICIN/ CYCLOPHOSPHAMIDE CHEMOTHERAPY, Disp: 30 tablet, Rfl: 2 No current facility-administered medications for this visit.  Facility-Administered Medications Ordered in Other Visits:    0.9 %  sodium chloride  infusion, , Intravenous, Continuous, Tuyen Uncapher, MD   acetaminophen  (TYLENOL ) tablet 650 mg, 650 mg, Oral, Once, [name], MD   atropine  injection 0.5 mg, 0.5 mg, Intravenous, Once PRN, Tesia Lybrand, MD    cetirizine  (QUZYTTIR ) injection 10 mg, 10 mg, Intravenous, Once, [name], MD   dexamethasone  (DECADRON ) injection 10 mg, 10 mg, Intravenous, Once, [name], MD   famotidine  (PEPCID ) IVPB 20 mg premix, 20 mg, Intravenous, Once, [name],  MD   fosaprepitant  (EMEND) 150 mg in sodium chloride  0.9 % 145 mL IVPB, 150 mg, Intravenous, Once, [name], MD   heparin  lock flush 100 unit/mL, 500 Units, Intracatheter, Once PRN, Daquana Paddock, MD   palonosetron  (ALOXI ) injection 0.25 mg, 0.25 mg, Intravenous, Once, [name], MD   sacituzumab govitecan -hziy (TRODELVY ) 540 mg in sodium chloride  0.9 % 250 mL (1.7763 mg/mL) chemo infusion, 7.5 mg/kg (Treatment Plan Recorded), Intravenous, Once, [name], MD   Allergies: No Known Allergies  REVIEW OF SYSTEMS:   Review of Systems  Constitutional:  Negative for chills, fatigue and fever.  HENT:   Negative for lump/mass, mouth sores, nosebleeds, sore throat and trouble swallowing.   Eyes:  Negative for eye problems.  Respiratory:  Positive for cough. Negative for shortness of breath.   Cardiovascular:  Negative for chest pain, leg swelling and palpitations.  Gastrointestinal:  Positive for diarrhea and nausea. Negative for abdominal pain, constipation and vomiting.  Genitourinary:  Negative for bladder incontinence, difficulty urinating, dysuria, frequency, hematuria and nocturia.   Musculoskeletal:  Negative for arthralgias, back pain, flank pain, myalgias and neck pain.  Skin:  Negative for itching and rash.  Neurological:  Positive for numbness (in toes). Negative for dizziness and headaches.  Hematological:  Does not bruise/bleed easily.  Psychiatric/Behavioral:  Negative for depression, sleep disturbance and suicidal ideas. The patient is not nervous/anxious.   All other systems reviewed and are negative.    VITALS:   There were no vitals taken for this visit.  Wt Readings  from Last 3 Encounters:  08/13/23 154 lb 12.2 oz (70.2 kg)  07/23/23 155 lb 14.4 oz (70.7 kg)  07/09/23 157 lb 6.4 oz (71.4 kg)    There is no height or weight on file to calculate BMI.  Performance status (ECOG): 1 - Symptomatic but completely ambulatory   PHYSICAL EXAM:   Physical Exam Vitals and nursing note reviewed. Exam conducted with a chaperone present.  Constitutional:      Appearance: Normal appearance.  Cardiovascular:     Rate and Rhythm: Normal rate and regular rhythm.     Pulses: Normal pulses.     Heart sounds: Normal heart sounds.  Pulmonary:     Effort: Pulmonary effort is normal.     Breath sounds: Normal breath sounds.  Abdominal:     Palpations: Abdomen is soft. There is no hepatomegaly, splenomegaly or mass.     Tenderness: There is no abdominal tenderness.  Musculoskeletal:     Right lower leg: No edema.     Left lower leg: No edema.  Lymphadenopathy:     Cervical: No cervical adenopathy.     Right cervical: No superficial, deep or posterior cervical adenopathy.    Left cervical: No superficial, deep or posterior cervical adenopathy.     Upper Body:     Right upper body: No supraclavicular or axillary adenopathy.     Left upper body: No supraclavicular or axillary adenopathy.  Neurological:     General: No focal deficit present.     Mental Status: She is alert and oriented to person, place, and time.  Psychiatric:        Mood and Affect: Mood normal.        Behavior: Behavior normal.     LABS:      Latest Ref Rng & Units 08/13/2023    8:29 AM 07/30/2023    9:24 AM 07/23/2023    8:56 AM  CBC  WBC 4.0 - 10.5 K/uL 7.9  4.2  8.5   Hemoglobin 12.0 - 15.0 g/dL 8.5  8.8  8.5   Hematocrit 36.0 - 46.0 % 26.6  27.3  27.5   Platelets 150 - 400 K/uL 166  186  180       Latest Ref Rng & Units 08/13/2023    8:29 AM 07/30/2023    9:24 AM 07/23/2023    8:56 AM  CMP  Glucose 70 - 99 mg/dL 862  886  892   BUN 6 - 20 mg/dL 7  8  7    Creatinine 0.44 - 1.00  mg/dL 9.43  9.49  9.45   Sodium 135 - 145 mmol/L 138  137  139   Potassium 3.5 - 5.1 mmol/L 3.6  3.7  3.6   Chloride 98 - 111 mmol/L 109  105  107   CO2 22 - 32 mmol/L 21  21  23    Calcium 8.9 - 10.3 mg/dL 8.0  8.3  8.2   Total Protein 6.5 - 8.1 g/dL 6.6  6.8  6.6   Total Bilirubin 0.0 - 1.2 mg/dL 0.5  0.3  0.4   Alkaline Phos 38 - 126 U/L 188  206  249   AST 15 - 41 U/L 16  17  15    ALT 0 - 44 U/L 10  12  11       No results found for: CEA1, CEA / No results found for: CEA1, CEA No results found for: PSA1 No results found for: CAN199 No results found for: CAN125  No results found for: TOTALPROTELP, ALBUMINELP, A1GS, A2GS, BETS, BETA2SER, GAMS, MSPIKE, SPEI No results found for: TIBC, FERRITIN, IRONPCTSAT No results found for: LDH   STUDIES:   No results found.

## 2023-08-13 NOTE — Progress Notes (Signed)
 Patient has been examined by Dr. Ellin Saba. Vital signs and labs have been reviewed by MD - ANC, Creatinine, LFTs, hemoglobin, and platelets are within treatment parameters per M.D. - pt may proceed with treatment.  Primary RN and pharmacy notified.

## 2023-08-13 NOTE — Progress Notes (Signed)
 Patient tolerated chemotherapy with no complaints voiced.  Side effects with management reviewed with understanding verbalized.  Port site clean and dry with no bruising or swelling noted at site.  Good blood return noted before and after administration of chemotherapy.  Band aid applied.  Patient left in satisfactory condition with VSS and no s/s of distress noted. All follow ups as scheduled.   Monica Soto [employer]

## 2023-08-14 ENCOUNTER — Other Ambulatory Visit: Payer: Self-pay

## 2023-08-14 LAB — CANCER ANTIGEN 27.29: CA 27.29: 252.3 U/mL — ABNORMAL HIGH (ref 0.0–38.6)

## 2023-08-14 LAB — CANCER ANTIGEN 15-3: CA 15-3: 178 U/mL — ABNORMAL HIGH (ref 0.0–25.0)

## 2023-08-20 ENCOUNTER — Inpatient Hospital Stay

## 2023-08-20 VITALS — BP 125/71 | HR 81 | Temp 97.9°F | Resp 16

## 2023-08-20 VITALS — BP 132/76 | HR 80 | Temp 98.0°F | Resp 18 | Wt 155.9 lb

## 2023-08-20 DIAGNOSIS — Z95828 Presence of other vascular implants and grafts: Secondary | ICD-10-CM

## 2023-08-20 DIAGNOSIS — C50411 Malignant neoplasm of upper-outer quadrant of right female breast: Secondary | ICD-10-CM

## 2023-08-20 DIAGNOSIS — Z5112 Encounter for antineoplastic immunotherapy: Secondary | ICD-10-CM | POA: Diagnosis not present

## 2023-08-20 LAB — CBC WITH DIFFERENTIAL/PLATELET
Abs Immature Granulocytes: 0.07 K/uL (ref 0.00–0.07)
Basophils Absolute: 0 K/uL (ref 0.0–0.1)
Basophils Relative: 1 %
Eosinophils Absolute: 0.1 K/uL (ref 0.0–0.5)
Eosinophils Relative: 2 %
HCT: 26.9 % — ABNORMAL LOW (ref 36.0–46.0)
Hemoglobin: 8.5 g/dL — ABNORMAL LOW (ref 12.0–15.0)
Immature Granulocytes: 2 %
Lymphocytes Relative: 26 %
Lymphs Abs: 1.1 K/uL (ref 0.7–4.0)
MCH: 32.7 pg (ref 26.0–34.0)
MCHC: 31.6 g/dL (ref 30.0–36.0)
MCV: 103.5 fL — ABNORMAL HIGH (ref 80.0–100.0)
Monocytes Absolute: 0.4 K/uL (ref 0.1–1.0)
Monocytes Relative: 10 %
Neutro Abs: 2.5 K/uL (ref 1.7–7.7)
Neutrophils Relative %: 59 %
Platelets: 165 K/uL (ref 150–400)
RBC: 2.6 MIL/uL — ABNORMAL LOW (ref 3.87–5.11)
RDW: 20.2 % — ABNORMAL HIGH (ref 11.5–15.5)
WBC: 4.1 K/uL (ref 4.0–10.5)
nRBC: 1.2 % — ABNORMAL HIGH (ref 0.0–0.2)

## 2023-08-20 LAB — COMPREHENSIVE METABOLIC PANEL WITH GFR
ALT: 10 U/L (ref 0–44)
AST: 19 U/L (ref 15–41)
Albumin: 3.1 g/dL — ABNORMAL LOW (ref 3.5–5.0)
Alkaline Phosphatase: 153 U/L — ABNORMAL HIGH (ref 38–126)
Anion gap: 11 (ref 5–15)
BUN: 7 mg/dL (ref 6–20)
CO2: 20 mmol/L — ABNORMAL LOW (ref 22–32)
Calcium: 8.1 mg/dL — ABNORMAL LOW (ref 8.9–10.3)
Chloride: 107 mmol/L (ref 98–111)
Creatinine, Ser: 0.54 mg/dL (ref 0.44–1.00)
GFR, Estimated: >60 mL/min (ref >60)
Glucose, Bld: 135 mg/dL — ABNORMAL HIGH (ref 70–99)
Potassium: 3.3 mmol/L — ABNORMAL LOW (ref 3.5–5.1)
Sodium: 138 mmol/L (ref 135–145)
Total Bilirubin: 0.3 mg/dL (ref 0.0–1.2)
Total Protein: 6.2 g/dL — ABNORMAL LOW (ref 6.5–8.1)

## 2023-08-20 LAB — MAGNESIUM: Magnesium: 1.7 mg/dL (ref 1.7–2.4)

## 2023-08-20 LAB — PHOSPHORUS: Phosphorus: 2.7 mg/dL (ref 2.5–4.6)

## 2023-08-20 MED ORDER — ACETAMINOPHEN 325 MG PO TABS
650.0000 mg | ORAL_TABLET | Freq: Once | ORAL | Status: AC
Start: 2023-08-20 — End: 2023-08-20
  Administered 2023-08-20: 650 mg via ORAL
  Filled 2023-08-20: qty 2

## 2023-08-20 MED ORDER — ATROPINE SULFATE 1 MG/ML IV SOLN
0.5000 mg | Freq: Once | INTRAVENOUS | Status: AC
  Administered 2023-08-20: 0.5 mg via INTRAVENOUS
  Filled 2023-08-20: qty 1

## 2023-08-20 MED ORDER — FAMOTIDINE IN NACL 20-0.9 MG/50ML-% IV SOLN
20.0000 mg | Freq: Once | INTRAVENOUS | Status: AC
  Administered 2023-08-20: 20 mg via INTRAVENOUS
  Filled 2023-08-20: qty 50

## 2023-08-20 MED ORDER — SODIUM CHLORIDE 0.9% FLUSH
10.0000 mL | INTRAVENOUS | Status: DC | PRN
  Administered 2023-08-20: 10 mL

## 2023-08-20 MED ORDER — CETIRIZINE HCL 10 MG/ML IV SOLN
10.0000 mg | Freq: Once | INTRAVENOUS | Status: AC
  Administered 2023-08-20: 10 mg via INTRAVENOUS
  Filled 2023-08-20: qty 1

## 2023-08-20 MED ORDER — SODIUM CHLORIDE FLUSH 0.9 % IV SOLN
10.0000 mL | Freq: Once | INTRAVENOUS | Status: AC
  Administered 2023-08-20: 10 mL via INTRAVENOUS
  Filled 2023-08-20: qty 10

## 2023-08-20 MED ORDER — SODIUM CHLORIDE 0.9 % IV SOLN
150.0000 mg | Freq: Once | INTRAVENOUS | Status: AC
  Administered 2023-08-20: 150 mg via INTRAVENOUS
  Filled 2023-08-20: qty 150

## 2023-08-20 MED ORDER — DEXAMETHASONE SODIUM PHOSPHATE 10 MG/ML IJ SOLN
10.0000 mg | Freq: Once | INTRAMUSCULAR | Status: AC
  Administered 2023-08-20: 10 mg via INTRAVENOUS
  Filled 2023-08-20: qty 1

## 2023-08-20 MED ORDER — POTASSIUM CHLORIDE CRYS ER 20 MEQ PO TBCR
40.0000 meq | EXTENDED_RELEASE_TABLET | Freq: Once | ORAL | Status: AC
  Administered 2023-08-20: 40 meq via ORAL
  Filled 2023-08-20: qty 2

## 2023-08-20 MED ORDER — SODIUM CHLORIDE 0.9 % IV SOLN: INTRAVENOUS | Status: DC

## 2023-08-20 MED ORDER — HEPARIN SOD (PORK) LOCK FLUSH 100 UNIT/ML IV SOLN
500.0000 [IU] | Freq: Once | INTRAVENOUS | Status: AC | PRN
  Administered 2023-08-20: 500 [IU]

## 2023-08-20 MED ORDER — PALONOSETRON HCL INJECTION 0.25 MG/5ML
0.2500 mg | Freq: Once | INTRAVENOUS | Status: AC
  Administered 2023-08-20: 0.25 mg via INTRAVENOUS
  Filled 2023-08-20: qty 5

## 2023-08-20 MED ORDER — SODIUM CHLORIDE 0.9 % IV SOLN
7.5000 mg/kg | Freq: Once | INTRAVENOUS | Status: AC
  Administered 2023-08-20: 540 mg via INTRAVENOUS
  Filled 2023-08-20: qty 54

## 2023-08-20 NOTE — Progress Notes (Signed)
 Labs reviewed today, no new issues with pt today, proceed as planned.  Treatment given per orders. Patient tolerated it well without problems. Vitals stable and discharged home from clinic ambulatory. Follow up as scheduled.

## 2023-08-20 NOTE — Patient Instructions (Signed)
 [facility] [place] - A DEPT OF [place]  Discharge Instructions: Thank you for choosing [facility] to provide your oncology and hematology care.  If you have a lab appointment with the Cancer Center - please note that after April 8th, 2024, all labs will be drawn in the cancer center.  You do not have to check in or register with the main entrance as you have in the past but will complete your check-in in the cancer center.  Wear comfortable clothing and clothing appropriate for easy access to any Portacath or PICC line.   We strive to give you quality time with your provider. You may need to reschedule your appointment if you arrive late (15 or more minutes).  Arriving late affects you and other patients whose appointments are after yours.  Also, if you miss three or more appointments without notifying the office, you may be dismissed from the clinic at the provider's discretion.      For prescription refill requests, have your pharmacy contact our office and allow 72 hours for refills to be completed.    Today you received the following chemotherapy and/or immunotherapy agents Chaya Cord   To help prevent nausea and vomiting after your treatment, we encourage you to take your nausea medication as directed.  BELOW ARE SYMPTOMS THAT SHOULD BE REPORTED IMMEDIATELY: *FEVER GREATER THAN 100.4 F (38 C) OR HIGHER *CHILLS OR SWEATING *NAUSEA AND VOMITING THAT IS NOT CONTROLLED WITH YOUR NAUSEA MEDICATION *UNUSUAL SHORTNESS OF BREATH *UNUSUAL BRUISING OR BLEEDING *URINARY PROBLEMS (pain or burning when urinating, or frequent urination) *BOWEL PROBLEMS (unusual diarrhea, constipation, pain near the anus) TENDERNESS IN MOUTH AND THROAT WITH OR WITHOUT PRESENCE OF ULCERS (sore throat, sores in mouth, or a toothache) UNUSUAL RASH, SWELLING OR PAIN  UNUSUAL VAGINAL DISCHARGE OR ITCHING   Items with * indicate a potential emergency and should be followed up as  soon as possible or go to the Emergency Department if any problems should occur.  Please show the CHEMOTHERAPY ALERT CARD or IMMUNOTHERAPY ALERT CARD at check-in to the Emergency Department and triage nurse.  Should you have questions after your visit or need to cancel or reschedule your appointment, please contact [facility] [place] - A DEPT OF [facility] [phone]  and follow the prompts.  Office hours are 8:00 a.m. to 4:30 p.m. Monday - Friday. Please note that voicemails left after 4:00 p.m. may not be returned until the following business day.  We are closed weekends and major holidays. You have access to a nurse at all times for urgent questions. Please call the main number to the clinic [phone] and follow the prompts.  For any non-urgent questions, you may also contact your provider using MyChart. We now offer e-Visits for anyone 15 and older to request care online for non-urgent symptoms. For details visit [email or web address].   Also download the MyChart app! Go to the app store, search "MyChart", open the app, select [facility], and log in with your MyChart username and password.

## 2023-08-21 ENCOUNTER — Inpatient Hospital Stay

## 2023-08-21 VITALS — BP 127/87 | HR 80 | Temp 98.3°F | Resp 18

## 2023-08-21 DIAGNOSIS — C50411 Malignant neoplasm of upper-outer quadrant of right female breast: Secondary | ICD-10-CM

## 2023-08-21 DIAGNOSIS — Z5112 Encounter for antineoplastic immunotherapy: Secondary | ICD-10-CM | POA: Diagnosis not present

## 2023-08-21 LAB — CANCER ANTIGEN 15-3: CA 15-3: 193 U/mL — ABNORMAL HIGH (ref 0.0–25.0)

## 2023-08-21 LAB — CANCER ANTIGEN 27.29: CA 27.29: 258.1 U/mL — ABNORMAL HIGH (ref 0.0–38.6)

## 2023-08-21 MED ORDER — PEGFILGRASTIM-CBQV 6 MG/0.6ML ~~LOC~~ SOSY
6.0000 mg | PREFILLED_SYRINGE | Freq: Once | SUBCUTANEOUS | Status: AC
  Administered 2023-08-21: 6 mg via SUBCUTANEOUS
  Filled 2023-08-21: qty 0.6

## 2023-08-21 NOTE — Progress Notes (Signed)
 Monica Soto presents today for Udenyca  injection per the provider's orders.  Stable during administration without incident; injection site WNL; see MAR for injection details.  Patient tolerated procedure well and without incident.  No questions or complaints noted at this time.

## 2023-08-21 NOTE — Patient Instructions (Signed)
 [facility] [place] - A DEPT OF [facility]  Discharge Instructions: Thank you for choosing [facility] to provide your oncology and hematology care.  If you have a lab appointment with the Cancer Center - please note that after April 8th, 2024, all labs will be drawn in the cancer center.  You do not have to check in or register with the main entrance as you have in the past but will complete your check-in in the cancer center.  Wear comfortable clothing and clothing appropriate for easy access to any Portacath or PICC line.   We strive to give you quality time with your provider. You may need to reschedule your appointment if you arrive late (15 or more minutes).  Arriving late affects you and other patients whose appointments are after yours.  Also, if you miss three or more appointments without notifying the office, you may be dismissed from the clinic at the provider's discretion.      For prescription refill requests, have your pharmacy contact our office and allow 72 hours for refills to be completed.    Today you received the following chemotherapy and/or immunotherapy agents Udenyca      To help prevent nausea and vomiting after your treatment, we encourage you to take your nausea medication as directed.  BELOW ARE SYMPTOMS THAT SHOULD BE REPORTED IMMEDIATELY: *FEVER GREATER THAN 100.4 F (38 C) OR HIGHER *CHILLS OR SWEATING *NAUSEA AND VOMITING THAT IS NOT CONTROLLED WITH YOUR NAUSEA MEDICATION *UNUSUAL SHORTNESS OF BREATH *UNUSUAL BRUISING OR BLEEDING *URINARY PROBLEMS (pain or burning when urinating, or frequent urination) *BOWEL PROBLEMS (unusual diarrhea, constipation, pain near the anus) TENDERNESS IN MOUTH AND THROAT WITH OR WITHOUT PRESENCE OF ULCERS (sore throat, sores in mouth, or a toothache) UNUSUAL RASH, SWELLING OR PAIN  UNUSUAL VAGINAL DISCHARGE OR ITCHING   Items with * indicate a potential emergency and should be followed up  as soon as possible or go to the Emergency Department if any problems should occur.  Please show the CHEMOTHERAPY ALERT CARD or IMMUNOTHERAPY ALERT CARD at check-in to the Emergency Department and triage nurse.  Should you have questions after your visit or need to cancel or reschedule your appointment, please contact [facility] [place] - A DEPT OF [facility] [phone]  and follow the prompts.  Office hours are 8:00 a.m. to 4:30 p.m. Monday - Friday. Please note that voicemails left after 4:00 p.m. may not be returned until the following business day.  We are closed weekends and major holidays. You have access to a nurse at all times for urgent questions. Please call the main number to the clinic [phone] and follow the prompts.  For any non-urgent questions, you may also contact your provider using MyChart. We now offer e-Visits for anyone 48 and older to request care online for non-urgent symptoms. For details visit [email or web address].   Also download the MyChart app! Go to the app store, search "MyChart", open the app, select , and log in with your MyChart username and password.

## 2023-08-29 ENCOUNTER — Ambulatory Visit (HOSPITAL_COMMUNITY)
Admission: RE | Admit: 2023-08-29 | Discharge: 2023-08-29 | Disposition: A | Discharge status: Home / Self Care | Source: Ambulatory Visit | Attending: Hematology | Admitting: Hematology

## 2023-08-29 DIAGNOSIS — Z170 Estrogen receptor positive status [ER+]: Secondary | ICD-10-CM | POA: Insufficient documentation

## 2023-08-29 DIAGNOSIS — C50411 Malignant neoplasm of upper-outer quadrant of right female breast: Secondary | ICD-10-CM | POA: Diagnosis present

## 2023-08-29 MED ORDER — HEPARIN SOD (PORK) LOCK FLUSH 100 UNIT/ML IV SOLN
500.0000 [IU] | Freq: Once | INTRAVENOUS | Status: AC
  Administered 2023-08-29: 500 [IU] via INTRAVENOUS

## 2023-08-29 MED ORDER — FLUDEOXYGLUCOSE F - 18 (FDG) INJECTION
8.4500 | Freq: Once | INTRAVENOUS | Status: AC | PRN
  Administered 2023-08-29: 8.45 via INTRAVENOUS

## 2023-09-03 ENCOUNTER — Inpatient Hospital Stay

## 2023-09-03 ENCOUNTER — Inpatient Hospital Stay (HOSPITAL_BASED_OUTPATIENT_CLINIC_OR_DEPARTMENT_OTHER): Admitting: Hematology

## 2023-09-03 ENCOUNTER — Encounter: Payer: Self-pay | Admitting: Hematology

## 2023-09-03 VITALS — BP 116/68 | HR 105 | Temp 98.1°F | Resp 16 | Wt 153.7 lb

## 2023-09-03 VITALS — BP 104/59 | Temp 98.1°F | Resp 16

## 2023-09-03 DIAGNOSIS — Z170 Estrogen receptor positive status [ER+]: Secondary | ICD-10-CM

## 2023-09-03 DIAGNOSIS — C50411 Malignant neoplasm of upper-outer quadrant of right female breast: Secondary | ICD-10-CM

## 2023-09-03 DIAGNOSIS — Z5112 Encounter for antineoplastic immunotherapy: Secondary | ICD-10-CM | POA: Diagnosis not present

## 2023-09-03 LAB — CBC WITH DIFFERENTIAL/PLATELET
Abs Immature Granulocytes: 0.13 K/uL — ABNORMAL HIGH (ref 0.00–0.07)
Basophils Absolute: 0 K/uL (ref 0.0–0.1)
Basophils Relative: 0 %
Eosinophils Absolute: 0 K/uL (ref 0.0–0.5)
Eosinophils Relative: 0 %
HCT: 29 % — ABNORMAL LOW (ref 36.0–46.0)
Hemoglobin: 9.1 g/dL — ABNORMAL LOW (ref 12.0–15.0)
Immature Granulocytes: 1 %
Lymphocytes Relative: 9 %
Lymphs Abs: 0.9 K/uL (ref 0.7–4.0)
MCH: 33 pg (ref 26.0–34.0)
MCHC: 31.4 g/dL (ref 30.0–36.0)
MCV: 105.1 fL — ABNORMAL HIGH (ref 80.0–100.0)
Monocytes Absolute: 0.6 K/uL (ref 0.1–1.0)
Monocytes Relative: 6 %
Neutro Abs: 8.5 K/uL — ABNORMAL HIGH (ref 1.7–7.7)
Neutrophils Relative %: 84 %
Platelets: 172 K/uL (ref 150–400)
RBC: 2.76 MIL/uL — ABNORMAL LOW (ref 3.87–5.11)
RDW: 19.5 % — ABNORMAL HIGH (ref 11.5–15.5)
WBC: 10.2 K/uL (ref 4.0–10.5)
nRBC: 0 % (ref 0.0–0.2)

## 2023-09-03 LAB — COMPREHENSIVE METABOLIC PANEL WITH GFR
ALT: 12 U/L (ref 0–44)
AST: 18 U/L (ref 15–41)
Albumin: 3.3 g/dL — ABNORMAL LOW (ref 3.5–5.0)
Alkaline Phosphatase: 178 U/L — ABNORMAL HIGH (ref 38–126)
Anion gap: 11 (ref 5–15)
BUN: 9 mg/dL (ref 6–20)
CO2: 21 mmol/L — ABNORMAL LOW (ref 22–32)
Calcium: 8.3 mg/dL — ABNORMAL LOW (ref 8.9–10.3)
Chloride: 103 mmol/L (ref 98–111)
Creatinine, Ser: 0.66 mg/dL (ref 0.44–1.00)
GFR, Estimated: >60 mL/min (ref >60)
Glucose, Bld: 169 mg/dL — ABNORMAL HIGH (ref 70–99)
Potassium: 3.2 mmol/L — ABNORMAL LOW (ref 3.5–5.1)
Sodium: 135 mmol/L (ref 135–145)
Total Bilirubin: 0.7 mg/dL (ref 0.0–1.2)
Total Protein: 6.9 g/dL (ref 6.5–8.1)

## 2023-09-03 LAB — MAGNESIUM: Magnesium: 1.7 mg/dL (ref 1.7–2.4)

## 2023-09-03 MED ORDER — ACETAMINOPHEN 325 MG PO TABS
650.0000 mg | ORAL_TABLET | Freq: Once | ORAL | Status: AC
  Administered 2023-09-03: 650 mg via ORAL
  Filled 2023-09-03: qty 2

## 2023-09-03 MED ORDER — SODIUM CHLORIDE 0.9 % IV SOLN
150.0000 mg | Freq: Once | INTRAVENOUS | Status: AC
  Administered 2023-09-03: 150 mg via INTRAVENOUS
  Filled 2023-09-03: qty 150

## 2023-09-03 MED ORDER — SODIUM CHLORIDE 0.9% FLUSH
10.0000 mL | INTRAVENOUS | Status: DC | PRN
  Administered 2023-09-03: 10 mL

## 2023-09-03 MED ORDER — SODIUM CHLORIDE 0.9 % IV SOLN
7.5000 mg/kg | Freq: Once | INTRAVENOUS | Status: AC
  Administered 2023-09-03: 540 mg via INTRAVENOUS
  Filled 2023-09-03: qty 54

## 2023-09-03 MED ORDER — FAMOTIDINE IN NACL 20-0.9 MG/50ML-% IV SOLN
20.0000 mg | Freq: Once | INTRAVENOUS | Status: AC
  Administered 2023-09-03: 20 mg via INTRAVENOUS
  Filled 2023-09-03: qty 50

## 2023-09-03 MED ORDER — ATROPINE SULFATE 1 MG/ML IV SOLN
0.5000 mg | Freq: Once | INTRAVENOUS | Status: AC
  Administered 2023-09-03: 0.5 mg via INTRAVENOUS
  Filled 2023-09-03: qty 1

## 2023-09-03 MED ORDER — CETIRIZINE HCL 10 MG/ML IV SOLN
10.0000 mg | Freq: Once | INTRAVENOUS | Status: AC
  Administered 2023-09-03: 10 mg via INTRAVENOUS
  Filled 2023-09-03: qty 1

## 2023-09-03 MED ORDER — PALONOSETRON HCL INJECTION 0.25 MG/5ML
0.2500 mg | Freq: Once | INTRAVENOUS | Status: AC
  Administered 2023-09-03: 0.25 mg via INTRAVENOUS
  Filled 2023-09-03: qty 5

## 2023-09-03 MED ORDER — HEPARIN SOD (PORK) LOCK FLUSH 100 UNIT/ML IV SOLN
500.0000 [IU] | Freq: Once | INTRAVENOUS | Status: AC | PRN
  Administered 2023-09-03: 500 [IU]

## 2023-09-03 MED ORDER — SODIUM CHLORIDE 0.9 % IV SOLN: INTRAVENOUS | Status: DC

## 2023-09-03 MED ORDER — POTASSIUM CHLORIDE CRYS ER 20 MEQ PO TBCR
40.0000 meq | EXTENDED_RELEASE_TABLET | Freq: Once | ORAL | Status: AC
  Administered 2023-09-03: 40 meq via ORAL
  Filled 2023-09-03: qty 2

## 2023-09-03 MED ORDER — DEXAMETHASONE SODIUM PHOSPHATE 10 MG/ML IJ SOLN
10.0000 mg | Freq: Once | INTRAMUSCULAR | Status: AC
  Administered 2023-09-03: 10 mg via INTRAVENOUS
  Filled 2023-09-03: qty 1

## 2023-09-03 NOTE — Progress Notes (Signed)
 [facility] [address], [place] [address]    Clinic Day:  09/03/2023  Referring physician: [name], MD  Patient Care Team: [name], MD as PCP - General (Family Medicine) Shaaron, Lamar HERO, MD (Gastroenterology) Shannon Agent, MD as Consulting Physician (Radiation Oncology) [name], MD as Consulting Physician (General Surgery) Glean Stephane BROCKS, RN (Inactive) as Oncology Nurse Navigator Tyree Nanetta SAILOR, RN as Oncology Nurse Navigator [name], MD as Medical Oncologist (Medical Oncology) Celestia Joesph SQUIBB, RN as Oncology Nurse Navigator (Medical Oncology)   ASSESSMENT & PLAN:   Assessment: 1.  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  Noticed skin changes in the right breast 3 months ago. - Mammogram (06/19/2022): Large 8-9 cm mass in the right breast with 2 abnormal axillary lymph nodes and skin thickening. 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  1.8 cm ill-defined enhancing mass in the lateral left breast suspicious for additional malignancy.  Right breast mass involves right chest wall including pectoralis and intercostal muscles.  Prominent subcentimeter left axillary lymph nodes. - PET scan (07/12/2022): Intensely hypermetabolic right breast mass with right axillary lymph nodes.  Hypermetabolic metastatic lymph node in the posterior triangle of the right neck.  Hypermetabolic left axillary lymph nodes.  No evidence of metastatic adenopathy in the mediastinum/abdomen/pelvis.  No evidence of visceral metastasis.  Multiple skeletal  metastasis: Left sacral ala 2.5 cm, left iliac bone, L4 and T12 vertebral bodies, multiple rib lesions, left scapular lesion. - She was evaluated by Dr. Loretha and was recommended treatment with ribociclib  and anastrozole . - Germline mutation testing: Negative. - Anastrozole  and ribociclib  started on 07/25/2022, discontinued on 12/13/2022 due to progression. - PET scan (11/15/2022): Mixed response to therapy with overall progression of disease with new bone metastasis and liver metastasis.  New periportal lymph nodes. - Liver biopsy (12/07/2022): Metastatic poorly differentiated adenocarcinoma consistent with breast primary.  ER +2% with moderate staining, PR 0%, HER2 (1+). - Guardant360: APC G1312, T p53, MYC amplification, FGF R2 amplification, EGFR amplification - PD-L1 (22 C3): 0% - Abraxane  day 1, day 8 every 21 days started on 12/21/2022, carboplatin  AUC on day 1 and day 8 added with cycle 2 on 01/10/2023, discontinued on 05/30/2023 due to progression. - NGS (12/07/2022): MS-stable, TMB-low, PTEN pathogenic variant, genomic LOH high (18%), HER2 (1+), PD-L1 (22 C3) CPS-10. - Sacituzumab cycle 1 started on 06/10/2023   2.  Social/family history: - She lives at home with her husband.  Used OCP for approximately 16-18 years.  Underwent hysterectomy after endometrial ablation when she was found to have fibroids causing pain.  No family history of breast or other malignancies.  Smokes 1 pack of cigarettes per day.    Plan: 1.  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  New increased hypermetabolic nodularity along the right breast cutaneous surface. - We started her on Sacituzumab which she completed 4 cycles. - Her tumor markers were trending down except the last 1 on 08/20/2023 has gone up slightly. - We reviewed PET scan from 08/29/2023: Significant improvement  in the right breast carcinoma as well as hepatic metastatic disease.   Some new areas of uptake in the pelvis.  These are most likely from G-CSF.  However will closely monitor.  If there is any significant increase in tumor markers, will consider repeating another PET scan after 3 more cycles. - Labs today: Normal LFTs.  CBC grossly normal with mild anemia stable. - She may proceed with cycle 5 of Sacituzumab today at 7.5 mg/kg.  RTC 3 weeks for follow-up.  2.  Bone metastasis: - Calcium is 8.3 with albumin 3.3.  She will continue Zometa  every 12 weeks.  No side effects noted.  3.  Hypomagnesemia: - Continue magnesium  twice daily.  Magnesium  is 1.7.  4.  Leg swelling: - Continue Lasix  20 mg in the mornings daily as needed.  She is requiring twice weekly.  5.  Hypokalemia: - Potassium is consistently low.  Will start her on 20 mEq daily.     Orders Placed This Encounter  Procedures   Cancer antigen 15-3    Standing Status:   Future    Expected Date:   09/10/2023    Expiration Date:   09/09/2024   Cancer antigen 27.29    Standing Status:   Future    Expected Date:   09/10/2023    Expiration Date:   09/09/2024   CBC with Differential    Standing Status:   Future    Expected Date:   09/10/2023    Expiration Date:   09/09/2024   Comprehensive metabolic panel    Standing Status:   Future    Expected Date:   09/10/2023    Expiration Date:   09/09/2024   Magnesium     Standing Status:   Future    Expected Date:   09/10/2023    Expiration Date:   09/09/2024   Phosphorus    Standing Status:   Future    Expected Date:   09/10/2023    Expiration Date:   09/09/2024      LILLETTE Hummingbird R Teague,acting as a scribe for Alean Stands, MD.,have documented all relevant documentation on the behalf of Alean Stands, MD,as directed by  Alean Stands, MD while in the presence of Alean Stands, MD.  I, Alean Stands MD, have reviewed the above documentation for accuracy and completeness, and I agree with the above.       Alean Stands, MD    7/29/20253:01 PM  CHIEF COMPLAINT:   Diagnosis: metastatic inflammatory right breast cancer    Cancer Staging  Malignant neoplasm of upper-outer quadrant of right breast in female, estrogen receptor positive (HCC) Staging form: Breast, AJCC 8th Edition - Clinical: Stage IV (cT4, cN2, cM1, G2, ER+, PR+, HER2-) - Signed by Iruku, Praveena, MD on 07/17/2022    Prior Therapy: Carboplatin , Abraxane  and Keytruda   Current Therapy: Sacituzumab govitecan    HISTORY OF PRESENT ILLNESS:   Oncology History  Malignant neoplasm of upper-outer quadrant of right breast in female, estrogen receptor positive (HCC)  06/19/2022 Mammogram   Mammogram showed large mass in the right breast which appears to measure 8 to 9 cm on ultrasound, 2 abnormal right axillary lymph node and a third borderline lymph node.  Skin thickening noted along the medial aspect of left breast which may be an extension of the marked skin thickening diffusely on the right.  Otherwise no left breast abnormalities are identified.   06/22/2022 Pathology Results   Needle core biopsy from the right breast upper outer quadrant showed grade 2 IDC,  both lymph nodes with metastatic adenocarcinoma.  Prognostic showed ER 90% positive strong staining PR 70% positive strong staining Ki-67 of 20% HER2 negative by IHC 1+   07/03/2022 Initial Diagnosis   Malignant neoplasm of upper-outer quadrant of right breast in female, estrogen receptor positive (HCC)   07/04/2022 Cancer Staging   Staging form: Breast, AJCC 8th Edition - Clinical: Stage IV (cT4, cN2, cM1, G2, ER+, PR+, HER2-) - Signed by Loretha Ash, MD on 07/17/2022 Stage prefix: Initial diagnosis Histologic grading system: 3 grade system   07/16/2022 - 07/16/2022 Chemotherapy   Patient is on Treatment Plan : BREAST ADJUVANT DOSE DENSE AC q14d / PACLitaxel  q7d      Genetic Testing   [facility] Custom Panel+RNA was Negative. Report date is 07/10/2022.  The Custom Hereditary Cancers Panel  offered by [facility] includes sequencing and/or deletion duplication testing of the following 43 genes: APC, ATM, AXIN2, BAP1, BARD1, BMPR1A, BRCA1, BRCA2, BRIP1, CDH1, CDK4, CDKN2A (p14ARF and p16INK4a only), CHEK2, CTNNA1, EPCAM (Deletion/duplication testing only), FH, GREM1 (promoter region duplication testing only), HOXB13, KIT, MBD4, MEN1, MLH1, MSH2, MSH3, MSH6, MUTYH, NF1, NHTL1, PALB2, PDGFRA, PMS2, POLD1, POLE, PTEN, RAD51C, RAD51D, SMAD4, SMARCA4. STK11, TP53, TSC1, TSC2, and VHL.   12/13/2022 - 12/13/2022 Chemotherapy   Patient is on Treatment Plan : BREAST METASTATIC Fam-Trastuzumab Deruxtecan-nxki (Enhertu) (5.4) q21d     12/21/2022 - 05/23/2023 Chemotherapy   Patient is on Treatment Plan : BREAST Paclitaxel -Albumin (Abraxane ) (100) D1,8 + Carboplatin  AUC2 D1,8 every 3 weeks, q21d     06/10/2023 -  Chemotherapy   Patient is on Treatment Plan : BREAST Sacituzumab govitecan -hziy (Trodelvy ) D1,8 q21d        INTERVAL HISTORY:   Monica Soto is a 58 y.o. female presenting to clinic today for follow up of metastatic inflammatory right breast cancer. She was last seen by me on 08/13/2023.  Since her last visit, she underwent restaging PET on 08/29/2023 that found: Marked interval improvement in RIGHT breast carcinoma. Marked interval improvement in hepatic metastasis. Mixed response in skeletal metastasis. Several lesions are improved compared to prior where several lesions are new. Persistent widespread skeletal metastasis.  Today, she states that she is doing well overall. Her appetite level is at 75%. Her energy level is at 80%. Brenya notes occasional back pain that feels similar to a pulled muscle. Pain is not new in onset and she states she brought in groceries a few days ago which could be attributing to the pain.   Makenah does not take potassium supplements. She requires lasix  twice a week and is taking magnesium  BID. She denies any dental issues or jaw pain due to Zometa .   Symptoms from treatment  have not worsened and remain the same pertaining to tiredness lasting 2 days after treatment, diarrhea controlled with imodium lasting 4 days after G-CSF injection, and mild nausea.   She denies any vision changes, though she does report a headache she attributes to sinusitis.   PAST MEDICAL HISTORY:   Past Medical History: Past Medical History:  Diagnosis Date   Depression    Diabetes mellitus without complication (HCC)    Erosive esophagitis 04/26/2010   EGD by Dr. Shaaron, small hiatal hernia   GERD (gastroesophageal reflux disease)    History of stomach ulcers    IBS (irritable bowel syndrome)    Diarrhea predominant   Microscopic colitis 04/26/2010   Colonoscopy by Dr. Shaaron, good response with Entocort   Tubular adenoma of colon 04/26/2010   Junction of descending and sigmoid  40 CM from anus    Surgical History: Past Surgical History:  Procedure Laterality Date   ABDOMINAL HYSTERECTOMY     APPENDECTOMY  2/11   Dr. Floria with a delayed closure   BREAST BIOPSY Right 06/22/2022   US  RT BREAST BX W LOC DEV 1ST LESION IMG BX SPEC US  GUIDE 06/22/2022 [facility] MAMMOGRAPHY   PORTACATH PLACEMENT N/A 07/16/2022   Procedure: INSERTION PORT-A-CATH;  Surgeon: [name], MD;  Location: [facility];  Service: General;  Laterality: N/A;   TONSILLECTOMY     TUBAL LIGATION      Social History: Social History   Socioeconomic History   Marital status: Married    Spouse name: Not on file   Number of children: Not on file   Years of education: Not on file   Highest education level: Not on file  Occupational History    Employer: [employer]    Comment: Third shift  Tobacco Use   Smoking status: Every Day    Current packs/day: 0.50    Types: Cigarettes   Smokeless tobacco: Never  Vaping Use   Vaping status: Never Used  Substance and Sexual Activity   Alcohol use: Yes    Alcohol/week: 1.0 standard drink of alcohol    Types: 1 Glasses of wine per week     Comment: seldom   Drug use: No   Sexual activity: Yes    Birth control/protection: Surgical  Other Topics Concern   Not on file  Social History Narrative   Not on file   Social Drivers of Health   Financial Resource Strain: Not on file  Food Insecurity: No Food Insecurity (07/04/2022)   Hunger Vital Sign    Worried About Running Out of Food in the Last Year: Never true    Ran Out of Food in the Last Year: Never true  Transportation Needs: No Transportation Needs (07/04/2022)   PRAPARE - Administrator, Civil Service (Medical): No    Lack of Transportation (Non-Medical): No  Physical Activity: Not on file  Stress: Not on file  Social Connections: Not on file  Intimate Partner Violence: Not on file    Family History: Family History  Problem Relation Age of Onset   Diabetes Mother    Coronary artery disease Mother    Healthy Father     Current Medications:  Current Outpatient Medications:    Aspirin-Acetaminophen -Caffeine (GOODY HEADACHE PO), Take 1 packet by mouth 2 (two) times daily as needed (for pain)., Disp: , Rfl:    Cholecalciferol 1.25 MG (50000 UT) capsule, Take 1 capsule by mouth once a week., Disp: , Rfl:    diphenoxylate -atropine  (LOMOTIL ) 2.5-0.025 MG tablet, Take 2 tablets after first loose stool and 1 after each loose stool thereafter.  Do not exceed 8 tablets in 1 day., Disp: 30 tablet, Rfl: 3   fluticasone  (FLONASE ) 50 MCG/ACT nasal spray, Place 1 spray into both nostrils daily., Disp: 16 g, Rfl: 0   furosemide  (LASIX ) 20 MG tablet, TAKE 1 TABLET(20 MG) BY MOUTH DAILY AS NEEDED FOR SWELLING, Disp: 30 tablet, Rfl: 2   HYDROcodone -acetaminophen  (NORCO/VICODIN) 5-325 MG tablet, Take 1 tablet by mouth every 6 (six) hours as needed for moderate pain., Disp: 15 tablet, Rfl: 0   lidocaine -prilocaine  (EMLA ) cream, APPLY TOPICALLY TO THE AFFECTED AREA 1 TIME, Disp: 30 g, Rfl: 3   magnesium  oxide (MAG-OX) 400 (240 Mg) MG tablet, Take 1 tablet (400 mg total) by  mouth 2 (two) times daily.,  Disp: 60 tablet, Rfl: 3   ondansetron  (ZOFRAN ) 8 MG tablet, TAKE 1 TABLET(8 MG) BY MOUTH EVERY 8 HOURS AS NEEDED FOR NAUSEA OR VOMITING. START THIRD DAY AFTER DOXORUBICIN/ CYCLOPHOSPHAMIDE CHEMOTHERAPY, Disp: 30 tablet, Rfl: 2   prochlorperazine  (COMPAZINE ) 10 MG tablet, Take 1 tablet (10 mg total) by mouth every 6 (six) hours as needed for nausea or vomiting., Disp: 30 tablet, Rfl: 2 No current facility-administered medications for this visit.  Facility-Administered Medications Ordered in Other Visits:    0.9 %  sodium chloride  infusion, , Intravenous, Continuous, [name], MD, Stopped at 09/03/23 1212   sodium chloride  flush (NS) 0.9 % injection 10 mL, 10 mL, Intracatheter, PRN, Mirah Nevins, MD, 10 mL at 09/03/23 1212   Allergies: No Known Allergies  REVIEW OF SYSTEMS:   Review of Systems  Constitutional:  Negative for chills, fatigue and fever.  HENT:   Negative for lump/mass, mouth sores, nosebleeds, sore throat and trouble swallowing.   Eyes:  Negative for eye problems.  Respiratory:  Positive for cough. Negative for shortness of breath.   Cardiovascular:  Negative for chest pain, leg swelling and palpitations.  Gastrointestinal:  Positive for diarrhea. Negative for abdominal pain, constipation, nausea and vomiting.  Genitourinary:  Negative for bladder incontinence, difficulty urinating, dysuria, frequency, hematuria and nocturia.   Musculoskeletal:  Negative for arthralgias, back pain, flank pain, myalgias and neck pain.  Skin:  Negative for itching and rash.  Neurological:  Positive for headaches and numbness (in toes). Negative for dizziness.  Hematological:  Does not bruise/bleed easily.  Psychiatric/Behavioral:  Negative for depression, sleep disturbance and suicidal ideas. The patient is not nervous/anxious.   All other systems reviewed and are negative.    VITALS:   Blood pressure 116/68, pulse (!) 105, temperature 98.1 F  (36.7 C), temperature source Oral, resp. rate 16, weight 153 lb 10.6 oz (69.7 kg), SpO2 100%.  Wt Readings from Last 3 Encounters:  09/03/23 153 lb 10.6 oz (69.7 kg)  08/20/23 155 lb 13.8 oz (70.7 kg)  08/13/23 154 lb 12.2 oz (70.2 kg)    Body mass index is 25.57 kg/m.  Performance status (ECOG): 1 - Symptomatic but completely ambulatory   PHYSICAL EXAM:   Physical Exam Vitals and nursing note reviewed. Exam conducted with a chaperone present.  Constitutional:      Appearance: Normal appearance.  Cardiovascular:     Rate and Rhythm: Normal rate and regular rhythm.     Pulses: Normal pulses.     Heart sounds: Normal heart sounds.  Pulmonary:     Effort: Pulmonary effort is normal.     Breath sounds: Normal breath sounds.  Abdominal:     Palpations: Abdomen is soft. There is no hepatomegaly, splenomegaly or mass.     Tenderness: There is no abdominal tenderness.  Musculoskeletal:     Right lower leg: No edema.     Left lower leg: No edema.  Lymphadenopathy:     Cervical: No cervical adenopathy.     Right cervical: No superficial, deep or posterior cervical adenopathy.    Left cervical: No superficial, deep or posterior cervical adenopathy.     Upper Body:     Right upper body: No supraclavicular or axillary adenopathy.     Left upper body: No supraclavicular or axillary adenopathy.  Neurological:     General: No focal deficit present.     Mental Status: She is alert and oriented to person, place, and time.  Psychiatric:  Mood and Affect: Mood normal.        Behavior: Behavior normal.     LABS:      Latest Ref Rng & Units 09/03/2023    7:53 AM 08/20/2023    8:08 AM 08/13/2023    8:29 AM  CBC  WBC 4.0 - 10.5 K/uL 10.2  4.1  7.9   Hemoglobin 12.0 - 15.0 g/dL 9.1  8.5  8.5   Hematocrit 36.0 - 46.0 % 29.0  26.9  26.6   Platelets 150 - 400 K/uL 172  165  166       Latest Ref Rng & Units 09/03/2023    7:53 AM 08/20/2023    8:08 AM 08/13/2023    8:29 AM  CMP   Glucose 70 - 99 mg/dL 830  864  862   BUN 6 - 20 mg/dL 9  7  7    Creatinine 0.44 - 1.00 mg/dL 9.33  9.45  9.43   Sodium 135 - 145 mmol/L 135  138  138   Potassium 3.5 - 5.1 mmol/L 3.2  3.3  3.6   Chloride 98 - 111 mmol/L 103  107  109   CO2 22 - 32 mmol/L 21  20  21    Calcium 8.9 - 10.3 mg/dL 8.3  8.1  8.0   Total Protein 6.5 - 8.1 g/dL 6.9  6.2  6.6   Total Bilirubin 0.0 - 1.2 mg/dL 0.7  0.3  0.5   Alkaline Phos 38 - 126 U/L 178  153  188   AST 15 - 41 U/L 18  19  16    ALT 0 - 44 U/L 12  10  10       No results found for: CEA1, CEA / No results found for: CEA1, CEA No results found for: PSA1 No results found for: CAN199 No results found for: CAN125  No results found for: TOTALPROTELP, ALBUMINELP, A1GS, A2GS, BETS, BETA2SER, GAMS, MSPIKE, SPEI No results found for: TIBC, FERRITIN, IRONPCTSAT No results found for: LDH   STUDIES:   NM PET Image Restag (PS) Skull Base To Thigh Result Date: 09/02/2023 CLINICAL DATA:  Subsequent treatment strategy for RIGHT breast carcinoma. EXAM: NUCLEAR MEDICINE PET SKULL BASE TO THIGH TECHNIQUE: 8.45 mCi F-18 FDG was injected intravenously. Full-ring PET imaging was performed from the skull base to thigh after the radiotracer. CT data was obtained and used for attenuation correction and anatomic localization. Fasting blood glucose: 101 mg/dl COMPARISON:  FDG PET scan 05/30/2023 FINDINGS: NECK: No hypermetabolic lymph nodes in the neck. Incidental CT findings: None. CHEST: Decreased metabolic activity within the RIGHT breast. Previous large region intense metabolic activity along the skin surface is contracted too much smaller region with less radiotracer activity. Residual activity SUV max equal 3.8 on image 72 decreased from a large lesion with SUV max equal 9.0 No axillary adenopathy. No mediastinal adenopathy. No suspicious pulmonary nodules. Incidental CT findings: Port in the anterior chest wall with tip in  distal SVC. ABDOMEN/PELVIS: Marked interval improvement in central hepatic metastasis. Large confluent lesion in the central LEFT and RIGHT hepatic lobe is reduced in volume. There is residual metabolic activity liver with SUV max equal 5.7 compared to a large lesion centrally with SUV max equal 9.1. No new metabolic activity abdomen pelvis.  Metastatic adenopathy. Incidental CT findings: None. SKELETON: There is mixed response within the skeleton. Several lesions are improved compared to prior where several lesions are new compared to prior within the skeleton. For example in the RIGHT  sacral ala intense radiotracer activity with SUV max equal 7.9 is essentially new from comparison exam. Lesions in the lumbar spine are increased from comparison exam. Lesion thoracic spine are improved from comparison exam. New lesion in the LEFT clavicle head for example with SUV max equal 6.5. Increased activity within the long bones of the humeri and femurs. Incidental CT findings: None. IMPRESSION: 1. Marked interval improvement in RIGHT breast carcinoma. 2. Marked interval improvement in hepatic metastasis. 3. Mixed response in skeletal metastasis. Several lesions are improved compared to prior where several lesions are new. Persistent widespread skeletal metastasis. Electronically Signed   By: Jackquline Boxer M.D.   On: 09/02/2023 11:58

## 2023-09-03 NOTE — Progress Notes (Signed)

## 2023-09-03 NOTE — Progress Notes (Signed)
 Patient has been examined by Dr. Ellin Saba. Vital signs and labs have been reviewed by MD - ANC, Creatinine, LFTs, hemoglobin, and platelets are within treatment parameters per M.D. - pt may proceed with treatment.  Primary RN and pharmacy notified.

## 2023-09-03 NOTE — Patient Instructions (Addendum)
 [facility] at [facility] Discharge Instructions   You were seen and examined today by Dr. Rogers.  He reviewed the results of your lab work which are normal/stable.   He reviewed the results of your PET scan. It is showing great improvement in the breast mass and the spots in the liver. It is showing some areas of the disease in the bone have improved while there are also several new areas in bones. Dr. MARLA believes the new areas in the bone are likely coming from the white blood cell booster shot.   We will proceed with your treatment today.   Return as scheduled.    Thank you for choosing [facility] at [facility] to provide your oncology and hematology care.  To afford each patient quality time with our provider, please arrive at least 15 minutes before your scheduled appointment time.   If you have a lab appointment with the Cancer Center please come in thru the Main Entrance and check in at the main information desk.  You need to re-schedule your appointment should you arrive 10 or more minutes late.  We strive to give you quality time with our providers, and arriving late affects you and other patients whose appointments are after yours.  Also, if you no show three or more times for appointments you may be dismissed from the clinic at the providers discretion.     Again, thank you for choosing [facility].  Our hope is that these requests will decrease the amount of time that you wait before being seen by our physicians.       _____________________________________________________________  Should you have questions after your visit to [facility], please contact our office at [phone] and follow the prompts.  Our office hours are 8:00 a.m. and 4:30 p.m. Monday - Friday.  Please note that voicemails left after 4:00 p.m. may not be returned until the following business day.  We are closed weekends and major  holidays.  You do have access to a nurse 24-7, just call the main number to the clinic [phone] and do not press any options, hold on the line and a nurse will answer the phone.    For prescription refill requests, have your pharmacy contact our office and allow 72 hours.    Due to Covid, you will need to wear a mask upon entering the hospital. If you do not have a mask, a mask will be given to you at the Main Entrance upon arrival. For doctor visits, patients may have 1 support person age 9 or older with them. For treatment visits, patients can not have anyone with them due to social distancing guidelines and our immunocompromised population.

## 2023-09-03 NOTE — Patient Instructions (Signed)
 [facility] - A DEPT OF [facility]  Discharge Instructions: Thank you for choosing [facility] to provide your oncology and hematology care.  If you have a lab appointment with the Cancer Center - please note that after April 8th, 2024, all labs will be drawn in the cancer center.  You do not have to check in or register with the main entrance as you have in the past but will complete your check-in in the cancer center.  Wear comfortable clothing and clothing appropriate for easy access to any Portacath or PICC line.   We strive to give you quality time with your provider. You may need to reschedule your appointment if you arrive late (15 or more minutes).  Arriving late affects you and other patients whose appointments are after yours.  Also, if you miss three or more appointments without notifying the office, you may be dismissed from the clinic at the provider's discretion.      For prescription refill requests, have your pharmacy contact our office and allow 72 hours for refills to be completed.    Today you received the following chemotherapy and/or immunotherapy agents trodelvy .       To help prevent nausea and vomiting after your treatment, we encourage you to take your nausea medication as directed.  BELOW ARE SYMPTOMS THAT SHOULD BE REPORTED IMMEDIATELY: *FEVER GREATER THAN 100.4 F (38 C) OR HIGHER *CHILLS OR SWEATING *NAUSEA AND VOMITING THAT IS NOT CONTROLLED WITH YOUR NAUSEA MEDICATION *UNUSUAL SHORTNESS OF BREATH *UNUSUAL BRUISING OR BLEEDING *URINARY PROBLEMS (pain or burning when urinating, or frequent urination) *BOWEL PROBLEMS (unusual diarrhea, constipation, pain near the anus) TENDERNESS IN MOUTH AND THROAT WITH OR WITHOUT PRESENCE OF ULCERS (sore throat, sores in mouth, or a toothache) UNUSUAL RASH, SWELLING OR PAIN  UNUSUAL VAGINAL DISCHARGE OR ITCHING   Items with * indicate a potential emergency and should be followed  up as soon as possible or go to the Emergency Department if any problems should occur.  Please show the CHEMOTHERAPY ALERT CARD or IMMUNOTHERAPY ALERT CARD at check-in to the Emergency Department and triage nurse.  Should you have questions after your visit or need to cancel or reschedule your appointment, please contact [facility] [place] - A DEPT OF [facility] [phone]  and follow the prompts.  Office hours are 8:00 a.m. to 4:30 p.m. Monday - Friday. Please note that voicemails left after 4:00 p.m. may not be returned until the following business day.  We are closed weekends and major holidays. You have access to a nurse at all times for urgent questions. Please call the main number to the clinic [phone] and follow the prompts.  For any non-urgent questions, you may also contact your provider using MyChart. We now offer e-Visits for anyone 84 and older to request care online for non-urgent symptoms. For details visit [email or web address].   Also download the MyChart app! Go to the app store, search MyChart, open the app, select [place], and log in with your MyChart username and password.

## 2023-09-03 NOTE — Progress Notes (Signed)
 KCl PO 40mEq x1 entered per protocol for K=3.2.  Aliyanna Wassmer, PharmD, MBA

## 2023-09-04 ENCOUNTER — Other Ambulatory Visit: Payer: Self-pay

## 2023-09-10 ENCOUNTER — Inpatient Hospital Stay: Attending: Hematology and Oncology

## 2023-09-10 ENCOUNTER — Inpatient Hospital Stay

## 2023-09-10 VITALS — BP 117/81 | HR 76 | Temp 98.2°F | Resp 18 | Ht 65.0 in | Wt 157.0 lb

## 2023-09-10 DIAGNOSIS — C787 Secondary malignant neoplasm of liver and intrahepatic bile duct: Secondary | ICD-10-CM | POA: Insufficient documentation

## 2023-09-10 DIAGNOSIS — R5383 Other fatigue: Secondary | ICD-10-CM | POA: Diagnosis not present

## 2023-09-10 DIAGNOSIS — N6320 Unspecified lump in the left breast, unspecified quadrant: Secondary | ICD-10-CM | POA: Diagnosis not present

## 2023-09-10 DIAGNOSIS — Z5189 Encounter for other specified aftercare: Secondary | ICD-10-CM | POA: Diagnosis not present

## 2023-09-10 DIAGNOSIS — D72829 Elevated white blood cell count, unspecified: Secondary | ICD-10-CM | POA: Insufficient documentation

## 2023-09-10 DIAGNOSIS — C50411 Malignant neoplasm of upper-outer quadrant of right female breast: Secondary | ICD-10-CM

## 2023-09-10 DIAGNOSIS — C7951 Secondary malignant neoplasm of bone: Secondary | ICD-10-CM | POA: Insufficient documentation

## 2023-09-10 DIAGNOSIS — Z170 Estrogen receptor positive status [ER+]: Secondary | ICD-10-CM | POA: Diagnosis not present

## 2023-09-10 DIAGNOSIS — R197 Diarrhea, unspecified: Secondary | ICD-10-CM | POA: Diagnosis not present

## 2023-09-10 DIAGNOSIS — R309 Painful micturition, unspecified: Secondary | ICD-10-CM | POA: Insufficient documentation

## 2023-09-10 DIAGNOSIS — C773 Secondary and unspecified malignant neoplasm of axilla and upper limb lymph nodes: Secondary | ICD-10-CM | POA: Insufficient documentation

## 2023-09-10 DIAGNOSIS — R052 Subacute cough: Secondary | ICD-10-CM | POA: Insufficient documentation

## 2023-09-10 DIAGNOSIS — Z79899 Other long term (current) drug therapy: Secondary | ICD-10-CM | POA: Insufficient documentation

## 2023-09-10 DIAGNOSIS — Z5112 Encounter for antineoplastic immunotherapy: Secondary | ICD-10-CM | POA: Insufficient documentation

## 2023-09-10 DIAGNOSIS — K5900 Constipation, unspecified: Secondary | ICD-10-CM | POA: Insufficient documentation

## 2023-09-10 DIAGNOSIS — Z1732 Human epidermal growth factor receptor 2 negative status: Secondary | ICD-10-CM | POA: Insufficient documentation

## 2023-09-10 DIAGNOSIS — D508 Other iron deficiency anemias: Secondary | ICD-10-CM | POA: Insufficient documentation

## 2023-09-10 DIAGNOSIS — Z1721 Progesterone receptor positive status: Secondary | ICD-10-CM | POA: Diagnosis not present

## 2023-09-10 LAB — PHOSPHORUS: Phosphorus: 3.2 mg/dL (ref 2.5–4.6)

## 2023-09-10 LAB — COMPREHENSIVE METABOLIC PANEL WITH GFR
ALT: 13 U/L (ref 0–44)
AST: 18 U/L (ref 15–41)
Albumin: 3.2 g/dL — ABNORMAL LOW (ref 3.5–5.0)
Alkaline Phosphatase: 157 U/L — ABNORMAL HIGH (ref 38–126)
Anion gap: 9 (ref 5–15)
BUN: 7 mg/dL (ref 6–20)
CO2: 22 mmol/L (ref 22–32)
Calcium: 8.4 mg/dL — ABNORMAL LOW (ref 8.9–10.3)
Chloride: 107 mmol/L (ref 98–111)
Creatinine, Ser: 0.61 mg/dL (ref 0.44–1.00)
GFR, Estimated: >60 mL/min (ref >60)
Glucose, Bld: 113 mg/dL — ABNORMAL HIGH (ref 70–99)
Potassium: 3.5 mmol/L (ref 3.5–5.1)
Sodium: 138 mmol/L (ref 135–145)
Total Bilirubin: 0.6 mg/dL (ref 0.0–1.2)
Total Protein: 6.6 g/dL (ref 6.5–8.1)

## 2023-09-10 LAB — CBC WITH DIFFERENTIAL/PLATELET
Abs Immature Granulocytes: 0.05 K/uL (ref 0.00–0.07)
Basophils Absolute: 0 K/uL (ref 0.0–0.1)
Basophils Relative: 1 %
Eosinophils Absolute: 0.1 K/uL (ref 0.0–0.5)
Eosinophils Relative: 2 %
HCT: 27.8 % — ABNORMAL LOW (ref 36.0–46.0)
Hemoglobin: 8.7 g/dL — ABNORMAL LOW (ref 12.0–15.0)
Immature Granulocytes: 2 %
Lymphocytes Relative: 36 %
Lymphs Abs: 1.2 K/uL (ref 0.7–4.0)
MCH: 32.5 pg (ref 26.0–34.0)
MCHC: 31.3 g/dL (ref 30.0–36.0)
MCV: 103.7 fL — ABNORMAL HIGH (ref 80.0–100.0)
Monocytes Absolute: 0.3 K/uL (ref 0.1–1.0)
Monocytes Relative: 10 %
Neutro Abs: 1.6 K/uL — ABNORMAL LOW (ref 1.7–7.7)
Neutrophils Relative %: 49 %
Platelets: 187 K/uL (ref 150–400)
RBC: 2.68 MIL/uL — ABNORMAL LOW (ref 3.87–5.11)
RDW: 18.4 % — ABNORMAL HIGH (ref 11.5–15.5)
WBC: 3.3 K/uL — ABNORMAL LOW (ref 4.0–10.5)
nRBC: 1.2 % — ABNORMAL HIGH (ref 0.0–0.2)

## 2023-09-10 LAB — MAGNESIUM: Magnesium: 1.8 mg/dL (ref 1.7–2.4)

## 2023-09-10 MED ORDER — FAMOTIDINE IN NACL 20-0.9 MG/50ML-% IV SOLN
20.0000 mg | Freq: Once | INTRAVENOUS | Status: AC
  Administered 2023-09-10: 20 mg via INTRAVENOUS
  Filled 2023-09-10: qty 50

## 2023-09-10 MED ORDER — SODIUM CHLORIDE 0.9 % IV SOLN: INTRAVENOUS | Status: DC

## 2023-09-10 MED ORDER — SODIUM CHLORIDE 0.9 % IV SOLN
150.0000 mg | Freq: Once | INTRAVENOUS | Status: AC
  Administered 2023-09-10: 150 mg via INTRAVENOUS
  Filled 2023-09-10: qty 150

## 2023-09-10 MED ORDER — CETIRIZINE HCL 10 MG/ML IV SOLN
10.0000 mg | Freq: Once | INTRAVENOUS | Status: AC
  Administered 2023-09-10: 10 mg via INTRAVENOUS
  Filled 2023-09-10: qty 1

## 2023-09-10 MED ORDER — SODIUM CHLORIDE 0.9 % IV SOLN
7.5000 mg/kg | Freq: Once | INTRAVENOUS | Status: AC
  Administered 2023-09-10: 540 mg via INTRAVENOUS
  Filled 2023-09-10: qty 54

## 2023-09-10 MED ORDER — ATROPINE SULFATE 1 MG/ML IV SOLN
0.5000 mg | Freq: Once | INTRAVENOUS | Status: AC
Start: 2023-09-10 — End: 2023-09-10
  Administered 2023-09-10: 0.5 mg via INTRAVENOUS
  Filled 2023-09-10: qty 1

## 2023-09-10 MED ORDER — SODIUM CHLORIDE 0.9% FLUSH
10.0000 mL | INTRAVENOUS | Status: DC | PRN
  Administered 2023-09-10: 10 mL via INTRAVENOUS

## 2023-09-10 MED ORDER — DEXAMETHASONE SODIUM PHOSPHATE 10 MG/ML IJ SOLN
10.0000 mg | Freq: Once | INTRAMUSCULAR | Status: AC
Start: 2023-09-10 — End: 2023-09-10
  Administered 2023-09-10: 10 mg via INTRAVENOUS
  Filled 2023-09-10: qty 1

## 2023-09-10 MED ORDER — PALONOSETRON HCL INJECTION 0.25 MG/5ML
0.2500 mg | Freq: Once | INTRAVENOUS | Status: AC
  Administered 2023-09-10: 0.25 mg via INTRAVENOUS
  Filled 2023-09-10: qty 5

## 2023-09-10 MED ORDER — ACETAMINOPHEN 325 MG PO TABS
650.0000 mg | ORAL_TABLET | Freq: Once | ORAL | Status: AC
  Administered 2023-09-10: 650 mg via ORAL
  Filled 2023-09-10: qty 2

## 2023-09-10 NOTE — Patient Instructions (Signed)
 [facility] [place] - A DEPT OF [facility]  Discharge Instructions: Thank you for choosing [facility] to provide your oncology and hematology care.  If you have a lab appointment with the Cancer Center - please note that after April 8th, 2024, all labs will be drawn in the cancer center.  You do not have to check in or register with the main entrance as you have in the past but will complete your check-in in the cancer center.  Wear comfortable clothing and clothing appropriate for easy access to any Portacath or PICC line.   We strive to give you quality time with your provider. You may need to reschedule your appointment if you arrive late (15 or more minutes).  Arriving late affects you and other patients whose appointments are after yours.  Also, if you miss three or more appointments without notifying the office, you may be dismissed from the clinic at the provider's discretion.      For prescription refill requests, have your pharmacy contact our office and allow 72 hours for refills to be completed.    Today you received the following chemotherapy and/or immunotherapy agents Trodelvy .   Sacituzumab Govitecan  Injection What is this medication? SACITUZUMAB GOVITECAN  (SAK i TOOZ ue mab GOE vi TEE can) treats breast cancer. It works by blocking a protein that causes cancer cells to grow and multiply. This helps to slow or stop the spread of cancer cells. This medicine may be used for other purposes; ask your health care provider or pharmacist if you have questions. COMMON BRAND NAME(S): TRODELVY  What should I tell my care team before I take this medication? They need to know if you have any of these conditions: Carry the UGT1A1*28 gene Infection Liver disease An unusual or allergic reaction to sacituzumab govitecan , other medications, foods, dyes, or preservatives Pregnant or trying to get pregnant Breast-feeding How should I use this  medication? This medication is injected into a vein. It is given by your care team in a hospital or clinic setting. Talk to your care team about the use of this medication in children. Special care may be needed. Overdosage: If you think you have taken too much of this medicine contact a poison control center or emergency room at once. NOTE: This medicine is only for you. Do not share this medicine with others. What if I miss a dose? Keep appointments for follow-up doses. It is important not to miss your dose. Call your care team if you are unable to keep an appointment. What may interact with this medication? This medication may affect how other medications work, and other medications may affect the way this medication works. Talk with your care team about all of the medications you take. They may suggest changes to your treatment plan to lower the risk of side effects and to make sure your medications work as intended. This list may not describe all possible interactions. Give your health care provider a list of all the medicines, herbs, non-prescription drugs, or dietary supplements you use. Also tell them if you smoke, drink alcohol, or use illegal drugs. Some items may interact with your medicine. What should I watch for while using this medication? This medication may make you feel generally unwell. This is not uncommon as chemotherapy can affect healthy cells as well as cancer cells. Report any side effects. Continue your course of treatment even though you feel ill unless your care team tells you to stop.  You may need blood work while you are taking this medication. Certain genetic factors may decrease the safety of this medication. Your care team may use genetic tests to determine treatment. This medication can cause serious allergic reactions. To reduce your risk, your care team may give you other medications to take before receiving this one. Be sure to follow the directions from your care  team. Check with your care team if you have severe diarrhea, nausea, and vomiting, or if you sweat a lot. The loss of too much body fluid may make it dangerous for you to take this medication. Talk to your care team if you wish to become pregnant or think you might be pregnant. This medication can cause serious birth defects if taken during pregnancy or if you get pregnant within 6 months after stopping treatment. A negative pregnancy test is required before starting this medication. A reliable form of contraception is recommended while taking this medication and for 6 months after stopping treatment. Talk to your care team about reliable forms of contraception. Use a condom during sex and for 3 months after stopping treatment. Tell your care team right away if you think your partner might be pregnant. This medication can cause serious birth defects. Do not breast-feed while taking this medication and for 1 month after stopping therapy. This medication may cause infertility. Talk to your care team if you are concerned about your fertility. This medication may increase your risk of getting an infection. Call your care team for advice if you get a fever, chills, sore throat, or other symptoms of a cold or flu. Do not treat yourself. Try to avoid being around people who are sick. Avoid taking medications that contain aspirin, acetaminophen , ibuprofen , naproxen , or ketoprofen unless instructed by your care team. These medications may hide a fever. This medication may increase blood sugar. The risk may be higher in patients who already have diabetes. Ask your care team what you can do to lower your risk of diabetes while taking this medication. What side effects may I notice from receiving this medication? Side effects that you should report to your care team as soon as possible: Allergic reactions--skin rash, itching, hives, swelling of the face, lips, tongue, or throat Infection--fever, chills, cough, or  sore throat Infusion reactions--chest pain, shortness of breath or trouble breathing, feeling faint or lightheaded Low red blood cell level--unusual weakness or fatigue, dizziness, headache, trouble breathing Severe or prolonged diarrhea Side effects that usually do not require medical attention (report these to your care team if they continue or are bothersome): Constipation Diarrhea Fatigue Hair loss Loss of appetite Nausea Vomiting This list may not describe all possible side effects. Call your doctor for medical advice about side effects. You may report side effects to FDA at 1-800-FDA-1088. Where should I keep my medication? This medication is given in a hospital or clinic. It will not be stored at home. NOTE: This sheet is a summary. It may not cover all possible information. If you have questions about this medicine, talk to your doctor, pharmacist, or health care provider.  2025 Elsevier/Gold Standard (2023-01-07 00:00:00)   To help prevent nausea and vomiting after your treatment, we encourage you to take your nausea medication as directed.  BELOW ARE SYMPTOMS THAT SHOULD BE REPORTED IMMEDIATELY: *FEVER GREATER THAN 100.4 F (38 C) OR HIGHER *CHILLS OR SWEATING *NAUSEA AND VOMITING THAT IS NOT CONTROLLED WITH YOUR NAUSEA MEDICATION *UNUSUAL SHORTNESS OF BREATH *UNUSUAL BRUISING OR BLEEDING *URINARY PROBLEMS (pain or  burning when urinating, or frequent urination) *BOWEL PROBLEMS (unusual diarrhea, constipation, pain near the anus) TENDERNESS IN MOUTH AND THROAT WITH OR WITHOUT PRESENCE OF ULCERS (sore throat, sores in mouth, or a toothache) UNUSUAL RASH, SWELLING OR PAIN  UNUSUAL VAGINAL DISCHARGE OR ITCHING   Items with * indicate a potential emergency and should be followed up as soon as possible or go to the Emergency Department if any problems should occur.  Please show the CHEMOTHERAPY ALERT CARD or IMMUNOTHERAPY ALERT CARD at check-in to the Emergency Department and  triage nurse.  Should you have questions after your visit or need to cancel or reschedule your appointment, please contact [facility] [place] - A DEPT OF [facility] [phone]  and follow the prompts.  Office hours are 8:00 a.m. to 4:30 p.m. Monday - Friday. Please note that voicemails left after 4:00 p.m. may not be returned until the following business day.  We are closed weekends and major holidays. You have access to a nurse at all times for urgent questions. Please call the main number to the clinic [phone] and follow the prompts.  For any non-urgent questions, you may also contact your provider using MyChart. We now offer e-Visits for anyone 79 and older to request care online for non-urgent symptoms. For details visit [email or web address].   Also download the MyChart app! Go to the app store, search "MyChart", open the app, select [facility], and log in with your MyChart username and password.

## 2023-09-10 NOTE — Progress Notes (Signed)
 Patient presents today for chemotherapy infusion of Trodelvy . Patient is in satisfactory condition with no new complaints voiced.  Vital signs are stable.  Labs and VS reviewed and all are within treatment parameters. Magnesium  1.8. We will proceed with treatment per MD orders.  Patient tolerated treatment well and 30 minute post wait time with no complaints voiced.  Patient left ambulatory in stable condition.  Vital signs stable at discharge.  Follow up as scheduled.

## 2023-09-10 NOTE — Progress Notes (Signed)
 Patients port flushed without difficulty.  Good blood return noted with no bruising or swelling noted at site.  Patient remains accessed for treatment.

## 2023-09-10 NOTE — Progress Notes (Signed)
   09/10/23 1000  Spiritual Encounters  Type of Visit Follow up  Care provided to: Patient  Reason for visit Routine spiritual support  OnCall Visit No  Spiritual Care Plan  Spiritual Care Issues Still Outstanding Chaplain will continue to follow   I just made a brief check in with [facility] today as I was rounding.  She presents as her usual positive and friendly self.  She was watching True Crime on the TV and remarked that she is anxious to get home and snuggle up in the bed and binge watch crime shows.  Chaplain will continue to follow Monica Soto and check in with her when she is here.  Monica Soto, MDiv  Chaplain, [facility] [email or web address] [email or web address] [phone]

## 2023-09-11 ENCOUNTER — Inpatient Hospital Stay

## 2023-09-11 VITALS — BP 127/72 | HR 88 | Temp 97.5°F | Resp 20

## 2023-09-11 DIAGNOSIS — Z5112 Encounter for antineoplastic immunotherapy: Secondary | ICD-10-CM | POA: Diagnosis not present

## 2023-09-11 DIAGNOSIS — Z170 Estrogen receptor positive status [ER+]: Secondary | ICD-10-CM

## 2023-09-11 LAB — CANCER ANTIGEN 27.29: CA 27.29: 306.5 U/mL — ABNORMAL HIGH (ref 0.0–38.6)

## 2023-09-11 LAB — CANCER ANTIGEN 15-3: CA 15-3: 218 U/mL — ABNORMAL HIGH (ref 0.0–25.0)

## 2023-09-11 MED ORDER — PEGFILGRASTIM-CBQV 6 MG/0.6ML ~~LOC~~ SOSY
6.0000 mg | PREFILLED_SYRINGE | Freq: Once | SUBCUTANEOUS | Status: AC
  Administered 2023-09-11: 6 mg via SUBCUTANEOUS
  Filled 2023-09-11: qty 0.6

## 2023-09-11 NOTE — Patient Instructions (Signed)

## 2023-09-11 NOTE — Progress Notes (Signed)
 Patient tolerated injection with no complaints voiced.  Site clean and dry with no bruising or swelling noted at site.  See MAR for details.  Band aid applied.  Patient stable during and after injection.  Vss with discharge and left in satisfactory condition with no s/s of distress noted.

## 2023-09-23 ENCOUNTER — Inpatient Hospital Stay

## 2023-09-23 VITALS — BP 124/70 | HR 115 | Temp 98.4°F | Resp 20 | Wt 150.6 lb

## 2023-09-23 DIAGNOSIS — Z95828 Presence of other vascular implants and grafts: Secondary | ICD-10-CM

## 2023-09-23 DIAGNOSIS — Z5112 Encounter for antineoplastic immunotherapy: Secondary | ICD-10-CM | POA: Diagnosis not present

## 2023-09-23 DIAGNOSIS — Z170 Estrogen receptor positive status [ER+]: Secondary | ICD-10-CM

## 2023-09-23 LAB — CBC WITH DIFFERENTIAL/PLATELET
Abs Immature Granulocytes: 0.16 K/uL — ABNORMAL HIGH (ref 0.00–0.07)
Basophils Absolute: 0 K/uL (ref 0.0–0.1)
Basophils Relative: 0 %
Eosinophils Absolute: 0 K/uL (ref 0.0–0.5)
Eosinophils Relative: 0 %
HCT: 29.3 % — ABNORMAL LOW (ref 36.0–46.0)
Hemoglobin: 9 g/dL — ABNORMAL LOW (ref 12.0–15.0)
Immature Granulocytes: 1 %
Lymphocytes Relative: 10 %
Lymphs Abs: 1.1 K/uL (ref 0.7–4.0)
MCH: 32.1 pg (ref 26.0–34.0)
MCHC: 30.7 g/dL (ref 30.0–36.0)
MCV: 104.6 fL — ABNORMAL HIGH (ref 80.0–100.0)
Monocytes Absolute: 0.6 K/uL (ref 0.1–1.0)
Monocytes Relative: 5 %
Neutro Abs: 9.8 K/uL — ABNORMAL HIGH (ref 1.7–7.7)
Neutrophils Relative %: 84 %
Platelets: 167 K/uL (ref 150–400)
RBC: 2.8 MIL/uL — ABNORMAL LOW (ref 3.87–5.11)
RDW: 17.8 % — ABNORMAL HIGH (ref 11.5–15.5)
WBC: 11.8 K/uL — ABNORMAL HIGH (ref 4.0–10.5)
nRBC: 0 % (ref 0.0–0.2)

## 2023-09-23 LAB — COMPREHENSIVE METABOLIC PANEL WITH GFR
ALT: 13 U/L (ref 0–44)
AST: 18 U/L (ref 15–41)
Albumin: 3.1 g/dL — ABNORMAL LOW (ref 3.5–5.0)
Alkaline Phosphatase: 183 U/L — ABNORMAL HIGH (ref 38–126)
Anion gap: 11 (ref 5–15)
BUN: 10 mg/dL (ref 6–20)
CO2: 20 mmol/L — ABNORMAL LOW (ref 22–32)
Calcium: 8.4 mg/dL — ABNORMAL LOW (ref 8.9–10.3)
Chloride: 103 mmol/L (ref 98–111)
Creatinine, Ser: 0.61 mg/dL (ref 0.44–1.00)
GFR, Estimated: >60 mL/min (ref >60)
Glucose, Bld: 135 mg/dL — ABNORMAL HIGH (ref 70–99)
Potassium: 3.5 mmol/L (ref 3.5–5.1)
Sodium: 134 mmol/L — ABNORMAL LOW (ref 135–145)
Total Bilirubin: <0.2 mg/dL (ref 0.0–1.2)
Total Protein: 6.7 g/dL (ref 6.5–8.1)

## 2023-09-23 LAB — MAGNESIUM: Magnesium: 1.9 mg/dL (ref 1.7–2.4)

## 2023-09-23 LAB — PHOSPHORUS: Phosphorus: 2.8 mg/dL (ref 2.5–4.6)

## 2023-09-23 MED ORDER — FLUTICASONE PROPIONATE 50 MCG/ACT NA SUSP: 1.0000 | Freq: Every day | NASAL | 2 refills | Status: AC

## 2023-09-23 MED ORDER — PANTOPRAZOLE SODIUM 40 MG PO TBEC: 40.0000 mg | DELAYED_RELEASE_TABLET | Freq: Every day | ORAL | 2 refills | Status: DC

## 2023-09-23 NOTE — Progress Notes (Signed)
 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 female was transferred to my care after her prior physician has left.   ASSESSMENT & PLAN:   Assessment & Plan: [name]  is a 58 y.o. 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   Liver biopsy at this time showed low ER positive, PR negative and HER2 negative disease. Started on carboplatin  and Abraxane  and added pembrolizumab  and PD-L1 NGS came back as 10. Progressed on PET scan from 05/2023 and patient was started on Sacituzumab govitecan  on 06/10/2023 Recent PET with marked interval improvement in right breast carcinoma but also showed several new skeletal metastasis.  - The mixed response on PET scan was thought to be due to growth factor.  Continuing sacituzumab govitecan  for now proceed with cycle 6-day 1 today. - Labs reviewed today: Creatinine: Normal, alkaline phosphatase slightly elevated from prior.  Normal LFTs.  CBC: Consistent with anemia with a hemoglobin of 9, mild leukocytosis. -Recent CA 15-3, CA 20 7.29:  Trending up. -Discussed with the patient that she is currently on third line of treatment and that her breast cancer is likely behaving like a triple negative breast cancer considering she had very low ER positivity on recent biopsy.  Poor prognosis discussed in detail and that with mixed response on recent PET scan would refer to [facility] for possible clinical trial evaluation and second opinion.  Return to clinic in 3 weeks with labs for follow-up and next cycle of chemotherapy  Burning with urination Patient reported burning with urination today Also has mild leukocytosis  - Will obtain a urinalysis.  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 - Recommended patient to start ferrous sulfate every other day.  Use MiraLAX for constipation Subacute cough Patient reported cough when she lays down to sleep and reports a lot of postnasal discharge Started her on Flonase  and pantoprazole .  Patient reported improvement.  - Continue Flonase , cetirizine  and pantoprazole  as prescribed Diarrhea, unspecified type Patient has treatment induced diarrhea that is well-controlled with liquid Imodium  - Continue liquid Imodium as prescribed Wound of right breast, sequela Patient has breast wound from the breast carcinoma No  signs of infection at this time Patient keeping the area clean and applying dressing on it    The patient understands the  plans discussed today and is in agreement with them.  She knows to contact our office if she develops concerns prior to her next appointment.  60 minutes of total time was spent for this patient encounter, including preparation, face-to-face counseling with the patient and coordination of care, physical exam, and documentation of the encounter. > 50% of the time was spent on counseling as documented under my assessment and plan.   Monica Soto,acting as a Neurosurgeon for Mickiel Dry, MD.,have documented all relevant documentation on the behalf of Mickiel Dry, MD,as directed by  Mickiel Dry, MD while in the presence of Mickiel Dry, MD.  I, Mickiel Dry MD, have reviewed the above documentation for accuracy and completeness, and I agree with the above.     Mickiel Dry, MD  [facility] [place] - A DEPT OF [facility] [address] [place] [address] Dept: [phone] Dept Fax: [phone]   Orders Placed This Encounter  Procedures   Urine culture   Urinalysis, Routine w reflex microscopic   Iron and TIBC ([facility] ONLY)   Ferritin   Vitamin B12   Folate     ONCOLOGY HISTORY:   I have reviewed her chart and materials related to her cancer extensively and collaborated history with the patient. 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 There are 2 abnormal right axillary lymph nodes, and a third borderline lymph node. Skin thickening is noted along the medial aspect of the left breast, which may be an extension of the marked skin thickening diffusely on the right. -06/22/2022: Right breast biopsy. Pathology: Invasive moderately differentiated ductal adenocarcinoma, grade 2. ER positive (90%), PR  positive (70%), Her2 negative (+1). Ki-67: 20%. 2/2 Right axillary lymph nodes positive for metastatic adenocarcinoma.  -07/12/2022: Initial PET: Intensely hypermetabolic RIGHT breast mass consistent with primary breast carcinoma. Hypermetabolic metastatic RIGHT axillary lymph nodes. Hypermetabolic metastatic lymph node in the posterior triangle of the RIGHT neck. Hypermetabolic LEFT axillary lymph nodes. No evidence of metastatic adenopathy in the mediastinum or abdomen pelvis. No evidence of visceral metastasis. Multiple intensely hypermetabolic skeletal metastasis. -07/04/2022: [facility]:Germline testing : Negative. 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 Abnormal enhancement extends posteriorly to broadly and deeply involve the right chest wall including the pectoralis and underlying intercostal muscles with a small area of abnormal enhancement extending to the underlying pleura. Enhancement extends medially to the right internal mammary chain. 1.8 cm ill-defined enhancing mass in the lateral left breast suspicious for additional malignancy. Left medial breast skin thickening and underlying trabecular thickening with associated abnormal enhancement suspicious for contralateral extension of inflammatory breast carcinoma. Prominent subcentimeter left axillary lymph nodes.  -07/16/2022: Port placement -07/25/2022-12/13/2022: Anastrozole  and ribociclib  treatment, discontinued due to progression 07/27/2022- Current: Zometa  IV 4 mg every 3 months. -08/08/2022: CA 15-3: 201.0. CA 27.29: 337.7. -11/15/2022: PET:  New hypermetabolic skeletal metastasis.  New hypermetabolic liver metastasis. New hypermetabolic periportal lymph nodes. New hypermetabolic cutaneous tissue in the RIGHT breast. New hypermetabolic glandular tissue in the LEFT breast. Persistent  hypermetabolic tissue in the RIGHT breast with extension to the skin surface. -12/01/2022: [employer]: APC G1312, TP53, MYC amplification, FGFR2 amplification, EGFR amplification  -12/07/2022: Liver biopsy. Pathology: Metastatic poorly differentiated adenocarcinoma consistent with breast primary.  ER: Positive, 2%, PR: Negative, 0%, HER2: Negative(1+) -12/17/2022:  PD-L1 (22 C3): 0%  -12/21/2022 -05/30/2023: Abraxane  125mg /m2 with carboplatin  2 AUC added on with cycle 2 on 01/10/2023, discontinued due to progression -01/02/2023: [facility] NGS: MS-stable, TMB-low, PTEN pathogenic variant, genomic LOH high (18%), HER2 (1+), PD-L1 (22 C3) CPS-10.  -03/14/2023-05/16/2023:  Added Pembrolizumab  200mg  every 3 weeks -05/30/2023: PET: Substantially worsened burden of metastatic disease to the liver including new and enlarged lesions. Substantially worsened burden of metastatic disease to the skeleton including new and enlarged lesions. New and increased hypermetabolic nodularity along the right breast cutaneous surface, compatible with cutaneous metastatic disease. Mildly accentuated metabolic activity in a right hilar lymph node, maximum SUV 3.3 and formerly 2.8. -06/10/2023- current: Sacituzumab govitecan  started with 1 dose of 10mg /kg and currently on 7.5mg /kg every 3 weeks -08/29/2023: PET: Marked interval improvement in RIGHT breast carcinoma. Marked interval improvement in hepatic metastasis. Mixed response in skeletal metastasis. Several lesions are improved compared to prior where several lesions are new. Persistent widespread skeletal metastasis.  Current Treatment:  Sacituzumab govitecan  7.5mg /kg every 3 weeks  INTERVAL HISTORY:   Monica Soto is here today for follow up. She believes she has a UTI due to dysuria and urinary frequency. She denies any hematuria.  She notes a nighttime reflux cough and was prescribed Protonix  and Flonase  yesterday. Her cough improved after taking Protonix  last night.    She denies any active bleeding, nausea, vomiting, new lumps/bumps, or sores due to treatment. Tyese reports diarrhea that is well controlled with liquid Imodium. She denies any recurrent or frequent infections.   Darcia's right breast wound occasionally produces discharge and oozes. She treats the wound with bandages and neosporin. This is significantly improved compared to drainage and oozing 1 year ago.   She lives in [place] and is willing to go to [facility] for evaluation of clinical trials and second opinion.    I have reviewed the past medical history, past surgical history, social history and family history with the patient and they are unchanged from previous note.  ALLERGIES:  has no known allergies.  MEDICATIONS:  Current Outpatient Medications  Medication Sig Dispense Refill   Aspirin-Acetaminophen -Caffeine (GOODY HEADACHE PO) Take 1 packet by mouth 2 (two) times daily as needed (for pain).     Cholecalciferol 1.25 MG (50000 UT) capsule Take 1 capsule by mouth once a week.     diphenoxylate -atropine  (LOMOTIL ) 2.5-0.025 MG tablet Take 2 tablets after first loose stool and 1 after each loose stool thereafter.  Do not exceed 8 tablets in 1 day. 30 tablet 3   fluticasone  (FLONASE ) 50 MCG/ACT nasal spray Place 1 spray into both nostrils daily. 16 g 0   fluticasone  (FLONASE ) 50 MCG/ACT nasal spray Place 1 spray into both nostrils daily. 18.2 mL 2   furosemide  (LASIX ) 20 MG tablet TAKE 1 TABLET(20 MG) BY MOUTH DAILY AS NEEDED FOR SWELLING 30 tablet 2   HYDROcodone -acetaminophen  (NORCO/VICODIN) 5-325 MG tablet Take 1 tablet by mouth every 6 (six) hours as needed for moderate pain. 15 tablet 0   lidocaine -prilocaine  (EMLA ) cream APPLY TOPICALLY TO THE AFFECTED AREA 1 TIME 30 g 3   magnesium  oxide (MAG-OX) 400 (240 Mg) MG tablet Take 1 tablet (400 mg total) by mouth 2 (two) times daily. 60 tablet  3   ondansetron  (ZOFRAN ) 8 MG tablet TAKE 1 TABLET(8 MG) BY MOUTH EVERY 8 HOURS AS NEEDED FOR  NAUSEA OR VOMITING. START THIRD DAY AFTER DOXORUBICIN/ CYCLOPHOSPHAMIDE CHEMOTHERAPY 30 tablet 2   pantoprazole  (PROTONIX ) 40 MG tablet Take 1 tablet (40 mg total) by mouth daily. 30 tablet 2   prochlorperazine  (COMPAZINE ) 10 MG tablet Take 1 tablet (10 mg total) by mouth every 6 (six) hours as needed for nausea or vomiting. 30 tablet 2   No current facility-administered medications for this visit.   Facility-Administered Medications Ordered in Other Visits  Medication Dose Route Frequency Provider Last Rate Last Admin   0.9 %  sodium chloride  infusion   Intravenous Continuous Naila Elizondo, MD 10 mL/hr at 09/24/23 0925 New Bag at 09/24/23 0925    REVIEW OF SYSTEMS:   Constitutional: Positive for fatigue, Denies fevers, chills or abnormal weight loss Eyes: Denies blurriness of vision Ears, nose, mouth, throat, and face: Denies mucositis or sore throat Respiratory: Positive for cough, Denies dyspnea or wheezes Cardiovascular: Denies palpitation, chest discomfort or lower extremity swelling Gastrointestinal: Positive for diarrhea, Denies nausea, heartburn or change in bowel habits Skin: Denies abnormal skin rashes Lymphatics: Denies new lymphadenopathy or easy bruising Neurological: Positive for numbness in toes, Denies tingling or new weaknesses Behavioral/Psych: Mood is stable, no new changes  All other systems were reviewed with the patient and are negative.   VITALS:  Blood pressure 127/78, pulse (!) 122, temperature 98 F (36.7 C), temperature source Oral, resp. rate 18, weight 151 lb 12.8 oz (68.9 kg), SpO2 100%.  Wt Readings from Last 3 Encounters:  09/24/23 151 lb 12.8 oz (68.9 kg)  09/23/23 150 lb 9.6 oz (68.3 kg)  09/10/23 157 lb (71.2 kg)    Body mass index is 25.26 kg/m.  Performance status (ECOG): 1 - Symptomatic but completely ambulatory  PHYSICAL EXAM:   GENERAL:alert, no distress and comfortable SKIN: skin color, texture, turgor are normal, no rashes or  significant lesions BREAST: Right breast: Open breast wound with slight discharge, no signs of infection, no odor.  Nontender.  Left breast: Normal breast exam.  Bilateral axillary lymphadenopathy not palpated (picture below) LYMPH:  no palpable lymphadenopathy in the cervical, axillary or inguinal LUNGS: clear to auscultation and percussion with normal breathing effort HEART: regular rate & rhythm and no murmurs and no lower extremity edema ABDOMEN:abdomen soft, non-tender and normal bowel sounds Musculoskeletal:no cyanosis of digits and no clubbing  NEURO: alert & oriented x 3 with fluent speech, no focal motor/sensory deficits   LABORATORY DATA:  I have reviewed the data as listed    Component Value Date/Time   NA 134 (L) 09/23/2023 1003   K 3.5 09/23/2023 1003   CL 103 09/23/2023 1003   CO2 20 (L) 09/23/2023 1003   GLUCOSE 135 (H) 09/23/2023 1003   BUN 10 09/23/2023 1003   CREATININE 0.61 09/23/2023 1003   CREATININE 0.70 07/17/2022 0945   CALCIUM 8.4 (L) 09/23/2023 1003   PROT 6.7 09/23/2023 1003   ALBUMIN 3.1 (L) 09/23/2023 1003   AST 18 09/23/2023 1003   AST 11 (L) 07/17/2022 0945   ALT 13 09/23/2023 1003   ALT 14 07/17/2022 0945   ALKPHOS 183 (H) 09/23/2023 1003   BILITOT <0.2 09/23/2023 1003   BILITOT 0.3 07/17/2022 0945   GFRNONAA >60 09/23/2023 1003   GFRNONAA >60 07/17/2022 0945   GFRAA >60 03/29/2015 1616    Lab Results  Component Value Date   WBC 11.8 (H) 09/23/2023  NEUTROABS 9.8 (H) 09/23/2023   HGB 9.0 (L) 09/23/2023   HCT 29.3 (L) 09/23/2023   MCV 104.6 (H) 09/23/2023   PLT 167 09/23/2023    RADIOGRAPHIC STUDIES: I have personally reviewed the radiological images as listed and agreed with the findings in the report.  NM PET Image Restag (PS) Skull Base To Thigh CLINICAL DATA:  Subsequent treatment strategy for RIGHT breast carcinoma.  EXAM: NUCLEAR MEDICINE PET SKULL BASE TO THIGH  TECHNIQUE: 8.45 mCi F-18 FDG was injected intravenously.  Full-ring PET imaging was performed from the skull base to thigh after the radiotracer. CT data was obtained and used for attenuation correction and anatomic localization.  Fasting blood glucose: 101 mg/dl  COMPARISON:  FDG PET scan 05/30/2023  FINDINGS: NECK: No hypermetabolic lymph nodes in the neck.  Incidental CT findings: None.  CHEST: Decreased metabolic activity within the RIGHT breast. Previous large region intense metabolic activity along the skin surface is contracted too much smaller region with less radiotracer activity. Residual activity SUV max equal 3.8 on image 72 decreased from a large lesion with SUV max equal 9.0  No axillary adenopathy. No mediastinal adenopathy. No suspicious pulmonary nodules.  Incidental CT findings: Port in the anterior chest wall with tip in distal SVC.  ABDOMEN/PELVIS: Marked interval improvement in central hepatic metastasis. Large confluent lesion in the central LEFT and RIGHT hepatic lobe is reduced in volume. There is residual metabolic activity liver with SUV max equal 5.7 compared to a large lesion centrally with SUV max equal 9.1.  No new metabolic activity abdomen pelvis.  Metastatic adenopathy.  Incidental CT findings: None.  SKELETON: There is mixed response within the skeleton. Several lesions are improved compared to prior where several lesions are new compared to prior within the skeleton. For example in the RIGHT sacral ala intense radiotracer activity with SUV max equal 7.9 is essentially new from comparison exam. Lesions in the lumbar spine are increased from comparison exam. Lesion thoracic spine are improved from comparison exam. New lesion in the LEFT clavicle head for example with SUV max equal 6.5. Increased activity within the long bones of the humeri and femurs.  Incidental CT findings: None.  IMPRESSION: 1. Marked interval improvement in RIGHT breast carcinoma. 2. Marked interval improvement in  hepatic metastasis. 3. Mixed response in skeletal metastasis. Several lesions are improved compared to prior where several lesions are new. Persistent widespread skeletal metastasis.  Electronically Signed   By: Jackquline Boxer M.D.   On: 09/02/2023 11:58

## 2023-09-23 NOTE — Progress Notes (Signed)
 Patient's port flushed without difficulty.  Good blood return noted with no bruising or swelling noted at site. Labs drawn per orders. Pt expresses to RN that she has been having post nasal drainage and coughing a lot at night. She has tried multiple OTC medications with no relief. MD made aware, new prescriptions sent  to patient's pharmacy per MD. Pt updated and agrees to plan.  VSS with discharge and left in satisfactory condition with no s/s of distress noted. All follow ups as scheduled.       Monica Soto

## 2023-09-24 ENCOUNTER — Inpatient Hospital Stay (HOSPITAL_BASED_OUTPATIENT_CLINIC_OR_DEPARTMENT_OTHER): Admitting: Oncology

## 2023-09-24 ENCOUNTER — Encounter: Payer: Self-pay | Admitting: Oncology

## 2023-09-24 ENCOUNTER — Inpatient Hospital Stay

## 2023-09-24 VITALS — BP 100/65 | HR 87 | Temp 97.6°F

## 2023-09-24 VITALS — BP 127/78 | HR 122 | Temp 98.0°F | Resp 18 | Wt 151.8 lb

## 2023-09-24 DIAGNOSIS — S21009A Unspecified open wound of unspecified breast, initial encounter: Secondary | ICD-10-CM | POA: Insufficient documentation

## 2023-09-24 DIAGNOSIS — R059 Cough, unspecified: Secondary | ICD-10-CM | POA: Insufficient documentation

## 2023-09-24 DIAGNOSIS — R052 Subacute cough: Secondary | ICD-10-CM

## 2023-09-24 DIAGNOSIS — D508 Other iron deficiency anemias: Secondary | ICD-10-CM

## 2023-09-24 DIAGNOSIS — R300 Dysuria: Secondary | ICD-10-CM

## 2023-09-24 DIAGNOSIS — R197 Diarrhea, unspecified: Secondary | ICD-10-CM

## 2023-09-24 DIAGNOSIS — S21001S Unspecified open wound of right breast, sequela: Secondary | ICD-10-CM

## 2023-09-24 DIAGNOSIS — C50411 Malignant neoplasm of upper-outer quadrant of right female breast: Secondary | ICD-10-CM | POA: Diagnosis not present

## 2023-09-24 DIAGNOSIS — Z170 Estrogen receptor positive status [ER+]: Secondary | ICD-10-CM

## 2023-09-24 DIAGNOSIS — Z5112 Encounter for antineoplastic immunotherapy: Secondary | ICD-10-CM | POA: Diagnosis not present

## 2023-09-24 DIAGNOSIS — C7951 Secondary malignant neoplasm of bone: Secondary | ICD-10-CM | POA: Diagnosis not present

## 2023-09-24 LAB — URINALYSIS, ROUTINE W REFLEX MICROSCOPIC
Bilirubin Urine: NEGATIVE
Glucose, UA: NEGATIVE mg/dL
Hgb urine dipstick: NEGATIVE
Ketones, ur: NEGATIVE mg/dL
Leukocytes,Ua: NEGATIVE
Nitrite: NEGATIVE
Protein, ur: NEGATIVE mg/dL
Specific Gravity, Urine: 1.006 (ref 1.005–1.030)
pH: 6 (ref 5.0–8.0)

## 2023-09-24 LAB — IRON AND TIBC
Iron: 22 ug/dL — ABNORMAL LOW (ref 28–170)
Saturation Ratios: 8 % — ABNORMAL LOW (ref 10.4–31.8)
TIBC: 261 ug/dL (ref 250–450)
UIBC: 239 ug/dL

## 2023-09-24 LAB — FERRITIN: Ferritin: 427 ng/mL — ABNORMAL HIGH (ref 11–307)

## 2023-09-24 LAB — FOLATE: Folate: 12 ng/mL (ref >5.9)

## 2023-09-24 LAB — VITAMIN B12: Vitamin B-12: 1322 pg/mL — ABNORMAL HIGH (ref 180–914)

## 2023-09-24 MED ORDER — ACETAMINOPHEN 325 MG PO TABS
650.0000 mg | ORAL_TABLET | Freq: Once | ORAL | Status: AC
  Administered 2023-09-24: 650 mg via ORAL
  Filled 2023-09-24: qty 2

## 2023-09-24 MED ORDER — FAMOTIDINE IN NACL 20-0.9 MG/50ML-% IV SOLN
20.0000 mg | Freq: Once | INTRAVENOUS | Status: AC
  Administered 2023-09-24: 20 mg via INTRAVENOUS
  Filled 2023-09-24: qty 50

## 2023-09-24 MED ORDER — DEXAMETHASONE SODIUM PHOSPHATE 10 MG/ML IJ SOLN
10.0000 mg | Freq: Once | INTRAMUSCULAR | Status: AC
  Administered 2023-09-24: 10 mg via INTRAVENOUS
  Filled 2023-09-24: qty 1

## 2023-09-24 MED ORDER — SODIUM CHLORIDE 0.9 % IV SOLN
150.0000 mg | Freq: Once | INTRAVENOUS | Status: AC
Start: 2023-09-24 — End: 2023-09-24
  Administered 2023-09-24: 150 mg via INTRAVENOUS
  Filled 2023-09-24: qty 150

## 2023-09-24 MED ORDER — PALONOSETRON HCL INJECTION 0.25 MG/5ML
0.2500 mg | Freq: Once | INTRAVENOUS | Status: AC
  Administered 2023-09-24: 0.25 mg via INTRAVENOUS
  Filled 2023-09-24: qty 5

## 2023-09-24 MED ORDER — CETIRIZINE HCL 10 MG/ML IV SOLN
10.0000 mg | Freq: Once | INTRAVENOUS | Status: AC
  Administered 2023-09-24: 10 mg via INTRAVENOUS
  Filled 2023-09-24: qty 1

## 2023-09-24 MED ORDER — SODIUM CHLORIDE 0.9 % IV SOLN
7.5000 mg/kg | Freq: Once | INTRAVENOUS | Status: AC
  Administered 2023-09-24: 540 mg via INTRAVENOUS
  Filled 2023-09-24: qty 54

## 2023-09-24 MED ORDER — SODIUM CHLORIDE 0.9 % IV SOLN: INTRAVENOUS | Status: DC

## 2023-09-24 MED ORDER — ATROPINE SULFATE 1 MG/ML IV SOLN
0.5000 mg | Freq: Once | INTRAVENOUS | Status: AC
  Administered 2023-09-24: 0.5 mg via INTRAVENOUS
  Filled 2023-09-24: qty 1

## 2023-09-24 NOTE — Progress Notes (Signed)
 Patient has been examined by Dr. Davonna. Vital signs and labs have been reviewed by MD - ANC, Creatinine, LFTs, hemoglobin, and platelets have been reviewed by M.D. - pt may proceed with treatment.  Primary RN and pharmacy notified.

## 2023-09-24 NOTE — Progress Notes (Unsigned)
 Patient presents today for treatment (Trodelvy ) and follow up visit with Dr. Davonna. Heart rate on arrival elevated at 122. Recheck 105. Message received to proceed with treatment from A.Lenon RN / Dr. Davonna. Message received to collect urine for possible UTI per A.Lenon RN / Dr. Davonna.   Treatment given today per MD orders. Tolerated infusion without adverse affects. Vital signs stable. No complaints at this time. Discharged from clinic ambulatory in stable condition. Alert and oriented x 3. F/U with [facility] as scheduled.

## 2023-09-24 NOTE — Assessment & Plan Note (Addendum)
 Patient has multiple bony metastasis that are showing mixed response at this time Currently on Zometa  4 mg every 3 months  -Continue Zometa  4 mg IV every 3 months -Continue calcium and vitamin D supplementation

## 2023-09-24 NOTE — Patient Instructions (Signed)

## 2023-09-24 NOTE — Assessment & Plan Note (Signed)
 Patient reported cough when she lays down to sleep and reports a lot of postnasal discharge Started her on Flonase  and pantoprazole .  Patient reported improvement.  - Continue Flonase , cetirizine  and pantoprazole  as prescribed

## 2023-09-24 NOTE — Assessment & Plan Note (Addendum)
 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   Liver biopsy at this time showed low ER positive, PR negative and HER2 negative disease. Started on carboplatin  and Abraxane  and added pembrolizumab  and PD-L1 NGS came back as 10. Progressed on PET scan from 05/2023 and patient was started on Sacituzumab govitecan  on 06/10/2023 Recent PET with marked interval improvement in right breast carcinoma but also showed several new skeletal metastasis.  - The mixed response on PET scan was thought to be due to growth factor.  Continuing sacituzumab govitecan  for now proceed with cycle 6-day 1 today. - Labs reviewed today: Creatinine: Normal, alkaline phosphatase slightly elevated from prior.  Normal LFTs.  CBC: Consistent with anemia with a hemoglobin of 9, mild leukocytosis. -Recent CA 15-3, CA 20 7.29: Trending up. -Discussed with the patient that she is currently on third line of treatment and that her breast cancer is likely behaving like a triple negative breast cancer considering she had very low ER positivity on recent biopsy.  Poor prognosis discussed in detail and that with mixed response on recent PET scan would refer to [facility] for possible clinical trial evaluation and second opinion.  Return to clinic in 3 weeks with labs for follow-up and next cycle of chemotherapy

## 2023-09-24 NOTE — Assessment & Plan Note (Addendum)
 Patient has treatment induced diarrhea that is well-controlled with liquid Imodium  - Continue liquid Imodium as prescribed

## 2023-09-24 NOTE — Assessment & Plan Note (Signed)
 Patient has breast wound from the breast carcinoma No signs of infection at this time Patient keeping the area clean and applying dressing on it

## 2023-09-24 NOTE — Patient Instructions (Signed)
 [facility] [place] - A DEPT OF [facility]  Discharge Instructions: Thank you for choosing [facility] to provide your oncology and hematology care.  If you have a lab appointment with the Cancer Center - please note that after April 8th, 2024, all labs will be drawn in the cancer center.  You do not have to check in or register with the main entrance as you have in the past but will complete your check-in in the cancer center.  Wear comfortable clothing and clothing appropriate for easy access to any Portacath or PICC line.   We strive to give you quality time with your provider. You may need to reschedule your appointment if you arrive late (15 or more minutes).  Arriving late affects you and other patients whose appointments are after yours.  Also, if you miss three or more appointments without notifying the office, you may be dismissed from the clinic at the provider's discretion.      For prescription refill requests, have your pharmacy contact our office and allow 72 hours for refills to be completed.    Today you received the following chemotherapy and/or immunotherapy agents Trodelvy .   Sacituzumab Govitecan  Injection What is this medication? SACITUZUMAB GOVITECAN  (SAK i TOOZ ue mab GOE vi TEE can) treats breast cancer. It works by blocking a protein that causes cancer cells to grow and multiply. This helps to slow or stop the spread of cancer cells. This medicine may be used for other purposes; ask your health care provider or pharmacist if you have questions. COMMON BRAND NAME(S): TRODELVY  What should I tell my care team before I take this medication? They need to know if you have any of these conditions: Carry the UGT1A1*28 gene Infection Liver disease An unusual or allergic reaction to sacituzumab govitecan , other medications, foods, dyes, or preservatives Pregnant or trying to get pregnant Breast-feeding How should I use this  medication? This medication is injected into a vein. It is given by your care team in a hospital or clinic setting. Talk to your care team about the use of this medication in children. Special care may be needed. Overdosage: If you think you have taken too much of this medicine contact a poison control center or emergency room at once. NOTE: This medicine is only for you. Do not share this medicine with others. What if I miss a dose? Keep appointments for follow-up doses. It is important not to miss your dose. Call your care team if you are unable to keep an appointment. What may interact with this medication? This medication may affect how other medications work, and other medications may affect the way this medication works. Talk with your care team about all of the medications you take. They may suggest changes to your treatment plan to lower the risk of side effects and to make sure your medications work as intended. This list may not describe all possible interactions. Give your health care provider a list of all the medicines, herbs, non-prescription drugs, or dietary supplements you use. Also tell them if you smoke, drink alcohol, or use illegal drugs. Some items may interact with your medicine. What should I watch for while using this medication? This medication may make you feel generally unwell. This is not uncommon as chemotherapy can affect healthy cells as well as cancer cells. Report any side effects. Continue your course of treatment even though you feel ill unless your care team tells you to stop.  You may need blood work while you are taking this medication. Certain genetic factors may decrease the safety of this medication. Your care team may use genetic tests to determine treatment. This medication can cause serious allergic reactions. To reduce your risk, your care team may give you other medications to take before receiving this one. Be sure to follow the directions from your care  team. Check with your care team if you have severe diarrhea, nausea, and vomiting, or if you sweat a lot. The loss of too much body fluid may make it dangerous for you to take this medication. Talk to your care team if you wish to become pregnant or think you might be pregnant. This medication can cause serious birth defects if taken during pregnancy or if you get pregnant within 6 months after stopping treatment. A negative pregnancy test is required before starting this medication. A reliable form of contraception is recommended while taking this medication and for 6 months after stopping treatment. Talk to your care team about reliable forms of contraception. Use a condom during sex and for 3 months after stopping treatment. Tell your care team right away if you think your partner might be pregnant. This medication can cause serious birth defects. Do not breast-feed while taking this medication and for 1 month after stopping therapy. This medication may cause infertility. Talk to your care team if you are concerned about your fertility. This medication may increase your risk of getting an infection. Call your care team for advice if you get a fever, chills, sore throat, or other symptoms of a cold or flu. Do not treat yourself. Try to avoid being around people who are sick. Avoid taking medications that contain aspirin, acetaminophen , ibuprofen , naproxen , or ketoprofen unless instructed by your care team. These medications may hide a fever. This medication may increase blood sugar. The risk may be higher in patients who already have diabetes. Ask your care team what you can do to lower your risk of diabetes while taking this medication. What side effects may I notice from receiving this medication? Side effects that you should report to your care team as soon as possible: Allergic reactions--skin rash, itching, hives, swelling of the face, lips, tongue, or throat Infection--fever, chills, cough, or  sore throat Infusion reactions--chest pain, shortness of breath or trouble breathing, feeling faint or lightheaded Low red blood cell level--unusual weakness or fatigue, dizziness, headache, trouble breathing Severe or prolonged diarrhea Side effects that usually do not require medical attention (report these to your care team if they continue or are bothersome): Constipation Diarrhea Fatigue Hair loss Loss of appetite Nausea Vomiting This list may not describe all possible side effects. Call your doctor for medical advice about side effects. You may report side effects to FDA at 1-800-FDA-1088. Where should I keep my medication? This medication is given in a hospital or clinic. It will not be stored at home. NOTE: This sheet is a summary. It may not cover all possible information. If you have questions about this medicine, talk to your doctor, pharmacist, or health care provider.  2025 Elsevier/Gold Standard (2023-01-07 00:00:00)   To help prevent nausea and vomiting after your treatment, we encourage you to take your nausea medication as directed.  BELOW ARE SYMPTOMS THAT SHOULD BE REPORTED IMMEDIATELY: *FEVER GREATER THAN 100.4 F (38 C) OR HIGHER *CHILLS OR SWEATING *NAUSEA AND VOMITING THAT IS NOT CONTROLLED WITH YOUR NAUSEA MEDICATION *UNUSUAL SHORTNESS OF BREATH *UNUSUAL BRUISING OR BLEEDING *URINARY PROBLEMS (pain or  burning when urinating, or frequent urination) *BOWEL PROBLEMS (unusual diarrhea, constipation, pain near the anus) TENDERNESS IN MOUTH AND THROAT WITH OR WITHOUT PRESENCE OF ULCERS (sore throat, sores in mouth, or a toothache) UNUSUAL RASH, SWELLING OR PAIN  UNUSUAL VAGINAL DISCHARGE OR ITCHING   Items with * indicate a potential emergency and should be followed up as soon as possible or go to the Emergency Department if any problems should occur.  Please show the CHEMOTHERAPY ALERT CARD or IMMUNOTHERAPY ALERT CARD at check-in to the Emergency Department and  triage nurse.  Should you have questions after your visit or need to cancel or reschedule your appointment, please contact [facility] [place] - A DEPT OF [facility] [phone]  and follow the prompts.  Office hours are 8:00 a.m. to 4:30 p.m. Monday - Friday. Please note that voicemails left after 4:00 p.m. may not be returned until the following business day.  We are closed weekends and major holidays. You have access to a nurse at all times for urgent questions. Please call the main number to the clinic [phone] and follow the prompts.  For any non-urgent questions, you may also contact your provider using MyChart. We now offer e-Visits for anyone 79 and older to request care online for non-urgent symptoms. For details visit [email or web address].   Also download the MyChart app! Go to the app store, search "MyChart", open the app, select [facility], and log in with your MyChart username and password.

## 2023-09-25 ENCOUNTER — Other Ambulatory Visit: Payer: Self-pay

## 2023-09-25 LAB — URINE CULTURE: Culture: NO GROWTH

## 2023-10-01 ENCOUNTER — Inpatient Hospital Stay

## 2023-10-01 VITALS — BP 115/68 | HR 73 | Temp 97.8°F | Resp 18

## 2023-10-01 DIAGNOSIS — Z170 Estrogen receptor positive status [ER+]: Secondary | ICD-10-CM

## 2023-10-01 DIAGNOSIS — D702 Other drug-induced agranulocytosis: Secondary | ICD-10-CM

## 2023-10-01 DIAGNOSIS — Z5112 Encounter for antineoplastic immunotherapy: Secondary | ICD-10-CM | POA: Diagnosis not present

## 2023-10-01 LAB — CBC WITH DIFFERENTIAL/PLATELET
Abs Immature Granulocytes: 0.03 K/uL (ref 0.00–0.07)
Basophils Absolute: 0 K/uL (ref 0.0–0.1)
Basophils Relative: 1 %
Eosinophils Absolute: 0.1 K/uL (ref 0.0–0.5)
Eosinophils Relative: 2 %
HCT: 29 % — ABNORMAL LOW (ref 36.0–46.0)
Hemoglobin: 9.2 g/dL — ABNORMAL LOW (ref 12.0–15.0)
Immature Granulocytes: 1 %
Lymphocytes Relative: 28 %
Lymphs Abs: 1.1 K/uL (ref 0.7–4.0)
MCH: 32.6 pg (ref 26.0–34.0)
MCHC: 31.7 g/dL (ref 30.0–36.0)
MCV: 102.8 fL — ABNORMAL HIGH (ref 80.0–100.0)
Monocytes Absolute: 0.4 K/uL (ref 0.1–1.0)
Monocytes Relative: 10 %
Neutro Abs: 2.4 K/uL (ref 1.7–7.7)
Neutrophils Relative %: 58 %
Platelets: 254 K/uL (ref 150–400)
RBC: 2.82 MIL/uL — ABNORMAL LOW (ref 3.87–5.11)
RDW: 16.9 % — ABNORMAL HIGH (ref 11.5–15.5)
WBC: 4 K/uL (ref 4.0–10.5)
nRBC: 0.5 % — ABNORMAL HIGH (ref 0.0–0.2)

## 2023-10-01 LAB — COMPREHENSIVE METABOLIC PANEL WITH GFR
ALT: 13 U/L (ref 0–44)
AST: 15 U/L (ref 15–41)
Albumin: 3.2 g/dL — ABNORMAL LOW (ref 3.5–5.0)
Alkaline Phosphatase: 150 U/L — ABNORMAL HIGH (ref 38–126)
Anion gap: 13 (ref 5–15)
BUN: 8 mg/dL (ref 6–20)
CO2: 21 mmol/L — ABNORMAL LOW (ref 22–32)
Calcium: 8.6 mg/dL — ABNORMAL LOW (ref 8.9–10.3)
Chloride: 106 mmol/L (ref 98–111)
Creatinine, Ser: 0.57 mg/dL (ref 0.44–1.00)
GFR, Estimated: >60 mL/min (ref >60)
Glucose, Bld: 103 mg/dL — ABNORMAL HIGH (ref 70–99)
Potassium: 3.7 mmol/L (ref 3.5–5.1)
Sodium: 140 mmol/L (ref 135–145)
Total Bilirubin: 0.4 mg/dL (ref 0.0–1.2)
Total Protein: 6.8 g/dL (ref 6.5–8.1)

## 2023-10-01 LAB — PHOSPHORUS: Phosphorus: 3.4 mg/dL (ref 2.5–4.6)

## 2023-10-01 LAB — MAGNESIUM: Magnesium: 1.7 mg/dL (ref 1.7–2.4)

## 2023-10-01 MED ORDER — PALONOSETRON HCL INJECTION 0.25 MG/5ML
0.2500 mg | Freq: Once | INTRAVENOUS | Status: AC
  Administered 2023-10-01: 0.25 mg via INTRAVENOUS
  Filled 2023-10-01: qty 5

## 2023-10-01 MED ORDER — SODIUM CHLORIDE 0.9 % IV SOLN
7.5000 mg/kg | Freq: Once | INTRAVENOUS | Status: AC
  Administered 2023-10-01: 540 mg via INTRAVENOUS
  Filled 2023-10-01: qty 54

## 2023-10-01 MED ORDER — SODIUM CHLORIDE 0.9 % IV SOLN
150.0000 mg | Freq: Once | INTRAVENOUS | Status: AC
  Administered 2023-10-01: 150 mg via INTRAVENOUS
  Filled 2023-10-01: qty 150

## 2023-10-01 MED ORDER — ATROPINE SULFATE 1 MG/ML IV SOLN
0.5000 mg | Freq: Once | INTRAVENOUS | Status: AC
  Administered 2023-10-01: 0.5 mg via INTRAVENOUS
  Filled 2023-10-01: qty 1

## 2023-10-01 MED ORDER — DEXAMETHASONE SODIUM PHOSPHATE 10 MG/ML IJ SOLN
10.0000 mg | Freq: Once | INTRAMUSCULAR | Status: AC
  Administered 2023-10-01: 10 mg via INTRAVENOUS
  Filled 2023-10-01: qty 1

## 2023-10-01 MED ORDER — SODIUM CHLORIDE 0.9 % IV SOLN: INTRAVENOUS | Status: DC

## 2023-10-01 MED ORDER — CETIRIZINE HCL 10 MG/ML IV SOLN
10.0000 mg | Freq: Once | INTRAVENOUS | Status: AC
  Administered 2023-10-01: 10 mg via INTRAVENOUS
  Filled 2023-10-01: qty 1

## 2023-10-01 MED ORDER — FAMOTIDINE IN NACL 20-0.9 MG/50ML-% IV SOLN
20.0000 mg | Freq: Once | INTRAVENOUS | Status: AC
  Administered 2023-10-01: 20 mg via INTRAVENOUS
  Filled 2023-10-01: qty 50

## 2023-10-01 MED ORDER — ACETAMINOPHEN 325 MG PO TABS
650.0000 mg | ORAL_TABLET | Freq: Once | ORAL | Status: AC
  Administered 2023-10-01: 650 mg via ORAL
  Filled 2023-10-01: qty 2

## 2023-10-01 NOTE — Progress Notes (Signed)
 Patient presents today for Trodelvy  infusion.  Patient is in satisfactory condition with no new complaints voiced.  Vital signs are stable.  Labs reviewed and all labs are within treatment parameters.  We will proceed with treatment per MD orders.    Treatment given today per MD orders. Tolerated infusion without adverse affects. Vital signs stable. No complaints at this time. Discharged from clinic ambulatory in stable condition. Alert and oriented x 3. F/U with [facility] as scheduled.

## 2023-10-01 NOTE — Patient Instructions (Signed)
 [facility] [place] - A DEPT OF [facility]  Discharge Instructions: Thank you for choosing [facility] to provide your oncology and hematology care.  If you have a lab appointment with the Cancer Center - please note that after April 8th, 2024, all labs will be drawn in the cancer center.  You do not have to check in or register with the main entrance as you have in the past but will complete your check-in in the cancer center.  Wear comfortable clothing and clothing appropriate for easy access to any Portacath or PICC line.   We strive to give you quality time with your provider. You may need to reschedule your appointment if you arrive late (15 or more minutes).  Arriving late affects you and other patients whose appointments are after yours.  Also, if you miss three or more appointments without notifying the office, you may be dismissed from the clinic at the provider's discretion.      For prescription refill requests, have your pharmacy contact our office and allow 72 hours for refills to be completed.    Today you received the following chemotherapy and/or immunotherapy agents Trodelvy    To help prevent nausea and vomiting after your treatment, we encourage you to take your nausea medication as directed.   Sacituzumab Govitecan  Injection What is this medication? SACITUZUMAB GOVITECAN  (SAK i TOOZ ue mab GOE vi TEE can) treats breast cancer. It works by blocking a protein that causes cancer cells to grow and multiply. This helps to slow or stop the spread of cancer cells. This medicine may be used for other purposes; ask your health care provider or pharmacist if you have questions. COMMON BRAND NAME(S): TRODELVY  What should I tell my care team before I take this medication? They need to know if you have any of these conditions: Carry the UGT1A1*28 gene Infection Liver disease An unusual or allergic reaction to sacituzumab govitecan , other  medications, foods, dyes, or preservatives Pregnant or trying to get pregnant Breast-feeding How should I use this medication? This medication is injected into a vein. It is given by your care team in a hospital or clinic setting. Talk to your care team about the use of this medication in children. Special care may be needed. Overdosage: If you think you have taken too much of this medicine contact a poison control center or emergency room at once. NOTE: This medicine is only for you. Do not share this medicine with others. What if I miss a dose? Keep appointments for follow-up doses. It is important not to miss your dose. Call your care team if you are unable to keep an appointment. What may interact with this medication? This medication may affect how other medications work, and other medications may affect the way this medication works. Talk with your care team about all of the medications you take. They may suggest changes to your treatment plan to lower the risk of side effects and to make sure your medications work as intended. This list may not describe all possible interactions. Give your health care provider a list of all the medicines, herbs, non-prescription drugs, or dietary supplements you use. Also tell them if you smoke, drink alcohol, or use illegal drugs. Some items may interact with your medicine. What should I watch for while using this medication? This medication may make you feel generally unwell. This is not uncommon as chemotherapy can affect healthy cells as well as cancer cells. Report  any side effects. Continue your course of treatment even though you feel ill unless your care team tells you to stop. You may need blood work while you are taking this medication. Certain genetic factors may decrease the safety of this medication. Your care team may use genetic tests to determine treatment. This medication can cause serious allergic reactions. To reduce your risk, your care  team may give you other medications to take before receiving this one. Be sure to follow the directions from your care team. Check with your care team if you have severe diarrhea, nausea, and vomiting, or if you sweat a lot. The loss of too much body fluid may make it dangerous for you to take this medication. Talk to your care team if you wish to become pregnant or think you might be pregnant. This medication can cause serious birth defects if taken during pregnancy or if you get pregnant within 6 months after stopping treatment. A negative pregnancy test is required before starting this medication. A reliable form of contraception is recommended while taking this medication and for 6 months after stopping treatment. Talk to your care team about reliable forms of contraception. Use a condom during sex and for 3 months after stopping treatment. Tell your care team right away if you think your partner might be pregnant. This medication can cause serious birth defects. Do not breast-feed while taking this medication and for 1 month after stopping therapy. This medication may cause infertility. Talk to your care team if you are concerned about your fertility. This medication may increase your risk of getting an infection. Call your care team for advice if you get a fever, chills, sore throat, or other symptoms of a cold or flu. Do not treat yourself. Try to avoid being around people who are sick. Avoid taking medications that contain aspirin, acetaminophen , ibuprofen, naproxen, or ketoprofen unless instructed by your care team. These medications may hide a fever. This medication may increase blood sugar. The risk may be higher in patients who already have diabetes. Ask your care team what you can do to lower your risk of diabetes while taking this medication. What side effects may I notice from receiving this medication? Side effects that you should report to your care team as soon as possible: Allergic  reactions--skin rash, itching, hives, swelling of the face, lips, tongue, or throat Infection--fever, chills, cough, or sore throat Infusion reactions--chest pain, shortness of breath or trouble breathing, feeling faint or lightheaded Low red blood cell level--unusual weakness or fatigue, dizziness, headache, trouble breathing Severe or prolonged diarrhea Side effects that usually do not require medical attention (report these to your care team if they continue or are bothersome): Constipation Diarrhea Fatigue Hair loss Loss of appetite Nausea Vomiting This list may not describe all possible side effects. Call your doctor for medical advice about side effects. You may report side effects to FDA at 1-800-FDA-1088. Where should I keep my medication? This medication is given in a hospital or clinic. It will not be stored at home. NOTE: This sheet is a summary. It may not cover all possible information. If you have questions about this medicine, talk to your doctor, pharmacist, or health care provider.  2025 Elsevier/Gold Standard (2023-01-07 00:00:00)  BELOW ARE SYMPTOMS THAT SHOULD BE REPORTED IMMEDIATELY: *FEVER GREATER THAN 100.4 F (38 C) OR HIGHER *CHILLS OR SWEATING *NAUSEA AND VOMITING THAT IS NOT CONTROLLED WITH YOUR NAUSEA MEDICATION *UNUSUAL SHORTNESS OF BREATH *UNUSUAL BRUISING OR BLEEDING *URINARY PROBLEMS (pain or  burning when urinating, or frequent urination) *BOWEL PROBLEMS (unusual diarrhea, constipation, pain near the anus) TENDERNESS IN MOUTH AND THROAT WITH OR WITHOUT PRESENCE OF ULCERS (sore throat, sores in mouth, or a toothache) UNUSUAL RASH, SWELLING OR PAIN  UNUSUAL VAGINAL DISCHARGE OR ITCHING   Items with * indicate a potential emergency and should be followed up as soon as possible or go to the Emergency Department if any problems should occur.  Please show the CHEMOTHERAPY ALERT CARD or IMMUNOTHERAPY ALERT CARD at check-in to the Emergency Department and  triage nurse.  Should you have questions after your visit or need to cancel or reschedule your appointment, please contact [facility] [place] - A DEPT OF [facility] [phone]  and follow the prompts.  Office hours are 8:00 a.m. to 4:30 p.m. Monday - Friday. Please note that voicemails left after 4:00 p.m. may not be returned until the following business day.  We are closed weekends and major holidays. You have access to a nurse at all times for urgent questions. Please call the main number to the clinic [phone] and follow the prompts.  For any non-urgent questions, you may also contact your provider using MyChart. We now offer e-Visits for anyone 14 and older to request care online for non-urgent symptoms. For details visit [email or web address].   Also download the MyChart app! Go to the app store, search MyChart, open the app, select [place], and log in with your MyChart username and password.

## 2023-10-02 ENCOUNTER — Inpatient Hospital Stay

## 2023-10-02 VITALS — BP 142/77 | HR 71 | Temp 98.5°F | Resp 18

## 2023-10-02 DIAGNOSIS — Z5112 Encounter for antineoplastic immunotherapy: Secondary | ICD-10-CM | POA: Diagnosis not present

## 2023-10-02 DIAGNOSIS — Z170 Estrogen receptor positive status [ER+]: Secondary | ICD-10-CM

## 2023-10-02 LAB — CANCER ANTIGEN 15-3: CA 15-3: 254 U/mL — ABNORMAL HIGH (ref 0.0–25.0)

## 2023-10-02 LAB — CANCER ANTIGEN 27.29: CA 27.29: 383.7 U/mL — ABNORMAL HIGH (ref 0.0–38.6)

## 2023-10-02 MED ORDER — PEGFILGRASTIM-CBQV 6 MG/0.6ML ~~LOC~~ SOSY
6.0000 mg | PREFILLED_SYRINGE | Freq: Once | SUBCUTANEOUS | Status: AC
  Administered 2023-10-02: 6 mg via SUBCUTANEOUS
  Filled 2023-10-02: qty 0.6

## 2023-10-02 NOTE — Patient Instructions (Signed)

## 2023-10-02 NOTE — Progress Notes (Signed)
 Patient tolerated Udenyca injection with no complaints voiced.  Site clean and dry with no bruising or swelling noted at site.  See MAR for details.  Band aid applied.  Patient stable during and after injection.  Vss with discharge and left in satisfactory condition with no s/s of distress noted. All follow ups as scheduled.   Monica Soto [employer]

## 2023-10-03 ENCOUNTER — Other Ambulatory Visit: Payer: Self-pay | Admitting: *Deleted

## 2023-10-04 NOTE — Progress Notes (Signed)
 Breast Clinic New Consultation Evaluation Medical Oncology     Identifying Statement: Monica Soto [id number] is a 58 y.o. female from [facility] [place] [address] seen for a second opinion regarding recent PET/CT findings and management of her metastatic breast cancer  .  Physician Requesting Consultation: [name], MD Patient Care Team: [name], MD as PCP - General (Family Medicine) [name], MD as Referring Physician (Oncology) [name], MD (Radiation Oncology) Trinna Burr RAMAN, RN as Nurse Navigator Dominick, Lorene Glenn, MD as Consulting Provider (Oncology) Linker, Katelyn Suzanne, NP as Nurse Practitioner (Medical Oncology)  History of Present Illness: 58 y.o. 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 She initiated on anastrozole  and ribociclib . In 11/2022, staging scans revealed mixed response with overall progression of disease with new bone, liver, soft tissue, breast, and LN lesions. Liver biopsy at that time revealed metastatic adenocarcinoma consistent with breast primary, now ER 2%, PR 0%, HER2 1+ negative. PDL1 testing on CARIS was 10. She transitioned to carboplatin /Abraxane  + pembrolizumab  at that time. After progression in 06/2023, she switched to sacituzumab. PET scan 08/2023 with improvement in breast and liver metastases, mixed response in the bones. She follows with [facility]. Within this practice, she recently switched oncologists to Dr. Davonna (Dr. MARLA, her previous oncologist, moved away). She has been referred to [facility] by her local oncologist for a second opinion regarding her therapy options.  She reports that she is currently feeling pretty well. She has been tolerating her sacituzumab govitecan  okay. She notes significant fatigue and altered taste in the days directly following her infusions. She also notes some worsened diarrhea during the second week of her  three week cycles, which she attributes to her filgrastim  injections. These symptoms improve by the third week of her 21 day cycles, and she feels close to her baseline during this last week. However, she has noticed an improvement in her overall fatigue and abdominal pain since she has started the sacituzumab.   She lives in [place], [place], which is about 70 minutes away. 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   She lives with her husband - they have two adult children, 63 year old daughter and 29 year old won. Their daughter lives with them, and her son lives very close. She is a current longtime smoker- she has cut down since starting chemotherapy, smoking half a pack per day currently.  History of Present Illness     Past Medical History:  Diagnosis Date  . GERD (gastroesophageal reflux disease)   . History of cancer   . IBS (irritable bowel syndrome)     Past Surgical History:  Procedure Laterality Date  . APPENDECTOMY    . HYSTERECTOMY    . LAPAROSCOPIC TUBAL LIGATION    . TONSILLECTOMY      Gynecologic History:   Current Outpatient Medications  Medication Sig Dispense Refill  . aspirin-acetaminophen -caffeine (EXCEDRIN MIGRAINE) 250-250-65 mg per tablet Take by mouth    . cetirizine  (ZYRTEC ) 10 MG tablet Take 1 tablet by mouth once daily    . diphenhydrAMINE (BENADRYL) 25 mg capsule Take 25 mg by mouth every 6 (six) hours as needed    . fluticasone  propionate (FLONASE ) 50 mcg/actuation nasal spray Place 2 sprays into one nostril once daily    . magnesium  oxide (MAG-OX) 400 mg (241.3 mg magnesium ) tablet Take 400  mg by mouth once daily    . naproxen  (NAPROSYN ) 500 MG tablet Take 500 mg by mouth 2 (two) times daily with meals    . ondansetron  (ZOFRAN -ODT) 4 MG disintegrating tablet Take 4 mg by mouth every 8 (eight) hours as needed    . benzonatate  (TESSALON ) 100 MG capsule Take by mouth (Patient not taking:  Reported on 10/09/2023)    . cyclobenzaprine  (FLEXERIL ) 10 MG tablet Take 10 mg by mouth 3 (three) times daily as needed (Patient not taking: Reported on 10/09/2023)    . promethazine -dextromethorphan (PROMETHAZINE -DM) 6.25-15 mg/5 mL syrup TAKE 5 ML BY MOUTH EVERY 6 HOURS FOR 4 DAYS AS NEEDED FOR COUGH (Patient not taking: Reported on 10/09/2023)     No current facility-administered medications for this visit.    Not on File  Psycho-Social History: Social History   Socioeconomic History  . Marital status: Married  Tobacco Use  . Smoking status: Every Day    Current packs/day: 0.50    Types: Cigarettes  . Smokeless tobacco: Never  Vaping Use  . Vaping status: Never Used  Substance and Sexual Activity  . Alcohol use: Never  . Drug use: Never   Social Drivers of Health   Food Insecurity: No Food Insecurity (07/04/2022)   Received from [facility]   Hunger Vital Sign   . Within the past 12 months, you worried that your food would run out before you got the money to buy more.: Never true   . Within the past 12 months, the food you bought just didn't last and you didn't have money to get more.: Never true  Transportation Needs: No Transportation Needs (07/04/2022)   Received from [facility] - Transportation   . Lack of Transportation (Medical): No   . Lack of Transportation (Non-Medical): No  Housing Stability: Unknown (10/06/2023)   Housing Stability Vital Sign   . Homeless in the Last Year: No    @nccnreview @  Review of Systems: Negative except for what is mentioned in HPI  Objective:    Physical Exam: BP 127/81 (BP Location: Right upper arm, Patient Position: Sitting, BP Cuff Size: Adult)   Pulse 95   Temp 36.7 C (98.1 F) (Oral)   Resp 18   Ht 164 cm (5' 4.57)   Wt 70.3 kg (154 lb 15.7 oz)   SpO2 99%   BMI 26.14 kg/m Body surface area is 1.79 meters squared. ECOG Performance Status:  1  General:  Well developed, well appearing, in no acute distress.    HEENT: Pupils equal reactive to light and accomodation.  Extra ocular movement intact, sclera anicteric, oropharynx clear, no lesion, neck supple with midline trachea, thyroid without masses and trachea midline. No jugular venous distension. No carotid bruits   Chest/Lung:  Clear to auscultation, no respiratory distress.   Breasts:        Right: deferred       Left:   deferred   Cardiovasclar:  Regular rate and rhythm, S1, S2 normal, no murmur, rub or gallop.   Abdomen:  Soft, non-tender; bowel sounds normal; no masses, no organomegaly.   Extremities:  Nonpitting edema noted in upper extremities (patient reports having hx of lymphedema in upper extremities), no BLE edema.    Skin: Color and texture normal.  No rashes or lesions.  Nails without clubbing.   Pulses:  Posterior tibial, dorsalis pedal pulses are 2+ symmetric   Neurologic:  Alert and oriented X3, normal strength and tone.   Lymph  node: Cervical, supraclavicular, and axillary nodes normal.    Laboratory:    Chemistry   No results found for: NA, K, CL, CO2, BUN, CREATININE, GLUCOSE No results found for: CALCIUM, ALKPHOS, AST, ALT, TBILI    Last CBC and neutrophil count: No results found for: HGBNo results found for: HCTNo results found for: PLTNo results found for: [facility] results found for: NEUTOPHILPCTNo results found for: NEUTCT   Results     ** No results found for the last 2160 hours. **       Imaging: PET 08/29/23 (outside interpretation:  FINDINGS: Visualized Head & Neck: - Lymph Nodes: No cervical lymphadenopathy or suspicious tracer uptake. - Thyroid: No focal tracer uptake. - Other: Right chest wall port with tip terminating in the superior cavoatrial junction.   Chest: - Lymph Nodes: No axillary, mediastinal, or hilar lymphadenopathy. No suspicious tracer uptake.  - Heart & Pericardium: No suspicious tracer uptake.  - Lungs & Pleura: No suspicious pulmonary nodules. No  pleural effusions.    Abdomen & Pelvis: - Lymph Nodes: No intra-abdominal, pelvic, or inguinal  lymphadenopathy. No suspicious tracer uptake.  - Liver:  Overall decreased size of the confluent hypoattenuating lesion predominantly within the left hepatic lobe with interval decreased tracer uptake within the more peripheral portions of the lesion. Nodular atrophy of the left hepatic lobe, likely due to treated metastatic disease. - Biliary & Gallbladder: No suspicious tracer uptake. - Spleen: Unremarkable.  - Pancreas: No suspicious tracer uptake.  - Adrenal Glands: No suspicious tracer uptake.  - Kidneys: Normal urinary excretion without hydronephrosis. - Genitourinary: No suspicious tracer uptake.  - Vasculature: No suspicious tracer uptake.  - Gastrointestinal: Physiologic uptake without suspicious focal uptake.  - Peritoneum, Mesentery, & Retroperitoneum: No suspicious tracer uptake.    Musculoskeletal: - Bones:  Diffuse heterogenous sclerosis of the axial and appendicular skeleton with areas of heterogenous uptake, some areas decreased from prior and other areas increased from prior. For example, there is decreased uptake throughout the thoracic spine and increased uptake throughout the pelvis and bilateral humeri.  - Soft Tissues: Overall decreased soft tissue stranding in the bilateral breast soft tissues. Postsurgical changes in the right breast with decreased soft tissue thickening overlying the right breast lesion. Overall decreased tracer uptake in the right breast. Focal soft tissue swelling about the right proximal forearm.     IMPRESSION: 1.  Diffuse heterogenous sclerosis of the axial and appendicular skeleton with areas of heterogenous metabolic activity. Compared to 05/30/2023 PET/CT, there are areas which are intervally decreased and other areas which are increased in FDG uptake, although there is no change in CT appearance. It is difficult to differentiate  posttreatment changes (i.e.  reactive marrow uptake or treated/ inactive metastatic disease) from active metastatic disease and attention on followup is suggested.  2.  Marked interval improvement in right breast primary malignancy. 3.  Marked interval improvement in hepatic metastasis.    Impression/Plan:      Cancer Staging <redacted file path>  No matching staging information was found for the patient.   Oncology History  Malignant neoplasm of upper-outer quadrant of right breast in female, estrogen receptor positive (CMS/HHS-HCC)  06/22/2022 Biopsy       07/03/2022 Initial Diagnosis   Malignant neoplasm of upper-outer quadrant of right breast in female, estrogen receptor positive (CMS/HHS-HCC)   07/2022 -  Endocrine Therapy   Anastrozole /ribociclib    07/2022 Tumor Sequencing   [facility]ae genetics negative   07/12/2022 Radiology Study   PET: IMPRESSION:  1.  Intensely hypermetabolic RIGHT breast mass consistent with  primary breast carcinoma.  2. Hypermetabolic metastatic RIGHT axillary lymph nodes.  3. Hypermetabolic metastatic lymph node in the posterior triangle of  the RIGHT neck.  4. Hypermetabolic LEFT axillary lymph nodes.  5. No evidence of metastatic adenopathy in the mediastinum or  abdomen pelvis.  6. No evidence of visceral metastasis.  7. Multiple intensely hypermetabolic skeletal metastasis.    11/15/2022 Radiology Study   PET: IMPRESSION:  1. Mixed response to therapy with overall progression of disease.  2. New hypermetabolic skeletal metastasis.  3. New hypermetabolic liver metastasis.  4. New hypermetabolic periportal lymph nodes.  5. New hypermetabolic cutaneous tissue in the RIGHT breast.  6. New hypermetabolic glandular tissue in the LEFT breast.  7. Persistent hypermetabolic tissue in the RIGHT breast with  extension to the skin surface.    11/2022 Tumor Sequencing   Guardant    12/2022 -  Chemotherapy   Carboplatin /Abraxane  + pembrolizumab   when PDL1 results returned (CARIS results at 10)   12/07/2022 Biopsy   Liver biopsy:          06/10/2023 -  Chemotherapy   Sacituzumab   08/29/2023 Radiology Study   PET:  IMPRESSION:  1. Marked interval improvement in RIGHT breast carcinoma.  2. Marked interval improvement in hepatic metastasis.  3. Mixed response in skeletal metastasis. Several lesions are  improved compared to prior where several lesions are new. Persistent  widespread skeletal metastasis.   Referred to [facility] to discuss mixed response, clinical trials, 2nd opinion     58 y.o. female with hx of metastatic HR+Her2- breast cancer initially dx in 06/2022 and treated with AI and CDK4/6i, with widespread progression 11/2022 with liver biopsy showing triple negative status and PDL1 10%. She was treated with carboplatin /abraxane  and pembrolizumab , until she had progression 06/2023, after which she was switched to sacituzumab. Her most recent PET/CT 08/29/2023 shows response in breast lesion and liver metastases, with mixed response in her osseous metastatic disease.   We are reassured that her PET/CT shows improvement in her visceral disease. The mixed response/sclerotic lesions in her osseous metastases are difficult to interpret in the setting of bone turnover and her breast cancer treatment, as this could be caused by treatment effect. Either way, we would continue her current course until there is more definitive evidence of progression/toxicity.  On progression/toxicity, there are many other options to consider. This includes doxil, eribulin, capecitabine as FDA approved chemotherapy options for metastatic triple negative breast cancer. There is also enhertu, another FDA-approved tADC that can be considered next. Another great option for this patient is clinical trials. We discussed the BICYCLETXLIMITED ([id number]) trial here at [facility], which is a phase 2 study of zelenectide pevedotin in participants with NECTIN4 amplified  metastatic TNBC or HR+HER2- cancer that have not had more than three lines of previous chemotherapies. The patient agreed to be screened for eligibility for this, which involves testing her previous liver biopsy tissue for NECTIN4 amplification.   -we would continue treatment with sacituzumab for now, given positive response. -our research team will let the patient know about her tissue eligibility for the above mentioned clinical trial.  -on progression/toxicity, would consider the above options.  RTC as needed  Katelyn Seale, MD Hematology-Oncology Fellow, PGY-5  Lorene POUR. Dominick, M.D. Multidisciplinary Breast and Brain Metastases Program   Attestation Statement:   I personally saw and evaluated the patient, and participated in the management and treatment plan as documented in the resident/fellow note.  CAREY MARYL SAYER, MD   Future Appointments   This patient does not currently have any appointments scheduled.

## 2023-10-08 ENCOUNTER — Other Ambulatory Visit: Payer: Self-pay | Admitting: Oncology

## 2023-10-08 DIAGNOSIS — C50411 Malignant neoplasm of upper-outer quadrant of right female breast: Secondary | ICD-10-CM

## 2023-10-09 NOTE — Progress Notes (Signed)
 PE Chaperone note: A chaperone was offered for the sensitive portions of the exam but the patient/family declined

## 2023-10-10 NOTE — Unmapped External Note (Signed)
 Oncology CRU Adult Research Participant Consent Note  IRB: [id number]  Study Name: Bicycle [id number]  PI: Dr. Lorene Sayer   I met with the participant to discuss the Bicycle [id number] study per Dr. Sayer' request.  During that discussion, I reviewed the Pre-Screening consent in its entirety with the participant. The conversation included:  . detailed discussions of the purpose of the study,  . the risks and benefits of the study procedures,  . participant's responsibilities, and  . alternatives were reviewed with the treating provider as applicable.    The participant understands that participation is voluntary and the participant may withdraw from participation at any time. The participant acknowledges understanding of the cost section of the informed consent and was made aware that financial counseling is available for further questions. All optional sections of the consent were reviewed with the participant.    No research procedures were done prior to consent. Standard of care items may be used to fulfill study requirements to avoid duplication and unnecessary costs as appropriate.    The participant was given a copy of the consent document to review and time for consideration with family and/or friends. The participant and family were allowed to ask questions, which I answered to the best of my ability. The participant voluntarily agrees to participate in this clinical research and willingly signed the consent form witnessed by consenting personnel. A copy of the signed consent was given to the participant.

## 2023-10-14 NOTE — Assessment & Plan Note (Deleted)
 Patient has breast wound from the breast carcinoma No signs of infection at this time Patient keeping the area clean and applying dressing on it

## 2023-10-14 NOTE — Assessment & Plan Note (Deleted)
 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   Liver biopsy at this time showed low ER positive, PR negative and HER2 negative disease. Started on carboplatin  and Abraxane  and added pembrolizumab  and PD-L1 NGS came back as 10. Progressed on PET scan from 05/2023 and patient was started on Sacituzumab govitecan  on 06/10/2023 Recent PET with marked interval improvement in right breast carcinoma but also showed several new skeletal metastasis.  - The mixed response on PET scan was thought to be due to growth factor.  Continuing sacituzumab govitecan  for now proceed with cycle 6-day 1 today. - Labs reviewed today: Creatinine: Normal, alkaline phosphatase slightly elevated from prior.  Normal LFTs.  CBC: Consistent with anemia with a hemoglobin of 9, mild leukocytosis. -Recent CA 15-3, CA 20 7.29: Trending up. -Discussed with the patient that she is currently on third line of treatment and that her breast cancer is likely behaving like a triple negative breast cancer considering she had very low ER positivity on recent biopsy.  Poor prognosis discussed in detail and that with mixed response on recent PET scan would refer to [facility] for possible clinical trial evaluation and second opinion.  Return to clinic in 3 weeks with labs for follow-up and next cycle of chemotherapy

## 2023-10-14 NOTE — Assessment & Plan Note (Deleted)
 Patient has treatment induced diarrhea that is well-controlled with liquid Imodium  - Continue liquid Imodium as prescribed

## 2023-10-14 NOTE — Assessment & Plan Note (Deleted)
 Patient reported cough when she lays down to sleep and reports a lot of postnasal discharge Started her on Flonase  and pantoprazole .  Patient reported improvement.  - Continue Flonase , cetirizine  and pantoprazole  as prescribed

## 2023-10-14 NOTE — Progress Notes (Unsigned)
 Patient Care Team: [name], MD as PCP - General (Family Medicine) Shaaron, Lamar HERO, MD (Gastroenterology) Shannon Agent, MD as Consulting Physician (Radiation Oncology) [name], MD as Consulting Physician (General Surgery) Glean Stephane BROCKS, RN (Inactive) as Oncology Nurse Navigator Tyree Nanetta SAILOR, RN as Oncology Nurse Navigator Celestia Joesph SQUIBB, RN as Oncology Nurse Navigator (Medical Oncology)  Clinic Day:  10/14/2023  Referring physician: [name], MD   CHIEF COMPLAINT:  CC: Metastatic ER/PR +, HER2-right breast cancer to the bones and liver   ASSESSMENT & PLAN:   Assessment & Plan: Monica Soto  is a 58 y.o. 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   Liver biopsy at this time showed low ER positive, PR negative and HER2 negative disease. Started on carboplatin  and Abraxane  and added pembrolizumab  and PD-L1 NGS came back as 10. Progressed on PET scan from 05/2023 and patient was started on Sacituzumab govitecan  on 06/10/2023 Recent PET with marked interval improvement in right breast carcinoma but also showed several new skeletal metastasis.  - The mixed response on PET scan was thought to be due to growth factor.  Continuing sacituzumab govitecan  for now proceed with cycle 6-day 1 today. - Labs reviewed today: Creatinine: Normal, alkaline phosphatase slightly elevated from prior.  Normal LFTs.  CBC: Consistent with anemia with a hemoglobin of 9, mild leukocytosis. -Recent CA 15-3, CA 20 7.29: Trending up. -Discussed with the patient that she is currently on third line of treatment and that  her breast cancer is likely behaving like a triple negative breast cancer considering she had very low ER positivity on recent biopsy.  Poor prognosis discussed in detail and that with mixed response on recent PET scan would refer to [facility] for possible clinical trial evaluation and second opinion.  Return to clinic in 3 weeks with labs for follow-up and next cycle of chemotherapy  Diarrhea, unspecified type Patient has treatment induced diarrhea that is well-controlled with liquid Imodium  - Continue liquid Imodium as prescribed Subacute cough Patient reported cough when she lays down to sleep and reports a lot of postnasal discharge Started her on Flonase  and pantoprazole .  Patient reported improvement.  - Continue Flonase , cetirizine  and pantoprazole  as prescribed Wound of right breast, sequela Patient has breast wound from the breast carcinoma No signs of infection at this time Patient keeping the area clean and applying dressing on it    The patient understands the plans discussed today and is in agreement with them.  She knows to contact our office if she develops concerns prior to her next appointment.  60 minutes of total time was spent for this patient encounter, including preparation, face-to-face counseling with the patient and coordination of care, physical exam, and documentation of the encounter. > 50% of the time was spent on counseling as documented under my assessment and plan.   Monica Soto,acting as a Neurosurgeon for Mickiel Dry, MD.,have documented all relevant documentation on the behalf of Mickiel Dry, MD,as directed by  Mickiel Dry, MD while in the presence of Mickiel Dry, MD.  I, Mickiel Dry MD, have reviewed the above documentation for accuracy and completeness, and I agree with the above.  Mickiel Dry, MD  [facility]  - A DEPT OF [facility] [address] [place] [address] Dept: [phone] Dept Fax: [phone]   No orders of the defined types were placed in this encounter.    ONCOLOGY HISTORY:   I have reviewed her chart and materials related to her cancer extensively and collaborated history with the patient. 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 There are 2 abnormal right axillary lymph nodes, and a third borderline lymph node. Skin thickening is noted along the medial aspect of the left breast, which may be an extension of the marked skin thickening diffusely on the right. -06/22/2022: Right breast biopsy. Pathology: Invasive moderately differentiated ductal adenocarcinoma, grade 2. ER positive (90%), PR positive (70%), Her2 negative (+1). Ki-67: 20%. 2/2 Right axillary lymph nodes positive for metastatic adenocarcinoma.  -07/12/2022: Initial PET: Intensely hypermetabolic RIGHT breast mass consistent with primary breast carcinoma. Hypermetabolic metastatic RIGHT axillary lymph nodes. Hypermetabolic metastatic lymph node in the posterior triangle of the RIGHT neck. Hypermetabolic LEFT axillary lymph nodes. No evidence of metastatic adenopathy in the mediastinum or abdomen pelvis. No evidence of visceral metastasis. Multiple intensely hypermetabolic skeletal metastasis. -07/04/2022: [facility]:Germline testing : Negative. 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 Abnormal enhancement extends posteriorly to broadly and deeply involve the right chest wall including the pectoralis and underlying intercostal muscles with a small area of abnormal enhancement extending to the underlying pleura.  Enhancement extends medially to the right internal mammary chain. 1.8 cm ill-defined enhancing mass in the lateral left breast suspicious for additional malignancy. Left medial breast skin thickening and underlying trabecular thickening with associated abnormal enhancement suspicious for contralateral extension of inflammatory breast carcinoma. Prominent subcentimeter left axillary lymph nodes.  -07/16/2022: Port placement -07/25/2022-12/13/2022: Anastrozole  and ribociclib  treatment, discontinued due to progression 07/27/2022- Current: Zometa  IV 4 mg every 3 months. -08/08/2022: CA 15-3: 201.0. CA 27.29: 337.7. -11/15/2022: PET:  New hypermetabolic skeletal metastasis.  New hypermetabolic liver metastasis. New hypermetabolic periportal lymph nodes. New hypermetabolic cutaneous tissue in the RIGHT breast. New hypermetabolic glandular tissue in the LEFT breast. Persistent hypermetabolic tissue in the RIGHT breast with extension to the skin surface. -12/01/2022: [employer]: APC G1312, TP53, MYC amplification, FGFR2 amplification, EGFR amplification  -12/07/2022: Liver biopsy. Pathology: Metastatic poorly differentiated adenocarcinoma consistent with breast primary.  ER: Positive, 2%, PR: Negative, 0%, HER2: Negative(1+) -12/17/2022:  PD-L1 (22 C3): 0%  -12/21/2022 -05/30/2023: Abraxane  125mg /m2 with carboplatin  2 AUC added on with cycle 2 on 01/10/2023, discontinued due to progression -01/02/2023: [facility] NGS: MS-stable, TMB-low, PTEN pathogenic variant, genomic LOH high (18%), HER2 (1+), PD-L1 (22 C3) CPS-10.  -03/14/2023-05/16/2023:  Added Pembrolizumab  200mg  every 3 weeks -05/30/2023: PET: Substantially worsened burden of metastatic disease to the liver including new and enlarged lesions. Substantially worsened burden of metastatic disease to the skeleton including new and enlarged lesions. New and increased hypermetabolic nodularity along the right breast cutaneous surface, compatible with cutaneous  metastatic disease. Mildly accentuated metabolic activity in a right hilar lymph node, maximum SUV 3.3 and formerly 2.8. -06/10/2023- current: Sacituzumab govitecan  started with 1 dose of 10mg /kg and currently on 7.5mg /kg every 3 weeks -08/29/2023: PET: Marked  interval improvement in RIGHT breast carcinoma. Marked interval improvement in hepatic metastasis. Mixed response in skeletal metastasis. Several lesions are improved compared to prior where several lesions are new. Persistent widespread skeletal metastasis.  Current Treatment:  Sacituzumab govitecan  7.5mg /kg every 3 weeks  INTERVAL HISTORY:   Monica Soto is here today for follow up. She believes she has a UTI due to dysuria and urinary frequency. She denies any hematuria.  She notes a nighttime reflux cough and was prescribed Protonix  and Flonase  yesterday. Her cough improved after taking Protonix  last night.   She denies any active bleeding, nausea, vomiting, new lumps/bumps, or sores due to treatment. Blimy reports diarrhea that is well controlled with liquid Imodium. She denies any recurrent or frequent infections.   Tessla's right breast wound occasionally produces discharge and oozes. She treats the wound with bandages and neosporin. This is significantly improved compared to drainage and oozing 1 year ago.   She lives in [place] and is willing to go to [facility] for evaluation of clinical trials and second opinion.    I have reviewed the past medical history, past surgical history, social history and family history with the patient and they are unchanged from previous note.  ALLERGIES:  has no known allergies.  MEDICATIONS:  Current Outpatient Medications  Medication Sig Dispense Refill   Aspirin-Acetaminophen -Caffeine (GOODY HEADACHE PO) Take 1 packet by mouth 2 (two) times daily as needed (for pain).     Cholecalciferol 1.25 MG (50000 UT) capsule Take 1 capsule by mouth once a week.     diphenoxylate -atropine  (LOMOTIL )  2.5-0.025 MG tablet Take 2 tablets after first loose stool and 1 after each loose stool thereafter.  Do not exceed 8 tablets in 1 day. 30 tablet 3   fluticasone  (FLONASE ) 50 MCG/ACT nasal spray Place 1 spray into both nostrils daily. 16 g 0   fluticasone  (FLONASE ) 50 MCG/ACT nasal spray Place 1 spray into both nostrils daily. 18.2 mL 2   furosemide  (LASIX ) 20 MG tablet TAKE 1 TABLET(20 MG) BY MOUTH DAILY AS NEEDED FOR SWELLING 30 tablet 2   HYDROcodone -acetaminophen  (NORCO/VICODIN) 5-325 MG tablet Take 1 tablet by mouth every 6 (six) hours as needed for moderate pain. 15 tablet 0   lidocaine -prilocaine  (EMLA ) cream APPLY TOPICALLY TO THE AFFECTED AREA 1 TIME 30 g 3   magnesium  oxide (MAG-OX) 400 (240 Mg) MG tablet Take 1 tablet (400 mg total) by mouth 2 (two) times daily. 60 tablet 3   ondansetron  (ZOFRAN ) 8 MG tablet TAKE 1 TABLET(8 MG) BY MOUTH EVERY 8 HOURS AS NEEDED FOR NAUSEA OR VOMITING. START THIRD DAY AFTER DOXORUBICIN/ CYCLOPHOSPHAMIDE CHEMOTHERAPY 30 tablet 2   pantoprazole  (PROTONIX ) 40 MG tablet Take 1 tablet (40 mg total) by mouth daily. 30 tablet 2   prochlorperazine  (COMPAZINE ) 10 MG tablet Take 1 tablet (10 mg total) by mouth every 6 (six) hours as needed for nausea or vomiting. 30 tablet 2   No current facility-administered medications for this visit.   Facility-Administered Medications Ordered in Other Visits  Medication Dose Route Frequency Provider Last Rate Last Admin   0.9 %  sodium chloride  infusion   Intravenous Continuous Kacee Sukhu, MD   Stopped at 09/24/23 1259    REVIEW OF SYSTEMS:   Constitutional: Positive for fatigue, Denies fevers, chills or abnormal weight loss Eyes: Denies blurriness of vision Ears, nose, mouth, throat, and face: Denies mucositis or sore throat Respiratory: Positive for cough, Denies dyspnea or wheezes Cardiovascular: Denies palpitation, chest discomfort or lower extremity swelling  Gastrointestinal: Positive for diarrhea, Denies  nausea, heartburn or change in bowel habits Skin: Denies abnormal skin rashes Lymphatics: Denies new lymphadenopathy or easy bruising Neurological: Positive for numbness in toes, Denies tingling or new weaknesses Behavioral/Psych: Mood is stable, no new changes  All other systems were reviewed with the patient and are negative.   VITALS:  There were no vitals taken for this visit.  Wt Readings from Last 3 Encounters:  10/01/23 153 lb 12.8 oz (69.8 kg)  09/24/23 151 lb 12.8 oz (68.9 kg)  09/23/23 150 lb 9.6 oz (68.3 kg)    There is no height or weight on file to calculate BMI.  Performance status (ECOG): 1 - Symptomatic but completely ambulatory  PHYSICAL EXAM:   GENERAL:alert, no distress and comfortable SKIN: skin color, texture, turgor are normal, no rashes or significant lesions BREAST: Right breast: Open breast wound with slight discharge, no signs of infection, no odor.  Nontender.  Left breast: Normal breast exam.  Bilateral axillary lymphadenopathy not palpated (picture below) LYMPH:  no palpable lymphadenopathy in the cervical, axillary or inguinal LUNGS: clear to auscultation and percussion with normal breathing effort HEART: regular rate & rhythm and no murmurs and no lower extremity edema ABDOMEN:abdomen soft, non-tender and normal bowel sounds Musculoskeletal:no cyanosis of digits and no clubbing  NEURO: alert & oriented x 3 with fluent speech, no focal motor/sensory deficits   LABORATORY DATA:  I have reviewed the data as listed    Component Value Date/Time   NA 140 10/01/2023 0930   K 3.7 10/01/2023 0930   CL 106 10/01/2023 0930   CO2 21 (L) 10/01/2023 0930   GLUCOSE 103 (H) 10/01/2023 0930   BUN 8 10/01/2023 0930   CREATININE 0.57 10/01/2023 0930   CREATININE 0.70 07/17/2022 0945   CALCIUM 8.6 (L) 10/01/2023 0930   PROT 6.8 10/01/2023 0930   ALBUMIN 3.2 (L) 10/01/2023 0930   AST 15 10/01/2023 0930   AST 11 (L) 07/17/2022 0945   ALT 13 10/01/2023 0930    ALT 14 07/17/2022 0945   ALKPHOS 150 (H) 10/01/2023 0930   BILITOT 0.4 10/01/2023 0930   BILITOT 0.3 07/17/2022 0945   GFRNONAA >60 10/01/2023 0930   GFRNONAA >60 07/17/2022 0945   GFRAA >60 03/29/2015 1616    Lab Results  Component Value Date   WBC 4.0 10/01/2023   NEUTROABS 2.4 10/01/2023   HGB 9.2 (L) 10/01/2023   HCT 29.0 (L) 10/01/2023   MCV 102.8 (H) 10/01/2023   PLT 254 10/01/2023    Latest Reference Range & Units 10/01/23 09:30  CA 15-3 0.0 - 25.0 U/mL 254.0 (H)  CA 27.29 0.0 - 38.6 U/mL 383.7 (H)  (H): Data is abnormally high  RADIOGRAPHIC STUDIES: I have personally reviewed the radiological images as listed and agreed with the findings in the report.  NM PET Image Restag (PS) Skull Base To Thigh CLINICAL DATA:  Subsequent treatment strategy for RIGHT breast carcinoma.  EXAM: NUCLEAR MEDICINE PET SKULL BASE TO THIGH  TECHNIQUE: 8.45 mCi F-18 FDG was injected intravenously. Full-ring PET imaging was performed from the skull base to thigh after the radiotracer. CT data was obtained and used for attenuation correction and anatomic localization.  Fasting blood glucose: 101 mg/dl  COMPARISON:  FDG PET scan 05/30/2023  FINDINGS: NECK: No hypermetabolic lymph nodes in the neck.  Incidental CT findings: None.  CHEST: Decreased metabolic activity within the RIGHT breast. Previous large region intense metabolic activity along the skin surface is contracted too much smaller  region with less radiotracer activity. Residual activity SUV max equal 3.8 on image 72 decreased from a large lesion with SUV max equal 9.0  No axillary adenopathy. No mediastinal adenopathy. No suspicious pulmonary nodules.  Incidental CT findings: Port in the anterior chest wall with tip in distal SVC.  ABDOMEN/PELVIS: Marked interval improvement in central hepatic metastasis. Large confluent lesion in the central LEFT and RIGHT hepatic lobe is reduced in volume. There is residual  metabolic activity liver with SUV max equal 5.7 compared to a large lesion centrally with SUV max equal 9.1.  No new metabolic activity abdomen pelvis.  Metastatic adenopathy.  Incidental CT findings: None.  SKELETON: There is mixed response within the skeleton. Several lesions are improved compared to prior where several lesions are new compared to prior within the skeleton. For example in the RIGHT sacral ala intense radiotracer activity with SUV max equal 7.9 is essentially new from comparison exam. Lesions in the lumbar spine are increased from comparison exam. Lesion thoracic spine are improved from comparison exam. New lesion in the LEFT clavicle head for example with SUV max equal 6.5. Increased activity within the long bones of the humeri and femurs.  Incidental CT findings: None.  IMPRESSION: 1. Marked interval improvement in RIGHT breast carcinoma. 2. Marked interval improvement in hepatic metastasis. 3. Mixed response in skeletal metastasis. Several lesions are improved compared to prior where several lesions are new. Persistent widespread skeletal metastasis.  Electronically Signed   By: Jackquline Boxer M.D.   On: 09/02/2023 11:58

## 2023-10-15 ENCOUNTER — Inpatient Hospital Stay: Admitting: Oncology

## 2023-10-15 ENCOUNTER — Inpatient Hospital Stay (HOSPITAL_BASED_OUTPATIENT_CLINIC_OR_DEPARTMENT_OTHER): Admitting: Oncology

## 2023-10-15 ENCOUNTER — Inpatient Hospital Stay

## 2023-10-15 ENCOUNTER — Encounter: Payer: Self-pay | Admitting: Oncology

## 2023-10-15 ENCOUNTER — Inpatient Hospital Stay: Attending: Hematology and Oncology

## 2023-10-15 VITALS — BP 116/58 | HR 84 | Temp 98.2°F | Resp 18

## 2023-10-15 DIAGNOSIS — C50411 Malignant neoplasm of upper-outer quadrant of right female breast: Secondary | ICD-10-CM

## 2023-10-15 DIAGNOSIS — C7951 Secondary malignant neoplasm of bone: Secondary | ICD-10-CM

## 2023-10-15 DIAGNOSIS — C787 Secondary malignant neoplasm of liver and intrahepatic bile duct: Secondary | ICD-10-CM | POA: Diagnosis not present

## 2023-10-15 DIAGNOSIS — R197 Diarrhea, unspecified: Secondary | ICD-10-CM | POA: Diagnosis not present

## 2023-10-15 DIAGNOSIS — S21001S Unspecified open wound of right breast, sequela: Secondary | ICD-10-CM

## 2023-10-15 DIAGNOSIS — Z1732 Human epidermal growth factor receptor 2 negative status: Secondary | ICD-10-CM | POA: Diagnosis not present

## 2023-10-15 DIAGNOSIS — Z170 Estrogen receptor positive status [ER+]: Secondary | ICD-10-CM | POA: Insufficient documentation

## 2023-10-15 DIAGNOSIS — R234 Changes in skin texture: Secondary | ICD-10-CM | POA: Diagnosis not present

## 2023-10-15 DIAGNOSIS — E611 Iron deficiency: Secondary | ICD-10-CM | POA: Diagnosis not present

## 2023-10-15 DIAGNOSIS — K5900 Constipation, unspecified: Secondary | ICD-10-CM | POA: Diagnosis not present

## 2023-10-15 DIAGNOSIS — R052 Subacute cough: Secondary | ICD-10-CM | POA: Diagnosis not present

## 2023-10-15 DIAGNOSIS — C773 Secondary and unspecified malignant neoplasm of axilla and upper limb lymph nodes: Secondary | ICD-10-CM | POA: Insufficient documentation

## 2023-10-15 DIAGNOSIS — Z5189 Encounter for other specified aftercare: Secondary | ICD-10-CM | POA: Diagnosis not present

## 2023-10-15 DIAGNOSIS — Z1721 Progesterone receptor positive status: Secondary | ICD-10-CM | POA: Diagnosis not present

## 2023-10-15 DIAGNOSIS — D539 Nutritional anemia, unspecified: Secondary | ICD-10-CM | POA: Insufficient documentation

## 2023-10-15 DIAGNOSIS — Z5112 Encounter for antineoplastic immunotherapy: Secondary | ICD-10-CM | POA: Diagnosis present

## 2023-10-15 DIAGNOSIS — D702 Other drug-induced agranulocytosis: Secondary | ICD-10-CM

## 2023-10-15 DIAGNOSIS — R5383 Other fatigue: Secondary | ICD-10-CM | POA: Diagnosis not present

## 2023-10-15 DIAGNOSIS — Z79899 Other long term (current) drug therapy: Secondary | ICD-10-CM | POA: Insufficient documentation

## 2023-10-15 DIAGNOSIS — Z7983 Long term (current) use of bisphosphonates: Secondary | ICD-10-CM | POA: Diagnosis not present

## 2023-10-15 LAB — COMPREHENSIVE METABOLIC PANEL WITH GFR
ALT: 16 U/L (ref 0–44)
AST: 21 U/L (ref 15–41)
Albumin: 3.1 g/dL — ABNORMAL LOW (ref 3.5–5.0)
Alkaline Phosphatase: 170 U/L — ABNORMAL HIGH (ref 38–126)
Anion gap: 11 (ref 5–15)
BUN: 8 mg/dL (ref 6–20)
CO2: 21 mmol/L — ABNORMAL LOW (ref 22–32)
Calcium: 8.5 mg/dL — ABNORMAL LOW (ref 8.9–10.3)
Chloride: 104 mmol/L (ref 98–111)
Creatinine, Ser: 0.49 mg/dL (ref 0.44–1.00)
GFR, Estimated: >60 mL/min (ref >60)
Glucose, Bld: 115 mg/dL — ABNORMAL HIGH (ref 70–99)
Potassium: 3.5 mmol/L (ref 3.5–5.1)
Sodium: 136 mmol/L (ref 135–145)
Total Bilirubin: 0.7 mg/dL (ref 0.0–1.2)
Total Protein: 6.6 g/dL (ref 6.5–8.1)

## 2023-10-15 LAB — CBC WITH DIFFERENTIAL/PLATELET
Abs Immature Granulocytes: 0.07 K/uL (ref 0.00–0.07)
Basophils Absolute: 0.1 K/uL (ref 0.0–0.1)
Basophils Relative: 1 %
Eosinophils Absolute: 0.1 K/uL (ref 0.0–0.5)
Eosinophils Relative: 1 %
HCT: 29.3 % — ABNORMAL LOW (ref 36.0–46.0)
Hemoglobin: 9.3 g/dL — ABNORMAL LOW (ref 12.0–15.0)
Immature Granulocytes: 1 %
Lymphocytes Relative: 11 %
Lymphs Abs: 1 K/uL (ref 0.7–4.0)
MCH: 32.7 pg (ref 26.0–34.0)
MCHC: 31.7 g/dL (ref 30.0–36.0)
MCV: 103.2 fL — ABNORMAL HIGH (ref 80.0–100.0)
Monocytes Absolute: 0.5 K/uL (ref 0.1–1.0)
Monocytes Relative: 6 %
Neutro Abs: 7.8 K/uL — ABNORMAL HIGH (ref 1.7–7.7)
Neutrophils Relative %: 80 %
Platelets: 190 K/uL (ref 150–400)
RBC: 2.84 MIL/uL — ABNORMAL LOW (ref 3.87–5.11)
RDW: 17.2 % — ABNORMAL HIGH (ref 11.5–15.5)
WBC: 9.5 K/uL (ref 4.0–10.5)
nRBC: 0 % (ref 0.0–0.2)

## 2023-10-15 LAB — MAGNESIUM: Magnesium: 1.7 mg/dL (ref 1.7–2.4)

## 2023-10-15 LAB — PHOSPHORUS: Phosphorus: 3.1 mg/dL (ref 2.5–4.6)

## 2023-10-15 MED ORDER — PALONOSETRON HCL INJECTION 0.25 MG/5ML
0.2500 mg | Freq: Once | INTRAVENOUS | Status: AC
  Administered 2023-10-15: 0.25 mg via INTRAVENOUS
  Filled 2023-10-15: qty 5

## 2023-10-15 MED ORDER — ZOLEDRONIC ACID 4 MG/100ML IV SOLN
4.0000 mg | Freq: Once | INTRAVENOUS | Status: AC
  Administered 2023-10-15: 4 mg via INTRAVENOUS
  Filled 2023-10-15: qty 100

## 2023-10-15 MED ORDER — DEXAMETHASONE SODIUM PHOSPHATE 10 MG/ML IJ SOLN
10.0000 mg | Freq: Once | INTRAMUSCULAR | Status: AC
  Administered 2023-10-15: 10 mg via INTRAVENOUS
  Filled 2023-10-15: qty 1

## 2023-10-15 MED ORDER — ACETAMINOPHEN 325 MG PO TABS
650.0000 mg | ORAL_TABLET | Freq: Once | ORAL | Status: AC
  Administered 2023-10-15: 650 mg via ORAL
  Filled 2023-10-15: qty 2

## 2023-10-15 MED ORDER — ATROPINE SULFATE 1 MG/ML IV SOLN
0.5000 mg | Freq: Once | INTRAVENOUS | Status: AC
  Administered 2023-10-15: 0.5 mg via INTRAVENOUS
  Filled 2023-10-15: qty 1

## 2023-10-15 MED ORDER — SODIUM CHLORIDE 0.9 % IV SOLN: INTRAVENOUS | Status: DC

## 2023-10-15 MED ORDER — CETIRIZINE HCL 10 MG/ML IV SOLN
10.0000 mg | Freq: Once | INTRAVENOUS | Status: AC
  Administered 2023-10-15: 10 mg via INTRAVENOUS
  Filled 2023-10-15: qty 1

## 2023-10-15 MED ORDER — FAMOTIDINE IN NACL 20-0.9 MG/50ML-% IV SOLN
20.0000 mg | Freq: Once | INTRAVENOUS | Status: AC
  Administered 2023-10-15: 20 mg via INTRAVENOUS
  Filled 2023-10-15: qty 50

## 2023-10-15 MED ORDER — SODIUM CHLORIDE 0.9 % IV SOLN
150.0000 mg | Freq: Once | INTRAVENOUS | Status: AC
  Administered 2023-10-15: 150 mg via INTRAVENOUS
  Filled 2023-10-15: qty 150

## 2023-10-15 MED ORDER — SODIUM CHLORIDE 0.9 % IV SOLN
7.5000 mg/kg | Freq: Once | INTRAVENOUS | Status: AC
  Administered 2023-10-15: 540 mg via INTRAVENOUS
  Filled 2023-10-15: qty 54

## 2023-10-15 NOTE — Assessment & Plan Note (Addendum)
 Likely secondary to chemotherapy but also has severe iron  deficiency with TSAT:8 Ferritin:427, likely elevated because of chronic inflammation  - Will administer one dose of IV venofer . Discussed side effects including allergic reactions, headache and nausea - Start taking iron  pills every other day. Use Miralax for constipation

## 2023-10-15 NOTE — Progress Notes (Addendum)
 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   Liver biopsy at this time showed low ER positive, PR negative and HER2 negative disease. Started on carboplatin  and Abraxane  and added pembrolizumab  and PD-L1 NGS came back as 10. Progressed on PET scan from 05/2023 and patient was started on Sacituzumab govitecan  on 06/10/2023 Recent PET with marked interval improvement in right breast carcinoma but also showed several new skeletal metastasis.  - The mixed response on PET scan was thought to be due to growth factor.  Continuing sacituzumab govitecan  for now proceed with cycle 7-day 1 today. - Labs reviewed today: CMP: Creatinine: Normal, alkaline phosphatase slightly elevated from prior.  Normal LFTs.  CBC: Consistent with anemia with a hemoglobin of 9.3, platelets: 190 -Recent CA 15-3, CA 20 7.29: Trending up. - Was seen at [facility] for second opinion and possible clinical trial  evaluation.  She has been tested for negative for for possible phase 2 clinical trial.  Recommended continuing Sacituzumab govitecan  at this time. - Will repeat PET scan 11/2023  Return to clinic in 6 weeks with labs for follow-up and PET scan results  Diarrhea, unspecified type Patient has treatment induced diarrhea that is well-controlled with liquid Imodium  - Continue liquid Imodium as prescribed Subacute cough Patient reported cough when she lays down to sleep and reports a lot of postnasal discharge Patient reported improvement.  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 Discussed side effects including allergic reactions, headache and nausea - Start taking iron  pills every other day. Use Miralax for constipation    The patient understands the plans discussed today and is in agreement with them.  She knows to contact our office if she develops concerns prior to her next appointment.  30 minutes of total time was spent for this patient encounter, including preparation, face-to-face counseling with the patient and coordination of care, physical exam, and documentation of the encounter. > 50% of the time was spent on counseling as documented under my  assessment and plan.    Monica Soto,acting as a Neurosurgeon for Mickiel Dry, MD.,have documented  all relevant documentation on the behalf of Mickiel Dry, MD,as directed by  Mickiel Dry, MD while in the presence of Mickiel Dry, MD.  I, Mickiel Dry MD, have reviewed the above documentation for accuracy and completeness, and I agree with the above.      Mickiel Dry, MD  [facility] [place] - A DEPT OF [facility] [address] [place] [employer] [place] [place] [address] Dept: [phone] Dept Fax: [phone]   Orders Placed This Encounter  Procedures   NM PET Image Restage (PS) Skull Base to Thigh (F-18 FDG)    Standing Status:   Future    Expected Date:   11/14/2023    Expiration Date:   10/14/2024    If indicated for the ordered procedure, I authorize the administration of a radiopharmaceutical per Radiology protocol:   Yes    Preferred imaging location?:   [facility]     ONCOLOGY HISTORY:   I have reviewed her chart and materials related to her cancer extensively and collaborated history with the patient. 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 There are 2 abnormal right axillary lymph nodes, and a third borderline lymph node. Skin thickening is noted along the medial aspect of the left breast, which may be an extension of the marked skin thickening diffusely on the right. -06/22/2022: Right breast biopsy. Pathology: Invasive moderately differentiated ductal adenocarcinoma, grade 2. ER positive (90%), PR positive (70%), Her2 negative (+1). Ki-67: 20%. 2/2 Right axillary lymph nodes positive for metastatic adenocarcinoma.  -07/12/2022: Initial PET: Intensely hypermetabolic RIGHT breast mass consistent with primary breast carcinoma. Hypermetabolic metastatic RIGHT axillary lymph nodes. Hypermetabolic metastatic lymph node in the posterior  triangle of the RIGHT neck. Hypermetabolic LEFT axillary lymph nodes. No evidence of metastatic adenopathy in the mediastinum or abdomen pelvis. No evidence of visceral metastasis. Multiple intensely hypermetabolic skeletal metastasis. -07/04/2022: [facility]:Germline testing : Negative. 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 Abnormal enhancement extends posteriorly to broadly and deeply involve the right chest wall including the pectoralis and underlying intercostal muscles with a small area of abnormal enhancement extending to the underlying pleura. Enhancement extends medially to the right internal mammary chain. 1.8 cm ill-defined enhancing mass in the lateral left breast suspicious for additional malignancy. Left medial breast skin thickening and underlying trabecular thickening with associated abnormal enhancement suspicious for contralateral extension of inflammatory breast carcinoma. Prominent subcentimeter left axillary lymph nodes.  -07/16/2022: Port placement -07/25/2022-12/13/2022: Anastrozole  and ribociclib  treatment, discontinued due to progression 07/27/2022- Current: Zometa  IV 4 mg every 3 months. -08/08/2022: CA 15-3: 201.0. CA 27.29: 337.7. -11/15/2022: PET:  New hypermetabolic skeletal metastasis.  New hypermetabolic liver metastasis. New hypermetabolic periportal lymph nodes. New hypermetabolic cutaneous tissue in the RIGHT breast. New hypermetabolic glandular tissue in the LEFT breast. Persistent hypermetabolic tissue in the RIGHT breast with extension to the skin surface. -12/01/2022: [employer]: APC G1312, TP53, MYC amplification, FGFR2 amplification, EGFR amplification  -12/07/2022: Liver biopsy. Pathology: Metastatic poorly differentiated adenocarcinoma consistent with breast primary.  ER: Positive, 2%, PR: Negative, 0%, HER2:  Negative(1+) -12/17/2022:  PD-L1 (22  C3): 0%  -12/21/2022 -05/30/2023: Abraxane  125mg /m2 with carboplatin  2 AUC added on with cycle 2 on 01/10/2023, discontinued due to progression -01/02/2023: [facility] NGS: MS-stable, TMB-low, PTEN pathogenic variant, genomic LOH high (18%), HER2 (1+), PD-L1 (22 C3) CPS-10.  -03/14/2023-05/16/2023:  Added Pembrolizumab  200mg  every 3 weeks -05/30/2023: PET: Substantially worsened burden of metastatic disease to the liver including new and enlarged lesions. Substantially worsened burden of metastatic disease to the skeleton including new and enlarged lesions. New and increased hypermetabolic nodularity along the right breast cutaneous surface, compatible with cutaneous metastatic disease. Mildly accentuated metabolic activity in a right hilar lymph node, maximum SUV 3.3 and formerly 2.8. -06/10/2023- current: Sacituzumab govitecan  started with 1 dose of 10mg /kg and currently on 7.5mg /kg every 3 weeks -08/29/2023: PET: Marked interval improvement in RIGHT breast carcinoma. Marked interval improvement in hepatic metastasis. Mixed response in skeletal metastasis. Several lesions are improved compared to prior where several lesions are new. Persistent widespread skeletal metastasis. -10/09/2023: Seen at [facility] by Francetta Sayer. Recommended continuing Sacituzumab govitecan  at this time but also recommended Enhertu at progression.  She has been tested for nectin for amplification to be considered for a clinical trial.  Current Treatment:  Sacituzumab govitecan  7.5mg /kg every 3 weeks  INTERVAL HISTORY:   Monica Soto is here today for follow up. She reports lethargy and fatigue over the past few days, but otherwise has no complaints today. She denies any nausea, vomiting, or diarrhea. Her cough is stable, and states it continues to be worsened at night. Her right breast wound is improving.   She has been seen by [facility] since her last visit with me and is being evaluated for a  phase II clinical trial.  We will continue current treatment at [facility]    I have reviewed the past medical history, past surgical history, social history and family history with the patient and they are unchanged from previous note.  ALLERGIES:  has no known allergies.  MEDICATIONS:  Current Outpatient Medications  Medication Sig Dispense Refill   Aspirin-Acetaminophen -Caffeine (GOODY HEADACHE PO) Take 1 packet by mouth 2 (two) times daily as needed (for pain).     Cholecalciferol 1.25 MG (50000 UT) capsule Take 1 capsule by mouth once a week.     diphenoxylate -atropine  (LOMOTIL ) 2.5-0.025 MG tablet Take 2 tablets after first loose stool and 1 after each loose stool thereafter.  Do not exceed 8 tablets in 1 day. 30 tablet 3   fluticasone  (FLONASE ) 50 MCG/ACT nasal spray Place 1 spray into both nostrils daily. 16 g 0   fluticasone  (FLONASE ) 50 MCG/ACT nasal spray Place 1 spray into both nostrils daily. 18.2 mL 2   furosemide  (LASIX ) 20 MG tablet TAKE 1 TABLET(20 MG) BY MOUTH DAILY AS NEEDED FOR SWELLING 30 tablet 2   lidocaine -prilocaine  (EMLA ) cream APPLY TOPICALLY TO THE AFFECTED AREA 1 TIME 30 g 3   magnesium  oxide (MAG-OX) 400 (240 Mg) MG tablet Take 1 tablet (400 mg total) by mouth 2 (two) times daily. 60 tablet 3   ondansetron  (ZOFRAN ) 8 MG tablet TAKE 1 TABLET(8 MG) BY MOUTH EVERY 8 HOURS AS NEEDED FOR NAUSEA OR VOMITING. START THIRD DAY AFTER DOXORUBICIN/ CYCLOPHOSPHAMIDE CHEMOTHERAPY 30 tablet 2   pantoprazole  (PROTONIX ) 40 MG tablet Take 1 tablet (40 mg total) by mouth daily. 30 tablet 2   prochlorperazine  (COMPAZINE ) 10 MG tablet Take 1 tablet (10 mg total) by mouth every 6 (six) hours as needed for nausea or vomiting. 30 tablet 2   HYDROcodone -acetaminophen  (NORCO/VICODIN) 5-325  MG tablet Take 1 tablet by mouth every 6 (six) hours as needed for moderate pain. (Patient not taking: Reported on 10/15/2023) 15 tablet 0   No current facility-administered medications for this visit.    Facility-Administered Medications Ordered in Other Visits  Medication Dose Route Frequency Provider Last Rate Last Admin   0.9 %  sodium chloride  infusion   Intravenous Continuous Javonnie Illescas, MD   Stopped at 09/24/23 1259   0.9 %  sodium chloride  infusion   Intravenous Continuous Yigit Norkus, MD   Stopped at 10/15/23 1409    REVIEW OF SYSTEMS:   Constitutional: Positive for fatigue, Denies fevers, chills or abnormal weight loss Eyes: Denies blurriness of vision Ears, nose, mouth, throat, and face: Denies mucositis or sore throat Respiratory: Positive for cough, Denies dyspnea or wheezes Cardiovascular: Denies palpitation, chest discomfort or lower extremity swelling Gastrointestinal: Denies nausea, vomiting, diarrhea, heartburn or change in bowel habits Skin: Denies abnormal skin rashes Lymphatics: Denies new lymphadenopathy or easy bruising Neurological: Positive for numbness in toes, Denies tingling or new weaknesses Behavioral/Psych: Mood is stable, no new changes  All other systems were reviewed with the patient and are negative.   VITALS:  There were no vitals taken for this visit.  Wt Readings from Last 3 Encounters:  10/15/23 156 lb (70.8 kg)  10/01/23 153 lb 12.8 oz (69.8 kg)  09/24/23 151 lb 12.8 oz (68.9 kg)    There is no height or weight on file to calculate BMI.  Performance status (ECOG): 1 - Symptomatic but completely ambulatory  PHYSICAL EXAM:   GENERAL:alert, no distress and comfortable SKIN: skin color, texture, turgor are normal, no rashes or significant lesions LYMPH:  no palpable lymphadenopathy in the cervical, axillary or inguinal LUNGS: clear to auscultation and percussion with normal breathing effort HEART: regular rate & rhythm and no murmurs and no lower extremity edema ABDOMEN:abdomen soft, non-tender and normal bowel sounds Musculoskeletal:no cyanosis of digits and no clubbing  NEURO: alert & oriented x 3 with fluent speech, no  focal motor/sensory deficits   LABORATORY DATA:  I have reviewed the data as listed    Component Value Date/Time   NA 136 10/15/2023 0944   K 3.5 10/15/2023 0944   CL 104 10/15/2023 0944   CO2 21 (L) 10/15/2023 0944   GLUCOSE 115 (H) 10/15/2023 0944   BUN 8 10/15/2023 0944   CREATININE 0.49 10/15/2023 0944   CREATININE 0.70 07/17/2022 0945   CALCIUM 8.5 (L) 10/15/2023 0944   PROT 6.6 10/15/2023 0944   ALBUMIN 3.1 (L) 10/15/2023 0944   AST 21 10/15/2023 0944   AST 11 (L) 07/17/2022 0945   ALT 16 10/15/2023 0944   ALT 14 07/17/2022 0945   ALKPHOS 170 (H) 10/15/2023 0944   BILITOT 0.7 10/15/2023 0944   BILITOT 0.3 07/17/2022 0945   GFRNONAA >60 10/15/2023 0944   GFRNONAA >60 07/17/2022 0945   GFRAA >60 03/29/2015 1616    Lab Results  Component Value Date   WBC 9.5 10/15/2023   NEUTROABS 7.8 (H) 10/15/2023   HGB 9.3 (L) 10/15/2023   HCT 29.3 (L) 10/15/2023   MCV 103.2 (H) 10/15/2023   PLT 190 10/15/2023    Latest Reference Range & Units 10/01/23 09:30  CA 15-3 0.0 - 25.0 U/mL 254.0 (H)  CA 27.29 0.0 - 38.6 U/mL 383.7 (H)  (H): Data is abnormally high  RADIOGRAPHIC STUDIES: I have personally reviewed the radiological images as listed and agreed with the findings in the report.  NM PET  Image Restag (PS) Skull Base To Thigh CLINICAL DATA:  Subsequent treatment strategy for RIGHT breast carcinoma.  EXAM: NUCLEAR MEDICINE PET SKULL BASE TO THIGH  TECHNIQUE: 8.45 mCi F-18 FDG was injected intravenously. Full-ring PET imaging was performed from the skull base to thigh after the radiotracer. CT data was obtained and used for attenuation correction and anatomic localization.  Fasting blood glucose: 101 mg/dl  COMPARISON:  FDG PET scan 05/30/2023  FINDINGS: NECK: No hypermetabolic lymph nodes in the neck.  Incidental CT findings: None.  CHEST: Decreased metabolic activity within the RIGHT breast. Previous large region intense metabolic activity along the  skin surface is contracted too much smaller region with less radiotracer activity. Residual activity SUV max equal 3.8 on image 72 decreased from a large lesion with SUV max equal 9.0  No axillary adenopathy. No mediastinal adenopathy. No suspicious pulmonary nodules.  Incidental CT findings: Port in the anterior chest wall with tip in distal SVC.  ABDOMEN/PELVIS: Marked interval improvement in central hepatic metastasis. Large confluent lesion in the central LEFT and RIGHT hepatic lobe is reduced in volume. There is residual metabolic activity liver with SUV max equal 5.7 compared to a large lesion centrally with SUV max equal 9.1.  No new metabolic activity abdomen pelvis.  Metastatic adenopathy.  Incidental CT findings: None.  SKELETON: There is mixed response within the skeleton. Several lesions are improved compared to prior where several lesions are new compared to prior within the skeleton. For example in the RIGHT sacral ala intense radiotracer activity with SUV max equal 7.9 is essentially new from comparison exam. Lesions in the lumbar spine are increased from comparison exam. Lesion thoracic spine are improved from comparison exam. New lesion in the LEFT clavicle head for example with SUV max equal 6.5. Increased activity within the long bones of the humeri and femurs.  Incidental CT findings: None.  IMPRESSION: 1. Marked interval improvement in RIGHT breast carcinoma. 2. Marked interval improvement in hepatic metastasis. 3. Mixed response in skeletal metastasis. Several lesions are improved compared to prior where several lesions are new. Persistent widespread skeletal metastasis.  Electronically Signed   By: Jackquline Boxer M.D.   On: 09/02/2023 11:58

## 2023-10-15 NOTE — Assessment & Plan Note (Addendum)
 Patient reported cough when she lays down to sleep and reports a lot of postnasal discharge Patient reported improvement.  - Continue Flonase , cetirizine  and pantoprazole  as prescribed

## 2023-10-15 NOTE — Assessment & Plan Note (Addendum)
 Patient has treatment induced diarrhea that is well-controlled with liquid Imodium  - Continue liquid Imodium as prescribed

## 2023-10-15 NOTE — Progress Notes (Signed)
 Patient has been examined by Dr. Davonna. Vital signs and labs have been reviewed by MD - ANC, Creatinine, LFTs, hemoglobin, and platelets have been reviewed by M.D. - pt may proceed with treatment.  Primary RN and pharmacy notified.

## 2023-10-15 NOTE — Assessment & Plan Note (Addendum)
 Patient has breast wound from the breast carcinoma No signs of infection at this time Patient keeping the area clean and applying dressing on it

## 2023-10-15 NOTE — Patient Instructions (Signed)
 [facility]  - A DEPT OF [facility]  Discharge Instructions: Thank you for choosing [facility] to provide your oncology and hematology care.  If you have a lab appointment with the Cancer Center - please note that after April 8th, 2024, all labs will be drawn in the cancer center.  You do not have to check in or register with the main entrance as you have in the past but will complete your check-in in the cancer center.  Wear comfortable clothing and clothing appropriate for easy access to any Portacath or PICC line.   We strive to give you quality time with your provider. You may need to reschedule your appointment if you arrive late (15 or more minutes).  Arriving late affects you and other patients whose appointments are after yours.  Also, if you miss three or more appointments without notifying the office, you may be dismissed from the clinic at the provider's discretion.      For prescription refill requests, have your pharmacy contact our office and allow 72 hours for refills to be completed.    Sacituzumab Govitecan  Injection What is this medication? SACITUZUMAB GOVITECAN  (SAK i TOOZ ue mab GOE vi TEE can) treats breast cancer. It works by blocking a protein that causes cancer cells to grow and multiply. This helps to slow or stop the spread of cancer cells. This medicine may be used for other purposes; ask your health care provider or pharmacist if you have questions. COMMON BRAND NAME(S): TRODELVY  What should I tell my care team before I take this medication? They need to know if you have any of these conditions: Carry the UGT1A1*28 gene Infection Liver disease An unusual or allergic reaction to sacituzumab govitecan , other medications, foods, dyes, or preservatives Pregnant or trying to get pregnant Breast-feeding How should I use this medication? This medication is injected into a vein. It is given by your care team in a hospital or  clinic setting. Talk to your care team about the use of this medication in children. Special care may be needed. Overdosage: If you think you have taken too much of this medicine contact a poison control center or emergency room at once. NOTE: This medicine is only for you. Do not share this medicine with others. What if I miss a dose? Keep appointments for follow-up doses. It is important not to miss your dose. Call your care team if you are unable to keep an appointment. What may interact with this medication? This medication may affect how other medications work, and other medications may affect the way this medication works. Talk with your care team about all of the medications you take. They may suggest changes to your treatment plan to lower the risk of side effects and to make sure your medications work as intended. This list may not describe all possible interactions. Give your health care provider a list of all the medicines, herbs, non-prescription drugs, or dietary supplements you use. Also tell them if you smoke, drink alcohol, or use illegal drugs. Some items may interact with your medicine. What should I watch for while using this medication? This medication may make you feel generally unwell. This is not uncommon as chemotherapy can affect healthy cells as well as cancer cells. Report any side effects. Continue your course of treatment even though you feel ill unless your care team tells you to stop. You may need blood work while you are taking this medication. Certain  genetic factors may decrease the safety of this medication. Your care team may use genetic tests to determine treatment. This medication can cause serious allergic reactions. To reduce your risk, your care team may give you other medications to take before receiving this one. Be sure to follow the directions from your care team. Check with your care team if you have severe diarrhea, nausea, and vomiting, or if you sweat a  lot. The loss of too much body fluid may make it dangerous for you to take this medication. Talk to your care team if you wish to become pregnant or think you might be pregnant. This medication can cause serious birth defects if taken during pregnancy or if you get pregnant within 6 months after stopping treatment. A negative pregnancy test is required before starting this medication. A reliable form of contraception is recommended while taking this medication and for 6 months after stopping treatment. Talk to your care team about reliable forms of contraception. Use a condom during sex and for 3 months after stopping treatment. Tell your care team right away if you think your partner might be pregnant. This medication can cause serious birth defects. Do not breast-feed while taking this medication and for 1 month after stopping therapy. This medication may cause infertility. Talk to your care team if you are concerned about your fertility. This medication may increase your risk of getting an infection. Call your care team for advice if you get a fever, chills, sore throat, or other symptoms of a cold or flu. Do not treat yourself. Try to avoid being around people who are sick. Avoid taking medications that contain aspirin, acetaminophen , ibuprofen , naproxen , or ketoprofen unless instructed by your care team. These medications may hide a fever. This medication may increase blood sugar. The risk may be higher in patients who already have diabetes. Ask your care team what you can do to lower your risk of diabetes while taking this medication. What side effects may I notice from receiving this medication? Side effects that you should report to your care team as soon as possible: Allergic reactions--skin rash, itching, hives, swelling of the face, lips, tongue, or throat Infection--fever, chills, cough, or sore throat Infusion reactions--chest pain, shortness of breath or trouble breathing, feeling faint or  lightheaded Low red blood cell level--unusual weakness or fatigue, dizziness, headache, trouble breathing Severe or prolonged diarrhea Side effects that usually do not require medical attention (report these to your care team if they continue or are bothersome): Constipation Diarrhea Fatigue Hair loss Loss of appetite Nausea Vomiting This list may not describe all possible side effects. Call your doctor for medical advice about side effects. You may report side effects to FDA at 1-800-FDA-1088. Where should I keep my medication? This medication is given in a hospital or clinic. It will not be stored at home. NOTE: This sheet is a summary. It may not cover all possible information. If you have questions about this medicine, talk to your doctor, pharmacist, or health care provider.  2025 Elsevier/Gold Standard (2023-01-07 00:00:00)   To help prevent nausea and vomiting after your treatment, we encourage you to take your nausea medication as directed.  BELOW ARE SYMPTOMS THAT SHOULD BE REPORTED IMMEDIATELY: *FEVER GREATER THAN 100.4 F (38 C) OR HIGHER *CHILLS OR SWEATING *NAUSEA AND VOMITING THAT IS NOT CONTROLLED WITH YOUR NAUSEA MEDICATION *UNUSUAL SHORTNESS OF BREATH *UNUSUAL BRUISING OR BLEEDING *URINARY PROBLEMS (pain or burning when urinating, or frequent urination) *BOWEL PROBLEMS (unusual diarrhea, constipation, pain  near the anus) TENDERNESS IN MOUTH AND THROAT WITH OR WITHOUT PRESENCE OF ULCERS (sore throat, sores in mouth, or a toothache) UNUSUAL RASH, SWELLING OR PAIN  UNUSUAL VAGINAL DISCHARGE OR ITCHING   Items with * indicate a potential emergency and should be followed up as soon as possible or go to the Emergency Department if any problems should occur.  Please show the CHEMOTHERAPY ALERT CARD or IMMUNOTHERAPY ALERT CARD at check-in to the Emergency Department and triage nurse.  Should you have questions after your visit or need to cancel or reschedule your  appointment, please contact [facility] [place] - A DEPT OF [facility] [phone]  and follow the prompts.  Office hours are 8:00 a.m. to 4:30 p.m. Monday - Friday. Please note that voicemails left after 4:00 p.m. may not be returned until the following business day.  We are closed weekends and major holidays. You have access to a nurse at all times for urgent questions. Please call the main number to the clinic [phone] and follow the prompts.  For any non-urgent questions, you may also contact your provider using MyChart. We now offer e-Visits for anyone 57 and older to request care online for non-urgent symptoms. For details visit [email or web address].   Also download the MyChart app! Go to the app store, search MyChart, open the app, select [place], and log in with your MyChart username and password.

## 2023-10-15 NOTE — Assessment & Plan Note (Addendum)
 Patient has multiple bony metastasis that are showing mixed response at this time Currently on Zometa  4 mg every 3 months  -Continue Zometa  4 mg IV every 3 months -Continue calcium and vitamin D supplementation

## 2023-10-15 NOTE — Patient Instructions (Signed)
 [facility] at [facility] Discharge Instructions   You were seen and examined today by Dr. Davonna.  She reviewed the results of your lab work which are normal/stable.   Your iron  levels are low. We will arrange for you to have an iron  infusion.   We will proceed with your treatment today.   Return as scheduled.    Thank you for choosing [facility] at [facility] to provide your oncology and hematology care.  To afford each patient quality time with our provider, please arrive at least 15 minutes before your scheduled appointment time.   If you have a lab appointment with the Cancer Center please come in thru the Main Entrance and check in at the main information desk.  You need to re-schedule your appointment should you arrive 10 or more minutes late.  We strive to give you quality time with our providers, and arriving late affects you and other patients whose appointments are after yours.  Also, if you no show three or more times for appointments you may be dismissed from the clinic at the providers discretion.     Again, thank you for choosing [facility].  Our hope is that these requests will decrease the amount of time that you wait before being seen by our physicians.       _____________________________________________________________  Should you have questions after your visit to [facility], please contact our office at [phone] and follow the prompts.  Our office hours are 8:00 a.m. and 4:30 p.m. Monday - Friday.  Please note that voicemails left after 4:00 p.m. may not be returned until the following business day.  We are closed weekends and major holidays.  You do have access to a nurse 24-7, just call the main number to the clinic [phone] and do not press any options, hold on the line and a nurse will answer the phone.    For prescription refill requests, have your pharmacy contact our office and  allow 72 hours.    Due to Covid, you will need to wear a mask upon entering the hospital. If you do not have a mask, a mask will be given to you at the Main Entrance upon arrival. For doctor visits, patients may have 1 support person age 76 or older with them. For treatment visits, patients can not have anyone with them due to social distancing guidelines and our immunocompromised population.

## 2023-10-15 NOTE — Progress Notes (Signed)

## 2023-10-15 NOTE — Assessment & Plan Note (Addendum)
 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   Liver biopsy at this time showed low ER positive, PR negative and HER2 negative disease. Started on carboplatin  and Abraxane  and added pembrolizumab  and PD-L1 NGS came back as 10. Progressed on PET scan from 05/2023 and patient was started on Sacituzumab govitecan  on 06/10/2023 Recent PET with marked interval improvement in right breast carcinoma but also showed several new skeletal metastasis.  - The mixed response on PET scan was thought to be due to growth factor.  Continuing sacituzumab govitecan  for now proceed with cycle 7-day 1 today. - Labs reviewed today: CMP: Creatinine: Normal, alkaline phosphatase slightly elevated from prior.  Normal LFTs.  CBC: Consistent with anemia with a hemoglobin of 9.3, platelets: 190 -Recent CA 15-3, CA 20 7.29: Trending up. - Was seen at [facility] for second opinion and possible clinical trial evaluation.  She has been tested for negative for for possible phase 2 clinical trial.  Recommended continuing Sacituzumab govitecan  at this time. - Will repeat PET scan 11/2023  Return to clinic in 6 weeks with labs for follow-up and PET scan results

## 2023-10-16 ENCOUNTER — Other Ambulatory Visit: Payer: Self-pay

## 2023-10-17 ENCOUNTER — Inpatient Hospital Stay

## 2023-10-17 VITALS — BP 116/71 | HR 74 | Temp 96.9°F | Resp 18

## 2023-10-17 DIAGNOSIS — Z170 Estrogen receptor positive status [ER+]: Secondary | ICD-10-CM

## 2023-10-17 DIAGNOSIS — D702 Other drug-induced agranulocytosis: Secondary | ICD-10-CM

## 2023-10-17 DIAGNOSIS — Z5112 Encounter for antineoplastic immunotherapy: Secondary | ICD-10-CM | POA: Diagnosis not present

## 2023-10-17 MED ORDER — ACETAMINOPHEN 325 MG PO TABS
650.0000 mg | ORAL_TABLET | Freq: Once | ORAL | Status: AC
  Administered 2023-10-17: 650 mg via ORAL
  Filled 2023-10-17: qty 2

## 2023-10-17 MED ORDER — IRON SUCROSE 400 MG IVPB - SIMPLE MED
400.0000 mg | Freq: Once | Status: AC
  Administered 2023-10-17: 400 mg via INTRAVENOUS
  Filled 2023-10-17: qty 400

## 2023-10-17 MED ORDER — CETIRIZINE HCL 10 MG/ML IV SOLN
10.0000 mg | Freq: Once | INTRAVENOUS | Status: AC
  Administered 2023-10-17: 10 mg via INTRAVENOUS
  Filled 2023-10-17: qty 1

## 2023-10-17 MED ORDER — SODIUM CHLORIDE 0.9 % IV SOLN: INTRAVENOUS | Status: DC

## 2023-10-17 NOTE — Progress Notes (Signed)
 Patient tolerated iron infusion with no complaints voiced.  Port IV site clean and dry with good blood return noted before and after infusion.  Band aid applied.  VSS with discharge and left in satisfactory condition with no s/s of distress noted.

## 2023-10-17 NOTE — Progress Notes (Signed)
   10/17/23 1000  Spiritual Encounters  Type of Visit Follow up  Care provided to: Patient  Referral source Patient request  Reason for visit Routine spiritual support  Spiritual Framework  Presenting Themes Meaning/purpose/sources of inspiration;Goals in life/care;Coping tools;Impactful experiences and emotions  Community/Connection Family;Friend(s)  Patient Stress Factors Health changes  Family Stress Factors Not reviewed  Interventions  Spiritual Care Interventions Made Reflective listening;Compassionate presence;Narrative/life review;Encouragement;Reconciliation with self/others  Intervention Outcomes  Outcomes Awareness of support;Reduced isolation;Connection to spiritual care  Spiritual Care Plan  Spiritual Care Issues Still Outstanding Chaplain will continue to follow   Reason for Visit: Chaplain was rounding on the floor and Pt called out from her room and waved me in. (Pt previously known to chaplain)  Description of Visit: Upon arrival in the room, Monica Soto was seated in her recliner with no support person present.  She was pleased to see me and we picked up easy conversation as if we had just been talking yesterday.  Monica Soto shared with me that Dr. Davonna had sent her to [facility] for a second opinion and how much this has really terrified her.  Monica Soto had assumed that this meant that things were getting desperate.  She was pleasantly surprised by her experience there and that she had been assured that she still had plenty of options in her treatment.  Monica Soto specifically mentioned that before going to the [facility] appointment she was overwhelmed with depression.  She had been looking at buying a new pair of pants from [employer], but then decided not to get them, saying, "I'll probably be dead before I can even wear them."  She stated that she even shocked herself when she said that.  Monica Soto time at [facility] and her follow-up here with Dr. Davonna renewed her strength to fight and she is encouraged again  about her treatment.  Plan of Care: I will continue to check in with Monica Soto at her treatments bi-weekly   Monica Soto, MDiv  Chaplain, [facility] [email or web address] [phone]

## 2023-10-17 NOTE — Patient Instructions (Signed)
 [facility] [place] - A DEPT OF [facility]  Discharge Instructions: Thank you for choosing [facility] to provide your oncology and hematology care.  If you have a lab appointment with the Cancer Center - please note that after April 8th, 2024, all labs will be drawn in the cancer center.  You do not have to check in or register with the main entrance as you have in the past but will complete your check-in in the cancer center.  Wear comfortable clothing and clothing appropriate for easy access to any Portacath or PICC line.   We strive to give you quality time with your provider. You may need to reschedule your appointment if you arrive late (15 or more minutes).  Arriving late affects you and other patients whose appointments are after yours.  Also, if you miss three or more appointments without notifying the office, you may be dismissed from the clinic at the provider's discretion.      For prescription refill requests, have your pharmacy contact our office and allow 72 hours for refills to be completed.    Iron Sucrose Injection What is this medication? IRON SUCROSE (EYE ern SOO krose) treats low levels of iron (iron deficiency anemia) in people with kidney disease. Iron is a mineral that plays an important role in making red blood cells, which carry oxygen from your lungs to the rest of your body. This medicine may be used for other purposes; ask your health care provider or pharmacist if you have questions. COMMON BRAND NAME(S): Venofer What should I tell my care team before I take this medication? They need to know if you have any of these conditions: Anemia not caused by low iron levels Heart disease High levels of iron in the blood Kidney disease Liver disease An unusual or allergic reaction to iron, other medications, foods, dyes, or preservatives Pregnant or trying to get pregnant Breastfeeding How should I use this medication? This  medication is infused into a vein. It is given by your care team in a hospital or clinic setting. Talk to your care team about the use of this medication in children. While it may be prescribed for children as young as 2 years for selected conditions, precautions do apply. Overdosage: If you think you have taken too much of this medicine contact a poison control center or emergency room at once. NOTE: This medicine is only for you. Do not share this medicine with others. What if I miss a dose? Keep appointments for follow-up doses. It is important not to miss your dose. Call your care team if you are unable to keep an appointment. What may interact with this medication? Do not take this medication with any of the following: Deferoxamine Dimercaprol Other iron products This medication may also interact with the following: Chloramphenicol Deferasirox This list may not describe all possible interactions. Give your health care provider a list of all the medicines, herbs, non-prescription drugs, or dietary supplements you use. Also tell them if you smoke, drink alcohol, or use illegal drugs. Some items may interact with your medicine. What should I watch for while using this medication? Visit your care team for regular checks on your progress. Tell your care team if your symptoms do not start to get better or if they get worse. You may need blood work done while you are taking this medication. You may need to eat more foods that contain iron. Talk to your care team.  Foods that contain iron include whole grains or cereals, dried fruits, beans, peas, leafy green vegetables, and organ meats (liver, kidney). What side effects may I notice from receiving this medication? Side effects that you should report to your care team as soon as possible: Allergic reactions--skin rash, itching, hives, swelling of the face, lips, tongue, or throat Low blood pressure--dizziness, feeling faint or lightheaded, blurry  vision Shortness of breath Side effects that usually do not require medical attention (report to your care team if they continue or are bothersome): Flushing Headache Joint pain Muscle pain Nausea Pain, redness, or irritation at injection site This list may not describe all possible side effects. Call your doctor for medical advice about side effects. You may report side effects to FDA at 1-800-FDA-1088. Where should I keep my medication? This medication is given in a hospital or clinic. It will not be stored at home. NOTE: This sheet is a summary. It may not cover all possible information. If you have questions about this medicine, talk to your doctor, pharmacist, or health care provider.  2024 Elsevier/Gold Standard (2022-09-12 00:00:00)   To help prevent nausea and vomiting after your treatment, we encourage you to take your nausea medication as directed.  BELOW ARE SYMPTOMS THAT SHOULD BE REPORTED IMMEDIATELY: *FEVER GREATER THAN 100.4 F (38 C) OR HIGHER *CHILLS OR SWEATING *NAUSEA AND VOMITING THAT IS NOT CONTROLLED WITH YOUR NAUSEA MEDICATION *UNUSUAL SHORTNESS OF BREATH *UNUSUAL BRUISING OR BLEEDING *URINARY PROBLEMS (pain or burning when urinating, or frequent urination) *BOWEL PROBLEMS (unusual diarrhea, constipation, pain near the anus) TENDERNESS IN MOUTH AND THROAT WITH OR WITHOUT PRESENCE OF ULCERS (sore throat, sores in mouth, or a toothache) UNUSUAL RASH, SWELLING OR PAIN  UNUSUAL VAGINAL DISCHARGE OR ITCHING   Items with * indicate a potential emergency and should be followed up as soon as possible or go to the Emergency Department if any problems should occur.  Please show the CHEMOTHERAPY ALERT CARD or IMMUNOTHERAPY ALERT CARD at check-in to the Emergency Department and triage nurse.  Should you have questions after your visit or need to cancel or reschedule your appointment, please contact [facility] [place] - A DEPT OF [facility]  [phone]  and follow the prompts.  Office hours are 8:00 a.m. to 4:30 p.m. Monday - Friday. Please note that voicemails left after 4:00 p.m. may not be returned until the following business day.  We are closed weekends and major holidays. You have access to a nurse at all times for urgent questions. Please call the main number to the clinic [phone] and follow the prompts.  For any non-urgent questions, you may also contact your provider using MyChart. We now offer e-Visits for anyone 66 and older to request care online for non-urgent symptoms. For details visit [email or web address].   Also download the MyChart app! Go to the app store, search "MyChart", open the app, select [place], and log in with your MyChart username and password.

## 2023-10-22 ENCOUNTER — Inpatient Hospital Stay

## 2023-10-22 VITALS — BP 113/63 | HR 87 | Temp 98.3°F | Resp 18

## 2023-10-22 DIAGNOSIS — C50411 Malignant neoplasm of upper-outer quadrant of right female breast: Secondary | ICD-10-CM

## 2023-10-22 DIAGNOSIS — Z5112 Encounter for antineoplastic immunotherapy: Secondary | ICD-10-CM | POA: Diagnosis not present

## 2023-10-22 LAB — CBC WITH DIFFERENTIAL/PLATELET
Abs Immature Granulocytes: 0.06 K/uL (ref 0.00–0.07)
Basophils Absolute: 0.1 K/uL (ref 0.0–0.1)
Basophils Relative: 1 %
Eosinophils Absolute: 0.1 K/uL (ref 0.0–0.5)
Eosinophils Relative: 2 %
HCT: 32.1 % — ABNORMAL LOW (ref 36.0–46.0)
Hemoglobin: 9.9 g/dL — ABNORMAL LOW (ref 12.0–15.0)
Immature Granulocytes: 1 %
Lymphocytes Relative: 23 %
Lymphs Abs: 1.1 K/uL (ref 0.7–4.0)
MCH: 31.7 pg (ref 26.0–34.0)
MCHC: 30.8 g/dL (ref 30.0–36.0)
MCV: 102.9 fL — ABNORMAL HIGH (ref 80.0–100.0)
Monocytes Absolute: 0.4 K/uL (ref 0.1–1.0)
Monocytes Relative: 9 %
Neutro Abs: 2.9 K/uL (ref 1.7–7.7)
Neutrophils Relative %: 64 %
Platelets: 232 K/uL (ref 150–400)
RBC: 3.12 MIL/uL — ABNORMAL LOW (ref 3.87–5.11)
RDW: 17.1 % — ABNORMAL HIGH (ref 11.5–15.5)
WBC: 4.6 K/uL (ref 4.0–10.5)
nRBC: 0.4 % — ABNORMAL HIGH (ref 0.0–0.2)

## 2023-10-22 LAB — COMPREHENSIVE METABOLIC PANEL WITH GFR
ALT: 15 U/L (ref 0–44)
AST: 22 U/L (ref 15–41)
Albumin: 3.4 g/dL — ABNORMAL LOW (ref 3.5–5.0)
Alkaline Phosphatase: 166 U/L — ABNORMAL HIGH (ref 38–126)
Anion gap: 10 (ref 5–15)
BUN: 9 mg/dL (ref 6–20)
CO2: 23 mmol/L (ref 22–32)
Calcium: 8.4 mg/dL — ABNORMAL LOW (ref 8.9–10.3)
Chloride: 105 mmol/L (ref 98–111)
Creatinine, Ser: 0.65 mg/dL (ref 0.44–1.00)
GFR, Estimated: >60 mL/min (ref >60)
Glucose, Bld: 124 mg/dL — ABNORMAL HIGH (ref 70–99)
Potassium: 3.7 mmol/L (ref 3.5–5.1)
Sodium: 138 mmol/L (ref 135–145)
Total Bilirubin: 0.4 mg/dL (ref 0.0–1.2)
Total Protein: 6.5 g/dL (ref 6.5–8.1)

## 2023-10-22 LAB — PHOSPHORUS: Phosphorus: 3.6 mg/dL (ref 2.5–4.6)

## 2023-10-22 LAB — MAGNESIUM: Magnesium: 1.9 mg/dL (ref 1.7–2.4)

## 2023-10-22 MED ORDER — DEXAMETHASONE SODIUM PHOSPHATE 10 MG/ML IJ SOLN
10.0000 mg | Freq: Once | INTRAMUSCULAR | Status: AC
  Administered 2023-10-22: 10 mg via INTRAVENOUS
  Filled 2023-10-22: qty 1

## 2023-10-22 MED ORDER — SODIUM CHLORIDE 0.9 % IV SOLN
150.0000 mg | Freq: Once | INTRAVENOUS | Status: AC
  Administered 2023-10-22: 150 mg via INTRAVENOUS
  Filled 2023-10-22: qty 150

## 2023-10-22 MED ORDER — CETIRIZINE HCL 10 MG/ML IV SOLN
10.0000 mg | Freq: Once | INTRAVENOUS | Status: AC
  Administered 2023-10-22: 10 mg via INTRAVENOUS
  Filled 2023-10-22: qty 1

## 2023-10-22 MED ORDER — ACETAMINOPHEN 325 MG PO TABS
650.0000 mg | ORAL_TABLET | Freq: Once | ORAL | Status: AC
  Administered 2023-10-22: 650 mg via ORAL
  Filled 2023-10-22: qty 2

## 2023-10-22 MED ORDER — FAMOTIDINE IN NACL 20-0.9 MG/50ML-% IV SOLN
20.0000 mg | Freq: Once | INTRAVENOUS | Status: AC
  Administered 2023-10-22: 20 mg via INTRAVENOUS
  Filled 2023-10-22: qty 50

## 2023-10-22 MED ORDER — SODIUM CHLORIDE 0.9 % IV SOLN: INTRAVENOUS | Status: DC

## 2023-10-22 MED ORDER — PALONOSETRON HCL INJECTION 0.25 MG/5ML
0.2500 mg | Freq: Once | INTRAVENOUS | Status: AC
  Administered 2023-10-22: 0.25 mg via INTRAVENOUS
  Filled 2023-10-22: qty 5

## 2023-10-22 MED ORDER — ATROPINE SULFATE 1 MG/ML IV SOLN
0.5000 mg | Freq: Once | INTRAVENOUS | Status: AC
  Administered 2023-10-22: 0.5 mg via INTRAVENOUS
  Filled 2023-10-22: qty 1

## 2023-10-22 MED ORDER — SODIUM CHLORIDE 0.9 % IV SOLN
7.5000 mg/kg | Freq: Once | INTRAVENOUS | Status: AC
  Administered 2023-10-22: 540 mg via INTRAVENOUS
  Filled 2023-10-22: qty 54

## 2023-10-22 NOTE — Progress Notes (Signed)
Patient presents today for Trodelvy infusion per providers order.  Vital signs and labs within parameters for treatment.  Patient has no new complaints at this time.  Treatment given today per MD orders.  Stable during infusion without adverse affects.  Vital signs stable.  No complaints at this time.  Discharge from clinic ambulatory in stable condition.  Alert and oriented X 3.  Follow up with [facility] as scheduled.

## 2023-10-22 NOTE — Patient Instructions (Signed)
 [facility] [place] - A DEPT OF [place]  Discharge Instructions: Thank you for choosing [facility] to provide your oncology and hematology care.  If you have a lab appointment with the Cancer Center - please note that after April 8th, 2024, all labs will be drawn in the cancer center.  You do not have to check in or register with the main entrance as you have in the past but will complete your check-in in the cancer center.  Wear comfortable clothing and clothing appropriate for easy access to any Portacath or PICC line.   We strive to give you quality time with your provider. You may need to reschedule your appointment if you arrive late (15 or more minutes).  Arriving late affects you and other patients whose appointments are after yours.  Also, if you miss three or more appointments without notifying the office, you may be dismissed from the clinic at the provider's discretion.      For prescription refill requests, have your pharmacy contact our office and allow 72 hours for refills to be completed.    Today you received the following chemotherapy and/or immunotherapy agents Monica Soto   To help prevent nausea and vomiting after your treatment, we encourage you to take your nausea medication as directed.  BELOW ARE SYMPTOMS THAT SHOULD BE REPORTED IMMEDIATELY: *FEVER GREATER THAN 100.4 F (38 C) OR HIGHER *CHILLS OR SWEATING *NAUSEA AND VOMITING THAT IS NOT CONTROLLED WITH YOUR NAUSEA MEDICATION *UNUSUAL SHORTNESS OF BREATH *UNUSUAL BRUISING OR BLEEDING *URINARY PROBLEMS (pain or burning when urinating, or frequent urination) *BOWEL PROBLEMS (unusual diarrhea, constipation, pain near the anus) TENDERNESS IN MOUTH AND THROAT WITH OR WITHOUT PRESENCE OF ULCERS (sore throat, sores in mouth, or a toothache) UNUSUAL RASH, SWELLING OR PAIN  UNUSUAL VAGINAL DISCHARGE OR ITCHING   Items with * indicate a potential emergency and should be followed up as  soon as possible or go to the Emergency Department if any problems should occur.  Please show the CHEMOTHERAPY ALERT CARD or IMMUNOTHERAPY ALERT CARD at check-in to the Emergency Department and triage nurse.  Should you have questions after your visit or need to cancel or reschedule your appointment, please contact [facility] [place] - A DEPT OF [facility] [phone]  and follow the prompts.  Office hours are 8:00 a.m. to 4:30 p.m. Monday - Friday. Please note that voicemails left after 4:00 p.m. may not be returned until the following business day.  We are closed weekends and major holidays. You have access to a nurse at all times for urgent questions. Please call the main number to the clinic [phone] and follow the prompts.  For any non-urgent questions, you may also contact your provider using MyChart. We now offer e-Visits for anyone 15 and older to request care online for non-urgent symptoms. For details visit [email or web address].   Also download the MyChart app! Go to the app store, search "MyChart", open the app, select [place], and log in with your MyChart username and password.

## 2023-10-23 ENCOUNTER — Inpatient Hospital Stay

## 2023-10-23 VITALS — BP 140/72 | HR 74 | Temp 98.5°F | Resp 18

## 2023-10-23 DIAGNOSIS — Z5112 Encounter for antineoplastic immunotherapy: Secondary | ICD-10-CM | POA: Diagnosis not present

## 2023-10-23 DIAGNOSIS — Z170 Estrogen receptor positive status [ER+]: Secondary | ICD-10-CM

## 2023-10-23 LAB — CANCER ANTIGEN 15-3: CA 15-3: 296 U/mL — ABNORMAL HIGH (ref 0.0–25.0)

## 2023-10-23 LAB — CANCER ANTIGEN 27.29: CA 27.29: 431.4 U/mL — ABNORMAL HIGH (ref 0.0–38.6)

## 2023-10-23 MED ORDER — PEGFILGRASTIM-CBQV 6 MG/0.6ML ~~LOC~~ SOSY
6.0000 mg | PREFILLED_SYRINGE | Freq: Once | SUBCUTANEOUS | Status: AC
  Administered 2023-10-23: 6 mg via SUBCUTANEOUS
  Filled 2023-10-23: qty 0.6

## 2023-10-23 NOTE — Patient Instructions (Signed)
 [facility] [place] - A DEPT OF [place]  Discharge Instructions: Thank you for choosing [facility] to provide your oncology and hematology care.  If you have a lab appointment with the Cancer Center - please note that after April 8th, 2024, all labs will be drawn in the cancer center.  You do not have to check in or register with the main entrance as you have in the past but will complete your check-in in the cancer center.  Wear comfortable clothing and clothing appropriate for easy access to any Portacath or PICC line.   We strive to give you quality time with your provider. You may need to reschedule your appointment if you arrive late (15 or more minutes).  Arriving late affects you and other patients whose appointments are after yours.  Also, if you miss three or more appointments without notifying the office, you may be dismissed from the clinic at the provider's discretion.      For prescription refill requests, have your pharmacy contact our office and allow 72 hours for refills to be completed.    Today you received the following pegrilgrastim, return as scheduled.   To help prevent nausea and vomiting after your treatment, we encourage you to take your nausea medication as directed.  BELOW ARE SYMPTOMS THAT SHOULD BE REPORTED IMMEDIATELY: *FEVER GREATER THAN 100.4 F (38 C) OR HIGHER *CHILLS OR SWEATING *NAUSEA AND VOMITING THAT IS NOT CONTROLLED WITH YOUR NAUSEA MEDICATION *UNUSUAL SHORTNESS OF BREATH *UNUSUAL BRUISING OR BLEEDING *URINARY PROBLEMS (pain or burning when urinating, or frequent urination) *BOWEL PROBLEMS (unusual diarrhea, constipation, pain near the anus) TENDERNESS IN MOUTH AND THROAT WITH OR WITHOUT PRESENCE OF ULCERS (sore throat, sores in mouth, or a toothache) UNUSUAL RASH, SWELLING OR PAIN  UNUSUAL VAGINAL DISCHARGE OR ITCHING   Items with * indicate a potential emergency and should be followed up as soon as  possible or go to the Emergency Department if any problems should occur.  Please show the CHEMOTHERAPY ALERT CARD or IMMUNOTHERAPY ALERT CARD at check-in to the Emergency Department and triage nurse.  Should you have questions after your visit or need to cancel or reschedule your appointment, please contact [facility] [place] - A DEPT OF [facility] [phone]  and follow the prompts.  Office hours are 8:00 a.m. to 4:30 p.m. Monday - Friday. Please note that voicemails left after 4:00 p.m. may not be returned until the following business day.  We are closed weekends and major holidays. You have access to a nurse at all times for urgent questions. Please call the main number to the clinic [phone] and follow the prompts.  For any non-urgent questions, you may also contact your provider using MyChart. We now offer e-Visits for anyone 51 and older to request care online for non-urgent symptoms. For details visit [email or web address].   Also download the MyChart app! Go to the app store, search MyChart, open the app, select [place], and log in with your MyChart username and password.

## 2023-10-23 NOTE — Progress Notes (Signed)
 Patient tolerated injection with no complaints voiced. Site clean and dry with no bruising or swelling noted at site. See MAR for details. Band aid applied.  Patient stable during and after injection. VSS with discharge and left in satisfactory condition with no s/s of distress noted.

## 2023-11-05 ENCOUNTER — Inpatient Hospital Stay

## 2023-11-05 ENCOUNTER — Encounter: Payer: Self-pay | Admitting: Oncology

## 2023-11-05 VITALS — BP 102/63 | HR 94 | Temp 96.7°F | Resp 18

## 2023-11-05 DIAGNOSIS — Z5112 Encounter for antineoplastic immunotherapy: Secondary | ICD-10-CM | POA: Diagnosis not present

## 2023-11-05 DIAGNOSIS — Z170 Estrogen receptor positive status [ER+]: Secondary | ICD-10-CM

## 2023-11-05 LAB — COMPREHENSIVE METABOLIC PANEL WITH GFR
ALT: 15 U/L (ref 0–44)
AST: 18 U/L (ref 15–41)
Albumin: 3.3 g/dL — ABNORMAL LOW (ref 3.5–5.0)
Alkaline Phosphatase: 200 U/L — ABNORMAL HIGH (ref 38–126)
Anion gap: 13 (ref 5–15)
BUN: 12 mg/dL (ref 6–20)
CO2: 20 mmol/L — ABNORMAL LOW (ref 22–32)
Calcium: 8.4 mg/dL — ABNORMAL LOW (ref 8.9–10.3)
Chloride: 105 mmol/L (ref 98–111)
Creatinine, Ser: 0.59 mg/dL (ref 0.44–1.00)
GFR, Estimated: >60 mL/min (ref >60)
Glucose, Bld: 121 mg/dL — ABNORMAL HIGH (ref 70–99)
Potassium: 3.5 mmol/L (ref 3.5–5.1)
Sodium: 138 mmol/L (ref 135–145)
Total Bilirubin: 0.7 mg/dL (ref 0.0–1.2)
Total Protein: 6.9 g/dL (ref 6.5–8.1)

## 2023-11-05 LAB — CBC WITH DIFFERENTIAL/PLATELET
Abs Immature Granulocytes: 0.11 K/uL — ABNORMAL HIGH (ref 0.00–0.07)
Basophils Absolute: 0.1 K/uL (ref 0.0–0.1)
Basophils Relative: 1 %
Eosinophils Absolute: 0.1 K/uL (ref 0.0–0.5)
Eosinophils Relative: 1 %
HCT: 33.4 % — ABNORMAL LOW (ref 36.0–46.0)
Hemoglobin: 10.4 g/dL — ABNORMAL LOW (ref 12.0–15.0)
Immature Granulocytes: 1 %
Lymphocytes Relative: 12 %
Lymphs Abs: 1.3 K/uL (ref 0.7–4.0)
MCH: 31.6 pg (ref 26.0–34.0)
MCHC: 31.1 g/dL (ref 30.0–36.0)
MCV: 101.5 fL — ABNORMAL HIGH (ref 80.0–100.0)
Monocytes Absolute: 0.7 K/uL (ref 0.1–1.0)
Monocytes Relative: 6 %
Neutro Abs: 8.9 K/uL — ABNORMAL HIGH (ref 1.7–7.7)
Neutrophils Relative %: 79 %
Platelets: 174 K/uL (ref 150–400)
RBC: 3.29 MIL/uL — ABNORMAL LOW (ref 3.87–5.11)
RDW: 16.8 % — ABNORMAL HIGH (ref 11.5–15.5)
WBC: 11.1 K/uL — ABNORMAL HIGH (ref 4.0–10.5)
nRBC: 0 % (ref 0.0–0.2)

## 2023-11-05 LAB — PHOSPHORUS: Phosphorus: 3.2 mg/dL (ref 2.5–4.6)

## 2023-11-05 LAB — MAGNESIUM: Magnesium: 1.7 mg/dL (ref 1.7–2.4)

## 2023-11-05 MED ORDER — SODIUM CHLORIDE 0.9 % IV SOLN
7.5000 mg/kg | Freq: Once | INTRAVENOUS | Status: AC
  Administered 2023-11-05: 540 mg via INTRAVENOUS
  Filled 2023-11-05: qty 54

## 2023-11-05 MED ORDER — ATROPINE SULFATE 1 MG/ML IV SOLN
0.5000 mg | Freq: Once | INTRAVENOUS | Status: AC
  Administered 2023-11-05: 0.5 mg via INTRAVENOUS
  Filled 2023-11-05: qty 1

## 2023-11-05 MED ORDER — ACETAMINOPHEN 325 MG PO TABS
650.0000 mg | ORAL_TABLET | Freq: Once | ORAL | Status: AC
  Administered 2023-11-05: 650 mg via ORAL
  Filled 2023-11-05: qty 2

## 2023-11-05 MED ORDER — FAMOTIDINE IN NACL 20-0.9 MG/50ML-% IV SOLN
20.0000 mg | Freq: Once | INTRAVENOUS | Status: AC
  Administered 2023-11-05: 20 mg via INTRAVENOUS
  Filled 2023-11-05: qty 50

## 2023-11-05 MED ORDER — SODIUM CHLORIDE 0.9 % IV SOLN
150.0000 mg | Freq: Once | INTRAVENOUS | Status: AC
  Administered 2023-11-05: 150 mg via INTRAVENOUS
  Filled 2023-11-05: qty 150

## 2023-11-05 MED ORDER — DEXAMETHASONE SODIUM PHOSPHATE 10 MG/ML IJ SOLN
10.0000 mg | Freq: Once | INTRAMUSCULAR | Status: AC
  Administered 2023-11-05: 10 mg via INTRAVENOUS
  Filled 2023-11-05: qty 1

## 2023-11-05 MED ORDER — PALONOSETRON HCL INJECTION 0.25 MG/5ML
0.2500 mg | Freq: Once | INTRAVENOUS | Status: AC
  Administered 2023-11-05: 0.25 mg via INTRAVENOUS
  Filled 2023-11-05: qty 5

## 2023-11-05 MED ORDER — CETIRIZINE HCL 10 MG/ML IV SOLN
10.0000 mg | Freq: Once | INTRAVENOUS | Status: AC
  Administered 2023-11-05: 10 mg via INTRAVENOUS
  Filled 2023-11-05: qty 1

## 2023-11-05 MED ORDER — SODIUM CHLORIDE 0.9 % IV SOLN: INTRAVENOUS | Status: DC

## 2023-11-05 NOTE — Patient Instructions (Signed)
 [facility] [place] - A DEPT OF [facility]  Discharge Instructions: Thank you for choosing [facility] to provide your oncology and hematology care.  If you have a lab appointment with the Cancer Center - please note that after April 8th, 2024, all labs will be drawn in the cancer center.  You do not have to check in or register with the main entrance as you have in the past but will complete your check-in in the cancer center.  Wear comfortable clothing and clothing appropriate for easy access to any Portacath or PICC line.   We strive to give you quality time with your provider. You may need to reschedule your appointment if you arrive late (15 or more minutes).  Arriving late affects you and other patients whose appointments are after yours.  Also, if you miss three or more appointments without notifying the office, you may be dismissed from the clinic at the provider's discretion.      For prescription refill requests, have your pharmacy contact our office and allow 72 hours for refills to be completed.    Today you received the following chemotherapy and/or immunotherapy agents Trodelvy    To help prevent nausea and vomiting after your treatment, we encourage you to take your nausea medication as directed.   Sacituzumab Govitecan  Injection What is this medication? SACITUZUMAB GOVITECAN  (SAK i TOOZ ue mab GOE vi TEE can) treats breast cancer. It works by blocking a protein that causes cancer cells to grow and multiply. This helps to slow or stop the spread of cancer cells. This medicine may be used for other purposes; ask your health care provider or pharmacist if you have questions. COMMON BRAND NAME(S): TRODELVY  What should I tell my care team before I take this medication? They need to know if you have any of these conditions: Carry the UGT1A1*28 gene Infection Liver disease An unusual or allergic reaction to sacituzumab govitecan , other  medications, foods, dyes, or preservatives Pregnant or trying to get pregnant Breast-feeding How should I use this medication? This medication is injected into a vein. It is given by your care team in a hospital or clinic setting. Talk to your care team about the use of this medication in children. Special care may be needed. Overdosage: If you think you have taken too much of this medicine contact a poison control center or emergency room at once. NOTE: This medicine is only for you. Do not share this medicine with others. What if I miss a dose? Keep appointments for follow-up doses. It is important not to miss your dose. Call your care team if you are unable to keep an appointment. What may interact with this medication? This medication may affect how other medications work, and other medications may affect the way this medication works. Talk with your care team about all of the medications you take. They may suggest changes to your treatment plan to lower the risk of side effects and to make sure your medications work as intended. This list may not describe all possible interactions. Give your health care provider a list of all the medicines, herbs, non-prescription drugs, or dietary supplements you use. Also tell them if you smoke, drink alcohol, or use illegal drugs. Some items may interact with your medicine. What should I watch for while using this medication? This medication may make you feel generally unwell. This is not uncommon as chemotherapy can affect healthy cells as well as cancer cells. Report  any side effects. Continue your course of treatment even though you feel ill unless your care team tells you to stop. You may need blood work while you are taking this medication. Certain genetic factors may decrease the safety of this medication. Your care team may use genetic tests to determine treatment. This medication can cause serious allergic reactions. To reduce your risk, your care  team may give you other medications to take before receiving this one. Be sure to follow the directions from your care team. Check with your care team if you have severe diarrhea, nausea, and vomiting, or if you sweat a lot. The loss of too much body fluid may make it dangerous for you to take this medication. Talk to your care team if you wish to become pregnant or think you might be pregnant. This medication can cause serious birth defects if taken during pregnancy or if you get pregnant within 6 months after stopping treatment. A negative pregnancy test is required before starting this medication. A reliable form of contraception is recommended while taking this medication and for 6 months after stopping treatment. Talk to your care team about reliable forms of contraception. Use a condom during sex and for 3 months after stopping treatment. Tell your care team right away if you think your partner might be pregnant. This medication can cause serious birth defects. Do not breast-feed while taking this medication and for 1 month after stopping therapy. This medication may cause infertility. Talk to your care team if you are concerned about your fertility. This medication may increase your risk of getting an infection. Call your care team for advice if you get a fever, chills, sore throat, or other symptoms of a cold or flu. Do not treat yourself. Try to avoid being around people who are sick. Avoid taking medications that contain aspirin, acetaminophen , ibuprofen, naproxen, or ketoprofen unless instructed by your care team. These medications may hide a fever. This medication may increase blood sugar. The risk may be higher in patients who already have diabetes. Ask your care team what you can do to lower your risk of diabetes while taking this medication. What side effects may I notice from receiving this medication? Side effects that you should report to your care team as soon as possible: Allergic  reactions--skin rash, itching, hives, swelling of the face, lips, tongue, or throat Infection--fever, chills, cough, or sore throat Infusion reactions--chest pain, shortness of breath or trouble breathing, feeling faint or lightheaded Low red blood cell level--unusual weakness or fatigue, dizziness, headache, trouble breathing Severe or prolonged diarrhea Side effects that usually do not require medical attention (report these to your care team if they continue or are bothersome): Constipation Diarrhea Fatigue Hair loss Loss of appetite Nausea Vomiting This list may not describe all possible side effects. Call your doctor for medical advice about side effects. You may report side effects to FDA at 1-800-FDA-1088. Where should I keep my medication? This medication is given in a hospital or clinic. It will not be stored at home. NOTE: This sheet is a summary. It may not cover all possible information. If you have questions about this medicine, talk to your doctor, pharmacist, or health care provider.  2025 Elsevier/Gold Standard (2023-01-07 00:00:00)  BELOW ARE SYMPTOMS THAT SHOULD BE REPORTED IMMEDIATELY: *FEVER GREATER THAN 100.4 F (38 C) OR HIGHER *CHILLS OR SWEATING *NAUSEA AND VOMITING THAT IS NOT CONTROLLED WITH YOUR NAUSEA MEDICATION *UNUSUAL SHORTNESS OF BREATH *UNUSUAL BRUISING OR BLEEDING *URINARY PROBLEMS (pain or  burning when urinating, or frequent urination) *BOWEL PROBLEMS (unusual diarrhea, constipation, pain near the anus) TENDERNESS IN MOUTH AND THROAT WITH OR WITHOUT PRESENCE OF ULCERS (sore throat, sores in mouth, or a toothache) UNUSUAL RASH, SWELLING OR PAIN  UNUSUAL VAGINAL DISCHARGE OR ITCHING   Items with * indicate a potential emergency and should be followed up as soon as possible or go to the Emergency Department if any problems should occur.  Please show the CHEMOTHERAPY ALERT CARD or IMMUNOTHERAPY ALERT CARD at check-in to the Emergency Department and  triage nurse.  Should you have questions after your visit or need to cancel or reschedule your appointment, please contact [facility] [place] - A DEPT OF [facility] [phone]  and follow the prompts.  Office hours are 8:00 a.m. to 4:30 p.m. Monday - Friday. Please note that voicemails left after 4:00 p.m. may not be returned until the following business day.  We are closed weekends and major holidays. You have access to a nurse at all times for urgent questions. Please call the main number to the clinic [phone] and follow the prompts.  For any non-urgent questions, you may also contact your provider using MyChart. We now offer e-Visits for anyone 14 and older to request care online for non-urgent symptoms. For details visit [email or web address].   Also download the MyChart app! Go to the app store, search MyChart, open the app, select [place], and log in with your MyChart username and password.

## 2023-11-05 NOTE — Progress Notes (Signed)
 Patient presents today for Trodelvy  infusion.  Patient is in satisfactory condition with no new complaints voiced. All other vital signs are stable. Patient's HR noted to be 112 today. Labs reviewed and all labs are within treatment parameters.  We will proceed with treatment per MD orders.    Treatment given today per MD orders. Tolerated infusion without adverse affects. Vital signs stable. No complaints at this time. Discharged from clinic ambulatory in stable condition. Alert and oriented x 3. F/U with [facility] as scheduled.

## 2023-11-12 ENCOUNTER — Inpatient Hospital Stay: Attending: Hematology and Oncology

## 2023-11-12 ENCOUNTER — Inpatient Hospital Stay

## 2023-11-12 VITALS — BP 109/68 | HR 74 | Temp 98.0°F | Resp 18

## 2023-11-12 DIAGNOSIS — D6481 Anemia due to antineoplastic chemotherapy: Secondary | ICD-10-CM | POA: Diagnosis not present

## 2023-11-12 DIAGNOSIS — Z170 Estrogen receptor positive status [ER+]: Secondary | ICD-10-CM | POA: Diagnosis not present

## 2023-11-12 DIAGNOSIS — D649 Anemia, unspecified: Secondary | ICD-10-CM | POA: Diagnosis not present

## 2023-11-12 DIAGNOSIS — C50411 Malignant neoplasm of upper-outer quadrant of right female breast: Secondary | ICD-10-CM | POA: Diagnosis present

## 2023-11-12 DIAGNOSIS — L299 Pruritus, unspecified: Secondary | ICD-10-CM | POA: Insufficient documentation

## 2023-11-12 DIAGNOSIS — Z7983 Long term (current) use of bisphosphonates: Secondary | ICD-10-CM | POA: Diagnosis not present

## 2023-11-12 DIAGNOSIS — Z1722 Progesterone receptor negative status: Secondary | ICD-10-CM | POA: Diagnosis not present

## 2023-11-12 DIAGNOSIS — Z1732 Human epidermal growth factor receptor 2 negative status: Secondary | ICD-10-CM | POA: Insufficient documentation

## 2023-11-12 DIAGNOSIS — Z79899 Other long term (current) drug therapy: Secondary | ICD-10-CM | POA: Insufficient documentation

## 2023-11-12 DIAGNOSIS — R234 Changes in skin texture: Secondary | ICD-10-CM | POA: Insufficient documentation

## 2023-11-12 DIAGNOSIS — I2510 Atherosclerotic heart disease of native coronary artery without angina pectoris: Secondary | ICD-10-CM | POA: Insufficient documentation

## 2023-11-12 DIAGNOSIS — L814 Other melanin hyperpigmentation: Secondary | ICD-10-CM | POA: Insufficient documentation

## 2023-11-12 DIAGNOSIS — M25519 Pain in unspecified shoulder: Secondary | ICD-10-CM | POA: Insufficient documentation

## 2023-11-12 DIAGNOSIS — Z5112 Encounter for antineoplastic immunotherapy: Secondary | ICD-10-CM | POA: Diagnosis present

## 2023-11-12 DIAGNOSIS — R059 Cough, unspecified: Secondary | ICD-10-CM | POA: Diagnosis not present

## 2023-11-12 DIAGNOSIS — N6320 Unspecified lump in the left breast, unspecified quadrant: Secondary | ICD-10-CM | POA: Diagnosis not present

## 2023-11-12 DIAGNOSIS — G893 Neoplasm related pain (acute) (chronic): Secondary | ICD-10-CM | POA: Insufficient documentation

## 2023-11-12 DIAGNOSIS — T451X5A Adverse effect of antineoplastic and immunosuppressive drugs, initial encounter: Secondary | ICD-10-CM | POA: Insufficient documentation

## 2023-11-12 DIAGNOSIS — C773 Secondary and unspecified malignant neoplasm of axilla and upper limb lymph nodes: Secondary | ICD-10-CM | POA: Insufficient documentation

## 2023-11-12 DIAGNOSIS — C7951 Secondary malignant neoplasm of bone: Secondary | ICD-10-CM | POA: Diagnosis not present

## 2023-11-12 DIAGNOSIS — R197 Diarrhea, unspecified: Secondary | ICD-10-CM | POA: Diagnosis not present

## 2023-11-12 DIAGNOSIS — C787 Secondary malignant neoplasm of liver and intrahepatic bile duct: Secondary | ICD-10-CM | POA: Diagnosis not present

## 2023-11-12 DIAGNOSIS — G629 Polyneuropathy, unspecified: Secondary | ICD-10-CM | POA: Diagnosis not present

## 2023-11-12 DIAGNOSIS — Z5189 Encounter for other specified aftercare: Secondary | ICD-10-CM | POA: Diagnosis not present

## 2023-11-12 DIAGNOSIS — I700 Atherosclerosis of aorta: Secondary | ICD-10-CM | POA: Diagnosis not present

## 2023-11-12 LAB — COMPREHENSIVE METABOLIC PANEL WITH GFR
ALT: 15 U/L (ref 0–44)
AST: 22 U/L (ref 15–41)
Albumin: 3.8 g/dL (ref 3.5–5.0)
Alkaline Phosphatase: 205 U/L — ABNORMAL HIGH (ref 38–126)
Anion gap: 15 (ref 5–15)
BUN: 8 mg/dL (ref 6–20)
CO2: 21 mmol/L — ABNORMAL LOW (ref 22–32)
Calcium: 9.2 mg/dL (ref 8.9–10.3)
Chloride: 105 mmol/L (ref 98–111)
Creatinine, Ser: 0.59 mg/dL (ref 0.44–1.00)
GFR, Estimated: >60 mL/min (ref >60)
Glucose, Bld: 120 mg/dL — ABNORMAL HIGH (ref 70–99)
Potassium: 3.6 mmol/L (ref 3.5–5.1)
Sodium: 141 mmol/L (ref 135–145)
Total Bilirubin: <0.2 mg/dL (ref 0.0–1.2)
Total Protein: 6.3 g/dL — ABNORMAL LOW (ref 6.5–8.1)

## 2023-11-12 LAB — CBC WITH DIFFERENTIAL/PLATELET
Abs Immature Granulocytes: 0.04 K/uL (ref 0.00–0.07)
Basophils Absolute: 0 K/uL (ref 0.0–0.1)
Basophils Relative: 1 %
Eosinophils Absolute: 0.1 K/uL (ref 0.0–0.5)
Eosinophils Relative: 2 %
HCT: 33.5 % — ABNORMAL LOW (ref 36.0–46.0)
Hemoglobin: 10.2 g/dL — ABNORMAL LOW (ref 12.0–15.0)
Immature Granulocytes: 1 %
Lymphocytes Relative: 25 %
Lymphs Abs: 1 K/uL (ref 0.7–4.0)
MCH: 30.7 pg (ref 26.0–34.0)
MCHC: 30.4 g/dL (ref 30.0–36.0)
MCV: 100.9 fL — ABNORMAL HIGH (ref 80.0–100.0)
Monocytes Absolute: 0.4 K/uL (ref 0.1–1.0)
Monocytes Relative: 8 %
Neutro Abs: 2.6 K/uL (ref 1.7–7.7)
Neutrophils Relative %: 63 %
Platelets: 243 K/uL (ref 150–400)
RBC: 3.32 MIL/uL — ABNORMAL LOW (ref 3.87–5.11)
RDW: 16.1 % — ABNORMAL HIGH (ref 11.5–15.5)
WBC: 4.2 K/uL (ref 4.0–10.5)
nRBC: 0 % (ref 0.0–0.2)

## 2023-11-12 LAB — MAGNESIUM: Magnesium: 1.6 mg/dL — ABNORMAL LOW (ref 1.7–2.4)

## 2023-11-12 LAB — PHOSPHORUS: Phosphorus: 3.8 mg/dL (ref 2.5–4.6)

## 2023-11-12 MED ORDER — ATROPINE SULFATE 1 MG/ML IV SOLN
0.5000 mg | Freq: Once | INTRAVENOUS | Status: AC
  Administered 2023-11-12: 0.5 mg via INTRAVENOUS
  Filled 2023-11-12: qty 1

## 2023-11-12 MED ORDER — ACETAMINOPHEN 325 MG PO TABS
650.0000 mg | ORAL_TABLET | Freq: Once | ORAL | Status: AC
  Administered 2023-11-12: 650 mg via ORAL
  Filled 2023-11-12: qty 2

## 2023-11-12 MED ORDER — SODIUM CHLORIDE 0.9 % IV SOLN
7.5000 mg/kg | Freq: Once | INTRAVENOUS | Status: AC
  Administered 2023-11-12: 540 mg via INTRAVENOUS
  Filled 2023-11-12: qty 54

## 2023-11-12 MED ORDER — SODIUM CHLORIDE 0.9 % IV SOLN
150.0000 mg | Freq: Once | INTRAVENOUS | Status: AC
  Administered 2023-11-12: 150 mg via INTRAVENOUS
  Filled 2023-11-12: qty 150

## 2023-11-12 MED ORDER — DEXAMETHASONE SODIUM PHOSPHATE 10 MG/ML IJ SOLN
10.0000 mg | Freq: Once | INTRAMUSCULAR | Status: AC
  Administered 2023-11-12: 10 mg via INTRAVENOUS
  Filled 2023-11-12: qty 1

## 2023-11-12 MED ORDER — PALONOSETRON HCL INJECTION 0.25 MG/5ML
0.2500 mg | Freq: Once | INTRAVENOUS | Status: AC
  Administered 2023-11-12: 0.25 mg via INTRAVENOUS
  Filled 2023-11-12: qty 5

## 2023-11-12 MED ORDER — MAGNESIUM SULFATE 2 GM/50ML IV SOLN
2.0000 g | Freq: Once | INTRAVENOUS | Status: AC
  Administered 2023-11-12: 2 g via INTRAVENOUS
  Filled 2023-11-12: qty 50

## 2023-11-12 MED ORDER — CETIRIZINE HCL 10 MG/ML IV SOLN
10.0000 mg | Freq: Once | INTRAVENOUS | Status: AC
  Administered 2023-11-12: 10 mg via INTRAVENOUS
  Filled 2023-11-12: qty 1

## 2023-11-12 MED ORDER — FAMOTIDINE IN NACL 20-0.9 MG/50ML-% IV SOLN
20.0000 mg | Freq: Once | INTRAVENOUS | Status: AC
  Administered 2023-11-12: 20 mg via INTRAVENOUS
  Filled 2023-11-12: qty 50

## 2023-11-12 MED ORDER — SODIUM CHLORIDE 0.9 % IV SOLN: INTRAVENOUS | Status: DC

## 2023-11-12 NOTE — Patient Instructions (Signed)
 [facility] [place] - A DEPT OF [place]  Discharge Instructions: Thank you for choosing [facility] to provide your oncology and hematology care.  If you have a lab appointment with the Cancer Center - please note that after April 8th, 2024, all labs will be drawn in the cancer center.  You do not have to check in or register with the main entrance as you have in the past but will complete your check-in in the cancer center.  Wear comfortable clothing and clothing appropriate for easy access to any Portacath or PICC line.   We strive to give you quality time with your provider. You may need to reschedule your appointment if you arrive late (15 or more minutes).  Arriving late affects you and other patients whose appointments are after yours.  Also, if you miss three or more appointments without notifying the office, you may be dismissed from the clinic at the provider's discretion.      For prescription refill requests, have your pharmacy contact our office and allow 72 hours for refills to be completed.    Today you received the following chemotherapy and/or immunotherapy agents Trodelvy /Magnesium       To help prevent nausea and vomiting after your treatment, we encourage you to take your nausea medication as directed.  BELOW ARE SYMPTOMS THAT SHOULD BE REPORTED IMMEDIATELY: *FEVER GREATER THAN 100.4 F (38 C) OR HIGHER *CHILLS OR SWEATING *NAUSEA AND VOMITING THAT IS NOT CONTROLLED WITH YOUR NAUSEA MEDICATION *UNUSUAL SHORTNESS OF BREATH *UNUSUAL BRUISING OR BLEEDING *URINARY PROBLEMS (pain or burning when urinating, or frequent urination) *BOWEL PROBLEMS (unusual diarrhea, constipation, pain near the anus) TENDERNESS IN MOUTH AND THROAT WITH OR WITHOUT PRESENCE OF ULCERS (sore throat, sores in mouth, or a toothache) UNUSUAL RASH, SWELLING OR PAIN  UNUSUAL VAGINAL DISCHARGE OR ITCHING   Items with * indicate a potential emergency and should be  followed up as soon as possible or go to the Emergency Department if any problems should occur.  Please show the CHEMOTHERAPY ALERT CARD or IMMUNOTHERAPY ALERT CARD at check-in to the Emergency Department and triage nurse.  Should you have questions after your visit or need to cancel or reschedule your appointment, please contact [facility]CER CTR [place] - A DEPT OF [facility] [phone]  and follow the prompts.  Office hours are 8:00 a.m. to 4:30 p.m. Monday - Friday. Please note that voicemails left after 4:00 p.m. may not be returned until the following business day.  We are closed weekends and major holidays. You have access to a nurse at all times for urgent questions. Please call the main number to the clinic [phone] and follow the prompts.  For any non-urgent questions, you may also contact your provider using MyChart. We now offer e-Visits for anyone 65 and older to request care online for non-urgent symptoms. For details visit [email or web address].   Also download the MyChart app! Go to the app store, search MyChart, open the app, select [place], and log in with your MyChart username and password.

## 2023-11-12 NOTE — Progress Notes (Signed)
 Patient presents today for Trodelvy  infusion per providers orders.  Vital signs and labs within parameters for treatment.  Patient will receive 2 grams IV Magnesium  per MD standing order for Mag level of 1.6.  Treatment given today per MD orders.  Stable during infusion without adverse affects.  Vital signs stable.  No complaints at this time.  Discharge from clinic ambulatory in stable condition.  Alert and oriented X 3.  Follow up with [facility] as scheduled.

## 2023-11-12 NOTE — Progress Notes (Signed)
   11/12/23 1458  Spiritual Encounters  Type of Visit Follow up  Care provided to: Patient  Referral source Chaplain assessment  Reason for visit Routine spiritual support  Spiritual Framework  Presenting Themes Goals in life/care;Caregiving needs;Coping tools  Community/Connection Family;Friend(s)  Patient Stress Factors Exhausted  Interventions  Spiritual Care Interventions Made Compassionate presence;Reflective listening;Explored values/beliefs/practices/strengths;Narrative/life review;Encouragement  Intervention Outcomes  Outcomes Awareness around self/spiritual resourses;Awareness of support   Reason for Visit: Chaplain making scheduled follow-up with Pt I previously connected with. Description of Visit: I entered the room and found Chamia sitting in the chair receiving her treatment, with no support person present.   Early in the conversation Charlene mentioned that she is "I'm kind of sick of this cancer- I'm over it and ready for it to be over."  She spoke in a playful tone, but there was still the underlying tone that she is presently weary with the regiment of treatment, the fatigue, and even sick of being treated like a Pt by her friends and family.  She told a story of being frustrated with her husbands attempts to keep her from falling at a recent wedding they went to.  She resists be looked at as frail or being pitied.  She did speak with some excitement about an upcoming week where her husband will be away on business and she and her daughter will be hanging out together and "eating girl food."  This seems to be a good supportive relationship for [place].  Avaleigh has a PET scan coming up next week and is hopeful that the results will be good, but she also harbors a concern that they won't be good.  She states that she knows her body and it doesn't feel right.  Olina also spoke of some anxiety on her part that stems from adjusting to Dr. Davonna after the exit of Dr. MARLA.  She remarks that  she is still learning how to read Dr. Davonna and understand what is or isn't being said "between the lines."  She is hopeful that her relationship with Dr. Davonna can be as good as her relationship with Dr. MARLA.  Plan of Care: I will continue to follow [place] on a bi-weekly basis.   Maude Roll, MDiv  Chaplain, [facility] [email or web address] [phone]

## 2023-11-13 ENCOUNTER — Inpatient Hospital Stay

## 2023-11-13 VITALS — BP 140/82 | HR 80 | Temp 98.3°F | Resp 18

## 2023-11-13 DIAGNOSIS — Z170 Estrogen receptor positive status [ER+]: Secondary | ICD-10-CM

## 2023-11-13 DIAGNOSIS — Z5112 Encounter for antineoplastic immunotherapy: Secondary | ICD-10-CM | POA: Diagnosis not present

## 2023-11-13 LAB — CANCER ANTIGEN 15-3: CA 15-3: 325 U/mL — ABNORMAL HIGH (ref 0.0–25.0)

## 2023-11-13 LAB — CANCER ANTIGEN 27.29: CA 27.29: 421.3 U/mL — ABNORMAL HIGH (ref 0.0–38.6)

## 2023-11-13 MED ORDER — PEGFILGRASTIM-CBQV 6 MG/0.6ML ~~LOC~~ SOSY
6.0000 mg | PREFILLED_SYRINGE | Freq: Once | SUBCUTANEOUS | Status: AC
  Administered 2023-11-13: 6 mg via SUBCUTANEOUS
  Filled 2023-11-13: qty 0.6

## 2023-11-13 NOTE — Progress Notes (Signed)
 Patient tolerated Udenyca injection with no complaints voiced.  Site clean and dry with no bruising or swelling noted at site.  See MAR for details.  Band aid applied.  Patient stable during and after injection.  Vss with discharge and left in satisfactory condition with no s/s of distress noted. All follow ups as scheduled.   Monica Soto [employer]

## 2023-11-13 NOTE — Patient Instructions (Signed)

## 2023-11-19 ENCOUNTER — Other Ambulatory Visit: Payer: Self-pay | Admitting: Oncology

## 2023-11-19 DIAGNOSIS — C50411 Malignant neoplasm of upper-outer quadrant of right female breast: Secondary | ICD-10-CM

## 2023-11-20 ENCOUNTER — Other Ambulatory Visit: Payer: Self-pay

## 2023-11-21 ENCOUNTER — Ambulatory Visit (HOSPITAL_COMMUNITY)
Admission: RE | Admit: 2023-11-21 | Discharge: 2023-11-21 | Disposition: A | Discharge status: Home / Self Care | Source: Ambulatory Visit | Attending: Oncology | Admitting: Oncology

## 2023-11-21 DIAGNOSIS — Z170 Estrogen receptor positive status [ER+]: Secondary | ICD-10-CM | POA: Diagnosis present

## 2023-11-21 DIAGNOSIS — C50411 Malignant neoplasm of upper-outer quadrant of right female breast: Secondary | ICD-10-CM | POA: Insufficient documentation

## 2023-11-21 MED ORDER — FLUDEOXYGLUCOSE F - 18 (FDG) INJECTION
7.1900 | Freq: Once | INTRAVENOUS | Status: AC | PRN
  Administered 2023-11-21: 7.19 via INTRAVENOUS

## 2023-11-26 ENCOUNTER — Inpatient Hospital Stay

## 2023-11-26 ENCOUNTER — Inpatient Hospital Stay (HOSPITAL_BASED_OUTPATIENT_CLINIC_OR_DEPARTMENT_OTHER): Admitting: Oncology

## 2023-11-26 ENCOUNTER — Encounter: Payer: Self-pay | Admitting: Oncology

## 2023-11-26 ENCOUNTER — Other Ambulatory Visit: Payer: Self-pay | Admitting: *Deleted

## 2023-11-26 VITALS — BP 107/62 | HR 94 | Temp 97.5°F | Resp 18

## 2023-11-26 DIAGNOSIS — D539 Nutritional anemia, unspecified: Secondary | ICD-10-CM

## 2023-11-26 DIAGNOSIS — S21001S Unspecified open wound of right breast, sequela: Secondary | ICD-10-CM | POA: Diagnosis not present

## 2023-11-26 DIAGNOSIS — C50411 Malignant neoplasm of upper-outer quadrant of right female breast: Secondary | ICD-10-CM

## 2023-11-26 DIAGNOSIS — Z170 Estrogen receptor positive status [ER+]: Secondary | ICD-10-CM

## 2023-11-26 DIAGNOSIS — C7951 Secondary malignant neoplasm of bone: Secondary | ICD-10-CM

## 2023-11-26 DIAGNOSIS — R197 Diarrhea, unspecified: Secondary | ICD-10-CM | POA: Diagnosis not present

## 2023-11-26 DIAGNOSIS — R052 Subacute cough: Secondary | ICD-10-CM | POA: Diagnosis not present

## 2023-11-26 DIAGNOSIS — Z5112 Encounter for antineoplastic immunotherapy: Secondary | ICD-10-CM | POA: Diagnosis not present

## 2023-11-26 LAB — CBC WITH DIFFERENTIAL/PLATELET
Abs Immature Granulocytes: 0.16 K/uL — ABNORMAL HIGH (ref 0.00–0.07)
Basophils Absolute: 0.1 K/uL (ref 0.0–0.1)
Basophils Relative: 1 %
Eosinophils Absolute: 0.1 K/uL (ref 0.0–0.5)
Eosinophils Relative: 0 %
HCT: 34.8 % — ABNORMAL LOW (ref 36.0–46.0)
Hemoglobin: 11.2 g/dL — ABNORMAL LOW (ref 12.0–15.0)
Immature Granulocytes: 1 %
Lymphocytes Relative: 10 %
Lymphs Abs: 1.3 K/uL (ref 0.7–4.0)
MCH: 32 pg (ref 26.0–34.0)
MCHC: 32.2 g/dL (ref 30.0–36.0)
MCV: 99.4 fL (ref 80.0–100.0)
Monocytes Absolute: 0.7 K/uL (ref 0.1–1.0)
Monocytes Relative: 5 %
Neutro Abs: 10.3 K/uL — ABNORMAL HIGH (ref 1.7–7.7)
Neutrophils Relative %: 83 %
Platelets: 150 K/uL (ref 150–400)
RBC: 3.5 MIL/uL — ABNORMAL LOW (ref 3.87–5.11)
RDW: 17 % — ABNORMAL HIGH (ref 11.5–15.5)
WBC: 12.4 K/uL — ABNORMAL HIGH (ref 4.0–10.5)
nRBC: 0 % (ref 0.0–0.2)

## 2023-11-26 LAB — COMPREHENSIVE METABOLIC PANEL WITH GFR
ALT: 17 U/L (ref 0–44)
AST: 27 U/L (ref 15–41)
Albumin: 3.9 g/dL (ref 3.5–5.0)
Alkaline Phosphatase: 242 U/L — ABNORMAL HIGH (ref 38–126)
Anion gap: 12 (ref 5–15)
BUN: 11 mg/dL (ref 6–20)
CO2: 22 mmol/L (ref 22–32)
Calcium: 8.7 mg/dL — ABNORMAL LOW (ref 8.9–10.3)
Chloride: 102 mmol/L (ref 98–111)
Creatinine, Ser: 0.55 mg/dL (ref 0.44–1.00)
GFR, Estimated: >60 mL/min (ref >60)
Glucose, Bld: 119 mg/dL — ABNORMAL HIGH (ref 70–99)
Potassium: 3.8 mmol/L (ref 3.5–5.1)
Sodium: 137 mmol/L (ref 135–145)
Total Bilirubin: 0.4 mg/dL (ref 0.0–1.2)
Total Protein: 6.8 g/dL (ref 6.5–8.1)

## 2023-11-26 LAB — PHOSPHORUS: Phosphorus: 3.1 mg/dL (ref 2.5–4.6)

## 2023-11-26 LAB — MAGNESIUM: Magnesium: 2.1 mg/dL (ref 1.7–2.4)

## 2023-11-26 MED ORDER — SODIUM CHLORIDE 0.9 % IV SOLN
7.5000 mg/kg | Freq: Once | INTRAVENOUS | Status: AC
  Administered 2023-11-26: 540 mg via INTRAVENOUS
  Filled 2023-11-26: qty 54

## 2023-11-26 MED ORDER — CETIRIZINE HCL 10 MG/ML IV SOLN
10.0000 mg | Freq: Once | INTRAVENOUS | Status: AC
  Administered 2023-11-26: 10 mg via INTRAVENOUS
  Filled 2023-11-26: qty 1

## 2023-11-26 MED ORDER — FAMOTIDINE IN NACL 20-0.9 MG/50ML-% IV SOLN
20.0000 mg | Freq: Once | INTRAVENOUS | Status: AC
  Administered 2023-11-26: 20 mg via INTRAVENOUS
  Filled 2023-11-26: qty 50

## 2023-11-26 MED ORDER — SODIUM CHLORIDE 0.9 % IV SOLN: INTRAVENOUS | Status: DC

## 2023-11-26 MED ORDER — ATROPINE SULFATE 1 MG/ML IV SOLN
0.5000 mg | Freq: Once | INTRAVENOUS | Status: AC
  Administered 2023-11-26: 0.5 mg via INTRAVENOUS
  Filled 2023-11-26: qty 1

## 2023-11-26 MED ORDER — OXYCODONE HCL 5 MG PO TABS: 5.0000 mg | ORAL_TABLET | Freq: Two times a day (BID) | ORAL | 0 refills | Status: DC | PRN

## 2023-11-26 MED ORDER — PALONOSETRON HCL INJECTION 0.25 MG/5ML
0.2500 mg | Freq: Once | INTRAVENOUS | Status: AC
  Administered 2023-11-26: 0.25 mg via INTRAVENOUS
  Filled 2023-11-26: qty 5

## 2023-11-26 MED ORDER — ACETAMINOPHEN 325 MG PO TABS
650.0000 mg | ORAL_TABLET | Freq: Once | ORAL | Status: AC
  Administered 2023-11-26: 650 mg via ORAL
  Filled 2023-11-26: qty 2

## 2023-11-26 MED ORDER — DEXAMETHASONE SOD PHOSPHATE PF 10 MG/ML IJ SOLN
10.0000 mg | Freq: Once | INTRAMUSCULAR | Status: AC
  Administered 2023-11-26: 10 mg via INTRAVENOUS

## 2023-11-26 MED ORDER — SODIUM CHLORIDE 0.9 % IV SOLN
150.0000 mg | Freq: Once | INTRAVENOUS | Status: AC
  Administered 2023-11-26: 150 mg via INTRAVENOUS
  Filled 2023-11-26: qty 150

## 2023-11-26 NOTE — Patient Instructions (Signed)
 [facility] [place] - A DEPT OF [place]  Discharge Instructions: Thank you for choosing [facility] to provide your oncology and hematology care.  If you have a lab appointment with the Cancer Center - please note that after April 8th, 2024, all labs will be drawn in the cancer center.  You do not have to check in or register with the main entrance as you have in the past but will complete your check-in in the cancer center.  Wear comfortable clothing and clothing appropriate for easy access to any Portacath or PICC line.   We strive to give you quality time with your provider. You may need to reschedule your appointment if you arrive late (15 or more minutes).  Arriving late affects you and other patients whose appointments are after yours.  Also, if you miss three or more appointments without notifying the office, you may be dismissed from the clinic at the provider's discretion.      For prescription refill requests, have your pharmacy contact our office and allow 72 hours for refills to be completed.    Today you received the following chemotherapy and/or immunotherapy Trodelvy      To help prevent nausea and vomiting after your treatment, we encourage you to take your nausea medication as directed.  BELOW ARE SYMPTOMS THAT SHOULD BE REPORTED IMMEDIATELY: *FEVER GREATER THAN 100.4 F (38 C) OR HIGHER *CHILLS OR SWEATING *NAUSEA AND VOMITING THAT IS NOT CONTROLLED WITH YOUR NAUSEA MEDICATION *UNUSUAL SHORTNESS OF BREATH *UNUSUAL BRUISING OR BLEEDING *URINARY PROBLEMS (pain or burning when urinating, or frequent urination) *BOWEL PROBLEMS (unusual diarrhea, constipation, pain near the anus) TENDERNESS IN MOUTH AND THROAT WITH OR WITHOUT PRESENCE OF ULCERS (sore throat, sores in mouth, or a toothache) UNUSUAL RASH, SWELLING OR PAIN  UNUSUAL VAGINAL DISCHARGE OR ITCHING   Items with * indicate a potential emergency and should be followed up as soon  as possible or go to the Emergency Department if any problems should occur.  Please show the CHEMOTHERAPY ALERT CARD or IMMUNOTHERAPY ALERT CARD at check-in to the Emergency Department and triage nurse.  Should you have questions after your visit or need to cancel or reschedule your appointment, please contact [facility] [place] - A DEPT OF [facility] [phone]  and follow the prompts.  Office hours are 8:00 a.m. to 4:30 p.m. Monday - Friday. Please note that voicemails left after 4:00 p.m. may not be returned until the following business day.  We are closed weekends and major holidays. You have access to a nurse at all times for urgent questions. Please call the main number to the clinic [phone] and follow the prompts.  For any non-urgent questions, you may also contact your provider using MyChart. We now offer e-Visits for anyone 34 and older to request care online for non-urgent symptoms. For details visit [email or web address].   Also download the MyChart app! Go to the app store, search MyChart, open the app, select [place], and log in with your MyChart username and password.

## 2023-11-26 NOTE — Patient Instructions (Signed)
 [facility] at [facility] Discharge Instructions   You were seen and examined today by Dr. Davonna.  She reviewed the results of your lab work which are normal/stable.   She reviewed the results of your PET scan which is stable. The cancer has not grown or spread.   We will proceed with your treatment today.   Return as scheduled.    Thank you for choosing [facility] at [facility] to provide your oncology and hematology care.  To afford each patient quality time with our provider, please arrive at least 15 minutes before your scheduled appointment time.   If you have a lab appointment with the Cancer Center please come in thru the Main Entrance and check in at the main information desk.  You need to re-schedule your appointment should you arrive 10 or more minutes late.  We strive to give you quality time with our providers, and arriving late affects you and other patients whose appointments are after yours.  Also, if you no show three or more times for appointments you may be dismissed from the clinic at the providers discretion.     Again, thank you for choosing [facility].  Our hope is that these requests will decrease the amount of time that you wait before being seen by our physicians.       _____________________________________________________________  Should you have questions after your visit to [facility], please contact our office at [phone] and follow the prompts.  Our office hours are 8:00 a.m. and 4:30 p.m. Monday - Friday.  Please note that voicemails left after 4:00 p.m. may not be returned until the following business day.  We are closed weekends and major holidays.  You do have access to a nurse 24-7, just call the main number to the clinic [phone] and do not press any options, hold on the line and a nurse will answer the phone.    For prescription refill requests, have your pharmacy  contact our office and allow 72 hours.    Due to Covid, you will need to wear a mask upon entering the hospital. If you do not have a mask, a mask will be given to you at the Main Entrance upon arrival. For doctor visits, patients may have 1 support person age 16 or older with them. For treatment visits, patients can not have anyone with them due to social distancing guidelines and our immunocompromised population.

## 2023-11-26 NOTE — Progress Notes (Signed)
 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   Liver biopsy at this time showed low ER positive, PR negative and HER2 negative disease. Started on carboplatin  and Abraxane  and added pembrolizumab  and PD-L1 NGS came back as 10. Progressed on PET scan from 05/2023 and patient was started on Sacituzumab govitecan  on 06/10/2023    - We reviewed the latest PET scan together.  Patient had good response in all sites including liver and multiple multiple lesions. -Continuing sacituzumab govitecan  cycle 9-day 1 today. - Labs reviewed today: CMP: Creatinine: Normal, alkaline phosphatase slightly elevated from prior.  Normal LFTs.  CBC: WBC: 12.4, hemoglobin: 11.2, platelets: 150. -Recent CA 15-3: 325, CA 27.29: 421.3 - Was seen at [facility] for second opinion and possible clinical trial evaluation.  Seen by Dr. Dominick.  She is being tested for a possible phase 2 clinical trial at progression.  Recommended continuing Sacituzumab govitecan  at this time. - Will repeat PET  scan in 3 months I.e 02/2024 - Physical exam stable today.  Continue with treatment today.   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 Current pain management with ibuprofen  and Tylenol   -Will prescribe oxycodone  5 mg, twice a day, with the option to take once a day before bed.  Normocytic anemia Anemia is improving with a current hemoglobin level of 11.4, which is higher than previous levels.  IV iron  supplementation and dietary changes are contributing to this improvement.  - Continue iron  supplementation every other day. - Encourage dietary intake to support hemoglobin levels.  Metastasis to bone Patient has multiple bony metastasis that are showing mixed response at this time Currently on Zometa  4 mg every 3 months   -Continue Zometa  4 mg IV every 3 months -Continue calcium and vitamin D supplementation  Cough Patient reported cough when she lays down to sleep and reports a lot of postnasal discharge Patient reported improvement.   - Continue Flonase , cetirizine  and pantoprazole  as prescribed  Peripheral neuropathy Peripheral neuropathy is well-managed with no new or worsening symptoms.  Wound care for breast wound The wound is improving, with reduced symptoms. It is kept covered with a bandage to prevent oozing, and occasional itching is likely due to secretions.  - Continue current wound care regimen.   The patient understands the plans discussed today and is in agreement with them.  She knows to contact our office if she develops concerns prior to her next appointment.  The total time spent in the appointment was 30 minutes for the encounter with patient,  including review of chart and various tests results, discussions about plan of care and coordination of care plan   Mickiel Dry, MD  [facility]  - A DEPT OF  [facility] [address] [place] [address] Dept: [phone] Dept Fax: [phone]   No orders of the defined types were placed in this encounter.    ONCOLOGY HISTORY:   I have reviewed her chart and materials related to her cancer extensively and collaborated history with the patient. 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 There are 2 abnormal right axillary lymph nodes, and a third borderline lymph node. Skin thickening is noted along the medial aspect of the left breast, which may be an extension of the marked skin thickening diffusely on the right. -06/22/2022: Right breast biopsy. Pathology: Invasive moderately differentiated ductal adenocarcinoma, grade 2. ER positive (90%), PR positive (70%), Her2 negative (+1). Ki-67: 20%. 2/2 Right axillary lymph nodes positive for metastatic adenocarcinoma.  -07/12/2022: Initial PET: Intensely hypermetabolic RIGHT breast mass consistent with primary breast carcinoma. Hypermetabolic metastatic RIGHT axillary lymph nodes. Hypermetabolic metastatic lymph node in the posterior triangle of the RIGHT neck. Hypermetabolic LEFT axillary lymph nodes. No evidence of metastatic adenopathy in the mediastinum or abdomen pelvis. No evidence of visceral metastasis. Multiple intensely hypermetabolic skeletal metastasis. -07/04/2022: [employer]:Germline testing : Negative. 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 Abnormal enhancement extends posteriorly to broadly and deeply involve the right chest wall including the pectoralis and underlying intercostal muscles with a small area of abnormal  enhancement extending to the underlying pleura. Enhancement extends medially to the right internal mammary chain. 1.8 cm ill-defined enhancing mass in the lateral left breast suspicious for additional malignancy. Left medial breast skin thickening and underlying trabecular thickening with associated abnormal enhancement suspicious for contralateral extension of inflammatory breast carcinoma. Prominent subcentimeter left axillary lymph nodes.  -07/16/2022: Port placement -07/25/2022-12/13/2022: Anastrozole  and ribociclib  treatment, discontinued due to progression 07/27/2022- Current: Zometa  IV 4 mg every 3 months. -08/08/2022: CA 15-3: 201.0. CA 27.29: 337.7. -11/15/2022: PET:  New hypermetabolic skeletal metastasis.  New hypermetabolic liver metastasis. New hypermetabolic periportal lymph nodes. New hypermetabolic cutaneous tissue in the RIGHT breast. New hypermetabolic glandular tissue in the LEFT breast. Persistent hypermetabolic tissue in the RIGHT breast with extension to the skin surface. -12/01/2022: [employer]: APC G1312, TP53, MYC amplification, FGFR2 amplification, EGFR amplification  -12/07/2022: Liver biopsy. Pathology: Metastatic poorly differentiated adenocarcinoma consistent with breast primary.  ER: Positive, 2%, PR: Negative, 0%, HER2: Negative(1+) -12/17/2022:  PD-L1 (22 C3): 0%  -12/21/2022 -05/30/2023: Abraxane  125mg /m2 with carboplatin  2 AUC added on with cycle 2 on 01/10/2023, discontinued due to progression -01/02/2023: [facility] NGS: MS-stable, TMB-low, PTEN pathogenic variant, genomic LOH high (18%), HER2 (1+), PD-L1 (22 C3) CPS-10.  -03/14/2023-05/16/2023:  Added Pembrolizumab  200mg  every 3 weeks -05/30/2023: PET: Substantially worsened burden of metastatic disease to the liver including new and enlarged lesions. Substantially worsened burden of metastatic disease to the skeleton including new and enlarged lesions. New and increased hypermetabolic nodularity along the right breast  cutaneous surface, compatible with cutaneous metastatic disease. Mildly accentuated metabolic activity in a right hilar lymph node, maximum SUV 3.3 and formerly 2.8. -  06/10/2023- current: Sacituzumab govitecan  started with 1 dose of 10mg /kg and currently on 7.5mg /kg every 3 weeks -08/29/2023: PET: Marked interval improvement in RIGHT breast carcinoma. Marked interval improvement in hepatic metastasis. Mixed response in skeletal metastasis. Several lesions are improved compared to prior where several lesions are new. Persistent widespread skeletal metastasis. -10/09/2023: Seen at [facility] by Francetta Sayer. Recommended continuing Sacituzumab govitecan  at this time but also recommended Enhertu at progression.  She has been tested for nectin for amplification to be considered for a clinical trial. - 11/21/2023: PET scan: Bilateral hepatic metastases are relatively similar overall. The inferior right hepatic lobe lesion demonstrates decreased hypermetabolism with similar size. A pericholecystic right hepatic lobe lesion is newly hypermetabolic. The dominant central left hepatic lobe lesion is relatively similar. Osseous metastases are relatively similar overall. A scapular index lesion demonstrates increased activity today. Right breast hypermetabolism is mildly increased.  Current Treatment:  Sacituzumab govitecan  7.5mg /kg every 3 weeks  INTERVAL HISTORY:   Discussed the use of AI scribe software for clinical note transcription with the patient, who gave verbal consent to proceed.  History of Present Illness RIYANSHI WAHAB is a 58 year old female with metastatic breast cancer who presents for follow-up after recent PET scan results.  She is undergoing treatment for metastatic breast cancer with Trodelvy , administered every three weeks. The scan shows that previously bright areas in the bones have become less bright, and some spots are no longer visible. Additionally, liver spots have reduced in size.  She  experiences shoulder pain, which varies in intensity and is worse at night. She manages the pain with ibuprofen  and Tylenol , alternating every four hours.  She has a history of tingling in her toes, which has not worsened. Her cough is well-managed with cetirizine , pantoprazole , and Flonase , although she admits to inconsistent use of Flonase .  Her wound is improving, and she keeps it covered with a bandage to prevent oozing. Occasionally, it itches, which she attributes to secretions.  She is receiving Zometa  every three months for bone metastasis and takes calcium and vitamin D supplements. Her anemia has improved, with a hemoglobin level of 11.2, and she is trying to eat more, attributing improvement to iron  supplementation.   I have reviewed the past medical history, past surgical history, social history and family history with the patient and they are unchanged from previous note.  ALLERGIES:  has no known allergies.  MEDICATIONS:  Current Outpatient Medications  Medication Sig Dispense Refill   Aspirin-Acetaminophen -Caffeine (GOODY HEADACHE PO) Take 1 packet by mouth 2 (two) times daily as needed (for pain).     Cholecalciferol 1.25 MG (50000 UT) capsule Take 1 capsule by mouth once a week.     diphenoxylate -atropine  (LOMOTIL ) 2.5-0.025 MG tablet Take 2 tablets after first loose stool and 1 after each loose stool thereafter.  Do not exceed 8 tablets in 1 day. 30 tablet 3   fluticasone  (FLONASE ) 50 MCG/ACT nasal spray Place 1 spray into both nostrils daily. 16 g 0   fluticasone  (FLONASE ) 50 MCG/ACT nasal spray Place 1 spray into both nostrils daily. 18.2 mL 2   furosemide  (LASIX ) 20 MG tablet TAKE 1 TABLET(20 MG) BY MOUTH DAILY AS NEEDED FOR SWELLING 30 tablet 2   HYDROcodone -acetaminophen  (NORCO/VICODIN) 5-325 MG tablet Take 1 tablet by mouth every 6 (six) hours as needed for moderate pain. 15 tablet 0   lidocaine -prilocaine  (EMLA ) cream APPLY TOPICALLY TO THE AFFECTED AREA 1 TIME 30 g 3    magnesium  oxide (MAG-OX) 400 (240  Mg) MG tablet Take 1 tablet (400 mg total) by mouth 2 (two) times daily. 60 tablet 3   ondansetron  (ZOFRAN ) 8 MG tablet TAKE 1 TABLET(8 MG) BY MOUTH EVERY 8 HOURS AS NEEDED FOR NAUSEA OR VOMITING. START THIRD DAY AFTER DOXORUBICIN/ CYCLOPHOSPHAMIDE CHEMOTHERAPY 30 tablet 2   pantoprazole  (PROTONIX ) 40 MG tablet Take 1 tablet (40 mg total) by mouth daily. 30 tablet 2   prochlorperazine  (COMPAZINE ) 10 MG tablet Take 1 tablet (10 mg total) by mouth every 6 (six) hours as needed for nausea or vomiting. 30 tablet 2   oxyCODONE  (OXY IR/ROXICODONE ) 5 MG immediate release tablet Take 1 tablet (5 mg total) by mouth 2 (two) times daily as needed for severe pain (pain score 7-10). 60 tablet 0   No current facility-administered medications for this visit.   Facility-Administered Medications Ordered in Other Visits  Medication Dose Route Frequency Provider Last Rate Last Admin   0.9 %  sodium chloride  infusion   Intravenous Continuous Dorisann Schwanke, MD   Stopped at 09/24/23 1259    REVIEW OF SYSTEMS:   Constitutional: Positive for fatigue, Denies fevers, chills or abnormal weight loss Eyes: Denies blurriness of vision Ears, nose, mouth, throat, and face: Denies mucositis or sore throat Respiratory: Positive for cough, Denies dyspnea or wheezes Cardiovascular: Denies palpitation, chest discomfort or lower extremity swelling Gastrointestinal: Denies nausea, vomiting, diarrhea, heartburn or change in bowel habits Skin: Denies abnormal skin rashes Lymphatics: Denies new lymphadenopathy or easy bruising Neurological: Positive for numbness in toes, Denies tingling or new weaknesses Behavioral/Psych: Mood is stable, no new changes  All other systems were reviewed with the patient and are negative.   VITALS:  There were no vitals taken for this visit.  Wt Readings from Last 3 Encounters:  11/26/23 150 lb 5.7 oz (68.2 kg)  11/12/23 152 lb (68.9 kg)  11/05/23 150 lb  (68 kg)    There is no height or weight on file to calculate BMI.  Performance status (ECOG): 1 - Symptomatic but completely ambulatory  PHYSICAL EXAM:   GENERAL:alert, no distress and comfortable SKIN: skin color, texture, turgor are normal, no rashes or significant lesions LYMPH:  no palpable lymphadenopathy in the cervical, axillary or inguinal LUNGS: clear to auscultation and percussion with normal breathing effort HEART: regular rate & rhythm and no murmurs and no lower extremity edema ABDOMEN:abdomen soft, non-tender and normal bowel sounds Musculoskeletal:no cyanosis of digits and no clubbing  NEURO: alert & oriented x 3 with fluent speech, no focal motor/sensory deficits   LABORATORY DATA:  I have reviewed the data as listed    Component Value Date/Time   NA 137 11/26/2023 0817   K 3.8 11/26/2023 0817   CL 102 11/26/2023 0817   CO2 22 11/26/2023 0817   GLUCOSE 119 (H) 11/26/2023 0817   BUN 11 11/26/2023 0817   CREATININE 0.55 11/26/2023 0817   CREATININE 0.70 07/17/2022 0945   CALCIUM 8.7 (L) 11/26/2023 0817   PROT 6.8 11/26/2023 0817   ALBUMIN 3.9 11/26/2023 0817   AST 27 11/26/2023 0817   AST 11 (L) 07/17/2022 0945   ALT 17 11/26/2023 0817   ALT 14 07/17/2022 0945   ALKPHOS 242 (H) 11/26/2023 0817   BILITOT 0.4 11/26/2023 0817   BILITOT 0.3 07/17/2022 0945   GFRNONAA >60 11/26/2023 0817   GFRNONAA >60 07/17/2022 0945   GFRAA >60 03/29/2015 1616    Lab Results  Component Value Date   WBC 12.4 (H) 11/26/2023   NEUTROABS 10.3 (H) 11/26/2023  HGB 11.2 (L) 11/26/2023   HCT 34.8 (L) 11/26/2023   MCV 99.4 11/26/2023   PLT 150 11/26/2023    Latest Reference Range & Units 11/12/23 08:23  CA 15-3 0.0 - 25.0 U/mL 325.0 (H)  CA 27.29 0.0 - 38.6 U/mL 421.3 (H)  (H): Data is abnormally high  RADIOGRAPHIC STUDIES: I have personally reviewed the radiological images as listed and agreed with the findings in the report.  NM PET Image Restage (PS) Skull Base to  Thigh (F-18 FDG) EXAM: PET AND CT SKULL BASE TO MID THIGH 11/21/2023 12:10:15 PM  TECHNIQUE:  RADIOPHARMACEUTICAL: 7.19 mCi F-18 FDG Uptake time 50 minutes. Glucose level 110 mg/dl.  PET imaging was acquired from the base of the skull to the mid thighs. Non-contrast enhanced computed tomography was obtained for attenuation correction and anatomic localization.  COMPARISON: 08/29/2023  CLINICAL HISTORY: Metastatic disease evaluation. F-18 FDG 7.42mCi, FBG 110mg /dl, Wt 847oad, Breast cancer restaging, chemo /immunotherapy last Tuesday, biopsy 06/2022, scanned 50 minutes post injection, injection site RAC  FINDINGS:  HEAD AND NECK: No metabolically active cervical lymphadenopathy.  CHEST: No metabolically active pulmonary nodules. No metabolically active lymphadenopathy. Right port a cath tip at mid right atrium. LAD coronary artery calcification. No axillary or subpectoral nodal hypermetabolism. Right axillary node dissection. Lateral right breast hypermetabolism corresponding to soft tissue fullness and skin retraction including at suv 5.1 on image 65 03/2021 today. Compare suv 3.8 on the prior.  ABDOMEN AND PELVIS: Bilateral hepatic metastases again identified. The central left hepatic lesion is difficult to delineate on CT, with similar hypermetabolism at SUV 5.5 on image 84/202. A pericholecystic subcapsular right hepatic lobe lesion measures 1.5 cm and is newly hypermetabolic at SUV 5.1 on image 101/2. Involving the more posterior inferior right hepatic lobe, a metastasis measures 2.0 cm and SUV 3.7 on image 110/2 versus similar in size and SUV 5.4 on the prior. Underlying hepatic capsular irregularity is likely related to treated metastasis. No abdominal pelvic nodal hypermetabolism. Aortic atherosclerosis. Normal adrenal glands. Hysterectomy. Pelvic floor laxity.  BONES AND SOFT TISSUE: Relatively diffuse osseous metastasis. An index lesion within the L5  vertebral body measures 7.0 SUV and is unchanged hypermetabolism. Relatively diffuse activity throughout the inferior left scapula measures SUV 9.2 today versus SUV 5.0 in the prior.  IMPRESSION: 1. Bilateral hepatic metastases are relatively similar overall. The inferior right hepatic lobe lesion demonstrates decreased hypermetabolism with similar size. A pericholecystic right hepatic lobe lesion is newly hypermetabolic. The dominant central left hepatic lobe lesion is relatively similar. 2. Osseous metastases are relatively similar overall. A scapular index lesion demonstrates increased activity today. 3. Right breast hypermetabolism is mildly increased. 4. Age-advanced coronary artery atherosclerosis. Aortic atherosclerosis (ICD-10: I70.0).  Electronically signed by: Rockey Kilts MD 11/22/2023 05:12 PM EDT RP Workstation: [id number]

## 2023-11-26 NOTE — Progress Notes (Signed)
 Labs reviewed today with MD, ok to treat today, HR is 100 and MD is aware. Proceed with treatment as planned.    Treatment given per orders. Patient tolerated it well without problems. Vitals stable and discharged home from clinic ambulatory. Follow up as scheduled.

## 2023-11-26 NOTE — Progress Notes (Signed)
 Patient has been examined by Dr. Davonna. Vital signs (HR 106) and labs have been reviewed by MD - ANC, Creatinine, LFTs, hemoglobin, and platelets have been reviewed by M.D. - pt may proceed with treatment.  Primary RN and pharmacy notified.

## 2023-11-27 ENCOUNTER — Other Ambulatory Visit: Payer: Self-pay

## 2023-12-03 ENCOUNTER — Inpatient Hospital Stay

## 2023-12-03 VITALS — BP 119/63 | HR 83 | Temp 98.4°F | Resp 18

## 2023-12-03 DIAGNOSIS — C50411 Malignant neoplasm of upper-outer quadrant of right female breast: Secondary | ICD-10-CM

## 2023-12-03 DIAGNOSIS — Z5112 Encounter for antineoplastic immunotherapy: Secondary | ICD-10-CM | POA: Diagnosis not present

## 2023-12-03 LAB — CBC WITH DIFFERENTIAL/PLATELET
Abs Immature Granulocytes: 0.03 K/uL (ref 0.00–0.07)
Basophils Absolute: 0.1 K/uL (ref 0.0–0.1)
Basophils Relative: 1 %
Eosinophils Absolute: 0.1 K/uL (ref 0.0–0.5)
Eosinophils Relative: 2 %
HCT: 35.3 % — ABNORMAL LOW (ref 36.0–46.0)
Hemoglobin: 11.1 g/dL — ABNORMAL LOW (ref 12.0–15.0)
Immature Granulocytes: 1 %
Lymphocytes Relative: 24 %
Lymphs Abs: 1 K/uL (ref 0.7–4.0)
MCH: 30.9 pg (ref 26.0–34.0)
MCHC: 31.4 g/dL (ref 30.0–36.0)
MCV: 98.3 fL (ref 80.0–100.0)
Monocytes Absolute: 0.4 K/uL (ref 0.1–1.0)
Monocytes Relative: 9 %
Neutro Abs: 2.8 K/uL (ref 1.7–7.7)
Neutrophils Relative %: 63 %
Platelets: 230 K/uL (ref 150–400)
RBC: 3.59 MIL/uL — ABNORMAL LOW (ref 3.87–5.11)
RDW: 16.2 % — ABNORMAL HIGH (ref 11.5–15.5)
WBC: 4.3 K/uL (ref 4.0–10.5)
nRBC: 0 % (ref 0.0–0.2)

## 2023-12-03 LAB — PHOSPHORUS: Phosphorus: 3.3 mg/dL (ref 2.5–4.6)

## 2023-12-03 LAB — MAGNESIUM: Magnesium: 1.9 mg/dL (ref 1.7–2.4)

## 2023-12-03 LAB — COMPREHENSIVE METABOLIC PANEL WITH GFR
ALT: 16 U/L (ref 0–44)
AST: 26 U/L (ref 15–41)
Albumin: 3.8 g/dL (ref 3.5–5.0)
Alkaline Phosphatase: 233 U/L — ABNORMAL HIGH (ref 38–126)
Anion gap: 13 (ref 5–15)
BUN: 10 mg/dL (ref 6–20)
CO2: 22 mmol/L (ref 22–32)
Calcium: 8.8 mg/dL — ABNORMAL LOW (ref 8.9–10.3)
Chloride: 105 mmol/L (ref 98–111)
Creatinine, Ser: 0.53 mg/dL (ref 0.44–1.00)
GFR, Estimated: >60 mL/min (ref >60)
Glucose, Bld: 101 mg/dL — ABNORMAL HIGH (ref 70–99)
Potassium: 3.8 mmol/L (ref 3.5–5.1)
Sodium: 140 mmol/L (ref 135–145)
Total Bilirubin: <0.2 mg/dL (ref 0.0–1.2)
Total Protein: 6.6 g/dL (ref 6.5–8.1)

## 2023-12-03 MED ORDER — SODIUM CHLORIDE 0.9 % IV SOLN
150.0000 mg | Freq: Once | INTRAVENOUS | Status: AC
  Administered 2023-12-03: 150 mg via INTRAVENOUS
  Filled 2023-12-03: qty 150

## 2023-12-03 MED ORDER — ATROPINE SULFATE 1 MG/ML IV SOLN
0.5000 mg | Freq: Once | INTRAVENOUS | Status: AC
  Administered 2023-12-03: 0.5 mg via INTRAVENOUS
  Filled 2023-12-03: qty 1

## 2023-12-03 MED ORDER — FAMOTIDINE IN NACL 20-0.9 MG/50ML-% IV SOLN
20.0000 mg | Freq: Once | INTRAVENOUS | Status: AC
  Administered 2023-12-03: 20 mg via INTRAVENOUS
  Filled 2023-12-03: qty 50

## 2023-12-03 MED ORDER — PALONOSETRON HCL INJECTION 0.25 MG/5ML
0.2500 mg | Freq: Once | INTRAVENOUS | Status: AC
  Administered 2023-12-03: 0.25 mg via INTRAVENOUS
  Filled 2023-12-03: qty 5

## 2023-12-03 MED ORDER — SODIUM CHLORIDE 0.9 % IV SOLN
7.5000 mg/kg | Freq: Once | INTRAVENOUS | Status: AC
  Administered 2023-12-03: 540 mg via INTRAVENOUS
  Filled 2023-12-03: qty 54

## 2023-12-03 MED ORDER — DEXAMETHASONE SOD PHOSPHATE PF 10 MG/ML IJ SOLN
10.0000 mg | Freq: Once | INTRAMUSCULAR | Status: AC
  Administered 2023-12-03: 10 mg via INTRAVENOUS

## 2023-12-03 MED ORDER — CETIRIZINE HCL 10 MG/ML IV SOLN
10.0000 mg | Freq: Once | INTRAVENOUS | Status: AC
  Administered 2023-12-03: 10 mg via INTRAVENOUS
  Filled 2023-12-03: qty 1

## 2023-12-03 MED ORDER — ACETAMINOPHEN 325 MG PO TABS
650.0000 mg | ORAL_TABLET | Freq: Once | ORAL | Status: AC
  Administered 2023-12-03: 650 mg via ORAL
  Filled 2023-12-03: qty 2

## 2023-12-03 MED ORDER — SODIUM CHLORIDE 0.9 % IV SOLN: INTRAVENOUS | Status: DC

## 2023-12-03 NOTE — Progress Notes (Signed)
Patient presents today for Trodelvy infusion per providers order.  Vital signs and labs within parameters for treatment.  Patient has no new complaints at this time.  Treatment given today per MD orders.  Stable during infusion without adverse affects.  Vital signs stable.  No complaints at this time.  Discharge from clinic ambulatory in stable condition.  Alert and oriented X 3.  Follow up with [facility] as scheduled.

## 2023-12-03 NOTE — Patient Instructions (Signed)
 [facility] [place] - A DEPT OF [place]  Discharge Instructions: Thank you for choosing [facility] to provide your oncology and hematology care.  If you have a lab appointment with the Cancer Center - please note that after April 8th, 2024, all labs will be drawn in the cancer center.  You do not have to check in or register with the main entrance as you have in the past but will complete your check-in in the cancer center.  Wear comfortable clothing and clothing appropriate for easy access to any Portacath or PICC line.   We strive to give you quality time with your provider. You may need to reschedule your appointment if you arrive late (15 or more minutes).  Arriving late affects you and other patients whose appointments are after yours.  Also, if you miss three or more appointments without notifying the office, you may be dismissed from the clinic at the provider's discretion.      For prescription refill requests, have your pharmacy contact our office and allow 72 hours for refills to be completed.    Today you received the following chemotherapy and/or immunotherapy agents Monica Soto   To help prevent nausea and vomiting after your treatment, we encourage you to take your nausea medication as directed.  BELOW ARE SYMPTOMS THAT SHOULD BE REPORTED IMMEDIATELY: *FEVER GREATER THAN 100.4 F (38 C) OR HIGHER *CHILLS OR SWEATING *NAUSEA AND VOMITING THAT IS NOT CONTROLLED WITH YOUR NAUSEA MEDICATION *UNUSUAL SHORTNESS OF BREATH *UNUSUAL BRUISING OR BLEEDING *URINARY PROBLEMS (pain or burning when urinating, or frequent urination) *BOWEL PROBLEMS (unusual diarrhea, constipation, pain near the anus) TENDERNESS IN MOUTH AND THROAT WITH OR WITHOUT PRESENCE OF ULCERS (sore throat, sores in mouth, or a toothache) UNUSUAL RASH, SWELLING OR PAIN  UNUSUAL VAGINAL DISCHARGE OR ITCHING   Items with * indicate a potential emergency and should be followed up as  soon as possible or go to the Emergency Department if any problems should occur.  Please show the CHEMOTHERAPY ALERT CARD or IMMUNOTHERAPY ALERT CARD at check-in to the Emergency Department and triage nurse.  Should you have questions after your visit or need to cancel or reschedule your appointment, please contact [facility] [place] - A DEPT OF [facility] [phone]  and follow the prompts.  Office hours are 8:00 a.m. to 4:30 p.m. Monday - Friday. Please note that voicemails left after 4:00 p.m. may not be returned until the following business day.  We are closed weekends and major holidays. You have access to a nurse at all times for urgent questions. Please call the main number to the clinic [phone] and follow the prompts.  For any non-urgent questions, you may also contact your provider using MyChart. We now offer e-Visits for anyone 15 and older to request care online for non-urgent symptoms. For details visit [email or web address].   Also download the MyChart app! Go to the app store, search "MyChart", open the app, select [place], and log in with your MyChart username and password.

## 2023-12-04 ENCOUNTER — Inpatient Hospital Stay

## 2023-12-04 VITALS — BP 124/82 | HR 69 | Temp 97.6°F | Resp 18

## 2023-12-04 DIAGNOSIS — Z170 Estrogen receptor positive status [ER+]: Secondary | ICD-10-CM

## 2023-12-04 DIAGNOSIS — Z5112 Encounter for antineoplastic immunotherapy: Secondary | ICD-10-CM | POA: Diagnosis not present

## 2023-12-04 LAB — CANCER ANTIGEN 15-3: CA 15-3: 415 U/mL — ABNORMAL HIGH (ref 0.0–25.0)

## 2023-12-04 LAB — CANCER ANTIGEN 27.29: CA 27.29: 552.5 U/mL — ABNORMAL HIGH (ref 0.0–38.6)

## 2023-12-04 MED ORDER — PEGFILGRASTIM-CBQV 6 MG/0.6ML ~~LOC~~ SOSY
6.0000 mg | PREFILLED_SYRINGE | Freq: Once | SUBCUTANEOUS | Status: AC
  Administered 2023-12-04: 6 mg via SUBCUTANEOUS
  Filled 2023-12-04: qty 0.6

## 2023-12-04 NOTE — Progress Notes (Signed)
 Patient tolerated Udenyca  injection with no complaints voiced.  Site clean and dry with no bruising or swelling noted at site.  See MAR for details.  Band aid applied.  Patient stable during and after injection.  Vss with discharge and left in satisfactory condition with no s/s of distress noted. All follow ups a scheduled.   Deshawn Skelley

## 2023-12-04 NOTE — Patient Instructions (Signed)

## 2023-12-17 ENCOUNTER — Inpatient Hospital Stay: Attending: Hematology and Oncology

## 2023-12-17 ENCOUNTER — Inpatient Hospital Stay: Admitting: Oncology

## 2023-12-17 ENCOUNTER — Encounter: Payer: Self-pay | Admitting: Oncology

## 2023-12-17 ENCOUNTER — Inpatient Hospital Stay

## 2023-12-17 VITALS — BP 108/70 | HR 90 | Temp 98.1°F | Resp 18

## 2023-12-17 DIAGNOSIS — Z5112 Encounter for antineoplastic immunotherapy: Secondary | ICD-10-CM | POA: Insufficient documentation

## 2023-12-17 DIAGNOSIS — D649 Anemia, unspecified: Secondary | ICD-10-CM | POA: Diagnosis not present

## 2023-12-17 DIAGNOSIS — C50411 Malignant neoplasm of upper-outer quadrant of right female breast: Secondary | ICD-10-CM | POA: Insufficient documentation

## 2023-12-17 DIAGNOSIS — C7951 Secondary malignant neoplasm of bone: Secondary | ICD-10-CM | POA: Diagnosis not present

## 2023-12-17 DIAGNOSIS — Z1732 Human epidermal growth factor receptor 2 negative status: Secondary | ICD-10-CM | POA: Insufficient documentation

## 2023-12-17 DIAGNOSIS — Z79899 Other long term (current) drug therapy: Secondary | ICD-10-CM | POA: Diagnosis not present

## 2023-12-17 DIAGNOSIS — R5383 Other fatigue: Secondary | ICD-10-CM | POA: Diagnosis not present

## 2023-12-17 DIAGNOSIS — R059 Cough, unspecified: Secondary | ICD-10-CM | POA: Diagnosis not present

## 2023-12-17 DIAGNOSIS — C787 Secondary malignant neoplasm of liver and intrahepatic bile duct: Secondary | ICD-10-CM | POA: Diagnosis not present

## 2023-12-17 DIAGNOSIS — Z170 Estrogen receptor positive status [ER+]: Secondary | ICD-10-CM | POA: Insufficient documentation

## 2023-12-17 DIAGNOSIS — G893 Neoplasm related pain (acute) (chronic): Secondary | ICD-10-CM | POA: Diagnosis not present

## 2023-12-17 DIAGNOSIS — Z1721 Progesterone receptor positive status: Secondary | ICD-10-CM | POA: Insufficient documentation

## 2023-12-17 DIAGNOSIS — G629 Polyneuropathy, unspecified: Secondary | ICD-10-CM | POA: Diagnosis not present

## 2023-12-17 LAB — COMPREHENSIVE METABOLIC PANEL WITH GFR
ALT: 18 U/L (ref 0–44)
AST: 31 U/L (ref 15–41)
Albumin: 3.8 g/dL (ref 3.5–5.0)
Alkaline Phosphatase: 240 U/L — ABNORMAL HIGH (ref 38–126)
Anion gap: 12 (ref 5–15)
BUN: 8 mg/dL (ref 6–20)
CO2: 23 mmol/L (ref 22–32)
Calcium: 9.1 mg/dL (ref 8.9–10.3)
Chloride: 103 mmol/L (ref 98–111)
Creatinine, Ser: 0.58 mg/dL (ref 0.44–1.00)
GFR, Estimated: >60 mL/min (ref >60)
Glucose, Bld: 126 mg/dL — ABNORMAL HIGH (ref 70–99)
Potassium: 3.8 mmol/L (ref 3.5–5.1)
Sodium: 137 mmol/L (ref 135–145)
Total Bilirubin: 0.5 mg/dL (ref 0.0–1.2)
Total Protein: 6.5 g/dL (ref 6.5–8.1)

## 2023-12-17 LAB — CBC WITH DIFFERENTIAL/PLATELET
Abs Immature Granulocytes: 0.06 K/uL (ref 0.00–0.07)
Basophils Absolute: 0 K/uL (ref 0.0–0.1)
Basophils Relative: 0 %
Eosinophils Absolute: 0.1 K/uL (ref 0.0–0.5)
Eosinophils Relative: 1 %
HCT: 34 % — ABNORMAL LOW (ref 36.0–46.0)
Hemoglobin: 10.9 g/dL — ABNORMAL LOW (ref 12.0–15.0)
Immature Granulocytes: 1 %
Lymphocytes Relative: 14 %
Lymphs Abs: 1.3 K/uL (ref 0.7–4.0)
MCH: 31.8 pg (ref 26.0–34.0)
MCHC: 32.1 g/dL (ref 30.0–36.0)
MCV: 99.1 fL (ref 80.0–100.0)
Monocytes Absolute: 0.5 K/uL (ref 0.1–1.0)
Monocytes Relative: 6 %
Neutro Abs: 6.9 K/uL (ref 1.7–7.7)
Neutrophils Relative %: 78 %
Platelets: 162 K/uL (ref 150–400)
RBC: 3.43 MIL/uL — ABNORMAL LOW (ref 3.87–5.11)
RDW: 17 % — ABNORMAL HIGH (ref 11.5–15.5)
WBC: 8.9 K/uL (ref 4.0–10.5)
nRBC: 0 % (ref 0.0–0.2)

## 2023-12-17 LAB — PHOSPHORUS: Phosphorus: 3.4 mg/dL (ref 2.5–4.6)

## 2023-12-17 LAB — MAGNESIUM: Magnesium: 1.9 mg/dL (ref 1.7–2.4)

## 2023-12-17 MED ORDER — FAMOTIDINE IN NACL 20-0.9 MG/50ML-% IV SOLN
20.0000 mg | Freq: Once | INTRAVENOUS | Status: AC
  Administered 2023-12-17: 20 mg via INTRAVENOUS
  Filled 2023-12-17: qty 50

## 2023-12-17 MED ORDER — CETIRIZINE HCL 10 MG/ML IV SOLN
10.0000 mg | Freq: Once | INTRAVENOUS | Status: AC
  Administered 2023-12-17: 10 mg via INTRAVENOUS
  Filled 2023-12-17: qty 1

## 2023-12-17 MED ORDER — PALONOSETRON HCL INJECTION 0.25 MG/5ML
0.2500 mg | Freq: Once | INTRAVENOUS | Status: AC
  Administered 2023-12-17: 0.25 mg via INTRAVENOUS
  Filled 2023-12-17: qty 5

## 2023-12-17 MED ORDER — SODIUM CHLORIDE 0.9 % IV SOLN: INTRAVENOUS | Status: DC

## 2023-12-17 MED ORDER — SODIUM CHLORIDE 0.9 % IV SOLN
7.5000 mg/kg | Freq: Once | INTRAVENOUS | Status: AC
  Administered 2023-12-17: 540 mg via INTRAVENOUS
  Filled 2023-12-17: qty 54

## 2023-12-17 MED ORDER — ACETAMINOPHEN 325 MG PO TABS
650.0000 mg | ORAL_TABLET | Freq: Once | ORAL | Status: AC
  Administered 2023-12-17: 650 mg via ORAL
  Filled 2023-12-17: qty 2

## 2023-12-17 MED ORDER — SODIUM CHLORIDE 0.9 % IV SOLN
150.0000 mg | Freq: Once | INTRAVENOUS | Status: AC
  Administered 2023-12-17: 150 mg via INTRAVENOUS
  Filled 2023-12-17: qty 150

## 2023-12-17 MED ORDER — DEXAMETHASONE SOD PHOSPHATE PF 10 MG/ML IJ SOLN
10.0000 mg | Freq: Once | INTRAMUSCULAR | Status: AC
  Administered 2023-12-17: 10 mg via INTRAVENOUS

## 2023-12-17 MED ORDER — ATROPINE SULFATE 1 MG/ML IV SOLN
0.5000 mg | Freq: Once | INTRAVENOUS | Status: AC
  Administered 2023-12-17: 0.5 mg via INTRAVENOUS
  Filled 2023-12-17 (×2): qty 1

## 2023-12-17 NOTE — Progress Notes (Signed)
 SPIRITUAL CARE AND COUNSELING CONSULT NOTE   VISIT SUMMARY   I sat and talked with Ricka for approx 30 minutes.  We discussed her latest PET scan which she reports showed some reduction in spots and no new growth.  Dr Davonna was pleased with this, according to Pt, and this comforted Tamila.  She is encouraged by the results as well.  SPIRITUAL ENCOUNTER                                                                                                                                                                      Type of Visit: Follow up Care provided to:: Patient Referral source: Chaplain assessment Reason for visit: Routine spiritual support   SPIRITUAL FRAMEWORK  Presenting Themes: Goals in life/care, Values and beliefs, Community and relationships Values/beliefs: Anjannette lvoes her family and valus spending time with them Community/Connection: Family, Friend(s) Strengths: Perseverance, Hope, and Perspective (she understands her place and purpose in this world) Patient Stress Factors: None identified Family Stress Factors: Not reviewed   GOALS   Self/Personal Goals: Continue treatment and hold any cnacer advancement at bay Clinical Care Goals: Provide a caring and supportive presence   INTERVENTIONS   Spiritual Care Interventions Made: Compassionate presence, Narrative/life review, Explored values/beliefs/practices/strengths, Encouragement    INTERVENTION OUTCOMES   Outcomes: Awareness of support, Reduced isolation  SPIRITUAL CARE PLAN   Spiritual Care Issues Still Outstanding: Chaplain will continue to follow    Maude Roll, MDiv Chaplain, [email or web address] [phone]  12/17/2023 11:50 AM

## 2023-12-17 NOTE — Patient Instructions (Signed)
 [facility] [place] - A DEPT OF [facility]  Discharge Instructions: Thank you for choosing [facility] to provide your oncology and hematology care.  If you have a lab appointment with the Cancer Center - please note that after April 8th, 2024, all labs will be drawn in the cancer center.  You do not have to check in or register with the main entrance as you have in the past but will complete your check-in in the cancer center.  Wear comfortable clothing and clothing appropriate for easy access to any Portacath or PICC line.   We strive to give you quality time with your provider. You may need to reschedule your appointment if you arrive late (15 or more minutes).  Arriving late affects you and other patients whose appointments are after yours.  Also, if you miss three or more appointments without notifying the office, you may be dismissed from the clinic at the provider's discretion.      For prescription refill requests, have your pharmacy contact our office and allow 72 hours for refills to be completed.    Today you received the following chemotherapy and/or immunotherapy agents Trodelvy .   Sacituzumab Govitecan  Injection What is this medication? SACITUZUMAB GOVITECAN  (SAK i TOOZ ue mab GOE vi TEE can) treats breast cancer. It works by blocking a protein that causes cancer cells to grow and multiply. This helps to slow or stop the spread of cancer cells. This medicine may be used for other purposes; ask your health care provider or pharmacist if you have questions. COMMON BRAND NAME(S): TRODELVY  What should I tell my care team before I take this medication? They need to know if you have any of these conditions: Carry the UGT1A1*28 gene Infection Liver disease An unusual or allergic reaction to sacituzumab govitecan , other medications, foods, dyes, or preservatives Pregnant or trying to get pregnant Breast-feeding How should I use this  medication? This medication is injected into a vein. It is given by your care team in a hospital or clinic setting. Talk to your care team about the use of this medication in children. Special care may be needed. Overdosage: If you think you have taken too much of this medicine contact a poison control center or emergency room at once. NOTE: This medicine is only for you. Do not share this medicine with others. What if I miss a dose? Keep appointments for follow-up doses. It is important not to miss your dose. Call your care team if you are unable to keep an appointment. What may interact with this medication? This medication may affect how other medications work, and other medications may affect the way this medication works. Talk with your care team about all of the medications you take. They may suggest changes to your treatment plan to lower the risk of side effects and to make sure your medications work as intended. This list may not describe all possible interactions. Give your health care provider a list of all the medicines, herbs, non-prescription drugs, or dietary supplements you use. Also tell them if you smoke, drink alcohol, or use illegal drugs. Some items may interact with your medicine. What should I watch for while using this medication? This medication may make you feel generally unwell. This is not uncommon as chemotherapy can affect healthy cells as well as cancer cells. Report any side effects. Continue your course of treatment even though you feel ill unless your care team tells you to stop.  You may need blood work while you are taking this medication. Certain genetic factors may decrease the safety of this medication. Your care team may use genetic tests to determine treatment. This medication can cause serious allergic reactions. To reduce your risk, your care team may give you other medications to take before receiving this one. Be sure to follow the directions from your care  team. Check with your care team if you have severe diarrhea, nausea, and vomiting, or if you sweat a lot. The loss of too much body fluid may make it dangerous for you to take this medication. Talk to your care team if you wish to become pregnant or think you might be pregnant. This medication can cause serious birth defects if taken during pregnancy or if you get pregnant within 6 months after stopping treatment. A negative pregnancy test is required before starting this medication. A reliable form of contraception is recommended while taking this medication and for 6 months after stopping treatment. Talk to your care team about reliable forms of contraception. Use a condom during sex and for 3 months after stopping treatment. Tell your care team right away if you think your partner might be pregnant. This medication can cause serious birth defects. Do not breast-feed while taking this medication and for 1 month after stopping therapy. This medication may cause infertility. Talk to your care team if you are concerned about your fertility. This medication may increase your risk of getting an infection. Call your care team for advice if you get a fever, chills, sore throat, or other symptoms of a cold or flu. Do not treat yourself. Try to avoid being around people who are sick. Avoid taking medications that contain aspirin, acetaminophen , ibuprofen , naproxen , or ketoprofen unless instructed by your care team. These medications may hide a fever. This medication may increase blood sugar. The risk may be higher in patients who already have diabetes. Ask your care team what you can do to lower your risk of diabetes while taking this medication. What side effects may I notice from receiving this medication? Side effects that you should report to your care team as soon as possible: Allergic reactions--skin rash, itching, hives, swelling of the face, lips, tongue, or throat Infection--fever, chills, cough, or  sore throat Infusion reactions--chest pain, shortness of breath or trouble breathing, feeling faint or lightheaded Low red blood cell level--unusual weakness or fatigue, dizziness, headache, trouble breathing Severe or prolonged diarrhea Side effects that usually do not require medical attention (report these to your care team if they continue or are bothersome): Constipation Diarrhea Fatigue Hair loss Loss of appetite Nausea Vomiting This list may not describe all possible side effects. Call your doctor for medical advice about side effects. You may report side effects to FDA at 1-800-FDA-1088. Where should I keep my medication? This medication is given in a hospital or clinic. It will not be stored at home. NOTE: This sheet is a summary. It may not cover all possible information. If you have questions about this medicine, talk to your doctor, pharmacist, or health care provider.  2025 Elsevier/Gold Standard (2023-01-07 00:00:00)   To help prevent nausea and vomiting after your treatment, we encourage you to take your nausea medication as directed.  BELOW ARE SYMPTOMS THAT SHOULD BE REPORTED IMMEDIATELY: *FEVER GREATER THAN 100.4 F (38 C) OR HIGHER *CHILLS OR SWEATING *NAUSEA AND VOMITING THAT IS NOT CONTROLLED WITH YOUR NAUSEA MEDICATION *UNUSUAL SHORTNESS OF BREATH *UNUSUAL BRUISING OR BLEEDING *URINARY PROBLEMS (pain or  burning when urinating, or frequent urination) *BOWEL PROBLEMS (unusual diarrhea, constipation, pain near the anus) TENDERNESS IN MOUTH AND THROAT WITH OR WITHOUT PRESENCE OF ULCERS (sore throat, sores in mouth, or a toothache) UNUSUAL RASH, SWELLING OR PAIN  UNUSUAL VAGINAL DISCHARGE OR ITCHING   Items with * indicate a potential emergency and should be followed up as soon as possible or go to the Emergency Department if any problems should occur.  Please show the CHEMOTHERAPY ALERT CARD or IMMUNOTHERAPY ALERT CARD at check-in to the Emergency Department and  triage nurse.  Should you have questions after your visit or need to cancel or reschedule your appointment, please contact [facility] [place] - A DEPT OF [facility] [phone]  and follow the prompts.  Office hours are 8:00 a.m. to 4:30 p.m. Monday - Friday. Please note that voicemails left after 4:00 p.m. may not be returned until the following business day.  We are closed weekends and major holidays. You have access to a nurse at all times for urgent questions. Please call the main number to the clinic [phone] and follow the prompts.  For any non-urgent questions, you may also contact your provider using MyChart. We now offer e-Visits for anyone 79 and older to request care online for non-urgent symptoms. For details visit [email or web address].   Also download the MyChart app! Go to the app store, search "MyChart", open the app, select [facility], and log in with your MyChart username and password.

## 2023-12-17 NOTE — Progress Notes (Signed)
 Patient presents today for Trodelvy  infusion. Vital signs and lab work within parameters for treatment. Patient denies any side effects related to last treatment.   Treatment given today per MD orders. Tolerated infusion without adverse affects. Vital signs stable. No complaints at this time. Discharged from clinic ambulatory in stable condition. Alert and oriented x 3. F/U with [facility] as scheduled.

## 2023-12-24 ENCOUNTER — Inpatient Hospital Stay

## 2023-12-24 VITALS — BP 130/67 | HR 77 | Temp 97.4°F | Resp 18

## 2023-12-24 DIAGNOSIS — Z5112 Encounter for antineoplastic immunotherapy: Secondary | ICD-10-CM | POA: Diagnosis not present

## 2023-12-24 DIAGNOSIS — Z170 Estrogen receptor positive status [ER+]: Secondary | ICD-10-CM

## 2023-12-24 LAB — COMPREHENSIVE METABOLIC PANEL WITH GFR
ALT: 16 U/L (ref 0–44)
AST: 28 U/L (ref 15–41)
Albumin: 3.8 g/dL (ref 3.5–5.0)
Alkaline Phosphatase: 210 U/L — ABNORMAL HIGH (ref 38–126)
Anion gap: 13 (ref 5–15)
BUN: 9 mg/dL (ref 6–20)
CO2: 22 mmol/L (ref 22–32)
Calcium: 8.7 mg/dL — ABNORMAL LOW (ref 8.9–10.3)
Chloride: 104 mmol/L (ref 98–111)
Creatinine, Ser: 0.56 mg/dL (ref 0.44–1.00)
GFR, Estimated: >60 mL/min (ref >60)
Glucose, Bld: 135 mg/dL — ABNORMAL HIGH (ref 70–99)
Potassium: 3.6 mmol/L (ref 3.5–5.1)
Sodium: 139 mmol/L (ref 135–145)
Total Bilirubin: <0.2 mg/dL (ref 0.0–1.2)
Total Protein: 6.3 g/dL — ABNORMAL LOW (ref 6.5–8.1)

## 2023-12-24 LAB — CBC WITH DIFFERENTIAL/PLATELET
Abs Immature Granulocytes: 0.03 K/uL (ref 0.00–0.07)
Basophils Absolute: 0 K/uL (ref 0.0–0.1)
Basophils Relative: 1 %
Eosinophils Absolute: 0.1 K/uL (ref 0.0–0.5)
Eosinophils Relative: 2 %
HCT: 34.7 % — ABNORMAL LOW (ref 36.0–46.0)
Hemoglobin: 10.8 g/dL — ABNORMAL LOW (ref 12.0–15.0)
Immature Granulocytes: 1 %
Lymphocytes Relative: 23 %
Lymphs Abs: 1 K/uL (ref 0.7–4.0)
MCH: 30.7 pg (ref 26.0–34.0)
MCHC: 31.1 g/dL (ref 30.0–36.0)
MCV: 98.6 fL (ref 80.0–100.0)
Monocytes Absolute: 0.4 K/uL (ref 0.1–1.0)
Monocytes Relative: 8 %
Neutro Abs: 2.9 K/uL (ref 1.7–7.7)
Neutrophils Relative %: 65 %
Platelets: 225 K/uL (ref 150–400)
RBC: 3.52 MIL/uL — ABNORMAL LOW (ref 3.87–5.11)
RDW: 16.7 % — ABNORMAL HIGH (ref 11.5–15.5)
WBC: 4.5 K/uL (ref 4.0–10.5)
nRBC: 0 % (ref 0.0–0.2)

## 2023-12-24 LAB — PHOSPHORUS: Phosphorus: 3.8 mg/dL (ref 2.5–4.6)

## 2023-12-24 LAB — MAGNESIUM: Magnesium: 1.6 mg/dL — ABNORMAL LOW (ref 1.7–2.4)

## 2023-12-24 MED ORDER — FAMOTIDINE IN NACL 20-0.9 MG/50ML-% IV SOLN
20.0000 mg | Freq: Once | INTRAVENOUS | Status: AC
  Administered 2023-12-24: 20 mg via INTRAVENOUS
  Filled 2023-12-24: qty 50

## 2023-12-24 MED ORDER — ATROPINE SULFATE 1 MG/ML IV SOLN
0.5000 mg | Freq: Once | INTRAVENOUS | Status: AC
  Administered 2023-12-24: 0.5 mg via INTRAVENOUS
  Filled 2023-12-24: qty 1

## 2023-12-24 MED ORDER — CETIRIZINE HCL 10 MG/ML IV SOLN
10.0000 mg | Freq: Once | INTRAVENOUS | Status: AC
  Administered 2023-12-24: 10 mg via INTRAVENOUS
  Filled 2023-12-24: qty 1

## 2023-12-24 MED ORDER — ACETAMINOPHEN 325 MG PO TABS
650.0000 mg | ORAL_TABLET | Freq: Once | ORAL | Status: AC
  Administered 2023-12-24: 650 mg via ORAL
  Filled 2023-12-24: qty 2

## 2023-12-24 MED ORDER — SODIUM CHLORIDE 0.9 % IV SOLN: INTRAVENOUS | Status: DC

## 2023-12-24 MED ORDER — SODIUM CHLORIDE 0.9 % IV SOLN
7.5000 mg/kg | Freq: Once | INTRAVENOUS | Status: AC
  Administered 2023-12-24: 540 mg via INTRAVENOUS
  Filled 2023-12-24: qty 54

## 2023-12-24 MED ORDER — PALONOSETRON HCL INJECTION 0.25 MG/5ML
0.2500 mg | Freq: Once | INTRAVENOUS | Status: AC
  Administered 2023-12-24: 0.25 mg via INTRAVENOUS
  Filled 2023-12-24: qty 5

## 2023-12-24 MED ORDER — SODIUM CHLORIDE 0.9 % IV SOLN
150.0000 mg | Freq: Once | INTRAVENOUS | Status: AC
  Administered 2023-12-24: 150 mg via INTRAVENOUS
  Filled 2023-12-24: qty 150

## 2023-12-24 MED ORDER — MAGNESIUM SULFATE 2 GM/50ML IV SOLN
2.0000 g | Freq: Once | INTRAVENOUS | Status: AC
  Administered 2023-12-24: 2 g via INTRAVENOUS
  Filled 2023-12-24: qty 50

## 2023-12-24 MED ORDER — DEXAMETHASONE SOD PHOSPHATE PF 10 MG/ML IJ SOLN
10.0000 mg | Freq: Once | INTRAMUSCULAR | Status: AC
  Administered 2023-12-24: 10 mg via INTRAVENOUS

## 2023-12-24 NOTE — Patient Instructions (Signed)
 [facility] [place] - A DEPT OF [facility]  Discharge Instructions: Thank you for choosing [facility] to provide your oncology and hematology care.  If you have a lab appointment with the Cancer Center - please note that after April 8th, 2024, all labs will be drawn in the cancer center.  You do not have to check in or register with the main entrance as you have in the past but will complete your check-in in the cancer center.  Wear comfortable clothing and clothing appropriate for easy access to any Portacath or PICC line.   We strive to give you quality time with your provider. You may need to reschedule your appointment if you arrive late (15 or more minutes).  Arriving late affects you and other patients whose appointments are after yours.  Also, if you miss three or more appointments without notifying the office, you may be dismissed from the clinic at the provider's discretion.      For prescription refill requests, have your pharmacy contact our office and allow 72 hours for refills to be completed.    Today you received the following chemotherapy and/or immunotherapy agents Trodelvy    To help prevent nausea and vomiting after your treatment, we encourage you to take your nausea medication as directed.   Sacituzumab Govitecan  Injection What is this medication? SACITUZUMAB GOVITECAN  (SAK i TOOZ ue mab GOE vi TEE can) treats breast cancer. It works by blocking a protein that causes cancer cells to grow and multiply. This helps to slow or stop the spread of cancer cells. This medicine may be used for other purposes; ask your health care provider or pharmacist if you have questions. COMMON BRAND NAME(S): TRODELVY  What should I tell my care team before I take this medication? They need to know if you have any of these conditions: Carry the UGT1A1*28 gene Infection Liver disease An unusual or allergic reaction to sacituzumab govitecan , other  medications, foods, dyes, or preservatives Pregnant or trying to get pregnant Breast-feeding How should I use this medication? This medication is injected into a vein. It is given by your care team in a hospital or clinic setting. Talk to your care team about the use of this medication in children. Special care may be needed. Overdosage: If you think you have taken too much of this medicine contact a poison control center or emergency room at once. NOTE: This medicine is only for you. Do not share this medicine with others. What if I miss a dose? Keep appointments for follow-up doses. It is important not to miss your dose. Call your care team if you are unable to keep an appointment. What may interact with this medication? This medication may affect how other medications work, and other medications may affect the way this medication works. Talk with your care team about all of the medications you take. They may suggest changes to your treatment plan to lower the risk of side effects and to make sure your medications work as intended. This list may not describe all possible interactions. Give your health care provider a list of all the medicines, herbs, non-prescription drugs, or dietary supplements you use. Also tell them if you smoke, drink alcohol, or use illegal drugs. Some items may interact with your medicine. What should I watch for while using this medication? This medication may make you feel generally unwell. This is not uncommon as chemotherapy can affect healthy cells as well as cancer cells. Report  any side effects. Continue your course of treatment even though you feel ill unless your care team tells you to stop. You may need blood work while you are taking this medication. Certain genetic factors may decrease the safety of this medication. Your care team may use genetic tests to determine treatment. This medication can cause serious allergic reactions. To reduce your risk, your care  team may give you other medications to take before receiving this one. Be sure to follow the directions from your care team. Check with your care team if you have severe diarrhea, nausea, and vomiting, or if you sweat a lot. The loss of too much body fluid may make it dangerous for you to take this medication. Talk to your care team if you wish to become pregnant or think you might be pregnant. This medication can cause serious birth defects if taken during pregnancy or if you get pregnant within 6 months after stopping treatment. A negative pregnancy test is required before starting this medication. A reliable form of contraception is recommended while taking this medication and for 6 months after stopping treatment. Talk to your care team about reliable forms of contraception. Use a condom during sex and for 3 months after stopping treatment. Tell your care team right away if you think your partner might be pregnant. This medication can cause serious birth defects. Do not breast-feed while taking this medication and for 1 month after stopping therapy. This medication may cause infertility. Talk to your care team if you are concerned about your fertility. This medication may increase your risk of getting an infection. Call your care team for advice if you get a fever, chills, sore throat, or other symptoms of a cold or flu. Do not treat yourself. Try to avoid being around people who are sick. Avoid taking medications that contain aspirin, acetaminophen , ibuprofen, naproxen, or ketoprofen unless instructed by your care team. These medications may hide a fever. This medication may increase blood sugar. The risk may be higher in patients who already have diabetes. Ask your care team what you can do to lower your risk of diabetes while taking this medication. What side effects may I notice from receiving this medication? Side effects that you should report to your care team as soon as possible: Allergic  reactions--skin rash, itching, hives, swelling of the face, lips, tongue, or throat Infection--fever, chills, cough, or sore throat Infusion reactions--chest pain, shortness of breath or trouble breathing, feeling faint or lightheaded Low red blood cell level--unusual weakness or fatigue, dizziness, headache, trouble breathing Severe or prolonged diarrhea Side effects that usually do not require medical attention (report these to your care team if they continue or are bothersome): Constipation Diarrhea Fatigue Hair loss Loss of appetite Nausea Vomiting This list may not describe all possible side effects. Call your doctor for medical advice about side effects. You may report side effects to FDA at 1-800-FDA-1088. Where should I keep my medication? This medication is given in a hospital or clinic. It will not be stored at home. NOTE: This sheet is a summary. It may not cover all possible information. If you have questions about this medicine, talk to your doctor, pharmacist, or health care provider.  2025 Elsevier/Gold Standard (2023-01-07 00:00:00)  BELOW ARE SYMPTOMS THAT SHOULD BE REPORTED IMMEDIATELY: *FEVER GREATER THAN 100.4 F (38 C) OR HIGHER *CHILLS OR SWEATING *NAUSEA AND VOMITING THAT IS NOT CONTROLLED WITH YOUR NAUSEA MEDICATION *UNUSUAL SHORTNESS OF BREATH *UNUSUAL BRUISING OR BLEEDING *URINARY PROBLEMS (pain or  burning when urinating, or frequent urination) *BOWEL PROBLEMS (unusual diarrhea, constipation, pain near the anus) TENDERNESS IN MOUTH AND THROAT WITH OR WITHOUT PRESENCE OF ULCERS (sore throat, sores in mouth, or a toothache) UNUSUAL RASH, SWELLING OR PAIN  UNUSUAL VAGINAL DISCHARGE OR ITCHING   Items with * indicate a potential emergency and should be followed up as soon as possible or go to the Emergency Department if any problems should occur.  Please show the CHEMOTHERAPY ALERT CARD or IMMUNOTHERAPY ALERT CARD at check-in to the Emergency Department and  triage nurse.  Should you have questions after your visit or need to cancel or reschedule your appointment, please contact [facility] [place] - A DEPT OF [facility] [phone]  and follow the prompts.  Office hours are 8:00 a.m. to 4:30 p.m. Monday - Friday. Please note that voicemails left after 4:00 p.m. may not be returned until the following business day.  We are closed weekends and major holidays. You have access to a nurse at all times for urgent questions. Please call the main number to the clinic [phone] and follow the prompts.  For any non-urgent questions, you may also contact your provider using MyChart. We now offer e-Visits for anyone 14 and older to request care online for non-urgent symptoms. For details visit [email or web address].   Also download the MyChart app! Go to the app store, search MyChart, open the app, select [place], and log in with your MyChart username and password.

## 2023-12-24 NOTE — Progress Notes (Signed)
 Patient presents today for chemotherapy Trodelvy  infusion.  Patient is in satisfactory condition with no new complaints voiced.  Vital signs are stable.  Labs reviewed and all labs are within treatment parameters. Patient will also receive 2g IV magnesium  sulfate per Dr.Kandala's standing orders. We will proceed with treatment per MD orders.    Treatment given today per MD orders. Tolerated infusion without adverse affects. Vital signs stable. No complaints at this time. Discharged from clinic ambulatory in stable condition. Alert and oriented x 3. F/U with [facility] as scheduled.

## 2023-12-25 ENCOUNTER — Inpatient Hospital Stay

## 2023-12-25 VITALS — BP 147/97 | HR 76 | Temp 97.2°F | Resp 18

## 2023-12-25 DIAGNOSIS — C50411 Malignant neoplasm of upper-outer quadrant of right female breast: Secondary | ICD-10-CM

## 2023-12-25 DIAGNOSIS — Z5112 Encounter for antineoplastic immunotherapy: Secondary | ICD-10-CM | POA: Diagnosis not present

## 2023-12-25 LAB — CANCER ANTIGEN 15-3: CA 15-3: 440 U/mL — ABNORMAL HIGH (ref 0.0–25.0)

## 2023-12-25 LAB — CANCER ANTIGEN 27.29: CA 27.29: 582.6 U/mL — ABNORMAL HIGH (ref 0.0–38.6)

## 2023-12-25 MED ORDER — PEGFILGRASTIM-CBQV 6 MG/0.6ML ~~LOC~~ SOSY
6.0000 mg | PREFILLED_SYRINGE | Freq: Once | SUBCUTANEOUS | Status: AC
  Administered 2023-12-25: 6 mg via SUBCUTANEOUS
  Filled 2023-12-25: qty 0.6

## 2023-12-25 NOTE — Progress Notes (Signed)
 Patient tolerated Udenyca injection with no complaints voiced.  Site clean and dry with no bruising or swelling noted at site.  See MAR for details.  Band aid applied.  Patient stable during and after injection.  Vss with discharge and left in satisfactory condition with no s/s of distress noted. All follow ups as scheduled.   Attilio Zeitler [employer]

## 2023-12-25 NOTE — Patient Instructions (Signed)

## 2023-12-28 ENCOUNTER — Encounter: Payer: Self-pay | Admitting: Oncology

## 2023-12-28 ENCOUNTER — Emergency Department (HOSPITAL_COMMUNITY)

## 2023-12-28 ENCOUNTER — Other Ambulatory Visit: Payer: Self-pay

## 2023-12-28 ENCOUNTER — Inpatient Hospital Stay (HOSPITAL_COMMUNITY)
Admission: EM | Admit: 2023-12-28 | Discharge: 2024-01-01 | DRG: 100 | Disposition: A | Discharge status: Home / Self Care | Attending: Internal Medicine | Admitting: Internal Medicine

## 2023-12-28 ENCOUNTER — Encounter (HOSPITAL_COMMUNITY): Payer: Self-pay

## 2023-12-28 ENCOUNTER — Encounter (HOSPITAL_COMMUNITY): Payer: Self-pay | Admitting: Oncology

## 2023-12-28 DIAGNOSIS — Z8249 Family history of ischemic heart disease and other diseases of the circulatory system: Secondary | ICD-10-CM

## 2023-12-28 DIAGNOSIS — C7931 Secondary malignant neoplasm of brain: Secondary | ICD-10-CM | POA: Diagnosis present

## 2023-12-28 DIAGNOSIS — F40240 Claustrophobia: Secondary | ICD-10-CM | POA: Diagnosis present

## 2023-12-28 DIAGNOSIS — C799 Secondary malignant neoplasm of unspecified site: Secondary | ICD-10-CM

## 2023-12-28 DIAGNOSIS — Z8711 Personal history of peptic ulcer disease: Secondary | ICD-10-CM

## 2023-12-28 DIAGNOSIS — E871 Hypo-osmolality and hyponatremia: Secondary | ICD-10-CM | POA: Diagnosis present

## 2023-12-28 DIAGNOSIS — G9341 Metabolic encephalopathy: Secondary | ICD-10-CM | POA: Diagnosis present

## 2023-12-28 DIAGNOSIS — Z170 Estrogen receptor positive status [ER+]: Secondary | ICD-10-CM

## 2023-12-28 DIAGNOSIS — C787 Secondary malignant neoplasm of liver and intrahepatic bile duct: Secondary | ICD-10-CM | POA: Diagnosis present

## 2023-12-28 DIAGNOSIS — C7951 Secondary malignant neoplasm of bone: Secondary | ICD-10-CM | POA: Diagnosis present

## 2023-12-28 DIAGNOSIS — Z8719 Personal history of other diseases of the digestive system: Secondary | ICD-10-CM

## 2023-12-28 DIAGNOSIS — F17210 Nicotine dependence, cigarettes, uncomplicated: Secondary | ICD-10-CM | POA: Diagnosis present

## 2023-12-28 DIAGNOSIS — E860 Dehydration: Secondary | ICD-10-CM | POA: Diagnosis present

## 2023-12-28 DIAGNOSIS — Z1721 Progesterone receptor positive status: Secondary | ICD-10-CM

## 2023-12-28 DIAGNOSIS — D630 Anemia in neoplastic disease: Secondary | ICD-10-CM | POA: Diagnosis present

## 2023-12-28 DIAGNOSIS — G40409 Other generalized epilepsy and epileptic syndromes, not intractable, without status epilepticus: Principal | ICD-10-CM | POA: Diagnosis present

## 2023-12-28 DIAGNOSIS — N1330 Unspecified hydronephrosis: Secondary | ICD-10-CM | POA: Diagnosis present

## 2023-12-28 DIAGNOSIS — E876 Hypokalemia: Secondary | ICD-10-CM | POA: Diagnosis present

## 2023-12-28 DIAGNOSIS — E119 Type 2 diabetes mellitus without complications: Secondary | ICD-10-CM | POA: Diagnosis present

## 2023-12-28 DIAGNOSIS — R569 Unspecified convulsions: Principal | ICD-10-CM

## 2023-12-28 DIAGNOSIS — Z833 Family history of diabetes mellitus: Secondary | ICD-10-CM

## 2023-12-28 DIAGNOSIS — R310 Gross hematuria: Secondary | ICD-10-CM | POA: Diagnosis not present

## 2023-12-28 DIAGNOSIS — Z90710 Acquired absence of both cervix and uterus: Secondary | ICD-10-CM

## 2023-12-28 DIAGNOSIS — Z860101 Personal history of adenomatous and serrated colon polyps: Secondary | ICD-10-CM

## 2023-12-28 DIAGNOSIS — C50911 Malignant neoplasm of unspecified site of right female breast: Secondary | ICD-10-CM | POA: Diagnosis present

## 2023-12-28 DIAGNOSIS — Z781 Physical restraint status: Secondary | ICD-10-CM

## 2023-12-28 DIAGNOSIS — C7949 Secondary malignant neoplasm of other parts of nervous system: Secondary | ICD-10-CM | POA: Diagnosis present

## 2023-12-28 LAB — I-STAT CHEM 8, ED
BUN: 7 mg/dL (ref 6–20)
Calcium, Ion: 1.19 mmol/L (ref 1.15–1.40)
Chloride: 104 mmol/L (ref 98–111)
Creatinine, Ser: 0.6 mg/dL (ref 0.44–1.00)
Glucose, Bld: 149 mg/dL — ABNORMAL HIGH (ref 70–99)
HCT: 34 % — ABNORMAL LOW (ref 36.0–46.0)
Hemoglobin: 11.6 g/dL — ABNORMAL LOW (ref 12.0–15.0)
Potassium: 3.2 mmol/L — ABNORMAL LOW (ref 3.5–5.1)
Sodium: 141 mmol/L (ref 135–145)
TCO2: 18 mmol/L — ABNORMAL LOW (ref 22–32)

## 2023-12-28 LAB — CBC WITH DIFFERENTIAL/PLATELET
Basophils Absolute: 0 K/uL (ref 0.0–0.1)
Basophils Relative: 0 %
Eosinophils Absolute: 0.2 K/uL (ref 0.0–0.5)
Eosinophils Relative: 2 %
HCT: 35.3 % — ABNORMAL LOW (ref 36.0–46.0)
Hemoglobin: 11.1 g/dL — ABNORMAL LOW (ref 12.0–15.0)
Lymphocytes Relative: 18 %
Lymphs Abs: 2.1 K/uL (ref 0.7–4.0)
MCH: 31.3 pg (ref 26.0–34.0)
MCHC: 31.4 g/dL (ref 30.0–36.0)
MCV: 99.4 fL (ref 80.0–100.0)
Monocytes Absolute: 0.7 K/uL (ref 0.1–1.0)
Monocytes Relative: 6 %
Neutro Abs: 8.6 K/uL — ABNORMAL HIGH (ref 1.7–7.7)
Neutrophils Relative %: 74 %
Platelets: 231 K/uL (ref 150–400)
RBC: 3.55 MIL/uL — ABNORMAL LOW (ref 3.87–5.11)
RDW: 16.5 % — ABNORMAL HIGH (ref 11.5–15.5)
WBC: 11.6 K/uL — ABNORMAL HIGH (ref 4.0–10.5)
nRBC: 0.3 % — ABNORMAL HIGH (ref 0.0–0.2)

## 2023-12-28 LAB — COMPREHENSIVE METABOLIC PANEL WITH GFR
ALT: 21 U/L (ref 0–44)
AST: 32 U/L (ref 15–41)
Albumin: 3.9 g/dL (ref 3.5–5.0)
Alkaline Phosphatase: 258 U/L — ABNORMAL HIGH (ref 38–126)
Anion gap: 18 — ABNORMAL HIGH (ref 5–15)
BUN: 8 mg/dL (ref 6–20)
CO2: 20 mmol/L — ABNORMAL LOW (ref 22–32)
Calcium: 9.2 mg/dL (ref 8.9–10.3)
Chloride: 103 mmol/L (ref 98–111)
Creatinine, Ser: 0.56 mg/dL (ref 0.44–1.00)
GFR, Estimated: >60 mL/min (ref >60)
Glucose, Bld: 148 mg/dL — ABNORMAL HIGH (ref 70–99)
Potassium: 3.2 mmol/L — ABNORMAL LOW (ref 3.5–5.1)
Sodium: 140 mmol/L (ref 135–145)
Total Bilirubin: <0.2 mg/dL (ref 0.0–1.2)
Total Protein: 6.3 g/dL — ABNORMAL LOW (ref 6.5–8.1)

## 2023-12-28 LAB — ETHANOL: Alcohol, Ethyl (B): <15 mg/dL (ref <15)

## 2023-12-28 LAB — MAGNESIUM: Magnesium: 1.9 mg/dL (ref 1.7–2.4)

## 2023-12-28 LAB — CBG MONITORING, ED: Glucose-Capillary: 162 mg/dL — ABNORMAL HIGH (ref 70–99)

## 2023-12-28 MED ORDER — LEVETIRACETAM (KEPPRA) 500 MG/5 ML ADULT IV PUSH
2500.0000 mg | Freq: Once | INTRAVENOUS | Status: AC
  Administered 2023-12-28: 2500 mg via INTRAVENOUS
  Filled 2023-12-28: qty 25

## 2023-12-28 MED ORDER — POTASSIUM CHLORIDE 10 MEQ/100ML IV SOLN
10.0000 meq | INTRAVENOUS | Status: AC
  Administered 2023-12-28 (×2): 10 meq via INTRAVENOUS
  Filled 2023-12-28 (×2): qty 100

## 2023-12-28 MED ORDER — POTASSIUM CHLORIDE IN NACL 20-0.9 MEQ/L-% IV SOLN: INTRAVENOUS | Status: AC

## 2023-12-28 MED ORDER — SODIUM CHLORIDE 0.9 % IV SOLN: INTRAVENOUS | Status: DC

## 2023-12-28 MED ORDER — LORAZEPAM 2 MG/ML IJ SOLN
2.0000 mg | Freq: Once | INTRAMUSCULAR | Status: AC
  Administered 2023-12-28: 2 mg via INTRAMUSCULAR

## 2023-12-28 MED ORDER — ENOXAPARIN SODIUM 40 MG/0.4ML IJ SOSY: 40.0000 mg | PREFILLED_SYRINGE | INTRAMUSCULAR | Status: DC

## 2023-12-28 MED ORDER — LORAZEPAM 2 MG/ML IJ SOLN
INTRAMUSCULAR | Status: AC
  Filled 2023-12-28: qty 1

## 2023-12-28 MED ORDER — LORAZEPAM 2 MG/ML IJ SOLN
INTRAMUSCULAR | Status: AC
  Administered 2023-12-28: 1 mg
  Filled 2023-12-28: qty 1

## 2023-12-28 MED ORDER — SODIUM CHLORIDE 0.9 % IV BOLUS
1000.0000 mL | Freq: Once | INTRAVENOUS | Status: AC
  Administered 2023-12-28: 1000 mL via INTRAVENOUS

## 2023-12-28 NOTE — Plan of Care (Signed)
 Brief Neuro Note:  Briefly, [name] is a 58 y.o. female with metastatic breast cancer p/w first time witnessed GTC seizure. Got ativan  2mg  and then additional ativan  2mg  for agitation. She is confused, oriente dx 2 and neuro exam is non focal per discussion with team.  Ct head w/o contrast is negative.  I discussed with Dr. Elnor over phone. It is very odd to have a first time seizure at her age. She is not on any meds that could cause a seizure. She does not have significant electrolyte abnormalities.  I would recommend that we get MRI Brain with and without contrast. The reason I think it would be helpful is that CT is not sensitive for small brain mets, specially if they do not have a lot of vasogenic edema. I am worried that she could potentially have a brain met that could be causing her seizures. If we do identify a brain met that could be a possible source/focus for seizures, then would change management and we will need to do longterm Aeds.  Recommend: - transfer to [facility] for MRI Brain with and without contrast, epilepsy protocol and brain met protocol. - load with Keppra  1500mg  IV once. Hold off on maintenance Keppra  for now. - routine EEG in AM. This can also be obtained outpatient. - Observe overnight for seizure clustering. - No driving for 6 months. Has to be seizure free before she can resume driving.  Jaime Dome [facility]

## 2023-12-28 NOTE — H&P (Incomplete)
 History and Physical  Monica Soto FMW:[id number] DOB: 12-30-1965 DOA: 12/28/2023  Referring physician: Dr. Elnor, EDP  PCP: Monica Rush, MD  Outpatient Specialists: Medical oncology. Patient coming from: Home.  Chief Complaint: Seizure-like activity.  HPI: Monica Soto is a 58 y.o. female with medical history significant for       ED Course: ***  Review of Systems: Review of systems as noted in the HPI. All other systems reviewed and are negative.   Past Medical History:  Diagnosis Date  . Depression   . Diabetes mellitus without complication (HCC)   . Erosive esophagitis 04/26/2010   EGD by Dr. Shaaron, small hiatal hernia  . GERD (gastroesophageal reflux disease)   . History of stomach ulcers   . IBS (irritable bowel syndrome)    Diarrhea predominant  . Microscopic colitis 04/26/2010   Colonoscopy by Dr. Shaaron, good response with Entocort  . Tubular adenoma of colon 04/26/2010   Junction of descending and sigmoid 40 CM from anus   Past Surgical History:  Procedure Laterality Date  . ABDOMINAL HYSTERECTOMY    . APPENDECTOMY  2/11   Dr. Floria with a delayed closure  . BREAST BIOPSY Right 06/22/2022   US  RT BREAST BX W LOC DEV 1ST LESION IMG BX SPEC US  GUIDE 06/22/2022 [facility] MAMMOGRAPHY  . PORTACATH PLACEMENT N/A 07/16/2022   Procedure: INSERTION PORT-A-CATH;  Surgeon: [name], MD;  Location: [facility];  Service: General;  Laterality: N/A;  . TONSILLECTOMY    . TUBAL LIGATION      Social History:  reports that she has been smoking cigarettes. She has never used smokeless tobacco. She reports current alcohol use of about 1.0 standard drink of alcohol per week. She reports that she does not use drugs.   No Known Allergies  Family History  Problem Relation Age of Onset  . Diabetes Mother   . Coronary artery disease Mother   . Healthy Father     ***  Prior to Admission medications   Medication Sig Start Date End Date Taking?  Authorizing Provider  Aspirin-Acetaminophen -Caffeine (GOODY HEADACHE PO) Take 1 packet by mouth 2 (two) times daily as needed (for pain).    [provider]  Cholecalciferol 1.25 MG (50000 UT) capsule Take 1 capsule by mouth once a week. 06/07/22   [provider]  diphenoxylate -atropine  (LOMOTIL ) 2.5-0.025 MG tablet Take 2 tablets after first loose stool and 1 after each loose stool thereafter.  Do not exceed 8 tablets in 1 day. 05/20/23   [name], MD  fluticasone  (FLONASE ) 50 MCG/ACT nasal spray Place 1 spray into both nostrils daily. 03/25/23   Chandra Harlene LABOR, NP  fluticasone  (FLONASE ) 50 MCG/ACT nasal spray Place 1 spray into both nostrils daily. 09/23/23   Kandala, Hyndavi, MD  furosemide  (LASIX ) 20 MG tablet TAKE 1 TABLET(20 MG) BY MOUTH DAILY AS NEEDED FOR SWELLING 07/31/23   [name], MD  HYDROcodone -acetaminophen  (NORCO/VICODIN) 5-325 MG tablet Take 1 tablet by mouth every 6 (six) hours as needed for moderate pain. 07/16/22   [name], MD  lidocaine -prilocaine  (EMLA ) cream APPLY TOPICALLY TO THE AFFECTED AREA 1 TIME 07/24/23   Iruku, Praveena, MD  magnesium  oxide (MAG-OX) 400 (240 Mg) MG tablet Take 1 tablet (400 mg total) by mouth 2 (two) times daily. 04/04/23   [name], MD  ondansetron  (ZOFRAN ) 8 MG tablet TAKE 1 TABLET(8 MG) BY MOUTH EVERY 8 HOURS AS NEEDED FOR NAUSEA OR VOMITING. START THIRD DAY AFTER DOXORUBICIN/ CYCLOPHOSPHAMIDE CHEMOTHERAPY  08/13/23   [name], MD  oxyCODONE  (OXY IR/ROXICODONE ) 5 MG immediate release tablet Take 1 tablet (5 mg total) by mouth 2 (two) times daily as needed for severe pain (pain score 7-10). 11/26/23   Kandala, Hyndavi, MD  pantoprazole  (PROTONIX ) 40 MG tablet Take 1 tablet (40 mg total) by mouth daily. 09/23/23   Kandala, Hyndavi, MD  prochlorperazine  (COMPAZINE ) 10 MG tablet Take 1 tablet (10 mg total) by mouth every 6 (six) hours as needed for nausea or vomiting. 07/23/23   [name], MD  albuterol  (PROVENTIL  HFA;VENTOLIN  HFA) 108 (90 Base) MCG/ACT inhaler Inhale 2 puffs into the lungs every 4 (four) hours as needed for wheezing or shortness of breath. Patient not taking: Reported on 01/11/2016 04/28/15 09/11/18  Tharon Lenis, NP    Physical Exam: BP (!) 156/79   Pulse 98   Temp 98.2 F (36.8 C) (Rectal)   Resp 19   Wt 70.8 kg   SpO2 97%   BMI 25.97 kg/m   General: 58 y.o. year-old female well developed well nourished in no acute distress.  Alert and oriented x3. Cardiovascular: Regular rate and rhythm with no rubs or gallops.  No thyromegaly or JVD noted.  No lower extremity edema. 2/4 pulses in all 4 extremities. Respiratory: Clear to auscultation with no wheezes or rales. Good inspiratory effort. Abdomen: Soft nontender nondistended with normal bowel sounds x4 quadrants. Muskuloskeletal: No cyanosis, clubbing or edema noted bilaterally Neuro: CN II-XII intact, strength, sensation, reflexes Skin: No ulcerative lesions noted or rashes Psychiatry: Judgement and insight appear normal. Mood is appropriate for condition and setting          Labs on Admission:  Basic Metabolic Panel: Recent Labs  Lab 12/24/23 0811 12/28/23 2023 12/28/23 2057  NA 139 140 141  K 3.6 3.2* 3.2*  CL 104 103 104  CO2 22 20*  --   GLUCOSE 135* 148* 149*  BUN 9 8 7   CREATININE 0.56 0.56 0.60  CALCIUM 8.7* 9.2  --   MG 1.6* 1.9  --   PHOS 3.8  --   --    Liver Function Tests: Recent Labs  Lab 12/24/23 0811 12/28/23 2023  AST 28 32  ALT 16 21  ALKPHOS 210* 258*  BILITOT <0.2 <0.2  PROT 6.3* 6.3*  ALBUMIN 3.8 3.9   No results for input(s): LIPASE, AMYLASE in the last 168 hours. No results for input(s): AMMONIA in the last 168 hours. CBC: Recent Labs  Lab 12/24/23 0811 12/28/23 2023 12/28/23 2057  WBC 4.5 11.6*  --   NEUTROABS 2.9 8.6*  --   HGB 10.8* 11.1* 11.6*  HCT 34.7* 35.3* 34.0*  MCV 98.6 99.4  --   PLT 225 231  --    Cardiac Enzymes: No  results for input(s): CKTOTAL, CKMB, CKMBINDEX, TROPONINI in the last 168 hours.  BNP (last 3 results) No results for input(s): BNP in the last 8760 hours.  ProBNP (last 3 results) No results for input(s): PROBNP in the last 8760 hours.  CBG: Recent Labs  Lab 12/28/23 2031  GLUCAP 162*    Radiological Exams on Admission: CT Head Wo Contrast Result Date: 12/28/2023 EXAM: CT HEAD WITHOUT CONTRAST 12/28/2023 08:43:23 PM TECHNIQUE: CT of the head was performed without the administration of intravenous contrast. Automated exposure control, iterative reconstruction, and/or weight based adjustment of the mA/kV was utilized to reduce the radiation dose to as low as reasonably achievable. COMPARISON: 05/03/2011 CLINICAL HISTORY: Seizure, new-onset, no history of trauma. FINDINGS:  BRAIN AND VENTRICLES: No acute hemorrhage. No evidence of acute infarct. No extra-axial collection. No mass effect or midline shift. ORBITS: No acute abnormality. SINUSES: No acute abnormality. SOFT TISSUES AND SKULL: No acute soft tissue abnormality. No skull fracture. IMPRESSION: 1. No acute intracranial abnormality. Electronically signed by: Oneil Devonshire MD 12/28/2023 08:51 PM EST RP Workstation: [id number]    EKG: I independently viewed the EKG done and my findings are as followed: ***   Assessment/Plan Present on Admission: **None**  Principal Problem:   New onset seizure (HCC)   DVT prophylaxis: ***   Code Status: ***   Family Communication: ***   Disposition Plan: ***   Consults called: ***   Admission status: ***    Status is: Inpatient {Inpatient:23812}   Terry LOISE Hurst MD [facility] Pager [phone]  If 7PM-7AM, please contact night-coverage [email or web address] Password [facility]  12/28/2023, 10:55 PM

## 2023-12-28 NOTE — ED Triage Notes (Signed)
 Pt has been undergoing breast cancer treatment for a year.  Husband reports he saw her at 3 pm and she was normal but when he returned at 530 she seemed off and her motor coordination was off.  Pt was in a wheel chair and began to stare to the right and eyes were twitching then her arms began to shake and and legs began to posture and shake lasting about 30 seconds.

## 2023-12-28 NOTE — ED Notes (Signed)
 Patient's daughter reports patient has been having episodes of diarrhea at home and she thinks that patient may have had some.  Patient checked, noted to be clean, brief applied just in case.

## 2023-12-28 NOTE — H&P (Incomplete)
 History and Physical  Monica Soto FMW:[id number] DOB: 24-Aug-1965 DOA: 12/28/2023  Referring physician: Dr. Elnor, EDP  PCP: [name], MD  Outpatient Specialists: Medical oncology. Patient coming from: Home.  Chief Complaint: Seizure-like activity.  HPI: Monica Soto is a 58 y.o. 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  Family endorses she has had diarrhea x 4 days.  Associated with nausea without vomiting.  Also with fatigue and altered mental status today.  Her husband decided to bring her to the ER for further evaluation.    While he was getting her into the car he noticed that her eyes were fluttering and she had a blank stare, lasting a few seconds.  After they arrived to the ER, around 7 PM, she screamed while holding her abdomen.  Her husband asked for help and she was put in a wheelchair.  In the wheelchair, the patient stared to the right and eyes were twitching, her arms and legs began to shake, lasting about 30 seconds, witnessed by nursing staff.    The patient received 6 mg of IV Ativan  total in the ED.  EDP discussed the case with neurology who recommended Keppra  load 2.5 g x 1.  Neurology requested admission to [facility] for further seizure workup and management.  Admitted by [facility], hospitalist service.  The patient became agitated in the ER, attempting to get out of bed, pulling at lines.  After receiving 6 mg of IV Ativan , IV Valproic  acid 500 mg x 1 added for agitation in the setting of new onset seizure.  Brief soft belt restraint until IV Valproic  acid takes effect for patient's own safety, then will promptly discontinue restraints.  ED  Course: Temperature 98.2.  BP 160/63, pulse 93, respiration rate 20, O2 saturation 94% on room air.  Review of Systems: Review of systems as noted in the HPI. All other systems reviewed and are negative.   Past Medical History:  Diagnosis Date   Depression    Diabetes mellitus without complication (HCC)    Erosive esophagitis 04/26/2010   EGD by Dr. Shaaron, small hiatal hernia   GERD (gastroesophageal reflux disease)    History of stomach ulcers    IBS (irritable bowel syndrome)    Diarrhea predominant   Microscopic colitis 04/26/2010   Colonoscopy by Dr. Shaaron, good response with Entocort   Tubular adenoma of colon 04/26/2010   Junction of descending and sigmoid 40 CM from anus   Past Surgical History:  Procedure Laterality Date   ABDOMINAL HYSTERECTOMY     APPENDECTOMY  2/11   Dr. Floria with a delayed closure   BREAST BIOPSY Right 06/22/2022   US  RT BREAST BX W LOC DEV 1ST LESION IMG BX SPEC US  GUIDE 06/22/2022 [facility] MAMMOGRAPHY   PORTACATH PLACEMENT N/A 07/16/2022   Procedure: INSERTION PORT-A-CATH;  Surgeon: [name], MD;  Location: [facility];  Service: General;  Laterality: N/A;   TONSILLECTOMY     TUBAL LIGATION      Social History:  reports that she has been smoking cigarettes. She has never used smokeless tobacco. She reports current alcohol use of about 1.0 standard drink of alcohol per week. She reports that  she does not use drugs.   No Known Allergies  Family History  Problem Relation Age of Onset   Diabetes Mother    Coronary artery disease Mother    Healthy Father       Prior to Admission medications   Medication Sig Start Date End Date Taking? Authorizing Provider  Aspirin-Acetaminophen -Caffeine (GOODY HEADACHE PO) Take 1 packet by mouth 2 (two) times daily as needed (for pain).    [provider]  Cholecalciferol 1.25 MG (50000 UT) capsule Take 1 capsule by mouth once a week. 06/07/22   [provider]   diphenoxylate -atropine  (LOMOTIL ) 2.5-0.025 MG tablet Take 2 tablets after first loose stool and 1 after each loose stool thereafter.  Do not exceed 8 tablets in 1 day. 05/20/23   [name], MD  fluticasone  (FLONASE ) 50 MCG/ACT nasal spray Place 1 spray into both nostrils daily. 03/25/23   Chandra Harlene LABOR, NP  fluticasone  (FLONASE ) 50 MCG/ACT nasal spray Place 1 spray into both nostrils daily. 09/23/23   Kandala, Hyndavi, MD  furosemide  (LASIX ) 20 MG tablet TAKE 1 TABLET(20 MG) BY MOUTH DAILY AS NEEDED FOR SWELLING 07/31/23   [name], MD  HYDROcodone -acetaminophen  (NORCO/VICODIN) 5-325 MG tablet Take 1 tablet by mouth every 6 (six) hours as needed for moderate pain. 07/16/22   [name], MD  lidocaine -prilocaine  (EMLA ) cream APPLY TOPICALLY TO THE AFFECTED AREA 1 TIME 07/24/23   Iruku, Praveena, MD  magnesium  oxide (MAG-OX) 400 (240 Mg) MG tablet Take 1 tablet (400 mg total) by mouth 2 (two) times daily. 04/04/23   [name], MD  ondansetron  (ZOFRAN ) 8 MG tablet TAKE 1 TABLET(8 MG) BY MOUTH EVERY 8 HOURS AS NEEDED FOR NAUSEA OR VOMITING. START THIRD DAY AFTER DOXORUBICIN/ CYCLOPHOSPHAMIDE CHEMOTHERAPY 08/13/23   [name], MD  oxyCODONE  (OXY IR/ROXICODONE ) 5 MG immediate release tablet Take 1 tablet (5 mg total) by mouth 2 (two) times daily as needed for severe pain (pain score 7-10). 11/26/23   [name], MD  pantoprazole  (PROTONIX ) 40 MG tablet Take 1 tablet (40 mg total) by mouth daily. 09/23/23   Kandala, Hyndavi, MD  prochlorperazine  (COMPAZINE ) 10 MG tablet Take 1 tablet (10 mg total) by mouth every 6 (six) hours as needed for nausea or vomiting. 07/23/23   [name], MD  albuterol  (PROVENTIL  HFA;VENTOLIN  HFA) 108 (90 Base) MCG/ACT inhaler Inhale 2 puffs into the lungs every 4 (four) hours as needed for wheezing or shortness of breath. Patient not taking: Reported on 01/11/2016 04/28/15 09/11/18  Tharon Lenis, NP    Physical Exam: BP (!)  156/79   Pulse 98   Temp 98.2 F (36.8 C) (Rectal)   Resp 19   Wt 70.8 kg   SpO2 97%   BMI 25.97 kg/m   General: 58 y.o. year-old female well developed well nourished in no acute distress.  Somnolent.  Became agitated shortly after.   Cardiovascular: Regular rate and rhythm with no rubs or gallops.  No thyromegaly or JVD noted.  Trace lower extremity edema bilaterally.  Respiratory: Clear to auscultation with no wheezes or rales. Good inspiratory effort. Abdomen: Soft nontender nondistended with normal bowel sounds x4 quadrants. Muskuloskeletal: No cyanosis or clubbing noted bilaterally Neuro: CN II-XII intact, strength, sensation, reflexes Skin: Right breast scarring with wound Psychiatry: Unable to assess judgement and mood due to altered mental status.          Labs on Admission:  Basic Metabolic Panel: Recent Labs  Lab 12/24/23 0811 12/28/23 2023 12/28/23 2057  NA  139 140 141  K 3.6 3.2* 3.2*  CL 104 103 104  CO2 22 20*  --   GLUCOSE 135* 148* 149*  BUN 9 8 7   CREATININE 0.56 0.56 0.60  CALCIUM 8.7* 9.2  --   MG 1.6* 1.9  --   PHOS 3.8  --   --    Liver Function Tests: Recent Labs  Lab 12/24/23 0811 12/28/23 2023  AST 28 32  ALT 16 21  ALKPHOS 210* 258*  BILITOT <0.2 <0.2  PROT 6.3* 6.3*  ALBUMIN 3.8 3.9   No results for input(s): LIPASE, AMYLASE in the last 168 hours. No results for input(s): AMMONIA in the last 168 hours. CBC: Recent Labs  Lab 12/24/23 0811 12/28/23 2023 12/28/23 2057  WBC 4.5 11.6*  --   NEUTROABS 2.9 8.6*  --   HGB 10.8* 11.1* 11.6*  HCT 34.7* 35.3* 34.0*  MCV 98.6 99.4  --   PLT 225 231  --    Cardiac Enzymes: No results for input(s): CKTOTAL, CKMB, CKMBINDEX, TROPONINI in the last 168 hours.  BNP (last 3 results) No results for input(s): BNP in the last 8760 hours.  ProBNP (last 3 results) No results for input(s): PROBNP in the last 8760 hours.  CBG: Recent Labs  Lab 12/28/23 2031  GLUCAP 162*     Radiological Exams on Admission: CT Head Wo Contrast Result Date: 12/28/2023 EXAM: CT HEAD WITHOUT CONTRAST 12/28/2023 08:43:23 PM TECHNIQUE: CT of the head was performed without the administration of intravenous contrast. Automated exposure control, iterative reconstruction, and/or weight based adjustment of the mA/kV was utilized to reduce the radiation dose to as low as reasonably achievable. COMPARISON: 05/03/2011 CLINICAL HISTORY: Seizure, new-onset, no history of trauma. FINDINGS: BRAIN AND VENTRICLES: No acute hemorrhage. No evidence of acute infarct. No extra-axial collection. No mass effect or midline shift. ORBITS: No acute abnormality. SINUSES: No acute abnormality. SOFT TISSUES AND SKULL: No acute soft tissue abnormality. No skull fracture. IMPRESSION: 1. No acute intracranial abnormality. Electronically signed by: Oneil Devonshire MD 12/28/2023 08:51 PM EST RP Workstation: [id number]    EKG: I independently viewed the EKG done and my findings are as followed: Sinus tachycardia rate of 120.  QTc 474.  Assessment/Plan Present on Admission: **None**  Principal Problem:   New onset seizure (HCC)  New onset seizure, unclear trigger Follow MRI brain with and without contrast Follow EEG Follow UDS Seizure precautions As needed IV Ativan  for breakthrough seizures Transfer to [facility] at the request of neurology team.  Neurology consulted by EDP.  Acute metabolic encephalopathy in the setting of new onset seizure Treat underlying conditions Follow UDS 1 dose of IV valproic  acid 500 mg x 1 given for agitation in the setting of new onset seizure. Fall precautions Aspiration precautions. Brief soft belt restraint until IV Valproic  acid takes effect for patient's own safety, then will promptly discontinue restraints.  Right chest scarring from mastectomy with wound and concern for infection, POA Wound care specialist consulted Local wound care with the guidance of wound  care specialist. Cover with IV vancomycin  and Rocephin  in immunocompromise state. Recent chemotherapy 3 days ago. Monitor fever curve and WBCs.  Right breast cancer with metastasis to bones and liver, on chemotherapy On Trodelvy  Outpatient follow-up with medical oncology.  Diarrhea, suspect from Trodelvy  Reportedly the patient was having diarrhea days prior to admission Continue IV fluid hydration. Monitor electrolytes and replete as indicated.  Hypokalemia Serum potassium 3.2 Repleted intravenously Repeat BMP Follow magnesium  level and  replete electrolytes as indicated.  Elevated alkaline phosphatase in the setting of bone metastasis Alkaline phosphatase 258 Monitor for now  Tobacco use Nicotine  patch   Critical care time: 55 minutes.   DVT prophylaxis: Subcu Lovenox  daily.  Code Status: Full code.  Family Communication: Daughter and husband at bedside.  Disposition Plan: Admitted to progressive care unit at [facility]  Consults called: Neurology.  Admission status: Inpatient status.    Status is: Inpatient The patient requires at least 2 midnights for further evaluation and treatment of present condition.   Terry LOISE Hurst MD [facility] Pager [phone]  If 7PM-7AM, please contact night-coverage [email or web address] Password [facility]  12/28/2023, 10:55 PM

## 2023-12-28 NOTE — ED Provider Notes (Signed)
 [place] EMERGENCY DEPARTMENT AT [facility] Provider Note  CSN: [id number] Arrival date & time: 12/28/23 1958  Chief Complaint(s) Seizures  HPI Monica Soto is a 58 y.o. female with past medical history as below, significant for DM, GERD, IBS, metastatic breast cancer who presents to the ED with complaint of AMS, seizure  Pt here w/ family, report she was feeling unwell around 5pm, was feeling weak, felt like her leg was hurting. Had some diarrhea earlier w/o vomiting or abd pain. Reporting headache, some confusion. While in the lobby checking in pt slumped to the ground from her chair and had witnessed generalized tonic clonic seizure, she was cyanotic. Seizure lasted around 1 minute. Pt was given ativan  2mg  IM, seizure aborted. Pt acutely agitated, combative, appears post ictal. Was given another 1mg  ativan  and pt is more calm, no longer trying to climb out of the bed.   F/w Dr Davonna, per family pt receiving chemotherapy, last infusion 11/19; she is prescribed Trodelvy , Zometa ; reportedly tolerated well   Past Medical History Past Medical History:  Diagnosis Date   Depression    Diabetes mellitus without complication (HCC)    Erosive esophagitis 04/26/2010   EGD by Dr. Shaaron, small hiatal hernia   GERD (gastroesophageal reflux disease)    History of stomach ulcers    IBS (irritable bowel syndrome)    Diarrhea predominant   Microscopic colitis 04/26/2010   Colonoscopy by Dr. Shaaron, good response with Entocort   Tubular adenoma of colon 04/26/2010   Junction of descending and sigmoid 40 CM from anus   Patient Active Problem List   Diagnosis Date Noted   New onset seizure (HCC) 12/28/2023   Macrocytic anemia 10/15/2023   Cough 09/24/2023   Metastasis to bone (HCC) 09/24/2023   Breast wound 09/24/2023   Neutropenia, drug-induced 06/17/2023   Genetic testing 07/12/2022   Malignant neoplasm of upper-outer quadrant of right breast in female, estrogen receptor  positive (HCC) 07/03/2022   Lymphocytic colitis 05/30/2010   GERD 04/17/2010   IRRITABLE BOWEL SYNDROME 02/14/2010   Diarrhea 02/14/2010   Abdominal pain 02/14/2010   Home Medication(s) Prior to Admission medications   Medication Sig Start Date End Date Taking? Authorizing Provider  Aspirin-Acetaminophen -Caffeine (GOODY HEADACHE PO) Take 1 packet by mouth 2 (two) times daily as needed (for pain).    [provider]  Cholecalciferol 1.25 MG (50000 UT) capsule Take 1 capsule by mouth once a week. 06/07/22   [provider]  diphenoxylate -atropine  (LOMOTIL ) 2.5-0.025 MG tablet Take 2 tablets after first loose stool and 1 after each loose stool thereafter.  Do not exceed 8 tablets in 1 day. 05/20/23   [name], MD  fluticasone  (FLONASE ) 50 MCG/ACT nasal spray Place 1 spray into both nostrils daily. 03/25/23   Chandra Harlene LABOR, NP  fluticasone  (FLONASE ) 50 MCG/ACT nasal spray Place 1 spray into both nostrils daily. 09/23/23   Kandala, Hyndavi, MD  furosemide  (LASIX ) 20 MG tablet TAKE 1 TABLET(20 MG) BY MOUTH DAILY AS NEEDED FOR SWELLING 07/31/23   [name], MD  HYDROcodone -acetaminophen  (NORCO/VICODIN) 5-325 MG tablet Take 1 tablet by mouth every 6 (six) hours as needed for moderate pain. 07/16/22   [name], MD  lidocaine -prilocaine  (EMLA ) cream APPLY TOPICALLY TO THE AFFECTED AREA 1 TIME 07/24/23   Iruku, Praveena, MD  magnesium  oxide (MAG-OX) 400 (240 Mg) MG tablet Take 1 tablet (400 mg total) by mouth 2 (two) times daily. 04/04/23   [name], MD  ondansetron  (ZOFRAN ) 8 MG  tablet TAKE 1 TABLET(8 MG) BY MOUTH EVERY 8 HOURS AS NEEDED FOR NAUSEA OR VOMITING. START THIRD DAY AFTER DOXORUBICIN/ CYCLOPHOSPHAMIDE CHEMOTHERAPY 08/13/23   [name], MD  oxyCODONE  (OXY IR/ROXICODONE ) 5 MG immediate release tablet Take 1 tablet (5 mg total) by mouth 2 (two) times daily as needed for severe pain (pain score 7-10). 11/26/23   Kandala, Hyndavi, MD   pantoprazole  (PROTONIX ) 40 MG tablet Take 1 tablet (40 mg total) by mouth daily. 09/23/23   Kandala, Hyndavi, MD  prochlorperazine  (COMPAZINE ) 10 MG tablet Take 1 tablet (10 mg total) by mouth every 6 (six) hours as needed for nausea or vomiting. 07/23/23   [name], MD  albuterol  (PROVENTIL  HFA;VENTOLIN  HFA) 108 (90 Base) MCG/ACT inhaler Inhale 2 puffs into the lungs every 4 (four) hours as needed for wheezing or shortness of breath. Patient not taking: Reported on 01/11/2016 04/28/15 09/11/18  Tharon Lenis, NP                                                                                                                                    Past Surgical History Past Surgical History:  Procedure Laterality Date   ABDOMINAL HYSTERECTOMY     APPENDECTOMY  2/11   Dr. Floria with a delayed closure   BREAST BIOPSY Right 06/22/2022   US  RT BREAST BX W LOC DEV 1ST LESION IMG BX SPEC US  GUIDE 06/22/2022 [facility] MAMMOGRAPHY   PORTACATH PLACEMENT N/A 07/16/2022   Procedure: INSERTION PORT-A-CATH;  Surgeon: [name], MD;  Location: [facility];  Service: General;  Laterality: N/A;   TONSILLECTOMY     TUBAL LIGATION     Family History Family History  Problem Relation Age of Onset   Diabetes Mother    Coronary artery disease Mother    Healthy Father     Social History Social History   Tobacco Use   Smoking status: Every Day    Current packs/day: 0.50    Types: Cigarettes   Smokeless tobacco: Never  Vaping Use   Vaping status: Never Used  Substance Use Topics   Alcohol use: Yes    Alcohol/week: 1.0 standard drink of alcohol    Types: 1 Glasses of wine per week    Comment: seldom   Drug use: No   Allergies Patient has no known allergies.  Review of Systems A thorough review of systems was obtained and all systems are negative except as noted in the HPI and PMH.   Physical Exam Vital Signs  I have reviewed the triage vital signs BP (!) 156/79   Pulse 98    Temp 98.2 F (36.8 C) (Rectal)   Resp 19   Wt 70.8 kg   SpO2 97%   BMI 25.97 kg/m  Physical Exam Vitals and nursing note reviewed.  Constitutional:      General: She is in acute distress.     Appearance: Normal appearance. She is well-developed.  She is diaphoretic. She is not ill-appearing.  HENT:     Head: Normocephalic and atraumatic.     Right Ear: External ear normal.     Left Ear: External ear normal.     Nose: Nose normal.     Mouth/Throat:     Mouth: Mucous membranes are moist.  Eyes:     General: No scleral icterus.       Right eye: No discharge.        Left eye: No discharge.     Comments: Pupils reactive b/l, equal  Cardiovascular:     Rate and Rhythm: Tachycardia present.  Pulmonary:     Effort: Pulmonary effort is normal. No respiratory distress.     Breath sounds: No stridor.  Chest:    Abdominal:     General: Abdomen is flat. There is no distension.     Palpations: Abdomen is soft.     Tenderness: There is no guarding.  Musculoskeletal:        General: No deformity.     Cervical back: No rigidity.  Skin:    General: Skin is warm.     Coloration: Skin is not cyanotic, jaundiced or pale.  Neurological:     Mental Status: She is unresponsive.     GCS: GCS eye subscore is 2. GCS verbal subscore is 1. GCS motor subscore is 4.     Comments: Extremities will cross midline Moving all extremities spontaneously Eyes will cross midline Pt responding to noxious stimulus post ictal   Psychiatric:        Speech: Speech normal.        Behavior: Behavior normal. Behavior is cooperative.     ED Results and Treatments Labs (all labs ordered are listed, but only abnormal results are displayed) Labs Reviewed  CBC WITH DIFFERENTIAL/PLATELET - Abnormal; Notable for the following components:      Result Value   WBC 11.6 (*)    RBC 3.55 (*)    Hemoglobin 11.1 (*)    HCT 35.3 (*)    RDW 16.5 (*)    nRBC 0.3 (*)    Neutro Abs 8.6 (*)    All other components  within normal limits  COMPREHENSIVE METABOLIC PANEL WITH GFR - Abnormal; Notable for the following components:   Potassium 3.2 (*)    CO2 20 (*)    Glucose, Bld 148 (*)    Total Protein 6.3 (*)    Alkaline Phosphatase 258 (*)    Anion gap 18 (*)    All other components within normal limits  CBG MONITORING, ED - Abnormal; Notable for the following components:   Glucose-Capillary 162 (*)    All other components within normal limits  I-STAT CHEM 8, ED - Abnormal; Notable for the following components:   Potassium 3.2 (*)    Glucose, Bld 149 (*)    TCO2 18 (*)    Hemoglobin 11.6 (*)    HCT 34.0 (*)    All other components within normal limits  MAGNESIUM   ETHANOL  URINE DRUG SCREEN  Radiology CT Head Wo Contrast Result Date: 12/28/2023 EXAM: CT HEAD WITHOUT CONTRAST 12/28/2023 08:43:23 PM TECHNIQUE: CT of the head was performed without the administration of intravenous contrast. Automated exposure control, iterative reconstruction, and/or weight based adjustment of the mA/kV was utilized to reduce the radiation dose to as low as reasonably achievable. COMPARISON: 05/03/2011 CLINICAL HISTORY: Seizure, new-onset, no history of trauma. FINDINGS: BRAIN AND VENTRICLES: No acute hemorrhage. No evidence of acute infarct. No extra-axial collection. No mass effect or midline shift. ORBITS: No acute abnormality. SINUSES: No acute abnormality. SOFT TISSUES AND SKULL: No acute soft tissue abnormality. No skull fracture. IMPRESSION: 1. No acute intracranial abnormality. Electronically signed by: Oneil Devonshire MD 12/28/2023 08:51 PM EST RP Workstation: [id number]    Pertinent labs & imaging results that were available during my care of the patient were reviewed by me and considered in my medical decision making (see MDM for details).  Medications Ordered in ED Medications  LORazepam   (ATIVAN ) 2 MG/ML injection (  Canceled Entry 12/28/23 2055)  potassium chloride  10 mEq in 100 mL IVPB (10 mEq Intravenous New Bag/Given 12/28/23 2224)  0.9 %  sodium chloride  infusion ( Intravenous New Bag/Given 12/28/23 2221)  levETIRAcetam  (KEPPRA ) undiluted injection 2,500 mg (has no administration in time range)  LORazepam  (ATIVAN ) injection 2 mg (2 mg Intramuscular Given 12/28/23 2008)  LORazepam  (ATIVAN ) 2 MG/ML injection (1 mg  Given 12/28/23 2028)  sodium chloride  0.9 % bolus 1,000 mL (0 mLs Intravenous Stopped 12/28/23 2224)                                                                                                                                     Procedures .Critical Care  Performed by: Elnor Jayson LABOR, DO Authorized by: Elnor Jayson LABOR, DO   Critical care provider statement:    Critical care time (minutes):  30   Critical care time was exclusive of:  Separately billable procedures and treating other patients   Critical care was necessary to treat or prevent imminent or life-threatening deterioration of the following conditions:  CNS failure or compromise   Critical care was time spent personally by me on the following activities:  Development of treatment plan with patient or surrogate, discussions with consultants, evaluation of patient's response to treatment, examination of patient, ordering and review of laboratory studies, ordering and review of radiographic studies, ordering and performing treatments and interventions, pulse oximetry, re-evaluation of patient's condition, review of old charts and obtaining history from patient or surrogate   Care discussed with: admitting provider     (including critical care time)  Medical Decision Making / ED Course    Medical Decision Making:    NOA GALVAO is a 58 y.o. female with past medical history as below, significant for DM, GERD, IBS, metastatic breast cancer who presents to the ED with complaint of AMS, seizure. The  complaint involves an extensive differential diagnosis and also  carries with it a high risk of complications and morbidity.  Serious etiology was considered. Ddx includes but is not limited to: Differential diagnoses for altered mental status includes but is not exclusive to alcohol, illicit or prescription medications, intracranial pathology such as stroke, intracerebral hemorrhage, fever or infectious causes including sepsis, hypoxemia, uremia, trauma, endocrine related disorders such as diabetes, hypoglycemia, thyroid-related diseases, etc.   Complete initial physical exam performed, notably the patient was in acute distress, actively having GTC sz.    Reviewed and confirmed nursing documentation for past medical history, family history, social history.  Vital signs reviewed.    New onset seizure > - pt with metastatic breast cancer, f/w Dr Davonna, current chemo. - report of malaise last few hours, some diarrhea, generalized weakness, confusion - witnessed seizure in the ED, GTC, cyanotic transiently. Sz lasted around 1 minute, post ictal following  - Prolonged postictal period, patient is AO x 2, disoriented to time.  Otherwise neuro intact - Labs, imaging stable - Neurology, recommending Keppra  load, MRI with and without, EEG.  Transferred to [facility] - Spoke with Dr. Shona [facility] agrees with plan for admission  Clinical Course as of 12/28/23 2249  Sat Dec 28, 2023  2129 CTH stable [SG]  2132 Hemoglobin(!): 11.1 Similar to prior [SG]  2132 Mental status seems to be improving, was able to speak w/ daughter [SG]  2204 Alkaline Phosphatase(!): 258 Similar to prior  [SG]    Clinical Course User Index [SG] Elnor Jayson LABOR, DO                    Additional history obtained: -Additional history obtained from family -External records from outside source obtained and reviewed including: Chart review including previous notes, labs, imaging, consultation notes including  Oncology  documentations, treatment plan for cancer, home medications, prior labs   Lab Tests: -I ordered, reviewed, and interpreted labs.   The pertinent results include:   Labs Reviewed  CBC WITH DIFFERENTIAL/PLATELET - Abnormal; Notable for the following components:      Result Value   WBC 11.6 (*)    RBC 3.55 (*)    Hemoglobin 11.1 (*)    HCT 35.3 (*)    RDW 16.5 (*)    nRBC 0.3 (*)    Neutro Abs 8.6 (*)    All other components within normal limits  COMPREHENSIVE METABOLIC PANEL WITH GFR - Abnormal; Notable for the following components:   Potassium 3.2 (*)    CO2 20 (*)    Glucose, Bld 148 (*)    Total Protein 6.3 (*)    Alkaline Phosphatase 258 (*)    Anion gap 18 (*)    All other components within normal limits  CBG MONITORING, ED - Abnormal; Notable for the following components:   Glucose-Capillary 162 (*)    All other components within normal limits  I-STAT CHEM 8, ED - Abnormal; Notable for the following components:   Potassium 3.2 (*)    Glucose, Bld 149 (*)    TCO2 18 (*)    Hemoglobin 11.6 (*)    HCT 34.0 (*)    All other components within normal limits  MAGNESIUM   ETHANOL  URINE DRUG SCREEN    Notable for as above  EKG   EKG Interpretation Date/Time:    Ventricular Rate:    PR Interval:    QRS Duration:    QT Interval:    QTC Calculation:   R Axis:      Text Interpretation:  Imaging Studies ordered: I ordered imaging studies including CT head, MRI brain with and without ordered, this is pending I independently visualized the following imaging with scope of interpretation limited to determining acute life threatening conditions related to emergency care; findings noted above I agree with the radiologist interpretation If any imaging was obtained with contrast I closely monitored patient for any possible adverse reaction a/w contrast administration in the emergency department   Medicines ordered and prescription drug management: Meds ordered  this encounter  Medications   LORazepam  (ATIVAN ) injection 2 mg   LORazepam  (ATIVAN ) 2 MG/ML injection    Belcher, Jenna S: cabinet override   LORazepam  (ATIVAN ) 2 MG/ML injection    Marylu Perkins G: cabinet override   sodium chloride  0.9 % bolus 1,000 mL   potassium chloride  10 mEq in 100 mL IVPB   0.9 %  sodium chloride  infusion   levETIRAcetam  (KEPPRA ) undiluted injection 2,500 mg    -I have reviewed the patients home medicines and have made adjustments as needed   Consultations Obtained: I requested consultation with the neurology, [facility],  and discussed lab and imaging findings as well as pertinent plan - they recommend: MRI with and without, EEG, Keppra  load   Cardiac Monitoring: The patient was maintained on a cardiac monitor.  I personally viewed and interpreted the cardiac monitored which showed an underlying rhythm of: Sinus tach> NSR Continuous pulse oximetry interpreted by myself, 98% on 2LNC.    Social Determinants of Health:  Diagnosis or treatment significantly limited by social determinants of health: tobacco use   Reevaluation: After the interventions noted above, I reevaluated the patient and found that they have improved  Co morbidities that complicate the patient evaluation  Past Medical History:  Diagnosis Date   Depression    Diabetes mellitus without complication (HCC)    Erosive esophagitis 04/26/2010   EGD by Dr. Shaaron, small hiatal hernia   GERD (gastroesophageal reflux disease)    History of stomach ulcers    IBS (irritable bowel syndrome)    Diarrhea predominant   Microscopic colitis 04/26/2010   Colonoscopy by Dr. Shaaron, good response with Entocort   Tubular adenoma of colon 04/26/2010   Junction of descending and sigmoid 40 CM from anus      Dispostion: Disposition decision including need for hospitalization was considered, and patient admitted to the hospital.    Final Clinical Impression(s) / ED Diagnoses Final diagnoses:   Seizure-like activity (HCC)  Metastatic malignant neoplasm, unspecified site (HCC)  Hypokalemia        Elnor Jayson LABOR, DO 12/28/23 2249

## 2023-12-29 ENCOUNTER — Inpatient Hospital Stay (HOSPITAL_COMMUNITY)

## 2023-12-29 ENCOUNTER — Emergency Department (HOSPITAL_COMMUNITY)

## 2023-12-29 DIAGNOSIS — R569 Unspecified convulsions: Secondary | ICD-10-CM | POA: Diagnosis present

## 2023-12-29 DIAGNOSIS — C799 Secondary malignant neoplasm of unspecified site: Secondary | ICD-10-CM | POA: Diagnosis present

## 2023-12-29 DIAGNOSIS — Z7401 Bed confinement status: Secondary | ICD-10-CM | POA: Diagnosis not present

## 2023-12-29 DIAGNOSIS — E876 Hypokalemia: Secondary | ICD-10-CM | POA: Diagnosis present

## 2023-12-29 DIAGNOSIS — R410 Disorientation, unspecified: Secondary | ICD-10-CM | POA: Diagnosis not present

## 2023-12-29 LAB — URINALYSIS, W/ REFLEX TO CULTURE (INFECTION SUSPECTED)
Bacteria, UA: NONE SEEN
Bilirubin Urine: NEGATIVE
Glucose, UA: NEGATIVE mg/dL
Ketones, ur: NEGATIVE mg/dL
Leukocytes,Ua: NEGATIVE
Nitrite: NEGATIVE
Protein, ur: 100 mg/dL — AB
RBC / HPF: >50 RBC/hpf (ref 0–5)
Specific Gravity, Urine: 1.005 (ref 1.005–1.030)
pH: 7 (ref 5.0–8.0)

## 2023-12-29 LAB — CBC WITH DIFFERENTIAL/PLATELET
Band Neutrophils: 6 %
Basophils Absolute: 0 K/uL (ref 0.0–0.1)
Basophils Relative: 0 %
Eosinophils Absolute: 0.2 K/uL (ref 0.0–0.5)
Eosinophils Relative: 1 %
HCT: 35.7 % — ABNORMAL LOW (ref 36.0–46.0)
Hemoglobin: 11.5 g/dL — ABNORMAL LOW (ref 12.0–15.0)
Lymphocytes Relative: 4 %
Lymphs Abs: 0.6 K/uL — ABNORMAL LOW (ref 0.7–4.0)
MCH: 31.1 pg (ref 26.0–34.0)
MCHC: 32.2 g/dL (ref 30.0–36.0)
MCV: 96.5 fL (ref 80.0–100.0)
Monocytes Absolute: 0.8 K/uL (ref 0.1–1.0)
Monocytes Relative: 5 %
Neutro Abs: 13.8 K/uL — ABNORMAL HIGH (ref 1.7–7.7)
Neutrophils Relative %: 84 %
Platelets: 244 K/uL (ref 150–400)
RBC: 3.7 MIL/uL — ABNORMAL LOW (ref 3.87–5.11)
RDW: 16.8 % — ABNORMAL HIGH (ref 11.5–15.5)
WBC: 15.3 K/uL — ABNORMAL HIGH (ref 4.0–10.5)
nRBC: 0.1 % (ref 0.0–0.2)

## 2023-12-29 LAB — URINE DRUG SCREEN
Amphetamines: NEGATIVE
Barbiturates: NEGATIVE
Benzodiazepines: NEGATIVE
Cocaine: NEGATIVE
Fentanyl: NEGATIVE
Methadone Scn, Ur: NEGATIVE
Opiates: NEGATIVE
Tetrahydrocannabinol: NEGATIVE

## 2023-12-29 MED ORDER — MORPHINE SULFATE (PF) 2 MG/ML IV SOLN
2.0000 mg | INTRAVENOUS | Status: AC
  Administered 2023-12-29: 2 mg via INTRAVENOUS
  Filled 2023-12-29: qty 1

## 2023-12-29 MED ORDER — NICOTINE 14 MG/24HR TD PT24
14.0000 mg | MEDICATED_PATCH | Freq: Every day | TRANSDERMAL | Status: DC
  Administered 2023-12-29 – 2024-01-01 (×3): 14 mg via TRANSDERMAL
  Filled 2023-12-29 (×4): qty 1

## 2023-12-29 MED ORDER — VANCOMYCIN HCL 750 MG/150ML IV SOLN
750.0000 mg | Freq: Two times a day (BID) | INTRAVENOUS | Status: DC
Start: 2023-12-29 — End: 2023-12-29
  Filled 2023-12-29: qty 150

## 2023-12-29 MED ORDER — LORAZEPAM 2 MG/ML IJ SOLN
1.0000 mg | INTRAMUSCULAR | Status: AC
  Administered 2023-12-29: 1 mg via INTRAVENOUS

## 2023-12-29 MED ORDER — VALPROATE SODIUM 100 MG/ML IV SOLN
250.0000 mg | Freq: Four times a day (QID) | INTRAVENOUS | Status: AC
  Administered 2023-12-29 – 2023-12-31 (×7): 250 mg via INTRAVENOUS
  Filled 2023-12-29: qty 2.5
  Filled 2023-12-29: qty 250
  Filled 2023-12-29: qty 2.5
  Filled 2023-12-29: qty 250
  Filled 2023-12-29: qty 2.5
  Filled 2023-12-29: qty 250
  Filled 2023-12-29: qty 2.5
  Filled 2023-12-29: qty 250
  Filled 2023-12-29 (×3): qty 2.5
  Filled 2023-12-29: qty 250
  Filled 2023-12-29 (×3): qty 2.5

## 2023-12-29 MED ORDER — PROCHLORPERAZINE EDISYLATE 10 MG/2ML IJ SOLN
5.0000 mg | Freq: Four times a day (QID) | INTRAMUSCULAR | Status: DC | PRN
  Administered 2023-12-29: 5 mg via INTRAVENOUS
  Filled 2023-12-29: qty 2

## 2023-12-29 MED ORDER — POTASSIUM CHLORIDE 10 MEQ/100ML IV SOLN
10.0000 meq | INTRAVENOUS | Status: AC
  Administered 2023-12-29: 10 meq via INTRAVENOUS
  Filled 2023-12-29: qty 100

## 2023-12-29 MED ORDER — SODIUM CHLORIDE 0.9 % IV SOLN
2.0000 g | INTRAVENOUS | Status: DC
  Administered 2023-12-29 – 2024-01-01 (×4): 2 g via INTRAVENOUS
  Filled 2023-12-29 (×4): qty 20

## 2023-12-29 MED ORDER — VANCOMYCIN HCL 1500 MG/300ML IV SOLN
1500.0000 mg | Freq: Once | INTRAVENOUS | Status: AC
  Administered 2023-12-29: 1500 mg via INTRAVENOUS

## 2023-12-29 MED ORDER — LORAZEPAM 2 MG/ML IJ SOLN
1.0000 mg | Freq: Once | INTRAMUSCULAR | Status: AC
  Administered 2023-12-29: 1 mg via INTRAVENOUS
  Filled 2023-12-29: qty 1

## 2023-12-29 MED ORDER — POTASSIUM CHLORIDE 20 MEQ PO PACK
40.0000 meq | PACK | Freq: Once | ORAL | Status: AC
  Administered 2023-12-29: 40 meq via ORAL
  Filled 2023-12-29: qty 2

## 2023-12-29 MED ORDER — DIPHENHYDRAMINE HCL 50 MG/ML IJ SOLN
INTRAMUSCULAR | Status: AC
  Administered 2023-12-29: 12.5 mg via INTRAVENOUS
  Filled 2023-12-29: qty 1

## 2023-12-29 MED ORDER — MORPHINE SULFATE (PF) 2 MG/ML IV SOLN: 2.0000 mg | INTRAVENOUS | Status: DC | PRN

## 2023-12-29 MED ORDER — CHLORHEXIDINE GLUCONATE CLOTH 2 % EX PADS
6.0000 | MEDICATED_PAD | Freq: Every day | CUTANEOUS | Status: DC
  Administered 2023-12-29 – 2024-01-01 (×4): 6 via TOPICAL

## 2023-12-29 MED ORDER — MELATONIN 5 MG PO TABS: 5.0000 mg | ORAL_TABLET | Freq: Every evening | ORAL | Status: DC | PRN

## 2023-12-29 MED ORDER — VANCOMYCIN HCL 750 MG/150ML IV SOLN
750.0000 mg | Freq: Two times a day (BID) | INTRAVENOUS | Status: DC
  Administered 2023-12-29 – 2023-12-31 (×5): 750 mg via INTRAVENOUS
  Filled 2023-12-29 (×6): qty 150

## 2023-12-29 MED ORDER — VALPROATE SODIUM 100 MG/ML IV SOLN
500.0000 mg | INTRAVENOUS | Status: AC
  Administered 2023-12-29: 500 mg via INTRAVENOUS
  Filled 2023-12-29: qty 5

## 2023-12-29 MED ORDER — ACETAMINOPHEN 500 MG PO TABS
500.0000 mg | ORAL_TABLET | Freq: Four times a day (QID) | ORAL | Status: DC | PRN
  Filled 2023-12-29: qty 1

## 2023-12-29 MED ORDER — HALOPERIDOL LACTATE 5 MG/ML IJ SOLN: 1.0000 mg | INTRAMUSCULAR | Status: DC

## 2023-12-29 MED ORDER — SODIUM CHLORIDE 0.9% FLUSH: 10.0000 mL | INTRAVENOUS | Status: DC | PRN

## 2023-12-29 MED ORDER — LORAZEPAM 2 MG/ML IJ SOLN
2.0000 mg | Freq: Four times a day (QID) | INTRAMUSCULAR | Status: DC | PRN
  Filled 2023-12-29 (×2): qty 1

## 2023-12-29 MED ORDER — POLYETHYLENE GLYCOL 3350 17 G PO PACK: 17.0000 g | PACK | Freq: Every day | ORAL | Status: DC | PRN

## 2023-12-29 MED ORDER — HYDROMORPHONE HCL 1 MG/ML IJ SOLN
1.0000 mg | INTRAMUSCULAR | Status: DC | PRN
  Administered 2023-12-29 – 2023-12-30 (×4): 1 mg via INTRAVENOUS
  Filled 2023-12-29 (×4): qty 1

## 2023-12-29 MED ORDER — DIPHENHYDRAMINE HCL 50 MG/ML IJ SOLN: 12.5000 mg | INTRAMUSCULAR | Status: AC

## 2023-12-29 MED ORDER — SODIUM CHLORIDE 0.9% FLUSH
10.0000 mL | Freq: Two times a day (BID) | INTRAVENOUS | Status: DC
  Administered 2023-12-30: 10 mL
  Administered 2023-12-31: 20 mL
  Administered 2023-12-31 – 2024-01-01 (×2): 10 mL

## 2023-12-29 MED ORDER — LORAZEPAM 2 MG/ML IJ SOLN
1.0000 mg | Freq: Once | INTRAMUSCULAR | Status: AC
  Administered 2023-12-29: 1 mg via INTRAVENOUS

## 2023-12-29 NOTE — Progress Notes (Signed)
 LTM EEG hooked up and running - no initial skin breakdown - push button tested - [facility] monitoring. MRI compatible leads.

## 2023-12-29 NOTE — ED Notes (Signed)
 EEG at bedside, unable to obtain d/t agitation. Provider notified.

## 2023-12-29 NOTE — ED Notes (Signed)
 Neuro at [facility].

## 2023-12-29 NOTE — ED Provider Notes (Signed)
 Patient seen on arrival to from [facility] to this facility awaiting inpatient bed, MRI, EEG.  Discussed transport with EMS, patient hemodynamically unremarkable aside from tachycardia in transport.   Garrick Charleston, MD 12/29/23 [phone]

## 2023-12-29 NOTE — Procedures (Signed)
 Patient Name: Monica Soto  MRN: [id number]  Epilepsy Attending: Pastor Falling  Referring Physician/Provider: No ref. provider found      Date: 12/29/2023 Duration: 22 minutes  Patient history: 58 year old woman with metastatic brain cancer who presents with new seizure.  Level of alertness: Awake, drowsy  AEDs during EEG study: Depakote   Technical aspects: This EEG study was done with scalp electrodes positioned according to the 10-20 International system of electrode placement. Electrical activity was reviewed with band pass filter of 1-70Hz , sensitivity of 7 uV/mm, display speed of 43mm/sec with a 60Hz  notched filter applied as appropriate. EEG data were recorded continuously and digitally stored.  Video monitoring was available and reviewed as appropriate.  Description: EEG showed continuous generalized polymorphic sharply contoured 3 to 6 Hz theta-delta slowing. Hyperventilation and photic stimulation were not performed.     ABNORMALITY - Continuous slow, generalized   IMPRESSION: This study is suggestive of mild to moderate diffuse encephalopathy, nonspecific etiology but likely related to sedation, toxic-metabolic etiology. No seizures or epileptiform discharges were seen throughout the recording.   Pastor Falling MD Neurology

## 2023-12-29 NOTE — Progress Notes (Addendum)
 Returned to bedside and the patient appears calmer after her 2nd dose of IV Valproic  acid 500 mg.  Soft waist belt restraint discontinued.    She is sitting on the commode.  Urine in container appears light yellow, however, per bedside RN, the patient had a light pink color exudate noted on wet towel after wiping her perineal area.  Unclear of the source.  Per daughter at bedside, the patient has had a hysterectomy in the past.  Unclear if she has had a total hysterectomy with cervix removed, or if she has a history of hemorrhoids.  Advised bedside RN to closely monitor and give us  an update.  CBC has been ordered and it is pending.  The patient is on chemical DVT prophylaxis, SQ Lovenox  40 mg daily, due to high risk for thrombosis in the setting of malignancy.  Hold SQ Lovenox  if overt bleeding is noted and acute blood loss anemia ensues.  UA ordered and is pending.  Time: 15 minutes.  UPDATE:  Per bedside RN Marylu Perkins, the patient had gross hematuria in the commode.  The urine was dumped prior to the patient being transported to [facility] via [place].  The writer did not get a chance to see it.  Will hold off on SQ Lovenox  until overt bleeding is ruled out.  SCDs added as DVT prophylaxis.

## 2023-12-29 NOTE — Progress Notes (Signed)
 Returned to bedside due to persistent agitation despite IV Valproic  acid 500 mg x 1.  Repeating the dose. Exam is unchanged. We will continue to closely monitor and treat as indicated.  Time:  15 minutes

## 2023-12-29 NOTE — ED Notes (Addendum)
 Pt continually pulling off monitoring with mittens in place. Provider notified of agitation and bloody urine.

## 2023-12-29 NOTE — ED Notes (Signed)
 Pt placed on [facility] for sats in upper 80s-low 90s after dilaudid  admin.

## 2023-12-29 NOTE — ED Notes (Signed)
 Hospilalist in the room to assess the pt due to continued restlessness. Symptoms not relieved by medication, therapeutic communication, or therapeutic touch. Pulse ox placed on pt to assess for respiratory depression. Readings were high 80s to 90s. Two litres [facility] placed on pt per drs orders

## 2023-12-29 NOTE — Progress Notes (Signed)
 EEG complete - results pending

## 2023-12-29 NOTE — ED Notes (Signed)
 Unable to get labs due to pt position. Port in positional position. Reconnected meds

## 2023-12-29 NOTE — ED Notes (Signed)
 Pt trying to get out of bed saying she needs to pee. Pt is agitated and confused. Staff and family attempting to calm pt and reorient to situation. Pt pulling at wires. Due to pt movement wires removed for safety.

## 2023-12-29 NOTE — Progress Notes (Signed)
 Neurology progress note  57 yo woman with hx breast cancer metastatic to bone and liver transferred from [facility] after first-time seizure. Since arrival to [facility] she has remained extremely altered and v agitated, alternately sleeping then trying to get out of bed. I think her agitation is multifactorial 2/2 post-ictal state, adverse reaction to keppra , and pain. We are not able to get MRI brain or EEG until she is less restless/agitated. I am concerned about her peeing blood and gesturing to new abdominal pain. Recommend CT a/p r/o stone or other etiology. She also has severe urinary urgency and could have severe UTI. I put in prn dilaudid  for pain. Regarding her seizures I switched her from maintenance keppra  to depakote  which should help with agitation. I am hopeful that as the keppra  gets out of her system her mental status will improve enough for MRI and EEG. Full consult note to follow. Neurology will continue to follow.  Elida Ross, MD [facility] [phone]  If 7pm- 7am, please page neurology on call as listed in AMION.

## 2023-12-29 NOTE — ED Notes (Signed)
 Pt urinated and color was red tinged. New finding alerted to [facility] who was in the room with this nurse and pt.

## 2023-12-29 NOTE — ED Notes (Signed)
 Pt arrives via [facility] from [facility]. Pt altered and restless. EDP aware of pts arrival.

## 2023-12-29 NOTE — Progress Notes (Addendum)
 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  Family reports she has diarrhea for last 4 days associated with nausea without vomiting.  She also has confusion and fatigue.  Family decided to bring her in the ED.  While they were getting her into the car they noticed that her eyes were fluttering and she had blank stare, lasting few seconds.  She has another episode of eyes twitching, her arms and legs began to shake lasting for 30 seconds witnessed by nursing staff.  Patient has received 6 mg of IV Ativan  in the ED.  EDP discussed the case with neurology who recommended Keppra  load 2.5 mg x 1.  Neurologist recommended admission in the [facility] for seizure workup and management.  Assessment & Plan:   Principal Problem:   New onset seizure (HCC)  New onset seizure, unknown trigger : Patient presented with 2 episodes of blank stares with confusion. CT head > No acute intracranial abnormality found. Follow MRI brain with and without contrast. Follow EEG. Urinalysis drug screen negative. UA shows hemoglobin positive. Continue Seizure precautions Continue as needed IV Ativan  for breakthrough seizures. Neurology consulted by EDP.  Neurology notified. EDP discussed the case with neurology recommended Keppra  load, hold on maintenance Keppra .   Acute metabolic encephalopathy in the setting of new onset seizure: Treat underlying condition.  UA negative for infection. Urine drug screen negative. She has received valproic  acid 500 mg x 1 for agitation in the setting of new onset seizure. Fall  precautions Aspiration precautions. Continue soft restraints for patient's own safety. Family is at bedside for reorientation.   Right chest wound and concern for infection, POA: Wound care consulted. Local wound care with the guidance of wound care specialist. Cover with IV vancomycin  and Rocephin  in immunocompromised state. Recent chemotherapy 3 days ago. Monitor fever curve and WBCs.   Right breast cancer with metastasis to bones and liver : Continue Trodelvy . Outpatient follow-up with medical oncology.   Diarrhea, suspect chemotherapy-induced: Reportedly the patient was having diarrhea days prior to admission. Continue IV fluid hydration. Monitor electrolytes and replete as indicated.   Hypokalemia: Replaced.  Continue to monitor   Elevated alkaline phosphatase in the setting of bone metastasis: Alkaline phosphatase 258 Monitor for now   Tobacco use: Nicotine  patch  Hematuria: UA shows positive hemoglobin. Urology consulted.  Avoid anticoagulation or antiplatelets.   DVT prophylaxis: SCDs Code Status: Full code Family Communication: Daughter and husband at bedside Disposition Plan:   Admitted for new onset seizure episode  in the setting of diarrhea.    Consultants:  Neurology Urology  Procedures: MRI ,  EEG Antimicrobials:  Anti-infectives (From admission, onward)    Start     Dose/Rate Route Frequency Ordered Stop   12/29/23 1200  vancomycin  (VANCOREADY) IVPB 750 mg/150 mL        750 mg 150 mL/hr over 60 Minutes Intravenous Every 12 hours 12/29/23 0022     12/29/23 0100  cefTRIAXone  (ROCEPHIN ) 2 g in sodium chloride  0.9 % 100 mL IVPB        2 g 200 mL/hr over 30  Minutes Intravenous Every 24 hours 12/29/23 0015     12/29/23 0030  vancomycin  (VANCOREADY) IVPB 1500 mg/300 mL        1,500 mg 150 mL/hr over 120 Minutes Intravenous  Once 12/29/23 0018 12/29/23 1054      Subjective: Patient was seen and examined at bedside. Overnight events  noted. Patient has been agitated and restless,  trying to get out of the bed. Family is at bedside trying to reorient.  Objective: Vitals:   12/29/23 0712 12/29/23 0745 12/29/23 0800 12/29/23 0900  BP: 112/72 117/66 (!) 140/86 135/83  Pulse: (!) 108 (!) 101 97 (!) 101  Resp: 20     Temp: 98.1 F (36.7 C)     TempSrc: Axillary     SpO2: 93% 93% 98% 96%  Weight:        Intake/Output Summary (Last 24 hours) at 12/29/2023 1106 Last data filed at 12/29/2023 9378 Gross per 24 hour  Intake 1705.32 ml  Output --  Net 1705.32 ml   Filed Weights   12/28/23 2005  Weight: 70.8 kg    Examination:  General exam: Appears restless, agitated, not in any acute distress. Respiratory system: CTA bilaterally . Respiratory effort normal. RR 16 Cardiovascular system: S1 & S2 heard, RRR. No JVD, murmurs, rubs, gallops or clicks.  Gastrointestinal system: Abdomen is non distended, soft and non tender.  Normal bowel sounds heard. Central nervous system: Alert and oriented x 2. No focal neurological deficits. Extremities: No edema, no cyanosis, no clubbing. Skin: No rashes, lesions or ulcers Psychiatry: Mood & affect appropriate.    Data Reviewed: I have personally reviewed following labs and imaging studies  CBC: Recent Labs  Lab 12/24/23 0811 12/28/23 2023 12/28/23 2057 12/29/23 0703  WBC 4.5 11.6*  --  15.3*  NEUTROABS 2.9 8.6*  --  13.8*  HGB 10.8* 11.1* 11.6* 11.5*  HCT 34.7* 35.3* 34.0* 35.7*  MCV 98.6 99.4  --  96.5  PLT 225 231  --  244   Basic Metabolic Panel: Recent Labs  Lab 12/24/23 0811 12/28/23 2023 12/28/23 2057  NA 139 140 141  K 3.6 3.2* 3.2*  CL 104 103 104  CO2 22 20*  --   GLUCOSE 135* 148* 149*  BUN 9 8 7   CREATININE 0.56 0.56 0.60  CALCIUM 8.7* 9.2  --   MG 1.6* 1.9  --   PHOS 3.8  --   --    GFR: Estimated Creatinine Clearance: 76.6 mL/min (by C-G formula based on SCr of 0.6 mg/dL). Liver Function Tests: Recent Labs  Lab 12/24/23 0811  12/28/23 2023  AST 28 32  ALT 16 21  ALKPHOS 210* 258*  BILITOT <0.2 <0.2  PROT 6.3* 6.3*  ALBUMIN 3.8 3.9   No results for input(s): LIPASE, AMYLASE in the last 168 hours. No results for input(s): AMMONIA in the last 168 hours. Coagulation Profile: No results for input(s): INR, PROTIME in the last 168 hours. Cardiac Enzymes: No results for input(s): CKTOTAL, CKMB, CKMBINDEX, TROPONINI in the last 168 hours. BNP (last 3 results) No results for input(s): PROBNP in the last 8760 hours. HbA1C: No results for input(s): HGBA1C in the last 72 hours. CBG: Recent Labs  Lab 12/28/23 2031  GLUCAP 162*   Lipid Profile: No results for input(s): CHOL, HDL, LDLCALC, TRIG, CHOLHDL, LDLDIRECT in the last 72 hours. Thyroid Function Tests: No results for input(s): TSH, T4TOTAL, FREET4, T3FREE, THYROIDAB in the last 72 hours. Anemia Panel: No results for input(s): VITAMINB12, FOLATE,  FERRITIN, TIBC, IRON , RETICCTPCT in the last 72 hours. Sepsis Labs: No results for input(s): PROCALCITON, LATICACIDVEN in the last 168 hours.  No results found for this or any previous visit (from the past 240 hours).   Radiology Studies: CT Head Wo Contrast Result Date: 12/28/2023 EXAM: CT HEAD WITHOUT CONTRAST 12/28/2023 08:43:23 PM TECHNIQUE: CT of the head was performed without the administration of intravenous contrast. Automated exposure control, iterative reconstruction, and/or weight based adjustment of the mA/kV was utilized to reduce the radiation dose to as low as reasonably achievable. COMPARISON: 05/03/2011 CLINICAL HISTORY: Seizure, new-onset, no history of trauma. FINDINGS: BRAIN AND VENTRICLES: No acute hemorrhage. No evidence of acute infarct. No extra-axial collection. No mass effect or midline shift. ORBITS: No acute abnormality. SINUSES: No acute abnormality. SOFT TISSUES AND SKULL: No acute soft tissue abnormality. No skull fracture.  IMPRESSION: 1. No acute intracranial abnormality. Electronically signed by: Oneil Devonshire MD 12/28/2023 08:51 PM EST RP Workstation: [id number]   Scheduled Meds:  nicotine   14 mg Transdermal Daily   Continuous Infusions:  0.9 % NaCl with KCl 20 mEq / L     cefTRIAXone  (ROCEPHIN )  IV Stopped (12/29/23 0811)   vancomycin        LOS: 1 day    Time spent: 50 mins    Darcel Dawley, MD [facility]   If 7PM-7AM, please contact night-coverage

## 2023-12-29 NOTE — ED Notes (Signed)
 Pt appears to be more restless as noted by continuous and constant leg and arm movement/inability to get comfortable after IV ativan  administration--MD, Shona, made aware

## 2023-12-29 NOTE — ED Notes (Signed)
 Safety sitter now at [facility]. Pt calm and resting.

## 2023-12-29 NOTE — Progress Notes (Signed)
 EEG attempted. Patient is agitated and family as well. Will attempt later.

## 2023-12-29 NOTE — Progress Notes (Signed)
 Pharmacy Antibiotic Note  VARIE MACHAMER is a 58 y.o. female admitted on 12/28/2023 with seizure activity and wound infection.  Pharmacy has been consulted for vancomycin  dosing.  Plan: Vancomycin  1500mg  x1 then 750mg  IV Q 12 hrs. Goal AUC 400-550.  Expected AUC 470.  SCr used 0.8.   Weight: 70.8 kg (156 lb 1.4 oz)  Temp (24hrs), Avg:98.2 F (36.8 C), Min:98.2 F (36.8 C), Max:98.2 F (36.8 C)  Recent Labs  Lab 12/24/23 0811 12/28/23 2023 12/28/23 2057  WBC 4.5 11.6*  --   CREATININE 0.56 0.56 0.60    Estimated Creatinine Clearance: 76.6 mL/min (by C-G formula based on SCr of 0.6 mg/dL).    No Known Allergies  Thank you for allowing pharmacy to be a part of this patient's care.  Marvetta Dauphin, PharmD, BCPS  12/29/2023 12:23 AM

## 2023-12-30 ENCOUNTER — Inpatient Hospital Stay (HOSPITAL_COMMUNITY)

## 2023-12-30 DIAGNOSIS — R569 Unspecified convulsions: Secondary | ICD-10-CM | POA: Diagnosis not present

## 2023-12-30 DIAGNOSIS — C787 Secondary malignant neoplasm of liver and intrahepatic bile duct: Secondary | ICD-10-CM | POA: Diagnosis not present

## 2023-12-30 DIAGNOSIS — C7951 Secondary malignant neoplasm of bone: Secondary | ICD-10-CM

## 2023-12-30 DIAGNOSIS — C50919 Malignant neoplasm of unspecified site of unspecified female breast: Secondary | ICD-10-CM

## 2023-12-30 DIAGNOSIS — T426X5A Adverse effect of other antiepileptic and sedative-hypnotic drugs, initial encounter: Secondary | ICD-10-CM

## 2023-12-30 LAB — COMPREHENSIVE METABOLIC PANEL WITH GFR
ALT: 17 U/L (ref 0–44)
AST: 19 U/L (ref 15–41)
Albumin: 2.7 g/dL — ABNORMAL LOW (ref 3.5–5.0)
Alkaline Phosphatase: 165 U/L — ABNORMAL HIGH (ref 38–126)
Anion gap: 13 (ref 5–15)
BUN: 10 mg/dL (ref 6–20)
CO2: 24 mmol/L (ref 22–32)
Calcium: 8 mg/dL — ABNORMAL LOW (ref 8.9–10.3)
Chloride: 109 mmol/L (ref 98–111)
Creatinine, Ser: 0.91 mg/dL (ref 0.44–1.00)
GFR, Estimated: >60 mL/min (ref >60)
Glucose, Bld: 86 mg/dL (ref 70–99)
Potassium: 3.5 mmol/L (ref 3.5–5.1)
Sodium: 146 mmol/L — ABNORMAL HIGH (ref 135–145)
Total Bilirubin: 0.7 mg/dL (ref 0.0–1.2)
Total Protein: 5.7 g/dL — ABNORMAL LOW (ref 6.5–8.1)

## 2023-12-30 LAB — CBC WITH DIFFERENTIAL/PLATELET
Basophils Absolute: 0 K/uL (ref 0.0–0.1)
Basophils Relative: 0 %
Eosinophils Absolute: 0.1 K/uL (ref 0.0–0.5)
Eosinophils Relative: 1 %
HCT: 35.2 % — ABNORMAL LOW (ref 36.0–46.0)
Hemoglobin: 10.9 g/dL — ABNORMAL LOW (ref 12.0–15.0)
Lymphocytes Relative: 12 %
Lymphs Abs: 1.2 K/uL (ref 0.7–4.0)
MCH: 30.5 pg (ref 26.0–34.0)
MCHC: 31 g/dL (ref 30.0–36.0)
MCV: 98.6 fL (ref 80.0–100.0)
Monocytes Absolute: 0.6 K/uL (ref 0.1–1.0)
Monocytes Relative: 6 %
Neutro Abs: 8 K/uL — ABNORMAL HIGH (ref 1.7–7.7)
Neutrophils Relative %: 81 %
Platelets: 244 K/uL (ref 150–400)
RBC: 3.57 MIL/uL — ABNORMAL LOW (ref 3.87–5.11)
RDW: 17.5 % — ABNORMAL HIGH (ref 11.5–15.5)
WBC: 9.9 K/uL (ref 4.0–10.5)
nRBC: 0.2 % (ref 0.0–0.2)

## 2023-12-30 LAB — C-REACTIVE PROTEIN: CRP: 14.7 mg/dL — ABNORMAL HIGH (ref <1.0)

## 2023-12-30 LAB — CBG MONITORING, ED: Glucose-Capillary: 151 mg/dL — ABNORMAL HIGH (ref 70–99)

## 2023-12-30 LAB — PROCALCITONIN: Procalcitonin: 0.52 ng/mL

## 2023-12-30 LAB — MAGNESIUM: Magnesium: 2 mg/dL (ref 1.7–2.4)

## 2023-12-30 LAB — PHOSPHORUS: Phosphorus: 5.1 mg/dL — ABNORMAL HIGH (ref 2.5–4.6)

## 2023-12-30 MED ORDER — SODIUM CHLORIDE 0.45 % IV SOLN: INTRAVENOUS | Status: AC

## 2023-12-30 MED ORDER — LORAZEPAM 2 MG/ML IJ SOLN
2.0000 mg | Freq: Once | INTRAMUSCULAR | Status: AC | PRN
  Administered 2023-12-30: 2 mg via INTRAVENOUS
  Filled 2023-12-30: qty 1

## 2023-12-30 MED ORDER — GADOBUTROL 1 MMOL/ML IV SOLN
8.0000 mL | Freq: Once | INTRAVENOUS | Status: AC | PRN
  Administered 2023-12-30: 8 mL via INTRAVENOUS

## 2023-12-30 MED ORDER — PANTOPRAZOLE SODIUM 40 MG PO TBEC: 40.0000 mg | DELAYED_RELEASE_TABLET | Freq: Every day | ORAL | Status: DC | PRN

## 2023-12-30 MED ORDER — LIDOCAINE-PRILOCAINE 2.5-2.5 % EX CREA: TOPICAL_CREAM | Freq: Every day | CUTANEOUS | Status: DC | PRN

## 2023-12-30 MED ORDER — PROCHLORPERAZINE MALEATE 10 MG PO TABS: 10.0000 mg | ORAL_TABLET | Freq: Four times a day (QID) | ORAL | Status: DC | PRN

## 2023-12-30 NOTE — Consult Note (Signed)
 WOC team consulted for R breast wound.  Secure chat to primary team requesting photo documentation of this wound be uploaded for consult.    Thank you,    Powell Bar MSN, RN-BC, [employer]

## 2023-12-30 NOTE — Consult Note (Addendum)
 NEUROLOGY CONSULT NOTE   Date of service: 12/29/23 Patient Name: Monica Soto MRN:  [id number] DOB:  Dec 18, 1965 Chief Complaint: seizure Requesting Provider: Dr. Leotis  History of Present Illness  VICCI REDER is a 58 y.o. female with hx of breast cancer metastatic to bone and liver who presents with first-time seizure. She told daughter over the phone yesterday that she wasn't feeling well but was not able to tell her why. She had been having GI disturbance with diarrhea for 3 days. She became confused and husband decided to bring her to [facility] ED for further evaluation. When he was getting her in the car her eyes fluttered and she had a blank stare lasting seconds. When they arrived at the ER she screamed and held her abdomen then patient had a witnessed seizure with right gaze deviation f/b generalized tonic clonic seizure lasting 30 seconds. She received a total of 6mg  IV ativan  in the ED and keppra  load. After she received keppra  she became extremely agitated and has been restless and agitated since. New hematuria overnight. Patient remains alternately sedated and then agitated and altered this AM.   ROS   Unable to ascertain due to AMS  Past History   Past Medical History:  Diagnosis Date   Depression    Diabetes mellitus without complication (HCC)    Erosive esophagitis 04/26/2010   EGD by Dr. Shaaron, small hiatal hernia   GERD (gastroesophageal reflux disease)    History of stomach ulcers    IBS (irritable bowel syndrome)    Diarrhea predominant   Microscopic colitis 04/26/2010   Colonoscopy by Dr. Shaaron, good response with Entocort   Tubular adenoma of colon 04/26/2010   Junction of descending and sigmoid 40 CM from anus    Past Surgical History:  Procedure Laterality Date   ABDOMINAL HYSTERECTOMY     APPENDECTOMY  2/11   Dr. Floria with a delayed closure   BREAST BIOPSY Right 06/22/2022   US  RT BREAST BX W LOC DEV 1ST LESION IMG BX SPEC US  GUIDE 06/22/2022 [facility]  MAMMOGRAPHY   PORTACATH PLACEMENT N/A 07/16/2022   Procedure: INSERTION PORT-A-CATH;  Surgeon: [name], MD;  Location: [facility];  Service: General;  Laterality: N/A;   TONSILLECTOMY     TUBAL LIGATION      Family History: Family History  Problem Relation Age of Onset   Diabetes Mother    Coronary artery disease Mother    Healthy Father     Social History  reports that she has been smoking cigarettes. She has never used smokeless tobacco. She reports current alcohol use of about 1.0 standard drink of alcohol per week. She reports that she does not use drugs.  No Known Allergies  Medications   Current Facility-Administered Medications:    acetaminophen  (TYLENOL ) tablet 500 mg, 500 mg, Oral, Q6H PRN, Shona, Carole N, DO   cefTRIAXone  (ROCEPHIN ) 2 g in sodium chloride  0.9 % 100 mL IVPB, 2 g, Intravenous, Q24H, Hall, Carole N, DO, Last Rate: 200 mL/hr at 12/30/23 0513, 2 g at 12/30/23 9486   Chlorhexidine  Gluconate Cloth 2 % PADS 6 each, 6 each, Topical, Daily, Leotis Bogus, MD, 6 each at 12/29/23 1842   HYDROmorphone  (DILAUDID ) injection 1 mg, 1 mg, Intravenous, Q4H PRN, Leotis Bogus, MD, 1 mg at 12/30/23 0252   melatonin tablet 5 mg, 5 mg, Oral, QHS PRN, Shona Laurence N, DO   nicotine  (NICODERM CQ  - dosed in mg/24 hours) patch 14 mg, 14 mg, Transdermal,  Daily, Shona Laurence N, DO, 14 mg at 12/29/23 0300   polyethylene glycol (MIRALAX  / GLYCOLAX ) packet 17 g, 17 g, Oral, Daily PRN, Hall, Carole N, DO   prochlorperazine  (COMPAZINE ) injection 5 mg, 5 mg, Intravenous, Q6H PRN, Shona Laurence N, DO, 5 mg at 12/29/23 0058   sodium chloride  flush (NS) 0.9 % injection 10-40 mL, 10-40 mL, Intracatheter, Q12H, Khatri, Pardeep, MD   sodium chloride  flush (NS) 0.9 % injection 10-40 mL, 10-40 mL, Intracatheter, PRN, Leotis Bogus, MD   valproate (DEPACON ) 250 mg in dextrose  5 % 50 mL IVPB, 250 mg, Intravenous, Q6H, Matthews Elida HERO, MD, Last Rate: 52.5 mL/hr at 12/29/23  2309, 250 mg at 12/29/23 2309   vancomycin  (VANCOREADY) IVPB 750 mg/150 mL, 750 mg, Intravenous, Q12H, Leotis Bogus, MD, Stopped at 12/29/23 2121  Facility-Administered Medications Ordered in Other Encounters:    0.9 %  sodium chloride  infusion, , Intravenous, Continuous, [name], MD, Stopped at 09/24/23 1259  Vitals   Vitals:   12/29/23 1438 12/29/23 1934 12/30/23 0009 12/30/23 0422  BP: 135/78 (!) 144/84 111/74 98/60  Pulse: 92     Resp: 17     Temp: 98.1 F (36.7 C) 97.7 F (36.5 C) (!) 97.3 F (36.3 C) 97.6 F (36.4 C)  TempSrc: Axillary Oral Axillary Axillary  SpO2:  (!) 88%    Weight: 70.8 kg     Height: 5' 5 (1.651 m)       Body mass index is 25.97 kg/m.   Physical Exam   Gen: snoring initially, then woke up with urinary urgency, anxious, altered CV: RRR Resp: CTAB  MS: confused, agitated, will not answer orientation questions Speech: dysarthric Motor: moves all extremities spontaneously without apparent weakness  Labs/Imaging/Neurodiagnostic studies   CBC:  Recent Labs  Lab 2024-01-07 2023 01/07/24 2057 12/29/23 0703  WBC 11.6*  --  15.3*  NEUTROABS 8.6*  --  13.8*  HGB 11.1* 11.6* 11.5*  HCT 35.3* 34.0* 35.7*  MCV 99.4  --  96.5  PLT 231  --  244   Basic Metabolic Panel:  Lab Results  Component Value Date   NA 141 07-Jan-2024   K 3.2 (L) 01/07/2024   CO2 20 (L) 07-Jan-2024   GLUCOSE 149 (H) 01-07-24   BUN 7 07-Jan-2024   CREATININE 0.60 01/07/24   CALCIUM 9.2 01/07/24   GFRNONAA >60 01/07/2024   GFRAA >60 03/29/2015   Lipid Panel: No results found for: LDLCALC HgbA1c: No results found for: HGBA1C Urine Drug Screen:     Component Value Date/Time   LABOPIA NEGATIVE Jan 07, 2024 2259   COCAINSCRNUR NEGATIVE 01-07-2024 2259   LABBENZ NEGATIVE 07-Jan-2024 2259   AMPHETMU NEGATIVE 2024/01/07 2259   THCU NEGATIVE Jan 07, 2024 2259   LABBARB NEGATIVE 2024/01/07 2259    Alcohol Level     Component Value Date/Time   ETH <15  January 07, 2024 2023   INR  Lab Results  Component Value Date   INR 1.0 12/07/2022   APTT No results found for: APTT AED levels: No results found for: PHENYTOIN, ZONISAMIDE, LAMOTRIGINE, LEVETIRACETA  CT Head without contrast(Personally reviewed): Head CT unremarkable  ASSESSMENT   58 yo woman with hx breast cancer metastatic to bone and liver transferred from [facility] after first-time seizure. Since arrival to [facility] she has remained extremely altered and v agitated, alternately sleeping then trying to get out of bed. I think her agitation is multifactorial 2/2 post-ictal state, adverse reaction to keppra , and pain. We are not able to get MRI brain  or EEG until she is less restless/agitated. I am concerned about her peeing blood and gesturing to new abdominal pain. Recommend CT a/p r/o stone or other etiology. She also has severe urinary urgency and could have severe UTI. I put in prn dilaudid  for pain. Regarding her seizures I switched her from maintenance keppra  to depakote  which should help with agitation. I am hopeful that as the keppra  gets out of her system her mental status will improve enough for MRI and EEG. ______________________________________________________________________    Signed, Elida CHRISTELLA Ross, MD [facility]

## 2023-12-30 NOTE — Consult Note (Signed)
 WOC Nurse Consult Note: Reason for Consult: Right breast scarring with wound  Wound type: history of breast cancer; chronic non healing wounds in this area  Pressure Injury POA: NA Measurement: see nursing flow sheets Wound bed: pink, some crusted areas Drainage (amount, consistency, odor) see nursing flow sheets Periwound: scar tissue Dressing procedure/placement/frequency: Cleanse breast wounds with saline, pat dry. Cover open areas with single layer of xeroform and top with foam. Change every other day    Re consult if needed, will not follow at this time. Thanks  Kateri Balch [employer], RN,CWOCN, CNS, [employer] [phone]

## 2023-12-30 NOTE — TOC Transition Note (Signed)
 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 Patient manages own medication at home. Patient does not have a PCP.   Therapy recommendation for HH. HH services arranged via [facility] for therapy. Info on AVS. DME recommendation for walker and BSC. Patient refuses this equipement-reporting having a walker at home and feels her home is to small to accommodate [facility].   CM contacted PCP office at [facility] family medicine, they will contact patient to confirm appt time after this has been scheduled.   CM will continue to follow.       Barriers to Discharge: Continued Medical Work up   Patient Goals and CMS Choice   CMS Medicare.gov Compare Post Acute Care list provided to:: Patient Choice offered to / list presented to : Patient      Discharge Placement                       Discharge Plan and Services Additional resources added to the After Visit Summary for                            [facility] Arranged: PT [facility] Agency: [facility] Date [facility] Agency Contacted: 12/30/23 Time HH Agency Contacted: [id number] Representative spoke with at [facility] Agency: Joane  Social Drivers of Health (SDOH) Interventions SDOH Screenings   Food Insecurity: No Food Insecurity (12/29/2023)  Housing: Unknown (12/29/2023)  Transportation Needs: No  Transportation Needs (12/29/2023)  Utilities: Not At Risk (12/29/2023)  Depression (PHQ2-9): Low Risk  (12/24/2023)  Tobacco Use: High Risk (12/28/2023)     Readmission Risk Interventions     No data to display

## 2023-12-30 NOTE — Plan of Care (Signed)

## 2023-12-30 NOTE — Progress Notes (Signed)
 PROGRESS NOTE     Patient Demographics:    Monica Soto, is a 58 y.o. 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  Family reports she has diarrhea for last 4 days associated with nausea without vomiting.    She also had confusion and fatigue.  Family decided to bring her in the ED.  While they were getting her into the car they noticed that her eyes were fluttering and she had blank stare, lasting few seconds.  She has another episode of eyes twitching, her arms and legs began to shake lasting for 30 seconds witnessed by nursing staff.  Patient has received 6 mg of IV Ativan  in the ED.  EDP discussed the case with neurology who recommended Keppra  load 2.5 mg x 1.  Neurologist recommended admission in the [facility] for seizure workup and management.    Subjective:    Monica Soto today has, No headache, No chest pain, No abdominal pain - No Nausea, No new weakness tingling or numbness, no shortness of breath   Assessment  & Plan :    Syncope, with dehydration, hypotension and possible new onset seizure, unknown trigger : Patient presented with 2 episodes of blank stares with confusion.  Could  have been due to hypoperfusion from dehydration and hypotension caused by diarrhea after chemo, also has history of metastatic breast cancer hence brain mets need to be excluded.  Seen by neurology, CT head unremarkable, MRI today, EEG stable, continue to monitor on AEDs, speech eval,  PT eval.  Defer management of this issue to neurology.   Acute metabolic encephalopathy in the setting of new onset seizure versus syncope: Treat underlying condition.  UA negative for infection. Urine drug screen negative. Continue AEDs, hydrate with IV fluids and monitor GI symptoms have resolved   Right chest wound and concern for infection, POA: Wound care consulted. Local wound care with the guidance of wound care specialist. Cover with IV vancomycin  and Rocephin  in immunocompromised state. Recent chemotherapy 3 days ago. Monitor fever curve and WBCs.   Right breast cancer with metastasis to bones and liver : Continue Trodelvy . Outpatient follow-up with medical oncology.   Diarrhea, dehydration, hyponatremia, hypotension likely due to suspect chemotherapy-induced diarrhea: Reportedly the patient was having diarrhea days prior to admission. Continue IV fluid hydration. Monitor electrolytes and replete as indicated.  Supportive care for diarrhea.   Hypokalemia: Replaced.  Continue to monitor   Elevated alkaline phosphatase in the setting of bone metastasis: Alkaline phosphatase 258 Monitor for now   Tobacco use: Nicotine  patch   Hematuria mild hydronephrosis: UA shows positive hemoglobin. Avoid anticoagulation or antiplatelets.  Creatinine is stable.  Will continue to monitor.      Condition - Extremely Guarded  Family Communication  : Daughter and husband bedside on 12/30/2023  Code Status : Full code  Consults  : Neurology  PUD Prophylaxis :    Procedures  :     MRI brain.    EEG.  No acute seizures.    CT brain.  Nonacute.    CT abdomen pelvis.   1. Liver metastases on a  background of cirrhosis, overall grossly similar to prior, without definite interval progression identified on this noncontrast exam. The spleen is enlarged. 2. Mild left hydronephrosis and hydroureter with perinephric stranding and edema, which may reflect extrinsic compression or reflux; no ureteral calculi identified. 3. Diffuse osseous metastatic disease with heterogeneous sclerosis throughout the visualized skeleton.      Disposition Plan  :    Status is: Inpatient   DVT Prophylaxis  :    Place and maintain sequential compression device Start: 12/29/23 1106 Place and maintain sequential compression device Start: 12/29/23 0653   Lab Results  Component Value Date   PLT 244 12/30/2023    Diet :  Diet Order     None        Inpatient Medications  Scheduled Meds:  Chlorhexidine  Gluconate Cloth  6 each Topical Daily   nicotine   14 mg Transdermal Daily   sodium chloride  flush  10-40 mL Intracatheter Q12H   Continuous Infusions:  sodium chloride      cefTRIAXone  (ROCEPHIN )  IV 2 g (12/30/23 0513)   valproate sodium  250 mg (12/30/23 0626)   vancomycin  750 mg (12/30/23 0813)   PRN Meds:.acetaminophen , HYDROmorphone  (DILAUDID ) injection, lidocaine -prilocaine , melatonin, pantoprazole , polyethylene glycol, prochlorperazine , prochlorperazine        Objective:   Vitals:   12/29/23 1934 12/30/23 0009 12/30/23 0422 12/30/23 0800  BP:  111/74 98/60 116/75  Pulse:    84  Resp:    16  Temp:  (!) 97.3 F (36.3 C) 97.6 F (36.4 C) 98 F (36.7 C)  TempSrc:  Axillary Axillary Oral  SpO2: (!) 88%   92%  Weight:      Height:        Wt Readings from Last 3 Encounters:  12/29/23 70.8 kg  12/17/23 70.8 kg  12/03/23 69.7 kg     Intake/Output Summary (Last 24 hours) at 12/30/2023 1050 Last data filed  at 12/30/2023 0437 Gross per 24 hour  Intake 150.08 ml  Output --  Net 150.08 ml     Physical Exam  Awake Alert, No new F.N deficits, Normal  affect [place].AT,PERRAL Supple Neck, No JVD,   Symmetrical Chest wall movement, Good air movement bilaterally, CTAB RRR,No Gallops,Rubs or new Murmurs,  +ve B.Sounds, Abd Soft, No tenderness,   No Cyanosis, Clubbing or edema        Data Review:    Recent Labs  Lab 12/24/23 0811 12/28/23 2023 12/28/23 2057 12/29/23 0703 12/30/23 0556  WBC 4.5 11.6*  --  15.3* 9.9  HGB 10.8* 11.1* 11.6* 11.5* 10.9*  HCT 34.7* 35.3* 34.0* 35.7* 35.2*  PLT 225 231  --  244 244  MCV 98.6 99.4  --  96.5 98.6  MCH 30.7 31.3  --  31.1 30.5  MCHC 31.1 31.4  --  32.2 31.0  RDW 16.7* 16.5*  --  16.8* 17.5*  LYMPHSABS 1.0 2.1  --  0.6* 1.2  MONOABS 0.4 0.7  --  0.8 0.6  EOSABS 0.1 0.2  --  0.2 0.1  BASOSABS 0.0 0.0  --  0.0 0.0    Recent Labs  Lab 12/24/23 0811 12/28/23 2023 12/28/23 2057 12/30/23 0556  NA 139 140 141 146*  K 3.6 3.2* 3.2* 3.5  CL 104 103 104 109  CO2 22 20*  --  24  ANIONGAP 13 18*  --  13  GLUCOSE 135* 148* 149* 86  BUN 9 8 7 10   CREATININE 0.56 0.56 0.60 0.91  AST 28 32  --  19  ALT 16 21  --  17  ALKPHOS 210* 258*  --  165*  BILITOT <0.2 <0.2  --  0.7  ALBUMIN 3.8 3.9  --  2.7*  CRP  --   --   --  14.7*  PROCALCITON  --   --   --  0.52  MG 1.6* 1.9  --  2.0  PHOS 3.8  --   --  5.1*  CALCIUM 8.7* 9.2  --  8.0*      Recent Labs  Lab 12/24/23 0811 12/28/23 2023 12/30/23 0556  CRP  --   --  14.7*  PROCALCITON  --   --  0.52  MG 1.6* 1.9 2.0  CALCIUM 8.7* 9.2 8.0*    --------------------------------------------------------------------------------------------------------------- No results found for: CHOL, HDL, LDLCALC, LDLDIRECT, TRIG, CHOLHDL  No results found for: HGBA1C No results for input(s): TSH, T4TOTAL, FREET4, T3FREE, THYROIDAB in the last 72 hours. No results for input(s): VITAMINB12, FOLATE, FERRITIN, TIBC, IRON , RETICCTPCT in the last 72  hours. ------------------------------------------------------------------------------------------------------------------ Cardiac Enzymes No results for input(s): CKMB, TROPONINI, MYOGLOBIN in the last 168 hours.  Invalid input(s): CK  Micro Results No results found for this or any previous visit (from the past 240 hours).  Radiology Report MR Brain W and Wo Contrast Result Date: 12/30/2023 EXAM: MRI BRAIN WITH AND WITHOUT CONTRAST 12/30/2023 10:09:00 AM TECHNIQUE: Multiplanar multisequence MRI of the head/brain was performed with and without the administration of intravenous contrast. 8 mL gadobutrol  (GADAVIST ) 1 MMOL/ML IV solution was administered. COMPARISON: Head CT without contrast 12/28/2023. CLINICAL HISTORY: 58 year old female. Seizure, new-onset, no history of trauma; new seizure, history of metastatic breast cancer, query brain metastases. FINDINGS: BRAIN AND VENTRICLES: No acute infarct. No acute intracranial hemorrhage. No midline shift or significant intracranial mass effect. No hydrocephalus. The sella is unremarkable. Normal flow voids. Following contrast, the major dural venous sinuses are enhancing and appear to  be patent. abnormal enhancement in the posterior left hemisphere affecting the parietal and superior occipital lobes is in part contiguous with the overlying dura (series 12 image 84) but also has a multinodular and gyriform pattern (series 12 image 74 and series 13 image 9) indicating leptomeningeal tumor involvement. Additional subcentimeter parenchymal versus leptomeningeal biparietal metastases are seen on series 12 image 89 and series 13 image 9. There is also abnormal cerebellar leptomeningeal enhancement in the left hemisphere on series 12 image 21. Loss of the normal FLAIR signal suppression in the sulci here, but minimal associated cerebral edema at this time, primarily in the occipital lobe (series 6 images 15 and 12). SWI reveals mild hemosiderin in  association with some of the bulky leptomeningeal disease (series 7 image 50) in the left occipital lobe. No ependymal enhancement. No abnormality diffusion. Thin slice coronal images are motion degraded, the hippocampal formations and mesial temporal lobes appear grossly symmetric and negative. No other acute or chronic cerebral blood products identified. Deep gray nuclei and brainstem appear negative. ORBITS: No acute abnormality. No orbital metastases identified. SINUSES: No acute abnormality. Paranasal sinuses and mastoids are well aerated. BONES AND SOFT TISSUES: Bone marrow signal is diffusely abnormal, and suggests diffuse skull and visible cervical spine skeletal metastatic disease. No acute soft tissue abnormality. No scalp metastases identified. IMPRESSION: 1. 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 2. Evidence of overlying diffuse skeletal metastatic disease. 3. Minimal associated cerebral edema at this time. No intracranial mass effect. Electronically signed by: Helayne Hurst MD 12/30/2023 10:40 AM EST RP Workstation: [id number]   Overnight EEG with video Result Date: 12/30/2023 Shelton Arlin KIDD, MD     12/30/2023  7:25 AM Patient Name: SAFIATOU ISLAM MRN: [id number] Epilepsy Attending: Arlin KIDD Shelton Referring Physician/Provider: Matthews Elida HERO, MD Duration: 12/29/2023 1407 to 112/05/2023 0720 Patient history: 58 yo woman with hx breast cancer metastatic to bone and liver transferred from [facility] after first-time seizure. EEG to evaluate for seizure Level of alertness: Awake, asleep AEDs during EEG study: VPA Technical aspects: This EEG study was done with scalp electrodes positioned according to the 10-20 International system of electrode placement. Electrical activity was reviewed with band pass filter of 1-70Hz , sensitivity of 7 uV/mm, display speed of 79mm/sec  with a 60Hz  notched filter applied as appropriate. EEG data were recorded continuously and digitally stored.  Video monitoring was available and reviewed as appropriate. Description: The posterior dominant rhythm consists of 9-10 Hz activity of moderate voltage (25-35 uV) seen predominantly in posterior head regions, symmetric and reactive to eye opening and eye closing. Sleep was characterized by vertex waves, sleep spindles (12 to 14 Hz), maximal frontocentral region.  There is intermittent generalized 3 to 6 Hz theta-delta slowing. Hyperventilation and photic stimulation were not performed.   ABNORMALITY - Intermittent slow, generalized IMPRESSION: This study is suggestive of mild generalized cerebral dysfunction (encephalopathy). No seizures or epileptiform discharges were seen throughout the recording. Arlin KIDD Shelton   EEG adult Result Date: 12/29/2023 Gregg Lek, MD     12/29/2023  5:22 PM Patient Name: VIDYA BAMFORD MRN: [id number] Epilepsy Attending: Lek Gregg Referring Physician/Provider: No ref. provider found     Date: 12/29/2023 Duration: 22 minutes Patient history: 58 year old woman with metastatic brain cancer who presents with new seizure. Level of alertness: Awake, drowsy AEDs during EEG study: Depakote  Technical aspects: This EEG study was done  with scalp electrodes positioned according to the 10-20 International system of electrode placement. Electrical activity was reviewed with band pass filter of 1-70Hz , sensitivity of 7 uV/mm, display speed of 38mm/sec with a 60Hz  notched filter applied as appropriate. EEG data were recorded continuously and digitally stored.  Video monitoring was available and reviewed as appropriate. Description: EEG showed continuous generalized polymorphic sharply contoured 3 to 6 Hz theta-delta slowing. Hyperventilation and photic stimulation were not performed.   ABNORMALITY - Continuous slow, generalized IMPRESSION: This study is suggestive of mild to  moderate diffuse encephalopathy, nonspecific etiology but likely related to sedation, toxic-metabolic etiology. No seizures or epileptiform discharges were seen throughout the recording. Pastor Falling MD Neurology    CT ABDOMEN PELVIS WO CONTRAST Result Date: 12/29/2023 EXAM: CT ABDOMEN AND PELVIS WITHOUT CONTRAST 12/29/2023 12:50:58 PM TECHNIQUE: CT of the abdomen and pelvis was performed without the administration of intravenous contrast. Multiplanar reformatted images are provided for review. Automated exposure control, iterative reconstruction, and/or weight-based adjustment of the mA/kV was utilized to reduce the radiation dose to as low as reasonably achievable. COMPARISON: PET CT 11/21/2023. CLINICAL HISTORY: Abdominal pain, acute, nonlocalized. FINDINGS: LOWER CHEST: Atelectasis and scarring in the lung bases. Cardiac enlargement. LIVER: Cirrhotic changes in the liver with diffusely nodular contour. Poorly defined nodular hypodense lesions throughout the liver consistent with known metastatic disease. These are poorly demonstrated on noncontrast imaging but appear grossly similar to the previous study. GALLBLADDER AND BILE DUCTS: Gallbladder and bile ducts are normal. SPLEEN: The spleen is enlarged without focal lesion. PANCREAS: The pancreas is normal. ADRENAL GLANDS: The adrenal glands are normal. KIDNEYS, URETERS AND BLADDER: Mild hydronephrosis and hydroureter on the left with stranding and edema around the left kidney. No ureteral stones are demonstrated. This could be due to recently passed stone, extrinsic compression or reflux. The bladder is diffusely distended without wall thickening or intraluminal filling defects. No bladder stones. GI AND BOWEL: Stomach, small bowel, and colon are not abnormally distended. No wall thickening or inflammatory stranding are appreciated. The appendix is surgically absent. PERITONEUM AND RETROPERITONEUM: No ascites. No free air. VASCULATURE: Aorta is normal in  caliber. LYMPH NODES: No lymphadenopathy. REPRODUCTIVE ORGANS: The uterus is surgically absent. No abnormal adnexal masses. BONES AND SOFT TISSUES: Diffuse heterogeneous sclerosis throughout the visualized skeleton consistent with diffuse bone metastasis. No focal soft tissue abnormality. IMPRESSION: 1. Liver metastases on a background of cirrhosis, overall grossly similar to prior, without definite interval progression identified on this noncontrast exam. The spleen is enlarged. 2. Mild left hydronephrosis and hydroureter with perinephric stranding and edema, which may reflect extrinsic compression or reflux; no ureteral calculi identified. 3. Diffuse osseous metastatic disease with heterogeneous sclerosis throughout the visualized skeleton. Electronically signed by: Elsie Gravely MD 12/29/2023 01:05 PM EST RP Workstation: [id number]   CT Head Wo Contrast Result Date: 12/28/2023 EXAM: CT HEAD WITHOUT CONTRAST 12/28/2023 08:43:23 PM TECHNIQUE: CT of the head was performed without the administration of intravenous contrast. Automated exposure control, iterative reconstruction, and/or weight based adjustment of the mA/kV was utilized to reduce the radiation dose to as low as reasonably achievable. COMPARISON: 05/03/2011 CLINICAL HISTORY: Seizure, new-onset, no history of trauma. FINDINGS: BRAIN AND VENTRICLES: No acute hemorrhage. No evidence of acute infarct. No extra-axial collection. No mass effect or midline shift. ORBITS: No acute abnormality. SINUSES: No acute abnormality. SOFT TISSUES AND SKULL: No acute soft tissue abnormality. No skull fracture. IMPRESSION: 1. No acute intracranial abnormality. Electronically signed by: Oneil Devonshire MD 12/28/2023 08:51 PM EST RP  Workstation: [id number]     Signature  -   Lavada Stank M.D on 12/30/2023 at 10:50 AM   -  To page go to [email or web address]

## 2023-12-30 NOTE — Progress Notes (Signed)
 LTM maint complete - no skin breakdown under:  Fp1 A1  Serviced SARA LEE

## 2023-12-30 NOTE — Progress Notes (Signed)
 NEUROLOGY CONSULT FOLLOW UP NOTE   Date of service: December 30, 2023 Patient Name: Monica Soto MRN:  [id number] DOB:  [birth year]  Interval Hx/subjective   Seen in room with family at the bedside.  Primary care team at the bedside as well.  Plan for MRI today, she will need Ativan  for claustrophobia.  Neuroexam is nonfocal.  She does not remember anything that happened yesterday she denies a history of seizures.  Family states that she seems to be back to baseline.  Vitals   Vitals:   12/29/23 1438 12/29/23 1934 12/30/23 0009 12/30/23 0422  BP: 135/78 (!) 144/84 111/74 98/60  Pulse: 92     Resp: 17     Temp: 98.1 F (36.7 C) 97.7 F (36.5 C) (!) 97.3 F (36.3 C) 97.6 F (36.4 C)  TempSrc: Axillary Oral Axillary Axillary  SpO2:  (!) 88%    Weight: 70.8 kg     Height: 5' 5 (1.651 m)        Body mass index is 25.97 kg/m.  Physical Exam   Constitutional: Appears acutely ill Psych: Affect appropriate to situation.  Eyes: No scleral injection.  HENT: No OP obstrucion.  Head: Normocephalic.  Cardiovascular: Normal rate and regular rhythm.  Respiratory: Effort normal, non-labored breathing.  GI: Soft.  No distension. There is no tenderness.  Skin: WDI.   Neurologic Examination    Neuro: Mental Status: Patient is awake, alert, oriented to person, place, month, year, and situation. She does not remember anything that happened yesterday.  Can give a coherent health history. No signs of aphasia or neglect Cranial Nerves: II: Visual Fields are full. Pupils are equal, round, and reactive to light.   III,IV, VI: EOMI without ptosis or diploplia.  V: Facial sensation is symmetric to temperature VII: Facial movement is symmetric resting and smiling VIII: Hearing is intact to voice X: Palate elevates symmetrically XI: Shoulder shrug is symmetric. XII: Tongue protrudes midline without atrophy or fasciculations.  Motor: Tone is normal. Bulk is normal.  Strength limited by  pain.  No drift in extremities however left arm does seem slightly weaker than right. Sensory: Sensation is symmetric to light touch in the arms and legs.  Cerebellar: FNF and HKS are intact bilaterally   Medications  Current Facility-Administered Medications:    acetaminophen  (TYLENOL ) tablet 500 mg, 500 mg, Oral, Q6H PRN, Hall, Carole N, DO   cefTRIAXone  (ROCEPHIN ) 2 g in sodium chloride  0.9 % 100 mL IVPB, 2 g, Intravenous, Q24H, Hall, Carole N, DO, Last Rate: 200 mL/hr at 12/30/23 0513, 2 g at 12/30/23 9486   Chlorhexidine  Gluconate Cloth 2 % PADS 6 each, 6 each, Topical, Daily, Leotis Bogus, MD, 6 each at 12/29/23 1842   HYDROmorphone  (DILAUDID ) injection 1 mg, 1 mg, Intravenous, Q4H PRN, Leotis Bogus, MD, 1 mg at 12/30/23 0252   melatonin tablet 5 mg, 5 mg, Oral, QHS PRN, Shona, Carole N, DO   nicotine  (NICODERM CQ  - dosed in mg/24 hours) patch 14 mg, 14 mg, Transdermal, Daily, Hall, Carole N, DO, 14 mg at 12/29/23 0300   polyethylene glycol (MIRALAX  / GLYCOLAX ) packet 17 g, 17 g, Oral, Daily PRN, Hall, Carole N, DO   prochlorperazine  (COMPAZINE ) injection 5 mg, 5 mg, Intravenous, Q6H PRN, Hall, Carole N, DO, 5 mg at 12/29/23 0058   sodium chloride  flush (NS) 0.9 % injection 10-40 mL, 10-40 mL, Intracatheter, Q12H, Khatri, Pardeep, MD   sodium chloride  flush (NS) 0.9 % injection 10-40 mL, 10-40 mL, Intracatheter, PRN,  Leotis Bogus, MD   valproate (DEPACON ) 250 mg in dextrose  5 % 50 mL IVPB, 250 mg, Intravenous, Q6H, Matthews Elida HERO, MD, Last Rate: 52.5 mL/hr at 12/30/23 0626, 250 mg at 12/30/23 9373   vancomycin  (VANCOREADY) IVPB 750 mg/150 mL, 750 mg, Intravenous, Q12H, Leotis Bogus, MD, Stopped at 12/29/23 2121  Facility-Administered Medications Ordered in Other Encounters:    0.9 %  sodium chloride  infusion, , Intravenous, Continuous, [name], MD, Stopped at 09/24/23 1259  Labs and Diagnostic Imaging   CBC:  Recent Labs  Lab 12/29/23 0703 12/30/23 0556   WBC 15.3* 9.9  NEUTROABS 13.8* 8.0*  HGB 11.5* 10.9*  HCT 35.7* 35.2*  MCV 96.5 98.6  PLT 244 244    Basic Metabolic Panel:  Lab Results  Component Value Date   NA 146 (H) 12/30/2023   K 3.5 12/30/2023   CO2 24 12/30/2023   GLUCOSE 86 12/30/2023   BUN 10 12/30/2023   CREATININE 0.91 12/30/2023   CALCIUM 8.0 (L) 12/30/2023   GFRNONAA >60 12/30/2023   GFRAA >60 03/29/2015   Lipid Panel: No results found for: LDLCALC HgbA1c: No results found for: HGBA1C Urine Drug Screen:     Component Value Date/Time   LABOPIA NEGATIVE 12/28/2023 2259   COCAINSCRNUR NEGATIVE 12/28/2023 2259   LABBENZ NEGATIVE 12/28/2023 2259   AMPHETMU NEGATIVE 12/28/2023 2259   THCU NEGATIVE 12/28/2023 2259   LABBARB NEGATIVE 12/28/2023 2259    Alcohol Level     Component Value Date/Time   ETH <15 12/28/2023 2023   INR  Lab Results  Component Value Date   INR 1.0 12/07/2022   APTT No results found for: APTT AED levels: No results found for: PHENYTOIN, ZONISAMIDE, LAMOTRIGINE, LEVETIRACETA  CT Head without contrast(Personally reviewed): No acute intracranial abnormality.   CT CAP 1. Liver metastases on a background of cirrhosis, overall grossly similar to prior, without definite interval progression identified on this noncontrast exam. The spleen is enlarged. 2. Mild left hydronephrosis and hydroureter with perinephric stranding and edema, which may reflect extrinsic compression or reflux; no ureteral calculi identified. 3. Diffuse osseous metastatic disease with heterogeneous sclerosis throughout the visualized skeleton.  MRI Brain w wo (Personally reviewed): Pending   rEEG:  This study is suggestive of mild generalized cerebral dysfunction (encephalopathy). No seizures or epileptiform discharges were seen throughout the recording.   Assessment   Monica Soto is a 58 y.o. female with a hx breast cancer metastatic to bone and liver transferred from [facility] after  first-time seizure. On arrival to [facility] she was altered and agitated, likely post-ictal with reaction to keppra . EEG remains in place. Plan for MRI brain today. UA not suggestive of UTI but she does have mod hgb in her urine.   Recommendations  - MRI Brain w wo  - ativan  to be ordered by primary team. EEG leads are MR compatible  - Valproate 250mg  q6hr ______________________________________________________________________   Bonney Jorene Last, NP [facility]

## 2023-12-30 NOTE — Procedures (Signed)
 Patient Name: CAELI LINEHAN  MRN: [id number]  Epilepsy Attending: Arlin MALVA Krebs  Referring Physician/Provider: Matthews Elida HERO, MD  Duration: 12/29/2023 1407 to 112/05/2023 0720  Patient history: 58 yo woman with hx breast cancer metastatic to bone and liver transferred from [facility] after first-time seizure. EEG to evaluate for seizure  Level of alertness: Awake, asleep  AEDs during EEG study: VPA  Technical aspects: This EEG study was done with scalp electrodes positioned according to the 10-20 International system of electrode placement. Electrical activity was reviewed with band pass filter of 1-70Hz , sensitivity of 7 uV/mm, display speed of 37mm/sec with a 60Hz  notched filter applied as appropriate. EEG data were recorded continuously and digitally stored.  Video monitoring was available and reviewed as appropriate.  Description: The posterior dominant rhythm consists of 9-10 Hz activity of moderate voltage (25-35 uV) seen predominantly in posterior head regions, symmetric and reactive to eye opening and eye closing. Sleep was characterized by vertex waves, sleep spindles (12 to 14 Hz), maximal frontocentral region.  There is intermittent generalized 3 to 6 Hz theta-delta slowing. Hyperventilation and photic stimulation were not performed.     ABNORMALITY - Intermittent slow, generalized  IMPRESSION: This study is suggestive of mild generalized cerebral dysfunction (encephalopathy). No seizures or epileptiform discharges were seen throughout the recording.  Shanira Tine O Navah Grondin

## 2023-12-30 NOTE — Evaluation (Signed)
 Physical Therapy Evaluation Patient Details Name: Monica Soto MRN: [id number] DOB: 1965/12/11 Today's Date: 12/30/2023  History of Present Illness  Pt is a 58 y/o F admitted on 12/28/23 after presenting with c/o not feeling well, severe diarrhea, nausea; family also reports blank stare & eye fluttering lasting a few seconds, later another episode of eyes twitching & arms & legs shaking. Pt is being treated for new onset seizure. 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 Evidence of overlying diffuse skeletal metastatic disease. PMH: metastatic R breast CA to bones & liver, ER/PR positive HER2 negative, depression, IBS  Clinical Impression  Pt seen for PT evaluation with pt received asleep but awakened & agreeable to tx, spouse & daughter present with daughter actively assisting during session. Prior to admission pt was living with spouse on 1 level home with steps without rails to enter, independent with mobility & ADLs. On this date, pt completes bed mobility with supervision & hospital bed features, transfers with min assist. Gait deferred 2/2 lethargy/fatigue suspect 2/2 meds & pt fatigued after using BSC. Recommend ongoing PT services to progress mobility as able.      If plan is discharge home, recommend the following: A little help with walking and/or transfers;A little help with bathing/dressing/bathroom;Help with stairs or ramp for entrance;Assistance with cooking/housework;Assist for transportation   Can travel by private vehicle        Equipment Recommendations Rolling walker (2 wheels);BSC/3in1  Recommendations for Other Services       Functional Status Assessment Patient has had a recent decline in their functional status and demonstrates the ability to make significant improvements in function in a reasonable and predictable amount of  time.     Precautions / Restrictions Precautions Precautions: Fall Precaution/Restrictions Comments: seizures Restrictions Weight Bearing Restrictions Per Provider Order: No      Mobility  Bed Mobility Overal bed mobility: Needs Assistance Bed Mobility: Supine to Sit, Sit to Supine     Supine to sit: Supervision, HOB elevated, Used rails (exit R side of bed) Sit to supine: Supervision, Used rails, HOB elevated        Transfers Overall transfer level: Needs assistance Equipment used: None Transfers: Sit to/from Stand, Bed to chair/wheelchair/BSC Sit to Stand: Min assist   Step pivot transfers: Min assist (bed<>BSC on L)            Ambulation/Gait                  Stairs            Wheelchair Mobility     Tilt Bed    Modified Rankin (Stroke Patients Only)       Balance Overall balance assessment: Needs assistance Sitting-balance support: Feet supported Sitting balance-Leahy Scale: Fair     Standing balance support: During functional activity, No upper extremity supported Standing balance-Leahy Scale: Poor                               Pertinent Vitals/Pain Pain Assessment Pain Assessment: No/denies pain    Home Living Family/patient expects to be discharged to:: Private residence Living Arrangements: Spouse/significant other Available Help at Discharge: Family (spouse planning to take some time off work to assist pt at d/c, daughter available as well) Type of Home: House Home Access: Stairs to enter Entrance Stairs-Rails: None Entrance Stairs-Number of  Steps: 4   Home Layout: One level        Prior Function               Mobility Comments: ambulatory without AD, driving ADLs Comments: independent (gets in/out of tub to take bath), dresses without assistance.     Extremity/Trunk Assessment   Upper Extremity Assessment Upper Extremity Assessment: Overall WFL for tasks assessed    Lower Extremity  Assessment Lower Extremity Assessment: Generalized weakness       Communication   Communication Communication: No apparent difficulties    Cognition Arousal: Lethargic, Suspect due to medications Behavior During Therapy: WFL for tasks assessed/performed   PT - Cognitive impairments: No apparent impairments                       PT - Cognition Comments: incontinent void upon standing, able to report need to void later in session Following commands: Impaired Following commands impaired: Follows one step commands with increased time     Cueing Cueing Techniques: Verbal cues     General Comments General comments (skin integrity, edema, etc.): incontinent & continent void, assisted pt with peri hygiene & changing into clean gown, daughter actively assisting during session    Exercises     Assessment/Plan    PT Assessment Patient needs continued PT services  PT Problem List Decreased strength;Decreased activity tolerance;Decreased balance;Decreased mobility;Decreased safety awareness;Decreased knowledge of use of DME       PT Treatment Interventions DME instruction;Balance training;Gait training;Neuromuscular re-education;Stair training;Functional mobility training;Therapeutic activities;Therapeutic exercise;Patient/family education    PT Goals (Current goals can be found in the Care Plan section)  Acute Rehab PT Goals Patient Stated Goal: get better PT Goal Formulation: With patient/family Time For Goal Achievement: 01/13/24 Potential to Achieve Goals: Fair    Frequency Min 2X/week     Co-evaluation               AM-PAC PT 6 Clicks Mobility  Outcome Measure Help needed turning from your back to your side while in a flat bed without using bedrails?: None Help needed moving from lying on your back to sitting on the side of a flat bed without using bedrails?: A Little Help needed moving to and from a bed to a chair (including a wheelchair)?: A  Little Help needed standing up from a chair using your arms (e.g., wheelchair or bedside chair)?: A Little Help needed to walk in hospital room?: A Little Help needed climbing 3-5 steps with a railing? : A Lot 6 Click Score: 18    End of Session   Activity Tolerance: Patient tolerated treatment well;Patient limited by fatigue Patient left: in bed;with call bell/phone within reach;with bed alarm set;with family/visitor present Nurse Communication: Mobility status (urine red, pt weaned to room air) PT Visit Diagnosis: Unsteadiness on feet (R26.81);Other abnormalities of gait and mobility (R26.89);Difficulty in walking, not elsewhere classified (R26.2)    Time: 8947-8883 PT Time Calculation (min) (ACUTE ONLY): 24 min   Charges:   PT Evaluation $PT Eval Moderate Complexity: 1 Mod   PT General Charges $$ ACUTE PT VISIT: 1 Visit         Richerd Pinal, PT, DPT 12/30/23, 11:26 AM   Richerd CHRISTELLA Pinal 12/30/2023, 11:25 AM

## 2023-12-30 NOTE — Evaluation (Signed)
 Clinical/Bedside Swallow Evaluation Patient Details  Name: Monica Soto MRN: [id number] Date of Birth: November 18, 1965  Today's Date: 12/30/2023 Time: SLP Start Time (ACUTE ONLY): 1343 SLP Stop Time (ACUTE ONLY): 1356 SLP Time Calculation (min) (ACUTE ONLY): 13 min  Past Medical History:  Past Medical History:  Diagnosis Date   Depression    Diabetes mellitus without complication (HCC)    Erosive esophagitis 04/26/2010   EGD by Dr. Shaaron, small hiatal hernia   GERD (gastroesophageal reflux disease)    History of stomach ulcers    IBS (irritable bowel syndrome)    Diarrhea predominant   Microscopic colitis 04/26/2010   Colonoscopy by Dr. Shaaron, good response with Entocort   Tubular adenoma of colon 04/26/2010   Junction of descending and sigmoid 40 CM from anus   Past Surgical History:  Past Surgical History:  Procedure Laterality Date   ABDOMINAL HYSTERECTOMY     APPENDECTOMY  2/11   Dr. Floria with a delayed closure   BREAST BIOPSY Right 06/22/2022   US  RT BREAST BX W LOC DEV 1ST LESION IMG BX SPEC US  GUIDE 06/22/2022 [facility]MO[facility]   PORTACATH PLACEMENT N/A 07/16/2022   Procedure: INSERTION PORT-A-CATH;  Surgeon: [name], MD;  Location: [facility];  Service: General;  Laterality: N/A;   TONSILLECTOMY     TUBAL LIGATION     HPI:  58 yo female currently undergoing chemotherapy for metastatic breast cancer admitted 11/22 with severe diarrhea and nausea after episodes of blank staring and eye fluttering and eye/extremity twitching. 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 EEG so far has not shown seizures. PMH: metastatic R breast cancer to the bones and liver, ER/PR positive HER2 negative, depression, IBS    Assessment / Plan / Recommendation  Clinical Impression  Pt is alert and following commands. She fed herself regular  solids, initiating prompt and complete oral clearance. No signs clinically concerning for aspiration followed consecutive sips of thin liquids. Recommend she resume POs with regular solids and thin liquids. Increased supervision may be beneficial initially given potential for fluctuating mentation. Otherwise, ongoing SLP f/u is not needed acutely.  SLP Visit Diagnosis: Dysphagia, unspecified (R13.10)    Aspiration Risk  Mild aspiration risk    Diet Recommendation Regular;Thin liquid    Liquid Administration via: Cup;Straw Medication Administration: Whole meds with liquid Supervision: Patient able to self feed;Intermittent supervision to cue for compensatory strategies Compensations: Slow rate;Small sips/bites Postural Changes: Seated upright at 90 degrees    Other  Recommendations Oral Care Recommendations: Oral care BID     Assistance Recommended at Discharge    Functional Status Assessment Patient has not had a recent decline in their functional status  Frequency and Duration            Prognosis Prognosis for improved oropharyngeal function: Good Barriers to Reach Goals: Severity of deficits      Swallow Study   General HPI: 58 yo female currently undergoing chemotherapy for metastatic breast cancer admitted 11/22 with severe diarrhea and nausea after episodes of blank staring and eye fluttering and eye/extremity twitching. 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 EEG so far have not shown seizures. PMH: metastatic R breast cancer to the bones and liver, ER/PR positive HER2 negative, depression, IBS Type of Study: Bedside Swallow Evaluation Previous Swallow Assessment:  none in chart Diet Prior to this Study: NPO Temperature Spikes Noted: No Respiratory Status: Room air History of Recent Intubation: No Behavior/Cognition: Alert;Cooperative Oral  Cavity Assessment: Within Functional Limits Oral Care Completed by SLP: No Oral Cavity - Dentition: Adequate natural dentition Vision: Functional for self-feeding Self-Feeding Abilities: Able to feed self Patient Positioning: Upright in bed Baseline Vocal Quality: Normal Volitional Cough: Strong Volitional Swallow: Able to elicit    Oral/Motor/Sensory Function Overall Oral Motor/Sensory Function: Within functional limits   Ice Chips Ice chips: Not tested   Thin Liquid Thin Liquid: Within functional limits Presentation: Straw    Nectar Thick Nectar Thick Liquid: Not tested   Honey Thick Honey Thick Liquid: Not tested   Puree Puree: Not tested   Solid     Solid: Within functional limits Presentation: Self Fed      Damien Blumenthal, M.A., CCC-SLP Speech Language Pathology, Acute Rehabilitation Services  Secure Chat preferred [phone]  12/30/2023,2:42 PM

## 2023-12-31 ENCOUNTER — Other Ambulatory Visit: Payer: Self-pay | Admitting: Radiation Oncology

## 2023-12-31 ENCOUNTER — Inpatient Hospital Stay (HOSPITAL_COMMUNITY)

## 2023-12-31 ENCOUNTER — Ambulatory Visit: Admitting: Radiation Oncology

## 2023-12-31 ENCOUNTER — Other Ambulatory Visit: Payer: Self-pay | Admitting: Oncology

## 2023-12-31 DIAGNOSIS — C50911 Malignant neoplasm of unspecified site of right female breast: Secondary | ICD-10-CM | POA: Insufficient documentation

## 2023-12-31 DIAGNOSIS — C50411 Malignant neoplasm of upper-outer quadrant of right female breast: Secondary | ICD-10-CM

## 2023-12-31 DIAGNOSIS — R569 Unspecified convulsions: Secondary | ICD-10-CM | POA: Diagnosis not present

## 2023-12-31 LAB — COMPREHENSIVE METABOLIC PANEL WITH GFR
ALT: 16 U/L (ref 0–44)
AST: 15 U/L (ref 15–41)
Albumin: 2.7 g/dL — ABNORMAL LOW (ref 3.5–5.0)
Alkaline Phosphatase: 150 U/L — ABNORMAL HIGH (ref 38–126)
Anion gap: 13 (ref 5–15)
BUN: 7 mg/dL (ref 6–20)
CO2: 24 mmol/L (ref 22–32)
Calcium: 8.1 mg/dL — ABNORMAL LOW (ref 8.9–10.3)
Chloride: 107 mmol/L (ref 98–111)
Creatinine, Ser: 0.78 mg/dL (ref 0.44–1.00)
GFR, Estimated: >60 mL/min (ref >60)
Glucose, Bld: 144 mg/dL — ABNORMAL HIGH (ref 70–99)
Potassium: 2.9 mmol/L — ABNORMAL LOW (ref 3.5–5.1)
Sodium: 144 mmol/L (ref 135–145)
Total Bilirubin: 0.7 mg/dL (ref 0.0–1.2)
Total Protein: 5.5 g/dL — ABNORMAL LOW (ref 6.5–8.1)

## 2023-12-31 LAB — CBC WITH DIFFERENTIAL/PLATELET
Abs Immature Granulocytes: 0.11 K/uL — ABNORMAL HIGH (ref 0.00–0.07)
Basophils Absolute: 0.1 K/uL (ref 0.0–0.1)
Basophils Relative: 1 %
Eosinophils Absolute: 0.2 K/uL (ref 0.0–0.5)
Eosinophils Relative: 1 %
HCT: 32.8 % — ABNORMAL LOW (ref 36.0–46.0)
Hemoglobin: 10.5 g/dL — ABNORMAL LOW (ref 12.0–15.0)
Immature Granulocytes: 1 %
Lymphocytes Relative: 12 %
Lymphs Abs: 1.3 K/uL (ref 0.7–4.0)
MCH: 31.3 pg (ref 26.0–34.0)
MCHC: 32 g/dL (ref 30.0–36.0)
MCV: 97.9 fL (ref 80.0–100.0)
Monocytes Absolute: 0.9 K/uL (ref 0.1–1.0)
Monocytes Relative: 8 %
Neutro Abs: 8.6 K/uL — ABNORMAL HIGH (ref 1.7–7.7)
Neutrophils Relative %: 77 %
Platelets: 251 K/uL (ref 150–400)
RBC: 3.35 MIL/uL — ABNORMAL LOW (ref 3.87–5.11)
RDW: 17.4 % — ABNORMAL HIGH (ref 11.5–15.5)
Smear Review: NORMAL
WBC: 11.1 K/uL — ABNORMAL HIGH (ref 4.0–10.5)
nRBC: 0 % (ref 0.0–0.2)

## 2023-12-31 LAB — MAGNESIUM: Magnesium: 2 mg/dL (ref 1.7–2.4)

## 2023-12-31 MED ORDER — POTASSIUM CHLORIDE CRYS ER 20 MEQ PO TBCR
40.0000 meq | EXTENDED_RELEASE_TABLET | Freq: Two times a day (BID) | ORAL | Status: DC
  Administered 2023-12-31: 40 meq via ORAL
  Filled 2023-12-31: qty 2

## 2023-12-31 MED ORDER — DEXAMETHASONE 4 MG PO TABS: 4.0000 mg | ORAL_TABLET | Freq: Four times a day (QID) | ORAL | Status: DC

## 2023-12-31 MED ORDER — DEXAMETHASONE 4 MG PO TABS: 4.0000 mg | ORAL_TABLET | Freq: Four times a day (QID) | ORAL | 1 refills | Status: DC

## 2023-12-31 MED ORDER — POTASSIUM CHLORIDE 10 MEQ/100ML IV SOLN
10.0000 meq | INTRAVENOUS | Status: AC
Start: 2023-12-31 — End: 2023-12-31
  Administered 2023-12-31 (×3): 10 meq via INTRAVENOUS
  Filled 2023-12-31 (×3): qty 100

## 2023-12-31 MED ORDER — LORAZEPAM 2 MG/ML IJ SOLN
2.0000 mg | Freq: Once | INTRAMUSCULAR | Status: AC | PRN
  Administered 2023-12-31: 2 mg via INTRAVENOUS
  Filled 2023-12-31 (×2): qty 1

## 2023-12-31 MED ORDER — POTASSIUM CHLORIDE CRYS ER 20 MEQ PO TBCR
20.0000 meq | EXTENDED_RELEASE_TABLET | Freq: Once | ORAL | Status: AC
  Administered 2023-12-31: 20 meq via ORAL
  Filled 2023-12-31: qty 1

## 2023-12-31 MED ORDER — LORAZEPAM 2 MG/ML IJ SOLN: 1.0000 mg | INTRAMUSCULAR | Status: DC | PRN

## 2023-12-31 MED ORDER — LORAZEPAM 0.5 MG PO TABS: ORAL_TABLET | ORAL | 0 refills | Status: DC

## 2023-12-31 MED ORDER — DEXAMETHASONE 4 MG PO TABS
12.0000 mg | ORAL_TABLET | Freq: Once | ORAL | Status: AC
  Administered 2023-12-31: 12 mg via ORAL
  Filled 2023-12-31: qty 3

## 2023-12-31 MED ORDER — DIVALPROEX SODIUM 250 MG PO DR TAB
500.0000 mg | DELAYED_RELEASE_TABLET | Freq: Two times a day (BID) | ORAL | Status: DC
  Administered 2023-12-31 – 2024-01-01 (×3): 500 mg via ORAL
  Filled 2023-12-31: qty 1
  Filled 2023-12-31: qty 2
  Filled 2023-12-31 (×2): qty 1

## 2023-12-31 MED ORDER — DEXAMETHASONE 4 MG PO TABS
4.0000 mg | ORAL_TABLET | Freq: Four times a day (QID) | ORAL | Status: DC
  Administered 2023-12-31 – 2024-01-01 (×4): 4 mg via ORAL
  Filled 2023-12-31 (×4): qty 1

## 2023-12-31 MED ORDER — MAGIC MOUTHWASH: 5.0000 mL | Freq: Four times a day (QID) | ORAL | Status: DC | PRN

## 2023-12-31 MED ORDER — GADOBUTROL 1 MMOL/ML IV SOLN
7.0000 mL | Freq: Once | INTRAVENOUS | Status: AC | PRN
  Administered 2023-12-31: 7 mL via INTRAVENOUS

## 2023-12-31 NOTE — Evaluation (Signed)
 Occupational Therapy Evaluation Patient Details Name: Monica Soto MRN: [id number] DOB: Dec 01, 1965 Today's Date: 12/31/2023   History of Present Illness   Pt is a 58 y/o F admitted on 12/28/23 after presenting with c/o not feeling well, severe diarrhea, nausea; family also reports blank stare & eye fluttering lasting a few seconds, later another episode of eyes twitching & arms & legs shaking. Pt is being treated for new onset seizure. 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 Evidence of overlying diffuse skeletal metastatic disease. PMH: metastatic R breast CA to bones & liver, ER/PR positive HER2 negative, depression, IBS     Clinical Impressions Patient admitted for the diagnosis above.  Performed much better than PT session prior date.  No dizziness, no pain and no lethargy.  Patient able to complete toileting task and stand grooming with supervision.  Walked the hall without an AD, 300+ ft.  OT to continue efforts to ensure safe transition home, but no post acute OT is anticipated.       If plan is discharge home, recommend the following:   Assist for transportation;Assistance with cooking/housework     Functional Status Assessment   Patient has had a recent decline in their functional status and demonstrates the ability to make significant improvements in function in a reasonable and predictable amount of time.     Equipment Recommendations   None recommended by OT     Recommendations for Other Services         Precautions/Restrictions   Precautions Precautions: Fall Precaution/Restrictions Comments: seizures Restrictions Weight Bearing Restrictions Per Provider Order: No     Mobility Bed Mobility Overal bed mobility: Modified Independent                  Transfers Overall transfer level: Needs  assistance Equipment used: None Transfers: Sit to/from Stand Sit to Stand: Supervision                  Balance Overall balance assessment: Needs assistance Sitting-balance support: Feet supported Sitting balance-Leahy Scale: Good     Standing balance support: No upper extremity supported Standing balance-Leahy Scale: Fair                             ADL either performed or assessed with clinical judgement   ADL       Grooming: Wash/dry hands;Supervision/safety;Standing               Lower Body Dressing: Supervision/safety;Sit to/from stand   Toilet Transfer: Retail Banker;Ambulation                   Vision Patient Visual Report: No change from baseline       Perception Perception: Not tested       Praxis Praxis: Not tested       Pertinent Vitals/Pain Pain Assessment Pain Assessment: No/denies pain     Extremity/Trunk Assessment Upper Extremity Assessment Upper Extremity Assessment: Overall WFL for tasks assessed   Lower Extremity Assessment Lower Extremity Assessment: Defer to PT evaluation   Cervical / Trunk Assessment Cervical / Trunk Assessment: Normal   Communication Communication Communication: No apparent difficulties   Cognition Arousal: Alert Behavior During Therapy: WFL for tasks assessed/performed Cognition: No apparent impairments  Following commands: Intact       Cueing  General Comments   Cueing Techniques: Verbal cues      Exercises     Shoulder Instructions      Home Living Family/patient expects to be discharged to:: Private residence Living Arrangements: Spouse/significant other Available Help at Discharge: Family Type of Home: House Home Access: Stairs to enter Secretary/administrator of Steps: 4 Entrance Stairs-Rails: None Home Layout: One level     Bathroom Shower/Tub: Tub only   Firefighter: Standard Bathroom  Accessibility: Yes How Accessible: Accessible via walker Home Equipment: None          Prior Functioning/Environment               Mobility Comments: ambulatory without AD, driving ADLs Comments: independent (gets in/out of tub to take bath), dresses without assistance.    OT Problem List: Decreased strength;Impaired balance (sitting and/or standing)   OT Treatment/Interventions: Self-care/ADL training;Therapeutic activities;Patient/family education;Balance training      OT Goals(Current goals can be found in the care plan section)   Acute Rehab OT Goals Patient Stated Goal: Return home OT Goal Formulation: With patient Time For Goal Achievement: 01/14/24 Potential to Achieve Goals: Fair ADL Goals Pt Will Perform Grooming: Independently;standing Pt Will Perform Lower Body Dressing: Independently;sit to/from stand Pt Will Transfer to Toilet: Independently;ambulating;regular height toilet   OT Frequency:  Min 2X/week    Co-evaluation              AM-PAC OT 6 Clicks Daily Activity     Outcome Measure Help from another person eating meals?: None Help from another person taking care of personal grooming?: A Little Help from another person toileting, which includes using toliet, bedpan, or urinal?: A Little Help from another person bathing (including washing, rinsing, drying)?: A Little Help from another person to put on and taking off regular upper body clothing?: None Help from another person to put on and taking off regular lower body clothing?: A Little 6 Click Score: 20   End of Session Equipment Utilized During Treatment: Gait belt Nurse Communication: Mobility status  Activity Tolerance: Patient tolerated treatment well Patient left: in bed;with call bell/phone within reach;with family/visitor present  OT Visit Diagnosis: Unsteadiness on feet (R26.81)                Time: 8956-8897 OT Time Calculation (min): 19 min Charges:  OT General  Charges $OT Visit: 1 Visit OT Evaluation $OT Eval Moderate Complexity: 1 Mod  12/31/2023  RP, OTR/L  Acute Rehabilitation Services  Office:  [phone]   Monica Soto 12/31/2023, 12:03 PM

## 2023-12-31 NOTE — Progress Notes (Addendum)
 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  Physical Exam   Gen: patient lying in bed, NAD CV: extremities appear well-perfused Resp: normal WOB  Neurologic exam MS: alert, oriented x4, follows commands Speech: no dysarthria, no aphasia CN: PERRL, VFF, EOMI, sensation intact, face symmetric, hearing intact to voice Motor: 5/5 strength throughout Sensory: SILT Reflexes: 2+ symm with toes down bilat Coordination: FNF intact bilat Gait: deferred   Medications  Current Facility-Administered Medications:    0.45 % sodium chloride  infusion, , Intravenous, Continuous, Singh, Prashant K, MD, Last Rate: 100 mL/hr at 12/30/23 1225, New Bag at 12/30/23 1225   acetaminophen  (TYLENOL ) tablet 500 mg, 500 mg, Oral, Q6H PRN, Shona, Carole N, DO   cefTRIAXone  (ROCEPHIN ) 2 g in sodium chloride  0.9 % 100 mL IVPB, 2 g, Intravenous, Q24H, Hall, Carole N, DO, Last Rate: 200 mL/hr at 12/30/23 0513, 2 g at 12/30/23 9486   Chlorhexidine  Gluconate Cloth 2 % PADS 6 each, 6 each, Topical, Daily, Leotis Bogus, MD, 6 each at 12/30/23 1029   HYDROmorphone  (DILAUDID ) injection 1 mg, 1 mg, Intravenous, Q4H PRN, Leotis Bogus, MD, 1 mg at 12/30/23 2024   lidocaine -prilocaine  (EMLA ) cream, , Topical, Daily PRN, Singh, Prashant K, MD   melatonin tablet 5 mg, 5 mg, Oral, QHS PRN, Shona, Carole N, DO   nicotine  (NICODERM CQ  - dosed in mg/24  hours) patch 14 mg, 14 mg, Transdermal, Daily, Hall, Carole N, DO, 14 mg at 12/30/23 1028   pantoprazole  (PROTONIX ) EC tablet 40 mg, 40 mg, Oral, Daily PRN, Singh, Prashant K, MD   polyethylene glycol (MIRALAX  / GLYCOLAX ) packet 17 g, 17 g, Oral, Daily PRN, Hall, Carole N, DO   prochlorperazine  (COMPAZINE ) injection 5 mg, 5 mg, Intravenous, Q6H PRN, Shona Laurence N, DO, 5 mg at 12/29/23 0058   prochlorperazine  (COMPAZINE ) tablet 10 mg, 10 mg, Oral, Q6H PRN, Singh, Prashant K, MD   sodium chloride  flush (NS) 0.9 % injection 10-40 mL, 10-40 mL, Intracatheter, Q12H, Leotis, Pardeep, MD, 10 mL at 12/30/23 1029   valproate (DEPACON ) 250 mg in dextrose  5 % 50 mL IVPB, 250 mg, Intravenous, Q6H, Matthews Elida HERO, MD, Last Rate: 52.5 mL/hr at 12/30/23 2345, 250 mg at 12/30/23 2345   vancomycin  (VANCOREADY) IVPB 750 mg/150 mL, 750 mg, Intravenous, Q12H, Khatri, Pardeep, MD, Last Rate: 150 mL/hr at 12/30/23 2042, 750 mg at 12/30/23 2042  Facility-Administered Medications Ordered in Other Encounters:    0.9 %  sodium chloride  infusion, , Intravenous, Continuous, [name], MD, Stopped at 09/24/23 1259  Labs and Diagnostic Imaging   CBC:  Recent Labs  Lab 12/29/23 0703 12/30/23 0556  WBC 15.3* 9.9  NEUTROABS 13.8* 8.0*  HGB 11.5* 10.9*  HCT 35.7* 35.2*  MCV 96.5 98.6  PLT  244 244    Basic Metabolic Panel:  Lab Results  Component Value Date   NA 146 (H) 12/30/2023   K 3.5 12/30/2023   CO2 24 12/30/2023   GLUCOSE 86 12/30/2023   BUN 10 12/30/2023   CREATININE 0.91 12/30/2023   CALCIUM 8.0 (L) 12/30/2023   GFRNONAA >60 12/30/2023   GFRAA >60 03/29/2015   Lipid Panel: No results found for: LDLCALC HgbA1c: No results found for: HGBA1C Urine Drug Screen:     Component Value Date/Time   LABOPIA NEGATIVE 12/28/2023 2259   COCAINSCRNUR NEGATIVE 12/28/2023 2259   LABBENZ NEGATIVE 12/28/2023 2259   AMPHETMU NEGATIVE 12/28/2023 2259   THCU NEGATIVE 12/28/2023 2259   LABBARB  NEGATIVE 12/28/2023 2259    Alcohol Level     Component Value Date/Time   ETH <15 12/28/2023 2023   INR  Lab Results  Component Value Date   INR 1.0 12/07/2022   APTT No results found for: APTT AED levels: No results found for: PHENYTOIN, ZONISAMIDE, LAMOTRIGINE, LEVETIRACETA  LTM EEG: intermittent generalized slowing, no seizures  MRI Brain(Personally reviewed): 1. 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 2. Evidence of overlying diffuse skeletal metastatic disease. 3. Minimal associated cerebral edema at this time. No intracranial mass effect.  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 I shared this news with patient and her husband at bedside and reviewed the MRI images with them. They have a lot of questions for oncology about what this will mean for her treatment plan and I assured her that oncology would be consulted tomorrow. After oncology weighs in we will follow up to make sure that the AED regimen we have her on is compatible with any chemo or other medication they might recommend. She had severe agitation on keppra  and I have added this to her allergy list. She is currently on depakote  but there are other options we could consider switching her to if needed.  Recommendations   - Please consult oncology in the morning - We will follow up after oncology weighs in to make sure depakote  is an appropriate medication to discharge her on - D/c LTM in AM if no seizures overnight - I will arrange outpatient f/u with neurology ______________________________________________________________________   Signed, Elida CHRISTELLA Ross, MD [facility]

## 2023-12-31 NOTE — Progress Notes (Signed)
 Care link transport schedule for 01/01/24 at 0730. Destination [facility].

## 2023-12-31 NOTE — Procedures (Addendum)
 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 EEG to evaluate for seizure   Level of alertness: Awake, asleep   AEDs during EEG study: VPA   Technical aspects: This EEG study was done with scalp electrodes positioned according to the 10-20 International system of electrode placement. Electrical activity was reviewed with band pass filter of 1-70Hz , sensitivity of 7 uV/mm, display speed of 19mm/sec with a 60Hz  notched filter applied as appropriate. EEG data were recorded continuously and digitally stored.  Video monitoring was available and reviewed as appropriate.   Description: The posterior dominant rhythm consists of 9-10 Hz activity of moderate voltage (25-35 uV) seen predominantly in posterior head regions, symmetric and reactive to eye opening and eye closing. Sleep was characterized by vertex waves, sleep spindles (12 to 14 Hz), maximal frontocentral region.  There is intermittent generalized 3 to 6 Hz theta-delta slowing. Hyperventilation and photic stimulation were not performed.       ABNORMALITY - Intermittent slow, generalized   IMPRESSION: This study is suggestive of mild generalized cerebral dysfunction (encephalopathy). No seizures or epileptiform discharges were seen throughout the recording.   Cambrie Sonnenfeld O Karlyn Glasco

## 2023-12-31 NOTE — Progress Notes (Signed)
 LTM EEG discontinued -  skin breakdown at [facility].RN aware. FZ,O1,P3. Mild irritation

## 2023-12-31 NOTE — Plan of Care (Signed)

## 2023-12-31 NOTE — Progress Notes (Incomplete)
  Radiation Oncology         [phone] ________________________________  Name: Monica Soto MRN: [id number]  Date: 01/01/2024  DOB: 25-Sep-1965  SIMULATION AND TREATMENT PLANNING NOTE    ICD-10-CM   1. Cancer of right breast metastatic to brain [facility])  C50.911    C79.31       DIAGNOSIS:    58 y.o. 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  Identity was confirmed.  All relevant records and images related to the planned course of therapy were reviewed.  The patient freely provided informed written consent to proceed with treatment after reviewing the details related to the planned course of therapy. The consent form was witnessed and verified by the simulation staff.  Then, the patient was set-up in a stable reproducible  supine position for radiation therapy.  CT images were obtained.  Surface markings were placed.  The CT images were loaded into the planning software.  Then the target and avoidance structures were contoured.  Treatment planning then occurred.  The radiation prescription was entered and confirmed.  Then, I designed and supervised the construction of a total of 3 medically necessary complex treatment device including a custom made thermoplastic mask used for immobilization, and two MLC collimator apertures for radiotherapy from the right and left side, with independent collimation for each to account for beam divergence.  I have requested : Isodose Plan.    PLAN:  The whole brain will be treated to 30 Gy in 10 fractions.  ________________________________  Donnice FELIX Patrcia, M.D.

## 2023-12-31 NOTE — Plan of Care (Signed)
 Plan of Care:  LTM neg overnight for seizures.  Awaiting Oncology consult, still pending.   Plan: D/C LTM Discuss with Oncology if ok with current AED. Swith to PO depakote  DR 750mg  BID  Oncology in agreement with Depakote .   Neurology will sign off.  F/u outpt neuro

## 2023-12-31 NOTE — Progress Notes (Signed)
 PROGRESS NOTE     Patient Demographics:    Monica Soto, is a 58 y.o. 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  Family reports she has diarrhea for last 4 days associated with nausea without vomiting.    She also had confusion and fatigue.  Family decided to bring her in the ED.  While they were getting her into the car they noticed that her eyes were fluttering and she had blank stare, lasting few seconds.  She has another episode of eyes twitching, her arms and legs began to shake lasting for 30 seconds witnessed by nursing staff.  Patient has received 6 mg of IV Ativan  in the ED.  EDP discussed the case with neurology who recommended Keppra  load 2.5 mg x 1.  Neurologist recommended admission in the [facility] for seizure workup and management.    Subjective:   Patient in bed, appears comfortable, denies any headache, no fever, no chest pain or pressure, no shortness of breath , no abdominal pain. No focal weakness.   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  Due to metastatic breast cancer with newfound mets to the brain.  Seen by neurology, CT head unremarkable, MRI was obtained for brain metastasis, EEG stable, continue to monitor on AEDs, speech eval, PT eval. neurology following, oncology also requested to see  the patient, continue supportive care.   Acute metabolic encephalopathy in the setting of new onset seizure versus syncope: Treat underlying condition.  UA negative for infection. Urine drug screen negative. Continue AEDs, hydrate with IV fluids and monitor GI symptoms have resolved   Right breast wound due to her underlying malignancy.  Present on admission and chronic. Wound care consulted. New local wound care for now continue antibiotics, minimal surrounding redness which is close to her baseline, patient has seen general surgery in the past and was requested to continue local wound care as she is doing.  No fever no leukocytosis.  Continue to monitor.  Postdischarge follow-up with her surgeon.   Right breast cancer with metastasis to bones liver and now brain. Continue Trodelvy . Medical oncology consulted.   Diarrhea, dehydration, hyponatremia, hypotension likely due to suspect chemotherapy-induced diarrhea: Reportedly the patient was having diarrhea days prior to admission. Continue IV fluid hydration. Monitor electrolytes and replete as indicated.  Supportive care for diarrhea.   Hypokalemia Replaced.  Continue to monitor   Elevated alkaline phosphatase in the setting of bone metastasis: Alkaline phosphatase 258 Monitor for now   Tobacco use: Nicotine  patch   Hematuria mild hydronephrosis: UA shows positive hemoglobin. Avoid anticoagulation or antiplatelets.  Creatinine is stable.  Will continue to monitor.      Condition - Extremely Guarded  Family Communication  : Daughter and husband bedside on 12/30/2023 12/31/2023  Code Status : Full code  Consults  : Neurology,  oncology  PUD Prophylaxis :    Procedures  :     MRI brain.  1. 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 2. Evidence of overlying diffuse skeletal metastatic disease. 3. Minimal associated cerebral edema at this time. No intracranial mass effect.  Overnight EEG on 12/31/2023.    No acute seizures.    CT brain.  Nonacute.    CT abdomen pelvis.   1. Liver metastases on a background of cirrhosis, overall grossly similar to prior, without definite interval progression identified on this noncontrast exam. The spleen is enlarged. 2. Mild left hydronephrosis and hydroureter with perinephric stranding and edema, which may reflect extrinsic compression or reflux; no ureteral calculi identified. 3. Diffuse osseous metastatic disease with heterogeneous sclerosis throughout the visualized skeleton.      Disposition Plan  :    Status is: Inpatient   DVT Prophylaxis  :    Place and maintain sequential compression device Start: 12/29/23 1106 Place and maintain sequential compression device Start: 12/29/23 0653   Lab Results  Component Value Date   PLT 251 12/31/2023    Diet :  Diet Order             Diet regular Room service appropriate? Yes; Fluid consistency: Thin  Diet effective now                    Inpatient Medications  Scheduled Meds:  Chlorhexidine  Gluconate Cloth  6 each Topical Daily   divalproex   500 mg Oral Q12H   nicotine   14 mg Transdermal Daily   potassium chloride   20 mEq Oral Once   sodium chloride  flush  10-40 mL Intracatheter Q12H   Continuous Infusions:  cefTRIAXone  (ROCEPHIN )  IV 2 g (12/31/23 0505)   potassium chloride  10 mEq (12/31/23 0849)   valproate sodium  250 mg (12/31/23 0615)   vancomycin  750 mg (12/30/23 2042)   PRN Meds:.acetaminophen , HYDROmorphone  (  DILAUDID ) injection, lidocaine -prilocaine , melatonin,  pantoprazole , polyethylene glycol, prochlorperazine , prochlorperazine        Objective:   Vitals:   12/30/23 2341 12/31/23 0402 12/31/23 0800 12/31/23 0808  BP: 114/61 116/65 105/89 105/89  Pulse: 94 94 88   Resp: 15   (!) 21  Temp: 98.8 F (37.1 C) 98.5 F (36.9 C) 98.7 F (37.1 C)   TempSrc: Oral Oral Oral   SpO2: 96%  99%   Weight:      Height:        Wt Readings from Last 3 Encounters:  12/29/23 70.8 kg  12/17/23 70.8 kg  12/03/23 69.7 kg     Intake/Output Summary (Last 24 hours) at 12/31/2023 9077 Last data filed at 12/31/2023 0014 Gross per 24 hour  Intake --  Output 700 ml  Net -700 ml     Physical Exam  Awake Alert, No new F.N deficits, Normal affect [place].AT,PERRAL Supple Neck, No JVD,   Symmetrical Chest wall movement, Good air movement bilaterally, CTAB RRR,No Gallops,Rubs or new Murmurs,  +ve B.Sounds, Abd Soft, No tenderness,   No Cyanosis, Clubbing or edema, patient has open chronic right breast wound with minimal surrounding redness, this was present on admission,       Data Review:    Recent Labs  Lab 12/28/23 2023 12/28/23 2057 12/29/23 0703 12/30/23 0556 12/31/23 0247  WBC 11.6*  --  15.3* 9.9 11.1*  HGB 11.1* 11.6* 11.5* 10.9* 10.5*  HCT 35.3* 34.0* 35.7* 35.2* 32.8*  PLT 231  --  244 244 251  MCV 99.4  --  96.5 98.6 97.9  MCH 31.3  --  31.1 30.5 31.3  MCHC 31.4  --  32.2 31.0 32.0  RDW 16.5*  --  16.8* 17.5* 17.4*  LYMPHSABS 2.1  --  0.6* 1.2 1.3  MONOABS 0.7  --  0.8 0.6 0.9  EOSABS 0.2  --  0.2 0.1 0.2  BASOSABS 0.0  --  0.0 0.0 0.1    Recent Labs  Lab 12/28/23 2023 12/28/23 2057 12/30/23 0556 12/31/23 0247  NA 140 141 146* 144  K 3.2* 3.2* 3.5 2.9*  CL 103 104 109 107  CO2 20*  --  24 24  ANIONGAP 18*  --  13 13  GLUCOSE 148* 149* 86 144*  BUN 8 7 10 7   CREATININE 0.56 0.60 0.91 0.78  AST 32  --  19 15  ALT 21  --  17 16  ALKPHOS 258*  --  165* 150*  BILITOT <0.2  --  0.7 0.7  ALBUMIN 3.9  --  2.7* 2.7*   CRP  --   --  14.7*  --   PROCALCITON  --   --  0.52  --   MG 1.9  --  2.0 2.0  PHOS  --   --  5.1*  --   CALCIUM 9.2  --  8.0* 8.1*      Recent Labs  Lab 12/28/23 2023 12/30/23 0556 12/31/23 0247  CRP  --  14.7*  --   PROCALCITON  --  0.52  --   MG 1.9 2.0 2.0  CALCIUM 9.2 8.0* 8.1*    --------------------------------------------------------------------------------------------------------------- No results found for: CHOL, HDL, LDLCALC, LDLDIRECT, TRIG, CHOLHDL  No results found for: HGBA1C No results for input(s): TSH, T4TOTAL, FREET4, T3FREE, THYROIDAB in the last 72 hours. No results for input(s): VITAMINB12, FOLATE, FERRITIN, TIBC, IRON , RETICCTPCT in the last 72 hours. ------------------------------------------------------------------------------------------------------------------ Cardiac Enzymes No results for input(s): CKMB, TROPONINI, MYOGLOBIN in  the last 168 hours.  Invalid input(s): CK  Micro Results No results found for this or any previous visit (from the past 240 hours).  Radiology Report MR Brain W and Wo Contrast Result Date: 12/30/2023 EXAM: MRI BRAIN WITH AND WITHOUT CONTRAST 12/30/2023 10:09:00 AM TECHNIQUE: Multiplanar multisequence MRI of the head/brain was performed with and without the administration of intravenous contrast. 8 mL gadobutrol  (GADAVIST ) 1 MMOL/ML IV solution was administered. COMPARISON: Head CT without contrast 12/28/2023. CLINICAL HISTORY: 58 year old female. Seizure, new-onset, no history of trauma; new seizure, history of metastatic breast cancer, query brain metastases. FINDINGS: BRAIN AND VENTRICLES: No acute infarct. No acute intracranial hemorrhage. No midline shift or significant intracranial mass effect. No hydrocephalus. The sella is unremarkable. Normal flow voids. Following contrast, the major dural venous sinuses are enhancing and appear to be patent. abnormal enhancement in the  posterior left hemisphere affecting the parietal and superior occipital lobes is in part contiguous with the overlying dura (series 12 image 84) but also has a multinodular and gyriform pattern (series 12 image 74 and series 13 image 9) indicating leptomeningeal tumor involvement. Additional subcentimeter parenchymal versus leptomeningeal biparietal metastases are seen on series 12 image 89 and series 13 image 9. There is also abnormal cerebellar leptomeningeal enhancement in the left hemisphere on series 12 image 21. Loss of the normal FLAIR signal suppression in the sulci here, but minimal associated cerebral edema at this time, primarily in the occipital lobe (series 6 images 15 and 12). SWI reveals mild hemosiderin in association with some of the bulky leptomeningeal disease (series 7 image 50) in the left occipital lobe. No ependymal enhancement. No abnormality diffusion. Thin slice coronal images are motion degraded, the hippocampal formations and mesial temporal lobes appear grossly symmetric and negative. No other acute or chronic cerebral blood products identified. Deep gray nuclei and brainstem appear negative. ORBITS: No acute abnormality. No orbital metastases identified. SINUSES: No acute abnormality. Paranasal sinuses and mastoids are well aerated. BONES AND SOFT TISSUES: Bone marrow signal is diffusely abnormal, and suggests diffuse skull and visible cervical spine skeletal metastatic disease. No acute soft tissue abnormality. No scalp metastases identified. IMPRESSION: 1. 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 2. Evidence of overlying diffuse skeletal metastatic disease. 3. Minimal associated cerebral edema at this time. No intracranial mass effect. Electronically signed by: Helayne Hurst MD 12/30/2023 10:40 AM EST RP Workstation: [id number]   Overnight EEG with  video Result Date: 12/30/2023 Shelton Arlin KIDD, MD     12/31/2023  6:08 AM Patient Name: DENAJAH FARIAS MRN: [id number] Epilepsy Attending: Arlin KIDD Shelton Referring Physician/Provider: Matthews Elida HERO, MD Duration: 12/29/2023 1407 to 12/30/2023 1530 Patient history: 58 yo woman with hx breast cancer metastatic to bone and liver transferred from [facility] after first-time seizure. EEG to evaluate for seizure Level of alertness: Awake, asleep AEDs during EEG study: VPA Technical aspects: This EEG study was done with scalp electrodes positioned according to the 10-20 International system of electrode placement. Electrical activity was reviewed with band pass filter of 1-70Hz , sensitivity of 7 uV/mm, display speed of 66mm/sec with a 60Hz  notched filter applied as appropriate. EEG data were recorded continuously and digitally stored.  Video monitoring was available and reviewed as appropriate. Description: The posterior dominant rhythm consists of 9-10 Hz activity of moderate voltage (25-35 uV) seen predominantly in posterior head regions, symmetric and reactive to eye opening and eye closing.  Sleep was characterized by vertex waves, sleep spindles (12 to 14 Hz), maximal frontocentral region.  There is intermittent generalized 3 to 6 Hz theta-delta slowing. Hyperventilation and photic stimulation were not performed.   EEG was disconnected between 12/30/2023 0908 to 1029 for imaging. ABNORMALITY - Intermittent slow, generalized IMPRESSION: This study is suggestive of mild generalized cerebral dysfunction (encephalopathy). No seizures or epileptiform discharges were seen throughout the recording. Arlin MALVA Krebs   EEG adult Result Date: 12/29/2023 Gregg Lek, MD     12/29/2023  5:22 PM Patient Name: CATHARINA PICA MRN: [id number] Epilepsy Attending: Lek Gregg Referring Physician/Provider: No ref. provider found     Date: 12/29/2023 Duration: 22 minutes Patient history: 58 year old woman with metastatic  brain cancer who presents with new seizure. Level of alertness: Awake, drowsy AEDs during EEG study: Depakote  Technical aspects: This EEG study was done with scalp electrodes positioned according to the 10-20 International system of electrode placement. Electrical activity was reviewed with band pass filter of 1-70Hz , sensitivity of 7 uV/mm, display speed of 53mm/sec with a 60Hz  notched filter applied as appropriate. EEG data were recorded continuously and digitally stored.  Video monitoring was available and reviewed as appropriate. Description: EEG showed continuous generalized polymorphic sharply contoured 3 to 6 Hz theta-delta slowing. Hyperventilation and photic stimulation were not performed.   ABNORMALITY - Continuous slow, generalized IMPRESSION: This study is suggestive of mild to moderate diffuse encephalopathy, nonspecific etiology but likely related to sedation, toxic-metabolic etiology. No seizures or epileptiform discharges were seen throughout the recording. Lek Gregg MD Neurology    CT ABDOMEN PELVIS WO CONTRAST Result Date: 12/29/2023 EXAM: CT ABDOMEN AND PELVIS WITHOUT CONTRAST 12/29/2023 12:50:58 PM TECHNIQUE: CT of the abdomen and pelvis was performed without the administration of intravenous contrast. Multiplanar reformatted images are provided for review. Automated exposure control, iterative reconstruction, and/or weight-based adjustment of the mA/kV was utilized to reduce the radiation dose to as low as reasonably achievable. COMPARISON: PET CT 11/21/2023. CLINICAL HISTORY: Abdominal pain, acute, nonlocalized. FINDINGS: LOWER CHEST: Atelectasis and scarring in the lung bases. Cardiac enlargement. LIVER: Cirrhotic changes in the liver with diffusely nodular contour. Poorly defined nodular hypodense lesions throughout the liver consistent with known metastatic disease. These are poorly demonstrated on noncontrast imaging but appear grossly similar to the previous study. GALLBLADDER AND  BILE DUCTS: Gallbladder and bile ducts are normal. SPLEEN: The spleen is enlarged without focal lesion. PANCREAS: The pancreas is normal. ADRENAL GLANDS: The adrenal glands are normal. KIDNEYS, URETERS AND BLADDER: Mild hydronephrosis and hydroureter on the left with stranding and edema around the left kidney. No ureteral stones are demonstrated. This could be due to recently passed stone, extrinsic compression or reflux. The bladder is diffusely distended without wall thickening or intraluminal filling defects. No bladder stones. GI AND BOWEL: Stomach, small bowel, and colon are not abnormally distended. No wall thickening or inflammatory stranding are appreciated. The appendix is surgically absent. PERITONEUM AND RETROPERITONEUM: No ascites. No free air. VASCULATURE: Aorta is normal in caliber. LYMPH NODES: No lymphadenopathy. REPRODUCTIVE ORGANS: The uterus is surgically absent. No abnormal adnexal masses. BONES AND SOFT TISSUES: Diffuse heterogeneous sclerosis throughout the visualized skeleton consistent with diffuse bone metastasis. No focal soft tissue abnormality. IMPRESSION: 1. Liver metastases on a background of cirrhosis, overall grossly similar to prior, without definite interval progression identified on this noncontrast exam. The spleen is enlarged. 2. Mild left hydronephrosis and hydroureter with perinephric stranding and edema, which may reflect extrinsic compression or reflux; no ureteral  calculi identified. 3. Diffuse osseous metastatic disease with heterogeneous sclerosis throughout the visualized skeleton. Electronically signed by: Elsie Gravely MD 12/29/2023 01:05 PM EST RP Workstation: [id number]     Signature  -   Lavada Stank M.D on 12/31/2023 at 9:22 AM   -  To page go to [email or web address]

## 2023-12-31 NOTE — Progress Notes (Addendum)
 [name]   DOB:Jun 02, 1965   FM#:[id number]      ASSESSMENT & PLAN:  Monica Soto is a 58 year old female patient who came to the hospital on 12/28/2023 with complaints of seizure-like activity.   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  Patient is followed by Dr. Christine oncologist.  Patient will be covered during this hospitalization by Dr. Lanny.  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  Later changed to carboplatin  and Abraxane  due to progression of disease 12/2022.  Added pembrolizumab  to regimen.  PET scan done 05/2023 showed progression of disease and patient was started on Sacituzumab govitecan  on 06/10/2023 - Patient also seen at [facility] for second opinion and possible clinical trials. --MRI brain done 12/30/2023 showed metastatic disease. - Radiation oncology evaluation done today.  Plans for WBRT to be started. - Pending MR spine, will follow results. - Follows with Dr. Davonna.  Medical oncology/Dr. Lanny will cover for this hospitalization.  Seizure-like activity - Patient's daughter reports that on Saturday was noted that patient had altered mentation, forgetting words and not comprehending or verbally responding well.  Decision made to bring her to ED for further evaluation.  States that when they arrived in the lobby of the hospital she became screaming and her eyes fluttered. - No further episodes since that single episode. - Initial CT head done 11/22 showed no acute intracranial abnormality.  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  Evidence of overlying diffuse skeletal mets. - Continue steroids as ordered  Anemia - Mild - Hemoglobin 10.5 - Continue to monitor CBC with differential  Right breast wound - Secondary to  malignancy - Continue wound care and supportive care   Code Status Full   Subjective:  Patient seen awake and alert sitting up in bed.  Her daughter is at bedside.  Relate story of events leading up to seizure on 12/28/2023.  Patient reports that she currently feels well and does not remember those events.  Waiting for MRI of spine to be done later today.  Aware of radiation oncology eval with planned WBRT.  No other acute complaints offered.  Objective:   Intake/Output Summary (Last 24 hours) at 12/31/2023 1407 Last data filed at 12/31/2023 0014 Gross per 24 hour  Intake --  Output 700 ml  Net -700 ml     PHYSICAL EXAMINATION: ECOG PERFORMANCE STATUS: 3 - Symptomatic, >50% confined to bed  Vitals:   12/31/23 0800 12/31/23 1201  BP: 105/89 112/61  Pulse: 88 84  Resp:    Temp: 98.7 F (37.1 C) 97.8 F (36.6 C)  SpO2: 99% 97%   Filed Weights   12/28/23 2005 12/29/23 1437 12/29/23 1438  Weight: 156 lb 1.4 oz (70.8 kg) 156 lb 1.4 oz (70.8 kg) 156 lb 1.4 oz (70.8 kg)    GENERAL: alert, no distress and comfortable SKIN: +pale skin color, texture, turgor are normal, no rashes or significant lesions EYES: normal, conjunctiva are pink and non-injected, sclera clear OROPHARYNX: no exudate, no erythema and lips, buccal mucosa, and tongue normal  NECK: supple, thyroid normal size, non-tender, without nodularity LYMPH: no palpable lymphadenopathy in the cervical, axillary or inguinal LUNGS: clear to auscultation and percussion with normal breathing effort HEART: regular rate & rhythm and no murmurs and no lower extremity edema ABDOMEN: abdomen  soft, non-tender and normal bowel sounds MUSCULOSKELETAL: no cyanosis of digits and no clubbing  PSYCH: alert & oriented x 3 with fluent speech NEURO: no focal motor/sensory deficits   All questions were answered. The patient knows to call the clinic with any problems, questions or concerns.   The total time spent in the appointment  was 40 minutes encounter with patient including review of chart and various tests results, discussions about plan of care and coordination of care plan  Olam JINNY Brunner, NP 12/31/2023 2:07 PM    Labs Reviewed:  Lab Results  Component Value Date   WBC 11.1 (H) 12/31/2023   HGB 10.5 (L) 12/31/2023   HCT 32.8 (L) 12/31/2023   MCV 97.9 12/31/2023   PLT 251 12/31/2023   Recent Labs    12/28/23 2023 12/28/23 2057 12/30/23 0556 12/31/23 0247  NA 140 141 146* 144  K 3.2* 3.2* 3.5 2.9*  CL 103 104 109 107  CO2 20*  --  24 24  GLUCOSE 148* 149* 86 144*  BUN 8 7 10 7   CREATININE 0.56 0.60 0.91 0.78  CALCIUM 9.2  --  8.0* 8.1*  GFRNONAA >60  --  >60 >60  PROT 6.3*  --  5.7* 5.5*  ALBUMIN 3.9  --  2.7* 2.7*  AST 32  --  19 15  ALT 21  --  17 16  ALKPHOS 258*  --  165* 150*  BILITOT <0.2  --  0.7 0.7    Studies Reviewed:  MR Brain W and Wo Contrast Result Date: 12/30/2023 EXAM: MRI BRAIN WITH AND WITHOUT CONTRAST 12/30/2023 10:09:00 AM TECHNIQUE: Multiplanar multisequence MRI of the head/brain was performed with and without the administration of intravenous contrast. 8 mL gadobutrol  (GADAVIST ) 1 MMOL/ML IV solution was administered. COMPARISON: Head CT without contrast 12/28/2023. CLINICAL HISTORY: 58 year old female. Seizure, new-onset, no history of trauma; new seizure, history of metastatic breast cancer, query brain metastases. FINDINGS: BRAIN AND VENTRICLES: No acute infarct. No acute intracranial hemorrhage. No midline shift or significant intracranial mass effect. No hydrocephalus. The sella is unremarkable. Normal flow voids. Following contrast, the major dural venous sinuses are enhancing and appear to be patent. abnormal enhancement in the posterior left hemisphere affecting the parietal and superior occipital lobes is in part contiguous with the overlying dura (series 12 image 84) but also has a multinodular and gyriform pattern (series 12 image 74 and series 13 image 9)  indicating leptomeningeal tumor involvement. Additional subcentimeter parenchymal versus leptomeningeal biparietal metastases are seen on series 12 image 89 and series 13 image 9. There is also abnormal cerebellar leptomeningeal enhancement in the left hemisphere on series 12 image 21. Loss of the normal FLAIR signal suppression in the sulci here, but minimal associated cerebral edema at this time, primarily in the occipital lobe (series 6 images 15 and 12). SWI reveals mild hemosiderin in association with some of the bulky leptomeningeal disease (series 7 image 50) in the left occipital lobe. No ependymal enhancement. No abnormality diffusion. Thin slice coronal images are motion degraded, the hippocampal formations and mesial temporal lobes appear grossly symmetric and negative. No other acute or chronic cerebral blood products identified. Deep gray nuclei and brainstem appear negative. ORBITS: No acute abnormality. No orbital metastases identified. SINUSES: No acute abnormality. Paranasal sinuses and mastoids are well aerated. BONES AND SOFT TISSUES: Bone marrow signal is diffusely abnormal, and suggests diffuse skull and visible cervical spine skeletal metastatic disease. No acute soft tissue abnormality. No scalp metastases identified. IMPRESSION: 1.  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 2. Evidence of overlying diffuse skeletal metastatic disease. 3. Minimal associated cerebral edema at this time. No intracranial mass effect. Electronically signed by: Helayne Hurst MD 12/30/2023 10:40 AM EST RP Workstation: [id number]   Overnight EEG with video Result Date: 12/30/2023 Shelton Arlin KIDD, MD     12/31/2023  6:08 AM Patient Name: Monica Soto MRN: [id number] Epilepsy Attending: Arlin KIDD Shelton Referring Physician/Provider: Matthews Elida HERO, MD Duration: 12/29/2023 1407 to  12/30/2023 1530 Patient history: 58 yo woman with hx breast cancer metastatic to bone and liver transferred from [facility] after first-time seizure. EEG to evaluate for seizure Level of alertness: Awake, asleep AEDs during EEG study: VPA Technical aspects: This EEG study was done with scalp electrodes positioned according to the 10-20 International system of electrode placement. Electrical activity was reviewed with band pass filter of 1-70Hz , sensitivity of 7 uV/mm, display speed of 61mm/sec with a 60Hz  notched filter applied as appropriate. EEG data were recorded continuously and digitally stored.  Video monitoring was available and reviewed as appropriate. Description: The posterior dominant rhythm consists of 9-10 Hz activity of moderate voltage (25-35 uV) seen predominantly in posterior head regions, symmetric and reactive to eye opening and eye closing. Sleep was characterized by vertex waves, sleep spindles (12 to 14 Hz), maximal frontocentral region.  There is intermittent generalized 3 to 6 Hz theta-delta slowing. Hyperventilation and photic stimulation were not performed.   EEG was disconnected between 12/30/2023 0908 to 1029 for imaging. ABNORMALITY - Intermittent slow, generalized IMPRESSION: This study is suggestive of mild generalized cerebral dysfunction (encephalopathy). No seizures or epileptiform discharges were seen throughout the recording. Arlin KIDD Shelton   EEG adult Result Date: 12/29/2023 Gregg Lek, MD     12/29/2023  5:22 PM Patient Name: VERL WHITMORE MRN: [id number] Epilepsy Attending: Lek Gregg Referring Physician/Provider: No ref. provider found     Date: 12/29/2023 Duration: 22 minutes Patient history: 58 year old woman with metastatic brain cancer who presents with new seizure. Level of alertness: Awake, drowsy AEDs during EEG study: Depakote  Technical aspects: This EEG study was done with scalp electrodes positioned according to the 10-20 International system of electrode  placement. Electrical activity was reviewed with band pass filter of 1-70Hz , sensitivity of 7 uV/mm, display speed of 11mm/sec with a 60Hz  notched filter applied as appropriate. EEG data were recorded continuously and digitally stored.  Video monitoring was available and reviewed as appropriate. Description: EEG showed continuous generalized polymorphic sharply contoured 3 to 6 Hz theta-delta slowing. Hyperventilation and photic stimulation were not performed.   ABNORMALITY - Continuous slow, generalized IMPRESSION: This study is suggestive of mild to moderate diffuse encephalopathy, nonspecific etiology but likely related to sedation, toxic-metabolic etiology. No seizures or epileptiform discharges were seen throughout the recording. Lek Gregg MD Neurology    CT ABDOMEN PELVIS WO CONTRAST Result Date: 12/29/2023 EXAM: CT ABDOMEN AND PELVIS WITHOUT CONTRAST 12/29/2023 12:50:58 PM TECHNIQUE: CT of the abdomen and pelvis was performed without the administration of intravenous contrast. Multiplanar reformatted images are provided for review. Automated exposure control, iterative reconstruction, and/or weight-based adjustment of the mA/kV was utilized to reduce the radiation dose to as low as reasonably achievable. COMPARISON: PET CT 11/21/2023. CLINICAL HISTORY: Abdominal pain, acute, nonlocalized. FINDINGS: LOWER CHEST: Atelectasis and scarring in the lung bases. Cardiac enlargement. LIVER: Cirrhotic changes in the liver with diffusely nodular contour. Poorly  defined nodular hypodense lesions throughout the liver consistent with known metastatic disease. These are poorly demonstrated on noncontrast imaging but appear grossly similar to the previous study. GALLBLADDER AND BILE DUCTS: Gallbladder and bile ducts are normal. SPLEEN: The spleen is enlarged without focal lesion. PANCREAS: The pancreas is normal. ADRENAL GLANDS: The adrenal glands are normal. KIDNEYS, URETERS AND BLADDER: Mild hydronephrosis and  hydroureter on the left with stranding and edema around the left kidney. No ureteral stones are demonstrated. This could be due to recently passed stone, extrinsic compression or reflux. The bladder is diffusely distended without wall thickening or intraluminal filling defects. No bladder stones. GI AND BOWEL: Stomach, small bowel, and colon are not abnormally distended. No wall thickening or inflammatory stranding are appreciated. The appendix is surgically absent. PERITONEUM AND RETROPERITONEUM: No ascites. No free air. VASCULATURE: Aorta is normal in caliber. LYMPH NODES: No lymphadenopathy. REPRODUCTIVE ORGANS: The uterus is surgically absent. No abnormal adnexal masses. BONES AND SOFT TISSUES: Diffuse heterogeneous sclerosis throughout the visualized skeleton consistent with diffuse bone metastasis. No focal soft tissue abnormality. IMPRESSION: 1. Liver metastases on a background of cirrhosis, overall grossly similar to prior, without definite interval progression identified on this noncontrast exam. The spleen is enlarged. 2. Mild left hydronephrosis and hydroureter with perinephric stranding and edema, which may reflect extrinsic compression or reflux; no ureteral calculi identified. 3. Diffuse osseous metastatic disease with heterogeneous sclerosis throughout the visualized skeleton. Electronically signed by: Elsie Gravely MD 12/29/2023 01:05 PM EST RP Workstation: [id number]   CT Head Wo Contrast Result Date: 12/28/2023 EXAM: CT HEAD WITHOUT CONTRAST 12/28/2023 08:43:23 PM TECHNIQUE: CT of the head was performed without the administration of intravenous contrast. Automated exposure control, iterative reconstruction, and/or weight based adjustment of the mA/kV was utilized to reduce the radiation dose to as low as reasonably achievable. COMPARISON: 05/03/2011 CLINICAL HISTORY: Seizure, new-onset, no history of trauma. FINDINGS: BRAIN AND VENTRICLES: No acute hemorrhage. No evidence of acute infarct. No  extra-axial collection. No mass effect or midline shift. ORBITS: No acute abnormality. SINUSES: No acute abnormality. SOFT TISSUES AND SKULL: No acute soft tissue abnormality. No skull fracture. IMPRESSION: 1. No acute intracranial abnormality. Electronically signed by: Oneil Devonshire MD 12/28/2023 08:51 PM EST RP Workstation: [id number]   Addendum I have seen the patient, examined her. I agree with the assessment and and plan and have edited the notes.   58 yo female with metastatic ER+/HER2- metastatic breast cancer to liver and bone, on Sacituzumab, presented to hospital with seizure.  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  I have personally reviewed her scan images with our neuro oncologist Dr. Buckley today.  We do not think she needs LP for cytology.  We recommend whole brain radiation, patient has been seen by radiation oncology today, for simulation and first treatment tomorrow.  She is on steroid.  Discharge per primary team, no additional oncology workup is planned.  Will follow-up with Dr. Davonna after discharge. Case reviewed with her today also. All Russians were counseled.  A total of 50 minutes for her visit today.  Onita Mattock MD 12/31/2023

## 2023-12-31 NOTE — Consult Note (Cosign Needed Addendum)
 Radiation Oncology         [phone] ________________________________  Initial Inpatient Consultation  Name: Monica Soto MRN: [id number]  Date of Service: 12/31/23  DOB: Sep 11, 1965  RR:Hnoipwh, Norleen, MD    REFERRING PHYSICIAN:  Dr. Lanny  DIAGNOSIS: The primary encounter diagnosis was Seizure-like activity [facility]). Diagnoses of Metastatic malignant neoplasm, unspecified site [facility]) and Hypokalemia were also pertinent to this visit.    ICD-10-CM   1. Seizure-like activity (HCC)  R56.9     2. Metastatic malignant neoplasm, unspecified site (HCC)  C79.9 LORazepam  (ATIVAN ) injection 1 mg    DISCONTINUED: dexamethasone  (DECADRON ) tablet 4 mg    3. Hypokalemia  E87.6       HISTORY OF PRESENT ILLNESS: Monica Soto is a 58 y.o. female seen at the request of Dr. 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 She progressed with liver disease that was biopsy confirmed as poorly differentiated adenocarcinoma consistent with breast primary on 12/07/22, and at that time it was ER positive at 2% moderate staining, PR negative, HER2 1+ negative. Her treatment was changed to Carboplatin /Abraxane /Keytruda . She developed progressive disease on PET on 05/30/23 with increase in her breast and liver disease and progressive skeletal disease. Her treatment was changed to Sacituzumab govitecan  on 06/10/2023 and her last infusion was on 12/25/23. She had an overall stable PET scan on 11/21/23. She has also met with Dr. Dominick in medical oncology at [facility] as she is contemplating possible clinical trial enrollment if additional progression was noted.   She presented  however on 12/28/23 to [facility] with altered mental status and a grand mal seizure was noted on presentation by ED staff and she was treated urgently and a STAT CT head wo contrast on 12/28/23 showed no focal findings. A CT A/P showed known liver disease and mild left hydronephrosis and hydroureter with perinepheric stranding and edema. Diffuse disease in the skeleton was noted. An EEG on 12/28/23 showed evidence of mild to moderate diffuse encephalopathy without seizures or epileptiform discharges. Similar findings were noted on 12/29/23 with overnight EEG, and MRI brain with and without contrast on 12/30/23 showed abnormal enhancement in the posterior left hemisphere affecting the parietal and superior occipital lobes in part contiguous with overlying dura and with a multinodular and gyriform pattern concerning for leptomeningeal tumor involvement. She had parenchymal versus leptomeningeal biparietal metastases and abnormal cerebellar leptomeningeal enhancement in the left hemisphere. She has been started on antiepileptic medication. We've been asked to evaluate her for palliative radiotherapy to the brain.    PREVIOUS RADIATION THERAPY: No  PAST MEDICAL HISTORY:  Past Medical History:  Diagnosis Date   Depression    Diabetes mellitus without complication (HCC)    Erosive esophagitis 04/26/2010   EGD by Dr. Shaaron, small hiatal hernia   GERD (gastroesophageal reflux disease)    History of stomach ulcers    IBS (irritable bowel syndrome)    Diarrhea predominant   Microscopic colitis 04/26/2010   Colonoscopy by Dr. Shaaron, good response with Entocort   Tubular adenoma of colon 04/26/2010   Junction of descending and sigmoid 40 CM from anus      PAST SURGICAL HISTORY: Past Surgical  History:  Procedure Laterality Date   ABDOMINAL HYSTERECTOMY     APPENDECTOMY  2/11   Dr. Floria with a delayed closure   BREAST BIOPSY Right 06/22/2022   US  RT BREAST BX W LOC DEV 1ST LESION IMG BX  SPEC US  GUIDE 06/22/2022 [facility] MAMMOGRAPHY   PORTACATH PLACEMENT N/A 07/16/2022   Procedure: INSERTION PORT-A-CATH;  Surgeon: [name], MD;  Location: [facility];  Service: General;  Laterality: N/A;   TONSILLECTOMY     TUBAL LIGATION      FAMILY HISTORY:  Family History  Problem Relation Age of Onset   Diabetes Mother    Coronary artery disease Mother    Healthy Father     SOCIAL HISTORY:  Social History   Socioeconomic History   Marital status: Married    Spouse name: Not on file   Number of children: Not on file   Years of education: Not on file   Highest education level: Not on file  Occupational History    Employer: [employer]    Comment: Third shift  Tobacco Use   Smoking status: Every Day    Current packs/day: 0.50    Types: Cigarettes   Smokeless tobacco: Never  Vaping Use   Vaping status: Never Used  Substance and Sexual Activity   Alcohol use: Yes    Alcohol/week: 1.0 standard drink of alcohol    Types: 1 Glasses of wine per week    Comment: seldom   Drug use: No   Sexual activity: Yes    Birth control/protection: Surgical  Other Topics Concern   Not on file  Social History Narrative   Not on file   Social Drivers of Health   Financial Resource Strain: Not on file  Food Insecurity: No Food Insecurity (12/29/2023)   Hunger Vital Sign    Worried About Running Out of Food in the Last Year: Never true    Ran Out of Food in the Last Year: Never true  Transportation Needs: No Transportation Needs (12/29/2023)   PRAPARE - Administrator, Civil Service (Medical): No    Lack of Transportation (Non-Medical): No  Physical Activity: Not on file  Stress: Not on file  Social Connections: Not on file  Intimate Partner Violence: Not At Risk (12/29/2023)   Humiliation, Afraid, Rape, and Kick questionnaire    Fear of Current or Ex-Partner: No    Emotionally Abused: No    Physically Abused: No    Sexually Abused: No   The  patient is married and lives in [place]. She works for a product manager. She is accompanied by her husband and daughter on our call today.   ALLERGIES: Keppra  [levetiracetam ]  MEDICATIONS:  Current Facility-Administered Medications  Medication Dose Route Frequency Provider Last Rate Last Admin   acetaminophen  (TYLENOL ) tablet 500 mg  500 mg Oral Q6H PRN Shona Laurence N, DO       cefTRIAXone  (ROCEPHIN ) 2 g in sodium chloride  0.9 % 100 mL IVPB  2 g Intravenous Q24H Hall, Carole N, DO 200 mL/hr at 12/31/23 0505 2 g at 12/31/23 0505   Chlorhexidine  Gluconate Cloth 2 % PADS 6 each  6 each Topical Daily Leotis Bogus, MD   6 each at 12/30/23 1029   dexamethasone  (DECADRON ) tablet 12 mg  12 mg Oral Once Reome, Earle J, RPH       Followed by   dexamethasone  (DECADRON ) tablet 4 mg  4 mg Oral Q6H Reome, Earle J, RPH  divalproex  (DEPAKOTE ) DR tablet 500 mg  500 mg Oral Q12H Stack, Colleen M, MD   500 mg at 12/31/23 1006   HYDROmorphone  (DILAUDID ) injection 1 mg  1 mg Intravenous Q4H PRN Leotis Bogus, MD   1 mg at 12/30/23 2024   lidocaine -prilocaine  (EMLA ) cream   Topical Daily PRN Singh, Prashant K, MD       LORazepam  (ATIVAN ) injection 1 mg  1 mg Intravenous PRN Lanell Donald Stagger, PA-C       LORazepam  (ATIVAN ) injection 2 mg  2 mg Intravenous Once PRN Singh, Prashant K, MD       melatonin tablet 5 mg  5 mg Oral QHS PRN Shona Terry SAILOR, DO       nicotine  (NICODERM CQ  - dosed in mg/24 hours) patch 14 mg  14 mg Transdermal Daily Shona Terry N, DO   14 mg at 12/30/23 1028   pantoprazole  (PROTONIX ) EC tablet 40 mg  40 mg Oral Daily PRN Singh, Prashant K, MD       polyethylene glycol (MIRALAX  / GLYCOLAX ) packet 17 g  17 g Oral Daily PRN Shona Terry SAILOR, DO       potassium chloride  SA (KLOR-CON  M) CR tablet 20 mEq  20 mEq Oral Once Singh, Prashant K, MD       prochlorperazine  (COMPAZINE ) injection 5 mg  5 mg Intravenous Q6H PRN Hall, Carole N, DO   5 mg at 12/29/23 9941    prochlorperazine  (COMPAZINE ) tablet 10 mg  10 mg Oral Q6H PRN Singh, Prashant K, MD       sodium chloride  flush (NS) 0.9 % injection 10-40 mL  10-40 mL Intracatheter Q12H Khatri, Pardeep, MD   20 mL at 12/31/23 1130   vancomycin  (VANCOREADY) IVPB 750 mg/150 mL  750 mg Intravenous Q12H Leotis Bogus, MD 150 mL/hr at 12/31/23 1128 750 mg at 12/31/23 1128   Facility-Administered Medications Ordered in Other Encounters  Medication Dose Route Frequency Provider Last Rate Last Admin   0.9 %  sodium chloride  infusion   Intravenous Continuous Kandala, Hyndavi, MD   Stopped at 09/24/23 1259    REVIEW OF SYSTEMS:  On review of systems, the patient reports that she is feeling much better now than leading up to her hospital evaluation, and she is much more alert today than in the 24 hours of her admission. She denies any headaches at this time. Her husband and daughter deny any focal concerns about her situation the weeks leading up to her hospitalization, but the patient and husband state she was not feeling well a few hours prior to her presentation in the ED. She had confusion, delay in fine motor skills, loss of awareness of physical space, and lack of verbal response to prompting a few hours prior to her evaluation this past Saturday evening. She had fluttering of her eyes her husband noted in the care on the way to the hospital and she had a witnessed tonic clonic seizure when moving from the car to wheelchair at the ED [facility] She has not had any other episodes of shaking or loss or change in level of consciousness. She does have an open area in the right breast seen below in photo taken during her hospitalization. She has had tiredness, loss of taste, and intermittent dizziness that has been attributed to targeted therapy No other complaints are verbalized.      PHYSICAL EXAM:  Wt Readings from Last 3 Encounters:  12/29/23 156 lb 1.4 oz (70.8 kg)  12/17/23 156 lb 1.4 oz (70.8 kg)  12/03/23 153  lb 9.6 oz (69.7 kg)   Temp Readings from Last 3 Encounters:  12/31/23 97.8 F (36.6 C) (Axillary)  12/25/23 (!) 97.2 F (36.2 C) (Oral)  12/24/23 (!) 97.4 F (36.3 C) (Tympanic)   BP Readings from Last 3 Encounters:  12/31/23 112/61  12/25/23 (!) 147/97  12/24/23 130/67   Pulse Readings from Last 3 Encounters:  12/31/23 84  12/25/23 76  12/24/23 77   Pain Assessment Pain Score: 2 /10  Unable to assess given encounter type  In media tab taken on 12/30/23 of right breast wound  KPS = 80  100 - Normal; no complaints; no evidence of disease. 90   - Able to carry on normal activity; minor signs or symptoms of disease. 80   - Normal activity with effort; some signs or symptoms of disease. 42   - Cares for self; unable to carry on normal activity or to do active work. 60   - Requires occasional assistance, but is able to care for most of his personal needs. 50   - Requires considerable assistance and frequent medical care. 40   - Disabled; requires special care and assistance. 30   - Severely disabled; hospital admission is indicated although death not imminent. 20   - Very sick; hospital admission necessary; active supportive treatment necessary. 10   - Moribund; fatal processes progressing rapidly. 0     - Dead  Karnofsky DA, Abelmann WH, Craver LS and Burchenal JH [phone]) The use of the nitrogen mustards in the palliative treatment of carcinoma: with particular reference to bronchogenic carcinoma Cancer 1 634-56  LABORATORY DATA:  Lab Results  Component Value Date   WBC 11.1 (H) 12/31/2023   HGB 10.5 (L) 12/31/2023   HCT 32.8 (L) 12/31/2023   MCV 97.9 12/31/2023   PLT 251 12/31/2023   Lab Results  Component Value Date   NA 144 12/31/2023   K 2.9 (L) 12/31/2023   CL 107 12/31/2023   CO2 24 12/31/2023   Lab Results  Component Value Date   ALT 16 12/31/2023   AST 15 12/31/2023   ALKPHOS 150 (H) 12/31/2023   BILITOT 0.7 12/31/2023     RADIOGRAPHY: MR Brain W  and Wo Contrast Result Date: 12/30/2023 EXAM: MRI BRAIN WITH AND WITHOUT CONTRAST 12/30/2023 10:09:00 AM TECHNIQUE: Multiplanar multisequence MRI of the head/brain was performed with and without the administration of intravenous contrast. 8 mL gadobutrol  (GADAVIST ) 1 MMOL/ML IV solution was administered. COMPARISON: Head CT without contrast 12/28/2023. CLINICAL HISTORY: 58 year old female. Seizure, new-onset, no history of trauma; new seizure, history of metastatic breast cancer, query brain metastases. FINDINGS: BRAIN AND VENTRICLES: No acute infarct. No acute intracranial hemorrhage. No midline shift or significant intracranial mass effect. No hydrocephalus. The sella is unremarkable. Normal flow voids. Following contrast, the major dural venous sinuses are enhancing and appear to be patent. abnormal enhancement in the posterior left hemisphere affecting the parietal and superior occipital lobes is in part contiguous with the overlying dura (series 12 image 84) but also has a multinodular and gyriform pattern (series 12 image 74 and series 13 image 9) indicating leptomeningeal tumor involvement. Additional subcentimeter parenchymal versus leptomeningeal biparietal metastases are seen on series 12 image 89 and series 13 image 9. There is also abnormal cerebellar leptomeningeal enhancement in the left hemisphere on series 12 image 21. Loss of the normal FLAIR signal suppression in the sulci here, but minimal associated cerebral edema at this  time, primarily in the occipital lobe (series 6 images 15 and 12). SWI reveals mild hemosiderin in association with some of the bulky leptomeningeal disease (series 7 image 50) in the left occipital lobe. No ependymal enhancement. No abnormality diffusion. Thin slice coronal images are motion degraded, the hippocampal formations and mesial temporal lobes appear grossly symmetric and negative. No other acute or chronic cerebral blood products identified. Deep gray nuclei and  brainstem appear negative. ORBITS: No acute abnormality. No orbital metastases identified. SINUSES: No acute abnormality. Paranasal sinuses and mastoids are well aerated. BONES AND SOFT TISSUES: Bone marrow signal is diffusely abnormal, and suggests diffuse skull and visible cervical spine skeletal metastatic disease. No acute soft tissue abnormality. No scalp metastases identified. IMPRESSION: 1. 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 2. Evidence of overlying diffuse skeletal metastatic disease. 3. Minimal associated cerebral edema at this time. No intracranial mass effect. Electronically signed by: Helayne Hurst MD 12/30/2023 10:40 AM EST RP Workstation: [id number]   Overnight EEG with video Result Date: 12/30/2023 Shelton Arlin KIDD, MD     12/31/2023  6:08 AM Patient Name: Monica Soto MRN: [id number] Epilepsy Attending: Arlin KIDD Shelton Referring Physician/Provider: Matthews Elida HERO, MD Duration: 12/29/2023 1407 to 12/30/2023 1530 Patient history: 58 yo woman with hx breast cancer metastatic to bone and liver transferred from [facility] after first-time seizure. EEG to evaluate for seizure Level of alertness: Awake, asleep AEDs during EEG study: VPA Technical aspects: This EEG study was done with scalp electrodes positioned according to the 10-20 International system of electrode placement. Electrical activity was reviewed with band pass filter of 1-70Hz , sensitivity of 7 uV/mm, display speed of 24mm/sec with a 60Hz  notched filter applied as appropriate. EEG data were recorded continuously and digitally stored.  Video monitoring was available and reviewed as appropriate. Description: The posterior dominant rhythm consists of 9-10 Hz activity of moderate voltage (25-35 uV) seen predominantly in posterior head regions, symmetric and reactive to eye opening and eye closing. Sleep  was characterized by vertex waves, sleep spindles (12 to 14 Hz), maximal frontocentral region.  There is intermittent generalized 3 to 6 Hz theta-delta slowing. Hyperventilation and photic stimulation were not performed.   EEG was disconnected between 12/30/2023 0908 to 1029 for imaging. ABNORMALITY - Intermittent slow, generalized IMPRESSION: This study is suggestive of mild generalized cerebral dysfunction (encephalopathy). No seizures or epileptiform discharges were seen throughout the recording. Arlin KIDD Shelton   EEG adult Result Date: 12/29/2023 Gregg Lek, MD     12/29/2023  5:22 PM Patient Name: Monica Soto MRN: [id number] Epilepsy Attending: Lek Gregg Referring Physician/Provider: No ref. provider found     Date: 12/29/2023 Duration: 22 minutes Patient history: 58 year old woman with metastatic brain cancer who presents with new seizure. Level of alertness: Awake, drowsy AEDs during EEG study: Depakote  Technical aspects: This EEG study was done with scalp electrodes positioned according to the 10-20 International system of electrode placement. Electrical activity was reviewed with band pass filter of 1-70Hz , sensitivity of 7 uV/mm, display speed of 4mm/sec with a 60Hz  notched filter applied as appropriate. EEG data were recorded continuously and digitally stored.  Video monitoring was available and reviewed as appropriate. Description: EEG showed continuous generalized polymorphic sharply contoured 3 to 6 Hz theta-delta slowing. Hyperventilation and photic stimulation were not performed.   ABNORMALITY - Continuous slow, generalized IMPRESSION: This study is suggestive of mild to  moderate diffuse encephalopathy, nonspecific etiology but likely related to sedation, toxic-metabolic etiology. No seizures or epileptiform discharges were seen throughout the recording. Pastor Falling MD Neurology    CT ABDOMEN PELVIS WO CONTRAST Result Date: 12/29/2023 EXAM: CT ABDOMEN AND PELVIS WITHOUT CONTRAST  12/29/2023 12:50:58 PM TECHNIQUE: CT of the abdomen and pelvis was performed without the administration of intravenous contrast. Multiplanar reformatted images are provided for review. Automated exposure control, iterative reconstruction, and/or weight-based adjustment of the mA/kV was utilized to reduce the radiation dose to as low as reasonably achievable. COMPARISON: PET CT 11/21/2023. CLINICAL HISTORY: Abdominal pain, acute, nonlocalized. FINDINGS: LOWER CHEST: Atelectasis and scarring in the lung bases. Cardiac enlargement. LIVER: Cirrhotic changes in the liver with diffusely nodular contour. Poorly defined nodular hypodense lesions throughout the liver consistent with known metastatic disease. These are poorly demonstrated on noncontrast imaging but appear grossly similar to the previous study. GALLBLADDER AND BILE DUCTS: Gallbladder and bile ducts are normal. SPLEEN: The spleen is enlarged without focal lesion. PANCREAS: The pancreas is normal. ADRENAL GLANDS: The adrenal glands are normal. KIDNEYS, URETERS AND BLADDER: Mild hydronephrosis and hydroureter on the left with stranding and edema around the left kidney. No ureteral stones are demonstrated. This could be due to recently passed stone, extrinsic compression or reflux. The bladder is diffusely distended without wall thickening or intraluminal filling defects. No bladder stones. GI AND BOWEL: Stomach, small bowel, and colon are not abnormally distended. No wall thickening or inflammatory stranding are appreciated. The appendix is surgically absent. PERITONEUM AND RETROPERITONEUM: No ascites. No free air. VASCULATURE: Aorta is normal in caliber. LYMPH NODES: No lymphadenopathy. REPRODUCTIVE ORGANS: The uterus is surgically absent. No abnormal adnexal masses. BONES AND SOFT TISSUES: Diffuse heterogeneous sclerosis throughout the visualized skeleton consistent with diffuse bone metastasis. No focal soft tissue abnormality. IMPRESSION: 1. Liver metastases  on a background of cirrhosis, overall grossly similar to prior, without definite interval progression identified on this noncontrast exam. The spleen is enlarged. 2. Mild left hydronephrosis and hydroureter with perinephric stranding and edema, which may reflect extrinsic compression or reflux; no ureteral calculi identified. 3. Diffuse osseous metastatic disease with heterogeneous sclerosis throughout the visualized skeleton. Electronically signed by: Elsie Gravely MD 12/29/2023 01:05 PM EST RP Workstation: [id number]   CT Head Wo Contrast Result Date: 12/28/2023 EXAM: CT HEAD WITHOUT CONTRAST 12/28/2023 08:43:23 PM TECHNIQUE: CT of the head was performed without the administration of intravenous contrast. Automated exposure control, iterative reconstruction, and/or weight based adjustment of the mA/kV was utilized to reduce the radiation dose to as low as reasonably achievable. COMPARISON: 05/03/2011 CLINICAL HISTORY: Seizure, new-onset, no history of trauma. FINDINGS: BRAIN AND VENTRICLES: No acute hemorrhage. No evidence of acute infarct. No extra-axial collection. No mass effect or midline shift. ORBITS: No acute abnormality. SINUSES: No acute abnormality. SOFT TISSUES AND SKULL: No acute soft tissue abnormality. No skull fracture. IMPRESSION: 1. No acute intracranial abnormality. Electronically signed by: Oneil Devonshire MD 12/28/2023 08:51 PM EST RP Workstation: [id number]      IMPRESSION/PLAN: 1. 58 y.o. woman with progressive metastatic ER/PR positive breast cancer with leptomeningeal disease involving the brain. Dr. Patrcia has reviewed her imaging reports and personally reviewed her brain imaging. We have conferred with the on call oncology team and the patient's primary oncologist. While the patient may be contemplating further discussion with Dr. Dominick at [facility] for clinical trial enrollment, at this time, her primary team and on call team agree with proceeding in treating her brain disease. Dr.  Patrcia recommends a  total spine MRI to rule out other sites of leptomeningeal disease, and would offer whole brain radiotherapy due to the multifocal areas of this disease distrubution.  We discussed the risks, benefits, short, and long term effects of radiotherapy, as well as the palliative intent, and the patient is interested in proceeding. We discussed the delivery and logistics of radiotherapy and Dr. Patrcia recommends 10 fractions over 2 weeks of radiotherapy. Given the holiday weekend, if she responds well to steroids (which I've started), she would have simulation and treatment tomorrow (01/01/24), and resume on Monday (01/06/24).  We discussed the rationale for continuing antiepileptic medication per neurology, and the rationale for steroids to improve the edema related to her disease. She is aware of the side effect profile of steroids and the recommendation to continue taking oral PPI therapy with Protonix  while taking the steroids. I sent in a new prescription for her to have in the outpatient setting and we will coordinate her post treatment taper. Further discussion regarding her eligibility for trial and or additional systemic therapy will be deferred to her primary oncology team.  2. Claustrophobia. We discussed using Ativan  prior to her total spine MRI and this has been ordered. I also offered po Ativan  prior to radiation treatments given that she will wear an immobilizing mask in order to receive her treatment. A new prescription was also sent to her pharmacy for this and side effect profile reviewed with the patient and her family.  3. Seizure activity secondary to #1. The patient will follow up with neurology in the outpatient setting for management and decision making on duration of antiepileptic medication. She is aware she would have to be seizure free for 6 months in order to drive by [place] DMV law.  4. Wound of the right breast secondary to #1. I will discuss this further with Dr. Patrcia  as well to see if he recommends any additional discussion of palliative radiotherapy to the breast as it is an open wound and has some drainage. For now, local wound care with dressings would be appropriate for what is felt to be necrotic tumor eroding the skin.   In a visit lasting 75 minutes, greater than 50% of the time was spent by phone and in floor time discussing the patient's condition, in preparation for the discussion, and coordinating the patient's care. The patient's husband and daughter joined today's call.      Donald KYM Husband, [facility]   Page Me   Discussed with  _____________________________________  Donnice FELIX Patrcia, M.D.

## 2024-01-01 ENCOUNTER — Other Ambulatory Visit: Payer: Self-pay

## 2024-01-01 ENCOUNTER — Ambulatory Visit

## 2024-01-01 ENCOUNTER — Other Ambulatory Visit (HOSPITAL_COMMUNITY): Payer: Self-pay

## 2024-01-01 ENCOUNTER — Ambulatory Visit: Admitting: Radiation Oncology

## 2024-01-01 DIAGNOSIS — C50911 Malignant neoplasm of unspecified site of right female breast: Secondary | ICD-10-CM

## 2024-01-01 LAB — CBC WITH DIFFERENTIAL/PLATELET
Abs Immature Granulocytes: 0.14 K/uL — ABNORMAL HIGH (ref 0.00–0.07)
Basophils Absolute: 0 K/uL (ref 0.0–0.1)
Basophils Relative: 0 %
Eosinophils Absolute: 0 K/uL (ref 0.0–0.5)
Eosinophils Relative: 0 %
HCT: 32.5 % — ABNORMAL LOW (ref 36.0–46.0)
Hemoglobin: 10.2 g/dL — ABNORMAL LOW (ref 12.0–15.0)
Immature Granulocytes: 1 %
Lymphocytes Relative: 5 %
Lymphs Abs: 0.5 K/uL — ABNORMAL LOW (ref 0.7–4.0)
MCH: 31 pg (ref 26.0–34.0)
MCHC: 31.4 g/dL (ref 30.0–36.0)
MCV: 98.8 fL (ref 80.0–100.0)
Monocytes Absolute: 0.2 K/uL (ref 0.1–1.0)
Monocytes Relative: 2 %
Neutro Abs: 9.3 K/uL — ABNORMAL HIGH (ref 1.7–7.7)
Neutrophils Relative %: 92 %
Platelets: 224 K/uL (ref 150–400)
RBC: 3.29 MIL/uL — ABNORMAL LOW (ref 3.87–5.11)
RDW: 16.9 % — ABNORMAL HIGH (ref 11.5–15.5)
WBC: 10.3 K/uL (ref 4.0–10.5)
nRBC: 0 % (ref 0.0–0.2)

## 2024-01-01 LAB — COMPREHENSIVE METABOLIC PANEL WITH GFR
ALT: 13 U/L (ref 0–44)
AST: 16 U/L (ref 15–41)
Albumin: 2.7 g/dL — ABNORMAL LOW (ref 3.5–5.0)
Alkaline Phosphatase: 142 U/L — ABNORMAL HIGH (ref 38–126)
Anion gap: 12 (ref 5–15)
BUN: 7 mg/dL (ref 6–20)
CO2: 21 mmol/L — ABNORMAL LOW (ref 22–32)
Calcium: 8.3 mg/dL — ABNORMAL LOW (ref 8.9–10.3)
Chloride: 107 mmol/L (ref 98–111)
Creatinine, Ser: 0.56 mg/dL (ref 0.44–1.00)
GFR, Estimated: >60 mL/min (ref >60)
Glucose, Bld: 149 mg/dL — ABNORMAL HIGH (ref 70–99)
Potassium: 3.7 mmol/L (ref 3.5–5.1)
Sodium: 140 mmol/L (ref 135–145)
Total Bilirubin: 0.5 mg/dL (ref 0.0–1.2)
Total Protein: 5.7 g/dL — ABNORMAL LOW (ref 6.5–8.1)

## 2024-01-01 LAB — RAD ONC ARIA SESSION SUMMARY
Course Elapsed Days: 0
Plan Fractions Treated to Date: 1
Plan Prescribed Dose Per Fraction: 3 Gy
Plan Total Fractions Prescribed: 10
Plan Total Prescribed Dose: 30 Gy
Reference Point Dosage Given to Date: 3 Gy
Reference Point Session Dosage Given: 3 Gy
Session Number: 1

## 2024-01-01 LAB — MAGNESIUM: Magnesium: 1.9 mg/dL (ref 1.7–2.4)

## 2024-01-01 MED ORDER — DOXYCYCLINE HYCLATE 100 MG PO TABS
100.0000 mg | ORAL_TABLET | Freq: Two times a day (BID) | ORAL | 0 refills | Status: AC
  Filled 2024-01-01: qty 14, 7d supply, fill #0

## 2024-01-01 MED ORDER — ACETAMINOPHEN 500 MG PO TABS: 500.0000 mg | ORAL_TABLET | Freq: Four times a day (QID) | ORAL | 0 refills | Status: AC | PRN

## 2024-01-01 MED ORDER — HEPARIN SOD (PORK) LOCK FLUSH 100 UNIT/ML IV SOLN: 500.0000 [IU] | Freq: Once | INTRAVENOUS | Status: AC

## 2024-01-01 MED ORDER — POLYETHYLENE GLYCOL 3350 17 G PO PACK: 17.0000 g | PACK | Freq: Every day | ORAL | Status: AC | PRN

## 2024-01-01 MED ORDER — NICOTINE 14 MG/24HR TD PT24: 14.0000 mg | MEDICATED_PATCH | Freq: Every day | TRANSDERMAL | Status: AC

## 2024-01-01 MED ORDER — DEXAMETHASONE 4 MG PO TABS
4.0000 mg | ORAL_TABLET | Freq: Four times a day (QID) | ORAL | 1 refills | Status: DC
  Filled 2024-01-01: qty 120, 30d supply, fill #0

## 2024-01-01 MED ORDER — PANTOPRAZOLE SODIUM 40 MG PO TBEC
40.0000 mg | DELAYED_RELEASE_TABLET | Freq: Every day | ORAL | Status: DC
  Administered 2024-01-01: 40 mg via ORAL
  Filled 2024-01-01: qty 1

## 2024-01-01 MED ORDER — HEPARIN SOD (PORK) LOCK FLUSH 100 UNIT/ML IV SOLN
500.0000 [IU] | INTRAVENOUS | Status: AC | PRN
  Administered 2024-01-01: 500 [IU]
  Filled 2024-01-01 (×2): qty 5

## 2024-01-01 MED ORDER — DIVALPROEX SODIUM 500 MG PO DR TAB
500.0000 mg | DELAYED_RELEASE_TABLET | Freq: Two times a day (BID) | ORAL | 0 refills | Status: DC
  Filled 2024-01-01: qty 6, 3d supply, fill #0
  Filled 2024-01-01: qty 54, 27d supply, fill #0

## 2024-01-01 MED ORDER — DOXYCYCLINE HYCLATE 100 MG PO TABS
100.0000 mg | ORAL_TABLET | Freq: Two times a day (BID) | ORAL | Status: DC
  Administered 2024-01-01: 100 mg via ORAL
  Filled 2024-01-01: qty 1

## 2024-01-01 MED ORDER — PANTOPRAZOLE SODIUM 40 MG PO TBEC
40.0000 mg | DELAYED_RELEASE_TABLET | Freq: Every day | ORAL | 2 refills | Status: AC
  Filled 2024-01-01: qty 30, 30d supply, fill #0

## 2024-01-01 MED ORDER — LORAZEPAM 0.5 MG PO TABS
ORAL_TABLET | ORAL | 0 refills | Status: DC
  Filled 2024-01-01: qty 10, 10d supply, fill #0

## 2024-01-01 NOTE — Discharge Summary (Signed)
 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  Family reports she has diarrhea for last 4 days associated with nausea without vomiting.     She also had confusion and fatigue.  Family decided to bring her in the ED.  While they were getting her into the car they noticed that her eyes were fluttering and she had blank stare, lasting few seconds.  She has another episode of eyes twitching, her arms and legs began to shake lasting for 30 seconds witnessed by nursing staff.  Patient has received 6 mg of IV Ativan  in the ED.  EDP discussed the case with neurology who recommended Keppra  load 2.5 mg x 1.  Neurologist recommended admission in the [facility] for seizure workup and management.  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 Brain metastasis. - Oncology and radiation/oncology  input greatly appreciated. - Continue with Decadron  on discharge, she will be given Protonix  for GI prophylaxis as well - Status post spinal MRI, with no evidence of leptomeningeal spread, so radiation oncology did simulation and started her brain radiation this afternoon, to continue further treatment as an outpatient.  Continue Trodelvy . Medical oncology consulted. 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  Due to metastatic breast cancer with newfound mets to the brain.  Seen by neurology, CT head unremarkable, MRI was obtained for brain metastasis, EEG stable, continue to monitor on AEDs, speech eval, PT eval. neurology following, oncology also requested to see the patient   Acute metabolic encephalopathy in the setting of new onset seizure versus syncope - Resolved, mentation back at baseline - UA negative for infection. -Urine drug screen negative. Continue AEDs, hydrate with IV fluids and monitor GI symptoms have resolved   Right breast wound due to her underlying malignancy.  Present on admission and chronic. Wound care consulted.  Continue with wound care, empirically on IV antibiotics during hospital stay, will discharge on total 1 week of Doxy as an outpatient    Diarrhea, dehydration, hyponatremia, hypotension likely due to suspect chemotherapy-induced diarrhea: Reportedly the patient was having diarrhea days prior to admission. Resolved   Hypokalemia Replaced.  Continue to monitor   Elevated alkaline phosphatase in the setting of bone metastasis: Alkaline phosphatase 258 Monitor for now   Tobacco use: Nicotine  patch   Hematuria mild hydronephrosis: UA shows positive hemoglobin. Avoid anticoagulation or antiplatelets.  Creatinine is stable.  continue to monitor.  Discharge Diagnoses:  Principal Problem:   New onset seizure  [facility])    Discharge Instructions  Discharge Instructions     Diet - low sodium heart healthy   Complete by: As directed    Discharge wound care:   Complete by: As directed    Wound care  Every other day    Comments: Cleanse breast wounds with saline, pat dry. Cover open areas with single layer of xeroform and top with foam. Change every other day   Increase activity slowly   Complete by: As directed       Allergies as of 01/01/2024       Reactions   Keppra  [levetiracetam ] Other (See Comments)   agitation        Medication List     STOP taking these medications    GOODY HEADACHE PO   ibuprofen  200 MG tablet Commonly known as: ADVIL        TAKE these medications    acetaminophen  500 MG tablet Commonly known as: TYLENOL  Take 1 tablet (500 mg total) by mouth every 6 (six) hours as needed for mild pain (pain score 1-3), fever or headache.   dexamethasone  4 MG tablet Commonly known as: DECADRON  Take 1 tablet (4 mg total) by mouth 4 (four) times daily.   diphenoxylate -atropine  2.5-0.025 MG tablet Commonly known as: LOMOTIL  Take 2 tablets after first loose stool and 1 after each loose stool thereafter.  Do not exceed 8 tablets in 1 day.   divalproex  500 MG DR tablet Commonly known as: DEPAKOTE  Take 1 tablet (500 mg total) by mouth every 12 (twelve) hours.   doxycycline  100 MG tablet Commonly known as: VIBRA -TABS Take 1 tablet (100 mg total) by mouth every 12 (twelve) hours for 7 days.   fluticasone  50 MCG/ACT nasal spray Commonly known as: FLONASE  Place 1 spray into both nostrils daily. What changed:  when to take this reasons to take this   furosemide  20 MG tablet Commonly known as: LASIX  TAKE 1 TABLET(20 MG) BY MOUTH DAILY AS NEEDED FOR SWELLING   lidocaine -prilocaine  cream Commonly known as: EMLA  APPLY TOPICALLY TO THE AFFECTED AREA 1 TIME   LORazepam  0.5 MG tablet Commonly known as: ATIVAN  Take 1 tab by mouth 1 hour prior to radiation    magnesium  oxide 400 (240 Mg) MG tablet Commonly known as: MAG-OX Take 1 tablet (400 mg total) by mouth 2 (two) times daily.   nicotine  14 mg/24hr patch Commonly known as: NICODERM CQ  - dosed in mg/24 hours Place 1 patch (14 mg total) onto the skin daily. Start taking on: January 02, 2024   ondansetron  8 MG tablet Commonly known as: ZOFRAN  TAKE 1 TABLET(8 MG) BY MOUTH EVERY 8 HOURS AS NEEDED FOR NAUSEA OR VOMITING. START THIRD DAY AFTER DOXORUBICIN/ CYCLOPHOSPHAMIDE CHEMOTHERAPY   oxyCODONE  5 MG immediate release tablet Commonly known as: Oxy IR/ROXICODONE  Take 1 tablet (5 mg total) by mouth 2 (two) times daily as needed for severe pain (pain score 7-10).   pantoprazole  40 MG tablet Commonly known as: Protonix  Take 1 tablet (40 mg total) by mouth daily. What changed:  when to take this reasons to take this   polyethylene glycol 17 g packet Commonly known as: MIRALAX  / GLYCOLAX  Take 17 g by mouth daily as needed for mild constipation.   prochlorperazine  10 MG tablet Commonly known as: COMPAZINE  Take 1 tablet (10 mg total) by mouth every 6 (six) hours as needed for nausea or vomiting.   SALONPAS EX  Apply 1 Application topically daily as needed.               Discharge Care Instructions  (From admission, onward)           Start     Ordered   01/01/24 0000  Discharge wound care:       Comments: Wound care  Every other day    Comments: Cleanse breast wounds with saline, pat dry. Cover open areas with single layer of xeroform and top with foam. Change every other day   01/01/24 1426            Contact information for after-discharge care     Home Medical Care     [facility]) .   Service: Home Health Services Contact information: [address] [place]  [address] [phone]                    Allergies  Allergen Reactions   Keppra  [Levetiracetam ] Other (See Comments)    agitation     Consultations: Neurology Oncology Radiation oncology   Procedures/Studies: MR TOTAL SPINE METS SCREENING Result Date: 01/01/2024 CLINICAL DATA:  58 year old female with history of breast cancer, metastatic disease evaluation. EXAM: MRI TOTAL SPINE WITHOUT AND WITH CONTRAST TECHNIQUE: Multisequence MR imaging of the spine from the cervical spine to the sacrum was performed prior to and following IV contrast administration for evaluation of spinal metastatic disease. CONTRAST:  7mL GADAVIST  GADOBUTROL  1 MMOL/ML IV SOLN COMPARISON:  Comparison made with prior brain MRI from 12/30/2023 as well as PET-CT from 11/21/2023. FINDINGS: MRI CERVICAL SPINE FINDINGS Alignment: Straightening of the normal cervical lordosis. No listhesis. Vertebrae: Widespread osseous metastases seen diffusely throughout the cervical spine. There is involvement of nearly all levels. No extra osseous or epidural extension of tumor. No pathologic compression fracture or other complicating feature. Cord: Normal signal and morphology. No abnormal enhancement. No evidence for leptomeningeal metastatic disease within the cervical spinal cord. Posterior Fossa, vertebral arteries, paraspinal tissues: Unremarkable. Disc levels: Minimal noncompressive disc bulging noted at C4-5. No other significant disc pathology or stenosis within the cervical spine. MRI THORACIC SPINE FINDINGS Alignment: Mild exaggeration of the normal thoracic kyphosis. No listhesis. Vertebrae: Diffuse skeletal osseous metastases seen throughout the thoracic spine. There is involvement of nearly all levels. No extra osseous or epidural extension of tumor. No pathologic compression fracture. Cord: Normal signal and morphology. No abnormal enhancement. No evidence for leptomeningeal metastatic disease. Paraspinal and other soft tissues: Unremarkable. Disc levels: No significant disc pathology or stenosis within the thoracic spine. MRI LUMBAR SPINE FINDINGS Segmentation:  Standard. Lowest well-formed disc space labeled the L5-S1 level. Alignment: Physiologic with preservation of the normal lumbar lordosis. No listhesis. Vertebrae: Widespread osseous metastatic disease seen diffusely throughout the lumbar spine. Involvement of nearly all levels, with largest lesion measuring 2.2 cm within the L4 vertebral body. No extra osseous or epidural extension of tumor. No pathologic compression fracture or other complicating feature. Conus medullaris: Extends to the T12-L1 level and appears normal. No abnormal enhancement or evidence for leptomeningeal metastatic disease. Paraspinal and other soft tissues: Unremarkable. Disc levels: Degenerative disc disease with disc bulging and reactive endplate spurring at L5-S1 without significant spinal stenosis. IMPRESSION: 1. 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 No extra osseous or epidural extension of tumor. No pathologic compression fracture or other complicating features. 2. No evidence for leptomeningeal metastatic disease within the cervical, thoracic, or lumbar spine.  3. Degenerative disc disease at L5-S1 without significant stenosis. Electronically Signed   By: Morene Hoard M.D.   On: 01/01/2024 02:18   MR Brain W and Wo Contrast Result Date: 12/30/2023 EXAM: MRI BRAIN WITH AND WITHOUT CONTRAST 12/30/2023 10:09:00 AM TECHNIQUE: Multiplanar multisequence MRI of the head/brain was performed with and without the administration of intravenous contrast. 8 mL gadobutrol  (GADAVIST ) 1 MMOL/ML IV solution was administered. COMPARISON: Head CT without contrast 12/28/2023. CLINICAL HISTORY: 58 year old female. Seizure, new-onset, no history of trauma; new seizure, history of metastatic breast cancer, query brain metastases. FINDINGS: BRAIN AND VENTRICLES: No acute infarct. No acute intracranial hemorrhage. No midline shift or significant intracranial mass effect. No hydrocephalus. The sella  is unremarkable. Normal flow voids. Following contrast, the major dural venous sinuses are enhancing and appear to be patent. abnormal enhancement in the posterior left hemisphere affecting the parietal and superior occipital lobes is in part contiguous with the overlying dura (series 12 image 84) but also has a multinodular and gyriform pattern (series 12 image 74 and series 13 image 9) indicating leptomeningeal tumor involvement. Additional subcentimeter parenchymal versus leptomeningeal biparietal metastases are seen on series 12 image 89 and series 13 image 9. There is also abnormal cerebellar leptomeningeal enhancement in the left hemisphere on series 12 image 21. Loss of the normal FLAIR signal suppression in the sulci here, but minimal associated cerebral edema at this time, primarily in the occipital lobe (series 6 images 15 and 12). SWI reveals mild hemosiderin in association with some of the bulky leptomeningeal disease (series 7 image 50) in the left occipital lobe. No ependymal enhancement. No abnormality diffusion. Thin slice coronal images are motion degraded, the hippocampal formations and mesial temporal lobes appear grossly symmetric and negative. No other acute or chronic cerebral blood products identified. Deep gray nuclei and brainstem appear negative. ORBITS: No acute abnormality. No orbital metastases identified. SINUSES: No acute abnormality. Paranasal sinuses and mastoids are well aerated. BONES AND SOFT TISSUES: Bone marrow signal is diffusely abnormal, and suggests diffuse skull and visible cervical spine skeletal metastatic disease. No acute soft tissue abnormality. No scalp metastases identified. IMPRESSION: 1. 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 2. Evidence of overlying diffuse skeletal metastatic disease. 3. Minimal associated cerebral edema at this  time. No intracranial mass effect. Electronically signed by: Helayne Hurst MD 12/30/2023 10:40 AM EST RP Workstation: [id number]   Overnight EEG with video Result Date: 12/30/2023 Shelton Arlin KIDD, MD     12/31/2023  6:08 AM Patient Name: Monica Soto MRN: [id number] Epilepsy Attending: Arlin KIDD Shelton Referring Physician/Provider: Matthews Elida HERO, MD Duration: 12/29/2023 1407 to 12/30/2023 1530 Patient history: 58 yo woman with hx breast cancer metastatic to bone and liver transferred from [facility] after first-time seizure. EEG to evaluate for seizure Level of alertness: Awake, asleep AEDs during EEG study: VPA Technical aspects: This EEG study was done with scalp electrodes positioned according to the 10-20 International system of electrode placement. Electrical activity was reviewed with band pass filter of 1-70Hz , sensitivity of 7 uV/mm, display speed of 52mm/sec with a 60Hz  notched filter applied as appropriate. EEG data were recorded continuously and digitally stored.  Video monitoring was available and reviewed as appropriate. Description: The posterior dominant rhythm consists of 9-10 Hz activity of moderate voltage (25-35 uV) seen predominantly in posterior head regions, symmetric and reactive to eye opening and eye closing. Sleep was characterized  by vertex waves, sleep spindles (12 to 14 Hz), maximal frontocentral region.  There is intermittent generalized 3 to 6 Hz theta-delta slowing. Hyperventilation and photic stimulation were not performed.   EEG was disconnected between 12/30/2023 0908 to 1029 for imaging. ABNORMALITY - Intermittent slow, generalized IMPRESSION: This study is suggestive of mild generalized cerebral dysfunction (encephalopathy). No seizures or epileptiform discharges were seen throughout the recording. Arlin MALVA Krebs   EEG adult Result Date: 12/29/2023 Gregg Lek, MD     12/29/2023  5:22 PM Patient Name: LOVENE MARET MRN: [id number] Epilepsy Attending: Lek Gregg  Referring Physician/Provider: No ref. provider found     Date: 12/29/2023 Duration: 22 minutes Patient history: 58 year old woman with metastatic brain cancer who presents with new seizure. Level of alertness: Awake, drowsy AEDs during EEG study: Depakote  Technical aspects: This EEG study was done with scalp electrodes positioned according to the 10-20 International system of electrode placement. Electrical activity was reviewed with band pass filter of 1-70Hz , sensitivity of 7 uV/mm, display speed of 63mm/sec with a 60Hz  notched filter applied as appropriate. EEG data were recorded continuously and digitally stored.  Video monitoring was available and reviewed as appropriate. Description: EEG showed continuous generalized polymorphic sharply contoured 3 to 6 Hz theta-delta slowing. Hyperventilation and photic stimulation were not performed.   ABNORMALITY - Continuous slow, generalized IMPRESSION: This study is suggestive of mild to moderate diffuse encephalopathy, nonspecific etiology but likely related to sedation, toxic-metabolic etiology. No seizures or epileptiform discharges were seen throughout the recording. Lek Gregg MD Neurology    CT ABDOMEN PELVIS WO CONTRAST Result Date: 12/29/2023 EXAM: CT ABDOMEN AND PELVIS WITHOUT CONTRAST 12/29/2023 12:50:58 PM TECHNIQUE: CT of the abdomen and pelvis was performed without the administration of intravenous contrast. Multiplanar reformatted images are provided for review. Automated exposure control, iterative reconstruction, and/or weight-based adjustment of the mA/kV was utilized to reduce the radiation dose to as low as reasonably achievable. COMPARISON: PET CT 11/21/2023. CLINICAL HISTORY: Abdominal pain, acute, nonlocalized. FINDINGS: LOWER CHEST: Atelectasis and scarring in the lung bases. Cardiac enlargement. LIVER: Cirrhotic changes in the liver with diffusely nodular contour. Poorly defined nodular hypodense lesions throughout the liver consistent with  known metastatic disease. These are poorly demonstrated on noncontrast imaging but appear grossly similar to the previous study. GALLBLADDER AND BILE DUCTS: Gallbladder and bile ducts are normal. SPLEEN: The spleen is enlarged without focal lesion. PANCREAS: The pancreas is normal. ADRENAL GLANDS: The adrenal glands are normal. KIDNEYS, URETERS AND BLADDER: Mild hydronephrosis and hydroureter on the left with stranding and edema around the left kidney. No ureteral stones are demonstrated. This could be due to recently passed stone, extrinsic compression or reflux. The bladder is diffusely distended without wall thickening or intraluminal filling defects. No bladder stones. GI AND BOWEL: Stomach, small bowel, and colon are not abnormally distended. No wall thickening or inflammatory stranding are appreciated. The appendix is surgically absent. PERITONEUM AND RETROPERITONEUM: No ascites. No free air. VASCULATURE: Aorta is normal in caliber. LYMPH NODES: No lymphadenopathy. REPRODUCTIVE ORGANS: The uterus is surgically absent. No abnormal adnexal masses. BONES AND SOFT TISSUES: Diffuse heterogeneous sclerosis throughout the visualized skeleton consistent with diffuse bone metastasis. No focal soft tissue abnormality. IMPRESSION: 1. Liver metastases on a background of cirrhosis, overall grossly similar to prior, without definite interval progression identified on this noncontrast exam. The spleen is enlarged. 2. Mild left hydronephrosis and hydroureter with perinephric stranding and edema, which may reflect extrinsic compression or reflux; no ureteral calculi identified. 3.  Diffuse osseous metastatic disease with heterogeneous sclerosis throughout the visualized skeleton. Electronically signed by: Elsie Gravely MD 12/29/2023 01:05 PM EST RP Workstation: [id number]   CT Head Wo Contrast Result Date: 12/28/2023 EXAM: CT HEAD WITHOUT CONTRAST 12/28/2023 08:43:23 PM TECHNIQUE: CT of the head was performed without the  administration of intravenous contrast. Automated exposure control, iterative reconstruction, and/or weight based adjustment of the mA/kV was utilized to reduce the radiation dose to as low as reasonably achievable. COMPARISON: 05/03/2011 CLINICAL HISTORY: Seizure, new-onset, no history of trauma. FINDINGS: BRAIN AND VENTRICLES: No acute hemorrhage. No evidence of acute infarct. No extra-axial collection. No mass effect or midline shift. ORBITS: No acute abnormality. SINUSES: No acute abnormality. SOFT TISSUES AND SKULL: No acute soft tissue abnormality. No skull fracture. IMPRESSION: 1. No acute intracranial abnormality. Electronically signed by: Oneil Devonshire MD 12/28/2023 08:51 PM EST RP Workstation: [id number]      Subjective: Denies any complaints this morning, eager to go home,  Discussed discharge plan with patient and husband  Discharge Exam: Vitals:   01/01/24 0920 01/01/24 1357  BP: (!) 146/91 138/79  Pulse: 86 (!) 102  Resp: 16 16  Temp: 98.8 F (37.1 C) 98.2 F (36.8 C)  SpO2: 96% 95%   Vitals:   12/31/23 2356 01/01/24 0425 01/01/24 0920 01/01/24 1357  BP: 115/80 (!) 153/74 (!) 146/91 138/79  Pulse: 88 80 86 (!) 102  Resp: 18 18 16 16   Temp: 97.7 F (36.5 C) 97.7 F (36.5 C) 98.8 F (37.1 C) 98.2 F (36.8 C)  TempSrc: Oral Oral Oral Oral  SpO2: 92% 90% 96% 95%  Weight:      Height:        General: Pt is alert, awake, not in acute distress Cardiovascular: RRR, S1/S2 +, no rubs, no gallops Respiratory: CTA bilaterally, no wheezing, no rhonchi Abdominal: Soft, NT, ND, bowel sounds + Extremities: no edema, no cyanosis    The results of significant diagnostics from this hospitalization (including imaging, microbiology, ancillary and laboratory) are listed below for reference.     Microbiology: No results found for this or any previous visit (from the past 240 hours).   Labs: BNP (last 3 results) No results for input(s): BNP in the last 8760 hours. Basic  Metabolic Panel: Recent Labs  Lab 12/28/23 2023 12/28/23 2057 12/30/23 0556 12/31/23 0247 01/01/24 0021  NA 140 141 146* 144 140  K 3.2* 3.2* 3.5 2.9* 3.7  CL 103 104 109 107 107  CO2 20*  --  24 24 21*  GLUCOSE 148* 149* 86 144* 149*  BUN 8 7 10 7 7   CREATININE 0.56 0.60 0.91 0.78 0.56  CALCIUM 9.2  --  8.0* 8.1* 8.3*  MG 1.9  --  2.0 2.0 1.9  PHOS  --   --  5.1*  --   --    Liver Function Tests: Recent Labs  Lab 12/28/23 2023 12/30/23 0556 12/31/23 0247 01/01/24 0021  AST 32 19 15 16   ALT 21 17 16 13   ALKPHOS 258* 165* 150* 142*  BILITOT <0.2 0.7 0.7 0.5  PROT 6.3* 5.7* 5.5* 5.7*  ALBUMIN 3.9 2.7* 2.7* 2.7*   No results for input(s): LIPASE, AMYLASE in the last 168 hours. No results for input(s): AMMONIA in the last 168 hours. CBC: Recent Labs  Lab 12/28/23 2023 12/28/23 2057 12/29/23 0703 12/30/23 0556 12/31/23 0247 01/01/24 0021  WBC 11.6*  --  15.3* 9.9 11.1* 10.3  NEUTROABS 8.6*  --  13.8* 8.0* 8.6* 9.3*  HGB 11.1* 11.6* 11.5* 10.9* 10.5* 10.2*  HCT 35.3* 34.0* 35.7* 35.2* 32.8* 32.5*  MCV 99.4  --  96.5 98.6 97.9 98.8  PLT 231  --  244 244 251 224   Cardiac Enzymes: No results for input(s): CKTOTAL, CKMB, CKMBINDEX, TROPONINI in the last 168 hours. BNP: Invalid input(s): POCBNP CBG: Recent Labs  Lab 12/28/23 2002 12/28/23 2031  GLUCAP 151* 162*   D-Dimer No results for input(s): DDIMER in the last 72 hours. Hgb A1c No results for input(s): HGBA1C in the last 72 hours. Lipid Profile No results for input(s): CHOL, HDL, LDLCALC, TRIG, CHOLHDL, LDLDIRECT in the last 72 hours. Thyroid function studies No results for input(s): TSH, T4TOTAL, T3FREE, THYROIDAB in the last 72 hours.  Invalid input(s): FREET3 Anemia work up No results for input(s): VITAMINB12, FOLATE, FERRITIN, TIBC, IRON , RETICCTPCT in the last 72 hours. Urinalysis    Component Value Date/Time   COLORURINE AMBER (A)  12/29/2023 0703   APPEARANCEUR CLOUDY (A) 12/29/2023 0703   LABSPEC 1.005 12/29/2023 0703   PHURINE 7.0 12/29/2023 0703   GLUCOSEU NEGATIVE 12/29/2023 0703   HGBUR MODERATE (A) 12/29/2023 0703   BILIRUBINUR NEGATIVE 12/29/2023 0703   BILIRUBINUR negative 12/25/2021 1610   KETONESUR NEGATIVE 12/29/2023 0703   PROTEINUR 100 (A) 12/29/2023 0703   UROBILINOGEN 1.0 12/25/2021 1610   UROBILINOGEN 0.2 12/08/2014 1700   NITRITE NEGATIVE 12/29/2023 0703   LEUKOCYTESUR NEGATIVE 12/29/2023 0703   Sepsis Labs Recent Labs  Lab 12/29/23 0703 12/30/23 0556 12/31/23 0247 01/01/24 0021  WBC 15.3* 9.9 11.1* 10.3   Microbiology No results found for this or any previous visit (from the past 240 hours).   Time coordinating discharge: Over 30 minutes  SIGNED:   Brayton Lye, MD  [facility] 01/01/2024, 2:40 PM Pager   If 7PM-7AM, please contact night-coverage [email or web address]

## 2024-01-01 NOTE — Progress Notes (Signed)
 I spoke with the patient's husband to let him know that while the MRI did show disease in the spine, it did not indicate concerns for leptomeningeal disease. We will plan then to proceed with treatment to the brain. Dr. Patrcia may also consider treatment to her breast after we begin treatment urgently to the brain.

## 2024-01-01 NOTE — Progress Notes (Signed)
 Discharge medications delivered to patient at the bedside.

## 2024-01-01 NOTE — Discharge Instructions (Signed)
 Follow with Primary MD [name], MD in 7 days   Get CBC, CMP,checked  by Primary MD next visit.   Do not drive, operating heavy machinery, perform activities at heights, swimming or participation in water activities or provide baby sitting services due to siezures until you have seen by Primary MD or a Neurologist and advised to do so again.    Please note  You were cared for by a hospitalist during your hospital stay. If you have any questions about your discharge medications or the care you received while you were in the hospital after you are discharged, you can call the unit and asked to speak with the hospitalist on call if the hospitalist that took care of you is not available. Once you are discharged, your primary care physician will handle any further medical issues. Please note that NO REFILLS for any discharge medications will be authorized once you are discharged, as it is imperative that you return to your primary care physician (or establish a relationship with a primary care physician if you do not have one) for your aftercare needs so that they can reassess your need for medications and monitor your lab values.

## 2024-01-01 NOTE — Plan of Care (Signed)

## 2024-01-01 NOTE — Progress Notes (Signed)
 PT Cancellation Note  Patient Details Name: Monica Soto MRN: [id number] DOB: Jun 29, 1965   Cancelled Treatment:    Reason Eval/Treat Not Completed: Other (comment) (Pt transferred to [facility]   Arlesia Kiel 01/01/2024, 9:30 AM

## 2024-01-01 NOTE — Plan of Care (Addendum)
 Patient and husband at the bedside. AVS explained, chest port de-accessed, husband to transport patient home. Patient awaiting meds to beds and tylenol  printed script in discharge packet. Wound care is managed by pt and family at home, [facility] health to continue follow up at home. All questions answered, no further needs atm.  Problem: Safety: Goal: Non-violent Restraint(s) Outcome: Adequate for Discharge   Problem: Education: Goal: Knowledge of General Education information will improve Description: Including pain rating scale, medication(s)/side effects and non-pharmacologic comfort measures Outcome: Adequate for Discharge   Problem: Health Behavior/Discharge Planning: Goal: Ability to manage health-related needs will improve Outcome: Adequate for Discharge   Problem: Clinical Measurements: Goal: Ability to maintain clinical measurements within normal limits will improve Outcome: Adequate for Discharge Goal: Will remain free from infection Outcome: Adequate for Discharge Goal: Diagnostic test results will improve Outcome: Adequate for Discharge Goal: Respiratory complications will improve Outcome: Adequate for Discharge Goal: Cardiovascular complication will be avoided Outcome: Adequate for Discharge   Problem: Activity: Goal: Risk for activity intolerance will decrease Outcome: Adequate for Discharge   Problem: Nutrition: Goal: Adequate nutrition will be maintained Outcome: Adequate for Discharge   Problem: Coping: Goal: Level of anxiety will decrease Outcome: Adequate for Discharge   Problem: Elimination: Goal: Will not experience complications related to bowel motility Outcome: Adequate for Discharge Goal: Will not experience complications related to urinary retention Outcome: Adequate for Discharge   Problem: Pain Managment: Goal: General experience of comfort will improve and/or be controlled Outcome: Adequate for Discharge   Problem: Safety: Goal: Ability  to remain free from injury will improve Outcome: Adequate for Discharge   Problem: Skin Integrity: Goal: Risk for impaired skin integrity will decrease Outcome: Adequate for Discharge

## 2024-01-01 NOTE — Progress Notes (Signed)
  Radiation Oncology         [phone] ________________________________  Name: ROSAMAE ROCQUE MRN: [id number]  Date: 12/28/2023  DOB: August 15, 1965  SIMULATION AND TREATMENT PLANNING NOTE    ICD-10-CM   1. Seizure-like activity (HCC)  R56.9     2. Metastatic malignant neoplasm, unspecified site (HCC)  C79.9 LORazepam  (ATIVAN ) injection 1 mg    DISCONTINUED: dexamethasone  (DECADRON ) tablet 4 mg    3. Hypokalemia  E87.6     4. 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  Identity was confirmed.  All relevant records and images related to the planned course of therapy were reviewed.  The patient freely provided informed written consent to proceed with treatment after reviewing the details related to the planned course of therapy. The consent form was witnessed and verified by the simulation staff.  Then, the patient was set-up in a stable reproducible  supine position for radiation therapy.  CT images were obtained.  Surface markings were placed.  The CT images were loaded into the planning software.  Then the target and avoidance structures were contoured.  Treatment planning then occurred.  The radiation prescription was entered and confirmed.  Then, I designed and supervised the construction of a total of 3 medically necessary complex treatment device including a custom made thermoplastic mask used for immobilization, and two MLC collimator apertures for radiotherapy from the right and left side, with independent collimation for each to account for beam divergence.  I have requested : Isodose Plan.    PLAN:  The whole brain will be treated to 30 Gy in 10 fractions.  We will initiate treatment today and our on-call team will continue  radiation on Friday and Saturday, and then resume next week.  She does not need to remain hospitalized during her brain radiation if she is stable for discharge.  Our on call team can meet her for treatment as outpatient.  She appears to have excellent family support and may prefer to spend Thanksgiving with her family at home.  ________________________________  Donnice LABOR. Patrcia, M.D.

## 2024-01-02 ENCOUNTER — Other Ambulatory Visit: Payer: Self-pay

## 2024-01-03 ENCOUNTER — Other Ambulatory Visit (HOSPITAL_COMMUNITY): Payer: Self-pay

## 2024-01-03 ENCOUNTER — Other Ambulatory Visit: Payer: Self-pay

## 2024-01-03 ENCOUNTER — Ambulatory Visit
Admission: RE | Admit: 2024-01-03 | Discharge: 2024-01-03 | Disposition: A | Discharge status: Home / Self Care | Source: Ambulatory Visit | Attending: Radiation Oncology | Admitting: Radiation Oncology

## 2024-01-03 LAB — RAD ONC ARIA SESSION SUMMARY
Course Elapsed Days: 2
Plan Fractions Treated to Date: 2
Plan Prescribed Dose Per Fraction: 3 Gy
Plan Total Fractions Prescribed: 10
Plan Total Prescribed Dose: 30 Gy
Reference Point Dosage Given to Date: 6 Gy
Reference Point Session Dosage Given: 3 Gy
Session Number: 2

## 2024-01-04 ENCOUNTER — Other Ambulatory Visit: Payer: Self-pay

## 2024-01-04 ENCOUNTER — Ambulatory Visit
Admission: RE | Admit: 2024-01-04 | Discharge: 2024-01-04 | Disposition: A | Discharge status: Home / Self Care | Source: Ambulatory Visit | Attending: Radiation Oncology | Admitting: Radiation Oncology

## 2024-01-04 LAB — RAD ONC ARIA SESSION SUMMARY
Course Elapsed Days: 3
Plan Fractions Treated to Date: 3
Plan Prescribed Dose Per Fraction: 3 Gy
Plan Total Fractions Prescribed: 10
Plan Total Prescribed Dose: 30 Gy
Reference Point Dosage Given to Date: 9 Gy
Reference Point Session Dosage Given: 3 Gy
Session Number: 3

## 2024-01-06 ENCOUNTER — Encounter: Payer: Self-pay | Admitting: Radiation Oncology

## 2024-01-06 ENCOUNTER — Ambulatory Visit
Admission: RE | Admit: 2024-01-06 | Discharge: 2024-01-06 | Disposition: A | Discharge status: Home / Self Care | Source: Ambulatory Visit | Attending: Radiation Oncology | Admitting: Radiation Oncology

## 2024-01-06 ENCOUNTER — Other Ambulatory Visit: Payer: Self-pay

## 2024-01-06 DIAGNOSIS — Z510 Encounter for antineoplastic radiation therapy: Secondary | ICD-10-CM | POA: Diagnosis present

## 2024-01-06 DIAGNOSIS — C7931 Secondary malignant neoplasm of brain: Secondary | ICD-10-CM | POA: Insufficient documentation

## 2024-01-06 LAB — RAD ONC ARIA SESSION SUMMARY
Course Elapsed Days: 5
Plan Fractions Treated to Date: 4
Plan Prescribed Dose Per Fraction: 3 Gy
Plan Total Fractions Prescribed: 10
Plan Total Prescribed Dose: 30 Gy
Reference Point Dosage Given to Date: 12 Gy
Reference Point Session Dosage Given: 3 Gy
Session Number: 4

## 2024-01-06 NOTE — Progress Notes (Signed)
  Radiation Oncology         [phone] ________________________________  Name: Monica Soto  FMW:[id number]  Date of Service: 01/06/24  DOB: Jul 25, 1965   Steroid Taper Instructions   You currently have a prescription for Dexamethasone  4 mg Tablets.   Beginning 01/10/24  Take a 4 mg tablet twice a day  Beginning 01/17/24: Take 1/2 of a tablet (which is 2 mg) twice a day  Beginning 01/24/24: Take 1/2 of a tablet (which is 2 mg) once a day  Beginning 01/28/24: Take 1/2 of a tablet (which is 2 mg) every other day and stop on 02/03/24.   Please call our office if you have any headaches, visual changes, uncontrolled movements, extremity weakness, nausea or vomiting.

## 2024-01-07 ENCOUNTER — Inpatient Hospital Stay

## 2024-01-07 ENCOUNTER — Ambulatory Visit
Admission: RE | Admit: 2024-01-07 | Discharge: 2024-01-07 | Disposition: A | Source: Ambulatory Visit | Attending: Radiation Oncology

## 2024-01-07 ENCOUNTER — Inpatient Hospital Stay: Admitting: Oncology

## 2024-01-07 ENCOUNTER — Other Ambulatory Visit: Payer: Self-pay

## 2024-01-07 DIAGNOSIS — C7931 Secondary malignant neoplasm of brain: Secondary | ICD-10-CM | POA: Diagnosis not present

## 2024-01-07 LAB — RAD ONC ARIA SESSION SUMMARY
Course Elapsed Days: 6
Plan Fractions Treated to Date: 5
Plan Prescribed Dose Per Fraction: 3 Gy
Plan Total Fractions Prescribed: 10
Plan Total Prescribed Dose: 30 Gy
Reference Point Dosage Given to Date: 15 Gy
Reference Point Session Dosage Given: 3 Gy
Session Number: 5

## 2024-01-08 ENCOUNTER — Other Ambulatory Visit: Payer: Self-pay

## 2024-01-08 ENCOUNTER — Ambulatory Visit
Admission: RE | Admit: 2024-01-08 | Discharge: 2024-01-08 | Disposition: A | Source: Ambulatory Visit | Attending: Radiation Oncology

## 2024-01-08 DIAGNOSIS — C7931 Secondary malignant neoplasm of brain: Secondary | ICD-10-CM | POA: Diagnosis not present

## 2024-01-08 LAB — RAD ONC ARIA SESSION SUMMARY
Course Elapsed Days: 7
Plan Fractions Treated to Date: 6
Plan Prescribed Dose Per Fraction: 3 Gy
Plan Total Fractions Prescribed: 10
Plan Total Prescribed Dose: 30 Gy
Reference Point Dosage Given to Date: 18 Gy
Reference Point Session Dosage Given: 3 Gy
Session Number: 6

## 2024-01-09 ENCOUNTER — Other Ambulatory Visit: Payer: Self-pay

## 2024-01-09 ENCOUNTER — Ambulatory Visit
Admission: RE | Admit: 2024-01-09 | Discharge: 2024-01-09 | Disposition: A | Source: Ambulatory Visit | Attending: Radiation Oncology

## 2024-01-09 DIAGNOSIS — C7931 Secondary malignant neoplasm of brain: Secondary | ICD-10-CM | POA: Diagnosis not present

## 2024-01-09 LAB — RAD ONC ARIA SESSION SUMMARY
Course Elapsed Days: 8
Plan Fractions Treated to Date: 7
Plan Prescribed Dose Per Fraction: 3 Gy
Plan Total Fractions Prescribed: 10
Plan Total Prescribed Dose: 30 Gy
Reference Point Dosage Given to Date: 21 Gy
Reference Point Session Dosage Given: 3 Gy
Session Number: 7

## 2024-01-10 ENCOUNTER — Ambulatory Visit
Admission: RE | Admit: 2024-01-10 | Discharge: 2024-01-10 | Disposition: A | Source: Ambulatory Visit | Attending: Radiation Oncology

## 2024-01-10 ENCOUNTER — Ambulatory Visit

## 2024-01-10 ENCOUNTER — Other Ambulatory Visit: Payer: Self-pay

## 2024-01-10 DIAGNOSIS — C7931 Secondary malignant neoplasm of brain: Secondary | ICD-10-CM | POA: Diagnosis not present

## 2024-01-10 LAB — RAD ONC ARIA SESSION SUMMARY
Course Elapsed Days: 9
Plan Fractions Treated to Date: 8
Plan Prescribed Dose Per Fraction: 3 Gy
Plan Total Fractions Prescribed: 10
Plan Total Prescribed Dose: 30 Gy
Reference Point Dosage Given to Date: 24 Gy
Reference Point Session Dosage Given: 3 Gy
Session Number: 8

## 2024-01-13 ENCOUNTER — Ambulatory Visit
Admission: RE | Admit: 2024-01-13 | Discharge: 2024-01-13 | Disposition: A | Source: Ambulatory Visit | Attending: Radiation Oncology

## 2024-01-13 ENCOUNTER — Other Ambulatory Visit: Payer: Self-pay

## 2024-01-13 DIAGNOSIS — C7931 Secondary malignant neoplasm of brain: Secondary | ICD-10-CM | POA: Diagnosis not present

## 2024-01-13 LAB — RAD ONC ARIA SESSION SUMMARY
Course Elapsed Days: 12
Plan Fractions Treated to Date: 9
Plan Prescribed Dose Per Fraction: 3 Gy
Plan Total Fractions Prescribed: 10
Plan Total Prescribed Dose: 30 Gy
Reference Point Dosage Given to Date: 27 Gy
Reference Point Session Dosage Given: 3 Gy
Session Number: 9

## 2024-01-14 ENCOUNTER — Ambulatory Visit
Admission: RE | Admit: 2024-01-14 | Discharge: 2024-01-14 | Disposition: A | Source: Ambulatory Visit | Attending: Radiation Oncology

## 2024-01-14 ENCOUNTER — Inpatient Hospital Stay

## 2024-01-14 ENCOUNTER — Ambulatory Visit
Admission: RE | Admit: 2024-01-14 | Discharge: 2024-01-14 | Disposition: A | Discharge status: Home / Self Care | Source: Ambulatory Visit | Attending: Radiation Oncology | Admitting: Radiation Oncology

## 2024-01-14 ENCOUNTER — Other Ambulatory Visit: Payer: Self-pay

## 2024-01-14 ENCOUNTER — Other Ambulatory Visit: Payer: Self-pay | Admitting: *Deleted

## 2024-01-14 DIAGNOSIS — C7931 Secondary malignant neoplasm of brain: Secondary | ICD-10-CM | POA: Diagnosis not present

## 2024-01-14 LAB — RAD ONC ARIA SESSION SUMMARY
Course Elapsed Days: 13
Plan Fractions Treated to Date: 10
Plan Prescribed Dose Per Fraction: 3 Gy
Plan Total Fractions Prescribed: 10
Plan Total Prescribed Dose: 30 Gy
Reference Point Dosage Given to Date: 30 Gy
Reference Point Session Dosage Given: 3 Gy
Session Number: 10

## 2024-01-15 ENCOUNTER — Ambulatory Visit

## 2024-01-15 ENCOUNTER — Inpatient Hospital Stay

## 2024-01-15 NOTE — Radiation Completion Notes (Signed)
 Patient Name: Monica Soto, Monica Soto MRN: [id number] Date of Birth: 11-29-1965 Referring Physician: ONITA MATTOCK, M.D. Date of Service: 2024-01-15 Radiation Oncologist: Adina Barge, M.D. [facility]                             RADIATION ONCOLOGY END OF TREATMENT NOTE     Diagnosis: C79.31 Secondary malignant neoplasm of brain Staging on 2022-07-04: Malignant neoplasm of upper-outer quadrant of right breast in female, estrogen receptor positive (HCC) T=cT4, N=cN2, M=cM1 Intent: Palliative     ==========DELIVERED PLANS==========  First Treatment Date: 2024-01-01 Last Treatment Date: 2024-01-14   Plan Name: Brain_Whole Site: Brain Technique: 3D Mode: Photon Dose Per Fraction: 3 Gy Prescribed Dose (Delivered / Prescribed): 30 Gy / 30 Gy Prescribed Fxs (Delivered / Prescribed): 10 / 10     ==========ON TREATMENT VISIT DATES========== 2024-01-08, 2024-01-14     ==========UPCOMING VISITS========== 02/11/2024 [facility] POST TREATMENT CALL [facility]  02/05/2024 [facility] INJECTION [facility] NURSE  02/04/2024 [facility] INFUSION 4HR (240) AP-AC[facility]A CHAIR 1  02/04/2024 [facility] PORT FLUSH W/LAB AP-AC[facility]A NURSE  01/28/2024 [facility] INFUSION 4HR (240) AP-ACAPA CHAIR 1  01/28/2024 [facility] OFFICE VISIT 20 [name], MD  01/28/2024 [facility] PORT FLUSH W/LAB AP-ACAPA NURSE  01/20/2024 [facility] OFFICE VISIT 20 [name], MD  01/20/2024 [facility] PORT FLUSH W/LAB AP-ACAPA NURSE        ==========APPENDIX - ON TREATMENT VISIT NOTES==========   See weekly On Treatment Notes in Epic for details in the Media tab (listed as Progress notes on the On Treatment Visit Dates listed above).

## 2024-01-16 ENCOUNTER — Ambulatory Visit

## 2024-01-17 ENCOUNTER — Other Ambulatory Visit: Payer: Self-pay

## 2024-01-19 NOTE — Progress Notes (Unsigned)
 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   Liver biopsy at this time showed low ER positive, PR negative and HER2 negative disease. Started on carboplatin  and Abraxane  and added pembrolizumab  and PD-L1 NGS came back as 10. Progressed on PET scan from 05/2023 and patient was started on Sacituzumab govitecan  on 06/10/2023    - We reviewed the latest PET scan together.  Patient had good response in all sites including liver and multiple multiple lesions. -Continuing sacituzumab govitecan  cycle 9-day 1 today. - Labs reviewed today: CMP: Creatinine: Normal, alkaline phosphatase slightly elevated from prior.  Normal LFTs.  CBC: WBC: 12.4, hemoglobin: 11.2, platelets: 150. -Recent CA 15-3: 325, CA 27.29: 421.3 - Was seen at [facility] for second opinion and possible clinical trial evaluation.  Seen by Dr. Dominick.  She is being tested for a possible phase 2 clinical trial at progression.  Recommended continuing Sacituzumab govitecan  at this time. - Will repeat PET  scan in 3 months I.e 02/2024 - Physical exam stable today.  Continue with treatment today.   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 Current pain management with ibuprofen  and Tylenol   -Will prescribe oxycodone  5 mg, twice a day, with the option to take once a day before bed.  Normocytic anemia Anemia is improving with a current hemoglobin level of 11.4, which is higher than previous levels.  IV iron  supplementation and dietary changes are contributing to this improvement.  - Continue iron  supplementation every other day. - Encourage dietary intake to support hemoglobin levels.  Metastasis to bone Patient has multiple bony metastasis that are showing mixed response at this time Currently on Zometa  4 mg every 3 months   -Continue Zometa  4 mg IV every 3 months -Continue calcium and vitamin D supplementation  Cough Patient reported cough when she lays down to sleep and reports a lot of postnasal discharge Patient reported improvement.   - Continue Flonase , cetirizine  and pantoprazole  as prescribed  Peripheral neuropathy Peripheral neuropathy is well-managed with no new or worsening symptoms.  Wound care for breast wound The wound is improving, with reduced symptoms. It is kept covered with a bandage to prevent oozing, and occasional itching is likely due to secretions.  - Continue current wound care regimen.   The patient understands the plans discussed today and is in agreement with them.  She knows to contact our office if she develops concerns prior to her next appointment.  The total time spent in the appointment was 30 minutes for the encounter with patient,  including review of chart and various tests results, discussions about plan of care and coordination of care plan   Mickiel Dry, MD  [facility] [place] - A DEPT OF  [facility] [address] [place] [address] Dept: [phone] Dept Fax: [phone]   No orders of the defined types were placed in this encounter.    ONCOLOGY HISTORY:   I have reviewed her chart and materials related to her cancer extensively and collaborated history with the patient. 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 There are 2 abnormal right axillary lymph nodes, and a third borderline lymph node. Skin thickening is noted along the medial aspect of the left breast, which may be an extension of the marked skin thickening diffusely on the right. -06/22/2022: Right breast biopsy. Pathology: Invasive moderately differentiated ductal adenocarcinoma, grade 2. ER positive (90%), PR positive (70%), Her2 negative (+1). Ki-67: 20%. 2/2 Right axillary lymph nodes positive for metastatic adenocarcinoma.  -07/12/2022: Initial PET: Intensely hypermetabolic RIGHT breast mass consistent with primary breast carcinoma. Hypermetabolic metastatic RIGHT axillary lymph nodes. Hypermetabolic metastatic lymph node in the posterior triangle of the RIGHT neck. Hypermetabolic LEFT axillary lymph nodes. No evidence of metastatic adenopathy in the mediastinum or abdomen pelvis. No evidence of visceral metastasis. Multiple intensely hypermetabolic skeletal metastasis. -07/04/2022: [facility]:Germline testing : Negative. 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 Abnormal enhancement extends posteriorly to broadly and deeply involve the right chest wall including the pectoralis and underlying intercostal muscles with a small area of abnormal  enhancement extending to the underlying pleura. Enhancement extends medially to the right internal mammary chain. 1.8 cm ill-defined enhancing mass in the lateral left breast suspicious for additional malignancy. Left medial breast skin thickening and underlying trabecular thickening with associated abnormal enhancement suspicious for contralateral extension of inflammatory breast carcinoma. Prominent subcentimeter left axillary lymph nodes.  -07/16/2022: Port placement -07/25/2022-12/13/2022: Anastrozole  and ribociclib  treatment, discontinued due to progression 07/27/2022- Current: Zometa  IV 4 mg every 3 months. -08/08/2022: CA 15-3: 201.0. CA 27.29: 337.7. -11/15/2022: PET:  New hypermetabolic skeletal metastasis.  New hypermetabolic liver metastasis. New hypermetabolic periportal lymph nodes. New hypermetabolic cutaneous tissue in the RIGHT breast. New hypermetabolic glandular tissue in the LEFT breast. Persistent hypermetabolic tissue in the RIGHT breast with extension to the skin surface. -12/01/2022: [employer]: APC G1312, TP53, MYC amplification, FGFR2 amplification, EGFR amplification  -12/07/2022: Liver biopsy. Pathology: Metastatic poorly differentiated adenocarcinoma consistent with breast primary.  ER: Positive, 2%, PR: Negative, 0%, HER2: Negative(1+) -12/17/2022:  PD-L1 (22 C3): 0%  -12/21/2022 -05/30/2023: Abraxane  125mg /m2 with carboplatin  2 AUC added on with cycle 2 on 01/10/2023, discontinued due to progression -01/02/2023: [facility] NGS: MS-stable, TMB-low, PTEN pathogenic variant, genomic LOH high (18%), HER2 (1+), PD-L1 (22 C3) CPS-10.  -03/14/2023-05/16/2023:  Added Pembrolizumab  200mg  every 3 weeks -05/30/2023: PET: Substantially worsened burden of metastatic disease to the liver including new and enlarged lesions. Substantially worsened burden of metastatic disease to the skeleton including new and enlarged lesions. New and increased hypermetabolic nodularity along the right breast  cutaneous surface, compatible with cutaneous metastatic disease. Mildly accentuated metabolic activity in a right hilar lymph node, maximum SUV 3.3 and formerly 2.8. -  06/10/2023- current: Sacituzumab govitecan  started with 1 dose of 10mg /kg and currently on 7.5mg /kg every 3 weeks -08/29/2023: PET: Marked interval improvement in RIGHT breast carcinoma. Marked interval improvement in hepatic metastasis. Mixed response in skeletal metastasis. Several lesions are improved compared to prior where several lesions are new. Persistent widespread skeletal metastasis. -10/09/2023: Seen at [facility] by Francetta Sayer. Recommended continuing Sacituzumab govitecan  at this time but also recommended Enhertu at progression.  She has been tested for nectin for amplification to be considered for a clinical trial. - 11/21/2023: PET scan: Bilateral hepatic metastases are relatively similar overall. The inferior right hepatic lobe lesion demonstrates decreased hypermetabolism with similar size. A pericholecystic right hepatic lobe lesion is newly hypermetabolic. The dominant central left hepatic lobe lesion is relatively similar. Osseous metastases are relatively similar overall. A scapular index lesion demonstrates increased activity today. Right breast hypermetabolism is mildly increased.  Current Treatment:  Sacituzumab govitecan  7.5mg /kg every 3 weeks  INTERVAL HISTORY:   Discussed the use of AI scribe software for clinical note transcription with the patient, who gave verbal consent to proceed.  History of Present Illness Monica Soto is a 58 year old female with metastatic breast cancer who presents for follow-up after recent PET scan results.  She is undergoing treatment for metastatic breast cancer with Trodelvy , administered every three weeks. The scan shows that previously bright areas in the bones have become less bright, and some spots are no longer visible. Additionally, liver spots have reduced in size.  She  experiences shoulder pain, which varies in intensity and is worse at night. She manages the pain with ibuprofen  and Tylenol , alternating every four hours.  She has a history of tingling in her toes, which has not worsened. Her cough is well-managed with cetirizine , pantoprazole , and Flonase , although she admits to inconsistent use of Flonase .  Her wound is improving, and she keeps it covered with a bandage to prevent oozing. Occasionally, it itches, which she attributes to secretions.  She is receiving Zometa  every three months for bone metastasis and takes calcium and vitamin D supplements. Her anemia has improved, with a hemoglobin level of 11.2, and she is trying to eat more, attributing improvement to iron  supplementation.   I have reviewed the past medical history, past surgical history, social history and family history with the patient and they are unchanged from previous note.  ALLERGIES:  is allergic to keppra  [levetiracetam ].  MEDICATIONS:  Current Outpatient Medications  Medication Sig Dispense Refill   acetaminophen  (TYLENOL ) 500 MG tablet Take 1 tablet (500 mg total) by mouth every 6 (six) hours as needed for mild pain (pain score 1-3), fever or headache. 30 tablet 0   Camphor-Menthol-Methyl Sal (SALONPAS EX) Apply 1 Application topically daily as needed.     dexamethasone  (DECADRON ) 4 MG tablet Take 1 tablet (4 mg total) by mouth 4 (four) times daily. 120 tablet 1   diphenoxylate -atropine  (LOMOTIL ) 2.5-0.025 MG tablet Take 2 tablets after first loose stool and 1 after each loose stool thereafter.  Do not exceed 8 tablets in 1 day. 30 tablet 3   divalproex  (DEPAKOTE ) 500 MG DR tablet Take 1 tablet (500 mg total) by mouth every 12 (twelve) hours. 60 tablet 0   fluticasone  (FLONASE ) 50 MCG/ACT nasal spray Place 1 spray into both nostrils daily. (Patient taking differently: Place 1 spray into both nostrils daily as needed for rhinitis.) 18.2 mL 2   furosemide  (LASIX ) 20 MG tablet TAKE  1 TABLET(20 MG) BY MOUTH DAILY AS NEEDED FOR  SWELLING 30 tablet 2   lidocaine -prilocaine  (EMLA ) cream APPLY TOPICALLY TO THE AFFECTED AREA 1 TIME 30 g 3   LORazepam  (ATIVAN ) 0.5 MG tablet Take 1 tab by mouth 1 hour prior to radiation 10 tablet 0   magnesium  oxide (MAG-OX) 400 (240 Mg) MG tablet Take 1 tablet (400 mg total) by mouth 2 (two) times daily. 60 tablet 3   nicotine  (NICODERM CQ  - DOSED IN MG/24 HOURS) 14 mg/24hr patch Place 1 patch (14 mg total) onto the skin daily.     ondansetron  (ZOFRAN ) 8 MG tablet TAKE 1 TABLET(8 MG) BY MOUTH EVERY 8 HOURS AS NEEDED FOR NAUSEA OR VOMITING. START THIRD DAY AFTER DOXORUBICIN/ CYCLOPHOSPHAMIDE CHEMOTHERAPY 30 tablet 2   oxyCODONE  (OXY IR/ROXICODONE ) 5 MG immediate release tablet Take 1 tablet (5 mg total) by mouth 2 (two) times daily as needed for severe pain (pain score 7-10). 60 tablet 0   pantoprazole  (PROTONIX ) 40 MG tablet Take 1 tablet (40 mg total) by mouth daily. 30 tablet 2   polyethylene glycol (MIRALAX  / GLYCOLAX ) 17 g packet Take 17 g by mouth daily as needed for mild constipation.     prochlorperazine  (COMPAZINE ) 10 MG tablet Take 1 tablet (10 mg total) by mouth every 6 (six) hours as needed for nausea or vomiting. 30 tablet 2   No current facility-administered medications for this visit.   Facility-Administered Medications Ordered in Other Visits  Medication Dose Route Frequency Provider Last Rate Last Admin   0.9 %  sodium chloride  infusion   Intravenous Continuous Edie Vallandingham, MD   Stopped at 09/24/23 1259    REVIEW OF SYSTEMS:   Constitutional: Positive for fatigue, Denies fevers, chills or abnormal weight loss Eyes: Denies blurriness of vision Ears, nose, mouth, throat, and face: Denies mucositis or sore throat Respiratory: Positive for cough, Denies dyspnea or wheezes Cardiovascular: Denies palpitation, chest discomfort or lower extremity swelling Gastrointestinal: Denies nausea, vomiting, diarrhea, heartburn or change in  bowel habits Skin: Denies abnormal skin rashes Lymphatics: Denies new lymphadenopathy or easy bruising Neurological: Positive for numbness in toes, Denies tingling or new weaknesses Behavioral/Psych: Mood is stable, no new changes  All other systems were reviewed with the patient and are negative.   VITALS:  There were no vitals taken for this visit.  Wt Readings from Last 3 Encounters:  12/29/23 156 lb 1.4 oz (70.8 kg)  12/17/23 156 lb 1.4 oz (70.8 kg)  12/03/23 153 lb 9.6 oz (69.7 kg)    There is no height or weight on file to calculate BMI.  Performance status (ECOG): 1 - Symptomatic but completely ambulatory  PHYSICAL EXAM:   GENERAL:alert, no distress and comfortable SKIN: skin color, texture, turgor are normal, no rashes or significant lesions LYMPH:  no palpable lymphadenopathy in the cervical, axillary or inguinal LUNGS: clear to auscultation and percussion with normal breathing effort HEART: regular rate & rhythm and no murmurs and no lower extremity edema ABDOMEN:abdomen soft, non-tender and normal bowel sounds Musculoskeletal:no cyanosis of digits and no clubbing  NEURO: alert & oriented x 3 with fluent speech, no focal motor/sensory deficits   LABORATORY DATA:  I have reviewed the data as listed    Component Value Date/Time   NA 140 01/01/2024 0021   K 3.7 01/01/2024 0021   CL 107 01/01/2024 0021   CO2 21 (L) 01/01/2024 0021   GLUCOSE 149 (H) 01/01/2024 0021   BUN 7 01/01/2024 0021   CREATININE 0.56 01/01/2024 0021   CREATININE 0.70 07/17/2022 0945  CALCIUM 8.3 (L) 01/01/2024 0021   PROT 5.7 (L) 01/01/2024 0021   ALBUMIN 2.7 (L) 01/01/2024 0021   AST 16 01/01/2024 0021   AST 11 (L) 07/17/2022 0945   ALT 13 01/01/2024 0021   ALT 14 07/17/2022 0945   ALKPHOS 142 (H) 01/01/2024 0021   BILITOT 0.5 01/01/2024 0021   BILITOT 0.3 07/17/2022 0945   GFRNONAA >60 01/01/2024 0021   GFRNONAA >60 07/17/2022 0945   GFRAA >60 03/29/2015 1616    Lab Results   Component Value Date   WBC 10.3 01/01/2024   NEUTROABS 9.3 (H) 01/01/2024   HGB 10.2 (L) 01/01/2024   HCT 32.5 (L) 01/01/2024   MCV 98.8 01/01/2024   PLT 224 01/01/2024    Latest Reference Range & Units 11/12/23 08:23  CA 15-3 0.0 - 25.0 U/mL 325.0 (H)  CA 27.29 0.0 - 38.6 U/mL 421.3 (H)  (H): Data is abnormally high  RADIOGRAPHIC STUDIES: I have personally reviewed the radiological images as listed and agreed with the findings in the report.  MR TOTAL SPINE METS SCREENING CLINICAL DATA:  58 year old female with history of breast cancer, metastatic disease evaluation.  EXAM: MRI TOTAL SPINE WITHOUT AND WITH CONTRAST  TECHNIQUE: Multisequence MR imaging of the spine from the cervical spine to the sacrum was performed prior to and following IV contrast administration for evaluation of spinal metastatic disease.  CONTRAST:  7mL GADAVIST  GADOBUTROL  1 MMOL/ML IV SOLN  COMPARISON:  Comparison made with prior brain MRI from 12/30/2023 as well as PET-CT from 11/21/2023.  FINDINGS: MRI CERVICAL SPINE FINDINGS  Alignment: Straightening of the normal cervical lordosis. No listhesis.  Vertebrae: Widespread osseous metastases seen diffusely throughout the cervical spine. There is involvement of nearly all levels. No extra osseous or epidural extension of tumor. No pathologic compression fracture or other complicating feature.  Cord: Normal signal and morphology. No abnormal enhancement. No evidence for leptomeningeal metastatic disease within the cervical spinal cord.  Posterior Fossa, vertebral arteries, paraspinal tissues: Unremarkable.  Disc levels:  Minimal noncompressive disc bulging noted at C4-5. No other significant disc pathology or stenosis within the cervical spine.  MRI THORACIC SPINE FINDINGS  Alignment: Mild exaggeration of the normal thoracic kyphosis. No listhesis.  Vertebrae: Diffuse skeletal osseous metastases seen throughout the thoracic spine.  There is involvement of nearly all levels. No extra osseous or epidural extension of tumor. No pathologic compression fracture.  Cord: Normal signal and morphology. No abnormal enhancement. No evidence for leptomeningeal metastatic disease.  Paraspinal and other soft tissues: Unremarkable.  Disc levels:  No significant disc pathology or stenosis within the thoracic spine.  MRI LUMBAR SPINE FINDINGS  Segmentation: Standard. Lowest well-formed disc space labeled the L5-S1 level.  Alignment: Physiologic with preservation of the normal lumbar lordosis. No listhesis.  Vertebrae: Widespread osseous metastatic disease seen diffusely throughout the lumbar spine. Involvement of nearly all levels, with largest lesion measuring 2.2 cm within the L4 vertebral body. No extra osseous or epidural extension of tumor. No pathologic compression fracture or other complicating feature.  Conus medullaris: Extends to the T12-L1 level and appears normal. No abnormal enhancement or evidence for leptomeningeal metastatic disease.  Paraspinal and other soft tissues: Unremarkable.  Disc levels:  Degenerative disc disease with disc bulging and reactive endplate spurring at L5-S1 without significant spinal stenosis.  IMPRESSION: 1. 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 No extra osseous or epidural extension of tumor. No pathologic compression fracture or other complicating features. 2. No evidence for leptomeningeal  metastatic disease within the cervical, thoracic, or lumbar spine. 3. Degenerative disc disease at L5-S1 without significant stenosis.  Electronically Signed   By: Morene Hoard M.D.   On: 01/01/2024 02:18

## 2024-01-20 ENCOUNTER — Inpatient Hospital Stay: Attending: Hematology and Oncology | Admitting: Oncology

## 2024-01-20 ENCOUNTER — Inpatient Hospital Stay: Attending: Hematology and Oncology

## 2024-01-20 VITALS — BP 136/72 | HR 78 | Temp 98.3°F | Resp 16 | Wt 155.8 lb

## 2024-01-20 DIAGNOSIS — S21001S Unspecified open wound of right breast, sequela: Secondary | ICD-10-CM

## 2024-01-20 DIAGNOSIS — J849 Interstitial pulmonary disease, unspecified: Secondary | ICD-10-CM | POA: Insufficient documentation

## 2024-01-20 DIAGNOSIS — Z170 Estrogen receptor positive status [ER+]: Secondary | ICD-10-CM

## 2024-01-20 DIAGNOSIS — T380X5A Adverse effect of glucocorticoids and synthetic analogues, initial encounter: Secondary | ICD-10-CM | POA: Diagnosis not present

## 2024-01-20 DIAGNOSIS — Z1732 Human epidermal growth factor receptor 2 negative status: Secondary | ICD-10-CM | POA: Diagnosis not present

## 2024-01-20 DIAGNOSIS — Z5112 Encounter for antineoplastic immunotherapy: Secondary | ICD-10-CM | POA: Diagnosis present

## 2024-01-20 DIAGNOSIS — F40240 Claustrophobia: Secondary | ICD-10-CM | POA: Insufficient documentation

## 2024-01-20 DIAGNOSIS — G629 Polyneuropathy, unspecified: Secondary | ICD-10-CM | POA: Diagnosis not present

## 2024-01-20 DIAGNOSIS — N6320 Unspecified lump in the left breast, unspecified quadrant: Secondary | ICD-10-CM | POA: Insufficient documentation

## 2024-01-20 DIAGNOSIS — G4700 Insomnia, unspecified: Secondary | ICD-10-CM | POA: Diagnosis not present

## 2024-01-20 DIAGNOSIS — Z923 Personal history of irradiation: Secondary | ICD-10-CM | POA: Insufficient documentation

## 2024-01-20 DIAGNOSIS — G936 Cerebral edema: Secondary | ICD-10-CM | POA: Diagnosis not present

## 2024-01-20 DIAGNOSIS — D649 Anemia, unspecified: Secondary | ICD-10-CM | POA: Diagnosis not present

## 2024-01-20 DIAGNOSIS — Z7983 Long term (current) use of bisphosphonates: Secondary | ICD-10-CM | POA: Insufficient documentation

## 2024-01-20 DIAGNOSIS — R4701 Aphasia: Secondary | ICD-10-CM | POA: Diagnosis not present

## 2024-01-20 DIAGNOSIS — Z7952 Long term (current) use of systemic steroids: Secondary | ICD-10-CM | POA: Diagnosis not present

## 2024-01-20 DIAGNOSIS — C7951 Secondary malignant neoplasm of bone: Secondary | ICD-10-CM

## 2024-01-20 DIAGNOSIS — Z79899 Other long term (current) drug therapy: Secondary | ICD-10-CM | POA: Insufficient documentation

## 2024-01-20 DIAGNOSIS — K5900 Constipation, unspecified: Secondary | ICD-10-CM | POA: Diagnosis not present

## 2024-01-20 DIAGNOSIS — C50411 Malignant neoplasm of upper-outer quadrant of right female breast: Secondary | ICD-10-CM | POA: Diagnosis present

## 2024-01-20 DIAGNOSIS — C773 Secondary and unspecified malignant neoplasm of axilla and upper limb lymph nodes: Secondary | ICD-10-CM | POA: Diagnosis not present

## 2024-01-20 DIAGNOSIS — C7931 Secondary malignant neoplasm of brain: Secondary | ICD-10-CM | POA: Diagnosis not present

## 2024-01-20 DIAGNOSIS — F17200 Nicotine dependence, unspecified, uncomplicated: Secondary | ICD-10-CM | POA: Insufficient documentation

## 2024-01-20 DIAGNOSIS — G893 Neoplasm related pain (acute) (chronic): Secondary | ICD-10-CM | POA: Diagnosis not present

## 2024-01-20 DIAGNOSIS — C787 Secondary malignant neoplasm of liver and intrahepatic bile duct: Secondary | ICD-10-CM | POA: Insufficient documentation

## 2024-01-20 DIAGNOSIS — D539 Nutritional anemia, unspecified: Secondary | ICD-10-CM

## 2024-01-20 DIAGNOSIS — Z1721 Progesterone receptor positive status: Secondary | ICD-10-CM | POA: Diagnosis not present

## 2024-01-20 DIAGNOSIS — M51379 Other intervertebral disc degeneration, lumbosacral region without mention of lumbar back pain or lower extremity pain: Secondary | ICD-10-CM | POA: Diagnosis not present

## 2024-01-20 DIAGNOSIS — R569 Unspecified convulsions: Secondary | ICD-10-CM | POA: Insufficient documentation

## 2024-01-20 LAB — COMPREHENSIVE METABOLIC PANEL WITH GFR
ALT: 66 U/L — ABNORMAL HIGH (ref 0–44)
AST: 69 U/L — ABNORMAL HIGH (ref 15–41)
Albumin: 3.8 g/dL (ref 3.5–5.0)
Alkaline Phosphatase: 210 U/L — ABNORMAL HIGH (ref 38–126)
Anion gap: 13 (ref 5–15)
BUN: 14 mg/dL (ref 6–20)
CO2: 22 mmol/L (ref 22–32)
Calcium: 8.6 mg/dL — ABNORMAL LOW (ref 8.9–10.3)
Chloride: 100 mmol/L (ref 98–111)
Creatinine, Ser: 0.56 mg/dL (ref 0.44–1.00)
GFR, Estimated: >60 mL/min (ref >60)
Glucose, Bld: 121 mg/dL — ABNORMAL HIGH (ref 70–99)
Potassium: 4.4 mmol/L (ref 3.5–5.1)
Sodium: 135 mmol/L (ref 135–145)
Total Bilirubin: 0.3 mg/dL (ref 0.0–1.2)
Total Protein: 6.1 g/dL — ABNORMAL LOW (ref 6.5–8.1)

## 2024-01-20 LAB — CBC WITH DIFFERENTIAL/PLATELET
Abs Immature Granulocytes: 0.06 K/uL (ref 0.00–0.07)
Basophils Absolute: 0 K/uL (ref 0.0–0.1)
Basophils Relative: 0 %
Eosinophils Absolute: 0 K/uL (ref 0.0–0.5)
Eosinophils Relative: 0 %
HCT: 36.6 % (ref 36.0–46.0)
Hemoglobin: 11.7 g/dL — ABNORMAL LOW (ref 12.0–15.0)
Immature Granulocytes: 1 %
Lymphocytes Relative: 4 %
Lymphs Abs: 0.3 K/uL — ABNORMAL LOW (ref 0.7–4.0)
MCH: 31.6 pg (ref 26.0–34.0)
MCHC: 32 g/dL (ref 30.0–36.0)
MCV: 98.9 fL (ref 80.0–100.0)
Monocytes Absolute: 0.5 K/uL (ref 0.1–1.0)
Monocytes Relative: 6 %
Neutro Abs: 8 K/uL — ABNORMAL HIGH (ref 1.7–7.7)
Neutrophils Relative %: 89 %
Platelets: 112 K/uL — ABNORMAL LOW (ref 150–400)
RBC: 3.7 MIL/uL — ABNORMAL LOW (ref 3.87–5.11)
RDW: 18.5 % — ABNORMAL HIGH (ref 11.5–15.5)
WBC: 8.9 K/uL (ref 4.0–10.5)
nRBC: 0 % (ref 0.0–0.2)

## 2024-01-20 LAB — MAGNESIUM: Magnesium: 2 mg/dL (ref 1.7–2.4)

## 2024-01-20 LAB — PHOSPHORUS: Phosphorus: 2.9 mg/dL (ref 2.5–4.6)

## 2024-01-20 NOTE — Progress Notes (Signed)
 DISCONTINUE ON PATHWAY REGIMEN - Breast     A cycle is every 21 days:     Sacituzumab govitecan -hziy   **Always confirm dose/schedule in your pharmacy ordering system**  PRIOR TREATMENT: [id number]: Sacituzumab Govitecan  10 mg/kg D1, 8 q21 Days  START OFF PATHWAY REGIMEN - Breast   [id number]:Fam-trastuzumab deruxtecan-nxki 5.4 mg/kg IV D1 q21 Days:   A cycle is every 21 days:     Fam-trastuzumab deruxtecan-nxki   **Always confirm dose/schedule in your pharmacy ordering system**  Patient Characteristics: Distant Metastases or Locoregional Recurrent Disease - Unresectable, M0 or Locally Advanced Unresectable Disease Progressing after Neoadjuvant and Local Therapies, M0, HER2 Negative/Ultralow/Low, ER Negative, Molecular Alterations (BRAFm, MSI-H/dMMR,  TMB-H, or NTRK/RET Gene Fusion), All Mutations/Biomarkers Negative Therapeutic Status: Distant Metastases ER Status: Negative (-) PR Status: Negative (-) HER2 Status: Low Therapy Approach Indicated: Standard Therapy Exhausted and Molecular Alterations Present (BRAFm, MSI-H/dMMR, TMB-H, or NTRK/RET Gene Fusion) BRAF V600E Mutation Status: Negative Microsatellite/Mismatch Repair Status: MSS/pMMR Tumor Mutational Burden (TMB): TMB-L NTRK Gene Fusion Status: Negative RET Gene Fusion Status: Negative Other Mutations/Biomarkers: No Other Actionable Mutations Intent of Therapy: Non-Curative / Palliative Intent, Discussed with Patient

## 2024-01-21 ENCOUNTER — Other Ambulatory Visit: Payer: Self-pay

## 2024-01-21 LAB — CANCER ANTIGEN 15-3: CA 15-3: 592 U/mL — ABNORMAL HIGH (ref 0.0–25.0)

## 2024-01-21 LAB — CANCER ANTIGEN 27.29: CA 27.29: 781.9 U/mL — ABNORMAL HIGH (ref 0.0–38.6)

## 2024-01-26 ENCOUNTER — Other Ambulatory Visit: Payer: Self-pay

## 2024-01-27 ENCOUNTER — Other Ambulatory Visit: Payer: Self-pay

## 2024-01-28 ENCOUNTER — Inpatient Hospital Stay

## 2024-01-28 ENCOUNTER — Other Ambulatory Visit: Payer: Self-pay | Admitting: Oncology

## 2024-01-28 ENCOUNTER — Inpatient Hospital Stay: Admitting: Oncology

## 2024-01-28 VITALS — BP 119/71 | HR 85 | Temp 97.6°F | Resp 18

## 2024-01-28 DIAGNOSIS — Z170 Estrogen receptor positive status [ER+]: Secondary | ICD-10-CM

## 2024-01-28 DIAGNOSIS — D702 Other drug-induced agranulocytosis: Secondary | ICD-10-CM

## 2024-01-28 DIAGNOSIS — Z5112 Encounter for antineoplastic immunotherapy: Secondary | ICD-10-CM | POA: Diagnosis not present

## 2024-01-28 LAB — CBC WITH DIFFERENTIAL/PLATELET
Abs Immature Granulocytes: 0.05 K/uL (ref 0.00–0.07)
Basophils Absolute: 0 K/uL (ref 0.0–0.1)
Basophils Relative: 0 %
Eosinophils Absolute: 0 K/uL (ref 0.0–0.5)
Eosinophils Relative: 1 %
HCT: 39.9 % (ref 36.0–46.0)
Hemoglobin: 13.2 g/dL (ref 12.0–15.0)
Immature Granulocytes: 1 %
Lymphocytes Relative: 8 %
Lymphs Abs: 0.4 K/uL — ABNORMAL LOW (ref 0.7–4.0)
MCH: 31.6 pg (ref 26.0–34.0)
MCHC: 33.1 g/dL (ref 30.0–36.0)
MCV: 95.5 fL (ref 80.0–100.0)
Monocytes Absolute: 0.5 K/uL (ref 0.1–1.0)
Monocytes Relative: 10 %
Neutro Abs: 4.6 K/uL (ref 1.7–7.7)
Neutrophils Relative %: 80 %
Platelets: 127 K/uL — ABNORMAL LOW (ref 150–400)
RBC: 4.18 MIL/uL (ref 3.87–5.11)
RDW: 17.2 % — ABNORMAL HIGH (ref 11.5–15.5)
WBC: 5.7 K/uL (ref 4.0–10.5)
nRBC: 0 % (ref 0.0–0.2)

## 2024-01-28 LAB — COMPREHENSIVE METABOLIC PANEL WITH GFR
ALT: 74 U/L — ABNORMAL HIGH (ref 0–44)
AST: 82 U/L — ABNORMAL HIGH (ref 15–41)
Albumin: 3.5 g/dL (ref 3.5–5.0)
Alkaline Phosphatase: 327 U/L — ABNORMAL HIGH (ref 38–126)
Anion gap: 17 — ABNORMAL HIGH (ref 5–15)
BUN: 20 mg/dL (ref 6–20)
CO2: 20 mmol/L — ABNORMAL LOW (ref 22–32)
Calcium: 8.8 mg/dL — ABNORMAL LOW (ref 8.9–10.3)
Chloride: 95 mmol/L — ABNORMAL LOW (ref 98–111)
Creatinine, Ser: 0.66 mg/dL (ref 0.44–1.00)
GFR, Estimated: >60 mL/min
Glucose, Bld: 110 mg/dL — ABNORMAL HIGH (ref 70–99)
Potassium: 4.1 mmol/L (ref 3.5–5.1)
Sodium: 131 mmol/L — ABNORMAL LOW (ref 135–145)
Total Bilirubin: 0.6 mg/dL (ref 0.0–1.2)
Total Protein: 6.9 g/dL (ref 6.5–8.1)

## 2024-01-28 LAB — MAGNESIUM: Magnesium: 2.3 mg/dL (ref 1.7–2.4)

## 2024-01-28 MED ORDER — ONDANSETRON HCL 8 MG PO TABS: 8.0000 mg | ORAL_TABLET | Freq: Three times a day (TID) | ORAL | 1 refills | Status: DC | PRN

## 2024-01-28 MED ORDER — ZOLEDRONIC ACID 4 MG/100ML IV SOLN
4.0000 mg | Freq: Once | INTRAVENOUS | Status: AC
  Administered 2024-01-28: 4 mg via INTRAVENOUS
  Filled 2024-01-28: qty 100

## 2024-01-28 MED ORDER — LIDOCAINE-PRILOCAINE 2.5-2.5 % EX CREA: TOPICAL_CREAM | CUTANEOUS | 3 refills | Status: DC

## 2024-01-28 MED ORDER — DEXAMETHASONE 4 MG PO TABS: ORAL_TABLET | ORAL | 1 refills | Status: AC

## 2024-01-28 MED ORDER — DEXAMETHASONE SOD PHOSPHATE PF 10 MG/ML IJ SOLN
10.0000 mg | Freq: Once | INTRAMUSCULAR | Status: AC
  Administered 2024-01-28: 10 mg via INTRAVENOUS

## 2024-01-28 MED ORDER — PROCHLORPERAZINE MALEATE 10 MG PO TABS: 10.0000 mg | ORAL_TABLET | Freq: Four times a day (QID) | ORAL | 1 refills | Status: DC | PRN

## 2024-01-28 MED ORDER — DIPHENHYDRAMINE HCL 25 MG PO TABS
50.0000 mg | ORAL_TABLET | Freq: Once | ORAL | Status: AC
  Administered 2024-01-28: 50 mg via ORAL
  Filled 2024-01-28: qty 2

## 2024-01-28 MED ORDER — DIPHENHYDRAMINE HCL 25 MG PO CAPS: 50.0000 mg | ORAL_CAPSULE | Freq: Once | ORAL | Status: DC

## 2024-01-28 MED ORDER — ACETAMINOPHEN 325 MG PO TABS
650.0000 mg | ORAL_TABLET | Freq: Once | ORAL | Status: AC
  Administered 2024-01-28: 650 mg via ORAL
  Filled 2024-01-28: qty 2

## 2024-01-28 MED ORDER — FAM-TRASTUZUMAB DERUXTECAN-NXKI CHEMO 100 MG IV SOLR
5.4000 mg/kg | Freq: Once | INTRAVENOUS | Status: AC
  Administered 2024-01-28: 400 mg via INTRAVENOUS
  Filled 2024-01-28: qty 20

## 2024-01-28 MED ORDER — DEXTROSE 5 % IV SOLN: INTRAVENOUS | Status: DC

## 2024-01-28 MED ORDER — SODIUM CHLORIDE 0.9 % IV SOLN
150.0000 mg | Freq: Once | INTRAVENOUS | Status: AC
  Administered 2024-01-28: 150 mg via INTRAVENOUS
  Filled 2024-01-28: qty 150

## 2024-01-28 MED ORDER — PALONOSETRON HCL INJECTION 0.25 MG/5ML
0.2500 mg | Freq: Once | INTRAVENOUS | Status: AC
  Administered 2024-01-28: 0.25 mg via INTRAVENOUS
  Filled 2024-01-28: qty 5

## 2024-01-28 MED ORDER — DIVALPROEX SODIUM 500 MG PO DR TAB: 500.0000 mg | DELAYED_RELEASE_TABLET | Freq: Two times a day (BID) | ORAL | 0 refills | Status: DC

## 2024-01-28 NOTE — Progress Notes (Signed)
 Consent signed for enhertu  and information given.    Reviewed labs and CMET for treatment with oncologist and ok to treat verbal order Dr. Davonna.   Ok to treat with HR 100 and ECHO scheduled for 02/20/2023 verbal order Dr. Davonna.   Patient tolerated chemotherapy with no complaints voiced.  Side effects with management reviewed with understanding verbalized.  Port site clean and dry with no bruising or swelling noted at site.  Good blood return noted before and after administration of chemotherapy.  Band aid applied.  Patient left in satisfactory condition with VSS and no s/s of distress noted.

## 2024-01-28 NOTE — Patient Instructions (Addendum)
 [facility] Chemotherapy Teaching   You will see the doctor regularly throughout treatment.  We will obtain blood work from you prior to every treatment and monitor your results to make sure it is safe to give your treatment. The doctor monitors your response to treatment by the way you are feeling, your blood work, and by obtaining scans periodically.  There will be wait times while you are here for treatment.  It will take about 30 minutes to 1 hour for your lab work to result.  Then there will be wait times while pharmacy mixes your medications.     Fam-trastuzumab  deruxtecan (Enhertu )   About This Medicine   Fam-trastuzumab deruxtecan-nxki  is used to treat cancer. It is given in the vein (IV). The first infusion will take 90 minutes. All subsequent infusions will take 1 hour to infuse.  Possible Side Effects   Bone marrow suppression. This is a decrease in the number of white blood cells, red blood cells, and platelets. This may raise your risk of infection, make you tired and weak, and raise your risk of bleeding.    Nausea and vomiting (throwing up)    Constipation (not able to move bowels)    Diarrhea (loose bowel movements)    Tiredness    Fever    Decreased appetite (decreased hunger)    Changes in your liver function    Decreased level of potassium in your blood   Bone and muscle pain    Respiratory tract infection    Hair loss. Hair loss is often temporary, although with certain medicine, hair loss can sometimes be permanent. Hair loss may happen suddenly or gradually. If you lose hair, you may lose it from your head, face, armpits, pubic area, chest, and/or legs. You may also notice your hair getting thin.   Note: Each of the side effects above was reported in 20% or greater of patients treated with famtrastuzumab deruxtecan-nxki. All possible side effects are not included. Your side effects may be different depending on your cancer diagnosis,  condition, or if you are receiving other medicines in combination. Please discuss any concerns or questions with your medical team.   Warnings and Precautions   Inflammation (swelling) and/or scarring of the lungs, which can be life-threatening. You may have a cough and/or trouble breathing.    Severe decrease in the number of white blood cells, including neutropenic fever - a type of fever that can develop when you have a very low number of white blood cells which can be lifethreatening.  Changes in your hearts ability to pump blood properly   Note: Some of the side effects above are very rare. If you have concerns and/or questions, please discuss them with your medical team. Important Information  Cytotoxic medicines leave the body through urine and stool, but they can also be present in other body fluids such as blood, vomit, semen, and vaginal fluids. Take precautions to prevent others from coming in contact with your medicine or your body fluids. Follow safety precautions during your treatment and for as long as directed by your health care provider after your treatment. If you take a cytotoxic pill each day, follow these precautions every day.   Treating Side Effects    Manage tiredness by pacing your activities for the day.    Be sure to include periods of rest between energy-draining activities.    Get regular exercise, with your doctors approval. If you feel too tired to exercise vigorously, try taking  a short walk.    To decrease the risk of infection, wash your hands regularly.    Avoid close contact with people who have a cold, the flu, or other infections.    Take your temperature as your doctor or nurse tells you, and whenever you feel like you may have a fever.    To help decrease the risk of bleeding, use a soft toothbrush. Check with your nurse before using dental floss.    Be very careful when using knives or tools.    Use an electric shaver instead of a razor.     Drink Enough fluids to keep your urine pale yellow.    If you throw up or have diarrhea, you should drink more fluids so that you do not become dehydrated (lack of water in the body from losing too much fluid).    To help with nausea and vomiting, eat small, frequent meals instead of three large meals a day. Choose foods and drinks that are at room temperature. Ask your nurse or doctor about other helpful tips and medicine that is available to help stop or lessen these symptoms.    Ask your doctor or nurse about medicines that are available to help stop or lessen constipation or diarrhea.    If you have diarrhea, eat low-fiber foods that are high in protein and calories and avoid foods that can irritate your digestive tracts or lead to cramping.    If you are not able to move your bowels, check with your doctor or nurse before you use enemas, laxatives, or suppositories.    To help with decreased appetite, eat small, frequent meals. Eat foods high in calories and protein, such as meat, poultry, fish, dry beans, tofu, eggs, nuts, milk, yogurt, cheese, ice cream, pudding, and nutritional supplements.    Consider using sauces and spices to increase taste. Daily exercise, with your doctors approval, may increase your appetite.     Keeping your pain under control is important to your well-being. Please tell your doctor or nurse if you are experiencing pain.   Food and Medicine Interactions    There are no known interactions of fam-trastuzumab deruxtecan-nxki  with food.    Check with your doctor or pharmacist about all other prescription medicines and over-the-counter medicines and dietary supplements (vitamins, minerals, herbs, and others) you are taking before starting this medicine as there are known medicine interactions with fam-trastuzumab  deruxtecannxki. Also, check with your doctor or pharmacist before starting any new prescription or over-thecounter medicines, or dietary supplements to make  sure that there are no interactions.   When to Call the Doctor   Call your doctor or nurse if you have any of these symptoms and/or any new or unusual symptoms:    Fever of 100.4 F (38 C) or higher    Chills    Tiredness and/or weakness that interferes with your daily activities    Dry cough or coughing up yellow, green, or bloody mucus    Pain in your chest  Wheezing and/or trouble breathing    Feeling dizzy or lightheaded    Easy bleeding or bruising    Nausea that stops you from eating or drinking and/or is not relieved by prescribed medicines    Throwing up more than 3 times a day    No bowel movement in 3 days or when you feel uncomfortable    Diarrhea, 4 times in one day or diarrhea with lack of strength or a feeling of being dizzy  Lasting loss of appetite or rapid weight loss of five pounds in a week    Swelling of the hands, feet, or any other part of the body    Weight gain of 5 pounds in one week (fluid retention)    Pain that does not go away, or is not relieved by prescribed medicines    Signs of possible liver problems: dark urine, pale bowel movements, pain in your abdomen, feeling very tired and weak, unusual itching, or yellowing of the eyes or skin    If you think you may be pregnant or may have impregnated your partner   Reproduction Warnings    Pregnancy warning: This medicine can have harmful effects on the unborn baby. Women of childbearing potential should use effective methods of birth control during your cancer treatment and for 7 months after stopping treatment. Men with female partners of childbearing potential should use effective methods of birth control during your cancer treatment and for 4 months after stopping treatment. Let your doctor know right away if you think you may be pregnant or may have impregnated your partner.    Breastfeeding warning: Women should not breastfeed during treatment and for 7 months after stopping treatment  because this medicine could enter the breast milk and cause harm to a breastfeeding baby.    Fertility warning: In men, this medicine may affect your ability to have children in the future. Talk with your doctor or nurse if you plan to have children. Ask for information on sperm banking.   SELF CARE ACTIVITIES WHILE ON CHEMOTHERAPY/IMMUNOTHERAPY:  Hydration Increase your fluid intake and drink at least 64 ounces (1 Liter) of water/decaffeinated beverages per day after treatment. You can still have your cup of coffee or soda but these beverages do not count as part of your 8 to 12 cups that you need to drink daily. Limit alcohol intake.  Medications Continue taking your normal prescription medication as prescribed.  If you start any new herbal or new supplements please let us  know first to make sure it is safe.  Mouth Care Have teeth cleaned professionally before starting treatment. Keep dentures and partial plates clean. Use soft toothbrush and do not use mouthwashes that contain alcohol. Biotene is a good mouthwash that is available at most pharmacies or may be ordered by calling [phone]. Use warm salt water gargles (1 teaspoon salt per 1 quart warm water) before and after meals and at bedtime. Or you may rinse with 2 tablespoons of three-percent hydrogen peroxide mixed in eight ounces of water. If you are still having problems with your mouth or sores in your mouth please call the clinic. If you need dental work, please let the doctor know before you go for your appointment so that we can coordinate the best possible time for you in regards to your chemo regimen. You need to also let your dentist know that you are actively taking chemo. We may need to do labs prior to your dental appointment.  Skin Care Always use sunscreen that has not expired and with SPF (Sun Protection Factor) of 50 or higher. Wear hats to protect your head from the sun. Remember to use sunscreen on your hands, ears,  face, & feet.  Use good moisturizing lotions such as udder cream, eucerin, or even Vaseline. Some chemotherapies can cause dry skin, color changes in your skin and nails.    Avoid long, hot showers or baths. Use gentle, fragrance-free soaps and laundry detergent. Use moisturizers, preferably creams  or ointments rather than lotions because the thicker consistency is better at preventing skin dehydration. Apply the cream or ointment within 15 minutes of showering. Reapply moisturizer at night, and moisturize your hands every time after you wash them.   Infection Prevention Please wash your hands for at least 30 seconds using warm soapy water. Handwashing is the #1 way to prevent the spread of germs. Stay away from sick people or people who are getting over a cold. If you develop respiratory systems such as green/yellow mucus production or productive cough or persistent cough let us  know and we will see if you need an antibiotic. It is a good idea to keep a pair of gloves on when going into grocery stores/[employer] to decrease your risk of coming into contact with germs on the carts, etc. Carry alcohol hand gel with you at all times and use it frequently if out in public. If your temperature reaches 100.5 or higher please call the clinic and let us  know.  If it is after hours or on the weekend please go to the ER if your temperature is over 100.4.  Please have your own personal thermometer at home to use.    Sex and bodily fluids If you are going to have sex, a condom must be used to protect the person that isn't taking immunotherapy. For a few days after treatment, immunotherapy can be excreted through your bodily fluids.  When using the toilet please close the lid and flush the toilet twice.  Do this for a few day after you have had immunotherapy.   Contraception It is not known for sure whether or not immunotherapy drugs can be passed on through semen or secretions from the vagina. Because of this some  doctors advise people to use a barrier method if you have sex during treatment. This applies to vaginal, anal or oral sex.  Generally, doctors advise a barrier method only for the time you are actually having the treatment and for about a week after your treatment.  Advice like this can be worrying, but this does not mean that you have to avoid being intimate with your partner. You can still have close contact with your partner and continue to enjoy sex.  Animals If you have cats or birds we just ask that you not change the litter or change the cage.  Please have someone else do this for you while you are on immunotherapy.   Food Safety During and After Cancer Treatment Food safety is important for people both during and after cancer treatment. Cancer and cancer treatments, such as chemotherapy, radiation therapy, and stem cell/bone marrow transplantation, often weaken the immune system. This makes it harder for your body to protect itself from foodborne illness, also called food poisoning. Foodborne illness is caused by eating food that contains harmful bacteria, parasites, or viruses.  Foods to avoid Some foods have a higher risk of becoming tainted with bacteria. These include: Unwashed fresh fruit and vegetables, especially leafy vegetables that can hide dirt and other contaminants Raw sprouts, such as alfalfa sprouts Raw or undercooked beef, especially ground beef, or other raw or undercooked meat and poultry Fatty, fried, or spicy foods immediately before or after treatment.  These can sit heavy on your stomach and make you feel nauseous. Raw or undercooked shellfish, such as oysters. Sushi and sashimi, which often contain raw fish.  Unpasteurized beverages, such as unpasteurized fruit juices, raw milk, raw yogurt, or cider Undercooked eggs, such as soft boiled,  over easy, and poached; raw, unpasteurized eggs; or foods made with raw egg, such as homemade raw cookie dough and homemade  mayonnaise  Simple steps for food safety  Shop smart. Do not buy food stored or displayed in an unclean area. Do not buy bruised or damaged fruits or vegetables. Do not buy cans that have cracks, dents, or bulges. Pick up foods that can spoil at the end of your shopping trip and store them in a cooler on the way home.  Prepare and clean up foods carefully. Rinse all fresh fruits and vegetables under running water, and dry them with a clean towel or paper towel. Clean the top of cans before opening them. After preparing food, wash your hands for 20 seconds with hot water and soap. Pay special attention to areas between fingers and under nails. Clean your utensils and dishes with hot water and soap. Disinfect your kitchen and cutting boards using 1 teaspoon of liquid, unscented bleach mixed into 1 quart of water.    Dispose of old food. Eat canned and packaged food before its expiration date (the use by or best before date). Consume refrigerated leftovers within 3 to 4 days. After that time, throw out the food. Even if the food does not smell or look spoiled, it still may be unsafe. Some bacteria, such as Listeria, can grow even on foods stored in the refrigerator if they are kept for too long.  Take precautions when eating out. At restaurants, avoid buffets and salad bars where food sits out for a long time and comes in contact with many people. Food can become contaminated when someone with a virus, often a norovirus, or another bug handles it. Put any leftover food in a to-go container yourself, rather than having the server do it. And, refrigerate leftovers as soon as you get home. Choose restaurants that are clean and that are willing to prepare your food as you order it cooked.    SYMPTOMS TO REPORT AS SOON AS POSSIBLE AFTER TREATMENT:  FEVER GREATER THAN 100.4 F CHILLS WITH OR WITHOUT FEVER NAUSEA AND VOMITING THAT IS NOT CONTROLLED WITH YOUR NAUSEA MEDICATION UNUSUAL  SHORTNESS OF BREATH UNUSUAL BRUISING OR BLEEDING TENDERNESS IN MOUTH AND THROAT WITH OR WITHOUT PRESENCE OF ULCERS URINARY PROBLEMS BOWEL PROBLEMS UNUSUAL RASH     Wear comfortable clothing and clothing appropriate for easy access to any Portacath or PICC line. Let us  know if there is anything that we can do to make your therapy better!   What to do if you need assistance after hours or on the weekends: CALL [phone].  HOLD on the line, do not hang up.  You will hear multiple messages but at the end you will be connected with a nurse triage line.  They will contact the doctor if necessary.  Most of the time they will be able to assist you.  Do not call the hospital operator.    I have been informed and understand all of the instructions given to me and have received a copy. I have been instructed to call the clinic [phone] or my family physician as soon as possible for continued medical care, if indicated. I do not have any more questions at this time but understand that I may call the Cancer Center or the Patient Navigator at [phone] during office hours should I have questions or need assistance in obtaining follow-up care.     [facility] [place] - A DEPT OF [facility]  Discharge Instructions: Thank you for choosing [facility] to provide your oncology and hematology care.  If you have a lab appointment with the Cancer Center - please note that after April 8th, 2024, all labs will be drawn in the cancer center.  You do not have to check in or register with the main entrance as you have in the past but will complete your check-in in the cancer center.  Wear comfortable clothing and clothing appropriate for easy access to any Portacath or PICC line.   We strive to give you quality time with your provider. You may need to reschedule your appointment if you arrive late (15 or more minutes).  Arriving late affects you and other patients  whose appointments are after yours.  Also, if you miss three or more appointments without notifying the office, you may be dismissed from the clinic at the providers discretion.      For prescription refill requests, have your pharmacy contact our office and allow 72 hours for refills to be completed.    Today you received the following chemotherapy and/or immunotherapy agents enhertu       To help prevent nausea and vomiting after your treatment, we encourage you to take your nausea medication as directed.  BELOW ARE SYMPTOMS THAT SHOULD BE REPORTED IMMEDIATELY: *FEVER GREATER THAN 100.4 F (38 C) OR HIGHER *CHILLS OR SWEATING *NAUSEA AND VOMITING THAT IS NOT CONTROLLED WITH YOUR NAUSEA MEDICATION *UNUSUAL SHORTNESS OF BREATH *UNUSUAL BRUISING OR BLEEDING *URINARY PROBLEMS (pain or burning when urinating, or frequent urination) *BOWEL PROBLEMS (unusual diarrhea, constipation, pain near the anus) TENDERNESS IN MOUTH AND THROAT WITH OR WITHOUT PRESENCE OF ULCERS (sore throat, sores in mouth, or a toothache) UNUSUAL RASH, SWELLING OR PAIN  UNUSUAL VAGINAL DISCHARGE OR ITCHING   Items with * indicate a potential emergency and should be followed up as soon as possible or go to the Emergency Department if any problems should occur.  Please show the CHEMOTHERAPY ALERT CARD or IMMUNOTHERAPY ALERT CARD at check-in to the Emergency Department and triage nurse.  Should you have questions after your visit or need to cancel or reschedule your appointment, please contact [facility] [place] - A DEPT OF [facility] [phone]  and follow the prompts.  Office hours are 8:00 a.m. to 4:30 p.m. Monday - Friday. Please note that voicemails left after 4:00 p.m. may not be returned until the following business day.  We are closed weekends and major holidays. You have access to a nurse at all times for urgent questions. Please call the main number to the clinic [phone] and follow  the prompts.  For any non-urgent questions, you may also contact your provider using MyChart. We now offer e-Visits for anyone 71 and older to request care online for non-urgent symptoms. For details visit [email or web address].   Also download the MyChart app! Go to the app store, search MyChart, open the app, select [facility], and log in with your MyChart username and password.

## 2024-01-28 NOTE — Progress Notes (Signed)
 Pharmacist Chemotherapy Monitoring - Initial Assessment    Anticipated start date: 01/28/24    The following has been reviewed per standard work regarding the patient's treatment regimen: The patient's diagnosis, treatment plan and drug doses, and organ/hematologic function Lab orders and baseline tests specific to treatment regimen  The treatment plan start date, drug sequencing, and pre-medications Prior authorization status  Patient's documented medication list, including drug-drug interaction screen and prescriptions for anti-emetics and supportive care specific to the treatment regimen The drug concentrations, fluid compatibility, administration routes, and timing of the medications to be used The patient's access for treatment and lifetime cumulative dose history, if applicable  The patient's medication allergies and previous infusion related reactions, if applicable   Changes made to treatment plan:  N/A  Follow up needed:  N/A   Niels FORBES Molt, [facility], 01/28/2024  9:38 AM

## 2024-01-28 NOTE — Progress Notes (Signed)
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  The patient did note fatigue during radiation. The patient did not note hair loss or skin changes in the field of radiation during therapy. The patient is not taking dexamethasone . The patient does not have symptoms of  weakness or loss of control of the extremities. The patient does not have symptoms of headache. The patient does not have symptoms of seizure or uncontrolled movement. The patient does not have symptoms of changes in vision. The patient does not have changes in speech. The patient does not have confusion.   The patient was counseled that she will be contacted by our brain and spine navigator to schedule surveillance imaging. The patient was encouraged to call if she have not received a call to schedule imaging, or if she develops concerns or questions regarding radiation. The patient will also continue to follow up with Dr. Davonna in medical oncology.  "

## 2024-01-29 LAB — CANCER ANTIGEN 15-3: CA 15-3: 1083 U/mL — ABNORMAL HIGH (ref 0.0–25.0)

## 2024-01-29 LAB — CANCER ANTIGEN 27.29: CA 27.29: 1380 U/mL — ABNORMAL HIGH (ref 0.0–38.6)

## 2024-02-02 ENCOUNTER — Encounter (HOSPITAL_COMMUNITY)
Admission: RE | Admit: 2024-02-02 | Discharge: 2024-02-02 | Disposition: A | Discharge status: Home / Self Care | Source: Ambulatory Visit | Attending: Oncology | Admitting: Oncology

## 2024-02-02 DIAGNOSIS — C50411 Malignant neoplasm of upper-outer quadrant of right female breast: Secondary | ICD-10-CM | POA: Insufficient documentation

## 2024-02-02 DIAGNOSIS — Z170 Estrogen receptor positive status [ER+]: Secondary | ICD-10-CM | POA: Diagnosis present

## 2024-02-02 MED ORDER — FLUDEOXYGLUCOSE F - 18 (FDG) INJECTION
8.3400 | Freq: Once | INTRAVENOUS | Status: AC | PRN
  Administered 2024-02-02: 8.34 via INTRAVENOUS

## 2024-02-03 ENCOUNTER — Encounter: Payer: Self-pay | Admitting: *Deleted

## 2024-02-04 ENCOUNTER — Inpatient Hospital Stay

## 2024-02-04 ENCOUNTER — Telehealth: Payer: Self-pay

## 2024-02-04 NOTE — Telephone Encounter (Signed)
 RN returned call to Mrs. Monica Soto had question about steroid taper.   RN advised that Dexamethasone  2 mg po daily taper was to stop on 02/03/2024.  Mrs. Monica Soto had question about dexamethasone  8 mg to take after chemotherapy she was advised to reach out to Dr. Armanda office for verification and to inform them that she recently finished a steroid taper with radiation treatment.  Mrs. Monica Soto verbalized understanding and stated she will call Dr. Armanda office for verification of medication.

## 2024-02-05 ENCOUNTER — Inpatient Hospital Stay

## 2024-02-07 ENCOUNTER — Other Ambulatory Visit: Payer: Self-pay | Admitting: Oncology

## 2024-02-07 ENCOUNTER — Telehealth: Payer: Self-pay

## 2024-02-07 MED ORDER — DOXYCYCLINE HYCLATE 100 MG PO TABS: 100.0000 mg | ORAL_TABLET | Freq: Two times a day (BID) | ORAL | 0 refills | Status: AC

## 2024-02-07 NOTE — Telephone Encounter (Signed)
 Patient called stating there was some infection in her breast and when that happens the physician ordered Doxycyline to help with the infection.  Message sent to Randall Hope NP about the need of the medication.

## 2024-02-11 ENCOUNTER — Ambulatory Visit
Admission: RE | Admit: 2024-02-11 | Discharge: 2024-02-11 | Disposition: A | Discharge status: Home / Self Care | Source: Ambulatory Visit | Attending: Radiation Oncology | Admitting: Radiation Oncology

## 2024-02-11 DIAGNOSIS — C7951 Secondary malignant neoplasm of bone: Secondary | ICD-10-CM

## 2024-02-11 DIAGNOSIS — C50911 Malignant neoplasm of unspecified site of right female breast: Secondary | ICD-10-CM

## 2024-02-11 DIAGNOSIS — C50411 Malignant neoplasm of upper-outer quadrant of right female breast: Secondary | ICD-10-CM

## 2024-02-13 ENCOUNTER — Inpatient Hospital Stay: Attending: Hematology and Oncology | Admitting: Internal Medicine

## 2024-02-13 VITALS — BP 132/78 | HR 89 | Temp 100.0°F | Resp 20 | Wt 150.4 lb

## 2024-02-13 DIAGNOSIS — Z7983 Long term (current) use of bisphosphonates: Secondary | ICD-10-CM | POA: Insufficient documentation

## 2024-02-13 DIAGNOSIS — Z79899 Other long term (current) drug therapy: Secondary | ICD-10-CM | POA: Diagnosis not present

## 2024-02-13 DIAGNOSIS — Z860101 Personal history of adenomatous and serrated colon polyps: Secondary | ICD-10-CM | POA: Diagnosis not present

## 2024-02-13 DIAGNOSIS — G629 Polyneuropathy, unspecified: Secondary | ICD-10-CM | POA: Diagnosis not present

## 2024-02-13 DIAGNOSIS — F17210 Nicotine dependence, cigarettes, uncomplicated: Secondary | ICD-10-CM | POA: Diagnosis not present

## 2024-02-13 DIAGNOSIS — C773 Secondary and unspecified malignant neoplasm of axilla and upper limb lymph nodes: Secondary | ICD-10-CM | POA: Insufficient documentation

## 2024-02-13 DIAGNOSIS — R569 Unspecified convulsions: Secondary | ICD-10-CM | POA: Diagnosis not present

## 2024-02-13 DIAGNOSIS — C787 Secondary malignant neoplasm of liver and intrahepatic bile duct: Secondary | ICD-10-CM | POA: Insufficient documentation

## 2024-02-13 DIAGNOSIS — C7951 Secondary malignant neoplasm of bone: Secondary | ICD-10-CM | POA: Insufficient documentation

## 2024-02-13 DIAGNOSIS — Z8711 Personal history of peptic ulcer disease: Secondary | ICD-10-CM | POA: Diagnosis not present

## 2024-02-13 DIAGNOSIS — N6320 Unspecified lump in the left breast, unspecified quadrant: Secondary | ICD-10-CM | POA: Insufficient documentation

## 2024-02-13 DIAGNOSIS — I2510 Atherosclerotic heart disease of native coronary artery without angina pectoris: Secondary | ICD-10-CM | POA: Diagnosis not present

## 2024-02-13 DIAGNOSIS — I700 Atherosclerosis of aorta: Secondary | ICD-10-CM | POA: Insufficient documentation

## 2024-02-13 DIAGNOSIS — G4700 Insomnia, unspecified: Secondary | ICD-10-CM | POA: Diagnosis not present

## 2024-02-13 DIAGNOSIS — C50411 Malignant neoplasm of upper-outer quadrant of right female breast: Secondary | ICD-10-CM | POA: Insufficient documentation

## 2024-02-13 DIAGNOSIS — Z9221 Personal history of antineoplastic chemotherapy: Secondary | ICD-10-CM | POA: Insufficient documentation

## 2024-02-13 DIAGNOSIS — Z1721 Progesterone receptor positive status: Secondary | ICD-10-CM | POA: Diagnosis not present

## 2024-02-13 DIAGNOSIS — C50911 Malignant neoplasm of unspecified site of right female breast: Secondary | ICD-10-CM | POA: Diagnosis not present

## 2024-02-13 DIAGNOSIS — T380X5A Adverse effect of glucocorticoids and synthetic analogues, initial encounter: Secondary | ICD-10-CM | POA: Insufficient documentation

## 2024-02-13 DIAGNOSIS — Z923 Personal history of irradiation: Secondary | ICD-10-CM | POA: Insufficient documentation

## 2024-02-13 DIAGNOSIS — R29818 Other symptoms and signs involving the nervous system: Secondary | ICD-10-CM | POA: Diagnosis not present

## 2024-02-13 DIAGNOSIS — D649 Anemia, unspecified: Secondary | ICD-10-CM | POA: Diagnosis not present

## 2024-02-13 DIAGNOSIS — Z1732 Human epidermal growth factor receptor 2 negative status: Secondary | ICD-10-CM | POA: Diagnosis not present

## 2024-02-13 DIAGNOSIS — Z5112 Encounter for antineoplastic immunotherapy: Secondary | ICD-10-CM | POA: Diagnosis present

## 2024-02-13 DIAGNOSIS — Z8249 Family history of ischemic heart disease and other diseases of the circulatory system: Secondary | ICD-10-CM | POA: Insufficient documentation

## 2024-02-13 DIAGNOSIS — Z8719 Personal history of other diseases of the digestive system: Secondary | ICD-10-CM | POA: Insufficient documentation

## 2024-02-13 DIAGNOSIS — Z170 Estrogen receptor positive status [ER+]: Secondary | ICD-10-CM | POA: Diagnosis not present

## 2024-02-13 DIAGNOSIS — Z90710 Acquired absence of both cervix and uterus: Secondary | ICD-10-CM | POA: Insufficient documentation

## 2024-02-13 DIAGNOSIS — C7931 Secondary malignant neoplasm of brain: Secondary | ICD-10-CM | POA: Diagnosis not present

## 2024-02-13 DIAGNOSIS — Z9049 Acquired absence of other specified parts of digestive tract: Secondary | ICD-10-CM | POA: Insufficient documentation

## 2024-02-13 DIAGNOSIS — Z833 Family history of diabetes mellitus: Secondary | ICD-10-CM | POA: Insufficient documentation

## 2024-02-13 DIAGNOSIS — G893 Neoplasm related pain (acute) (chronic): Secondary | ICD-10-CM | POA: Insufficient documentation

## 2024-02-13 MED ORDER — LACOSAMIDE 50 MG PO TABS: 50.0000 mg | ORAL_TABLET | Freq: Two times a day (BID) | ORAL | 2 refills | Status: AC

## 2024-02-13 NOTE — Progress Notes (Signed)
 "  [facility] at [facility] [address]  [place], [place] [address] [phone]   New Patient Evaluation  Date of Service: 02/13/2024 Patient Name: Monica Soto Patient MRN: [id number] Patient DOB: 1965/06/05 Provider: Arthea MARLA Manns, MD  Identifying Statement:  Monica Soto is a 59 y.o. female with Cancer of right breast metastatic to brain [facility])  New onset seizure [facility]) who presents for initial consultation and evaluation regarding cancer associated neurologic deficits.    Referring Provider: [name], MD [address] [place],  [place] [address]  Primary Cancer:  Oncologic History: Oncology History  Malignant neoplasm of upper-outer quadrant of right breast in female, estrogen receptor positive (HCC)  06/19/2022 Mammogram   Mammogram showed large mass in the right breast which appears to measure 8 to 9 cm on ultrasound, 2 abnormal right axillary lymph node and a third borderline lymph node.  Skin thickening noted along the medial aspect of left breast which may be an extension of the marked skin thickening diffusely on the right.  Otherwise no left breast abnormalities are identified.   06/22/2022 Pathology Results   Needle core biopsy from the right breast upper outer quadrant showed grade 2 IDC, both lymph nodes with metastatic adenocarcinoma.  Prognostic showed ER 90% positive strong staining PR 70% positive strong staining Ki-67 of 20% HER2 negative by IHC 1+   07/03/2022 Initial Diagnosis   Malignant neoplasm of upper-outer quadrant of right breast in female, estrogen receptor positive (HCC)   07/04/2022 Cancer Staging   Staging form: Breast, AJCC 8th Edition - Clinical: Stage IV (cT4, cN2, cM1, G2, ER+, PR+, HER2-) - Signed by Loretha Ash, MD on 07/17/2022 Stage prefix: Initial diagnosis Histologic grading system: 3 grade system   07/16/2022 - 07/16/2022 Chemotherapy   Patient is on Treatment Plan : BREAST ADJUVANT DOSE DENSE AC q14d /  PACLitaxel  q7d      Genetic Testing   [facility] Custom Panel+RNA was Negative. Report date is 07/10/2022.  The Custom Hereditary Cancers Panel offered by [facility] includes sequencing and/or deletion duplication testing of the following 43 genes: APC, ATM, AXIN2, BAP1, BARD1, BMPR1A, BRCA1, BRCA2, BRIP1, CDH1, CDK4, CDKN2A (p14ARF and p16INK4a only), CHEK2, CTNNA1, EPCAM (Deletion/duplication testing only), FH, GREM1 (promoter region duplication testing only), HOXB13, KIT, MBD4, MEN1, MLH1, MSH2, MSH3, MSH6, MUTYH, NF1, NHTL1, PALB2, PDGFRA, PMS2, POLD1, POLE, PTEN, RAD51C, RAD51D, SMAD4, SMARCA4. STK11, TP53, TSC1, TSC2, and VHL.   12/13/2022 - 12/13/2022 Chemotherapy   Patient is on Treatment Plan : BREAST METASTATIC Fam-Trastuzumab Deruxtecan-nxki  (Enhertu ) (5.4) q21d     12/21/2022 - 05/23/2023 Chemotherapy   Patient is on Treatment Plan : BREAST Paclitaxel -Albumin (Abraxane ) (100) D1,8 + Carboplatin  AUC2 D1,8 every 3 weeks, q21d     06/10/2023 - 12/25/2023 Chemotherapy   Patient is on Treatment Plan : BREAST Sacituzumab govitecan -hziy (Trodelvy ) D1,8 q21d     01/28/2024 -  Chemotherapy   Patient is on Treatment Plan : BREAST Fam-Trastuzumab Deruxtecan-nxki  (Enhertu ) (5.4) q21d      CNS Oncologic History 01/14/24: Completes WBRT for CNS metastases with LMD involvement [facility])  History of Present Illness: The patient's records from the referring physician were obtained and reviewed and the patient interviewed to confirm this HPI.  Monica Soto presented to neurologic attention on 12/28/23 with first ever seizure.  Event was described as shaking all over for 30 seconds, followed by confusion.  In the hospital, she was started on seizure medication and obtained MRI, which demonstrated  likely spread of breast cancer to her brain.  She underwent whole brain radiation with Dr. Patrcia, completed on 01/13/24; this was overall well tolerated.  Was also started on Enhertu  infusions with Dr. Davonna, she  has completed 1 cycle.  No seizures since discharge, she continues on Depakote  500mg  twice per day.  Steroids have been fully weaned as well.  Functional status is otherwise intact, with some increase in fatigue and short term memory impairment  noted.  Medications: Medications Ordered Prior to Encounter[1]  Allergies: Allergies[2] Past Medical History:  Past Medical History:  Diagnosis Date   Depression    Diabetes mellitus without complication (HCC)    Erosive esophagitis 04/26/2010   EGD by Dr. Shaaron, small hiatal hernia   GERD (gastroesophageal reflux disease)    History of stomach ulcers    IBS (irritable bowel syndrome)    Diarrhea predominant   Microscopic colitis 04/26/2010   Colonoscopy by Dr. Shaaron, good response with Entocort   Tubular adenoma of colon 04/26/2010   Junction of descending and sigmoid 40 CM from anus   Past Surgical History:  Past Surgical History:  Procedure Laterality Date   ABDOMINAL HYSTERECTOMY     APPENDECTOMY  2/11   Dr. Floria with a delayed closure   BREAST BIOPSY Right 06/22/2022   US  RT BREAST BX W LOC DEV 1ST LESION IMG BX SPEC US  GUIDE 06/22/2022 [facility] MAMMOGRAPHY   PORTACATH PLACEMENT N/A 07/16/2022   Procedure: INSERTION PORT-A-CATH;  Surgeon: [name], MD;  Location: [facility];  Service: General;  Laterality: N/A;   TONSILLECTOMY     TUBAL LIGATION     Social History:  Social History   Socioeconomic History   Marital status: Married    Spouse name: Not on file   Number of children: Not on file   Years of education: Not on file   Highest education level: Not on file  Occupational History    Employer: [employer]    Comment: Third shift  Tobacco Use   Smoking status: Every Day    Current packs/day: 0.50    Types: Cigarettes   Smokeless tobacco: Never  Vaping Use   Vaping status: Never Used  Substance and Sexual Activity   Alcohol use: Yes    Alcohol/week: 1.0 standard drink of alcohol    Types: 1  Glasses of wine per week    Comment: seldom   Drug use: No   Sexual activity: Yes    Birth control/protection: Surgical  Other Topics Concern   Not on file  Social History Narrative   Not on file   Social Drivers of Health   Tobacco Use: High Risk (12/28/2023)   Patient History    Smoking Tobacco Use: Every Day    Smokeless Tobacco Use: Never    Passive Exposure: Not on file  Financial Resource Strain: Not on file  Food Insecurity: No Food Insecurity (12/29/2023)   Epic    Worried About Programme Researcher, Broadcasting/film/video in the Last Year: Never true    Ran Out of Food in the Last Year: Never true  Transportation Needs: No Transportation Needs (12/29/2023)   Epic    Lack of Transportation (Medical): No    Lack of Transportation (Non-Medical): No  Physical Activity: Not on file  Stress: Not on file  Social Connections: Not on file  Intimate Partner Violence: Not At Risk (12/29/2023)   Epic    Fear of Current or Ex-Partner: No    Emotionally Abused:  No    Physically Abused: No    Sexually Abused: No  Depression (PHQ2-9): Low Risk (02/13/2024)   Depression (PHQ2-9)    PHQ-2 Score: 0  Alcohol Screen: Not on file  Housing: Unknown (12/29/2023)   Epic    Unable to Pay for Housing in the Last Year: No    Number of Times Moved in the Last Year: Not on file    Homeless in the Last Year: No  Utilities: Not At Risk (12/29/2023)   Epic    Threatened with loss of utilities: No  Health Literacy: Not on file   Family History:  Family History  Problem Relation Age of Onset   Diabetes Mother    Coronary artery disease Mother    Healthy Father     Review of Systems: Constitutional: Doesn't report fevers, chills or abnormal weight loss Eyes: Doesn't report blurriness of vision Ears, nose, mouth, throat, and face: Doesn't report sore throat Respiratory: Doesn't report cough, dyspnea or wheezes Cardiovascular: Doesn't report palpitation, chest discomfort  Gastrointestinal:  Doesn't report  nausea, constipation, diarrhea GU: Doesn't report incontinence Skin: Doesn't report skin rashes Neurological: Per HPI Musculoskeletal: Doesn't report joint pain Behavioral/Psych: Doesn't report anxiety  Physical Exam: Vitals:   02/13/24 0920  BP: 132/78  Pulse: 89  Resp: 20  Temp: 100 F (37.8 C)   KPS: 90. General: Alert, cooperative, pleasant, in no acute distress Head: Normal EENT: No conjunctival injection or scleral icterus.  Lungs: Resp effort normal Cardiac: Regular rate Abdomen: Non-distended abdomen Skin: No rashes cyanosis or petechiae. Extremities: No clubbing or edema  Neurologic Exam: Mental Status: Awake, alert, attentive to examiner. Oriented to self and environment. Language is fluent with intact comprehension.  Cranial Nerves: Visual acuity is grossly normal. Visual fields are full. Extra-ocular movements intact. No ptosis. Face is symmetric Motor: Tone and bulk are normal. Power is full in both arms and legs. Reflexes are symmetric, no pathologic reflexes present.  Sensory: Intact to light touch Gait: Normal.   Labs: I have reviewed the data as listed    Component Value Date/Time   NA 131 (L) 01/28/2024 0833   K 4.1 01/28/2024 0833   CL 95 (L) 01/28/2024 0833   CO2 20 (L) 01/28/2024 0833   GLUCOSE 110 (H) 01/28/2024 0833   BUN 20 01/28/2024 0833   CREATININE 0.66 01/28/2024 0833   CREATININE 0.70 07/17/2022 0945   CALCIUM 8.8 (L) 01/28/2024 0833   PROT 6.9 01/28/2024 0833   ALBUMIN 3.5 01/28/2024 0833   AST 82 (H) 01/28/2024 0833   AST 11 (L) 07/17/2022 0945   ALT 74 (H) 01/28/2024 0833   ALT 14 07/17/2022 0945   ALKPHOS 327 (H) 01/28/2024 0833   BILITOT 0.6 01/28/2024 0833   BILITOT 0.3 07/17/2022 0945   GFRNONAA >60 01/28/2024 0833   GFRNONAA >60 07/17/2022 0945   GFRAA >60 03/29/2015 1616   Lab Results  Component Value Date   WBC 5.7 01/28/2024   NEUTROABS 4.6 01/28/2024   HGB 13.2 01/28/2024   HCT 39.9 01/28/2024   MCV 95.5  01/28/2024   PLT 127 (L) 01/28/2024    Imaging:  NM PET Image Restag (PS) Skull Base To Thigh Result Date: 02/11/2024 CLINICAL DATA:  Subsequent treatment strategy for stage IV breast cancer. Evaluate treatment response. Chemotherapy and radiation therapy in 2025. EXAM: NUCLEAR MEDICINE PET SKULL BASE TO THIGH TECHNIQUE: 8.4 mCi F-18 FDG was injected intravenously. Full-ring PET imaging was performed from the skull base to thigh after the radiotracer.  CT data was obtained and used for attenuation correction and anatomic localization. Fasting blood glucose: 80 mg/dl COMPARISON:  88/76/7974 abdominopelvic CT. Most recent PET of 11/21/2023. FINDINGS: Mediastinal blood pool activity: SUV max 1.7 Liver activity: SUV max NA NECK: No areas of abnormal hypermetabolism. Incidental CT findings: No cervical adenopathy. CHEST: No pulmonary parenchymal or thoracic nodal hypermetabolism. Incidental CT findings: Right Port-A-Cath tip mid right atrium. Aortic and coronary artery calcification. ABDOMEN/PELVIS: Marked progression of bilobar hepatic metastasis. Tumor involves the majority of the left hepatic lobe. Index mass straddling the left and right hepatic lobes measures 5.8 x 3.0 cm and SUV 11.7 on image 107/202. New or markedly progressive since the prior. Index right pericholecystic lesion measures 2.0 cm and SUV 9.6 on image 129/202. Compare 1.5 cm and SUV 5.1 on the prior. 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 Development of hypermetabolic bilateral inguinal adenopathy. Example right inguinal node at 11 mm and SUV 3.3 on image 162/202. Incidental CT findings: Normal adrenal glands. Abdominal aortic atherosclerosis. Pelvic floor laxity. Hysterectomy. Trace pelvic fluid, new. SKELETON: Diffuse osseous metastasis, progressive. An index area of hypermetabolism within the left femoral neck measures SUV 9.4 on image 168/202. Compare SUV 6.5 on the prior. Diffuse left scapular  body hypermetabolism measures SUV 11.1 today versus SUV 9.2 on the prior. Lateral right breast hypermetabolic mass measures 3.2 cm and SUV 11.6 on image 87/202. Relatively similar in CT appearance and SUV 5.1 on the prior. Incidental CT findings: None. IMPRESSION: 1. Moderate disease progression. 2. Marked hepatic metastatic disease progression. 3. New hypermetabolic right inguinal nodes, likely metastatic. 4. Mild progression of diffuse osseous metastasis. 5. Right lateral breast mass, similar in size but increasingly hypermetabolic. 6. Age advanced coronary artery atherosclerosis. Recommend assessment of coronary risk factors. 7.  Aortic Atherosclerosis (ICD10-I70.0). Electronically Signed   By: Rockey Kilts M.D.   On: 02/11/2024 09:32     Assessment/Plan Cancer of right breast metastatic to brain [facility])  New onset seizure (HCC)  Monica Soto presents with clinical syndrome c/w focal seizure and secondary generalization.  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  She completed and tolerated well the WBRT with Dr. Patrcia.  No breakthrough seizures since initiating Depakote  500mg  BID.  Keppra  was not tolerated due to agitation while inpatient.  We did discuss her AED regimen, highlighting several concerns with Depakote  (liver enzymes elevated, known cytopenias, potential for chemo interactions in the future).  She was agreeable with trial of Vimpat  50mg  BID, which is often a better option in the cancer population.  Depakote  would be stopped once Vimpat  is started.  If there are tolerability issues, like with Keppra , we would recommend stopping the new drug and resuming Depakote  at current dose.   We discussed epilepsy safety, she understands state laws regarding driving following seizures.   Should remain off decadron  if tolerated, there was very little edema on initial MRI study.   We spent twenty additional minutes  teaching regarding the natural history, biology, and historical experience in the treatment of neurologic complications of cancer.   We appreciate the opportunity to participate in the care of Monica Soto.   We ask that Monica Soto return to clinic in 2 months following next brain MRI, or sooner as needed.  All questions were answered. The patient knows to call the clinic with any problems, questions or concerns. No barriers to learning were detected.  The total time spent in the  encounter was 40 minutes and more than 50% was on counseling and review of test results   Arthea MARLA Manns, MD Medical Director of Neuro-Oncology [facility] 02/13/2024 9:45 AM     [1]  Current Outpatient Medications on File Prior to Visit  Medication Sig Dispense Refill   acetaminophen  (TYLENOL ) 500 MG tablet Take 1 tablet (500 mg total) by mouth every 6 (six) hours as needed for mild pain (pain score 1-3), fever or headache. 30 tablet 0   Camphor-Menthol-Methyl Sal (SALONPAS EX) Apply 1 Application topically daily as needed.     dexamethasone  (DECADRON ) 4 MG tablet Take 2 tablets (8 mg) by mouth daily for 3 days starting the day after chemotherapy. Take with food. 30 tablet 1   diphenoxylate -atropine  (LOMOTIL ) 2.5-0.025 MG tablet Take 2 tablets after first loose stool and 1 after each loose stool thereafter.  Do not exceed 8 tablets in 1 day. 30 tablet 3   divalproex  (DEPAKOTE ) 500 MG DR tablet Take 1 tablet (500 mg total) by mouth every 12 (twelve) hours. 60 tablet 0   doxycycline  (VIBRA -TABS) 100 MG tablet Take 1 tablet (100 mg total) by mouth 2 (two) times daily. 14 tablet 0   fluticasone  (FLONASE ) 50 MCG/ACT nasal spray Place 1 spray into both nostrils daily. (Patient taking differently: Place 1 spray into both nostrils daily as needed for rhinitis.) 18.2 mL 2   furosemide  (LASIX ) 20 MG tablet TAKE 1 TABLET(20 MG) BY MOUTH DAILY AS NEEDED FOR SWELLING 30 tablet 2    lidocaine -prilocaine  (EMLA ) cream APPLY TOPICALLY TO THE AFFECTED AREA 1 TIME 30 g 3   LORazepam  (ATIVAN ) 0.5 MG tablet Take 1 tab by mouth 1 hour prior to radiation 10 tablet 0   magnesium  oxide (MAG-OX) 400 (240 Mg) MG tablet Take 1 tablet (400 mg total) by mouth 2 (two) times daily. 60 tablet 3   nicotine  (NICODERM CQ  - DOSED IN MG/24 HOURS) 14 mg/24hr patch Place 1 patch (14 mg total) onto the skin daily.     ondansetron  (ZOFRAN ) 8 MG tablet TAKE 1 TABLET(8 MG) BY MOUTH EVERY 8 HOURS AS NEEDED FOR NAUSEA OR VOMITING. START THIRD DAY AFTER DOXORUBICIN/ CYCLOPHOSPHAMIDE CHEMOTHERAPY 30 tablet 2   oxyCODONE  (OXY IR/ROXICODONE ) 5 MG immediate release tablet Take 1 tablet (5 mg total) by mouth 2 (two) times daily as needed for severe pain (pain score 7-10). 60 tablet 0   pantoprazole  (PROTONIX ) 40 MG tablet Take 1 tablet (40 mg total) by mouth daily. 30 tablet 2   polyethylene glycol (MIRALAX  / GLYCOLAX ) 17 g packet Take 17 g by mouth daily as needed for mild constipation.     prochlorperazine  (COMPAZINE ) 10 MG tablet Take 1 tablet (10 mg total) by mouth every 6 (six) hours as needed for nausea or vomiting. 30 tablet 2   [DISCONTINUED] albuterol  (PROVENTIL  HFA;VENTOLIN  HFA) 108 (90 Base) MCG/ACT inhaler Inhale 2 puffs into the lungs every 4 (four) hours as needed for wheezing or shortness of breath. (Patient not taking: Reported on 01/11/2016) 1 Inhaler 0   No current facility-administered medications on file prior to visit.  [2]  Allergies Allergen Reactions   Keppra  [Levetiracetam ] Other (See Comments)    agitation   "

## 2024-02-15 ENCOUNTER — Other Ambulatory Visit: Payer: Self-pay

## 2024-02-18 ENCOUNTER — Inpatient Hospital Stay

## 2024-02-18 ENCOUNTER — Inpatient Hospital Stay (HOSPITAL_BASED_OUTPATIENT_CLINIC_OR_DEPARTMENT_OTHER): Admitting: Oncology

## 2024-02-18 VITALS — BP 102/68 | HR 71 | Temp 96.9°F | Resp 18

## 2024-02-18 DIAGNOSIS — Z170 Estrogen receptor positive status [ER+]: Secondary | ICD-10-CM

## 2024-02-18 DIAGNOSIS — Z720 Tobacco use: Secondary | ICD-10-CM | POA: Diagnosis not present

## 2024-02-18 DIAGNOSIS — C7951 Secondary malignant neoplasm of bone: Secondary | ICD-10-CM

## 2024-02-18 DIAGNOSIS — C50411 Malignant neoplasm of upper-outer quadrant of right female breast: Secondary | ICD-10-CM | POA: Diagnosis not present

## 2024-02-18 DIAGNOSIS — C50911 Malignant neoplasm of unspecified site of right female breast: Secondary | ICD-10-CM

## 2024-02-18 DIAGNOSIS — R197 Diarrhea, unspecified: Secondary | ICD-10-CM

## 2024-02-18 DIAGNOSIS — S21001S Unspecified open wound of right breast, sequela: Secondary | ICD-10-CM

## 2024-02-18 DIAGNOSIS — C7931 Secondary malignant neoplasm of brain: Secondary | ICD-10-CM

## 2024-02-18 DIAGNOSIS — R052 Subacute cough: Secondary | ICD-10-CM | POA: Diagnosis not present

## 2024-02-18 DIAGNOSIS — Z5112 Encounter for antineoplastic immunotherapy: Secondary | ICD-10-CM | POA: Diagnosis not present

## 2024-02-18 LAB — CBC WITH DIFFERENTIAL/PLATELET
Abs Immature Granulocytes: 0.22 K/uL — ABNORMAL HIGH (ref 0.00–0.07)
Basophils Absolute: 0.1 K/uL (ref 0.0–0.1)
Basophils Relative: 1 %
Eosinophils Absolute: 0 K/uL (ref 0.0–0.5)
Eosinophils Relative: 0 %
HCT: 33.1 % — ABNORMAL LOW (ref 36.0–46.0)
Hemoglobin: 10.6 g/dL — ABNORMAL LOW (ref 12.0–15.0)
Immature Granulocytes: 6 %
Lymphocytes Relative: 17 %
Lymphs Abs: 0.7 K/uL (ref 0.7–4.0)
MCH: 30.9 pg (ref 26.0–34.0)
MCHC: 32 g/dL (ref 30.0–36.0)
MCV: 96.5 fL (ref 80.0–100.0)
Monocytes Absolute: 0.4 K/uL (ref 0.1–1.0)
Monocytes Relative: 10 %
Neutro Abs: 2.7 K/uL (ref 1.7–7.7)
Neutrophils Relative %: 66 %
Platelets: 282 K/uL (ref 150–400)
RBC: 3.43 MIL/uL — ABNORMAL LOW (ref 3.87–5.11)
RDW: 16.9 % — ABNORMAL HIGH (ref 11.5–15.5)
Smear Review: NORMAL
WBC: 4 K/uL (ref 4.0–10.5)
nRBC: 0.5 % — ABNORMAL HIGH (ref 0.0–0.2)

## 2024-02-18 LAB — MAGNESIUM: Magnesium: 1.8 mg/dL (ref 1.7–2.4)

## 2024-02-18 LAB — COMPREHENSIVE METABOLIC PANEL WITH GFR
ALT: 11 U/L (ref 0–44)
AST: 42 U/L — ABNORMAL HIGH (ref 15–41)
Albumin: 3 g/dL — ABNORMAL LOW (ref 3.5–5.0)
Alkaline Phosphatase: 519 U/L — ABNORMAL HIGH (ref 38–126)
Anion gap: 13 (ref 5–15)
BUN: 5 mg/dL — ABNORMAL LOW (ref 6–20)
CO2: 22 mmol/L (ref 22–32)
Calcium: 7.9 mg/dL — ABNORMAL LOW (ref 8.9–10.3)
Chloride: 104 mmol/L (ref 98–111)
Creatinine, Ser: 0.49 mg/dL (ref 0.44–1.00)
GFR, Estimated: >60 mL/min
Glucose, Bld: 84 mg/dL (ref 70–99)
Potassium: 3.7 mmol/L (ref 3.5–5.1)
Sodium: 139 mmol/L (ref 135–145)
Total Bilirubin: 0.3 mg/dL (ref 0.0–1.2)
Total Protein: 5.5 g/dL — ABNORMAL LOW (ref 6.5–8.1)

## 2024-02-18 MED ORDER — DEXAMETHASONE SOD PHOSPHATE PF 10 MG/ML IJ SOLN
10.0000 mg | Freq: Once | INTRAMUSCULAR | Status: AC
  Administered 2024-02-18: 10 mg via INTRAVENOUS
  Filled 2024-02-18: qty 1

## 2024-02-18 MED ORDER — DIPHENHYDRAMINE HCL 25 MG PO TABS
50.0000 mg | ORAL_TABLET | Freq: Once | ORAL | Status: AC
  Administered 2024-02-18: 50 mg via ORAL
  Filled 2024-02-18: qty 2

## 2024-02-18 MED ORDER — DEXTROSE 5 % IV SOLN: INTRAVENOUS | Status: DC

## 2024-02-18 MED ORDER — FAM-TRASTUZUMAB DERUXTECAN-NXKI CHEMO 100 MG IV SOLR
5.4000 mg/kg | Freq: Once | INTRAVENOUS | Status: AC
  Administered 2024-02-18: 400 mg via INTRAVENOUS
  Filled 2024-02-18: qty 20

## 2024-02-18 MED ORDER — SODIUM CHLORIDE 0.9 % IV SOLN
150.0000 mg | Freq: Once | INTRAVENOUS | Status: AC
  Administered 2024-02-18: 150 mg via INTRAVENOUS
  Filled 2024-02-18: qty 150

## 2024-02-18 MED ORDER — PALONOSETRON HCL INJECTION 0.25 MG/5ML
0.2500 mg | Freq: Once | INTRAVENOUS | Status: AC
  Administered 2024-02-18: 0.25 mg via INTRAVENOUS
  Filled 2024-02-18: qty 5

## 2024-02-18 MED ORDER — ACETAMINOPHEN 325 MG PO TABS
650.0000 mg | ORAL_TABLET | Freq: Once | ORAL | Status: AC
  Administered 2024-02-18: 650 mg via ORAL
  Filled 2024-02-18: qty 2

## 2024-02-18 NOTE — Progress Notes (Signed)
 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   Liver biopsy at this time showed low ER positive, PR negative and HER2 low (1+) disease. Started on carboplatin  and Abraxane  and added pembrolizumab  and PD-L1 NGS came back as 10. Progressed on PET scan from 05/2023 and patient was started on Sacituzumab govitecan  on 06/10/2023 Patient had a new onset brain metastasis and progression on PET scan.  Started on Enhertu  01/28/2024.  -We reviewed the PET scan findings together.  Patient has progression of cancer.  Not hepatic disease progression and also new hypermetabolic right inguinal lymph nodes also there is mild progression of diffuse osseous metastasis.  We will use this as a new baseline for Enhertu  treatment.  Will repeat in 3 months. - Was previously seen at [facility] for second opinion and possible clinical trial evaluation.  Seen by Dr. Dominick.  She is being tested for a possible phase 2 clinical trial at progression.  I discussed starting Enhertu  with Dr. Dominick - Reviewed  labs today: CMP: Calcium: 7.9, alkaline phosphatase: 519, AST: 42, ALT: 11, normal creatinine.  CBC: Hemoglobin: 10.6, normal WBC and platelets. - Physical exam stable.  Will proceed with treatment today.  Return to clinic in 6 weeks.  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  MRI brain consistent with brain metastasis. 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  - Patient was seen by Dr. Buckley and was started on Vimpat  50 mg twice daily and Depakote  discontinued. - Continued seizure prophylaxis per neurology recommendations. - Completed steroid taper per radiation oncology guidance. - Scheduled MRI brain surveillance to monitor for new or recurrent lesions.  Will schedule this in March. - Instructed to report any new neurological symptoms or seizure activity.  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 Current pain management with ibuprofen  and Tylenol   - Continue prescribe oxycodone  5 mg, twice a day, with the option to take once a day before bed.  Normocytic anemia Anemia is improving with a current hemoglobin level of 11.4, which is higher than previous levels.  IV iron  supplementation and dietary changes are contributing to this improvement.  - Continue iron  supplementation every other day. - Encourage dietary intake to support hemoglobin levels.  Metastasis to bone Patient has multiple bony metastasis that showed progression on recent PET scan.  Patient reported left shoulder pain. Currently on Zometa  4 mg every 3 months   -Continue Zometa  4 mg IV every 3 months -Continue calcium and vitamin D supplementation - Will reach out to  Dr. Patrcia for palliative radiation of the left shoulder for pain control  Peripheral neuropathy Peripheral neuropathy is well-managed with no new or  worsening symptoms.  Wound care for breast wound The wound is improving, with reduced symptoms. It is kept covered with a bandage to prevent oozing, and occasional itching is likely due to secretions.  - Continue current wound care regimen.  Tobacco use disorder Actively attempting smoking cessation using nicotine  patches.  - Encouraged continued use of nicotine  replacement therapy. - Provided positive reinforcement for smoking cessation efforts.  Steroid-induced insomnia Resolved after stopping steroids.  Cough Resolved at this time  The patient understands the plans discussed today and is in agreement with them.  She knows to contact our office if she develops concerns prior to her next appointment.  The total time spent in the appointment was 30 minutes for the encounter with patient, including review of chart and various tests results, discussions about plan of care and coordination of care plan   I,Helena R Teague,acting as a scribe for Mickiel Dry, MD.,have documented all relevant documentation on the behalf of Mickiel Dry, MD,as directed by  Mickiel Dry, MD while in the presence of Mickiel Dry, MD.  I, Mickiel Dry MD, have reviewed the above documentation for accuracy and completeness, and I agree with the above.     Mickiel Dry, MD  [facility] [place] - A DEPT OF [facility] [address] [place] [address] Dept: [phone] Dept Fax: [phone]   No orders of the defined types were placed in this encounter.    ONCOLOGY HISTORY:   I have reviewed her chart and materials related to her cancer extensively and collaborated history with the patient. 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 There  are 2 abnormal right axillary lymph nodes, and a third borderline lymph node. Skin thickening is noted along the medial aspect of the left breast, which may be an extension of the marked skin thickening diffusely on the right. -06/22/2022: Right breast biopsy. Pathology: Invasive moderately differentiated ductal adenocarcinoma, grade 2. ER positive (90%), PR positive (70%), Her2 negative (+1). Ki-67: 20%. 2/2 Right axillary lymph nodes positive for metastatic adenocarcinoma.  -07/12/2022: Initial PET: Intensely hypermetabolic RIGHT breast mass consistent with primary breast carcinoma. Hypermetabolic metastatic RIGHT axillary lymph nodes. Hypermetabolic metastatic lymph node in the posterior triangle of the RIGHT neck. Hypermetabolic LEFT axillary lymph nodes. No evidence of metastatic adenopathy in the mediastinum or abdomen pelvis. No evidence of visceral metastasis. Multiple intensely hypermetabolic skeletal metastasis. -07/04/2022: [employer]:Germline testing : Negative. 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 Abnormal enhancement extends posteriorly to broadly and deeply involve the right chest wall including the pectoralis and underlying intercostal muscles with a small area of abnormal enhancement extending to the underlying pleura. Enhancement extends medially to the right internal mammary chain. 1.8 cm ill-defined enhancing mass in the lateral left breast suspicious for additional malignancy. Left medial breast skin thickening and underlying trabecular thickening with associated abnormal enhancement suspicious for contralateral extension of inflammatory breast carcinoma. Prominent subcentimeter left axillary lymph nodes.  -07/16/2022: Port placement -07/25/2022-12/13/2022: Anastrozole  and ribociclib  treatment, discontinued due to  progression 07/27/2022-  Current: Zometa  IV 4 mg every 3 months. -08/08/2022: CA 15-3: 201.0. CA 27.29: 337.7. -11/15/2022: PET:  New hypermetabolic skeletal metastasis.  New hypermetabolic liver metastasis. New hypermetabolic periportal lymph nodes. New hypermetabolic cutaneous tissue in the RIGHT breast. New hypermetabolic glandular tissue in the LEFT breast. Persistent hypermetabolic tissue in the RIGHT breast with extension to the skin surface. -12/01/2022: [employer]: APC G1312, TP53, MYC amplification, FGFR2 amplification, EGFR amplification  -12/07/2022: Liver biopsy. Pathology: Metastatic poorly differentiated adenocarcinoma consistent with breast primary.  ER: Positive, 2%, PR: Negative, 0%, HER2: Negative(1+) -12/17/2022:  PD-L1 (22 C3): 0%  -12/21/2022 -05/30/2023: Abraxane  125mg /m2 with carboplatin  2 AUC added on with cycle 2 on 01/10/2023, discontinued due to progression -01/02/2023: [facility] NGS: MS-stable, TMB-low, PTEN pathogenic variant, genomic LOH high (18%), HER2 (1+), PD-L1 (22 C3) CPS-10.  -03/14/2023-05/16/2023:  Added Pembrolizumab  200mg  every 3 weeks -05/30/2023: PET: Substantially worsened burden of metastatic disease to the liver including new and enlarged lesions. Substantially worsened burden of metastatic disease to the skeleton including new and enlarged lesions. New and increased hypermetabolic nodularity along the right breast cutaneous surface, compatible with cutaneous metastatic disease. Mildly accentuated metabolic activity in a right hilar lymph node, maximum SUV 3.3 and formerly 2.8. -06/10/2023-12/24/2023: Sacituzumab govitecan  started with 1 dose of 10mg /kg and then decreased to 7.5mg /kg every 3 weeks.  Discontinued for progression with brain metastasis. -08/29/2023: PET: Marked interval improvement in RIGHT breast carcinoma. Marked interval improvement in hepatic metastasis. Mixed response in skeletal metastasis. Several lesions are improved compared to prior where  several lesions are new. Persistent widespread skeletal metastasis. -10/09/2023: Seen at [facility] by Francetta Sayer. Recommended continuing Sacituzumab govitecan  at this time but also recommended Enhertu  at progression.  She has been tested for nectin for amplification to be considered for a clinical trial. - 11/21/2023: PET scan: Bilateral hepatic metastases are relatively similar overall. The inferior right hepatic lobe lesion demonstrates decreased hypermetabolism with similar size. A pericholecystic right hepatic lobe lesion is newly hypermetabolic. The dominant central left hepatic lobe lesion is relatively similar. Osseous metastases are relatively similar overall. A scapular index lesion demonstrates increased activity today. Right breast hypermetabolism is mildly increased. 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 -01/01/2024 - 01/14/2024: Whole brain radiation -02/02/2024: PET:  Moderate disease progression. Marked hepatic metastatic disease progression. New hypermetabolic right inguinal nodes, likely metastatic. Mild progression of diffuse osseous metastasis. Right lateral breast mass, similar in size but increasingly hypermetabolic.  Current Treatment:  Planned Enhertu   INTERVAL HISTORY:   Monica Soto is here today for follow-up of metastatic right breast cancer. She is accompanied by her daughter today.   Monica Soto reports nausea, fatigue, and taste change due to therapy. She states though her appetite is healthy, most foods taste terrible. She notes baked potatoes and citrus flavored foods are appetizing.   Monica Soto reports left shoulder pain, corresponding to an osseous metastasis site. Pain is noticeable and worsened when she is not taking OTC pain medication, such  as Tylenol . She rarely requires oxycodone  for left shoulder pain, and only when pain wakes her up at night. She denies any hip pain in osseous metastasis sites. Her abdominal pain, likely due to hepatic metastases, has improved since starting Enhertu .   Her cough and post nasal drip, which she attributes to congestion after RT, has improved. Her peripheral neuropathy  is stable. She notes extremity swelling and began using compression socks yesterday.   Monica Soto has been using doxycyline tablets to treat right breast wound. She has also been using xeroform and denies any infection on her right breast wound. Her sleeping has improved since finishing her steroid taper.   Monica Soto saw Dr. Buckley on 02/13/2024, who switched her anti seizure medication to Vimpat  50mg  BID.  I have reviewed the past medical history, past surgical history, social history and family history with the patient and they are unchanged from previous note.  ALLERGIES:  is allergic to keppra  [levetiracetam ].  MEDICATIONS:  Current Outpatient Medications  Medication Sig Dispense Refill   acetaminophen  (TYLENOL ) 500 MG tablet Take 1 tablet (500 mg total) by mouth every 6 (six) hours as needed for mild pain (pain score 1-3), fever or headache. 30 tablet 0   Camphor-Menthol-Methyl Sal (SALONPAS EX) Apply 1 Application topically daily as needed.     dexamethasone  (DECADRON ) 4 MG tablet Take 2 tablets (8 mg) by mouth daily for 3 days starting the day after chemotherapy. Take with food. 30 tablet 1   diphenoxylate -atropine  (LOMOTIL ) 2.5-0.025 MG tablet Take 2 tablets after first loose stool and 1 after each loose stool thereafter.  Do not exceed 8 tablets in 1 day. 30 tablet 3   doxycycline  (VIBRA -TABS) 100 MG tablet Take 1 tablet (100 mg total) by mouth 2 (two) times daily. 14 tablet 0   fluticasone  (FLONASE ) 50 MCG/ACT nasal spray Place 1 spray into both nostrils daily. (Patient taking differently: Place 1 spray into both nostrils daily as  needed for rhinitis.) 18.2 mL 2   furosemide  (LASIX ) 20 MG tablet TAKE 1 TABLET(20 MG) BY MOUTH DAILY AS NEEDED FOR SWELLING 30 tablet 2   lacosamide  (VIMPAT ) 50 MG TABS tablet Take 1 tablet (50 mg total) by mouth 2 (two) times daily. 60 tablet 2   lidocaine -prilocaine  (EMLA ) cream APPLY TOPICALLY TO THE AFFECTED AREA 1 TIME 30 g 3   LORazepam  (ATIVAN ) 0.5 MG tablet Take 1 tab by mouth 1 hour prior to radiation 10 tablet 0   magnesium  oxide (MAG-OX) 400 (240 Mg) MG tablet Take 1 tablet (400 mg total) by mouth 2 (two) times daily. 60 tablet 3   nicotine  (NICODERM CQ  - DOSED IN MG/24 HOURS) 14 mg/24hr patch Place 1 patch (14 mg total) onto the skin daily.     ondansetron  (ZOFRAN ) 8 MG tablet TAKE 1 TABLET(8 MG) BY MOUTH EVERY 8 HOURS AS NEEDED FOR NAUSEA OR VOMITING. START THIRD DAY AFTER DOXORUBICIN/ CYCLOPHOSPHAMIDE CHEMOTHERAPY 30 tablet 2   oxyCODONE  (OXY IR/ROXICODONE ) 5 MG immediate release tablet Take 1 tablet (5 mg total) by mouth 2 (two) times daily as needed for severe pain (pain score 7-10). 60 tablet 0   pantoprazole  (PROTONIX ) 40 MG tablet Take 1 tablet (40 mg total) by mouth daily. 30 tablet 2   polyethylene glycol (MIRALAX  / GLYCOLAX ) 17 g packet Take 17 g by mouth daily as needed for mild constipation.     prochlorperazine  (COMPAZINE ) 10 MG tablet Take 1 tablet (10 mg total) by mouth every 6 (six) hours as needed for nausea or vomiting. 30 tablet 2   No current facility-administered medications for this visit.   Facility-Administered Medications Ordered in Other Visits  Medication Dose Route Frequency Provider Last Rate Last Admin   dextrose  5 % solution   Intravenous Continuous [name], MD   Stopped at 02/18/24 1142    VITALS:  There were no vitals taken for this  visit.  Wt Readings from Last 3 Encounters:  02/18/24 151 lb 3.2 oz (68.6 kg)  02/13/24 150 lb 6.4 oz (68.2 kg)  01/28/24 149 lb 0.5 oz (67.6 kg)    There is no height or weight on file to calculate  BMI.  Performance status (ECOG): 1 - Symptomatic but completely ambulatory  PHYSICAL EXAM:   GENERAL:alert, no distress and comfortable SKIN: skin color, texture, turgor are normal, no rashes or significant lesions BREAST: Healing right breast wounds with no active discharge covered with bandage.  Left breast exam: Normal with no lumps. LYMPH:  no palpable lymphadenopathy in the cervical, axillary or inguinal LUNGS: clear to auscultation and percussion with normal breathing effort HEART: regular rate & rhythm and no murmurs and no lower extremity edema ABDOMEN:abdomen soft, non-tender and normal bowel sounds Musculoskeletal:no cyanosis of digits and no clubbing  NEURO: alert & oriented x 3 with fluent speech   LABORATORY DATA:  I have reviewed the data as listed    Component Value Date/Time   NA 139 02/18/2024 0804   K 3.7 02/18/2024 0804   CL 104 02/18/2024 0804   CO2 22 02/18/2024 0804   GLUCOSE 84 02/18/2024 0804   BUN 5 (L) 02/18/2024 0804   CREATININE 0.49 02/18/2024 0804   CREATININE 0.70 07/17/2022 0945   CALCIUM 7.9 (L) 02/18/2024 0804   PROT 5.5 (L) 02/18/2024 0804   ALBUMIN 3.0 (L) 02/18/2024 0804   AST 42 (H) 02/18/2024 0804   AST 11 (L) 07/17/2022 0945   ALT 11 02/18/2024 0804   ALT 14 07/17/2022 0945   ALKPHOS 519 (H) 02/18/2024 0804   BILITOT 0.3 02/18/2024 0804   BILITOT 0.3 07/17/2022 0945   GFRNONAA >60 02/18/2024 0804   GFRNONAA >60 07/17/2022 0945   GFRAA >60 03/29/2015 1616    Lab Results  Component Value Date   WBC 4.0 02/18/2024   NEUTROABS 2.7 02/18/2024   HGB 10.6 (L) 02/18/2024   HCT 33.1 (L) 02/18/2024   MCV 96.5 02/18/2024   PLT 282 02/18/2024    Latest Reference Range & Units 01/28/24 08:33  CA 15-3 0.0 - 25.0 U/mL 1,083.0 (H)  CA 27.29 0.0 - 38.6 U/mL 1,380.0 (H)  (H): Data is abnormally high  RADIOGRAPHIC STUDIES: I have personally reviewed the radiological images as listed and agreed with the findings in the report.  NM PET  Image Restag (PS) Skull Base To Thigh CLINICAL DATA:  Subsequent treatment strategy for stage IV breast cancer. Evaluate treatment response. Chemotherapy and radiation therapy in 2025.  EXAM: NUCLEAR MEDICINE PET SKULL BASE TO THIGH  TECHNIQUE: 8.4 mCi F-18 FDG was injected intravenously. Full-ring PET imaging was performed from the skull base to thigh after the radiotracer. CT data was obtained and used for attenuation correction and anatomic localization.  Fasting blood glucose: 80 mg/dl  COMPARISON:  88/76/7974 abdominopelvic CT. Most recent PET of 11/21/2023.  FINDINGS: Mediastinal blood pool activity: SUV max 1.7  Liver activity: SUV max NA  NECK: No areas of abnormal hypermetabolism.  Incidental CT findings: No cervical adenopathy.  CHEST: No pulmonary parenchymal or thoracic nodal hypermetabolism.  Incidental CT findings: Right Port-A-Cath tip mid right atrium. Aortic and coronary artery calcification.  ABDOMEN/PELVIS: Marked progression of bilobar hepatic metastasis. Tumor involves the majority of the left hepatic lobe. Index mass straddling the left and right hepatic lobes measures 5.8 x 3.0 cm and SUV 11.7 on image 107/202. New or markedly progressive since the prior.  Index right pericholecystic lesion measures 2.0 cm  and SUV 9.6 on image 129/202. Compare 1.5 cm and SUV 5.1 on the prior.  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  Development of hypermetabolic bilateral inguinal adenopathy. Example right inguinal node at 11 mm and SUV 3.3 on image 162/202.  Incidental CT findings: Normal adrenal glands. Abdominal aortic atherosclerosis. Pelvic floor laxity. Hysterectomy. Trace pelvic fluid, new.  SKELETON: Diffuse osseous metastasis, progressive. An index area of hypermetabolism within the left femoral neck measures SUV 9.4 on image 168/202. Compare SUV 6.5 on the prior.  Diffuse left scapular body  hypermetabolism measures SUV 11.1 today versus SUV 9.2 on the prior.  Lateral right breast hypermetabolic mass measures 3.2 cm and SUV 11.6 on image 87/202. Relatively similar in CT appearance and SUV 5.1 on the prior.  Incidental CT findings: None.  IMPRESSION: 1. Moderate disease progression. 2. Marked hepatic metastatic disease progression. 3. New hypermetabolic right inguinal nodes, likely metastatic. 4. Mild progression of diffuse osseous metastasis. 5. Right lateral breast mass, similar in size but increasingly hypermetabolic. 6. Age advanced coronary artery atherosclerosis. Recommend assessment of coronary risk factors. 7.  Aortic Atherosclerosis (ICD10-I70.0).  Electronically Signed   By: Rockey Kilts M.D.   On: 02/11/2024 09:32   "

## 2024-02-18 NOTE — Patient Instructions (Signed)

## 2024-02-18 NOTE — Patient Instructions (Signed)
 [facility] [place] - A DEPT OF [facility]  Discharge Instructions: Thank you for choosing [facility] to provide your oncology and hematology care.  If you have a lab appointment with the Cancer Center - please note that after April 8th, 2024, all labs will be drawn in the cancer center.  You do not have to check in or register with the main entrance as you have in the past but will complete your check-in in the cancer center.  Wear comfortable clothing and clothing appropriate for easy access to any Portacath or PICC line.   We strive to give you quality time with your provider. You may need to reschedule your appointment if you arrive late (15 or more minutes).  Arriving late affects you and other patients whose appointments are after yours.  Also, if you miss three or more appointments without notifying the office, you may be dismissed from the clinic at the provider's discretion.      For prescription refill requests, have your pharmacy contact our office and allow 72 hours for refills to be completed.    Today you received the following chemotherapy and/or immunotherapy agents Enhertu   To help prevent nausea and vomiting after your treatment, we encourage you to take your nausea medication as directed.  BELOW ARE SYMPTOMS THAT SHOULD BE REPORTED IMMEDIATELY: *FEVER GREATER THAN 100.4 F (38 C) OR HIGHER *CHILLS OR SWEATING *NAUSEA AND VOMITING THAT IS NOT CONTROLLED WITH YOUR NAUSEA MEDICATION *UNUSUAL SHORTNESS OF BREATH *UNUSUAL BRUISING OR BLEEDING *URINARY PROBLEMS (pain or burning when urinating, or frequent urination) *BOWEL PROBLEMS (unusual diarrhea, constipation, pain near the anus) TENDERNESS IN MOUTH AND THROAT WITH OR WITHOUT PRESENCE OF ULCERS (sore throat, sores in mouth, or a toothache) UNUSUAL RASH, SWELLING OR PAIN  UNUSUAL VAGINAL DISCHARGE OR ITCHING   Items with * indicate a potential emergency and should be followed up as  soon as possible or go to the Emergency Department if any problems should occur.  Please show the CHEMOTHERAPY ALERT CARD or IMMUNOTHERAPY ALERT CARD at check-in to the Emergency Department and triage nurse.  Should you have questions after your visit or need to cancel or reschedule your appointment, please contact [facility] [place] - A DEPT OF [facility] [phone]  and follow the prompts.  Office hours are 8:00 a.m. to 4:30 p.m. Monday - Friday. Please note that voicemails left after 4:00 p.m. may not be returned until the following business day.  We are closed weekends and major holidays. You have access to a nurse at all times for urgent questions. Please call the main number to the clinic [phone] and follow the prompts.  For any non-urgent questions, you may also contact your provider using MyChart. We now offer e-Visits for anyone 41 and older to request care online for non-urgent symptoms. For details visit [email or web address].   Also download the MyChart app! Go to the app store, search "MyChart", open the app, select , and log in with your MyChart username and password.

## 2024-02-18 NOTE — Progress Notes (Signed)

## 2024-02-18 NOTE — Progress Notes (Signed)
 Patient has been examined by Dr. Davonna. Vital signs and labs have been reviewed by MD - ANC, Creatinine, LFTs, hemoglobin, and platelets have been reviewed by M.D. - pt may proceed with treatment.  Primary RN and pharmacy notified.

## 2024-02-19 ENCOUNTER — Ambulatory Visit
Admission: RE | Admit: 2024-02-19 | Discharge: 2024-02-19 | Disposition: A | Discharge status: Home / Self Care | Source: Ambulatory Visit | Attending: Radiation Oncology | Admitting: Radiation Oncology

## 2024-02-19 ENCOUNTER — Ambulatory Visit: Admitting: Urology

## 2024-02-19 ENCOUNTER — Encounter: Payer: Self-pay | Admitting: Oncology

## 2024-02-19 ENCOUNTER — Encounter (HOSPITAL_COMMUNITY): Payer: Self-pay | Admitting: Oncology

## 2024-02-19 ENCOUNTER — Ambulatory Visit
Admission: RE | Admit: 2024-02-19 | Discharge: 2024-02-19 | Disposition: A | Discharge status: Home / Self Care | Source: Ambulatory Visit | Attending: Urology | Admitting: Urology

## 2024-02-19 VITALS — BP 139/69 | HR 82 | Temp 97.8°F | Resp 20 | Ht 65.0 in | Wt 152.8 lb

## 2024-02-19 DIAGNOSIS — I2510 Atherosclerotic heart disease of native coronary artery without angina pectoris: Secondary | ICD-10-CM | POA: Diagnosis not present

## 2024-02-19 DIAGNOSIS — Z79899 Other long term (current) drug therapy: Secondary | ICD-10-CM | POA: Diagnosis not present

## 2024-02-19 DIAGNOSIS — C7951 Secondary malignant neoplasm of bone: Secondary | ICD-10-CM | POA: Diagnosis present

## 2024-02-19 DIAGNOSIS — R160 Hepatomegaly, not elsewhere classified: Secondary | ICD-10-CM | POA: Diagnosis not present

## 2024-02-19 DIAGNOSIS — K219 Gastro-esophageal reflux disease without esophagitis: Secondary | ICD-10-CM | POA: Diagnosis not present

## 2024-02-19 DIAGNOSIS — Z8711 Personal history of peptic ulcer disease: Secondary | ICD-10-CM | POA: Insufficient documentation

## 2024-02-19 DIAGNOSIS — Z8719 Personal history of other diseases of the digestive system: Secondary | ICD-10-CM | POA: Diagnosis not present

## 2024-02-19 DIAGNOSIS — K589 Irritable bowel syndrome without diarrhea: Secondary | ICD-10-CM | POA: Diagnosis not present

## 2024-02-19 DIAGNOSIS — E119 Type 2 diabetes mellitus without complications: Secondary | ICD-10-CM | POA: Diagnosis not present

## 2024-02-19 DIAGNOSIS — I700 Atherosclerosis of aorta: Secondary | ICD-10-CM | POA: Insufficient documentation

## 2024-02-19 DIAGNOSIS — Z7952 Long term (current) use of systemic steroids: Secondary | ICD-10-CM | POA: Insufficient documentation

## 2024-02-19 DIAGNOSIS — C7931 Secondary malignant neoplasm of brain: Secondary | ICD-10-CM | POA: Insufficient documentation

## 2024-02-19 DIAGNOSIS — C50919 Malignant neoplasm of unspecified site of unspecified female breast: Secondary | ICD-10-CM | POA: Insufficient documentation

## 2024-02-19 DIAGNOSIS — F17210 Nicotine dependence, cigarettes, uncomplicated: Secondary | ICD-10-CM | POA: Diagnosis not present

## 2024-02-19 LAB — CANCER ANTIGEN 15-3: CA 15-3: 815 U/mL — ABNORMAL HIGH (ref 0.0–25.0)

## 2024-02-19 LAB — CANCER ANTIGEN 27.29: CA 27.29: 1011.8 U/mL — ABNORMAL HIGH (ref 0.0–38.6)

## 2024-02-19 NOTE — Progress Notes (Signed)
 " Radiation Oncology         [phone] ________________________________  Outpatient Re-Consultation  Name: Monica Soto MRN: [id number]  Date of Service: 02/19/2024 DOB: 11-17-1965  RR:Hnoipwh, Norleen, MD  [name], MD   REFERRING PHYSICIAN: [name], MD  DIAGNOSIS: 59 y/o woman with a painful bony metastasis in the left shoulder secondary to progressive metastatic breast cancer.    ICD-10-CM   1. Metastasis to bone [facility])  C79.51       HISTORY OF PRESENT ILLNESS: Monica Soto is a 59 y.o. female seen at the request of Dr. Davonna.  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  Fortunately, MRI of the total spine at that time was without evidence of leptomeningeal disease but did confirm widespread osseous metastases throughout the cervical, thoracic and lumbar spine without extraosseous or epidural extension and no pathologic compression fractures or other complicating features.  She completed the 2 weeks of palliative whole brain radiation which she tolerated well and has continued on systemic therapy with Enhertu , under the care of Dr. Davonna.  She met with Dr. Buckley on 02/13/2024 and will continue with surveillance MRI brain scans and follow-up under his care with her next scan scheduled for March 2026.  Unfortunately, her most recent restaging PET scan from 02/02/2024 showed continued disease progression.  The plan is to continue Enhertu  and repeat imaging in 3 months.  She has had persistent and progressively worsening pain in the left shoulder over the past 1 to 2 months that corresponds to a site of known osseous metastases.  This was initially well-controlled with Tylenol  as needed but more recently, will wake her from sleep at night and the pain is severe enough to need narcotic pain medication for management.  She has been  using Percocet as needed at night to help her rest but has been referred to us  today to discuss the potential role for palliative radiation to the left shoulder to help with more durable pain control.    PREVIOUS RADIATION THERAPY: Yes  01/01/24 - 01/14/24: The whole brain was treated to 30 Gy in 10 fractions of 3 Gy each.  PAST MEDICAL HISTORY:  Past Medical History:  Diagnosis Date   Depression    Diabetes mellitus without complication (HCC)    Erosive esophagitis 04/26/2010   EGD by Dr. Shaaron, small hiatal hernia   GERD (gastroesophageal reflux disease)    History of stomach ulcers    IBS (irritable bowel syndrome)    Diarrhea predominant   Microscopic colitis 04/26/2010   Colonoscopy by Dr. Shaaron, good response with Entocort   Tubular adenoma of colon 04/26/2010   Junction of descending and sigmoid 40 CM from anus      PAST SURGICAL HISTORY: Past Surgical History:  Procedure Laterality Date   ABDOMINAL HYSTERECTOMY     APPENDECTOMY  2/11   Dr. Floria with a delayed closure   BREAST BIOPSY Right 06/22/2022   US  RT BREAST BX W LOC DEV 1ST LESION IMG BX SPEC US  GUIDE 06/22/2022 [facility] MAMMOGRAPHY   PORTACATH PLACEMENT N/A 07/16/2022   Procedure: INSERTION PORT-A-CATH;  Surgeon: [name], MD;  Location: [facility];  Service: General;  Laterality: N/A;   TONSILLECTOMY     TUBAL LIGATION      FAMILY HISTORY:  Family History  Problem Relation Age of Onset   Diabetes  Mother    Coronary artery disease Mother    Healthy Father     SOCIAL HISTORY:  Social History   Socioeconomic History   Marital status: Married    Spouse name: Not on file   Number of children: Not on file   Years of education: Not on file   Highest education level: Not on file  Occupational History    Employer: [employer]    Comment: Third shift  Tobacco Use   Smoking status: Every Day    Current packs/day: 0.50    Types: Cigarettes   Smokeless tobacco: Never  Vaping Use    Vaping status: Never Used  Substance and Sexual Activity   Alcohol use: Yes    Alcohol/week: 1.0 standard drink of alcohol    Types: 1 Glasses of wine per week    Comment: seldom   Drug use: No   Sexual activity: Yes    Birth control/protection: Surgical  Other Topics Concern   Not on file  Social History Narrative   Not on file   Social Drivers of Health   Tobacco Use: High Risk (12/28/2023)   Patient History    Smoking Tobacco Use: Every Day    Smokeless Tobacco Use: Never    Passive Exposure: Not on file  Financial Resource Strain: Not on file  Food Insecurity: No Food Insecurity (12/29/2023)   Epic    Worried About Programme Researcher, Broadcasting/film/video in the Last Year: Never true    Ran Out of Food in the Last Year: Never true  Transportation Needs: No Transportation Needs (12/29/2023)   Epic    Lack of Transportation (Medical): No    Lack of Transportation (Non-Medical): No  Physical Activity: Not on file  Stress: Not on file  Social Connections: Not on file  Intimate Partner Violence: Not At Risk (12/29/2023)   Epic    Fear of Current or Ex-Partner: No    Emotionally Abused: No    Physically Abused: No    Sexually Abused: No  Depression (PHQ2-9): Low Risk (02/18/2024)   Depression (PHQ2-9)    PHQ-2 Score: 0  Alcohol Screen: Not on file  Housing: Unknown (12/29/2023)   Epic    Unable to Pay for Housing in the Last Year: No    Number of Times Moved in the Last Year: Not on file    Homeless in the Last Year: No  Utilities: Not At Risk (12/29/2023)   Epic    Threatened with loss of utilities: No  Health Literacy: Not on file    ALLERGIES: Keppra  [levetiracetam ]  MEDICATIONS:  Current Outpatient Medications  Medication Sig Dispense Refill   acetaminophen  (TYLENOL ) 500 MG tablet Take 1 tablet (500 mg total) by mouth every 6 (six) hours as needed for mild pain (pain score 1-3), fever or headache. 30 tablet 0   Camphor-Menthol-Methyl Sal (SALONPAS EX) Apply 1 Application  topically daily as needed.     dexamethasone  (DECADRON ) 4 MG tablet Take 2 tablets (8 mg) by mouth daily for 3 days starting the day after chemotherapy. Take with food. 30 tablet 1   diphenoxylate -atropine  (LOMOTIL ) 2.5-0.025 MG tablet Take 2 tablets after first loose stool and 1 after each loose stool thereafter.  Do not exceed 8 tablets in 1 day. 30 tablet 3   doxycycline  (VIBRA -TABS) 100 MG tablet Take 1 tablet (100 mg total) by mouth 2 (two) times daily. 14 tablet 0   fluticasone  (FLONASE ) 50 MCG/ACT nasal spray Place 1 spray into both  nostrils daily. (Patient taking differently: Place 1 spray into both nostrils daily as needed for rhinitis.) 18.2 mL 2   furosemide  (LASIX ) 20 MG tablet TAKE 1 TABLET(20 MG) BY MOUTH DAILY AS NEEDED FOR SWELLING 30 tablet 2   lacosamide  (VIMPAT ) 50 MG TABS tablet Take 1 tablet (50 mg total) by mouth 2 (two) times daily. 60 tablet 2   lidocaine -prilocaine  (EMLA ) cream APPLY TOPICALLY TO THE AFFECTED AREA 1 TIME 30 g 3   LORazepam  (ATIVAN ) 0.5 MG tablet Take 1 tab by mouth 1 hour prior to radiation 10 tablet 0   magnesium  oxide (MAG-OX) 400 (240 Mg) MG tablet Take 1 tablet (400 mg total) by mouth 2 (two) times daily. 60 tablet 3   nicotine  (NICODERM CQ  - DOSED IN MG/24 HOURS) 14 mg/24hr patch Place 1 patch (14 mg total) onto the skin daily.     ondansetron  (ZOFRAN ) 8 MG tablet TAKE 1 TABLET(8 MG) BY MOUTH EVERY 8 HOURS AS NEEDED FOR NAUSEA OR VOMITING. START THIRD DAY AFTER DOXORUBICIN/ CYCLOPHOSPHAMIDE CHEMOTHERAPY 30 tablet 2   oxyCODONE  (OXY IR/ROXICODONE ) 5 MG immediate release tablet Take 1 tablet (5 mg total) by mouth 2 (two) times daily as needed for severe pain (pain score 7-10). 60 tablet 0   pantoprazole  (PROTONIX ) 40 MG tablet Take 1 tablet (40 mg total) by mouth daily. 30 tablet 2   polyethylene glycol (MIRALAX  / GLYCOLAX ) 17 g packet Take 17 g by mouth daily as needed for mild constipation.     prochlorperazine  (COMPAZINE ) 10 MG tablet Take 1 tablet (10  mg total) by mouth every 6 (six) hours as needed for nausea or vomiting. 30 tablet 2   No current facility-administered medications for this encounter.    REVIEW OF SYSTEMS:  On review of systems, the patient reports that she is doing well overall.  Her only complaint is of left shoulder pain that intermittently becomes severe, particularly at night, often waking her from sleep.  She does still have some fatigue that is gradually improving since completing the whole brain radiation and she denies any seizure activity, headaches, changes in visual or auditory acuity, imbalance or dizziness.  She denies any chest pain, shortness of breath, cough, fevers, chills, night sweats, or recent unintended weight changes.  She denies any bowel or bladder disturbances, and denies abdominal pain, nausea or vomiting.  She denies any new musculoskeletal or joint aches or pains. A complete review of systems is obtained and is otherwise negative.    PHYSICAL EXAM:  Wt Readings from Last 3 Encounters:  02/19/24 152 lb 12.8 oz (69.3 kg)  02/18/24 151 lb 3.2 oz (68.6 kg)  02/13/24 150 lb 6.4 oz (68.2 kg)   Temp Readings from Last 3 Encounters:  02/19/24 97.8 F (36.6 C)  02/18/24 (!) 96.9 F (36.1 C) (Oral)  02/18/24 (!) 96.6 F (35.9 C) (Tympanic)   BP Readings from Last 3 Encounters:  02/19/24 139/69  02/18/24 102/68  02/18/24 112/66   Pulse Readings from Last 3 Encounters:  02/19/24 82  02/18/24 71  02/18/24 84   Pain Assessment Pain Score: 9  Pain Loc: Shoulder (left, comes and goes)/10  In general this is a well appearing Caucasian woman in no acute distress.  She's alert and oriented x4 and appropriate throughout the examination. Cardiopulmonary assessment is negative for acute distress and she exhibits normal effort.   KPS = 80  100 - Normal; no complaints; no evidence of disease. 90   - Able to carry on normal  activity; minor signs or symptoms of disease. 80   - Normal activity with  effort; some signs or symptoms of disease. 102   - Cares for self; unable to carry on normal activity or to do active work. 60   - Requires occasional assistance, but is able to care for most of his personal needs. 50   - Requires considerable assistance and frequent medical care. 40   - Disabled; requires special care and assistance. 30   - Severely disabled; hospital admission is indicated although death not imminent. 20   - Very sick; hospital admission necessary; active supportive treatment necessary. 10   - Moribund; fatal processes progressing rapidly. 0     - Dead  Karnofsky DA, Abelmann WH, Craver LS and Burchenal [facility] [phone]) The use of the nitrogen mustards in the palliative treatment of carcinoma: with particular reference to bronchogenic carcinoma Cancer 1 634-56  LABORATORY DATA:  Lab Results  Component Value Date   WBC 4.0 02/18/2024   HGB 10.6 (L) 02/18/2024   HCT 33.1 (L) 02/18/2024   MCV 96.5 02/18/2024   PLT 282 02/18/2024   Lab Results  Component Value Date   NA 139 02/18/2024   K 3.7 02/18/2024   CL 104 02/18/2024   CO2 22 02/18/2024   Lab Results  Component Value Date   ALT 11 02/18/2024   AST 42 (H) 02/18/2024   ALKPHOS 519 (H) 02/18/2024   BILITOT 0.3 02/18/2024     RADIOGRAPHY: NM PET Image Restag (PS) Skull Base To Thigh Result Date: 02/11/2024 CLINICAL DATA:  Subsequent treatment strategy for stage IV breast cancer. Evaluate treatment response. Chemotherapy and radiation therapy in 2025. EXAM: NUCLEAR MEDICINE PET SKULL BASE TO THIGH TECHNIQUE: 8.4 mCi F-18 FDG was injected intravenously. Full-ring PET imaging was performed from the skull base to thigh after the radiotracer. CT data was obtained and used for attenuation correction and anatomic localization. Fasting blood glucose: 80 mg/dl COMPARISON:  88/76/7974 abdominopelvic CT. Most recent PET of 11/21/2023. FINDINGS: Mediastinal blood pool activity: SUV max 1.7 Liver activity: SUV max NA NECK: No areas of  abnormal hypermetabolism. Incidental CT findings: No cervical adenopathy. CHEST: No pulmonary parenchymal or thoracic nodal hypermetabolism. Incidental CT findings: Right Port-A-Cath tip mid right atrium. Aortic and coronary artery calcification. ABDOMEN/PELVIS: Marked progression of bilobar hepatic metastasis. Tumor involves the majority of the left hepatic lobe. Index mass straddling the left and right hepatic lobes measures 5.8 x 3.0 cm and SUV 11.7 on image 107/202. New or markedly progressive since the prior. Index right pericholecystic lesion measures 2.0 cm and SUV 9.6 on image 129/202. Compare 1.5 cm and SUV 5.1 on the prior. 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 Development of hypermetabolic bilateral inguinal adenopathy. Example right inguinal node at 11 mm and SUV 3.3 on image 162/202. Incidental CT findings: Normal adrenal glands. Abdominal aortic atherosclerosis. Pelvic floor laxity. Hysterectomy. Trace pelvic fluid, new. SKELETON: Diffuse osseous metastasis, progressive. An index area of hypermetabolism within the left femoral neck measures SUV 9.4 on image 168/202. Compare SUV 6.5 on the prior. Diffuse left scapular body hypermetabolism measures SUV 11.1 today versus SUV 9.2 on the prior. Lateral right breast hypermetabolic mass measures 3.2 cm and SUV 11.6 on image 87/202. Relatively similar in CT appearance and SUV 5.1 on the prior. Incidental CT findings: None. IMPRESSION: 1. Moderate disease progression. 2. Marked hepatic metastatic disease progression. 3. New hypermetabolic right inguinal nodes, likely metastatic. 4. Mild progression of diffuse osseous metastasis. 5. Right  lateral breast mass, similar in size but increasingly hypermetabolic. 6. Age advanced coronary artery atherosclerosis. Recommend assessment of coronary risk factors. 7.  Aortic Atherosclerosis (ICD10-I70.0). Electronically Signed   By: Rockey Kilts M.D.   On: 02/11/2024 09:32       IMPRESSION/PLAN: 1. 59 y.o. woman with a painful bony metastasis in the left shoulder secondary to progressive metastatic breast cancer.  Today, we talked to the patient and her husband about the findings and workup thus far. We discussed the natural history of metastatic breast cancer and general treatment, highlighting the role of radiotherapy in the management of painful sites of metastasis. We discussed the available radiation techniques, and focused on the details and logistics of delivery.  The recommendation is for a 2-week course of daily external beam radiation to the left shoulder and scapula.  We reviewed the anticipated acute and late sequelae associated with radiation in this setting. The patient was encouraged to ask questions that were answered to her stated satisfaction.  At the conclusion of our conversation, the patient is in agreement to proceed with the recommended 2-week course of daily external beam radiation to the left shoulder and scapula.  She has freely signed written consent to proceed today in the office and a copy of this document will be placed in her medical record.  She will proceed with CT simulation following our visit today, in anticipation of beginning treatment on Thursday, 02/20/2024 so we will share our discussion with Dr. Davonna and proceed with treatment planning accordingly.  She knows that she is welcome to call at anytime in the interim with any questions or concerns related to our discussion today.  We enjoyed meeting with her again today and look forward to continue to participate in her care.  We personally spent 45 minutes in this encounter including chart review, reviewing radiological studies, meeting face-to-face with the patient, entering orders and completing documentation.    Sabra MICAEL Rusk, PA-C    Donnice Barge, MD  [facility]  Radiation Oncology Direct Dial: [phone]  Fax: [phone] [email or web address]  Skype  LinkedIn           "

## 2024-02-19 NOTE — Progress Notes (Signed)
 Histology and Location of Primary Cancer:  CC: Metastatic ER/PR +, HER2-right breast cancer to the bones and liver    Sites of Visceral and Bony Metastatic Disease:  New hypermetabolic skeletal metastasis, left shoulder pain  Location(s) of Symptomatic Metastases: left shoulder  Past/Anticipated chemotherapy by medical oncology, if any:  S/p first-line treatment with anastrozole  and Ribociclib  changed to carboplatin  and Abraxane  at progression in November 2024. Liver biopsy at this time showed low ER positive, PR negative and HER2 low (1+) disease. Started on carboplatin  and Abraxane  and added pembrolizumab  and PD-L1 NGS came back as 10. Progressed on PET scan from 05/2023 and patient was started on Sacituzumab govitecan  on 06/10/2023 Patient had a new onset brain metastasis and progression on PET scan.  Started on Enhertu  01/28/2024.  Pain on a scale of 0-10 is: 9 (left shoulder), comes and goes but worse with daily activities like folding clothes, etc.   If Spine Met(s), symptoms, if any, include: Bowel/Bladder retention or incontinence (please describe): None Numbness or weakness in extremities (please describe): reports some weakness in both legs Current Decadron  regimen, if applicable: 8mg  daily for the next 3 days  Ambulatory status? Walker? Wheelchair?: Ambulatory  SAFETY ISSUES: Prior radiation? no Pacemaker/ICD? No, just a port Possible current pregnancy? No, hysterectomy Is the patient on methotrexate? no  Current Complaints / other details:  just a lot of fatigue and nausea with the chemo, food does not taste good at all so patient tries to eat what she can

## 2024-02-20 ENCOUNTER — Other Ambulatory Visit: Payer: Self-pay

## 2024-02-20 ENCOUNTER — Ambulatory Visit
Admission: RE | Admit: 2024-02-20 | Discharge: 2024-02-20 | Disposition: A | Source: Ambulatory Visit | Attending: Radiation Oncology

## 2024-02-20 ENCOUNTER — Ambulatory Visit (HOSPITAL_COMMUNITY)
Admission: RE | Admit: 2024-02-20 | Discharge: 2024-02-20 | Disposition: A | Discharge status: Home / Self Care | Source: Ambulatory Visit | Attending: Oncology | Admitting: Oncology

## 2024-02-20 DIAGNOSIS — Z170 Estrogen receptor positive status [ER+]: Secondary | ICD-10-CM | POA: Diagnosis not present

## 2024-02-20 DIAGNOSIS — Z0189 Encounter for other specified special examinations: Secondary | ICD-10-CM | POA: Diagnosis not present

## 2024-02-20 DIAGNOSIS — C7951 Secondary malignant neoplasm of bone: Secondary | ICD-10-CM | POA: Diagnosis not present

## 2024-02-20 DIAGNOSIS — C50411 Malignant neoplasm of upper-outer quadrant of right female breast: Secondary | ICD-10-CM | POA: Insufficient documentation

## 2024-02-20 LAB — RAD ONC ARIA SESSION SUMMARY
Course Elapsed Days: 0
Plan Fractions Treated to Date: 1
Plan Prescribed Dose Per Fraction: 3 Gy
Plan Total Fractions Prescribed: 10
Plan Total Prescribed Dose: 30 Gy
Reference Point Dosage Given to Date: 3 Gy
Reference Point Session Dosage Given: 3 Gy
Session Number: 1

## 2024-02-20 LAB — ECHOCARDIOGRAM COMPLETE
Area-P 1/2: 2.99 cm2
Calc EF: 57.5 %
S' Lateral: 2.5 cm
Single Plane A2C EF: 51.8 %
Single Plane A4C EF: 62.1 %

## 2024-02-20 NOTE — Progress Notes (Signed)
*  PRELIMINARY RESULTS* Echocardiogram 2D Echocardiogram has been performed.  Monica Soto 02/20/2024, 11:16 AM

## 2024-02-21 ENCOUNTER — Other Ambulatory Visit: Payer: Self-pay

## 2024-02-21 ENCOUNTER — Ambulatory Visit
Admission: RE | Admit: 2024-02-21 | Discharge: 2024-02-21 | Disposition: A | Discharge status: Home / Self Care | Source: Ambulatory Visit | Attending: Radiation Oncology | Admitting: Radiation Oncology

## 2024-02-21 DIAGNOSIS — C7951 Secondary malignant neoplasm of bone: Secondary | ICD-10-CM | POA: Diagnosis not present

## 2024-02-21 LAB — RAD ONC ARIA SESSION SUMMARY
Course Elapsed Days: 1
Plan Fractions Treated to Date: 2
Plan Prescribed Dose Per Fraction: 3 Gy
Plan Total Fractions Prescribed: 10
Plan Total Prescribed Dose: 30 Gy
Reference Point Dosage Given to Date: 6 Gy
Reference Point Session Dosage Given: 3 Gy
Session Number: 2

## 2024-02-23 NOTE — Progress Notes (Signed)
" °  Radiation Oncology         [phone] ________________________________  Name: Monica Soto MRN: [id number]  Date: 02/19/2024  DOB: 1965-12-11  SIMULATION AND TREATMENT PLANNING NOTE    ICD-10-CM   1. Metastasis to bone [facility])  C79.51       DIAGNOSIS:  59 y/o woman with a painful bony metastasis in the left scapula secondary to progressive metastatic breast cancer.  NARRATIVE:  The patient was brought to the CT Simulation planning suite.  Identity was confirmed.  All relevant records and images related to the planned course of therapy were reviewed.  The patient freely provided informed written consent to proceed with treatment after reviewing the details related to the planned course of therapy. The consent form was witnessed and verified by the simulation staff.  Then, the patient was set-up in a stable reproducible  prone with chicken wing position for radiation therapy.  CT images were obtained.  Surface markings were placed.  The CT images were loaded into the planning software.  Then the target and avoidance structures were contoured.  Treatment planning then occurred.  The radiation prescription was entered and confirmed.  Then, I designed and supervised the construction of multiple medically necessary complex treatment devices.  I have requested : 3D Simulation  I have requested a DVH of the following structures: left lung, right lung, spinal cord and others including the PTV.    SPECIAL TREATMENT PROCEDURE:  The planned course of therapy using radiation constitutes a special treatment procedure. Special care is required in the management of this patient for the following reasons. This treatment constitutes a Special Treatment Procedure for the following reason: due to the complicated body position prone chicken wing, which called for increased physician education and participation with the simulation team.  The special nature of the planned course of radiotherapy will require increased  physician supervision and oversight to ensure patient's safety with optimal treatment outcomes.  This will require extended time and effort from me.   PLAN:  The patient will receive 30 Gy in 10 fractions.  ________________________________  Donnice FELIX Patrcia, M.D.  "

## 2024-02-24 ENCOUNTER — Ambulatory Visit
Admission: RE | Admit: 2024-02-24 | Discharge: 2024-02-24 | Disposition: A | Source: Ambulatory Visit | Attending: Radiation Oncology

## 2024-02-24 ENCOUNTER — Other Ambulatory Visit: Payer: Self-pay

## 2024-02-24 DIAGNOSIS — C7951 Secondary malignant neoplasm of bone: Secondary | ICD-10-CM | POA: Diagnosis not present

## 2024-02-24 LAB — RAD ONC ARIA SESSION SUMMARY
Course Elapsed Days: 4
Plan Fractions Treated to Date: 3
Plan Prescribed Dose Per Fraction: 3 Gy
Plan Total Fractions Prescribed: 10
Plan Total Prescribed Dose: 30 Gy
Reference Point Dosage Given to Date: 9 Gy
Reference Point Session Dosage Given: 3 Gy
Session Number: 3

## 2024-02-25 ENCOUNTER — Other Ambulatory Visit: Payer: Self-pay

## 2024-02-25 ENCOUNTER — Ambulatory Visit
Admission: RE | Admit: 2024-02-25 | Discharge: 2024-02-25 | Disposition: A | Source: Ambulatory Visit | Attending: Radiation Oncology

## 2024-02-25 DIAGNOSIS — C7951 Secondary malignant neoplasm of bone: Secondary | ICD-10-CM | POA: Diagnosis not present

## 2024-02-25 LAB — RAD ONC ARIA SESSION SUMMARY
Course Elapsed Days: 5
Plan Fractions Treated to Date: 4
Plan Prescribed Dose Per Fraction: 3 Gy
Plan Total Fractions Prescribed: 10
Plan Total Prescribed Dose: 30 Gy
Reference Point Dosage Given to Date: 12 Gy
Reference Point Session Dosage Given: 3 Gy
Session Number: 4

## 2024-02-26 ENCOUNTER — Other Ambulatory Visit: Payer: Self-pay

## 2024-02-26 ENCOUNTER — Ambulatory Visit
Admission: RE | Admit: 2024-02-26 | Discharge: 2024-02-26 | Disposition: A | Source: Ambulatory Visit | Attending: Radiation Oncology

## 2024-02-26 DIAGNOSIS — C7951 Secondary malignant neoplasm of bone: Secondary | ICD-10-CM | POA: Diagnosis not present

## 2024-02-26 LAB — RAD ONC ARIA SESSION SUMMARY
Course Elapsed Days: 6
Plan Fractions Treated to Date: 5
Plan Prescribed Dose Per Fraction: 3 Gy
Plan Total Fractions Prescribed: 10
Plan Total Prescribed Dose: 30 Gy
Reference Point Dosage Given to Date: 15 Gy
Reference Point Session Dosage Given: 3 Gy
Session Number: 5

## 2024-02-27 ENCOUNTER — Other Ambulatory Visit: Payer: Self-pay

## 2024-02-27 ENCOUNTER — Ambulatory Visit
Admission: RE | Admit: 2024-02-27 | Discharge: 2024-02-27 | Disposition: A | Discharge status: Home / Self Care | Source: Ambulatory Visit | Attending: Radiation Oncology | Admitting: Radiation Oncology

## 2024-02-27 DIAGNOSIS — C7951 Secondary malignant neoplasm of bone: Secondary | ICD-10-CM | POA: Diagnosis not present

## 2024-02-27 LAB — RAD ONC ARIA SESSION SUMMARY
Course Elapsed Days: 7
Plan Fractions Treated to Date: 6
Plan Prescribed Dose Per Fraction: 3 Gy
Plan Total Fractions Prescribed: 10
Plan Total Prescribed Dose: 30 Gy
Reference Point Dosage Given to Date: 18 Gy
Reference Point Session Dosage Given: 3 Gy
Session Number: 6

## 2024-02-28 ENCOUNTER — Ambulatory Visit
Admission: RE | Admit: 2024-02-28 | Discharge: 2024-02-28 | Disposition: A | Source: Ambulatory Visit | Attending: Radiation Oncology

## 2024-02-28 ENCOUNTER — Other Ambulatory Visit: Payer: Self-pay

## 2024-02-28 DIAGNOSIS — C7951 Secondary malignant neoplasm of bone: Secondary | ICD-10-CM | POA: Diagnosis not present

## 2024-02-28 LAB — RAD ONC ARIA SESSION SUMMARY
Course Elapsed Days: 8
Plan Fractions Treated to Date: 7
Plan Prescribed Dose Per Fraction: 3 Gy
Plan Total Fractions Prescribed: 10
Plan Total Prescribed Dose: 30 Gy
Reference Point Dosage Given to Date: 21 Gy
Reference Point Session Dosage Given: 3 Gy
Session Number: 7

## 2024-03-02 ENCOUNTER — Ambulatory Visit

## 2024-03-03 ENCOUNTER — Ambulatory Visit
Admission: RE | Admit: 2024-03-03 | Discharge: 2024-03-03 | Disposition: A | Source: Ambulatory Visit | Attending: Radiation Oncology

## 2024-03-03 ENCOUNTER — Other Ambulatory Visit: Payer: Self-pay

## 2024-03-03 ENCOUNTER — Other Ambulatory Visit: Payer: Self-pay | Admitting: Oncology

## 2024-03-03 DIAGNOSIS — Z170 Estrogen receptor positive status [ER+]: Secondary | ICD-10-CM

## 2024-03-03 LAB — RAD ONC ARIA SESSION SUMMARY
Course Elapsed Days: 12
Plan Fractions Treated to Date: 8
Plan Prescribed Dose Per Fraction: 3 Gy
Plan Total Fractions Prescribed: 10
Plan Total Prescribed Dose: 30 Gy
Reference Point Dosage Given to Date: 24 Gy
Reference Point Session Dosage Given: 3 Gy
Session Number: 8

## 2024-03-04 ENCOUNTER — Other Ambulatory Visit: Payer: Self-pay

## 2024-03-04 ENCOUNTER — Ambulatory Visit: Admission: RE | Admit: 2024-03-04 | Discharge: 2024-03-04 | Attending: Radiation Oncology

## 2024-03-04 ENCOUNTER — Ambulatory Visit

## 2024-03-04 LAB — RAD ONC ARIA SESSION SUMMARY
Course Elapsed Days: 13
Plan Fractions Treated to Date: 9
Plan Prescribed Dose Per Fraction: 3 Gy
Plan Total Fractions Prescribed: 10
Plan Total Prescribed Dose: 30 Gy
Reference Point Dosage Given to Date: 27 Gy
Reference Point Session Dosage Given: 3 Gy
Session Number: 9

## 2024-03-05 ENCOUNTER — Other Ambulatory Visit: Payer: Self-pay

## 2024-03-05 ENCOUNTER — Ambulatory Visit
Admission: RE | Admit: 2024-03-05 | Discharge: 2024-03-05 | Disposition: A | Discharge status: Home / Self Care | Source: Ambulatory Visit | Attending: Radiation Oncology | Admitting: Radiation Oncology

## 2024-03-05 DIAGNOSIS — C7951 Secondary malignant neoplasm of bone: Secondary | ICD-10-CM

## 2024-03-05 LAB — RAD ONC ARIA SESSION SUMMARY
Course Elapsed Days: 14
Plan Fractions Treated to Date: 10
Plan Prescribed Dose Per Fraction: 3 Gy
Plan Total Fractions Prescribed: 10
Plan Total Prescribed Dose: 30 Gy
Reference Point Dosage Given to Date: 30 Gy
Reference Point Session Dosage Given: 3 Gy
Session Number: 10

## 2024-03-06 ENCOUNTER — Other Ambulatory Visit: Payer: Self-pay

## 2024-03-06 MED ORDER — OXYCODONE HCL 5 MG PO TABS: 5.0000 mg | ORAL_TABLET | Freq: Two times a day (BID) | ORAL | 0 refills | Status: AC | PRN

## 2024-03-06 NOTE — Radiation Completion Notes (Signed)
 Patient Name: Monica Soto, Monica Soto MRN: [id number] Date of Birth: 10/08/1965 Referring Physician: NORLEEN GENERAL, M.D. Date of Service: 2024-03-06 Radiation Oncologist: Donnice Barge, M.D. [facility]                             RADIATION ONCOLOGY END OF TREATMENT NOTE     Diagnosis: C79.51 Secondary malignant neoplasm of bone Staging on 2022-07-04: Malignant neoplasm of upper-outer quadrant of right breast in female, estrogen receptor positive (HCC) T=cT4, N=cN2, M=cM1 Intent: Palliative     ==========DELIVERED PLANS==========  First Treatment Date: 2024-02-20 Last Treatment Date: 2024-03-05   Plan Name: Chest_L_Scap Site: Scapula, Left Technique: 3D Mode: Photon Dose Per Fraction: 3 Gy Prescribed Dose (Delivered / Prescribed): 30 Gy / 30 Gy Prescribed Fxs (Delivered / Prescribed): 10 / 10     ==========ON TREATMENT VISIT DATES========== 2024-02-26, 2024-03-04     ==========UPCOMING VISITS========== 04/13/2024 [facility] EST PT 30 Buckley Arthea POUR, MD  04/07/2024 [facility] ONC POST TREATMENT CALL [facility]  03/31/2024 [facility] INFUSION 2HR (120) [facility] CHAIR 1  03/31/2024 [facility] OFFICE VISIT 20 [name], MD  03/31/2024 [facility] PORT FLUSH W/LAB [facility] NURSE  03/10/2024 [facility] INFUSION 2HR (120) [facility] CHAIR 1  03/10/2024 [facility] PORT FLUSH W/LAB [facility] NURSE        ==========APPENDIX - ON TREATMENT VISIT NOTES==========   See weekly On Treatment Notes in Epic for details in the Media tab (listed as Progress notes on the On Treatment Visit Dates listed above).

## 2024-03-10 ENCOUNTER — Inpatient Hospital Stay

## 2024-03-10 ENCOUNTER — Inpatient Hospital Stay: Admitting: Oncology

## 2024-03-10 ENCOUNTER — Other Ambulatory Visit: Payer: Self-pay | Admitting: *Deleted

## 2024-03-10 ENCOUNTER — Inpatient Hospital Stay: Attending: Hematology and Oncology

## 2024-03-10 VITALS — BP 109/67 | HR 83 | Temp 98.0°F | Resp 18

## 2024-03-10 DIAGNOSIS — C50411 Malignant neoplasm of upper-outer quadrant of right female breast: Secondary | ICD-10-CM

## 2024-03-10 LAB — COMPREHENSIVE METABOLIC PANEL WITH GFR
ALT: 6 U/L (ref 0–44)
AST: 27 U/L (ref 15–41)
Albumin: 3.1 g/dL — ABNORMAL LOW (ref 3.5–5.0)
Alkaline Phosphatase: 508 U/L — ABNORMAL HIGH (ref 38–126)
Anion gap: 17 — ABNORMAL HIGH (ref 5–15)
BUN: 6 mg/dL (ref 6–20)
CO2: 19 mmol/L — ABNORMAL LOW (ref 22–32)
Calcium: 8.1 mg/dL — ABNORMAL LOW (ref 8.9–10.3)
Chloride: 102 mmol/L (ref 98–111)
Creatinine, Ser: 0.51 mg/dL (ref 0.44–1.00)
GFR, Estimated: >60 mL/min
Glucose, Bld: 113 mg/dL — ABNORMAL HIGH (ref 70–99)
Potassium: 3.1 mmol/L — ABNORMAL LOW (ref 3.5–5.1)
Sodium: 137 mmol/L (ref 135–145)
Total Bilirubin: 0.3 mg/dL (ref 0.0–1.2)
Total Protein: 5.6 g/dL — ABNORMAL LOW (ref 6.5–8.1)

## 2024-03-10 LAB — CBC WITH DIFFERENTIAL/PLATELET
Abs Immature Granulocytes: 0.06 10*3/uL (ref 0.00–0.07)
Basophils Absolute: 0 10*3/uL (ref 0.0–0.1)
Basophils Relative: 1 %
Eosinophils Absolute: 0.1 10*3/uL (ref 0.0–0.5)
Eosinophils Relative: 3 %
HCT: 33.5 % — ABNORMAL LOW (ref 36.0–46.0)
Hemoglobin: 10.8 g/dL — ABNORMAL LOW (ref 12.0–15.0)
Immature Granulocytes: 2 %
Lymphocytes Relative: 19 %
Lymphs Abs: 0.7 10*3/uL (ref 0.7–4.0)
MCH: 31.5 pg (ref 26.0–34.0)
MCHC: 32.2 g/dL (ref 30.0–36.0)
MCV: 97.7 fL (ref 80.0–100.0)
Monocytes Absolute: 0.4 10*3/uL (ref 0.1–1.0)
Monocytes Relative: 10 %
Neutro Abs: 2.4 10*3/uL (ref 1.7–7.7)
Neutrophils Relative %: 65 %
Platelets: 241 10*3/uL (ref 150–400)
RBC: 3.43 MIL/uL — ABNORMAL LOW (ref 3.87–5.11)
RDW: 19.7 % — ABNORMAL HIGH (ref 11.5–15.5)
WBC: 3.7 10*3/uL — ABNORMAL LOW (ref 4.0–10.5)
nRBC: 0.5 % — ABNORMAL HIGH (ref 0.0–0.2)

## 2024-03-10 LAB — MAGNESIUM: Magnesium: 1.7 mg/dL (ref 1.7–2.4)

## 2024-03-10 MED ORDER — OLANZAPINE 5 MG PO TABS
5.0000 mg | ORAL_TABLET | Freq: Once | ORAL | Status: AC
  Administered 2024-03-10: 5 mg via ORAL
  Filled 2024-03-10: qty 1

## 2024-03-10 MED ORDER — OLANZAPINE 5 MG PO TABS: 5.0000 mg | ORAL_TABLET | Freq: Every evening | ORAL | Status: DC | PRN

## 2024-03-10 MED ORDER — FAM-TRASTUZUMAB DERUXTECAN-NXKI CHEMO 100 MG IV SOLR
340.0000 mg | Freq: Once | INTRAVENOUS | Status: AC
  Administered 2024-03-10: 340 mg via INTRAVENOUS
  Filled 2024-03-10: qty 17

## 2024-03-10 MED ORDER — ACETAMINOPHEN 325 MG PO TABS
650.0000 mg | ORAL_TABLET | Freq: Once | ORAL | Status: AC
  Administered 2024-03-10: 650 mg via ORAL
  Filled 2024-03-10: qty 2

## 2024-03-10 MED ORDER — DIPHENHYDRAMINE HCL 25 MG PO TABS
50.0000 mg | ORAL_TABLET | Freq: Once | ORAL | Status: AC
  Administered 2024-03-10: 50 mg via ORAL
  Filled 2024-03-10: qty 2

## 2024-03-10 MED ORDER — DEXTROSE 5 % IV SOLN: INTRAVENOUS | Status: DC

## 2024-03-10 MED ORDER — POTASSIUM CHLORIDE CRYS ER 20 MEQ PO TBCR
40.0000 meq | EXTENDED_RELEASE_TABLET | Freq: Once | ORAL | Status: AC
  Administered 2024-03-10: 40 meq via ORAL
  Filled 2024-03-10: qty 2

## 2024-03-10 MED ORDER — PALONOSETRON HCL INJECTION 0.25 MG/5ML
0.2500 mg | Freq: Once | INTRAVENOUS | Status: AC
  Administered 2024-03-10: 0.25 mg via INTRAVENOUS
  Filled 2024-03-10: qty 5

## 2024-03-10 MED ORDER — SODIUM CHLORIDE 0.9 % IV SOLN
150.0000 mg | Freq: Once | INTRAVENOUS | Status: AC
  Administered 2024-03-10: 150 mg via INTRAVENOUS
  Filled 2024-03-10: qty 150

## 2024-03-10 MED ORDER — DEXAMETHASONE SOD PHOSPHATE PF 10 MG/ML IJ SOLN
10.0000 mg | Freq: Once | INTRAMUSCULAR | Status: AC
  Administered 2024-03-10: 10 mg via INTRAVENOUS
  Filled 2024-03-10: qty 1

## 2024-03-10 NOTE — Progress Notes (Signed)
 Patient with complaint of extended nausea post treatments.  Add olanzapine  5 mg orally x 1 to treatment premedication.  Patient with >10% weight loss - new dose of Enhertu  5.4 mg/kg = 340 mg  V.O. Dr Ivery Molt, PharmD

## 2024-03-11 ENCOUNTER — Other Ambulatory Visit: Payer: Self-pay

## 2024-03-11 ENCOUNTER — Other Ambulatory Visit: Payer: Self-pay | Admitting: *Deleted

## 2024-03-11 LAB — CANCER ANTIGEN 15-3: CA 15-3: 587 U/mL — ABNORMAL HIGH (ref 0.0–25.0)

## 2024-03-11 LAB — CANCER ANTIGEN 27.29: CA 27.29: 793.3 U/mL — ABNORMAL HIGH (ref 0.0–38.6)

## 2024-03-11 MED ORDER — OLANZAPINE 5 MG PO TABS: 5.0000 mg | ORAL_TABLET | Freq: Every evening | ORAL | 3 refills | Status: AC | PRN

## 2024-03-11 MED ORDER — OLANZAPINE 5 MG PO TABS: 5.0000 mg | ORAL_TABLET | Freq: Every evening | ORAL | Status: DC | PRN

## 2024-03-12 ENCOUNTER — Telehealth: Payer: Self-pay | Admitting: *Deleted

## 2024-03-12 MED ORDER — LORAZEPAM 0.5 MG PO TABS: ORAL_TABLET | ORAL | 0 refills | Status: AC

## 2024-03-12 NOTE — Addendum Note (Signed)
 Addended by: Estus Krakowski K on: 03/12/2024 04:59 PM   Modules accepted: Orders

## 2024-03-12 NOTE — Telephone Encounter (Signed)
 Patient is concerned about the upcoming MRI due to severe claustrophobia.  She said she took Ativan  0.5 mg for radiation and it wasn't strong enough.  Unsure if she should try a stronger dose or should she be IV sedation.  She would prefer to try stronger med in order to keep MRI close to her home at [facility].  Routing to provider to advise.  If Iv sedation required she would need to be changed to [place] for imaging.

## 2024-03-23 ENCOUNTER — Inpatient Hospital Stay: Admitting: Dietician

## 2024-03-31 ENCOUNTER — Inpatient Hospital Stay: Payer: Self-pay

## 2024-03-31 ENCOUNTER — Inpatient Hospital Stay: Payer: Self-pay | Admitting: Oncology

## 2024-04-07 ENCOUNTER — Ambulatory Visit

## 2024-04-09 ENCOUNTER — Ambulatory Visit (HOSPITAL_COMMUNITY)

## 2024-04-13 ENCOUNTER — Inpatient Hospital Stay: Attending: Hematology and Oncology | Admitting: Internal Medicine

## 2024-04-21 ENCOUNTER — Inpatient Hospital Stay
